# Patient Record
Sex: Female | Born: 1985 | Race: Black or African American | Hispanic: No | Marital: Single | Smoking: Former smoker
Health system: Southern US, Community
[De-identification: ages and names within clinical notes are randomized; demographics above are authoritative.]

## PROBLEM LIST (undated history)

## (undated) ENCOUNTER — Inpatient Hospital Stay (HOSPITAL_COMMUNITY): Payer: Self-pay

## (undated) ENCOUNTER — Inpatient Hospital Stay (HOSPITAL_COMMUNITY): Payer: No Typology Code available for payment source

## (undated) DIAGNOSIS — Z87898 Personal history of other specified conditions: Secondary | ICD-10-CM

## (undated) DIAGNOSIS — D649 Anemia, unspecified: Secondary | ICD-10-CM

## (undated) DIAGNOSIS — Z6791 Unspecified blood type, Rh negative: Secondary | ICD-10-CM

## (undated) DIAGNOSIS — R7989 Other specified abnormal findings of blood chemistry: Secondary | ICD-10-CM

## (undated) DIAGNOSIS — G8929 Other chronic pain: Secondary | ICD-10-CM

## (undated) DIAGNOSIS — F32A Depression, unspecified: Secondary | ICD-10-CM

## (undated) DIAGNOSIS — O139 Gestational [pregnancy-induced] hypertension without significant proteinuria, unspecified trimester: Secondary | ICD-10-CM

## (undated) DIAGNOSIS — E669 Obesity, unspecified: Secondary | ICD-10-CM

## (undated) DIAGNOSIS — Z8719 Personal history of other diseases of the digestive system: Secondary | ICD-10-CM

## (undated) DIAGNOSIS — F1491 Cocaine use, unspecified, in remission: Secondary | ICD-10-CM

## (undated) DIAGNOSIS — F319 Bipolar disorder, unspecified: Secondary | ICD-10-CM

## (undated) DIAGNOSIS — N12 Tubulo-interstitial nephritis, not specified as acute or chronic: Secondary | ICD-10-CM

## (undated) DIAGNOSIS — F329 Major depressive disorder, single episode, unspecified: Secondary | ICD-10-CM

## (undated) DIAGNOSIS — O210 Mild hyperemesis gravidarum: Secondary | ICD-10-CM

## (undated) DIAGNOSIS — B999 Unspecified infectious disease: Secondary | ICD-10-CM

## (undated) DIAGNOSIS — O0990 Supervision of high risk pregnancy, unspecified, unspecified trimester: Secondary | ICD-10-CM

## (undated) DIAGNOSIS — M549 Dorsalgia, unspecified: Secondary | ICD-10-CM

## (undated) DIAGNOSIS — R51 Headache: Secondary | ICD-10-CM

## (undated) DIAGNOSIS — F419 Anxiety disorder, unspecified: Secondary | ICD-10-CM

## (undated) DIAGNOSIS — M419 Scoliosis, unspecified: Secondary | ICD-10-CM

## (undated) DIAGNOSIS — K8020 Calculus of gallbladder without cholecystitis without obstruction: Secondary | ICD-10-CM

## (undated) HISTORY — PX: TONSILLECTOMY AND ADENOIDECTOMY: SHX28

## (undated) HISTORY — DX: Calculus of gallbladder without cholecystitis without obstruction: K80.20

## (undated) HISTORY — DX: Gestational (pregnancy-induced) hypertension without significant proteinuria, unspecified trimester: O13.9

## (undated) HISTORY — DX: Supervision of high risk pregnancy, unspecified, unspecified trimester: O09.90

## (undated) HISTORY — PX: MR WRIST RIGHT: HXRAD1819

## (undated) HISTORY — PX: CHOLECYSTECTOMY: SHX55

## (undated) HISTORY — PX: OTHER SURGICAL HISTORY: SHX169

---
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
## 2006-01-20 HISTORY — PX: DILATION AND CURETTAGE OF UTERUS: SHX78

## 2006-04-04 ENCOUNTER — Inpatient Hospital Stay (HOSPITAL_COMMUNITY): Admission: AD | Admit: 2006-04-04 | Discharge: 2006-04-04 | Payer: Self-pay | Admitting: Obstetrics & Gynecology

## 2006-05-05 ENCOUNTER — Inpatient Hospital Stay (HOSPITAL_COMMUNITY): Admission: AD | Admit: 2006-05-05 | Discharge: 2006-05-06 | Payer: Self-pay | Admitting: Obstetrics & Gynecology

## 2007-03-07 ENCOUNTER — Inpatient Hospital Stay (HOSPITAL_COMMUNITY): Admission: AD | Admit: 2007-03-07 | Discharge: 2007-03-07 | Payer: Self-pay | Admitting: Family Medicine

## 2007-03-09 ENCOUNTER — Inpatient Hospital Stay (HOSPITAL_COMMUNITY): Admission: AD | Admit: 2007-03-09 | Discharge: 2007-03-09 | Payer: Self-pay | Admitting: Obstetrics & Gynecology

## 2007-03-16 ENCOUNTER — Inpatient Hospital Stay (HOSPITAL_COMMUNITY): Admission: AD | Admit: 2007-03-16 | Discharge: 2007-03-16 | Payer: Self-pay | Admitting: Family Medicine

## 2008-04-09 ENCOUNTER — Emergency Department (HOSPITAL_COMMUNITY): Admission: EM | Admit: 2008-04-09 | Discharge: 2008-04-09 | Payer: Self-pay | Admitting: Emergency Medicine

## 2008-04-11 ENCOUNTER — Inpatient Hospital Stay (HOSPITAL_COMMUNITY): Admission: AD | Admit: 2008-04-11 | Discharge: 2008-04-11 | Payer: Self-pay | Admitting: Obstetrics & Gynecology

## 2008-04-11 ENCOUNTER — Ambulatory Visit: Payer: Self-pay | Admitting: Family Medicine

## 2008-04-14 ENCOUNTER — Emergency Department (HOSPITAL_COMMUNITY): Admission: EM | Admit: 2008-04-14 | Discharge: 2008-04-14 | Payer: Self-pay | Admitting: Emergency Medicine

## 2008-05-11 ENCOUNTER — Inpatient Hospital Stay (HOSPITAL_COMMUNITY): Admission: AD | Admit: 2008-05-11 | Discharge: 2008-05-11 | Payer: Self-pay | Admitting: Obstetrics

## 2008-05-23 ENCOUNTER — Inpatient Hospital Stay (HOSPITAL_COMMUNITY): Admission: AD | Admit: 2008-05-23 | Discharge: 2008-05-23 | Payer: Self-pay | Admitting: Obstetrics & Gynecology

## 2008-05-23 ENCOUNTER — Ambulatory Visit: Payer: Self-pay | Admitting: Physician Assistant

## 2008-06-18 ENCOUNTER — Emergency Department (HOSPITAL_COMMUNITY): Admission: EM | Admit: 2008-06-18 | Discharge: 2008-06-18 | Payer: Self-pay | Admitting: Emergency Medicine

## 2008-07-11 ENCOUNTER — Ambulatory Visit (HOSPITAL_COMMUNITY): Admission: RE | Admit: 2008-07-11 | Discharge: 2008-07-11 | Payer: Self-pay | Admitting: Obstetrics

## 2008-08-19 ENCOUNTER — Inpatient Hospital Stay (HOSPITAL_COMMUNITY): Admission: AD | Admit: 2008-08-19 | Discharge: 2008-08-19 | Payer: Self-pay | Admitting: Internal Medicine

## 2008-09-21 ENCOUNTER — Ambulatory Visit (HOSPITAL_COMMUNITY): Admission: AD | Admit: 2008-09-21 | Discharge: 2008-09-21 | Payer: Self-pay | Admitting: Obstetrics

## 2008-10-05 ENCOUNTER — Inpatient Hospital Stay (HOSPITAL_COMMUNITY): Admission: AD | Admit: 2008-10-05 | Discharge: 2008-10-05 | Payer: Self-pay | Admitting: Obstetrics

## 2008-11-17 ENCOUNTER — Inpatient Hospital Stay (HOSPITAL_COMMUNITY): Admission: AD | Admit: 2008-11-17 | Discharge: 2008-11-17 | Payer: Self-pay | Admitting: Obstetrics

## 2008-12-10 ENCOUNTER — Inpatient Hospital Stay (HOSPITAL_COMMUNITY): Admission: AD | Admit: 2008-12-10 | Discharge: 2008-12-10 | Payer: Self-pay | Admitting: Obstetrics & Gynecology

## 2008-12-11 ENCOUNTER — Inpatient Hospital Stay (HOSPITAL_COMMUNITY): Admission: AD | Admit: 2008-12-11 | Discharge: 2008-12-13 | Payer: Self-pay | Admitting: Obstetrics

## 2009-02-17 ENCOUNTER — Emergency Department (HOSPITAL_COMMUNITY): Admission: EM | Admit: 2009-02-17 | Discharge: 2009-02-18 | Payer: Self-pay | Admitting: Emergency Medicine

## 2009-02-17 ENCOUNTER — Emergency Department (HOSPITAL_COMMUNITY): Admission: EM | Admit: 2009-02-17 | Discharge: 2009-02-17 | Payer: Self-pay | Admitting: Emergency Medicine

## 2009-03-03 ENCOUNTER — Emergency Department (HOSPITAL_COMMUNITY): Admission: EM | Admit: 2009-03-03 | Discharge: 2009-03-03 | Payer: Self-pay | Admitting: Emergency Medicine

## 2009-03-29 ENCOUNTER — Emergency Department (HOSPITAL_COMMUNITY): Admission: EM | Admit: 2009-03-29 | Discharge: 2009-03-29 | Payer: Self-pay | Admitting: Emergency Medicine

## 2009-03-30 ENCOUNTER — Emergency Department (HOSPITAL_COMMUNITY): Admission: EM | Admit: 2009-03-30 | Discharge: 2009-03-30 | Payer: Self-pay | Admitting: Emergency Medicine

## 2009-05-06 ENCOUNTER — Inpatient Hospital Stay (HOSPITAL_COMMUNITY): Admission: AD | Admit: 2009-05-06 | Discharge: 2009-05-06 | Payer: Self-pay | Admitting: Family Medicine

## 2009-05-06 ENCOUNTER — Ambulatory Visit: Payer: Self-pay | Admitting: Obstetrics and Gynecology

## 2009-06-13 ENCOUNTER — Inpatient Hospital Stay (HOSPITAL_COMMUNITY): Admission: AD | Admit: 2009-06-13 | Discharge: 2009-06-13 | Payer: Self-pay | Admitting: Obstetrics

## 2009-07-23 ENCOUNTER — Ambulatory Visit: Payer: Self-pay | Admitting: Nurse Practitioner

## 2009-07-23 ENCOUNTER — Inpatient Hospital Stay (HOSPITAL_COMMUNITY): Admission: AD | Admit: 2009-07-23 | Discharge: 2009-07-23 | Payer: Self-pay | Admitting: Obstetrics

## 2009-08-03 ENCOUNTER — Ambulatory Visit (HOSPITAL_COMMUNITY): Admission: RE | Admit: 2009-08-03 | Discharge: 2009-08-03 | Payer: Self-pay | Admitting: Obstetrics

## 2009-12-17 ENCOUNTER — Inpatient Hospital Stay (HOSPITAL_COMMUNITY)
Admission: AD | Admit: 2009-12-17 | Discharge: 2009-12-17 | Payer: Self-pay | Source: Home / Self Care | Admitting: Obstetrics

## 2009-12-21 ENCOUNTER — Inpatient Hospital Stay (HOSPITAL_COMMUNITY)
Admission: AD | Admit: 2009-12-21 | Discharge: 2009-12-21 | Payer: Self-pay | Source: Home / Self Care | Admitting: Obstetrics

## 2009-12-30 ENCOUNTER — Inpatient Hospital Stay (HOSPITAL_COMMUNITY)
Admission: AD | Admit: 2009-12-30 | Discharge: 2010-01-02 | Payer: Self-pay | Source: Home / Self Care | Attending: Obstetrics & Gynecology | Admitting: Obstetrics & Gynecology

## 2009-12-31 ENCOUNTER — Encounter: Payer: Self-pay | Admitting: Obstetrics & Gynecology

## 2010-01-19 ENCOUNTER — Emergency Department (HOSPITAL_COMMUNITY)
Admission: EM | Admit: 2010-01-19 | Discharge: 2010-01-20 | Payer: Self-pay | Source: Home / Self Care | Admitting: Emergency Medicine

## 2010-02-09 ENCOUNTER — Emergency Department (HOSPITAL_COMMUNITY)
Admission: EM | Admit: 2010-02-09 | Discharge: 2010-02-09 | Payer: Medicaid Other | Source: Home / Self Care | Admitting: Emergency Medicine

## 2010-02-12 LAB — URINALYSIS, ROUTINE W REFLEX MICROSCOPIC
Bilirubin Urine: NEGATIVE
Hgb urine dipstick: NEGATIVE
Ketones, ur: NEGATIVE mg/dL
Protein, ur: NEGATIVE mg/dL
Urine Glucose, Fasting: NEGATIVE mg/dL

## 2010-02-12 LAB — DIFFERENTIAL
Basophils Absolute: 0 10*3/uL (ref 0.0–0.1)
Eosinophils Relative: 3 % (ref 0–5)
Lymphocytes Relative: 27 % (ref 12–46)
Neutro Abs: 5.4 10*3/uL (ref 1.7–7.7)

## 2010-02-12 LAB — URINE MICROSCOPIC-ADD ON

## 2010-02-12 LAB — URINE CULTURE: Culture  Setup Time: 201201211732

## 2010-02-12 LAB — COMPREHENSIVE METABOLIC PANEL
ALT: 37 U/L — ABNORMAL HIGH (ref 0–35)
AST: 59 U/L — ABNORMAL HIGH (ref 0–37)
CO2: 24 mEq/L (ref 19–32)
Chloride: 105 mEq/L (ref 96–112)
GFR calc Af Amer: >60 mL/min (ref >60)
GFR calc non Af Amer: >60 mL/min (ref >60)
Sodium: 140 mEq/L (ref 135–145)
Total Bilirubin: 0.3 mg/dL (ref 0.3–1.2)

## 2010-02-12 LAB — CBC
HCT: 36.4 % (ref 36.0–46.0)
RDW: 14.1 % (ref 11.5–15.5)
WBC: 8.6 10*3/uL (ref 4.0–10.5)

## 2010-02-18 ENCOUNTER — Ambulatory Visit (HOSPITAL_COMMUNITY)
Admission: RE | Admit: 2010-02-18 | Discharge: 2010-02-23 | Disposition: A | Discharge status: Home / Self Care | Payer: Medicaid Other | Attending: General Surgery | Admitting: General Surgery

## 2010-02-18 DIAGNOSIS — F17200 Nicotine dependence, unspecified, uncomplicated: Secondary | ICD-10-CM | POA: Insufficient documentation

## 2010-02-18 DIAGNOSIS — K8060 Calculus of gallbladder and bile duct with cholecystitis, unspecified, without obstruction: Secondary | ICD-10-CM | POA: Insufficient documentation

## 2010-02-18 DIAGNOSIS — E669 Obesity, unspecified: Secondary | ICD-10-CM | POA: Insufficient documentation

## 2010-02-18 DIAGNOSIS — K8064 Calculus of gallbladder and bile duct with chronic cholecystitis without obstruction: Secondary | ICD-10-CM | POA: Insufficient documentation

## 2010-02-19 ENCOUNTER — Encounter: Payer: Medicaid Other | Admitting: Gastroenterology

## 2010-02-19 LAB — COMPREHENSIVE METABOLIC PANEL
Albumin: 2.9 g/dL — ABNORMAL LOW (ref 3.5–5.2)
BUN: 8 mg/dL (ref 6–23)
Calcium: 8.8 mg/dL (ref 8.4–10.5)
Creatinine, Ser: 0.74 mg/dL (ref 0.4–1.2)
Potassium: 3.7 mEq/L (ref 3.5–5.1)
Total Protein: 6.3 g/dL (ref 6.0–8.3)

## 2010-02-19 LAB — AMYLASE: Amylase: 32 U/L (ref 0–105)

## 2010-02-20 DIAGNOSIS — K8050 Calculus of bile duct without cholangitis or cholecystitis without obstruction: Secondary | ICD-10-CM

## 2010-02-20 LAB — HEPATIC FUNCTION PANEL
ALT: 160 U/L — ABNORMAL HIGH (ref 0–35)
AST: 149 U/L — ABNORMAL HIGH (ref 0–37)
Albumin: 2.6 g/dL — ABNORMAL LOW (ref 3.5–5.2)
Alkaline Phosphatase: 237 U/L — ABNORMAL HIGH (ref 39–117)
Total Bilirubin: 0.6 mg/dL (ref 0.3–1.2)

## 2010-02-20 NOTE — Op Note (Signed)
NAMESALINA, Jacqueline Clarke        ACCOUNT NO.:  [id number]  MEDICAL RECORD NO.:  [id number]          PATIENT TYPE:  OIB  LOCATION:  1530                         FACILITY:  [facility]  PHYSICIAN:  Anselm Pancoast. Adalynn Corne, M.D.DATE OF BIRTH:  05-Jan-1986  DATE OF PROCEDURE:  02/18/2010 DATE OF DISCHARGE:                              OPERATIVE REPORT   PREOPERATIVE DIAGNOSIS:  Chronic cholecystitis with stones and possible common duct stone.  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  OPERATION:  Laparoscopic cholecystectomy, cholangiogram, and dilatation with the ampulla Fogarty catheter.  ANESTHESIA:  General anesthesia.  HISTORY:  Jacqueline Clarke is a 25 year old female, mother of 2. She is about 6 weeks' postpartum who was seen in the office this past week with the following history.  She said following her first pregnancy which was about 2 years ago, she has had episodes of epigastric pain. Then, she has recently had a second pregnancy, delivering about 8 weeks ago.  She is not nursing. She is still out on maternity leave and had an episode of pain last weekend and not this past 2 days ago, but went to the emergency room where she was evaluated.  They did liver function studies and obtained an ultrasound of the gallbladder.  There was no pericystic fluid but there were numerous stones and sludge within the gallbladder.  She was advised to see me in the office and given appointment, I think I saw her on Wednesday.  On examination in the office, she was not acutely tender but she says she is still having episodes of pain and she will get pain in the epigastric area but she also gets it in the chest and small in the back.  She was not jaundiced and her liver functions test in the emergency room showed a mild bump in the SGOT, SGPT, and I recommended to proceed on with a laparoscopic cholecystectomy and cholangiogram.  Her white count was  not elevated and she first says she was not allergic to any medications but then she is kind of, maybe she had a reaction as a child.  She is 19 months following the first pregnancy and approximately 6 to 7 weeks following the second.  The baby was with her mother in the office and is doing fine and the patient is not nursing.  She is here today.  I did not repeat the liver function studies but she says she has had this pain again over the weekend, not as intense when she went to the emergency room but a similar kind of pain in the back and discomfort after eating. Preoperatively, she was given 400 mg of Cipro as she is possibly allergic to KEFZOL as what she says she had some type of her surgery and had a "reaction and reaction years ago.  She on her cultures of her nose is positive for both MRSA and also Staph aureus, even though she is clinically not having any symptoms regarding her nose.  The patient has PAS stockings.  She weighs about 275 pounds.  She is a large individual, but lot of this is kind of adipose tissue from her recent  pregnancy also.  DESCRIPTION OF PROCEDURE:  The patient was positioned to OR table. Induction of general anesthesia, endotracheal tube, oral tube into the stomach and then the abdomen was prepped with Betadine solution and draped in sterile manner.  I elected to make a little incision above her umbilicus, dissected down in the adipose tissue, identifying the fascia, and then made a little small opening to the fascia which picked up to the left and right sides with Kochers and then carefully entered into the peritoneal cavity.  Pursestring suture of 0 Vicryl placed.  Dr. Ezzard Standing had scrubbed in at this point and then we introduced the camera. The gallbladder itself was collapsed.  You can see the numerous stones within it, but it was not acutely inflamed or distended.  The anatomy is such that you could see the branch of the cystic artery just really  on top of the cystic duct and I elevated it from the cystic duct, doubly clipped it, proximally singly, distally, and then after we encompassed the cystic duct went ahead and divided the artery.  I did put clip across the junction of the cystic duct and gallbladder, made a little opening proximally.  A Cook catheter was introduced into the cystic duct and flows induced after being clipped and then an x-ray was obtained. The bile duct is dilated and I could never get diagonal across the ampulla and we gave [place] some glucagon and still you could not see any diagonal across the ampulla.  Since it looks like it would be fairly easy to get a Fogarty catheter in the distal common bile duct, we used a #4 Fogarty and slipped it in and went past the duodenum easily and then pulled it back, deflated the bleeding and then it slipped across the ampulla.  There were no stones.  It actually came out when we removed the cholangiocatheter. There was little air bubbles and the last injection when we used the magnified views, there was little air bubble in the syringe that I pushed into the duodenum.  We then repeated the cholangiogram.  At this time, you could see the diagonal across the ampulla easily.  It looks like there is 1 stone probably about 2 mm in size that is kind of up in the common bile duct or really hepatic duct and then it actually goes down into the distal common bile duct and then we could never actually see that passing across the ampulla.  The catheter was removed.  In its course, we put the [facility] catheter back in and repeated the cholangiogram after the Fogarty had been removed and we put 3 clips across the cystic duct and divided it.  There was a little posterior branch of the cystic artery was visualized and was doubly clipped proximally and then we used the cautery to free the gallbladder from its bed.  There was good hemostasis.  There was no real spillage of bile except for the  little bit through the cystic duct area and I think I need to repeat her liver test in the morning and then make a decision tomorrow whether she is going to the need an ERCP or not.  Hopefully, she will pass this little stone and liver tests will be normal.  I asked him to culture the bile on the back table after the gallbladder had been removed.  We had placed it into an EndoCatch bag and brought it out without spillage of bile and I am not going to  give her any additional antibiotics unless she is symptomatic.  We put an additional figure-of- eight in the fascia just above the umbilicus, removed the 5-mm ports Dr. Ezzard Standing placed in right upper quadrant under direct vision and then closed the subcutaneous wounds with 4-0 Vicryl, Benzoin and Steri-Strips on the skin.     Anselm Pancoast. Zachery Dakins, M.D.     WJW/MEDQ  D:  02/18/2010  T:  02/18/2010  Job:  [id number]  Electronically Signed by Consuello Bossier M.D. on 02/20/2010 09:34:12 AM

## 2010-02-21 DIAGNOSIS — K8050 Calculus of bile duct without cholangitis or cholecystitis without obstruction: Secondary | ICD-10-CM

## 2010-02-21 LAB — WOUND CULTURE: Culture: NO GROWTH

## 2010-02-21 LAB — CBC
MCHC: 30.3 g/dL (ref 30.0–36.0)
MCV: 88.3 fL (ref 78.0–100.0)
Platelets: 269 10*3/uL (ref 150–400)
RDW: 14.4 % (ref 11.5–15.5)
WBC: 5.4 10*3/uL (ref 4.0–10.5)

## 2010-02-21 LAB — BASIC METABOLIC PANEL
BUN: 4 mg/dL — ABNORMAL LOW (ref 6–23)
CO2: 27 mEq/L (ref 19–32)
Chloride: 107 mEq/L (ref 96–112)
Creatinine, Ser: 0.65 mg/dL (ref 0.4–1.2)

## 2010-02-21 LAB — HEPATIC FUNCTION PANEL
ALT: 120 U/L — ABNORMAL HIGH (ref 0–35)
Alkaline Phosphatase: 210 U/L — ABNORMAL HIGH (ref 39–117)
Indirect Bilirubin: 0.1 mg/dL — ABNORMAL LOW (ref 0.3–0.9)
Total Bilirubin: 0.2 mg/dL — ABNORMAL LOW (ref 0.3–1.2)

## 2010-02-27 NOTE — Procedures (Signed)
Summary: ERCP  Patient: Jacqueline Clarke Note: All result statuses are Final unless otherwise noted.  Tests: (1) ERCP (ERC)   ERC ERCP                  DONE     [facility]     [address] [place], [place]  [address]           ERCP PROCEDURE REPORT           PATIENT:  Jacqueline, Clarke  MR#:  [id number]     BIRTHDATE:  Oct 12, 1985  GENDER:           ENDOSCOPIST:  Barbette Hair. Arlyce Dice, MD     ASSISTANT:           PROCEDURE DATE:  02/19/2010     PROCEDURE:  ERCP with removal of stones, ERCP with sphincterotomy           INDICATIONS:  suspected stone           MEDICATIONS:   MAC sedation, administered by CRNA     TOPICAL ANESTHETIC:           DESCRIPTION OF PROCEDURE:   After the risks benefits and     alternatives of the procedure were thoroughly explained, informed     consent was obtained.  The  endoscope was introduced through the     mouth and advanced to the .           The pancreatic duct was successfully cannulated and filled with     contrast. Care was taken not to overfill the duct. pancreatic duct     was cannulated with a 0.37mm wire. No contrast was injected into     the duct. A single stone was founCBD was selectively cannulated     with a 0.45mm wire. No pbvious filling defects were seen. A 12mm     sphincterotomy was done. The duct was swept with a 12mm balloon     stone extractor and a 5mm stone was delivered into the duodenum.     Futhernsweeping of the duct did not yield any more stones.    The     scope was then completely withdrawn from the patient and the     procedure terminated.           COMPLICATIONS:  None           ENDOSCOPIC IMPRESSION:     1) CBD stone, extracted following sphincterotomy           RECOMMENDATIONS:No further Rx           ______________________________     Barbette Hair. Arlyce Dice, MD           CC:  Consuello Bossier, MD           n.     Rosalie DoctorBarbette Hair. Kaplan at 02/19/2010 09:02 PM        Jacqueline Clarke, [id number]  Note: An exclamation mark (!) indicates a result that was not dispersed into the flowsheet. Document Creation Date: 02/19/2010 9:02 PM _______________________________________________________________________  (1) Order result status: Final Collection or observation date-time: 02/19/2010 14:47 Requested date-time:  Receipt date-time:  Reported date-time:  Referring Physician:   Ordering Physician: Melvia Heaps [id number]) Specimen Source:  Source: Launa Grill Order Number: [id number] Lab site:

## 2010-03-14 NOTE — Discharge Summary (Signed)
Jacqueline Clarke, Jacqueline Clarke        ACCOUNT NO.:  [id number]  MEDICAL RECORD NO.:  [id number]           PATIENT TYPE:  O  LOCATION:  1530                         FACILITY:  [facility]  PHYSICIAN:  Anselm Pancoast. Lorilee Cafarella, M.D.DATE OF BIRTH:  04-13-1985  DATE OF ADMISSION:  02/18/2010 DATE OF DISCHARGE:  02/22/2010                              DISCHARGE SUMMARY   DISCHARGING DIAGNOSES: 1. Chronic cholecystitis with stones and common duct stone. 2. Recent vaginal delivery.  OPERATIONS:  Laparoscopic cholecystectomy with cholangiogram.  On the first postop day, ERCP and sphincterotomy and removal of the common duct stone.  HISTORY:  Jacqueline Clarke is a 25 year old female mother of two who is approximately 6-7 weeks postpartum who was seen in the emergency room of the previous weekend that I saw her with severe epigastric pain.  She said she had had previous little episodes of epigastric pain following her first pregnancy (her first child is about, I think, 61 years old) and she went to the emergency room.  She was evaluated.  They did liver function studies which showed sludge and numerous stones within her gallbladder.  Her SGOT and SGPT were mildly elevated, and I saw her in the office (I think it was on either the Thursday or Friday following her visit to the ER) and she had been on tramadol and Percocet for pain. I arranged to get her on the OR schedule the next available day, which was the following Monday approximately 3 days later.  She had had episodes of mild pain over the weekend, but did not repeat the liver function studies, but she was taken to surgery on Monday and Dr. Ezzard Standing assisted.  She is possibly allergic to KEFZOL, so she was given Cipro. On her preoperative screening, her nose culture was positive for Staph aureus and MRSA.  She was taken to surgery and her laparoscopic cholecystectomy.  The gallbladder had numerous small stones within it. On the original x-rays,  we could not get flowing into the duodenum and I used a #4 biliary Fogarty and slipped it through the ampulla,  kind of stretched it, and the x-ray then you could see dye going into the duodenum; but it looked like there was a single stone up in the hepatic portion of the common bile duct.  We watched this and it went on down, but it did not look like it passed; and whether it would actually pass or not, I was not sure.  She had probably a little more pain postoperatively than usual even though her vital signs were normal, and then the following morning, repeated the liver function studies and they were still mildly abnormal and I called Dr. Arlyce Dice at [facility] and arranged for an ERCP to be performed that day.  The ERCP was performed, with a single 5-mm stone ( I expect it was probably more a 4-mm stone from the size of the bile duct and what we saw at the time of surgery). Postoperatively, she complained of more pain than usual, but her amylase on the day after surgery and also the days following the ERCP havealways been normal.  We have allowed her to request the  pain; she was started off with some morphine and, because of discomfort, she had some Dilaudid and then over the last 24 hours we resumed the Percocet, which she has tolerated.  She had an episode of nausea last night.  I did not check any laboratory studies today, and she had a slow pulse of 60-70 range.  Her blood pressure has been normal, she has been afebrile.  On examination of her abdomen now, the incisions from the surgery were healing nicely.  She is not tender.  She has got good bowel sounds and her lungs were clear.  She thinks she is ready for home and I agree, but I would recommend that we only use one Percocet for pain at a time and not to take the tramadol and the Percocet, which she was taking prior to her coming to the hospital.  She will be seen in the office for a follow-up exam and liver function studies in  approximately 1 week.  She was not seen by the GI people this morning and they thought she was ready for discharge yesterday, and she says she is in agreement that she is ready for discharge today.  She did have a Foley catheter placed about 2 days ago when she was not making a whole lot of urine.  With the present 12 hours, she is making large amounts of urine.  Her electrolytes, BUN are normal, as was her glucose.  The cultures:  When she was seen in the ER they did a urine culture that showed multiple organisms of small numbers.  I cultured the bile at the time of her surgery and it was no growth.  We will plan on seeing her in the office in approximately 1 week and she should be able to care for her baby as far as weight restrictions, lifting, and all without issues.  Her baby and husband have been staying with her the last 2-3 nights and she has been room isolation because of the nose cultures.  She had Cipro at the time of surgery.  I think Dr. Arlyce Dice gave a second dose of antibiotics, but I think she actually had amoxicillin, I think, at that time.     Anselm Pancoast. Zachery Dakins, M.D.     WJW/MEDQ  D:  02/22/2010  T:  02/22/2010  Job:  [id number]  cc:   Barbette Hair. Arlyce Dice, MD,FACG [address] [place] [address]  Electronically Signed by Consuello Bossier M.D. on 03/14/2010 09:41:33 AM

## 2010-04-01 LAB — COMPREHENSIVE METABOLIC PANEL
AST: 173 U/L — ABNORMAL HIGH (ref 0–37)
Albumin: 3.1 g/dL — ABNORMAL LOW (ref 3.5–5.2)
Alkaline Phosphatase: 111 U/L (ref 39–117)
CO2: 27 mEq/L (ref 19–32)
Chloride: 106 mEq/L (ref 96–112)
GFR calc Af Amer: >60 mL/min (ref >60)
GFR calc non Af Amer: >60 mL/min (ref >60)
Potassium: 4 mEq/L (ref 3.5–5.1)
Total Bilirubin: 0.7 mg/dL (ref 0.3–1.2)

## 2010-04-01 LAB — URINALYSIS, ROUTINE W REFLEX MICROSCOPIC
Bilirubin Urine: NEGATIVE
Nitrite: NEGATIVE
Protein, ur: 30 mg/dL — AB
Specific Gravity, Urine: 1.014 (ref 1.005–1.030)
Urobilinogen, UA: 0.2 mg/dL (ref 0.0–1.0)

## 2010-04-01 LAB — RH IMMUNE GLOB WKUP(>/=20WKS)(NOT WOMEN'S HOSP)

## 2010-04-01 LAB — CBC
Hemoglobin: 10.8 g/dL — ABNORMAL LOW (ref 12.0–15.0)
MCHC: 33.1 g/dL (ref 30.0–36.0)
MCV: 86.4 fL (ref 78.0–100.0)
Platelets: 312 10*3/uL (ref 150–400)
Platelets: 171 10*3/uL (ref 150–400)
Platelets: 207 10*3/uL (ref 150–400)
RBC: 3.98 MIL/uL (ref 3.87–5.11)
RBC: 3.25 MIL/uL — ABNORMAL LOW (ref 3.87–5.11)
RDW: 14.9 % (ref 11.5–15.5)
RDW: 15.1 % (ref 11.5–15.5)
WBC: 8.2 10*3/uL (ref 4.0–10.5)
WBC: 14.3 10*3/uL — ABNORMAL HIGH (ref 4.0–10.5)
WBC: 9.1 10*3/uL (ref 4.0–10.5)

## 2010-04-01 LAB — WET PREP, GENITAL
Trich, Wet Prep: NONE SEEN
Yeast Wet Prep HPF POC: NONE SEEN

## 2010-04-01 LAB — URINE MICROSCOPIC-ADD ON

## 2010-04-01 LAB — DIFFERENTIAL
Basophils Absolute: 0 10*3/uL (ref 0.0–0.1)
Basophils Relative: 0 % (ref 0–1)
Eosinophils Absolute: 0.1 10*3/uL (ref 0.0–0.7)
Eosinophils Relative: 2 % (ref 0–5)
Monocytes Absolute: 0.7 10*3/uL (ref 0.1–1.0)

## 2010-04-01 LAB — POCT PREGNANCY, URINE: Preg Test, Ur: NEGATIVE

## 2010-04-01 LAB — LIPASE, BLOOD: Lipase: 24 U/L (ref 11–59)

## 2010-04-02 LAB — RH IMMUNE GLOBULIN WORKUP (NOT WOMEN'S HOSP)
ABO/RH(D): O NEG
Unit division: 0

## 2010-04-07 LAB — URINE MICROSCOPIC-ADD ON

## 2010-04-07 LAB — URINALYSIS, ROUTINE W REFLEX MICROSCOPIC
Bilirubin Urine: NEGATIVE
Glucose, UA: NEGATIVE mg/dL
Ketones, ur: NEGATIVE mg/dL
Leukocytes, UA: NEGATIVE
Nitrite: NEGATIVE
Protein, ur: NEGATIVE mg/dL
Specific Gravity, Urine: 1.023 (ref 1.005–1.030)
pH: 5.5 (ref 5.0–8.0)
pH: 6 (ref 5.0–8.0)

## 2010-04-07 LAB — CBC
HCT: 32.7 % — ABNORMAL LOW (ref 36.0–46.0)
Hemoglobin: 10.9 g/dL — ABNORMAL LOW (ref 12.0–15.0)
MCHC: 33.5 g/dL (ref 30.0–36.0)
MCHC: 32.4 g/dL (ref 30.0–36.0)
MCV: 84.3 fL (ref 78.0–100.0)
RBC: 3.9 MIL/uL (ref 3.87–5.11)
RBC: 4.05 MIL/uL (ref 3.87–5.11)
RDW: 14.9 % (ref 11.5–15.5)
WBC: 6.7 10*3/uL (ref 4.0–10.5)

## 2010-04-07 LAB — COMPREHENSIVE METABOLIC PANEL
ALT: 22 U/L (ref 0–35)
AST: 52 U/L — ABNORMAL HIGH (ref 0–37)
AST: 67 U/L — ABNORMAL HIGH (ref 0–37)
Alkaline Phosphatase: 52 U/L (ref 39–117)
CO2: 23 mEq/L (ref 19–32)
CO2: 27 mEq/L (ref 19–32)
Calcium: 8.8 mg/dL (ref 8.4–10.5)
Calcium: 9.3 mg/dL (ref 8.4–10.5)
Chloride: 103 mEq/L (ref 96–112)
Creatinine, Ser: 0.84 mg/dL (ref 0.4–1.2)
GFR calc Af Amer: >60 mL/min (ref >60)
GFR calc Af Amer: >60 mL/min (ref >60)
GFR calc non Af Amer: >60 mL/min (ref >60)
GFR calc non Af Amer: >60 mL/min (ref >60)
Glucose, Bld: 84 mg/dL (ref 70–99)
Glucose, Bld: 92 mg/dL (ref 70–99)
Potassium: 3.7 mEq/L (ref 3.5–5.1)
Sodium: 134 mEq/L — ABNORMAL LOW (ref 135–145)
Total Protein: 6.7 g/dL (ref 6.0–8.3)

## 2010-04-07 LAB — URINE CULTURE

## 2010-04-07 LAB — WET PREP, GENITAL
Trich, Wet Prep: NONE SEEN
Yeast Wet Prep HPF POC: NONE SEEN

## 2010-04-07 LAB — DIFFERENTIAL
Lymphocytes Relative: 33 % (ref 12–46)
Lymphs Abs: 2.2 10*3/uL (ref 0.7–4.0)
Monocytes Relative: 7 % (ref 3–12)
Neutrophils Relative %: 56 % (ref 43–77)

## 2010-04-07 LAB — LIPASE, BLOOD: Lipase: 30 U/L (ref 11–59)

## 2010-04-07 LAB — AMYLASE: Amylase: 66 U/L (ref 0–105)

## 2010-04-07 LAB — GC/CHLAMYDIA PROBE AMP, GENITAL: GC Probe Amp, Genital: NEGATIVE

## 2010-04-08 LAB — URINALYSIS, ROUTINE W REFLEX MICROSCOPIC
Hgb urine dipstick: NEGATIVE
Nitrite: NEGATIVE
Specific Gravity, Urine: >1.03 — ABNORMAL HIGH (ref 1.005–1.030)
Urobilinogen, UA: 0.2 mg/dL (ref 0.0–1.0)

## 2010-04-08 LAB — URINE MICROSCOPIC-ADD ON

## 2010-04-09 LAB — CBC
MCV: 82.8 fL (ref 78.0–100.0)
Platelets: 235 10*3/uL (ref 150–400)
RDW: 14 % (ref 11.5–15.5)
WBC: 4.6 10*3/uL (ref 4.0–10.5)

## 2010-04-09 LAB — HCG, QUANTITATIVE, PREGNANCY: hCG, Beta Chain, Quant, S: 24036 m[IU]/mL — ABNORMAL HIGH (ref <5)

## 2010-04-09 LAB — URINALYSIS, ROUTINE W REFLEX MICROSCOPIC
Glucose, UA: NEGATIVE mg/dL
Hgb urine dipstick: NEGATIVE
Ketones, ur: 40 mg/dL — AB
Protein, ur: NEGATIVE mg/dL

## 2010-04-09 LAB — URINE MICROSCOPIC-ADD ON

## 2010-04-09 LAB — ABO/RH: ABO/RH(D): O NEG

## 2010-04-09 LAB — WET PREP, GENITAL

## 2010-04-09 LAB — GC/CHLAMYDIA PROBE AMP, GENITAL: Chlamydia, DNA Probe: NEGATIVE

## 2010-04-10 LAB — URINALYSIS, ROUTINE W REFLEX MICROSCOPIC
Glucose, UA: NEGATIVE mg/dL
Ketones, ur: NEGATIVE mg/dL
Specific Gravity, Urine: 1.028 (ref 1.005–1.030)
pH: 6 (ref 5.0–8.0)

## 2010-04-10 LAB — WET PREP, GENITAL
WBC, Wet Prep HPF POC: NONE SEEN
Yeast Wet Prep HPF POC: NONE SEEN

## 2010-04-10 LAB — GC/CHLAMYDIA PROBE AMP, GENITAL
Chlamydia, DNA Probe: NEGATIVE
GC Probe Amp, Genital: NEGATIVE

## 2010-04-14 LAB — URINALYSIS, ROUTINE W REFLEX MICROSCOPIC
Bilirubin Urine: NEGATIVE
Glucose, UA: NEGATIVE mg/dL
Hgb urine dipstick: NEGATIVE
Ketones, ur: 40 mg/dL — AB
Nitrite: NEGATIVE
Specific Gravity, Urine: 1.024 (ref 1.005–1.030)
pH: 6 (ref 5.0–8.0)

## 2010-04-24 LAB — COMPREHENSIVE METABOLIC PANEL
ALT: 10 U/L (ref 0–35)
Alkaline Phosphatase: 192 U/L — ABNORMAL HIGH (ref 39–117)
BUN: 7 mg/dL (ref 6–23)
Chloride: 106 mEq/L (ref 96–112)
Glucose, Bld: 84 mg/dL (ref 70–99)
Potassium: 4.1 mEq/L (ref 3.5–5.1)
Sodium: 134 mEq/L — ABNORMAL LOW (ref 135–145)
Total Bilirubin: 0.3 mg/dL (ref 0.3–1.2)
Total Protein: 6.3 g/dL (ref 6.0–8.3)

## 2010-04-24 LAB — LACTATE DEHYDROGENASE: LDH: 148 U/L (ref 94–250)

## 2010-04-24 LAB — CBC
HCT: 32.3 % — ABNORMAL LOW (ref 36.0–46.0)
Hemoglobin: 10.5 g/dL — ABNORMAL LOW (ref 12.0–15.0)
Hemoglobin: 9.6 g/dL — ABNORMAL LOW (ref 12.0–15.0)
MCHC: 32.3 g/dL (ref 30.0–36.0)
MCV: 86.2 fL (ref 78.0–100.0)
RBC: 3.46 MIL/uL — ABNORMAL LOW (ref 3.87–5.11)
RBC: 3.78 MIL/uL — ABNORMAL LOW (ref 3.87–5.11)
RDW: 14.3 % (ref 11.5–15.5)
RDW: 14.5 % (ref 11.5–15.5)
WBC: 7.3 10*3/uL (ref 4.0–10.5)

## 2010-04-24 LAB — URINALYSIS, ROUTINE W REFLEX MICROSCOPIC
Bilirubin Urine: NEGATIVE
Glucose, UA: NEGATIVE mg/dL
Specific Gravity, Urine: 1.02 (ref 1.005–1.030)
pH: 7 (ref 5.0–8.0)

## 2010-04-24 LAB — URIC ACID: Uric Acid, Serum: 5.3 mg/dL (ref 2.4–7.0)

## 2010-04-24 LAB — URINE MICROSCOPIC-ADD ON

## 2010-04-28 LAB — WET PREP, GENITAL
Clue Cells Wet Prep HPF POC: NONE SEEN
Trich, Wet Prep: NONE SEEN

## 2010-04-28 LAB — URINALYSIS, ROUTINE W REFLEX MICROSCOPIC
Glucose, UA: NEGATIVE mg/dL
Ketones, ur: 15 mg/dL — AB
Protein, ur: NEGATIVE mg/dL
Urobilinogen, UA: 0.2 mg/dL (ref 0.0–1.0)

## 2010-04-28 LAB — GC/CHLAMYDIA PROBE AMP, GENITAL: Chlamydia, DNA Probe: NEGATIVE

## 2010-04-28 LAB — URINE MICROSCOPIC-ADD ON

## 2010-04-30 LAB — URINALYSIS, ROUTINE W REFLEX MICROSCOPIC
Glucose, UA: NEGATIVE mg/dL
Leukocytes, UA: NEGATIVE
Specific Gravity, Urine: >1.03 — ABNORMAL HIGH (ref 1.005–1.030)

## 2010-04-30 LAB — URINE MICROSCOPIC-ADD ON

## 2010-04-30 LAB — CBC
HCT: 35.3 % — ABNORMAL LOW (ref 36.0–46.0)
MCHC: 34 g/dL (ref 30.0–36.0)
Platelets: 200 10*3/uL (ref 150–400)
RDW: 13.5 % (ref 11.5–15.5)

## 2010-04-30 LAB — GC/CHLAMYDIA PROBE AMP, GENITAL
Chlamydia, DNA Probe: NEGATIVE
GC Probe Amp, Genital: NEGATIVE

## 2010-04-30 LAB — TROPONIN I: Troponin I: 0.02 ng/mL (ref 0.00–0.06)

## 2010-04-30 LAB — CK TOTAL AND CKMB (NOT AT ARMC)
CK, MB: 0.6 ng/mL (ref 0.3–4.0)
Relative Index: INVALID (ref 0.0–2.5)

## 2010-04-30 LAB — WET PREP, GENITAL

## 2010-05-01 LAB — WET PREP, GENITAL
Trich, Wet Prep: NONE SEEN
Yeast Wet Prep HPF POC: NONE SEEN

## 2010-05-01 LAB — URINE MICROSCOPIC-ADD ON

## 2010-05-01 LAB — URINALYSIS, ROUTINE W REFLEX MICROSCOPIC
Bilirubin Urine: NEGATIVE
Nitrite: NEGATIVE
Protein, ur: NEGATIVE mg/dL
Specific Gravity, Urine: >1.03 — ABNORMAL HIGH (ref 1.005–1.030)
Urobilinogen, UA: 0.2 mg/dL (ref 0.0–1.0)

## 2010-05-01 LAB — CBC
HCT: 33.8 % — ABNORMAL LOW (ref 36.0–46.0)
Hemoglobin: 11.4 g/dL — ABNORMAL LOW (ref 12.0–15.0)
MCV: 87.6 fL (ref 78.0–100.0)
Platelets: 190 10*3/uL (ref 150–400)
RBC: 3.86 MIL/uL — ABNORMAL LOW (ref 3.87–5.11)
WBC: 6.6 10*3/uL (ref 4.0–10.5)

## 2010-05-01 LAB — GC/CHLAMYDIA PROBE AMP, GENITAL: Chlamydia, DNA Probe: NEGATIVE

## 2010-05-02 LAB — RHOGAM INJECTION

## 2010-05-02 LAB — URINALYSIS, ROUTINE W REFLEX MICROSCOPIC
Bilirubin Urine: NEGATIVE
Bilirubin Urine: NEGATIVE
Glucose, UA: NEGATIVE mg/dL
Hgb urine dipstick: NEGATIVE
Ketones, ur: NEGATIVE mg/dL
Nitrite: NEGATIVE
Protein, ur: NEGATIVE mg/dL
Specific Gravity, Urine: 1.018 (ref 1.005–1.030)
Urobilinogen, UA: 0.2 mg/dL (ref 0.0–1.0)
pH: 6 (ref 5.0–8.0)

## 2010-05-02 LAB — WET PREP, GENITAL: Yeast Wet Prep HPF POC: NONE SEEN

## 2010-05-02 LAB — HCG, QUANTITATIVE, PREGNANCY
hCG, Beta Chain, Quant, S: 10507 m[IU]/mL — ABNORMAL HIGH (ref <5)
hCG, Beta Chain, Quant, S: 32728 m[IU]/mL — ABNORMAL HIGH (ref <5)
hCG, Beta Chain, Quant, S: 17567 m[IU]/mL — ABNORMAL HIGH (ref <5)

## 2010-05-02 LAB — URINE CULTURE

## 2010-05-02 LAB — URINE MICROSCOPIC-ADD ON

## 2010-05-02 LAB — GC/CHLAMYDIA PROBE AMP, GENITAL: Chlamydia, DNA Probe: POSITIVE — AB

## 2010-05-02 LAB — POCT PREGNANCY, URINE: Preg Test, Ur: POSITIVE

## 2010-05-02 LAB — ABO/RH: ABO/RH(D): O NEG

## 2010-07-29 ENCOUNTER — Inpatient Hospital Stay (INDEPENDENT_AMBULATORY_CARE_PROVIDER_SITE_OTHER)
Admission: RE | Admit: 2010-07-29 | Discharge: 2010-07-29 | Disposition: A | Discharge status: Home / Self Care | Payer: Self-pay | Source: Ambulatory Visit | Attending: Family Medicine | Admitting: Family Medicine

## 2010-07-29 DIAGNOSIS — S61209A Unspecified open wound of unspecified finger without damage to nail, initial encounter: Secondary | ICD-10-CM

## 2010-08-19 ENCOUNTER — Emergency Department (HOSPITAL_COMMUNITY): Payer: Medicaid Other

## 2010-08-19 ENCOUNTER — Emergency Department (HOSPITAL_COMMUNITY)
Admission: EM | Admit: 2010-08-19 | Discharge: 2010-08-19 | Disposition: A | Discharge status: Home / Self Care | Payer: Medicaid Other | Attending: Emergency Medicine | Admitting: Emergency Medicine

## 2010-08-19 DIAGNOSIS — R070 Pain in throat: Secondary | ICD-10-CM | POA: Insufficient documentation

## 2010-08-19 DIAGNOSIS — R0602 Shortness of breath: Secondary | ICD-10-CM | POA: Insufficient documentation

## 2010-08-19 DIAGNOSIS — N390 Urinary tract infection, site not specified: Secondary | ICD-10-CM | POA: Insufficient documentation

## 2010-08-19 DIAGNOSIS — J45909 Unspecified asthma, uncomplicated: Secondary | ICD-10-CM | POA: Insufficient documentation

## 2010-08-19 DIAGNOSIS — R079 Chest pain, unspecified: Secondary | ICD-10-CM | POA: Insufficient documentation

## 2010-08-19 DIAGNOSIS — R0989 Other specified symptoms and signs involving the circulatory and respiratory systems: Secondary | ICD-10-CM | POA: Insufficient documentation

## 2010-08-19 DIAGNOSIS — R05 Cough: Secondary | ICD-10-CM | POA: Insufficient documentation

## 2010-08-19 DIAGNOSIS — J3489 Other specified disorders of nose and nasal sinuses: Secondary | ICD-10-CM | POA: Insufficient documentation

## 2010-08-19 DIAGNOSIS — R059 Cough, unspecified: Secondary | ICD-10-CM | POA: Insufficient documentation

## 2010-08-19 DIAGNOSIS — R350 Frequency of micturition: Secondary | ICD-10-CM | POA: Insufficient documentation

## 2010-08-19 DIAGNOSIS — R0609 Other forms of dyspnea: Secondary | ICD-10-CM | POA: Insufficient documentation

## 2010-08-19 DIAGNOSIS — R300 Dysuria: Secondary | ICD-10-CM | POA: Insufficient documentation

## 2010-08-19 LAB — URINALYSIS, ROUTINE W REFLEX MICROSCOPIC
Hgb urine dipstick: NEGATIVE
Nitrite: NEGATIVE
Protein, ur: NEGATIVE mg/dL
Specific Gravity, Urine: 1.027 (ref 1.005–1.030)
Urobilinogen, UA: 0.2 mg/dL (ref 0.0–1.0)

## 2010-08-19 LAB — URINE MICROSCOPIC-ADD ON

## 2010-08-19 LAB — POCT PREGNANCY, URINE: Preg Test, Ur: NEGATIVE

## 2010-10-11 LAB — URINALYSIS, ROUTINE W REFLEX MICROSCOPIC
Ketones, ur: NEGATIVE
Leukocytes, UA: NEGATIVE
Nitrite: NEGATIVE
Protein, ur: NEGATIVE
Urobilinogen, UA: 0.2

## 2010-10-11 LAB — URINE MICROSCOPIC-ADD ON

## 2010-10-11 LAB — RH IMMUNE GLOBULIN WORKUP (NOT WOMEN'S HOSP)
ABO/RH(D): O NEG
Antibody Screen: NEGATIVE

## 2010-10-11 LAB — WET PREP, GENITAL
Trich, Wet Prep: NONE SEEN
Yeast Wet Prep HPF POC: NONE SEEN

## 2010-10-11 LAB — CBC
Hemoglobin: 11.8 — ABNORMAL LOW
MCV: 84.1
RBC: 4.1
WBC: 6.1

## 2010-10-11 LAB — RPR: RPR Ser Ql: NONREACTIVE

## 2010-10-11 LAB — HCG, QUANTITATIVE, PREGNANCY: hCG, Beta Chain, Quant, S: 122 — ABNORMAL HIGH

## 2010-10-11 LAB — POCT PREGNANCY, URINE: Preg Test, Ur: POSITIVE

## 2010-10-25 ENCOUNTER — Emergency Department (HOSPITAL_COMMUNITY)
Admission: EM | Admit: 2010-10-25 | Discharge: 2010-10-26 | Disposition: A | Discharge status: Home / Self Care | Payer: Medicaid Other | Attending: Emergency Medicine | Admitting: Emergency Medicine

## 2010-10-25 DIAGNOSIS — J029 Acute pharyngitis, unspecified: Secondary | ICD-10-CM | POA: Insufficient documentation

## 2010-10-25 DIAGNOSIS — R059 Cough, unspecified: Secondary | ICD-10-CM | POA: Insufficient documentation

## 2010-10-25 DIAGNOSIS — R05 Cough: Secondary | ICD-10-CM | POA: Insufficient documentation

## 2010-10-25 DIAGNOSIS — B9789 Other viral agents as the cause of diseases classified elsewhere: Secondary | ICD-10-CM | POA: Insufficient documentation

## 2010-10-26 ENCOUNTER — Emergency Department (HOSPITAL_COMMUNITY): Payer: Medicaid Other

## 2010-10-26 LAB — BASIC METABOLIC PANEL
CO2: 27 mEq/L (ref 19–32)
Calcium: 9.8 mg/dL (ref 8.4–10.5)
Creatinine, Ser: 0.73 mg/dL (ref 0.50–1.10)
GFR calc non Af Amer: >90 mL/min (ref >90)

## 2010-10-26 LAB — URINE MICROSCOPIC-ADD ON

## 2010-10-26 LAB — RAPID STREP SCREEN (MED CTR MEBANE ONLY): Streptococcus, Group A Screen (Direct): NEGATIVE

## 2010-10-26 LAB — CBC
MCH: 25.4 pg — ABNORMAL LOW (ref 26.0–34.0)
MCHC: 32.4 g/dL (ref 30.0–36.0)
MCV: 78.4 fL (ref 78.0–100.0)
Platelets: 294 10*3/uL (ref 150–400)
RBC: 4.53 MIL/uL (ref 3.87–5.11)

## 2010-10-26 LAB — DIFFERENTIAL
Eosinophils Absolute: 0.1 10*3/uL (ref 0.0–0.7)
Eosinophils Relative: 0 % (ref 0–5)
Lymphs Abs: 1.6 10*3/uL (ref 0.7–4.0)
Monocytes Absolute: 1.1 10*3/uL — ABNORMAL HIGH (ref 0.1–1.0)
Monocytes Relative: 8 % (ref 3–12)
Neutrophils Relative %: 80 % — ABNORMAL HIGH (ref 43–77)

## 2010-10-26 LAB — URINALYSIS, ROUTINE W REFLEX MICROSCOPIC
Glucose, UA: NEGATIVE mg/dL
Hgb urine dipstick: NEGATIVE
Ketones, ur: NEGATIVE mg/dL
Protein, ur: NEGATIVE mg/dL

## 2010-11-18 ENCOUNTER — Emergency Department (HOSPITAL_COMMUNITY)
Admission: EM | Admit: 2010-11-18 | Discharge: 2010-11-19 | Disposition: A | Discharge status: Home / Self Care | Payer: Medicaid Other | Attending: Emergency Medicine | Admitting: Emergency Medicine

## 2010-11-18 DIAGNOSIS — R112 Nausea with vomiting, unspecified: Secondary | ICD-10-CM | POA: Insufficient documentation

## 2010-11-18 DIAGNOSIS — R21 Rash and other nonspecific skin eruption: Secondary | ICD-10-CM | POA: Insufficient documentation

## 2010-11-18 DIAGNOSIS — L298 Other pruritus: Secondary | ICD-10-CM | POA: Insufficient documentation

## 2010-11-18 DIAGNOSIS — R630 Anorexia: Secondary | ICD-10-CM | POA: Insufficient documentation

## 2010-11-18 DIAGNOSIS — L259 Unspecified contact dermatitis, unspecified cause: Secondary | ICD-10-CM | POA: Insufficient documentation

## 2010-11-18 DIAGNOSIS — L2989 Other pruritus: Secondary | ICD-10-CM | POA: Insufficient documentation

## 2010-11-18 DIAGNOSIS — O9989 Other specified diseases and conditions complicating pregnancy, childbirth and the puerperium: Secondary | ICD-10-CM | POA: Insufficient documentation

## 2010-11-19 ENCOUNTER — Ambulatory Visit: Payer: Medicaid Other

## 2010-11-19 LAB — URINALYSIS, ROUTINE W REFLEX MICROSCOPIC
Bilirubin Urine: NEGATIVE
Hgb urine dipstick: NEGATIVE
Ketones, ur: NEGATIVE mg/dL
Protein, ur: NEGATIVE mg/dL
Urobilinogen, UA: 1 mg/dL (ref 0.0–1.0)

## 2010-11-19 LAB — CBC
HCT: 33.5 % — ABNORMAL LOW (ref 36.0–46.0)
Hemoglobin: 10.5 g/dL — ABNORMAL LOW (ref 12.0–15.0)
RDW: 15.9 % — ABNORMAL HIGH (ref 11.5–15.5)
WBC: 8.7 10*3/uL (ref 4.0–10.5)

## 2010-11-19 LAB — DIFFERENTIAL
Eosinophils Relative: 3 % (ref 0–5)
Monocytes Relative: 6 % (ref 3–12)
Neutrophils Relative %: 54 % (ref 43–77)

## 2010-11-19 LAB — URINE MICROSCOPIC-ADD ON

## 2010-11-21 ENCOUNTER — Encounter (HOSPITAL_COMMUNITY): Payer: Self-pay | Admitting: *Deleted

## 2010-11-21 ENCOUNTER — Ambulatory Visit: Payer: Medicaid Other | Attending: Family Medicine | Admitting: Physical Therapy

## 2010-11-21 ENCOUNTER — Inpatient Hospital Stay (HOSPITAL_COMMUNITY)
Admission: AD | Admit: 2010-11-21 | Discharge: 2010-11-21 | Disposition: A | Discharge status: Home / Self Care | Payer: Medicaid Other | Source: Ambulatory Visit | Attending: Obstetrics and Gynecology | Admitting: Obstetrics and Gynecology

## 2010-11-21 ENCOUNTER — Inpatient Hospital Stay (HOSPITAL_COMMUNITY): Payer: Medicaid Other

## 2010-11-21 DIAGNOSIS — R102 Pelvic and perineal pain unspecified side: Secondary | ICD-10-CM

## 2010-11-21 DIAGNOSIS — Z1389 Encounter for screening for other disorder: Secondary | ICD-10-CM

## 2010-11-21 DIAGNOSIS — IMO0001 Reserved for inherently not codable concepts without codable children: Secondary | ICD-10-CM | POA: Insufficient documentation

## 2010-11-21 DIAGNOSIS — N949 Unspecified condition associated with female genital organs and menstrual cycle: Secondary | ICD-10-CM

## 2010-11-21 DIAGNOSIS — O210 Mild hyperemesis gravidarum: Secondary | ICD-10-CM | POA: Insufficient documentation

## 2010-11-21 DIAGNOSIS — O26899 Other specified pregnancy related conditions, unspecified trimester: Secondary | ICD-10-CM

## 2010-11-21 DIAGNOSIS — Z3490 Encounter for supervision of normal pregnancy, unspecified, unspecified trimester: Secondary | ICD-10-CM

## 2010-11-21 DIAGNOSIS — O99891 Other specified diseases and conditions complicating pregnancy: Secondary | ICD-10-CM | POA: Insufficient documentation

## 2010-11-21 HISTORY — DX: Scoliosis, unspecified: M41.9

## 2010-11-21 LAB — GC/CHLAMYDIA PROBE AMP, GENITAL
Chlamydia, DNA Probe: NEGATIVE
GC Probe Amp, Genital: NEGATIVE

## 2010-11-21 LAB — DIFFERENTIAL
Basophils Absolute: 0 10*3/uL (ref 0.0–0.1)
Basophils Relative: 0 % (ref 0–1)
Eosinophils Absolute: 0.5 10*3/uL (ref 0.0–0.7)
Monocytes Absolute: 1.1 10*3/uL — ABNORMAL HIGH (ref 0.1–1.0)
Neutro Abs: 4.7 10*3/uL (ref 1.7–7.7)

## 2010-11-21 LAB — CBC
HCT: 34.4 % — ABNORMAL LOW (ref 36.0–46.0)
MCH: 26 pg (ref 26.0–34.0)
MCHC: 32 g/dL (ref 30.0–36.0)
RDW: 16.3 % — ABNORMAL HIGH (ref 11.5–15.5)

## 2010-11-21 LAB — WET PREP, GENITAL: Yeast Wet Prep HPF POC: NONE SEEN

## 2010-11-21 MED ORDER — PROMETHAZINE HCL 25 MG PO TABS: 25.0000 mg | ORAL_TABLET | Freq: Four times a day (QID) | ORAL | Status: DC | PRN

## 2010-11-21 NOTE — Progress Notes (Signed)
CHART HAS MRSA - FLAGGED AND  FLAGGED ON COMPUTER- ALTHOUGH PT DENIES

## 2010-11-21 NOTE — Progress Notes (Signed)
SSE done per NP. Wet prep and cultures collected. VE done.   

## 2010-11-21 NOTE — Progress Notes (Signed)
Mayer Camel, NP at bedside.  [place] and lab results discussed with pt.

## 2010-11-21 NOTE — Progress Notes (Signed)
PT SAYS -  DID PREG TEST IN SEPT- NEG.  DID NOT DO 1 IN OCT.  .  NO BIRTH CONTROL.       SAYS VOMITING - SINCE X2 WEEKS- NOT EVERYDAY.  HAS NOT CALLED [facility]- PLANS TO GET [facility] THERE IF PREG. .   CRAMPS STARTED  4 WEEKS AGO-   HER BACK DR - DR VOTEX-  GAVE HER PERCOCET  FOR BACK PAIN- FOR SCOLOSIS AND CRACKED DISC.     TOOK PERCOCET- 1 WEEK AGO- HAS NO MORE.Marland Kitchen   TOOK [facility] LAST ON  Sunday.

## 2010-11-21 NOTE — Progress Notes (Signed)
WAS AT [facility] LAST NIGHT -  HAD POST PREG TEST.

## 2010-11-21 NOTE — ED Provider Notes (Signed)
History     CSN: [id number] Arrival date & time: 11/21/2010  1:31 AM   None     No chief complaint on file.   HPI Jacqueline Clarke is a 25 y.o. female who presents to [facility]U for lower abdominal cramping, nausea and pregnancy symptoms. She was evaluated at [facility] last night for nausea and a rash. She had a positive pregnancy test. Given Rx for hydrocortisone cream for the rash. Normal CBC and urine. The history was provided by the patient and the chart from the ED visit.  No past medical history on file.  No past surgical history on file.  No family history on file.  History  Substance Use Topics  . Smoking status: Not on file  . Smokeless tobacco: Not on file  . Alcohol Use: Not on file    OB History    Grav Para Term Preterm Abortions TAB SAB Ect Mult Living   4    2  2   2       Review of Systems  Constitutional: Positive for fatigue. Negative for fever, chills and diaphoresis.  HENT: Negative for ear pain, congestion, sore throat, facial swelling, neck pain, neck stiffness, dental problem and sinus pressure.   Eyes: Negative for photophobia, pain and discharge.  Respiratory: Negative for cough, chest tightness and wheezing.   Cardiovascular: Negative.   Gastrointestinal: Positive for nausea, vomiting and abdominal pain. Negative for diarrhea, constipation and abdominal distention.  Genitourinary: Positive for frequency, vaginal discharge and pelvic pain. Negative for dysuria, flank pain, vaginal bleeding and difficulty urinating.  Musculoskeletal: Negative for myalgias, back pain and gait problem.  Skin: Negative for color change and rash.  Neurological: Negative for dizziness, speech difficulty, weakness, light-headedness, numbness and headaches.  Psychiatric/Behavioral: Negative for confusion and agitation. The patient is not nervous/anxious.     Allergies  Review of patient's allergies indicates not on file.  Home Medications  No current outpatient prescriptions  on file.  BP 128/80  Temp(Src) 99.5 F (37.5 C) (Oral)  Resp 20  Ht 5\' 8"  (1.727 m)  Wt 260 lb (117.935 kg)  BMI 39.53 kg/m2  LMP 10/10/2010  Physical Exam  Nursing note and vitals reviewed. Constitutional: She is oriented to person, place, and time.       Obese A/A female  HENT:  Head: Normocephalic.  Eyes: EOM are normal.  Neck: Neck supple.  Pulmonary/Chest: Effort normal.  Abdominal: Soft. There is no tenderness.       obese  Genitourinary:       External genitalia without lesions. White vaginal discharge. Cervix inflamed, closed, non tender. Uterus without palpable enlargement. Exam limited due to patient habitus.   Musculoskeletal: Normal range of motion.  Neurological: She is alert and oriented to person, place, and time. No cranial nerve deficit.  Skin:       Left forearm with appearance of insect bite with allergic reaction. similar area to upper arm.   Results for orders placed during the hospital encounter of 11/21/10 (from the past 24 hour(s))  CBC     Status: Abnormal   Collection Time   11/21/10  1:45 AM      Component Value Range   WBC 9.9  4.0 - 10.5 (K/uL)   RBC 4.23  3.87 - 5.11 (MIL/uL)   Hemoglobin 11.0 (*) 12.0 - 15.0 (g/dL)   HCT 40.9 (*) 81.1 - 46.0 (%)   MCV 81.3  78.0 - 100.0 (fL)   MCH 26.0  26.0 - 34.0 (pg)  MCHC 32.0  30.0 - 36.0 (g/dL)   RDW 11.9 (*) 14.7 - 15.5 (%)   Platelets 306  150 - 400 (K/uL)  DIFFERENTIAL     Status: Abnormal   Collection Time   11/21/10  1:45 AM      Component Value Range   Neutrophils Relative 48  43 - 77 (%)   Neutro Abs 4.7  1.7 - 7.7 (K/uL)   Lymphocytes Relative 36  12 - 46 (%)   Lymphs Abs 3.5  0.7 - 4.0 (K/uL)   Monocytes Relative 11  3 - 12 (%)   Monocytes Absolute 1.1 (*) 0.1 - 1.0 (K/uL)   Eosinophils Relative 6 (*) 0 - 5 (%)   Eosinophils Absolute 0.5  0.0 - 0.7 (K/uL)   Basophils Relative 0  0 - 1 (%)   Basophils Absolute 0.0  0.0 - 0.1 (K/uL)  ABO/RH     Status: Normal   Collection Time    11/21/10  1:45 AM      Component Value Range   ABO/RH(D) O NEG     US Ob Comp Less 14 Wks  11/21/2010  *RADIOLOGY REPORT*  Clinical Data: Early pregnancy with pain and cramping.  Estimated gestational age by LMP is 6 weeks 0 days.  OBSTETRIC <14 WK [place] AND TRANSVAGINAL OB US  Technique:  Both transabdominal and transvaginal ultrasound examinations were performed for complete evaluation of the gestation as well as the maternal uterus, adnexal regions, and pelvic cul-de-sac.  Transvaginal technique was performed to assess early pregnancy.  Comparison:  None.  Intrauterine gestational sac:  A single ovoid gestational sac is demonstrated within the uterine fundus. Yolk sac: Present Embryo: Not visualized Cardiac Activity: Not visualized Heart Rate: N/A bpm  MSD: 9.5  mm  5    w 4    d         [place] EDC: 07/20/2011  Maternal uterus/adnexae: No myometrial masses.  The right ovary measures 2.9 x 2 x 1.9 cm and is normal follicular changes.  Probable small hemorrhagic cyst. The left ovary measures 2.5 x 0.9 x 1.5 cm and is normal follicular changes.  No abnormal adnexal mass lesions.  Minimal free fluid in the pelvis.  IMPRESSION: Single intrauterine gestational sac.  Yolk sac is visualized. Estimated gestational age by mean sac diameter is 5 weeks 4 days. The fetal pole is not visualized, likely due to the early gestational age.  Recommend follow-up with serial quantitative beta HCG levels and / or short-term ultrasound followup in 2-3 weeks as clinically indicated.  Original Report Authenticated By: Marlon Pel, M.D.   US Ob Transvaginal  11/21/2010  *RADIOLOGY REPORT*  Clinical Data: Early pregnancy with pain and cramping.  Estimated gestational age by LMP is 6 weeks 0 days.  OBSTETRIC <14 WK [place] AND TRANSVAGINAL OB US  Technique:  Both transabdominal and transvaginal ultrasound examinations were performed for complete evaluation of the gestation as well as the maternal uterus, adnexal regions, and pelvic  cul-de-sac.  Transvaginal technique was performed to assess early pregnancy.  Comparison:  None.  Intrauterine gestational sac:  A single ovoid gestational sac is demonstrated within the uterine fundus. Yolk sac: Present Embryo: Not visualized Cardiac Activity: Not visualized Heart Rate: N/A bpm  MSD: 9.5  mm  5    w 4    d         [place] EDC: 07/20/2011  Maternal uterus/adnexae: No myometrial masses.  The right ovary measures 2.9 x 2 x 1.9  cm and is normal follicular changes.  Probable small hemorrhagic cyst. The left ovary measures 2.5 x 0.9 x 1.5 cm and is normal follicular changes.  No abnormal adnexal mass lesions.  Minimal free fluid in the pelvis.  IMPRESSION: Single intrauterine gestational sac.  Yolk sac is visualized. Estimated gestational age by mean sac diameter is 5 weeks 4 days. The fetal pole is not visualized, likely due to the early gestational age.  Recommend follow-up with serial quantitative beta HCG levels and / or short-term ultrasound followup in 2-3 weeks as clinically indicated.  Original Report Authenticated By: Marlon Pel, M.D.   Assessment: First trimester nausea    Probable insect bites to right arm with allergic reaction  Plan:  Phenergan for nausea   Continue the cortisone cream for the allergic reaction   Benadryl for itching.   Start prenatal care  ED Course  Procedures   MDM   [facility], NP 11/21/10 0259  [place], NP 11/21/10 [phone]

## 2010-11-22 ENCOUNTER — Ambulatory Visit (HOSPITAL_COMMUNITY): Payer: Medicaid Other | Admitting: Psychology

## 2010-11-25 ENCOUNTER — Encounter (HOSPITAL_COMMUNITY): Payer: Self-pay

## 2010-11-25 ENCOUNTER — Inpatient Hospital Stay (HOSPITAL_COMMUNITY)
Admission: AD | Admit: 2010-11-25 | Discharge: 2010-11-25 | Disposition: A | Discharge status: Home / Self Care | Payer: Medicaid Other | Source: Ambulatory Visit | Attending: Obstetrics & Gynecology | Admitting: Obstetrics & Gynecology

## 2010-11-25 DIAGNOSIS — O210 Mild hyperemesis gravidarum: Secondary | ICD-10-CM | POA: Insufficient documentation

## 2010-11-25 DIAGNOSIS — R1110 Vomiting, unspecified: Secondary | ICD-10-CM

## 2010-11-25 DIAGNOSIS — R1115 Cyclical vomiting syndrome unrelated to migraine: Secondary | ICD-10-CM

## 2010-11-25 LAB — URINE MICROSCOPIC-ADD ON

## 2010-11-25 LAB — URINALYSIS, ROUTINE W REFLEX MICROSCOPIC
Bilirubin Urine: NEGATIVE
Glucose, UA: NEGATIVE mg/dL
Hgb urine dipstick: NEGATIVE
Ketones, ur: 15 mg/dL — AB
Protein, ur: NEGATIVE mg/dL
pH: 6 (ref 5.0–8.0)

## 2010-11-25 LAB — CBC
HCT: 35.6 % — ABNORMAL LOW (ref 36.0–46.0)
Hemoglobin: 11.3 g/dL — ABNORMAL LOW (ref 12.0–15.0)
MCV: 81.5 fL (ref 78.0–100.0)
RBC: 4.37 MIL/uL (ref 3.87–5.11)
WBC: 6.6 10*3/uL (ref 4.0–10.5)

## 2010-11-25 LAB — COMPREHENSIVE METABOLIC PANEL
Albumin: 3.7 g/dL (ref 3.5–5.2)
Alkaline Phosphatase: 70 U/L (ref 39–117)
BUN: 12 mg/dL (ref 6–23)
CO2: 25 mEq/L (ref 19–32)
Chloride: 103 mEq/L (ref 96–112)
Creatinine, Ser: 0.64 mg/dL (ref 0.50–1.10)
GFR calc non Af Amer: >90 mL/min (ref >90)
Glucose, Bld: 83 mg/dL (ref 70–99)
Potassium: 3.8 mEq/L (ref 3.5–5.1)
Total Bilirubin: 0.6 mg/dL (ref 0.3–1.2)

## 2010-11-25 MED ORDER — PROMETHAZINE HCL 25 MG/ML IJ SOLN
25.0000 mg | Freq: Once | INTRAMUSCULAR | Status: AC
  Administered 2010-11-25: 25 mg via INTRAMUSCULAR
  Filled 2010-11-25: qty 1

## 2010-11-25 NOTE — ED Provider Notes (Signed)
History   Pt presents today c/o worsening N&V and some diarrhea. She states she has had N&V for the past several weeks and has worsened the past several days. She states she can no longer keep anything on her stomach. She also reports "loose stools" but not watery diarrhea. She denies fever, vag dc, bleeding, or any other sx at this time. She has a documented IUP via prior US.  Chief Complaint  Patient presents with  . Morning Sickness   HPI  OB History    Grav Para Term Preterm Abortions TAB SAB Ect Mult Living   5 2 2  0 2 0 2 0 0 2      Past Medical History  Diagnosis Date  . Asthma   . Scoliosis     Past Surgical History  Procedure Date  . Cholecystectomy   . Tonsillectomy and adenoidectomy     No family history on file.  History  Substance Use Topics  . Smoking status: Former Smoker -- 0.2 packs/day  . Smokeless tobacco: Not on file  . Alcohol Use: No    Allergies: No Known Allergies  Prescriptions prior to admission  Medication Sig Dispense Refill  . ibuprofen (ADVIL,MOTRIN) 200 MG tablet Take 200 mg by mouth every 6 (six) hours as needed. For pain       . oxyCODONE-acetaminophen (PERCOCET) 10-325 MG per tablet Take 1 tablet by mouth every 4 (four) hours as needed. For pain       . promethazine (PHENERGAN) 25 MG tablet Take 1 tablet (25 mg total) by mouth every 6 (six) hours as needed for nausea.  20 tablet  0    Review of Systems  Constitutional: Positive for malaise/fatigue. Negative for fever.  Cardiovascular: Negative for chest pain.  Gastrointestinal: Positive for nausea, vomiting, abdominal pain and diarrhea. Negative for constipation and blood in stool.  Genitourinary: Negative for dysuria, urgency, frequency and hematuria.  Neurological: Negative for dizziness and headaches.  Psychiatric/Behavioral: Negative for depression and suicidal ideas.   Physical Exam   Blood pressure 109/78, pulse 71, temperature 98.7 F (37.1 C), temperature source Oral,  resp. rate 20, height 5\' 9"  (1.753 m), weight 249 lb (112.946 kg), last menstrual period 10/10/2010.  Physical Exam  Nursing note and vitals reviewed. Constitutional: She is oriented to person, place, and time. She appears well-developed and well-nourished. No distress.  HENT:  Head: Normocephalic and atraumatic.  Eyes: EOM are normal. Pupils are equal, round, and reactive to light.  Cardiovascular: Normal rate, regular rhythm and normal heart sounds.  Exam reveals no gallop and no friction rub.   No murmur heard. Respiratory: Effort normal and breath sounds normal. No respiratory distress. She has no wheezes. She has no rales. She exhibits no tenderness.  GI: Soft. She exhibits no distension. There is no tenderness. There is no rebound and no guarding.  Neurological: She is alert and oriented to person, place, and time.  Skin: Skin is warm and dry. She is not diaphoretic.  Psychiatric: She has a normal mood and affect. Her behavior is normal. Judgment and thought content normal.    MAU Course  Procedures  Results for orders placed during the hospital encounter of 11/25/10 (from the past 24 hour(s))  URINALYSIS, ROUTINE W REFLEX MICROSCOPIC     Status: Abnormal   Collection Time   11/25/10  5:20 PM      Component Value Range   Color, Urine YELLOW  YELLOW    Appearance CLEAR  CLEAR  Specific Gravity, Urine 1.025  1.005 - 1.030    pH 6.0  5.0 - 8.0    Glucose, UA NEGATIVE  NEGATIVE (mg/dL)   Hgb urine dipstick NEGATIVE  NEGATIVE    Bilirubin Urine NEGATIVE  NEGATIVE    Ketones, ur 15 (*) NEGATIVE (mg/dL)   Protein, ur NEGATIVE  NEGATIVE (mg/dL)   Urobilinogen, UA 0.2  0.0 - 1.0 (mg/dL)   Nitrite NEGATIVE  NEGATIVE    Leukocytes, UA TRACE (*) NEGATIVE   URINE MICROSCOPIC-ADD ON     Status: Abnormal   Collection Time   11/25/10  5:20 PM      Component Value Range   Squamous Epithelial / LPF FEW (*) RARE    WBC, UA 3-6  <3 (WBC/hpf)   Urine-Other MUCOUS PRESENT    CBC      Status: Abnormal   Collection Time   11/25/10  6:37 PM      Component Value Range   WBC 6.6  4.0 - 10.5 (K/uL)   RBC 4.37  3.87 - 5.11 (MIL/uL)   Hemoglobin 11.3 (*) 12.0 - 15.0 (g/dL)   HCT 69.6 (*) 29.5 - 46.0 (%)   MCV 81.5  78.0 - 100.0 (fL)   MCH 25.9 (*) 26.0 - 34.0 (pg)   MCHC 31.7  30.0 - 36.0 (g/dL)   RDW 28.4 (*) 13.2 - 15.5 (%)   Platelets 291  150 - 400 (K/uL)  COMPREHENSIVE METABOLIC PANEL     Status: Normal (Preliminary result)   Collection Time   11/25/10  6:37 PM      Component Value Range   Sodium PENDING  135 - 145 (mEq/L)   Potassium PENDING  3.5 - 5.1 (mEq/L)   Chloride PENDING  96 - 112 (mEq/L)   CO2 25  19 - 32 (mEq/L)   Glucose, Bld 83  70 - 99 (mg/dL)   BUN 12  6 - 23 (mg/dL)   Creatinine, Ser 4.40  0.50 - 1.10 (mg/dL)   Calcium 9.7  8.4 - 10.2 (mg/dL)   Total Protein 7.6  6.0 - 8.3 (g/dL)   Albumin 3.7  3.5 - 5.2 (g/dL)   AST 15  0 - 37 (U/L)   ALT 6  0 - 35 (U/L)   Alkaline Phosphatase 70  39 - 117 (U/L)   Total Bilirubin 0.6  0.3 - 1.2 (mg/dL)   GFR calc non Af Amer >90  >90 (mL/min)   GFR calc Af Amer >90  >90 (mL/min)   Pt sx improved following IM phenergan.  Assessment and Plan  N&V in preg: discussed with pt at length. She has an Rx for phenergan and will try to use these tabs vaginally. Discussed diet, activity, risks, and precautions.  Clinton Gallant. Rice III, DrHSc, MPAS, PA-C  11/25/2010, 6:25 PM   Henrietta Hoover, PA 11/25/10 1928

## 2010-11-25 NOTE — Progress Notes (Signed)
Since last seen has continued to throw up and having diarrhea ( twice a day)

## 2010-11-26 NOTE — ED Provider Notes (Signed)
Agree with above note.  Jacqueline Clarke 11/26/2010 12:46 AM

## 2010-11-29 ENCOUNTER — Encounter (HOSPITAL_COMMUNITY): Payer: Self-pay | Admitting: *Deleted

## 2010-11-29 ENCOUNTER — Inpatient Hospital Stay (HOSPITAL_COMMUNITY)
Admission: AD | Admit: 2010-11-29 | Discharge: 2010-11-29 | Disposition: A | Discharge status: Home / Self Care | Payer: Medicaid Other | Source: Ambulatory Visit | Attending: Obstetrics & Gynecology | Admitting: Obstetrics & Gynecology

## 2010-11-29 ENCOUNTER — Inpatient Hospital Stay (HOSPITAL_COMMUNITY): Payer: Medicaid Other

## 2010-11-29 DIAGNOSIS — O209 Hemorrhage in early pregnancy, unspecified: Secondary | ICD-10-CM | POA: Insufficient documentation

## 2010-11-29 DIAGNOSIS — R1110 Vomiting, unspecified: Secondary | ICD-10-CM

## 2010-11-29 DIAGNOSIS — R1115 Cyclical vomiting syndrome unrelated to migraine: Secondary | ICD-10-CM

## 2010-11-29 DIAGNOSIS — O210 Mild hyperemesis gravidarum: Secondary | ICD-10-CM | POA: Insufficient documentation

## 2010-11-29 LAB — URINE MICROSCOPIC-ADD ON

## 2010-11-29 LAB — URINALYSIS, ROUTINE W REFLEX MICROSCOPIC
Glucose, UA: NEGATIVE mg/dL
Specific Gravity, Urine: >1.03 — ABNORMAL HIGH (ref 1.005–1.030)
Urobilinogen, UA: 1 mg/dL (ref 0.0–1.0)

## 2010-11-29 MED ORDER — LACTATED RINGERS IV SOLN
INTRAVENOUS | Status: DC
  Administered 2010-11-29: 21:00:00 via INTRAVENOUS

## 2010-11-29 MED ORDER — ONDANSETRON HCL 4 MG/2ML IJ SOLN
4.0000 mg | Freq: Once | INTRAMUSCULAR | Status: AC
  Administered 2010-11-29: 4 mg via INTRAVENOUS
  Filled 2010-11-29: qty 2

## 2010-11-29 MED ORDER — CEPHALEXIN 500 MG PO CAPS: 500.0000 mg | ORAL_CAPSULE | Freq: Four times a day (QID) | ORAL | Status: AC

## 2010-11-29 MED ORDER — ONDANSETRON 4 MG PO TBDP: 4.0000 mg | ORAL_TABLET | Freq: Three times a day (TID) | ORAL | Status: AC | PRN

## 2010-11-29 MED ORDER — SODIUM CHLORIDE 0.9 % IV SOLN
25.0000 mg | Freq: Once | INTRAVENOUS | Status: AC
  Administered 2010-11-29: 25 mg via INTRAVENOUS
  Filled 2010-11-29: qty 1

## 2010-11-29 NOTE — Progress Notes (Signed)
Pt states, " I've had vomiting for 2 wks weeks and can't keep anything down. I put a phenergan pill in my vagina at 1 am. I work up at 2 pm today with vagina bleeding. It was on my panties and a lot when I wiped. I'm having cramping since yesterday morning, and it is getting worse,"

## 2010-11-29 NOTE — ED Provider Notes (Signed)
History   I accepted care of this pt from Wynelle Bourgeois, CNM. Please see her H&P.  Chief Complaint  Patient presents with  . Emesis During Pregnancy  . Vaginal Bleeding   HPI  OB History    Grav Para Term Preterm Abortions TAB SAB Ect Mult Living   5 2 2  0 2 0 2 0 0 2      Past Medical History  Diagnosis Date  . Asthma   . Scoliosis     Past Surgical History  Procedure Date  . Cholecystectomy   . Tonsillectomy and adenoidectomy     No family history on file.  History  Substance Use Topics  . Smoking status: Former Smoker -- 0.2 packs/day  . Smokeless tobacco: Never Used  . Alcohol Use: No    Allergies: No Known Allergies  Prescriptions prior to admission  Medication Sig Dispense Refill  . ibuprofen (ADVIL,MOTRIN) 200 MG tablet Take 200 mg by mouth every 6 (six) hours as needed. For pain       . oxyCODONE-acetaminophen (PERCOCET) 10-325 MG per tablet Take 1 tablet by mouth every 4 (four) hours as needed. For pain       . promethazine (PHENERGAN) 25 MG tablet Take 25 mg by mouth every 6 (six) hours as needed. Takes for nausea       . promethazine (PHENERGAN) 25 MG tablet Take 1 tablet (25 mg total) by mouth every 6 (six) hours as needed for nausea.  20 tablet  0    ROS Physical Exam   Blood pressure 121/82, pulse 77, temperature 99 F (37.2 C), temperature source Oral, resp. rate 20, height 5\' 8"  (1.727 m), weight 242 lb (109.77 kg), last menstrual period 10/10/2010.  Physical Exam  MAU Course  Procedures  US Ob Transvaginal  11/29/2010  *RADIOLOGY REPORT*  Clinical Data: *RADIOLOGY REPORT*  Clinical Data: Pregnant, intrauterine gestational sac with yolk sac last week, confirm viability  TRANSVAGINAL OBSTETRIC US  Technique:  Transvaginal ultrasound was performed for complete evaluation of the gestation as well as the maternal uterus, adnexal regions, and pelvic cul-de-sac.  Comparison:  11/21/2007  Intrauterine gestational sac: Present Yolk sac: Present  Embryo: Present Cardiac Activity: Present Heart Rate: 128 beats per minute  CRL: 6 mm, corresponding to an estimated gestational age of [redacted] weeks 3 days  [place] EDC: 07/22/2011 by the current crown rump length measurement  Subchorionic hemorrhage: Absent  Maternal uterus/adnexae: Left ovary measures 3.2 x 1.5 x 1.8 cm.  Right ovary measures 4.2 x 2.5 x 1.8 cm and is notable for a corpus luteal cyst.  No free fluid.  IMPRESSION: Single live intrauterine gestation.  Estimated gestational age of [redacted] weeks 5 days by prior first trimester ultrasound.  Original Report Authenticated By: Charline Bills, M.D.   Results for orders placed during the hospital encounter of 11/29/10 (from the past 24 hour(s))  URINALYSIS, ROUTINE W REFLEX MICROSCOPIC     Status: Abnormal   Collection Time   11/29/10  5:24 PM      Component Value Range   Color, Urine YELLOW  YELLOW    Appearance CLOUDY (*) CLEAR    Specific Gravity, Urine >1.030 (*) 1.005 - 1.030    pH 6.0  5.0 - 8.0    Glucose, UA NEGATIVE  NEGATIVE (mg/dL)   Hgb urine dipstick LARGE (*) NEGATIVE    Bilirubin Urine SMALL (*) NEGATIVE    Ketones, ur 15 (*) NEGATIVE (mg/dL)   Protein, ur 30 (*) NEGATIVE (mg/dL)  Urobilinogen, UA 1.0  0.0 - 1.0 (mg/dL)   Nitrite NEGATIVE  NEGATIVE    Leukocytes, UA MODERATE (*) NEGATIVE   URINE MICROSCOPIC-ADD ON     Status: Abnormal   Collection Time   11/29/10  5:24 PM      Component Value Range   Squamous Epithelial / LPF MANY (*) RARE    WBC, UA 21-50  <3 (WBC/hpf)   RBC / HPF 7-10  <3 (RBC/hpf)   Bacteria, UA FEW (*) RARE     Pt feels much improved following IV hydration and antiemetics. Assessment and Plan  Hyperemesis: discussed with pt at length. Advised pt to f/u with her OB provider ASAP. Will dc with Rx for zofran. Discussed diet, activity, risks, and precautions. Will also give Rx for keflex and await urine culture.  Clinton Gallant. Rice III, DrHSc, MPAS, PA-C  11/29/2010, 9:34 PM   Henrietta Hoover, PA 11/29/10  2136  Henrietta Hoover, PA 11/29/10 2142

## 2010-11-29 NOTE — ED Provider Notes (Signed)
History     Chief Complaint  Patient presents with  . Emesis During Pregnancy  . Vaginal Bleeding   HPI 25 y.o. at [redacted]w[redacted]d presents with c/o persistent vomiting and states had one episode of bright red bleeding today. No cramping. [place] last week showed IUGS with yolk sac.    OB History    Grav Para Term Preterm Abortions TAB SAB Ect Mult Living   5 2 2  0 2 0 2 0 0 2      Past Medical History  Diagnosis Date  . Asthma   . Scoliosis     Past Surgical History  Procedure Date  . Cholecystectomy   . Tonsillectomy and adenoidectomy     No family history on file.  History  Substance Use Topics  . Smoking status: Former Smoker -- 0.2 packs/day  . Smokeless tobacco: Never Used  . Alcohol Use: No    Allergies: No Known Allergies  Prescriptions prior to admission  Medication Sig Dispense Refill  . ibuprofen (ADVIL,MOTRIN) 200 MG tablet Take 200 mg by mouth every 6 (six) hours as needed. For pain       . oxyCODONE-acetaminophen (PERCOCET) 10-325 MG per tablet Take 1 tablet by mouth every 4 (four) hours as needed. For pain       . promethazine (PHENERGAN) 25 MG tablet Take 25 mg by mouth every 6 (six) hours as needed. Takes for nausea       . promethazine (PHENERGAN) 25 MG tablet Take 1 tablet (25 mg total) by mouth every 6 (six) hours as needed for nausea.  20 tablet  0    ROS As above  Physical Exam   Blood pressure 100/59, pulse 74, temperature 99 F (37.2 C), temperature source Oral, resp. rate 20, height 5\' 8"  (1.727 m), weight 242 lb (109.77 kg), last menstrual period 10/10/2010.  Physical Exam  Constitutional: She is oriented to person, place, and time. She appears well-developed and well-nourished. No distress.  HENT:  Head: Normocephalic.  Cardiovascular: Normal rate.   Respiratory: Effort normal.  GI: Soft.  Genitourinary: Vagina normal and uterus normal. No vaginal discharge found.       No blood in vault, cervix closed  Neurological: She is alert and  oriented to person, place, and time.  Skin: Skin is warm and dry.  Psychiatric: She has a normal mood and affect.    MAU Course  Procedures   Assessment and Plan  A:  IUP at 7.1 weeks     Vomiting of pregnancy      Reported vaginal bleedng  P:  IV hydration with Phenergan      Will recheck [place] for viability.      [facility] 11/29/2010, 7:32 PM

## 2010-12-01 LAB — URINE CULTURE: Colony Count: 85000

## 2010-12-03 ENCOUNTER — Inpatient Hospital Stay (HOSPITAL_COMMUNITY)
Admission: AD | Admit: 2010-12-03 | Discharge: 2010-12-03 | Disposition: A | Discharge status: Home / Self Care | Payer: Medicaid Other | Source: Ambulatory Visit | Attending: Obstetrics | Admitting: Obstetrics

## 2010-12-03 ENCOUNTER — Encounter (HOSPITAL_COMMUNITY): Payer: Self-pay

## 2010-12-03 DIAGNOSIS — O210 Mild hyperemesis gravidarum: Secondary | ICD-10-CM | POA: Insufficient documentation

## 2010-12-03 DIAGNOSIS — O211 Hyperemesis gravidarum with metabolic disturbance: Secondary | ICD-10-CM

## 2010-12-03 HISTORY — DX: Anemia, unspecified: D64.9

## 2010-12-03 MED ORDER — ONDANSETRON 4 MG PO TBDP: 4.0000 mg | ORAL_TABLET | Freq: Three times a day (TID) | ORAL | Status: AC | PRN

## 2010-12-03 MED ORDER — ONDANSETRON HCL 40 MG/20ML IJ SOLN
Freq: Once | INTRAVENOUS | Status: AC
  Administered 2010-12-03: 15:00:00 via INTRAVENOUS
  Filled 2010-12-03: qty 4

## 2010-12-03 MED ORDER — LACTATED RINGERS IV SOLN
10.0000 mL | Freq: Once | INTRAVENOUS | Status: AC
  Administered 2010-12-03: 10 mL via INTRAVENOUS
  Filled 2010-12-03: qty 10

## 2010-12-03 NOTE — Progress Notes (Signed)
Pt states was seen at [facility] for routine MD appt. Sent to MAU for iv fluids. Last emesis 30 minutes ago, has had hyperemesis this pregnancy.

## 2010-12-03 NOTE — ED Provider Notes (Signed)
Jacqueline L [place]25 [id number] @[redacted]w[redacted]d  Chief Complaint  Patient presents with  . Hyperemesis Gravidarum    SUBJECTIVE  HPI: This is her second visit here for hyperemesis which has been unresponsive to Zofran and Phenergan. She's vomited multiple times since yesterday and retained no solids or fluids for approximately 24 hours. She went to the coming office today for a new OB appointment however they found her to be dehydrated on urinalysis and send her here with orders for IV rehydration. On 11 9 she was seen here for vaginal bleeding and had an ultrasound showing a viable IUP 6 weeks 6 days and a right corpus luteal cyst 4.2 cm. She is reporting some right suprapubic pain that's been present for about one day and is moderate and constant.  Past Medical History  Diagnosis Date  . Asthma   . Scoliosis   . Anemia    Past Surgical History  Procedure Date  . Cholecystectomy   . Adenoidectomy    History   Social History  . Marital Status: Single    Spouse Name: N/A    Number of Children: N/A  . Years of Education: N/A   Occupational History  . Not on file.   Social History Main Topics  . Smoking status: Former Smoker -- 0.2 packs/day  . Smokeless tobacco: Never Used  . Alcohol Use: No  . Drug Use: No  . Sexually Active: Yes    Birth Control/ Protection: None   Other Topics Concern  . Not on file   Social History Narrative  . No narrative on file   No current facility-administered medications on file prior to encounter.   Current Outpatient Prescriptions on File Prior to Encounter  Medication Sig Dispense Refill  . cephALEXin (KEFLEX) 500 MG capsule Take 1 capsule (500 mg total) by mouth 4 (four) times daily.  28 capsule  0  . ondansetron (ZOFRAN ODT) 4 MG disintegrating tablet Take 1 tablet (4 mg total) by mouth every 8 (eight) hours as needed for nausea.  20 tablet  0  . promethazine (PHENERGAN) 25 MG tablet Take 25 mg by mouth every 6 (six) hours as needed. Takes  for nausea        No Known Allergies  ROS: Pertinent items in HPI  OBJECTIVE  BP 115/49  Pulse 63  Temp(Src) 98.4 F (36.9 C) (Oral)  Resp 16  Ht 5\' 8"  (1.727 m)  Wt 109.77 kg (242 lb)  BMI 36.80 kg/m2  LMP 10/10/2010  Physical Exam  Constitutional: She is oriented to person, place, and time. She appears distressed.  HENT:  Head: Normocephalic.  Neck: Neck supple.  Cardiovascular: Normal rate.   Pulmonary/Chest: Effort normal.  Abdominal: Soft. She exhibits no distension and no mass. There is tenderness. There is no rebound and no guarding.       Tenderness is mild  Musculoskeletal: Normal range of motion.  Neurological: She is alert and oriented to person, place, and time.  Skin: Skin is warm and dry.  Psychiatric: Affect normal.    MAU Course:  After rehydration with IVF with MVI and antiemetics, she feels much better and is retaining fluids and crackers.  ASSESSMENT   Hyperemesis greavidarum  PLAN  Discharge home with Rx Zofran

## 2010-12-03 NOTE — ED Notes (Signed)
No adverse effect from zofran, states feels better, nausea relieved.

## 2010-12-19 ENCOUNTER — Encounter (HOSPITAL_COMMUNITY): Payer: Self-pay | Admitting: *Deleted

## 2010-12-19 ENCOUNTER — Inpatient Hospital Stay (HOSPITAL_COMMUNITY)
Admission: AD | Admit: 2010-12-19 | Discharge: 2010-12-21 | DRG: 781 | Disposition: A | Discharge status: Home / Self Care | Payer: Medicaid Other | Source: Ambulatory Visit | Attending: Obstetrics | Admitting: Obstetrics

## 2010-12-19 DIAGNOSIS — O210 Mild hyperemesis gravidarum: Secondary | ICD-10-CM | POA: Diagnosis present

## 2010-12-19 DIAGNOSIS — E860 Dehydration: Secondary | ICD-10-CM | POA: Diagnosis present

## 2010-12-19 DIAGNOSIS — O211 Hyperemesis gravidarum with metabolic disturbance: Principal | ICD-10-CM | POA: Diagnosis present

## 2010-12-19 HISTORY — DX: Unspecified blood type, rh negative: Z67.91

## 2010-12-19 LAB — COMPREHENSIVE METABOLIC PANEL
ALT: 56 U/L — ABNORMAL HIGH (ref 0–35)
AST: 36 U/L (ref 0–37)
Albumin: 3.4 g/dL — ABNORMAL LOW (ref 3.5–5.2)
Alkaline Phosphatase: 91 U/L (ref 39–117)
CO2: 20 mEq/L (ref 19–32)
Chloride: 103 mEq/L (ref 96–112)
Creatinine, Ser: 0.67 mg/dL (ref 0.50–1.10)
GFR calc non Af Amer: >90 mL/min (ref >90)
Potassium: 4 mEq/L (ref 3.5–5.1)
Sodium: 137 mEq/L (ref 135–145)
Total Bilirubin: 0.4 mg/dL (ref 0.3–1.2)

## 2010-12-19 LAB — URINALYSIS, ROUTINE W REFLEX MICROSCOPIC
Hgb urine dipstick: NEGATIVE
Protein, ur: 30 mg/dL — AB
Specific Gravity, Urine: >1.03 — ABNORMAL HIGH (ref 1.005–1.030)
Urobilinogen, UA: 1 mg/dL (ref 0.0–1.0)

## 2010-12-19 LAB — CBC
MCV: 80.9 fL (ref 78.0–100.0)
Platelets: 268 10*3/uL (ref 150–400)
RBC: 4.67 MIL/uL (ref 3.87–5.11)
RDW: 14.9 % (ref 11.5–15.5)
WBC: 7.8 10*3/uL (ref 4.0–10.5)

## 2010-12-19 LAB — URINE MICROSCOPIC-ADD ON

## 2010-12-19 MED ORDER — METHYLPREDNISOLONE 4 MG PO TABS: 4.0000 mg | ORAL_TABLET | Freq: Every day | ORAL | Status: DC

## 2010-12-19 MED ORDER — METHYLPREDNISOLONE 16 MG PO TABS
48.0000 mg | ORAL_TABLET | Freq: Once | ORAL | Status: AC
  Administered 2010-12-19: 48 mg via ORAL
  Filled 2010-12-19: qty 3

## 2010-12-19 MED ORDER — METHYLPREDNISOLONE 16 MG PO TABS
16.0000 mg | ORAL_TABLET | Freq: Every day | ORAL | Status: DC
  Administered 2010-12-20 – 2010-12-21 (×2): 16 mg via ORAL
  Filled 2010-12-19 (×3): qty 1

## 2010-12-19 MED ORDER — METHYLPREDNISOLONE 16 MG PO TABS
16.0000 mg | ORAL_TABLET | Freq: Every day | ORAL | Status: AC
  Administered 2010-12-20 – 2010-12-21 (×2): 16 mg via ORAL
  Filled 2010-12-19 (×2): qty 1

## 2010-12-19 MED ORDER — DEXTROSE 5 % IN LACTATED RINGERS IV BOLUS
1000.0000 mL | Freq: Once | INTRAVENOUS | Status: AC
  Administered 2010-12-19: 1000 mL via INTRAVENOUS

## 2010-12-19 MED ORDER — METHYLPREDNISOLONE 4 MG PO TABS: 8.0000 mg | ORAL_TABLET | Freq: Every day | ORAL | Status: DC

## 2010-12-19 MED ORDER — METHYLPREDNISOLONE 16 MG PO TABS
16.0000 mg | ORAL_TABLET | Freq: Every day | ORAL | Status: DC
  Administered 2010-12-20: 16 mg via ORAL
  Filled 2010-12-19 (×2): qty 1

## 2010-12-19 MED ORDER — ONDANSETRON HCL 4 MG/2ML IJ SOLN
4.0000 mg | Freq: Once | INTRAMUSCULAR | Status: AC
  Administered 2010-12-19: 4 mg via INTRAVENOUS
  Filled 2010-12-19: qty 2

## 2010-12-19 MED ORDER — FAMOTIDINE IN NACL 20-0.9 MG/50ML-% IV SOLN
20.0000 mg | Freq: Once | INTRAVENOUS | Status: AC
  Administered 2010-12-19: 20 mg via INTRAVENOUS
  Filled 2010-12-19: qty 50

## 2010-12-19 MED ORDER — LACTATED RINGERS IV SOLN
INTRAVENOUS | Status: DC
  Administered 2010-12-19 – 2010-12-21 (×4): via INTRAVENOUS
  Filled 2010-12-19 (×8): qty 1000

## 2010-12-19 MED ORDER — M.V.I. ADULT IV INJ
INJECTION | Freq: Once | INTRAVENOUS | Status: AC
  Administered 2010-12-19: 18:00:00 via INTRAVENOUS
  Filled 2010-12-19: qty 1000

## 2010-12-19 MED ORDER — METHYLPREDNISOLONE 4 MG PO TABS
8.0000 mg | ORAL_TABLET | Freq: Every day | ORAL | Status: DC
  Filled 2010-12-19: qty 2

## 2010-12-19 MED ORDER — ZOLPIDEM TARTRATE 10 MG PO TABS
10.0000 mg | ORAL_TABLET | Freq: Every day | ORAL | Status: DC
  Administered 2010-12-19 – 2010-12-20 (×2): 10 mg via ORAL
  Filled 2010-12-19 (×2): qty 1

## 2010-12-19 MED ORDER — PROMETHAZINE HCL 25 MG/ML IJ SOLN
25.0000 mg | Freq: Once | INTRAMUSCULAR | Status: AC
  Administered 2010-12-19: 25 mg via INTRAVENOUS
  Filled 2010-12-19: qty 1

## 2010-12-19 NOTE — ED Notes (Signed)
2 unsuccessful IV attempts in L antecubital.  Another RN to try.

## 2010-12-19 NOTE — Progress Notes (Signed)
Pharmacy Consult:   MEDROL (METHYLPREDNISOLONE) TAPER  FOR HYPEREMESIS GRAVIDARUM PATIENTS  The following is a 14 day taper of methylprednisolone for hyperemesis.All doses entered as PO. RN states patient is tolerating po's at this time--she will alert pharmacy if IV doses are needed.. (If patient cannot tolerate oral medications, contact the pharmacy to change route to IV.)   Date Day Morning Midday Bedtime  12-19-10 1   48mg   12-20-10 2 16  mg 16 mg 16 mg  12-21-10 3 16  mg 16 mg 16 mg  12-22-10 4 16  mg 8 mg 16 mg  12-23-10 5 16  mg 8 mg 8 mg  12-24-10 6 8  mg 8 mg 8 mg  12-25-10 7 8  mg 4 mg 8 mg  12-26-10 8 8  mg 4 mg 4 mg  12-27-10 9 8  mg 4 mg   12-28-10 10 8  mg 4 mg   12-29-10 11 8  mg    12-30-10 12 8  mg    12-31-10 13 4  mg    01-01-11 14 4  mg     Check fasting blood sugars daily while on the taper. Notify MD if fasting blood sugar>95.  Claybon Jabs 12/19/2010

## 2010-12-19 NOTE — Progress Notes (Signed)
Patient states she has had nausea and vomiting for the entire pregnancy. Became worse on 11-20 and has not been able to keep anything down. Is now having abdominal pain. Sent from the office for hydration.

## 2010-12-19 NOTE — ED Provider Notes (Signed)
History     Chief Complaint  Patient presents with  . Emesis During Pregnancy   HPI Ongoing nausea and vomiting throughout pregnancy, worse since 11/20, not able to keep anything down for the last 24 hours. Last attempt at eating/drinking was yesterday morning at 9 AM - had yogurt and water. Taking Zofran and Phenergan irregularly at home, took Phenergan PO this morning, vomited immediately afterwards, took Zofran ODT at 4PM with no relief.   OB History    Grav Para Term Preterm Abortions TAB SAB Ect Mult Living   5 2 2  0 2 0 2 0 0 2      Past Medical History  Diagnosis Date  . Asthma   . Scoliosis   . Anemia   . Blood type, Rh negative     Past Surgical History  Procedure Date  . Cholecystectomy   . Adenoidectomy     No family history on file.  History  Substance Use Topics  . Smoking status: Former Smoker -- 0.2 packs/day  . Smokeless tobacco: Never Used  . Alcohol Use: No    Allergies: No Known Allergies  Prescriptions prior to admission  Medication Sig Dispense Refill  . ondansetron (ZOFRAN-ODT) 8 MG disintegrating tablet Take 8 mg by mouth every 8 (eight) hours as needed. nausea       . promethazine (PHENERGAN) 25 MG tablet Take 25 mg by mouth every 6 (six) hours as needed. Takes for nausea         Review of Systems  Constitutional: Negative.   Respiratory: Negative.   Cardiovascular: Negative.   Gastrointestinal: Positive for nausea, vomiting and abdominal pain. Negative for diarrhea and constipation.  Genitourinary: Negative for dysuria, urgency, frequency, hematuria and flank pain.       Negative for vaginal bleeding, vaginal discharge, dyspareunia  Musculoskeletal: Negative.   Neurological: Negative.   Psychiatric/Behavioral: Negative.    Physical Exam   Blood pressure 108/71, pulse 100, temperature 99.2 F (37.3 C), temperature source Oral, height 5\' 8"  (1.727 m), weight 107.684 kg (237 lb 6.4 oz), last menstrual period 10/10/2010, SpO2  99.00%.  Physical Exam  Nursing note and vitals reviewed. Constitutional: She is oriented to person, place, and time. She appears well-developed and well-nourished. No distress.  Cardiovascular: Normal rate.   Respiratory: Effort normal. No respiratory distress.  GI: Soft. She exhibits no distension. There is no tenderness.  Musculoskeletal: Normal range of motion.  Neurological: She is alert and oriented to person, place, and time.  Skin: Skin is warm and dry.  Psychiatric: She has a normal mood and affect.    MAU Course  Procedures Consulted with Dr. Tamela Oddi - recommends IV hydration and antiemetics, will call back if no improvement.    No improvement after 2 L D5LR, one liter with multivitamin, 8 mg Zofran, 25 mg Phenergan and 20 mg Pepcid IV.   Assessment and Plan  25 y.o. [id number] at [redacted]w[redacted]d Hyperemesis Per Dr. Gaynell Face, pt to be admitted for inpatient management, orders received by RN  [facility] 12/19/2010, 3:44 PM

## 2010-12-20 DIAGNOSIS — O210 Mild hyperemesis gravidarum: Secondary | ICD-10-CM | POA: Diagnosis present

## 2010-12-20 LAB — GLUCOSE, CAPILLARY: Glucose-Capillary: 157 mg/dL — ABNORMAL HIGH (ref 70–99)

## 2010-12-20 LAB — URINE CULTURE
Colony Count: 60000
Culture  Setup Time: 201211300047
Special Requests: NORMAL

## 2010-12-20 NOTE — Progress Notes (Signed)
Pt instructed to eat light food and nothing greasy today. Pt stated that she ate a cheeseburger and is now vomiting.

## 2010-12-20 NOTE — H&P (Signed)
This is Dr. Francoise Ceo dictating the history and physical on  Jacqueline Clarke she's a 25 year old little gravida 5 para tool told to 10 weeks and one day her [facility] 07/17/2011 this is her fifth hospital visit for hyperemesis and she is now on IV Ringer's lactate and a him to Zofran and has been started on the IVC dry taper Past medical history negative Past surgical history negative Social history negative System review noncontributory Physical exam Obese female in no distress at this time Lungs clear Heart regular rhythm no murmurs no gallops Breasts negative Abdomen negative Pelvic deferred Extremities negative

## 2010-12-20 NOTE — Progress Notes (Signed)
UR Chart review completed.  

## 2010-12-20 NOTE — Progress Notes (Signed)
Patient ID: Jacqueline Clarke, female   DOB: 01/19/1986, 25 y.o.   MRN: [id number] S: Admitted with hyperemesis gravidarum at 10w 3d feeling better this morning, no nausea or vomiting. Pt would like to try solid food.   O: Blood pressure 123/83, pulse 92, temperature 99.5 F (37.5 C), temperature source Oral, resp. rate 18, height 5\' 9"  (1.753 m), weight 109.861 kg (242 lb 3.2 oz), last menstrual period 10/10/2010, SpO2 99.00%. General: Alert and oriented, in no acute distress.    [id number] SVE:  deferred, no vaginal bleeding.   A/P- 25 y.o. admitted with hyperemesis gravidarum with dehydration.  Continue current IV fluids with Zofran in bag, PO steroid taper. Trial of regular diet.  Consult with Dr. Tamela Oddi for plan of care.  Anice Paganini CNM 12/20/10 10:12 AM

## 2010-12-21 LAB — GLUCOSE, CAPILLARY

## 2010-12-21 MED ORDER — CLOTRIMAZOLE 1 % VA CREA: 1.0000 | TOPICAL_CREAM | Freq: Every day | VAGINAL | Status: DC

## 2010-12-21 MED ORDER — POLYETHYLENE GLYCOL 3350 17 G PO PACK: 17.0000 g | PACK | Freq: Once | ORAL | Status: AC

## 2010-12-21 MED ORDER — METHYLPREDNISOLONE 16 MG PO TABS: 16.0000 mg | ORAL_TABLET | Freq: Every day | ORAL | Status: DC

## 2010-12-21 MED ORDER — FAMOTIDINE 20 MG PO TABS
20.0000 mg | ORAL_TABLET | Freq: Every day | ORAL | Status: DC
  Administered 2010-12-21: 20 mg via ORAL
  Filled 2010-12-21: qty 1

## 2010-12-21 MED ORDER — ONDANSETRON 8 MG PO TBDP: 8.0000 mg | ORAL_TABLET | Freq: Three times a day (TID) | ORAL | Status: AC | PRN

## 2010-12-21 MED ORDER — METHYLPREDNISOLONE 4 MG PO TABS: 4.0000 mg | ORAL_TABLET | Freq: Every day | ORAL | Status: AC

## 2010-12-21 MED ORDER — POLYETHYLENE GLYCOL 3350 17 G PO PACK
17.0000 g | PACK | Freq: Once | ORAL | Status: AC
  Administered 2010-12-21: 17 g via ORAL
  Filled 2010-12-21: qty 1

## 2010-12-21 MED ORDER — METHYLPREDNISOLONE 8 MG PO TABS: 8.0000 mg | ORAL_TABLET | Freq: Every day | ORAL | Status: AC

## 2010-12-21 MED ORDER — CLOTRIMAZOLE 1 % VA CREA
1.0000 | TOPICAL_CREAM | Freq: Every day | VAGINAL | Status: DC
  Administered 2010-12-21: 1 via VAGINAL
  Filled 2010-12-21: qty 45

## 2010-12-21 NOTE — Progress Notes (Signed)
S: Less nausea  O: Blood pressure 102/69, pulse 91, temperature 98.7 F (37.1 C), temperature source Oral, resp. rate 18, height 5\' 9"  (1.753 m), weight 112.175 kg (247 lb 4.8 oz), last menstrual period 10/10/2010, SpO2 98.00%.    A/P- 25 y.o. admitted with hyperemesis gravidarum Preterm labor management: modified bedrest advised and OK for d/c home with steroid taper/antiemetics Dating:  [redacted]w[redacted]d

## 2010-12-21 NOTE — Discharge Summary (Signed)
  Physician Discharge Summary  Patient ID: Jacqueline Clarke MRN: [id number] DOB/AGE: Dec 06, 1985 25 y.o.  Admit date: 12/19/2010 Discharge date: 12/21/2010  Admission Diagnoses:  Hyperemesis gravidarum  Discharge Diagnoses:  Active Problems:  Hyperemesis complicating pregnancy, antepartum   Discharged Condition: stable  Hospital Course: The patient was given IV hydration.  A steroid taper was started.  Her nausea improved and she was tolerating a regular diet at discharge.  Consults: none  Significant Diagnostic Studies: labs: CMET/CBC  Treatments: steroids: methylprednisolone  Discharge Exam: Blood pressure 102/69, pulse 91, temperature 98.7 F (37.1 C), temperature source Oral, resp. rate 18, height 5\' 9"  (1.753 m), weight 112.175 kg (247 lb 4.8 oz), last menstrual period 10/10/2010, SpO2 98.00%. General appearance: alert GI: soft, non-tender; bowel sounds normal; no masses,  no organomegaly Extremities: extremities normal, atraumatic, no cyanosis or edema  Disposition: Home or Self Care  Discharge Orders    Future Orders Please Complete By Expires   Discharge diet:      Scheduling Instructions:   Parke Simmers     Current Discharge Medication List    START taking these medications   Details  clotrimazole (GYNE-LOTRIMIN) 1 % vaginal cream Place 1 Applicatorful vaginally at bedtime. Qty: 45 g, Refills: 0    !! methylPREDNISolone (MEDROL) 16 MG tablet Take 1 tablet (16 mg total) by mouth daily with breakfast. Qty: 7 tablet, Refills: 0    !! methylPREDNISolone (MEDROL) 16 MG tablet Take 1 tablet (16 mg total) by mouth daily at 2 PM daily at 2 PM. Qty: 7 tablet, Refills: 0    !! methylPREDNISolone (MEDROL) 16 MG tablet Take 1 tablet (16 mg total) by mouth at bedtime. Qty: 7 tablet, Refills: 0    !! methylPREDNISolone (MEDROL) 4 MG tablet Take 1 tablet (4 mg total) by mouth daily with breakfast. Qty: 7 tablet, Refills: 0    !! methylPREDNISolone (MEDROL) 4 MG  tablet Take 1 tablet (4 mg total) by mouth daily at 2 PM daily at 2 PM. Qty: 7 tablet, Refills: 0    !! methylPREDNISolone (MEDROL) 4 MG tablet Take 1 tablet (4 mg total) by mouth at bedtime. Qty: 7 tablet, Refills: 0    !! methylPREDNISolone (MEDROL) 8 MG tablet Take 1 tablet (8 mg total) by mouth daily with breakfast. Qty: 7 tablet, Refills: 0    !! methylPREDNISolone (MEDROL) 8 MG tablet Take 1 tablet (8 mg total) by mouth daily at 2 PM daily at 2 PM. Qty: 1 tablet, Refills: 0    !! methylPREDNISolone (MEDROL) 8 MG tablet Take 1 tablet (8 mg total) by mouth at bedtime. Qty: 7 tablet, Refills: 0    polyethylene glycol (MIRALAX / GLYCOLAX) packet Take 17 g by mouth once. Qty: 14 each, Refills: 1     !! - Potential duplicate medications found. Please discuss with provider.    CONTINUE these medications which have CHANGED   Details  ondansetron (ZOFRAN ODT) 8 MG disintegrating tablet Take 1 tablet (8 mg total) by mouth every 8 (eight) hours as needed for nausea. Qty: 20 tablet, Refills: 0      STOP taking these medications     promethazine (PHENERGAN) 25 MG tablet        Follow-up Information    Please follow up. (keep previous appointment)          Signed: Antionette Char A 12/21/2010, 11:20 AM

## 2010-12-21 NOTE — Progress Notes (Signed)
Pt was placed on contact precautions due to Hx of MRSA; MRSA screen swab this admission was negative. Per report from previous shift nurse, contact precautions had been D/C'd by Infection Control; order not D/C'd in EPIC.

## 2010-12-21 NOTE — Progress Notes (Signed)
12/21/10 1300 d/c instructions & prescriptions given - awaiting ride home

## 2010-12-21 NOTE — Progress Notes (Signed)
Pt awakened by RN for vital signs, etc. Pt states she is still feeling heartburn despite the Pepcid given. Also states that she vomited x 1 about 30 minutes ago. No present c/o of nausea.

## 2010-12-26 ENCOUNTER — Encounter (HOSPITAL_COMMUNITY): Payer: Self-pay | Admitting: *Deleted

## 2010-12-26 ENCOUNTER — Inpatient Hospital Stay (HOSPITAL_COMMUNITY)
Admission: AD | Admit: 2010-12-26 | Discharge: 2010-12-28 | DRG: 781 | Disposition: A | Discharge status: Home / Self Care | Payer: Medicaid Other | Source: Ambulatory Visit | Attending: Obstetrics & Gynecology | Admitting: Obstetrics & Gynecology

## 2010-12-26 DIAGNOSIS — E860 Dehydration: Secondary | ICD-10-CM | POA: Diagnosis present

## 2010-12-26 DIAGNOSIS — O210 Mild hyperemesis gravidarum: Secondary | ICD-10-CM

## 2010-12-26 DIAGNOSIS — O211 Hyperemesis gravidarum with metabolic disturbance: Principal | ICD-10-CM | POA: Diagnosis present

## 2010-12-26 LAB — CBC
Hemoglobin: 11.3 g/dL — ABNORMAL LOW (ref 12.0–15.0)
MCH: 26.2 pg (ref 26.0–34.0)
MCV: 80.3 fL (ref 78.0–100.0)
Platelets: 262 10*3/uL (ref 150–400)
RBC: 4.32 MIL/uL (ref 3.87–5.11)
WBC: 7.7 10*3/uL (ref 4.0–10.5)

## 2010-12-26 LAB — COMPREHENSIVE METABOLIC PANEL
ALT: 77 U/L — ABNORMAL HIGH (ref 0–35)
AST: 79 U/L — ABNORMAL HIGH (ref 0–37)
Alkaline Phosphatase: 106 U/L (ref 39–117)
CO2: 24 mEq/L (ref 19–32)
Chloride: 102 mEq/L (ref 96–112)
GFR calc Af Amer: >90 mL/min (ref >90)
GFR calc non Af Amer: >90 mL/min (ref >90)
Glucose, Bld: 79 mg/dL (ref 70–99)
Potassium: 3.6 mEq/L (ref 3.5–5.1)
Sodium: 136 mEq/L (ref 135–145)

## 2010-12-26 LAB — DIFFERENTIAL
Eosinophils Absolute: 0.4 10*3/uL (ref 0.0–0.7)
Lymphocytes Relative: 23 % (ref 12–46)
Lymphs Abs: 1.8 10*3/uL (ref 0.7–4.0)
Monocytes Relative: 8 % (ref 3–12)
Neutrophils Relative %: 64 % (ref 43–77)

## 2010-12-26 LAB — RAPID HIV SCREEN (WH-MAU): Rapid HIV Screen: NONREACTIVE

## 2010-12-26 LAB — HEPATITIS B SURFACE ANTIGEN: Hepatitis B Surface Ag: NEGATIVE

## 2010-12-26 LAB — TYPE AND SCREEN
ABO/RH(D): O NEG
Antibody Screen: NEGATIVE

## 2010-12-26 MED ORDER — DEXTROSE 5 % IV SOLN
1.0000 g | Freq: Two times a day (BID) | INTRAVENOUS | Status: DC
  Administered 2010-12-26 – 2010-12-28 (×4): 1 g via INTRAVENOUS
  Filled 2010-12-26 (×5): qty 10

## 2010-12-26 MED ORDER — DEXTROSE IN LACTATED RINGERS 5 % IV SOLN: 10.0000 mL | Freq: Once | INTRAVENOUS | Status: DC

## 2010-12-26 MED ORDER — DIPHENHYDRAMINE-ZINC ACETATE 2-0.1 % EX CREA
TOPICAL_CREAM | Freq: Three times a day (TID) | CUTANEOUS | Status: DC | PRN
  Administered 2010-12-28: 12:00:00 via TOPICAL
  Filled 2010-12-26: qty 28.4

## 2010-12-26 MED ORDER — PYRIDOXINE HCL 100 MG/ML IJ SOLN
INTRAVENOUS | Status: DC
  Administered 2010-12-27 (×2): via INTRAVENOUS
  Filled 2010-12-26 (×2): qty 1000

## 2010-12-26 MED ORDER — ACETAMINOPHEN 325 MG PO TABS: 650.0000 mg | ORAL_TABLET | Freq: Four times a day (QID) | ORAL | Status: DC | PRN

## 2010-12-26 MED ORDER — DEXTROSE IN LACTATED RINGERS 5 % IV SOLN: INTRAVENOUS | Status: DC

## 2010-12-26 MED ORDER — SODIUM CHLORIDE 0.9 % IV SOLN
25.0000 mg | Freq: Four times a day (QID) | INTRAVENOUS | Status: DC
  Administered 2010-12-26 – 2010-12-27 (×3): 25 mg via INTRAVENOUS
  Filled 2010-12-26 (×5): qty 1

## 2010-12-26 MED ORDER — SODIUM CHLORIDE 0.9 % IV SOLN
INTRAVENOUS | Status: DC
  Administered 2010-12-26 – 2010-12-28 (×2): via INTRAVENOUS

## 2010-12-26 MED ORDER — ONDANSETRON HCL 4 MG/2ML IJ SOLN: 8.0000 mg | Freq: Three times a day (TID) | INTRAMUSCULAR | Status: DC | PRN

## 2010-12-26 NOTE — H&P (Signed)
Jacqueline Clarke is a 25 y.o. female G5 P2022 presenting for hyperemesis at [redacted] weeks gestation and for UTI. Maternal Medical History:  Reason for admission: Reason for admission: nausea.  Contractions: none  Fetal activity: None, [redacted] weeks gestation.  Prenatal complications: Hyperemesis gravidarum  Prenatal Complications - Diabetes: none.    OB History    Grav Para Term Preterm Abortions TAB SAB Ect Mult Living   5 2 2  0 2 0 2 0 0 2     Past Medical History  Diagnosis Date  . Asthma   . Scoliosis   . Anemia   . Blood type, Rh negative    Past Surgical History  Procedure Date  . Cholecystectomy   . Adenoidectomy    Family History: family history is not on file. Social History:  reports that she has quit smoking. She has never used smokeless tobacco. She reports that she does not drink alcohol or use illicit drugs.  Review of Systems  Constitutional: Positive for weight loss and malaise/fatigue. Negative for fever.  HENT: Negative for neck pain.   Eyes: Negative for blurred vision.  Respiratory: Negative for cough and shortness of breath.   Cardiovascular: Negative for chest pain and palpitations.  Gastrointestinal: Positive for nausea and vomiting (vomiting 10 or more times daily. ).  Genitourinary: Negative for dysuria, urgency, frequency, hematuria and flank pain.  Musculoskeletal: Negative for back pain.  Skin: Negative for itching and rash.  Neurological: Negative for dizziness and headaches.  Psychiatric/Behavioral: Negative for depression.      Blood pressure 109/73, pulse 102, temperature 99.1 F (37.3 C), temperature source Oral, resp. rate 18, height 5\' 10"  (1.778 m), weight 109.861 kg (242 lb 3.2 oz), last menstrual period 10/10/2010, SpO2 95.00%. Maternal Exam:  Introitus: not evaluated.   Cervix: not evaluated.   Fetal Exam Fetal Monitor Review: Mode: hand-held doppler probe.   Baseline rate: 160, fetal heart tones dopplered in office today.Marland Kitchen        Physical Exam  Constitutional: She is oriented to person, place, and time. She appears well-developed and well-nourished. No distress.  HENT:  Head: Normocephalic and atraumatic.  Eyes: Pupils are equal, round, and reactive to light.  Neck: Normal range of motion.  Cardiovascular: Normal rate and regular rhythm.   Respiratory: Effort normal and breath sounds normal.  GI: Soft. Bowel sounds are normal. There is no tenderness.  Musculoskeletal: Normal range of motion.  Neurological: She is alert and oriented to person, place, and time.  Skin: Skin is warm and dry.  Psychiatric: She has a normal mood and affect. Her behavior is normal. Judgment and thought content normal.    Prenatal labs: ABO, Rh: --/--/O NEG (12/06 1700) Antibody: NEG (12/06 1700) Rubella:   RPR: NON REACTIVE (12/11 2125)  HBsAg:    HIV:    GBS:     Assessment/Plan: Persistent dehydration and vomiting, and +nitrites on urine dip in office today. Admit to [facility] Unit for rehydration, MVI with fluids, IV rocephin, Chlorpromazine IV for nausea. Diet advanced as tolerated.     Anice Paganini CNM 12/26/2010, 8:38 PM

## 2010-12-27 LAB — VITAMIN D 25 HYDROXY (VIT D DEFICIENCY, FRACTURES): Vit D, 25-Hydroxy: 20 ng/mL — ABNORMAL LOW (ref 30–89)

## 2010-12-27 MED ORDER — SODIUM CHLORIDE 0.9 % IV SOLN
25.0000 mg | Freq: Four times a day (QID) | INTRAVENOUS | Status: DC | PRN
  Filled 2010-12-27: qty 1

## 2010-12-27 NOTE — Progress Notes (Signed)
Patient ID: Jacqueline Clarke, female   DOB: September 15, 1985, 25 y.o.   MRN: [id number] S: Less nauseated.  O: Blood pressure 102/69, pulse 87, temperature 98.7 F (37.1 C), temperature source Oral, resp. rate 18, height 5\' 10"  (1.778 m), weight 111.925 kg (246 lb 12 oz), last menstrual period 10/10/2010, SpO2 99.00%.   A/P- 25 y.o. admitted with hyperemesis gravidarum Nausea improving Dating:  [redacted]w[redacted]d  Advance diet

## 2010-12-27 NOTE — Progress Notes (Signed)
UR Chart review completed.  

## 2010-12-28 NOTE — Progress Notes (Signed)
D/c instructions reviewed with pt., pt. States understanding of same.  No home equipment needed.  Ambulated to car with staff without incident.  D/c'd home with s.o.

## 2010-12-28 NOTE — Discharge Summary (Signed)
  Multiple hospitalizations [redacted] weeks pregnant for hyperemesis pt put on 4 lbs and controlled on thorazine 25 mg q 6 hr
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
## 2011-01-21 DIAGNOSIS — O210 Mild hyperemesis gravidarum: Secondary | ICD-10-CM

## 2011-01-21 HISTORY — DX: Mild hyperemesis gravidarum: O21.0

## 2011-01-21 NOTE — L&D Delivery Note (Signed)
Delivery Note At 6:17 PM a viable female was delivered via Vaginal, Spontaneous Delivery (Presentation: Left Occiput Anterior).  APGAR: 9, 9; weight 8 lb 15.4 oz (4065 g).   Placenta status: Intact, Spontaneous.  Cord: 3 vessels with the following complications: None.  Cord pH: none  Anesthesia: Epidural  Episiotomy: None Lacerations: None Suture Repair: none Est. Blood Loss (mL): 200  Mom to postpartum.  Baby to nursery-stable.  Zarek Relph A 07/22/2011, 9:11 PM

## 2011-01-27 ENCOUNTER — Inpatient Hospital Stay (HOSPITAL_COMMUNITY)
Admission: AD | Admit: 2011-01-27 | Discharge: 2011-01-27 | Disposition: A | Discharge status: Home / Self Care | Payer: Medicaid Other | Source: Ambulatory Visit | Attending: Obstetrics | Admitting: Obstetrics

## 2011-01-27 ENCOUNTER — Encounter (HOSPITAL_COMMUNITY): Payer: Self-pay | Admitting: *Deleted

## 2011-01-27 DIAGNOSIS — O2340 Unspecified infection of urinary tract in pregnancy, unspecified trimester: Secondary | ICD-10-CM

## 2011-01-27 DIAGNOSIS — O2390 Unspecified genitourinary tract infection in pregnancy, unspecified trimester: Secondary | ICD-10-CM | POA: Insufficient documentation

## 2011-01-27 DIAGNOSIS — N390 Urinary tract infection, site not specified: Secondary | ICD-10-CM | POA: Insufficient documentation

## 2011-01-27 DIAGNOSIS — R109 Unspecified abdominal pain: Secondary | ICD-10-CM | POA: Insufficient documentation

## 2011-01-27 DIAGNOSIS — N949 Unspecified condition associated with female genital organs and menstrual cycle: Secondary | ICD-10-CM

## 2011-01-27 LAB — URINALYSIS, ROUTINE W REFLEX MICROSCOPIC
Bilirubin Urine: NEGATIVE
Ketones, ur: NEGATIVE mg/dL
Nitrite: NEGATIVE
Urobilinogen, UA: 0.2 mg/dL (ref 0.0–1.0)
pH: 5.5 (ref 5.0–8.0)

## 2011-01-27 LAB — DIFFERENTIAL
Basophils Absolute: 0 10*3/uL (ref 0.0–0.1)
Basophils Relative: 0 % (ref 0–1)
Lymphocytes Relative: 23 % (ref 12–46)
Monocytes Absolute: 0.7 10*3/uL (ref 0.1–1.0)
Neutro Abs: 6.8 10*3/uL (ref 1.7–7.7)
Neutrophils Relative %: 69 % (ref 43–77)

## 2011-01-27 LAB — WET PREP, GENITAL
Clue Cells Wet Prep HPF POC: NONE SEEN
Trich, Wet Prep: NONE SEEN
Yeast Wet Prep HPF POC: NONE SEEN

## 2011-01-27 LAB — CBC
HCT: 32.4 % — ABNORMAL LOW (ref 36.0–46.0)
MCHC: 32.4 g/dL (ref 30.0–36.0)
Platelets: 256 10*3/uL (ref 150–400)
RDW: 15.2 % (ref 11.5–15.5)
WBC: 9.9 10*3/uL (ref 4.0–10.5)

## 2011-01-27 LAB — GC/CHLAMYDIA PROBE AMP, GENITAL: Chlamydia, DNA Probe: NEGATIVE

## 2011-01-27 LAB — URINE MICROSCOPIC-ADD ON

## 2011-01-27 MED ORDER — CEPHALEXIN 500 MG PO CAPS: 500.0000 mg | ORAL_CAPSULE | Freq: Four times a day (QID) | ORAL | Status: AC

## 2011-01-27 MED ORDER — PHENAZOPYRIDINE HCL 200 MG PO TABS: 200.0000 mg | ORAL_TABLET | Freq: Three times a day (TID) | ORAL | Status: AC

## 2011-01-27 MED ORDER — PHENAZOPYRIDINE HCL 100 MG PO TABS
100.0000 mg | ORAL_TABLET | Freq: Once | ORAL | Status: AC
  Administered 2011-01-27: 100 mg via ORAL
  Filled 2011-01-27: qty 1

## 2011-01-27 MED ORDER — CEPHALEXIN 500 MG PO CAPS
500.0000 mg | ORAL_CAPSULE | Freq: Once | ORAL | Status: AC
  Administered 2011-01-27: 500 mg via ORAL
  Filled 2011-01-27: qty 1

## 2011-01-27 MED ORDER — ONDANSETRON 8 MG PO TBDP
8.0000 mg | ORAL_TABLET | Freq: Once | ORAL | Status: AC
  Administered 2011-01-27: 8 mg via ORAL
  Filled 2011-01-27: qty 1

## 2011-01-27 MED ORDER — OXYCODONE-ACETAMINOPHEN 5-325 MG PO TABS
1.0000 | ORAL_TABLET | Freq: Once | ORAL | Status: AC
  Administered 2011-01-27: 1 via ORAL
  Filled 2011-01-27: qty 1

## 2011-01-27 NOTE — Progress Notes (Signed)
Pt states she has been in the hospital 3 times for hyperemesis-she states her vomiting is getting bad again-but what has brought her in tonight is the headache and abdominal cramping

## 2011-01-27 NOTE — ED Provider Notes (Signed)
History     CSN: [id number]  Arrival date & time 01/27/11  0011   None     Chief Complaint  Patient presents with  . Abdominal Cramping    HPI Jacqueline Clarke is a 26 y.o. female @ [redacted]w[redacted]d gestation who presents to [facility] for abdominal cramping that started about a week ago. Tonight the pain is worse. Tried warm bath and ibuprofen without relief. Complain of nausea and vomiting and headache. No diarrhea. Last sexual intercourse 4 days ago. The history was provided by the patient.  Past Medical History  Diagnosis Date  . Asthma   . Scoliosis   . Anemia   . Blood type, Rh negative     Past Surgical History  Procedure Date  . Cholecystectomy   . Adenoidectomy     History reviewed. No pertinent family history.  History  Substance Use Topics  . Smoking status: Former Smoker -- 0.2 packs/day  . Smokeless tobacco: Never Used  . Alcohol Use: No    OB History    Grav Para Term Preterm Abortions TAB SAB Ect Mult Living   5 2 2  0 2 0 2 0 0 2      Review of Systems  Constitutional: Positive for appetite change. Negative for fever, chills, diaphoresis and fatigue.  HENT: Positive for ear pain. Negative for congestion, sore throat, facial swelling, neck pain, neck stiffness, dental problem and sinus pressure.   Eyes: Negative for photophobia, pain and discharge.  Respiratory: Negative for cough, chest tightness and wheezing.   Cardiovascular: Negative.   Gastrointestinal: Positive for nausea, vomiting and abdominal pain. Negative for diarrhea, constipation and abdominal distention.  Genitourinary: Negative for dysuria, frequency, flank pain, vaginal bleeding, vaginal discharge and difficulty urinating.  Musculoskeletal: Positive for back pain. Negative for myalgias and gait problem.  Skin: Negative for color change and rash.  Neurological: Positive for light-headedness and headaches. Negative for dizziness, speech difficulty, weakness and numbness.  Psychiatric/Behavioral:  Negative for confusion and agitation. The patient is not nervous/anxious.     Allergies  Review of patient's allergies indicates no known allergies.  Home Medications  No current outpatient prescriptions on file.  BP 112/84  Pulse 84  Temp(Src) 99.4 F (37.4 C) (Oral)  Resp 20  Ht 5\' 9"  (1.753 m)  Wt 258 lb (117.028 kg)  BMI 38.10 kg/m2  SpO2 98%  LMP 10/10/2010  Physical Exam  Nursing note and vitals reviewed. Constitutional: She is oriented to person, place, and time. She appears well-developed and well-nourished.  HENT:  Head: Normocephalic.  Eyes: EOM are normal.  Neck: Neck supple.  Cardiovascular: Normal rate.   Pulmonary/Chest: Effort normal.  Abdominal: Soft. There is tenderness in the suprapubic area. There is no rigidity, no rebound and no guarding.       + FHT. Pain bilateral abdomen with movement. Consistent with round ligament pain.  Genitourinary:       External genitalia without lesions. Mucous discharge with odor. Cervix inflamed, closed, uterus consistent with dates.   Musculoskeletal: Normal range of motion.  Neurological: She is alert and oriented to person, place, and time. No cranial nerve deficit.  Skin: Skin is warm and dry.  Psychiatric: She has a normal mood and affect. Her behavior is normal. Judgment and thought content normal.   Results for orders placed during the hospital encounter of 01/27/11 (from the past 24 hour(s))  URINALYSIS, ROUTINE W REFLEX MICROSCOPIC     Status: Abnormal   Collection Time   01/27/11 12:27  AM      Component Value Range   Color, Urine YELLOW  YELLOW    APPearance CLEAR  CLEAR    Specific Gravity, Urine >1.030 (*) 1.005 - 1.030    pH 5.5  5.0 - 8.0    Glucose, UA NEGATIVE  NEGATIVE (mg/dL)   Hgb urine dipstick NEGATIVE  NEGATIVE    Bilirubin Urine NEGATIVE  NEGATIVE    Ketones, ur NEGATIVE  NEGATIVE (mg/dL)   Protein, ur NEGATIVE  NEGATIVE (mg/dL)   Urobilinogen, UA 0.2  0.0 - 1.0 (mg/dL)   Nitrite NEGATIVE   NEGATIVE    Leukocytes, UA TRACE (*) NEGATIVE   URINE MICROSCOPIC-ADD ON     Status: Abnormal   Collection Time   01/27/11 12:27 AM      Component Value Range   Squamous Epithelial / LPF MANY (*) RARE    WBC, UA 21-50  <3 (WBC/hpf)   Bacteria, UA RARE  RARE    Urine-Other MUCOUS PRESENT    WET PREP, GENITAL     Status: Abnormal   Collection Time   01/27/11  1:10 AM      Component Value Range   Yeast, Wet Prep NONE SEEN  NONE SEEN    Trich, Wet Prep NONE SEEN  NONE SEEN    Clue Cells, Wet Prep NONE SEEN  NONE SEEN    WBC, Wet Prep HPF POC MODERATE (*) NONE SEEN   CBC     Status: Abnormal   Collection Time   01/27/11  1:34 AM      Component Value Range   WBC 9.9  4.0 - 10.5 (K/uL)   RBC 3.95  3.87 - 5.11 (MIL/uL)   Hemoglobin 10.5 (*) 12.0 - 15.0 (g/dL)   HCT 30.8 (*) 65.7 - 46.0 (%)   MCV 82.0  78.0 - 100.0 (fL)   MCH 26.6  26.0 - 34.0 (pg)   MCHC 32.4  30.0 - 36.0 (g/dL)   RDW 84.6  96.2 - 95.2 (%)   Platelets 256  150 - 400 (K/uL)  DIFFERENTIAL     Status: Normal   Collection Time   01/27/11  1:34 AM      Component Value Range   Neutrophils Relative 69  43 - 77 (%)   Neutro Abs 6.8  1.7 - 7.7 (K/uL)   Lymphocytes Relative 23  12 - 46 (%)   Lymphs Abs 2.3  0.7 - 4.0 (K/uL)   Monocytes Relative 7  3 - 12 (%)   Monocytes Absolute 0.7  0.1 - 1.0 (K/uL)   Eosinophils Relative 1  0 - 5 (%)   Eosinophils Absolute 0.1  0.0 - 0.7 (K/uL)   Basophils Relative 0  0 - 1 (%)   Basophils Absolute 0.0  0.0 - 0.1 (K/uL)    Assessment: UTI in pregnancy   Round ligament pain  Plan:  Keflex Rx   Pyridium Rx   Follow up in the office in the am ED Course  Procedures   MDM          Kerrie Buffalo, NP 01/27/11 8413

## 2011-01-28 LAB — URINE CULTURE: Culture  Setup Time: 201301070852

## 2011-02-03 ENCOUNTER — Ambulatory Visit: Payer: Medicaid Other

## 2011-02-12 ENCOUNTER — Ambulatory Visit: Payer: Medicaid Other | Attending: Family Medicine | Admitting: Physical Therapy

## 2011-02-12 DIAGNOSIS — M25569 Pain in unspecified knee: Secondary | ICD-10-CM | POA: Insufficient documentation

## 2011-02-12 DIAGNOSIS — M549 Dorsalgia, unspecified: Secondary | ICD-10-CM | POA: Insufficient documentation

## 2011-02-12 DIAGNOSIS — IMO0001 Reserved for inherently not codable concepts without codable children: Secondary | ICD-10-CM | POA: Insufficient documentation

## 2011-02-19 ENCOUNTER — Ambulatory Visit: Payer: Medicaid Other | Admitting: Physical Therapy

## 2011-02-24 ENCOUNTER — Encounter (HOSPITAL_COMMUNITY): Payer: Self-pay | Admitting: Emergency Medicine

## 2011-02-24 ENCOUNTER — Observation Stay (HOSPITAL_COMMUNITY)
Admission: AD | Admit: 2011-02-24 | Discharge: 2011-02-25 | Disposition: A | Discharge status: Home / Self Care | Payer: Medicaid Other | Source: Ambulatory Visit | Attending: Obstetrics | Admitting: Obstetrics

## 2011-02-24 ENCOUNTER — Inpatient Hospital Stay (HOSPITAL_COMMUNITY): Payer: Medicaid Other

## 2011-02-24 ENCOUNTER — Encounter (HOSPITAL_COMMUNITY): Payer: Self-pay | Admitting: *Deleted

## 2011-02-24 ENCOUNTER — Emergency Department (HOSPITAL_COMMUNITY)
Admission: EM | Admit: 2011-02-24 | Discharge: 2011-02-24 | Disposition: A | Discharge status: Other Acute Inpatient Hospital | Payer: Medicaid Other | Source: Home / Self Care

## 2011-02-24 ENCOUNTER — Emergency Department (HOSPITAL_COMMUNITY): Payer: Medicaid Other

## 2011-02-24 DIAGNOSIS — O99891 Other specified diseases and conditions complicating pregnancy: Principal | ICD-10-CM | POA: Insufficient documentation

## 2011-02-24 DIAGNOSIS — O210 Mild hyperemesis gravidarum: Secondary | ICD-10-CM

## 2011-02-24 DIAGNOSIS — Y92410 Unspecified street and highway as the place of occurrence of the external cause: Secondary | ICD-10-CM | POA: Insufficient documentation

## 2011-02-24 DIAGNOSIS — M4120 Other idiopathic scoliosis, site unspecified: Secondary | ICD-10-CM | POA: Insufficient documentation

## 2011-02-24 DIAGNOSIS — R072 Precordial pain: Secondary | ICD-10-CM | POA: Insufficient documentation

## 2011-02-24 DIAGNOSIS — M542 Cervicalgia: Secondary | ICD-10-CM | POA: Insufficient documentation

## 2011-02-24 DIAGNOSIS — R109 Unspecified abdominal pain: Secondary | ICD-10-CM | POA: Insufficient documentation

## 2011-02-24 DIAGNOSIS — J45909 Unspecified asthma, uncomplicated: Secondary | ICD-10-CM | POA: Insufficient documentation

## 2011-02-24 HISTORY — DX: Dorsalgia, unspecified: M54.9

## 2011-02-24 HISTORY — DX: Other chronic pain: G89.29

## 2011-02-24 HISTORY — DX: Mild hyperemesis gravidarum: O21.0

## 2011-02-24 LAB — WET PREP, GENITAL
Clue Cells Wet Prep HPF POC: NONE SEEN
Yeast Wet Prep HPF POC: NONE SEEN

## 2011-02-24 LAB — CBC
Hemoglobin: 10 g/dL — ABNORMAL LOW (ref 12.0–15.0)
MCH: 26.9 pg (ref 26.0–34.0)
RBC: 3.72 MIL/uL — ABNORMAL LOW (ref 3.87–5.11)

## 2011-02-24 LAB — MRSA PCR SCREENING: MRSA by PCR: NEGATIVE

## 2011-02-24 LAB — KLEIHAUER-BETKE STAIN: Quantitation Fetal Hemoglobin: 0 mL

## 2011-02-24 MED ORDER — PROMETHAZINE HCL 25 MG/ML IJ SOLN
25.0000 mg | Freq: Once | INTRAMUSCULAR | Status: AC
  Administered 2011-02-24: 25 mg via INTRAMUSCULAR
  Filled 2011-02-24: qty 1

## 2011-02-24 MED ORDER — PRENATAL MULTIVITAMIN CH: 1.0000 | ORAL_TABLET | Freq: Every day | ORAL | Status: DC

## 2011-02-24 MED ORDER — ACETAMINOPHEN 500 MG PO TABS: 1000.0000 mg | ORAL_TABLET | Freq: Once | ORAL | Status: DC

## 2011-02-24 MED ORDER — ACETAMINOPHEN 325 MG PO TABS: 650.0000 mg | ORAL_TABLET | ORAL | Status: DC | PRN

## 2011-02-24 MED ORDER — MORPHINE SULFATE 10 MG/ML IJ SOLN
10.0000 mg | Freq: Four times a day (QID) | INTRAMUSCULAR | Status: DC | PRN
  Administered 2011-02-25 (×2): 10 mg via INTRAMUSCULAR
  Filled 2011-02-24 (×2): qty 1

## 2011-02-24 MED ORDER — ACETAMINOPHEN 325 MG PO TABS
975.0000 mg | ORAL_TABLET | Freq: Once | ORAL | Status: AC
  Administered 2011-02-24: 975 mg via ORAL
  Filled 2011-02-24: qty 3

## 2011-02-24 MED ORDER — DOCUSATE SODIUM 100 MG PO CAPS: 100.0000 mg | ORAL_CAPSULE | Freq: Every day | ORAL | Status: DC

## 2011-02-24 MED ORDER — MORPHINE SULFATE 10 MG/ML IJ SOLN
10.0000 mg | Freq: Once | INTRAMUSCULAR | Status: AC
  Administered 2011-02-24: 10 mg via INTRAMUSCULAR
  Filled 2011-02-24: qty 1

## 2011-02-24 MED ORDER — ZOLPIDEM TARTRATE 10 MG PO TABS: 10.0000 mg | ORAL_TABLET | Freq: Every evening | ORAL | Status: DC | PRN

## 2011-02-24 MED ORDER — PROMETHAZINE HCL 25 MG/ML IJ SOLN: 25.0000 mg | Freq: Four times a day (QID) | INTRAMUSCULAR | Status: DC | PRN

## 2011-02-24 MED ORDER — CALCIUM CARBONATE ANTACID 500 MG PO CHEW: 2.0000 | CHEWABLE_TABLET | ORAL | Status: DC | PRN

## 2011-02-24 MED ORDER — SODIUM CHLORIDE 0.9 % IV BOLUS (SEPSIS): 1000.0000 mL | Freq: Once | INTRAVENOUS | Status: DC
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
## 2011-02-24 NOTE — ED Notes (Signed)
Pt transfer delayed . [facility] has been here for 20 minutes .  She is still in xray

## 2011-02-24 NOTE — ED Notes (Signed)
The rapid response ob rn in with the pt.  She is alert
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
## 2011-02-24 NOTE — ED Notes (Signed)
The ob rn has called report to [facility]au and has called for [facility] to transport the pt.  The pt remains alert and in no acute distress.  Dr Clearance Coots will be the admitting doctor at [facility]

## 2011-02-24 NOTE — Progress Notes (Signed)
[facility] arrived to transport pt to MAU.  Report given.
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
## 2011-02-25 DIAGNOSIS — K5289 Other specified noninfective gastroenteritis and colitis: Secondary | ICD-10-CM

## 2011-02-25 MED ORDER — ZOLPIDEM TARTRATE 10 MG PO TABS: 10.0000 mg | ORAL_TABLET | Freq: Every evening | ORAL | Status: DC | PRN

## 2011-02-25 MED ORDER — OXYCODONE-ACETAMINOPHEN 10-325 MG PO TABS: 1.0000 | ORAL_TABLET | Freq: Four times a day (QID) | ORAL | Status: DC | PRN

## 2011-02-25 MED ORDER — DOCUSATE SODIUM 100 MG PO CAPS: 100.0000 mg | ORAL_CAPSULE | Freq: Every day | ORAL | Status: DC

## 2011-02-25 MED ORDER — CALCIUM CARBONATE ANTACID 500 MG PO CHEW: 2.0000 | CHEWABLE_TABLET | ORAL | Status: DC | PRN

## 2011-02-25 MED ORDER — ACETAMINOPHEN 325 MG PO TABS: 650.0000 mg | ORAL_TABLET | ORAL | Status: DC | PRN

## 2011-02-25 MED ORDER — PRENATAL MULTIVITAMIN CH: 1.0000 | ORAL_TABLET | Freq: Every day | ORAL | Status: DC

## 2011-02-25 NOTE — Progress Notes (Signed)
Pt  D/c ambulated pot plan to go home in taxi
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
## 2011-02-25 NOTE — Progress Notes (Signed)
UR Chart review completed.  
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
## 2011-02-26 ENCOUNTER — Ambulatory Visit: Payer: Medicaid Other | Attending: Physical Therapy | Admitting: Physical Therapy

## 2011-02-26 ENCOUNTER — Encounter (HOSPITAL_COMMUNITY): Payer: Self-pay | Admitting: *Deleted

## 2011-02-26 ENCOUNTER — Inpatient Hospital Stay (HOSPITAL_COMMUNITY)
Admission: AD | Admit: 2011-02-26 | Discharge: 2011-02-27 | Disposition: A | Discharge status: Home / Self Care | Payer: Medicaid Other | Source: Ambulatory Visit | Attending: Obstetrics | Admitting: Obstetrics

## 2011-02-26 DIAGNOSIS — O2650 Maternal hypotension syndrome, unspecified trimester: Secondary | ICD-10-CM | POA: Insufficient documentation

## 2011-02-26 DIAGNOSIS — O210 Mild hyperemesis gravidarum: Secondary | ICD-10-CM | POA: Insufficient documentation

## 2011-02-26 DIAGNOSIS — K529 Noninfective gastroenteritis and colitis, unspecified: Secondary | ICD-10-CM

## 2011-02-26 DIAGNOSIS — R55 Syncope and collapse: Secondary | ICD-10-CM

## 2011-02-26 DIAGNOSIS — O99891 Other specified diseases and conditions complicating pregnancy: Secondary | ICD-10-CM | POA: Insufficient documentation

## 2011-02-26 DIAGNOSIS — K5289 Other specified noninfective gastroenteritis and colitis: Secondary | ICD-10-CM | POA: Insufficient documentation

## 2011-02-26 LAB — COMPREHENSIVE METABOLIC PANEL
AST: 28 U/L (ref 0–37)
Albumin: 2.7 g/dL — ABNORMAL LOW (ref 3.5–5.2)
Chloride: 104 mEq/L (ref 96–112)
Creatinine, Ser: 0.58 mg/dL (ref 0.50–1.10)
Total Bilirubin: 0.1 mg/dL — ABNORMAL LOW (ref 0.3–1.2)
Total Protein: 7 g/dL (ref 6.0–8.3)

## 2011-02-26 LAB — URINALYSIS, ROUTINE W REFLEX MICROSCOPIC
Glucose, UA: NEGATIVE mg/dL
Protein, ur: NEGATIVE mg/dL
pH: 6 (ref 5.0–8.0)

## 2011-02-26 LAB — CBC
MCHC: 31.8 g/dL (ref 30.0–36.0)
MCV: 83.7 fL (ref 78.0–100.0)
Platelets: 244 10*3/uL (ref 150–400)
RDW: 15.6 % — ABNORMAL HIGH (ref 11.5–15.5)
WBC: 9.1 10*3/uL (ref 4.0–10.5)

## 2011-02-26 LAB — URINE MICROSCOPIC-ADD ON

## 2011-02-26 MED ORDER — LACTATED RINGERS IV SOLN: Freq: Once | INTRAVENOUS | Status: DC

## 2011-02-26 MED ORDER — LOPERAMIDE HCL 2 MG PO CAPS: 4.0000 mg | ORAL_CAPSULE | Freq: Once | ORAL | Status: DC

## 2011-02-26 MED ORDER — ACETAMINOPHEN 325 MG PO TABS
650.0000 mg | ORAL_TABLET | Freq: Once | ORAL | Status: AC
  Administered 2011-02-26: 650 mg via ORAL
  Filled 2011-02-26: qty 2
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
## 2011-02-26 NOTE — Progress Notes (Signed)
Provider notified of pt complaints at this time

## 2011-02-28 LAB — URINE CULTURE
Colony Count: >=100000
Culture  Setup Time: 201302070126

## 2011-03-05 ENCOUNTER — Ambulatory Visit: Payer: Medicaid Other | Admitting: Physical Therapy

## 2011-03-28 ENCOUNTER — Inpatient Hospital Stay (HOSPITAL_COMMUNITY)
Admission: AD | Admit: 2011-03-28 | Discharge: 2011-03-28 | Disposition: A | Discharge status: Home Health | Payer: Medicaid Other | Source: Ambulatory Visit | Attending: Obstetrics | Admitting: Obstetrics

## 2011-03-28 ENCOUNTER — Encounter (HOSPITAL_COMMUNITY): Payer: Self-pay | Admitting: *Deleted

## 2011-03-28 DIAGNOSIS — O99891 Other specified diseases and conditions complicating pregnancy: Secondary | ICD-10-CM | POA: Insufficient documentation

## 2011-03-28 DIAGNOSIS — O212 Late vomiting of pregnancy: Secondary | ICD-10-CM | POA: Insufficient documentation

## 2011-03-28 DIAGNOSIS — A088 Other specified intestinal infections: Secondary | ICD-10-CM | POA: Insufficient documentation

## 2011-03-28 DIAGNOSIS — K5289 Other specified noninfective gastroenteritis and colitis: Secondary | ICD-10-CM

## 2011-03-28 DIAGNOSIS — K529 Noninfective gastroenteritis and colitis, unspecified: Secondary | ICD-10-CM

## 2011-03-28 LAB — COMPREHENSIVE METABOLIC PANEL
Albumin: 2.5 g/dL — ABNORMAL LOW (ref 3.5–5.2)
Alkaline Phosphatase: 56 U/L (ref 39–117)
BUN: 7 mg/dL (ref 6–23)
Creatinine, Ser: 0.57 mg/dL (ref 0.50–1.10)
Potassium: 3.2 mEq/L — ABNORMAL LOW (ref 3.5–5.1)
Total Protein: 6.2 g/dL (ref 6.0–8.3)

## 2011-03-28 LAB — CBC
HCT: 32.2 % — ABNORMAL LOW (ref 36.0–46.0)
MCHC: 32 g/dL (ref 30.0–36.0)
RDW: 15.4 % (ref 11.5–15.5)

## 2011-03-28 LAB — URINALYSIS, ROUTINE W REFLEX MICROSCOPIC
Bilirubin Urine: NEGATIVE
Hgb urine dipstick: NEGATIVE
Ketones, ur: >80 mg/dL — AB
Nitrite: NEGATIVE
Protein, ur: NEGATIVE mg/dL
Urobilinogen, UA: 2 mg/dL — ABNORMAL HIGH (ref 0.0–1.0)

## 2011-03-28 MED ORDER — PROMETHAZINE HCL 25 MG PO TABS: 25.0000 mg | ORAL_TABLET | Freq: Four times a day (QID) | ORAL | Status: DC | PRN

## 2011-03-28 MED ORDER — PROMETHAZINE HCL 25 MG/ML IJ SOLN
25.0000 mg | Freq: Once | INTRAVENOUS | Status: DC
  Filled 2011-03-28: qty 1

## 2011-03-28 MED ORDER — PROMETHAZINE HCL 25 MG/ML IJ SOLN
25.0000 mg | Freq: Once | INTRAMUSCULAR | Status: AC
  Administered 2011-03-28: 25 mg via INTRAVENOUS
  Filled 2011-03-28: qty 1

## 2011-03-28 MED ORDER — LACTATED RINGERS IV SOLN
INTRAVENOUS | Status: DC
  Administered 2011-03-28: 22:00:00 via INTRAVENOUS

## 2011-03-28 MED ORDER — DEXTROSE 5 % IN LACTATED RINGERS IV BOLUS
1000.0000 mL | Freq: Once | INTRAVENOUS | Status: AC
  Administered 2011-03-28: 1000 mL via INTRAVENOUS

## 2011-03-28 NOTE — ED Provider Notes (Signed)
History   Pt presents today c/o severe N&V, diarrhea, and body aches for the past 3 days. She states she can no longer keep anything on her stomach and she is experiencing a lot of epigastric pain. She denies vag dc, bleeding, SOB, CP, or any other sx at this time.  Chief Complaint  Patient presents with  . Emesis   HPI  OB History    Grav Para Term Preterm Abortions TAB SAB Ect Mult Living   5 2 2  0 2 0 2 0 0 2      Past Medical History  Diagnosis Date  . Asthma   . Scoliosis   . Blood type, Rh negative   . Pregnant state, incidental   . Anemia   . Chronic back pain     scolosis- on percocet  . Hyperemesis complicating pregnancy, antepartum     Zofran PRN    Past Surgical History  Procedure Date  . Cholecystectomy   . Adenoidectomy   . Dilation and curettage of uterus 2008    History reviewed. No pertinent family history.  History  Substance Use Topics  . Smoking status: Former Smoker -- 0.2 packs/day  . Smokeless tobacco: Never Used  . Alcohol Use: No    Allergies: No Known Allergies  Prescriptions prior to admission  Medication Sig Dispense Refill  . acetaminophen (TYLENOL) 325 MG tablet Take 325 mg by mouth every 6 (six) hours as needed. pain      . acetaminophen (TYLENOL) 500 MG tablet Take 500 mg by mouth every 6 (six) hours as needed. pain      . ondansetron (ZOFRAN-ODT) 8 MG disintegrating tablet Take 8 mg by mouth every 8 (eight) hours as needed. For nausea      . oxyCODONE-acetaminophen (PERCOCET) 10-325 MG per tablet Take 1 tablet by mouth every 6 (six) hours as needed. For pain        Review of Systems  Constitutional: Positive for fever, chills and malaise/fatigue. Negative for diaphoresis.  Eyes: Negative for blurred vision and double vision.  Respiratory: Negative for cough, hemoptysis, sputum production, shortness of breath and wheezing.   Cardiovascular: Negative for chest pain and palpitations.  Gastrointestinal: Positive for nausea,  vomiting, abdominal pain and diarrhea. Negative for constipation.  Genitourinary: Negative for dysuria, urgency and frequency.  Neurological: Negative for dizziness and headaches.  Psychiatric/Behavioral: Negative for depression and suicidal ideas.   Physical Exam   Blood pressure 93/60, pulse 110, temperature 100.3 F (37.9 C), temperature source Oral, resp. rate 22, height 5\' 9"  (1.753 m), weight 253 lb (114.76 kg), last menstrual period 10/10/2010, SpO2 98.00%.  Physical Exam  Nursing note and vitals reviewed. Constitutional: She appears well-developed and well-nourished. No distress.  HENT:  Head: Normocephalic and atraumatic.  Eyes: EOM are normal. Pupils are equal, round, and reactive to light.  Skin: She is not diaphoretic.    MAU Course  Procedures  Results for orders placed during the hospital encounter of 03/28/11 (from the past 24 hour(s))  URINALYSIS, ROUTINE W REFLEX MICROSCOPIC     Status: Abnormal   Collection Time   03/28/11  7:25 PM      Component Value Range   Color, Urine YELLOW  YELLOW    APPearance CLEAR  CLEAR    Specific Gravity, Urine 1.025  1.005 - 1.030    pH 6.5  5.0 - 8.0    Glucose, UA NEGATIVE  NEGATIVE (mg/dL)   Hgb urine dipstick NEGATIVE  NEGATIVE    Bilirubin  Urine NEGATIVE  NEGATIVE    Ketones, ur >80 (*) NEGATIVE (mg/dL)   Protein, ur NEGATIVE  NEGATIVE (mg/dL)   Urobilinogen, UA 2.0 (*) 0.0 - 1.0 (mg/dL)   Nitrite NEGATIVE  NEGATIVE    Leukocytes, UA TRACE (*) NEGATIVE   URINE MICROSCOPIC-ADD ON     Status: Abnormal   Collection Time   03/28/11  7:25 PM      Component Value Range   Squamous Epithelial / LPF MANY (*) RARE    WBC, UA 3-6  <3 (WBC/hpf)   RBC / HPF 0-2  <3 (RBC/hpf)   Urine-Other MUCOUS PRESENT    CBC     Status: Abnormal   Collection Time   03/28/11  8:25 PM      Component Value Range   WBC 5.8  4.0 - 10.5 (K/uL)   RBC 3.82 (*) 3.87 - 5.11 (MIL/uL)   Hemoglobin 10.3 (*) 12.0 - 15.0 (g/dL)   HCT 16.1 (*) 09.6 - 46.0 (%)    MCV 84.3  78.0 - 100.0 (fL)   MCH 27.0  26.0 - 34.0 (pg)   MCHC 32.0  30.0 - 36.0 (g/dL)   RDW 04.5  40.9 - 81.1 (%)   Platelets 213  150 - 400 (K/uL)  COMPREHENSIVE METABOLIC PANEL     Status: Abnormal   Collection Time   03/28/11  8:25 PM      Component Value Range   Sodium 134 (*) 135 - 145 (mEq/L)   Potassium 3.2 (*) 3.5 - 5.1 (mEq/L)   Chloride 103  96 - 112 (mEq/L)   CO2 21  19 - 32 (mEq/L)   Glucose, Bld 133 (*) 70 - 99 (mg/dL)   BUN 7  6 - 23 (mg/dL)   Creatinine, Ser 9.14  0.50 - 1.10 (mg/dL)   Calcium 7.7 (*) 8.4 - 10.5 (mg/dL)   Total Protein 6.2  6.0 - 8.3 (g/dL)   Albumin 2.5 (*) 3.5 - 5.2 (g/dL)   AST 14  0 - 37 (U/L)   ALT 6  0 - 35 (U/L)   Alkaline Phosphatase 56  39 - 117 (U/L)   Total Bilirubin 0.4  0.3 - 1.2 (mg/dL)   GFR calc non Af Amer >90  >90 (mL/min)   GFR calc Af Amer >90  >90 (mL/min)   Pt sx have improved following IV hydration and antiemetics.  Assessment and Plan  Viral gastroenteritis: discussed with pt at length. She may use Imodium for diarrhea prn. Will also give Rx for phenergan. She will f/u with her OB provider. Discussed diet, activity, risks, and precautions.  Clinton Gallant. Tykeria Wawrzyniak III, DrHSc, MPAS, PA-C  03/28/2011, 8:24 PM   Harmon Pier, PA 03/28/11 2246

## 2011-03-28 NOTE — Discharge Instructions (Signed)
Viral Gastroenteritis Viral gastroenteritis is also known as stomach flu. This condition affects the stomach and intestinal tract. It can cause sudden diarrhea and vomiting. The illness typically lasts 3 to 8 days. Most people develop an immune response that eventually gets rid of the virus. While this natural response develops, the virus can make you quite ill. CAUSES  Many different viruses can cause gastroenteritis, such as rotavirus or noroviruses. You can catch one of these viruses by consuming contaminated food or water. You may also catch a virus by sharing utensils or other personal items with an infected person or by touching a contaminated surface. SYMPTOMS  The most common symptoms are diarrhea and vomiting. These problems can cause a severe loss of body fluids (dehydration) and a body salt (electrolyte) imbalance. Other symptoms may include:  Fever.   Headache.   Fatigue.   Abdominal pain.  DIAGNOSIS  Your caregiver can usually diagnose viral gastroenteritis based on your symptoms and a physical exam. A stool sample may also be taken to test for the presence of viruses or other infections. TREATMENT  This illness typically goes away on its own. Treatments are aimed at rehydration. The most serious cases of viral gastroenteritis involve vomiting so severely that you are not able to keep fluids down. In these cases, fluids must be given through an intravenous line (IV). HOME CARE INSTRUCTIONS   Drink enough fluids to keep your urine clear or pale yellow. Drink small amounts of fluids frequently and increase the amounts as tolerated.   Ask your caregiver for specific rehydration instructions.   Avoid:   Foods high in sugar.   Alcohol.   Carbonated drinks.   Tobacco.   Juice.   Caffeine drinks.   Extremely hot or cold fluids.   Fatty, greasy foods.   Too much intake of anything at one time.   Dairy products until 24 to 48 hours after diarrhea stops.   You may  consume probiotics. Probiotics are active cultures of beneficial bacteria. They may lessen the amount and number of diarrheal stools in adults. Probiotics can be found in yogurt with active cultures and in supplements.   Wash your hands well to avoid spreading the virus.   Only take over-the-counter or prescription medicines for pain, discomfort, or fever as directed by your caregiver. Do not give aspirin to children. Antidiarrheal medicines are not recommended.   Ask your caregiver if you should continue to take your regular prescribed and over-the-counter medicines.   Keep all follow-up appointments as directed by your caregiver.  SEEK IMMEDIATE MEDICAL CARE IF:   You are unable to keep fluids down.   You do not urinate at least once every 6 to 8 hours.   You develop shortness of breath.   You notice blood in your stool or vomit. This may look like coffee grounds.   You have abdominal pain that increases or is concentrated in one small area (localized).   You have persistent vomiting or diarrhea.   You have a fever.   The patient is a child younger than 3 months, and he or she has a fever.   The patient is a child older than 3 months, and he or she has a fever and persistent symptoms.   The patient is a child older than 3 months, and he or she has a fever and symptoms suddenly get worse.   The patient is a baby, and he or she has no tears when crying.  MAKE SURE YOU:     Understand these instructions.   Will watch your condition.   Will get help right away if you are not doing well or get worse.  Document Released: 01/06/2005 Document Revised: 12/26/2010 Document Reviewed: 10/23/2010 [employer] Patient Information 2012 [employer]. 

## 2011-03-28 NOTE — Progress Notes (Signed)
Pt states she has been vomiting x 3 days-since this morning has had general body aches that have gotten worse and worse

## 2011-04-24 ENCOUNTER — Inpatient Hospital Stay (HOSPITAL_COMMUNITY)
Admission: AD | Admit: 2011-04-24 | Discharge: 2011-04-24 | Disposition: A | Discharge status: Home / Self Care | Payer: Medicaid Other | Source: Ambulatory Visit | Attending: Obstetrics | Admitting: Obstetrics

## 2011-04-24 ENCOUNTER — Other Ambulatory Visit: Payer: Self-pay | Admitting: Obstetrics

## 2011-04-24 DIAGNOSIS — Z2989 Encounter for other specified prophylactic measures: Secondary | ICD-10-CM | POA: Insufficient documentation

## 2011-04-24 DIAGNOSIS — Z298 Encounter for other specified prophylactic measures: Secondary | ICD-10-CM | POA: Insufficient documentation

## 2011-04-24 MED ORDER — RHO D IMMUNE GLOBULIN 1500 UNIT/2ML IJ SOLN
300.0000 ug | Freq: Once | INTRAMUSCULAR | Status: AC
  Administered 2011-04-24: 300 ug via INTRAMUSCULAR
  Filled 2011-04-24: qty 2

## 2011-04-24 NOTE — Plan of Care (Signed)
Information sheet on Rhophylac given to the patient waiting in the lobby. Explained the 1 1/2 hours required to process this order. Patient verbalizes understanding.

## 2011-04-25 LAB — RH IG WORKUP (INCLUDES ABO/RH)
ABO/RH(D): O NEG
Antibody Screen: NEGATIVE
Gestational Age(Wks): 28

## 2011-05-25 ENCOUNTER — Encounter (HOSPITAL_COMMUNITY): Payer: Self-pay

## 2011-05-25 ENCOUNTER — Inpatient Hospital Stay (HOSPITAL_COMMUNITY)
Admission: AD | Admit: 2011-05-25 | Discharge: 2011-05-25 | Disposition: A | Discharge status: Home / Self Care | Payer: Medicaid Other | Source: Ambulatory Visit | Attending: Obstetrics | Admitting: Obstetrics

## 2011-05-25 DIAGNOSIS — O99891 Other specified diseases and conditions complicating pregnancy: Secondary | ICD-10-CM | POA: Insufficient documentation

## 2011-05-25 DIAGNOSIS — R109 Unspecified abdominal pain: Secondary | ICD-10-CM | POA: Insufficient documentation

## 2011-05-25 DIAGNOSIS — M549 Dorsalgia, unspecified: Secondary | ICD-10-CM

## 2011-05-25 DIAGNOSIS — O210 Mild hyperemesis gravidarum: Secondary | ICD-10-CM

## 2011-05-25 LAB — DIFFERENTIAL
Basophils Relative: 0 % (ref 0–1)
Lymphs Abs: 2.2 10*3/uL (ref 0.7–4.0)
Monocytes Relative: 9 % (ref 3–12)
Neutro Abs: 6.4 10*3/uL (ref 1.7–7.7)
Neutrophils Relative %: 66 % (ref 43–77)

## 2011-05-25 LAB — COMPREHENSIVE METABOLIC PANEL
ALT: 6 U/L (ref 0–35)
Albumin: 2.3 g/dL — ABNORMAL LOW (ref 3.5–5.2)
Alkaline Phosphatase: 91 U/L (ref 39–117)
BUN: 9 mg/dL (ref 6–23)
Chloride: 104 mEq/L (ref 96–112)
Glucose, Bld: 96 mg/dL (ref 70–99)
Potassium: 3.7 mEq/L (ref 3.5–5.1)
Sodium: 135 mEq/L (ref 135–145)
Total Bilirubin: 0.2 mg/dL — ABNORMAL LOW (ref 0.3–1.2)

## 2011-05-25 LAB — URINALYSIS, ROUTINE W REFLEX MICROSCOPIC
Bilirubin Urine: NEGATIVE
Glucose, UA: NEGATIVE mg/dL
Hgb urine dipstick: NEGATIVE
Ketones, ur: NEGATIVE mg/dL
pH: 6 (ref 5.0–8.0)

## 2011-05-25 LAB — CBC
Hemoglobin: 9.6 g/dL — ABNORMAL LOW (ref 12.0–15.0)
RBC: 3.63 MIL/uL — ABNORMAL LOW (ref 3.87–5.11)

## 2011-05-25 LAB — URINE MICROSCOPIC-ADD ON

## 2011-05-25 MED ORDER — PROMETHAZINE HCL 25 MG RE SUPP: 25.0000 mg | Freq: Four times a day (QID) | RECTAL | Status: DC | PRN

## 2011-05-25 MED ORDER — CYCLOBENZAPRINE HCL 10 MG PO TABS
10.0000 mg | ORAL_TABLET | Freq: Once | ORAL | Status: AC
  Administered 2011-05-25: 10 mg via ORAL
  Filled 2011-05-25: qty 1

## 2011-05-25 MED ORDER — GI COCKTAIL ~~LOC~~
30.0000 mL | Freq: Once | ORAL | Status: AC
  Administered 2011-05-25: 30 mL via ORAL
  Filled 2011-05-25: qty 30

## 2011-05-25 MED ORDER — CYCLOBENZAPRINE HCL 10 MG PO TABS: 10.0000 mg | ORAL_TABLET | Freq: Three times a day (TID) | ORAL | Status: AC | PRN

## 2011-05-25 NOTE — MAU Note (Signed)
Patient is here with c/o upper abdominal pain that has progressively gotten worse over 3 days. She denies any vaginal bleeding, lof. Reports decreased fetal movement

## 2011-05-25 NOTE — MAU Provider Note (Signed)
History     CSN: [id number]  Arrival date and time: 05/25/11 0230   First Provider Initiated Contact with Patient 05/25/11 [phone]      Chief Complaint  Patient presents with  . Abdominal Pain   HPI This is a 26 y.o. female at [redacted]w[redacted]d who presents with c/o upper abdominal pain for 2 weeks, worse tonight. Reports decrease in appetite. Last BM yesterday, denies diarrhea or constipation. Also has some pelvic bone pain and lower abdominal pressure.   Has had a long history of hyperemesis with this pregnancy. Is doing better now.   RN Note:  Patient is here with c/o upper abdominal pain that has progressively gotten worse over 3 days. She denies any vaginal bleeding, lof. Reports decreased fetal movement  OB History    Grav Para Term Preterm Abortions TAB SAB Ect Mult Living   5 2 2  0 2 0 2 0 0 2      Past Medical History  Diagnosis Date  . Asthma   . Scoliosis   . Blood type, Rh negative   . Pregnant state, incidental   . Anemia   . Chronic back pain     scolosis- on percocet  . Hyperemesis complicating pregnancy, antepartum     Zofran PRN    Past Surgical History  Procedure Date  . Cholecystectomy   . Adenoidectomy   . Dilation and curettage of uterus 2008    History reviewed. No pertinent family history.  History  Substance Use Topics  . Smoking status: Former Smoker -- 0.2 packs/day  . Smokeless tobacco: Never Used  . Alcohol Use: No    Allergies: No Known Allergies  Prescriptions prior to admission  Medication Sig Dispense Refill  . acetaminophen (TYLENOL) 500 MG tablet Take 500 mg by mouth every 6 (six) hours as needed. pain      . ondansetron (ZOFRAN-ODT) 8 MG disintegrating tablet Take 8 mg by mouth every 8 (eight) hours as needed. For nausea      . oxyCODONE-acetaminophen (PERCOCET) 10-325 MG per tablet Take 1 tablet by mouth every 6 (six) hours as needed. For pain        Review of Systems  Constitutional: Negative for fever and chills.    Gastrointestinal: Positive for abdominal pain. Negative for heartburn, nausea, vomiting, diarrhea and constipation.  Genitourinary: Negative for dysuria.    Physical Exam   Blood pressure 127/76, pulse 98, temperature 98.7 F (37.1 C), temperature source Oral, resp. rate 20, height 5\' 10"  (1.778 m), weight 270 lb 8 oz (122.698 kg), last menstrual period 10/10/2010.  Physical Exam  Constitutional: She is oriented to person, place, and time. She appears well-developed and well-nourished. No distress.  HENT:  Head: Normocephalic.  Cardiovascular: Normal rate.   Respiratory: Effort normal.  GI: Soft. She exhibits no distension. There is tenderness. There is no rebound and no guarding.       Tender over epigastrum and bilateral round ligaments Jumps when baby kicks. States always hurts when baby moves. Cervix long/closed. Tender over SP joint  Genitourinary: Vagina normal and uterus normal. No vaginal discharge found.       FHR reactive No contractions  Musculoskeletal: Normal range of motion.  Neurological: She is alert and oriented to person, place, and time.  Skin: Skin is warm and dry.  Psychiatric: She has a normal mood and affect.    Results for orders placed during the hospital encounter of 05/25/11 (from the past 24 hour(s))  URINALYSIS, ROUTINE W REFLEX MICROSCOPIC  Status: Abnormal   Collection Time   05/25/11  2:36 AM      Component Value Range   Color, Urine YELLOW  YELLOW    APPearance CLEAR  CLEAR    Specific Gravity, Urine >1.030 (*) 1.005 - 1.030    pH 6.0  5.0 - 8.0    Glucose, UA NEGATIVE  NEGATIVE (mg/dL)   Hgb urine dipstick NEGATIVE  NEGATIVE    Bilirubin Urine NEGATIVE  NEGATIVE    Ketones, ur NEGATIVE  NEGATIVE (mg/dL)   Protein, ur NEGATIVE  NEGATIVE (mg/dL)   Urobilinogen, UA 0.2  0.0 - 1.0 (mg/dL)   Nitrite NEGATIVE  NEGATIVE    Leukocytes, UA TRACE (*) NEGATIVE   URINE MICROSCOPIC-ADD ON     Status: Normal   Collection Time   05/25/11  2:36 AM       Component Value Range   Squamous Epithelial / LPF RARE  RARE    WBC, UA 3-6  <3 (WBC/hpf)   Bacteria, UA RARE  RARE    Urine-Other MUCOUS PRESENT    CBC     Status: Abnormal   Collection Time   05/25/11  3:52 AM      Component Value Range   WBC 9.6  4.0 - 10.5 (K/uL)   RBC 3.63 (*) 3.87 - 5.11 (MIL/uL)   Hemoglobin 9.6 (*) 12.0 - 15.0 (g/dL)   HCT 08.6 (*) 57.8 - 46.0 (%)   MCV 83.7  78.0 - 100.0 (fL)   MCH 26.4  26.0 - 34.0 (pg)   MCHC 31.6  30.0 - 36.0 (g/dL)   RDW 46.9  62.9 - 52.8 (%)   Platelets 216  150 - 400 (K/uL)  DIFFERENTIAL     Status: Normal   Collection Time   05/25/11  3:52 AM      Component Value Range   Neutrophils Relative 66  43 - 77 (%)   Neutro Abs 6.4  1.7 - 7.7 (K/uL)   Lymphocytes Relative 23  12 - 46 (%)   Lymphs Abs 2.2  0.7 - 4.0 (K/uL)   Monocytes Relative 9  3 - 12 (%)   Monocytes Absolute 0.8  0.1 - 1.0 (K/uL)   Eosinophils Relative 2  0 - 5 (%)   Eosinophils Absolute 0.2  0.0 - 0.7 (K/uL)   Basophils Relative 0  0 - 1 (%)   Basophils Absolute 0.0  0.0 - 0.1 (K/uL)  COMPREHENSIVE METABOLIC PANEL     Status: Abnormal   Collection Time   05/25/11  3:52 AM      Component Value Range   Sodium 135  135 - 145 (mEq/L)   Potassium 3.7  3.5 - 5.1 (mEq/L)   Chloride 104  96 - 112 (mEq/L)   CO2 23  19 - 32 (mEq/L)   Glucose, Bld 96  70 - 99 (mg/dL)   BUN 9  6 - 23 (mg/dL)   Creatinine, Ser 4.13  0.50 - 1.10 (mg/dL)   Calcium 9.0  8.4 - 24.4 (mg/dL)   Total Protein 6.7  6.0 - 8.3 (g/dL)   Albumin 2.3 (*) 3.5 - 5.2 (g/dL)   AST 14  0 - 37 (U/L)   ALT 6  0 - 35 (U/L)   Alkaline Phosphatase 91  39 - 117 (U/L)   Total Bilirubin 0.2 (*) 0.3 - 1.2 (mg/dL)   GFR calc non Af Amer >90  >90 (mL/min)   GFR calc Af Amer >90  >90 (mL/min)  MAU Course  Procedures  MDM Will check CBC and CMET to make sure she doesn't have appendicitis.  >> :abs normal Was given GI cocktail with improvement in upper abdominal pain. Lower "pressure" remains. She thinks it  may be wrapping around from her back pain. Flexeril given (has never tried it).   Assessment and Plan  A:  [facility] at [redacted]w[redacted]d      Upper abdominal pain probably GERD or intestinal gas      No evidence of appy or liver disturbance.      Long term back pain, possibly referring to abdomen P:  Discharge home       Rx Flexeril given.      Back exercises      Followup in office  [facility] 05/25/2011, 3:27 AM

## 2011-05-25 NOTE — Discharge Instructions (Signed)
Back Exercises Back exercises help treat and prevent back injuries. The goal of back exercises is to increase the strength of your abdominal and back muscles and the flexibility of your back. These exercises should be started when you no longer have back pain. Back exercises include:  Pelvic Tilt. Lie on your back with your knees bent. Tilt your pelvis until the lower part of your back is against the floor. Hold this position 5 to 10 sec and repeat 5 to 10 times.   Knee to Chest. Pull first 1 knee up against your chest and hold for 20 to 30 seconds, repeat this with the other knee, and then both knees. This may be done with the other leg straight or bent, whichever feels better.   Sit-Ups or Curl-Ups. Bend your knees 90 degrees. Start with tilting your pelvis, and do a partial, slow sit-up, lifting your trunk only 30 to 45 degrees off the floor. Take at least 2 to 3 seconds for each sit-up. Do not do sit-ups with your knees out straight. If partial sit-ups are difficult, simply do the above but with only tightening your abdominal muscles and holding it as directed.   Hip-Lift. Lie on your back with your knees flexed 90 degrees. Push down with your feet and shoulders as you raise your hips a couple inches off the floor; hold for 10 seconds, repeat 5 to 10 times.   Back arches. Lie on your stomach, propping yourself up on bent elbows. Slowly press on your hands, causing an arch in your low back. Repeat 3 to 5 times. Any initial stiffness and discomfort should lessen with repetition over time.   Shoulder-Lifts. Lie face down with arms beside your body. Keep hips and torso pressed to floor as you slowly lift your head and shoulders off the floor.  Do not overdo your exercises, especially in the beginning. Exercises may cause you some mild back discomfort which lasts for a few minutes; however, if the pain is more severe, or lasts for more than 15 minutes, do not continue exercises until you see your  caregiver. Improvement with exercise therapy for back problems is slow.  See your caregivers for assistance with developing a proper back exercise program. Document Released: 02/14/2004 Document Revised: 12/26/2010 Document Reviewed: 01/06/2005 [employer] Patient Information 2012 [employer]. 

## 2011-06-05 ENCOUNTER — Inpatient Hospital Stay (HOSPITAL_COMMUNITY)
Admission: AD | Admit: 2011-06-05 | Discharge: 2011-06-06 | DRG: 781 | Disposition: A | Discharge status: Home / Self Care | Payer: Medicaid Other | Source: Ambulatory Visit | Attending: Obstetrics | Admitting: Obstetrics

## 2011-06-05 ENCOUNTER — Encounter (HOSPITAL_COMMUNITY): Payer: Self-pay | Admitting: *Deleted

## 2011-06-05 DIAGNOSIS — O212 Late vomiting of pregnancy: Principal | ICD-10-CM | POA: Diagnosis present

## 2011-06-05 DIAGNOSIS — O210 Mild hyperemesis gravidarum: Secondary | ICD-10-CM

## 2011-06-05 MED ORDER — DEXTROSE 5 % IN LACTATED RINGERS IV BOLUS
1000.0000 mL | Freq: Once | INTRAVENOUS | Status: AC
  Administered 2011-06-05: 1000 mL via INTRAVENOUS

## 2011-06-05 MED ORDER — PROMETHAZINE HCL 25 MG/ML IJ SOLN
25.0000 mg | Freq: Once | INTRAMUSCULAR | Status: AC
  Administered 2011-06-05: 25 mg via INTRAVENOUS
  Filled 2011-06-05: qty 1

## 2011-06-05 MED ORDER — ZOLPIDEM TARTRATE 10 MG PO TABS
10.0000 mg | ORAL_TABLET | Freq: Every evening | ORAL | Status: DC | PRN
  Administered 2011-06-06: 10 mg via ORAL
  Filled 2011-06-05: qty 1

## 2011-06-05 MED ORDER — ONDANSETRON HCL 4 MG/2ML IJ SOLN: 4.0000 mg | INTRAMUSCULAR | Status: DC | PRN

## 2011-06-05 MED ORDER — DIPHENHYDRAMINE HCL 50 MG/ML IJ SOLN
25.0000 mg | Freq: Once | INTRAMUSCULAR | Status: AC
  Administered 2011-06-05: 25 mg via INTRAVENOUS
  Filled 2011-06-05: qty 1

## 2011-06-05 MED ORDER — ONDANSETRON HCL 4 MG/2ML IJ SOLN
4.0000 mg | Freq: Once | INTRAMUSCULAR | Status: AC
  Administered 2011-06-05: 4 mg via INTRAVENOUS
  Filled 2011-06-05: qty 2

## 2011-06-05 MED ORDER — LACTATED RINGERS IV SOLN
INTRAVENOUS | Status: DC
  Administered 2011-06-05 – 2011-06-06 (×3): via INTRAVENOUS

## 2011-06-05 MED ORDER — METOCLOPRAMIDE HCL 5 MG/ML IJ SOLN
10.0000 mg | Freq: Once | INTRAMUSCULAR | Status: AC
  Administered 2011-06-05: 10 mg via INTRAVENOUS
  Filled 2011-06-05: qty 2

## 2011-06-05 MED ORDER — DEXAMETHASONE SODIUM PHOSPHATE 10 MG/ML IJ SOLN
10.0000 mg | Freq: Once | INTRAMUSCULAR | Status: AC
  Administered 2011-06-05: 10 mg via INTRAVENOUS
  Filled 2011-06-05: qty 1

## 2011-06-05 MED ORDER — THIAMINE HCL 100 MG/ML IJ SOLN
Freq: Once | INTRAVENOUS | Status: AC
  Administered 2011-06-05: 18:00:00 via INTRAVENOUS
  Filled 2011-06-05: qty 1000

## 2011-06-05 NOTE — MAU Provider Note (Signed)
  History     CSN: [id number]  Arrival date and time: 06/05/11 1710   None     Chief Complaint  Patient presents with  . Hyperemesis Gravidarum   HPI Pt is [id number] [redacted] weeks pregnant and is sent from Dr. Verdell Carmine office for IVF w/mvt and Zofran for hyperemesis and dehydration.  She has been taking Zofran ODT.  She has not been able to keep anything down for 3 to 4 days.     Past Medical History  Diagnosis Date  . Asthma   . Scoliosis   . Blood type, Rh negative   . Pregnant state, incidental   . Anemia   . Chronic back pain     scolosis- on percocet  . Hyperemesis complicating pregnancy, antepartum     Zofran PRN    Past Surgical History  Procedure Date  . Cholecystectomy   . Adenoidectomy   . Dilation and curettage of uterus 2008    No family history on file.  History  Substance Use Topics  . Smoking status: Former Smoker -- 0.2 packs/day  . Smokeless tobacco: Never Used  . Alcohol Use: No    Allergies: No Known Allergies  Prescriptions prior to admission  Medication Sig Dispense Refill  . acetaminophen (TYLENOL) 500 MG tablet Take 500 mg by mouth every 6 (six) hours as needed. pain      . cyclobenzaprine (FLEXERIL) 10 MG tablet Take 1 tablet (10 mg total) by mouth 3 (three) times daily as needed for muscle spasms.  30 tablet  0  . ondansetron (ZOFRAN-ODT) 8 MG disintegrating tablet Take 8 mg by mouth every 8 (eight) hours as needed. For nausea      . oxyCODONE-acetaminophen (PERCOCET) 10-325 MG per tablet Take 1 tablet by mouth every 6 (six) hours as needed. For pain        ROS Physical Exam   Blood pressure 102/54, pulse 90, temperature 99 F (37.2 C), resp. rate 16, height 5\' 7"  (1.702 m), weight 268 lb (121.564 kg), last menstrual period 10/10/2010.  Physical Exam  Vitals reviewed. Constitutional: She is oriented to person, place, and time. She appears well-developed and well-nourished.  HENT:  Head: Normocephalic.  Eyes: Pupils are equal, round,  and reactive to light.  Neck: Normal range of motion.  Cardiovascular: Normal rate.   Respiratory: Effort normal.  GI: Soft.       Reactive NST, no ctx noted  Musculoskeletal: Normal range of motion.  Neurological: She is alert and oriented to person, place, and time.  Skin: Skin is warm and dry. There is pallor.  Psychiatric: She has a normal mood and affect.    MAU Course  Procedures Pt still feeling nauseated after 500cc of banana bag and Zofran 4mg  IV.  Phenergan 25mg  IV ordered. Pt still feeling bad having emesis of fluid and complains of a headache she has had for a week Will give Reglan, Benadryl, and Decadron with IV D5LR 1000cc in hopes that pt will feel better and be able to go home  Care turned over to Georges Mouse, CNM Assessment and Plan    LINEBERRY,SUSAN 06/05/2011, 5:29 PM   Pt states headache improved after Reglan, Benadryl and Decadron. Tried PO juice and crackers, vomited again. Dr. Gaynell Face consulted, will admit for observation overnight.

## 2011-06-05 NOTE — MAU Note (Signed)
Patient sent over from MD office for IVF for hyperemesis has not been able to keep anything down for 3 to 4 days.

## 2011-06-06 ENCOUNTER — Encounter (HOSPITAL_COMMUNITY): Payer: Self-pay | Admitting: *Deleted

## 2011-06-06 LAB — MRSA PCR SCREENING: MRSA by PCR: NEGATIVE

## 2011-06-06 LAB — COMPREHENSIVE METABOLIC PANEL
ALT: 6 U/L (ref 0–35)
Albumin: 2.2 g/dL — ABNORMAL LOW (ref 3.5–5.2)
BUN: 7 mg/dL (ref 6–23)
Calcium: 8.6 mg/dL (ref 8.4–10.5)
GFR calc Af Amer: >90 mL/min (ref >90)
Glucose, Bld: 97 mg/dL (ref 70–99)
Sodium: 133 mEq/L — ABNORMAL LOW (ref 135–145)
Total Protein: 5.8 g/dL — ABNORMAL LOW (ref 6.0–8.3)

## 2011-06-06 MED ORDER — OXYCODONE-ACETAMINOPHEN 5-325 MG PO TABS
2.0000 | ORAL_TABLET | ORAL | Status: DC | PRN
  Administered 2011-06-06: 2 via ORAL
  Filled 2011-06-06: qty 2

## 2011-06-06 MED ORDER — SODIUM CHLORIDE 0.9 % IV SOLN
25.0000 mg | Freq: Four times a day (QID) | INTRAVENOUS | Status: DC | PRN
  Filled 2011-06-06 (×2): qty 1

## 2011-06-06 MED ORDER — CHLORPROMAZINE HCL 10 MG PO TABS
10.0000 mg | ORAL_TABLET | Freq: Four times a day (QID) | ORAL | Status: DC | PRN
  Filled 2011-06-06: qty 1

## 2011-06-06 MED ORDER — SODIUM CHLORIDE 0.9 % IV SOLN
25.0000 mg | Freq: Four times a day (QID) | INTRAVENOUS | Status: DC | PRN
  Administered 2011-06-06: 25 mg via INTRAVENOUS
  Filled 2011-06-06: qty 1

## 2011-06-06 NOTE — Progress Notes (Signed)
Admitted at  34 1/[redacted] weeks gestation, with nausea.  Height  70" Weight 267 Lbs pre-pregnancy weight 243 Lbs .Pre-pregnancy  BMI 34.9  IBW 150 Lbs  Total weight gain 24 Lbs. Weight gain goals 11-20.   Estimated needs: 21-2300 kcal/day, 75-85 grams protein/day, 2.3 liters fluid/day Nausea resolving. Has tolerated clears well today Antenatal Regular diet prescription will provide for increased needs. No abnormal nutrition related labs  Nutrition Dx: Increased nutrient needs r/t pregnancy and fetal growth requirements aeb [redacted] weeks gestation.  No educational needs assessed at this time.

## 2011-06-06 NOTE — Progress Notes (Signed)
Patient ID: Jacqueline Clarke, female   DOB: [birth year], 26 y.o.   MRN: [id number] Hospital Day: 2  S: Preterm labor symptoms: none  O: Blood pressure 104/50, pulse 99, temperature 98.4 F (36.9 C), temperature source Oral, resp. rate 18, height 5\' 10"  (1.778 m), weight 119.886 kg (264 lb 4.8 oz), last menstrual period 10/10/2010.   NWG:NFAOZHYQ: 150 bpm Toco: None SVE:   A/P- 26 y.o. admitted with:  Nausea, vomiting.  Inability to keep anything down.  S/P IV fluid hydration and antiemetic Rx overnight.  Now able to tolerate diet and has had no vomiting today.  Will therefore discharge home and follow up in office.    Pregnancy Complications: None  Preterm labor management: no treatment necessary Dating:  [redacted]w[redacted]d PNL Needed:  none FWB:  good PTL:  stable ROD: n/a

## 2011-06-06 NOTE — Progress Notes (Signed)
Patient ID: Jacqueline Clarke, female   DOB: 09/17/1985, 26 y.o.   MRN: [id number] Hospital Day: 2  S: Preterm labor symptoms: n/a; c/o nausea  O: Blood pressure 107/63, pulse 91, temperature 98.4 F (36.9 C), temperature source Oral, resp. rate 20, height 5\' 10"  (1.778 m), weight 121.11 kg (267 lb), last menstrual period 10/10/2010.   BJY:NWGNFAOZ: 140 bpm and Variability: Good {> 6 bpm) Toco: None SVE: deferred  A/P- 26 y.o. admitted with:  Patient Active Hospital Problem List: Hyperemesis complicating pregnancy, antepartum (12/20/2010)   Assessment: Tolerating some po; persistent nausea   Plan: D/C Zofran-->Chlorpromazine;  Nutritionist consult; diet as tolerated   Pregnancy Complications: hyperemesis  Preterm labor management: no treatment necessary Dating:  [redacted]w[redacted]d  FWB:  NST reassuring

## 2011-06-06 NOTE — Discharge Summary (Signed)
Physician Discharge Summary  Patient ID: Jacqueline Clarke MRN: [id number] DOB/AGE: 08/14/1985 26 y.o.  Admit date: 06/05/2011 Discharge date: 06/06/2011  Admission Diagnoses:  [redacted] weeks gestation.  Hyperemesis Gravidarum  Discharge Diagnoses:  Same Principal Problem:  *Hyperemesis complicating pregnancy, antepartum   Discharged Condition: good  Hospital Course: Admitted with N/V for 2 days and could not keep anything down.  C/O feeling weak.  Consults: None  Significant Diagnostic Studies: labs: CBC and CMET.  Treatments: IV hydration and antiemetics.  Discharge Exam: Blood pressure 104/50, pulse 99, temperature 98.4 F (36.9 C), temperature source Oral, resp. rate 18, height 5\' 10"  (1.778 m), weight 119.886 kg (264 lb 4.8 oz), last menstrual period 10/10/2010. General appearance: alert and no distress GI: soft, non-tender; bowel sounds normal; no masses,  no organomegaly Extremities: extremities normal, atraumatic, no cyanosis or edema Skin: Skin color, texture, turgor normal. No rashes or lesions  Disposition: 01-Home or Self Care  Discharge Orders    Future Orders Please Complete By Expires   Discharge instructions      Comments:   Routine for hyperemesis gravidarum.   PRETERM LABOR:  Includes any of the follwing symptoms that occur between 20 - [redacted] weeks gestation.  If these symptoms are not stopped, preterm labor can result in preterm delivery, placing your baby at risk      Notify physician for menstrual like cramps      Notify physician for uterine contractions.  These may be painless and feel like the uterus is tightening or the baby is  "balling up"      Notify physician for low, dull backache, unrelieved by heat or Tylenol      Notify physician for intestinal cramps, with or without diarrhea, sometimes described as "gas pain"      Notify physician for pelvic pressure      Notify physician for increase or change in vaginal discharge      Notify physician for  vaginal bleeding      Notify physician for a general feeling that "something is not right"      Notify physician for leaking of fluid      Fetal Kick Count:  Lie on our left side for one hour after a meal, and count the number of times your baby kicks.  If it is less than 5 times, get up, move around and drink some juice.  Repeat the test 30 minutes later.  If it is still less than 5 kicks in an hour, notify your doctor.      Discharge activity:      Discharge diet:      No sexual activity restrictions      Discharge patient        Medication List  As of 06/06/2011  3:37 PM   STOP taking these medications         acetaminophen 500 MG tablet      cyclobenzaprine 10 MG tablet         TAKE these medications         ondansetron 8 MG disintegrating tablet   Commonly known as: ZOFRAN-ODT   Take 8 mg by mouth every 8 (eight) hours as needed. For nausea      oxyCODONE-acetaminophen 10-325 MG per tablet   Commonly known as: PERCOCET   Take 1 tablet by mouth every 6 (six) hours as needed. For pain           Follow-up Information    Follow up with Drewey Begue A,  MD. Schedule an appointment as soon as possible for a visit in 1 week.   Contact information:   [address] [place] [address] [phone]          Signed: Brock Bad 06/06/2011, 3:37 PM

## 2011-06-06 NOTE — Progress Notes (Signed)
UR Chart review completed.  

## 2011-06-06 NOTE — Discharge Instructions (Signed)
Hyperemesis Gravidarum Diet  Hyperemesis gravidarum is a severe form of morning sickness. It is characterized by frequent and severe vomiting. It happens during the first trimester of pregnancy. It may be caused by the rapid hormone changes that happen during pregnancy. It is associated with a 5% weight loss of pre-pregnancy weight. The hyperemesis diet may be used to lessen symptoms of nausea and vomiting.  EATING GUIDELINES  · Eat 5 to 6 small meals daily instead of 3 large meals.  · Avoid foods with strong smells.  · Avoid drinking 30 minutes before and after meals.  · Avoid fried or high-fat foods, such as butter and cream sauces.  · Starchy foods are usually well-tolerated, such as cereal, toast, bread, potatoes, pasta, rice, and pretzels.  · Eat crackers before you get out of bed in the morning.  · Avoid spicy foods.  · Ginger may help with nausea. Add ¼ tsp ginger to hot tea or choose ginger tea.  · Continue to take your prenatal vitamins as directed by your caregiver.  SAMPLE MEAL PLAN  Breakfast   · ½ cup oatmeal  · 1 slice toast  · 1 tsp heart-healthy margarine  · 1 tsp jelly  · 1 scrambled egg  Midmorning Snack   · 1 cup low-fat yogurt  Lunch   · Plain ham sandwich  · Carrot or celery sticks  · 1 small apple  · 3 graham crackers  Midafternoon Snack   · Cheese and crackers  Dinner  · 4 oz pork tenderloin  · 1 small baked potato  · 1 tsp margarine  · ½ cup broccoli  · ½ cup grapes  Evening Snack  · 1 cup pudding  Document Released: 11/03/2006 Document Revised: 12/26/2010 Document Reviewed: 05/03/2010  ExitCare® Patient Information ©2012 [employer].  Hyperemesis Gravidarum  Hyperemesis gravidarum is a severe form of nausea and vomiting that happens during pregnancy. Hyperemesis is worse than morning sickness. It may cause a woman to have nausea or vomiting all day for many days. It may keep a woman from eating and drinking enough food and liquids. Hyperemesis usually occurs during the first half (the  first 20 weeks) of pregnancy. It often goes away once a woman is in her second half of pregnancy. However, sometimes hyperemesis continues through an entire pregnancy.   CAUSES   The cause of this condition is not completely known but is thought to be due to changes in the body's hormones when pregnant. It could be the high level of the pregnancy hormone or an increase in estrogen in the body.   SYMPTOMS   · Severe nausea and vomiting.  · Nausea that does not go away.  · Vomiting that does not allow you to keep any food down.  · Weight loss and body fluid loss (dehydration).  · Having no desire to eat or not liking food you have previously enjoyed.  DIAGNOSIS   Your caregiver may ask you about your symptoms. Your caregiver may also order blood tests and urine tests to make sure something else is not causing the problem.   TREATMENT   You may only need medicine to control the problem. If medicines do not control the nausea and vomiting, you will be treated in the hospital to prevent dehydration, acidosis, weight loss, and changes in the electrolytes in your body that may harm the unborn baby (fetus). You may need intravenous (IV) fluids.   HOME CARE INSTRUCTIONS   · Take all medicine as directed   by your caregiver.  · Try eating a couple of dry crackers or toast in the morning before getting out of bed.  · Avoid foods and smells that upset your stomach.  · Avoid fatty and spicy foods. Eat 5 to 6 small meals a day.  · Do not drink when eating meals. Drink between meals.  · For snacks, eat high protein foods, such as cheese. Eat or suck on things that have ginger in them. Ginger helps nausea.  · Avoid food preparation. The smell of food can spoil your appetite.  · Avoid iron pills and iron in your multivitamins until after 3 to 4 months of being pregnant.  SEEK MEDICAL CARE IF:   · Your abdominal pain increases since the last time you saw your caregiver.  · You have a severe headache.  · You develop vision  problems.  · You feel you are losing weight.  SEEK IMMEDIATE MEDICAL CARE IF:   · You are unable to keep fluids down.  · You vomit blood.  · You have constant nausea and vomiting.  · You have a fever.  · You have excessive weakness, dizziness, fainting, or extreme thirst.  MAKE SURE YOU:   · Understand these instructions.  · Will watch your condition.  · Will get help right away if you are not doing well or get worse.  Document Released: 01/06/2005 Document Revised: 12/26/2010 Document Reviewed: 04/08/2010  ExitCare® Patient Information ©2012 [employer].

## 2011-06-12 ENCOUNTER — Encounter (HOSPITAL_COMMUNITY): Payer: Self-pay | Admitting: *Deleted

## 2011-06-12 ENCOUNTER — Inpatient Hospital Stay (HOSPITAL_COMMUNITY)
Admission: AD | Admit: 2011-06-12 | Discharge: 2011-06-12 | Disposition: A | Discharge status: Home / Self Care | Payer: Medicaid Other | Source: Ambulatory Visit | Attending: Obstetrics | Admitting: Obstetrics

## 2011-06-12 DIAGNOSIS — O99891 Other specified diseases and conditions complicating pregnancy: Secondary | ICD-10-CM | POA: Insufficient documentation

## 2011-06-12 DIAGNOSIS — G43109 Migraine with aura, not intractable, without status migrainosus: Secondary | ICD-10-CM

## 2011-06-12 HISTORY — DX: Headache: R51

## 2011-06-12 MED ORDER — PROCHLORPERAZINE EDISYLATE 5 MG/ML IJ SOLN
10.0000 mg | Freq: Once | INTRAMUSCULAR | Status: AC
  Administered 2011-06-12: 10 mg via INTRAVENOUS
  Filled 2011-06-12: qty 2

## 2011-06-12 MED ORDER — DIPHENHYDRAMINE HCL 50 MG/ML IJ SOLN
25.0000 mg | Freq: Once | INTRAMUSCULAR | Status: AC
  Administered 2011-06-12: 25 mg via INTRAVENOUS
  Filled 2011-06-12: qty 1

## 2011-06-12 MED ORDER — LACTATED RINGERS IV BOLUS (SEPSIS)
1000.0000 mL | Freq: Once | INTRAVENOUS | Status: AC
  Administered 2011-06-12: 1000 mL via INTRAVENOUS

## 2011-06-12 MED ORDER — DEXAMETHASONE SODIUM PHOSPHATE 10 MG/ML IJ SOLN
10.0000 mg | Freq: Once | INTRAMUSCULAR | Status: AC
  Administered 2011-06-12: 10 mg via INTRAVENOUS
  Filled 2011-06-12: qty 1

## 2011-06-12 NOTE — Discharge Instructions (Signed)
Recurrent Migraine Headache You have a recurrent migraine headache. The caregiver can usually provide good relief for this headache. If this headache is the same as your previous migraine headaches, it is safe to treat you without repeating a complete evaluation.   These headaches usually have at least two of the following problems:   They occur on one side of the head, pulsate, and are severe enough to prevent daily activities.   They are aggravated by daily physical activities.  You may have one or more of the following symptoms:   Nausea (feeling sick to your stomach).   Vomiting.   Pain with exposure to bright lights or loud noises.  Most headache sufferers have a family history of migraines. Your headaches may also be related to alcohol and smoking habits. Too much sleep, too little sleep, mood, and anxiety may also play a part. Changing some of these triggers may help you lower the number and level of pain of the headaches. Headaches may be related to menses (female menstruation). There are numerous medications that can prevent these headaches. Your caregiver can help you with a medication or regimen (procedure to follow). If this has been a chronic (long-term) condition, the use of long-term narcotics is not recommended. Using long-term narcotics can cause recurrent migraines. Narcotics are only a temporary measure only. They are used for the infrequent migraine that fails to respond to all other measures. SEEK MEDICAL CARE IF:    You do not get relief from the medications given to you.   You have a recurrence of pain.   This headache begins to differ from past migraine (for example if it is more severe).  SEEK IMMEDIATE MEDICAL CARE IF:  You have a fever.   You have a stiff neck.   You have vision loss or have changes in vision.   You have problems with feeling lightheaded, become faint, or lose your balance.   You have muscular weakness.   You have loss of muscular  control.   You develop severe symptoms different from your first symptoms.   You start losing your balance or have trouble walking.   You feel faint or pass out.  MAKE SURE YOU:    Understand these instructions.   Will watch your condition.   Will get help right away if you are not doing well or get worse.  Document Released: 10/01/2000 Document Revised: 12/26/2010 Document Reviewed: 08/26/2007 ExitCare Patient Information 2012 [employer]. 

## 2011-06-12 NOTE — MAU Provider Note (Signed)
Chief Complaint:  Headache   First Provider Initiated Contact with Patient 06/12/11 1548      HPI  Jacqueline Clarke is a 26 y.o. [id number] at [redacted]w[redacted]d presenting with migraine, scotoma and nausea C/W prior migraines. Has not resolved w/ multiple doses of Tylenol. Denies weakness, neck pain, injury, difficulty w/ speech or gait. Rates pain 10/10. Generalized, throbbing HA. Denies contractions, leakage of fluid or vaginal bleeding. Good fetal movement.   Patient Active Problem List  Diagnoses  . Hyperemesis complicating pregnancy, antepartum   Past Medical History: Past Medical History  Diagnosis Date  . Asthma   . Scoliosis   . Blood type, Rh negative   . Pregnant state, incidental   . Anemia   . Chronic back pain     scolosis- on percocet  . Hyperemesis complicating pregnancy, antepartum     Zofran PRN  . Headache     Past Surgical History: Past Surgical History  Procedure Date  . Cholecystectomy   . Adenoidectomy   . Dilation and curettage of uterus 2008    Family History: Family History  Problem Relation Age of Onset  . Heart disease Maternal Grandfather   . Diabetes Maternal Grandfather     Social History: History  Substance Use Topics  . Smoking status: Former Smoker -- 0.2 packs/day  . Smokeless tobacco: Never Used  . Alcohol Use: No    Allergies: No Known Allergies  Meds:  Prescriptions prior to admission  Medication Sig Dispense Refill  . acetaminophen (TYLENOL) 500 MG tablet Take 500 mg by mouth every 6 (six) hours as needed. migrains      . ondansetron (ZOFRAN-ODT) 8 MG disintegrating tablet Take 8 mg by mouth every 8 (eight) hours as needed. For nausea      . Prenatal Vit-Fe Fumarate-FA (PRENATAL MULTIVITAMIN) TABS Take 1 tablet by mouth daily.      Marland Kitchen oxyCODONE-acetaminophen (PERCOCET) 10-325 MG per tablet Take 1 tablet by mouth every 6 (six) hours as needed. For pain, chronic migrains          Physical Exam  Blood pressure 118/75, pulse 97,  temperature 98.2 F (36.8 C), temperature source Oral, resp. rate 18, last menstrual period 10/10/2010. GENERAL: Well-developed, well-nourished female in no acute distress.  HEENT: normocephalic, good dentition HEART: normal rate RESP: normal effort ABDOMEN: Soft, nontender, nondistended, gravid.  EXTREMITIES: Nontender, no edema NEURO: alert and oriented  SPECULUM EXAM: Deferred    FHT:  Baseline 140 , moderate variability, accelerations present, no decelerations Contractions: none   Migraine resolved w/ IV fluids, Benadryl, Compazine  Labs: NA  Imaging:  NA  Assessment: 1. Migraine with aura and without status migrainosus, not intractable    Plan: D/C home Follow up w/ Dr. Clearance Coots as scheduled  [place], [place] 5/23/20135:14 PM

## 2011-06-12 NOTE — MAU Note (Signed)
Pt C/O HA x 1 month, has become severe for last 3 days, saw MD today, sent to MAU for HA.  Pt has hx of migraines

## 2011-06-16 LAB — OB RESULTS CONSOLE GBS: GBS: POSITIVE

## 2011-07-02 ENCOUNTER — Inpatient Hospital Stay (HOSPITAL_COMMUNITY)
Admission: AD | Admit: 2011-07-02 | Discharge: 2011-07-02 | Disposition: A | Discharge status: Home / Self Care | Payer: Medicaid Other | Source: Ambulatory Visit | Attending: Obstetrics | Admitting: Obstetrics

## 2011-07-02 ENCOUNTER — Encounter (HOSPITAL_COMMUNITY): Payer: Self-pay

## 2011-07-02 DIAGNOSIS — O99891 Other specified diseases and conditions complicating pregnancy: Secondary | ICD-10-CM | POA: Insufficient documentation

## 2011-07-02 DIAGNOSIS — R51 Headache: Secondary | ICD-10-CM | POA: Insufficient documentation

## 2011-07-02 DIAGNOSIS — O210 Mild hyperemesis gravidarum: Secondary | ICD-10-CM

## 2011-07-02 DIAGNOSIS — N949 Unspecified condition associated with female genital organs and menstrual cycle: Secondary | ICD-10-CM | POA: Insufficient documentation

## 2011-07-02 LAB — URINALYSIS, ROUTINE W REFLEX MICROSCOPIC
Bilirubin Urine: NEGATIVE
Glucose, UA: NEGATIVE mg/dL
Hgb urine dipstick: NEGATIVE
Ketones, ur: NEGATIVE mg/dL
Protein, ur: NEGATIVE mg/dL
pH: 6.5 (ref 5.0–8.0)

## 2011-07-02 LAB — URINE MICROSCOPIC-ADD ON

## 2011-07-02 MED ORDER — CYCLOBENZAPRINE HCL 10 MG PO TABS: 10.0000 mg | ORAL_TABLET | Freq: Three times a day (TID) | ORAL | Status: AC | PRN

## 2011-07-02 MED ORDER — LACTATED RINGERS IV SOLN
Freq: Once | INTRAVENOUS | Status: AC
  Administered 2011-07-02: 04:00:00 via INTRAVENOUS

## 2011-07-02 MED ORDER — BUTALBITAL-APAP-CAFFEINE 50-325-40 MG PO TABS
2.0000 | ORAL_TABLET | Freq: Once | ORAL | Status: AC
  Administered 2011-07-02: 2 via ORAL
  Filled 2011-07-02: qty 2

## 2011-07-02 MED ORDER — PROCHLORPERAZINE EDISYLATE 5 MG/ML IJ SOLN
10.0000 mg | Freq: Once | INTRAMUSCULAR | Status: AC
  Administered 2011-07-02: 10 mg via INTRAVENOUS
  Filled 2011-07-02: qty 2

## 2011-07-02 MED ORDER — DIPHENHYDRAMINE HCL 50 MG/ML IJ SOLN
12.5000 mg | Freq: Once | INTRAMUSCULAR | Status: AC
  Administered 2011-07-02: 12.5 mg via INTRAVENOUS
  Filled 2011-07-02: qty 1

## 2011-07-02 NOTE — MAU Note (Signed)
Pt reports ongoing pelvic pain but worsening today, headache, "just like the migraine i was treated for last month".

## 2011-07-02 NOTE — MAU Provider Note (Signed)
History     CSN: [id number]  Arrival date and time: 07/02/11 0141   First Provider Initiated Contact with Patient 07/02/11 0214      Chief Complaint  Patient presents with  . Headache  . Pelvic Pain   HPI This is a 26 y.o. female at [redacted]w[redacted]d who presents with c/o headache unrelieved by Tylenol. Was treated for a similar headache a few weeks ago. No visual changes or sudden swelling. No history of hypertension. Also C/O pelvic discomfort related to low position of baby. Denies leaking or bleeding.   OB History    Grav Para Term Preterm Abortions TAB SAB Ect Mult Living   5 2 2  0 2 0 2 0 0 2      Past Medical History  Diagnosis Date  . Asthma   . Scoliosis   . Blood type, Rh negative   . Pregnant state, incidental   . Anemia   . Chronic back pain     scolosis- on percocet  . Hyperemesis complicating pregnancy, antepartum     Zofran PRN  . Headache     Past Surgical History  Procedure Date  . Cholecystectomy   . Adenoidectomy   . Dilation and curettage of uterus 2008    Family History  Problem Relation Age of Onset  . Heart disease Maternal Grandfather   . Diabetes Maternal Grandfather   . Anesthesia problems Neg Hx     History  Substance Use Topics  . Smoking status: Former Smoker -- 0.2 packs/day  . Smokeless tobacco: Never Used  . Alcohol Use: No    Allergies: No Known Allergies  Prescriptions prior to admission  Medication Sig Dispense Refill  . acetaminophen (TYLENOL) 500 MG tablet Take 500 mg by mouth every 6 (six) hours as needed. migrains      . ondansetron (ZOFRAN-ODT) 8 MG disintegrating tablet Take 8 mg by mouth every 8 (eight) hours as needed. For nausea      . oxyCODONE-acetaminophen (PERCOCET) 10-325 MG per tablet Take 1 tablet by mouth every 6 (six) hours as needed. For pain, chronic migrains      . Prenatal Vit-Fe Fumarate-FA (PRENATAL MULTIVITAMIN) TABS Take 1 tablet by mouth daily.        ROS As listed in HPI  Physical Exam   Blood  pressure 117/66, pulse 65, temperature 98.8 F (37.1 C), temperature source Oral, resp. rate 18, height 5\' 10"  (1.778 m), weight 277 lb (125.646 kg), last menstrual period 10/10/2010.  Physical Exam  Constitutional: She appears well-developed and well-nourished. No distress.  HENT:  Head: Normocephalic.  Neck: Normal range of motion. Neck supple.  Cardiovascular: Normal rate.   Respiratory: Effort normal.  GI: Soft. There is no tenderness.  Genitourinary: Vagina normal. No vaginal discharge found.       Dilation: 1 Effacement (%): 50 Cervical Position: Posterior Station: -3 Exam by:: Artelia Laroche CNM  FHR reactive with rare contractions  Musculoskeletal: Normal range of motion.  Neurological: She is alert.  Skin: Skin is warm and dry.  Psychiatric: She has a normal mood and affect.   Trace to 1+pedal edema  Results for orders placed during the hospital encounter of 07/02/11 (from the past 24 hour(s))  URINALYSIS, ROUTINE W REFLEX MICROSCOPIC     Status: Abnormal   Collection Time   07/02/11  1:30 AM      Component Value Range   Color, Urine YELLOW  YELLOW   APPearance CLEAR  CLEAR   Specific Gravity, Urine 1.015  1.005 - 1.030   pH 6.5  5.0 - 8.0   Glucose, UA NEGATIVE  NEGATIVE mg/dL   Hgb urine dipstick NEGATIVE  NEGATIVE   Bilirubin Urine NEGATIVE  NEGATIVE   Ketones, ur NEGATIVE  NEGATIVE mg/dL   Protein, ur NEGATIVE  NEGATIVE mg/dL   Urobilinogen, UA 0.2  0.0 - 1.0 mg/dL   Nitrite NEGATIVE  NEGATIVE   Leukocytes, UA MODERATE (*) NEGATIVE  URINE MICROSCOPIC-ADD ON     Status: Abnormal   Collection Time   07/02/11  1:30 AM      Component Value Range   Squamous Epithelial / LPF MANY (*) RARE   WBC, UA 7-10  <3 WBC/hpf   RBC / HPF 0-2  <3 RBC/hpf   Bacteria, UA RARE  RARE   Urine-Other MUCOUS PRESENT      Dilation: 1 Effacement (%): 50 Cervical Position: Posterior Station: -3 Exam by:: Artelia Laroche CNM  MAU Course  Procedures  MDM Tried FIoricet two tabs  without relief. WIll try the IVF/Benadry/Compazine protocol. >> feels better afterward, but still has some tension in neck.    Assessment and Plan  A:  SIUP at [redacted]w[redacted]d       Headache, probably migraine with possible tension component      No evidence of preeclampsia P:  Discharge home      Rx Flexeril for use at home      Has appt in office Thursday  [facility] 07/02/2011, 3:14 AM

## 2011-07-02 NOTE — Discharge Instructions (Signed)

## 2011-07-04 ENCOUNTER — Inpatient Hospital Stay (HOSPITAL_COMMUNITY)
Admission: AD | Admit: 2011-07-04 | Discharge: 2011-07-05 | Disposition: A | Discharge status: Home / Self Care | Payer: Medicaid Other | Source: Ambulatory Visit | Attending: Obstetrics & Gynecology | Admitting: Obstetrics & Gynecology

## 2011-07-04 ENCOUNTER — Encounter (HOSPITAL_COMMUNITY): Payer: Self-pay

## 2011-07-04 DIAGNOSIS — W010XXA Fall on same level from slipping, tripping and stumbling without subsequent striking against object, initial encounter: Secondary | ICD-10-CM | POA: Insufficient documentation

## 2011-07-04 DIAGNOSIS — R109 Unspecified abdominal pain: Secondary | ICD-10-CM

## 2011-07-04 DIAGNOSIS — R51 Headache: Secondary | ICD-10-CM | POA: Insufficient documentation

## 2011-07-04 DIAGNOSIS — R1032 Left lower quadrant pain: Secondary | ICD-10-CM | POA: Insufficient documentation

## 2011-07-04 DIAGNOSIS — O210 Mild hyperemesis gravidarum: Secondary | ICD-10-CM

## 2011-07-04 DIAGNOSIS — O99891 Other specified diseases and conditions complicating pregnancy: Secondary | ICD-10-CM | POA: Insufficient documentation

## 2011-07-04 MED ORDER — CYCLOBENZAPRINE HCL 10 MG PO TABS
10.0000 mg | ORAL_TABLET | Freq: Once | ORAL | Status: AC
  Administered 2011-07-05: 10 mg via ORAL
  Filled 2011-07-04: qty 1

## 2011-07-04 MED ORDER — OXYCODONE-ACETAMINOPHEN 5-325 MG PO TABS
2.0000 | ORAL_TABLET | Freq: Once | ORAL | Status: AC
  Administered 2011-07-05: 2 via ORAL
  Filled 2011-07-04: qty 2

## 2011-07-04 NOTE — MAU Note (Signed)
Patient is brought in by ems with c/o pelvic sharp pain that started after falling face forward into mud about an hour ago. She states that she hit her head and was told by family that she passed out. She reports feeling fetal movement. No vaginal bleeding observed. She does have a slighlty bloody lip.

## 2011-07-05 ENCOUNTER — Inpatient Hospital Stay (HOSPITAL_COMMUNITY): Payer: Medicaid Other

## 2011-07-05 LAB — COMPREHENSIVE METABOLIC PANEL
ALT: 6 U/L (ref 0–35)
AST: 14 U/L (ref 0–37)
Alkaline Phosphatase: 116 U/L (ref 39–117)
CO2: 23 mEq/L (ref 19–32)
Calcium: 8.9 mg/dL (ref 8.4–10.5)
GFR calc non Af Amer: >90 mL/min (ref >90)
Potassium: 3.7 mEq/L (ref 3.5–5.1)
Sodium: 135 mEq/L (ref 135–145)
Total Protein: 6.2 g/dL (ref 6.0–8.3)

## 2011-07-05 LAB — CBC
MCH: 25.6 pg — ABNORMAL LOW (ref 26.0–34.0)
Platelets: 212 10*3/uL (ref 150–400)
RBC: 3.67 MIL/uL — ABNORMAL LOW (ref 3.87–5.11)

## 2011-07-05 MED ORDER — HYDROMORPHONE HCL PF 1 MG/ML IJ SOLN
2.0000 mg | Freq: Once | INTRAMUSCULAR | Status: AC
  Administered 2011-07-05: 2 mg via INTRAMUSCULAR
  Filled 2011-07-05: qty 2

## 2011-07-05 NOTE — MAU Provider Note (Signed)
History     CSN: [id number]  Arrival date and time: 07/04/11 2253   First Provider Initiated Contact with Patient 07/04/11 2319      Chief Complaint  Patient presents with  . Fall  . Pelvic Pain   HPI  Pt is [redacted]w[redacted]d pregnant and presents via EMS with complaint of tripping and falling while she was walking.  She is not sure if she tripped over a branch, but pt landed in the mud hitting her abdomen and her face-her brother told her she had blacked out.  Pt has left lower abdominal pain and headache.  She denies spotting or bleeding or contractions or UTI symptoms. Pt takes Percocet for headaches at home and flexeril for back problems.  Past Medical History  Diagnosis Date  . Asthma   . Scoliosis   . Blood type, Rh negative   . Pregnant state, incidental   . Anemia   . Chronic back pain     scolosis- on percocet  . Hyperemesis complicating pregnancy, antepartum     Zofran PRN  . Headache     Past Surgical History  Procedure Date  . Cholecystectomy   . Adenoidectomy   . Dilation and curettage of uterus 2008    Family History  Problem Relation Age of Onset  . Heart disease Maternal Grandfather   . Diabetes Maternal Grandfather   . Anesthesia problems Neg Hx     History  Substance Use Topics  . Smoking status: Former Smoker -- 0.2 packs/day  . Smokeless tobacco: Never Used  . Alcohol Use: No    Allergies: No Known Allergies  Prescriptions prior to admission  Medication Sig Dispense Refill  . acetaminophen (TYLENOL) 500 MG tablet Take 500 mg by mouth every 6 (six) hours as needed. migrains      . cyclobenzaprine (FLEXERIL) 10 MG tablet Take 1 tablet (10 mg total) by mouth 3 (three) times daily as needed for muscle spasms.  30 tablet  0  . ondansetron (ZOFRAN-ODT) 8 MG disintegrating tablet Take 8 mg by mouth every 8 (eight) hours as needed. For nausea      . oxyCODONE-acetaminophen (PERCOCET) 10-325 MG per tablet Take 1 tablet by mouth every 6 (six) hours as  needed. For pain, chronic migrains      . Prenatal Vit-Fe Fumarate-FA (PRENATAL MULTIVITAMIN) TABS Take 1 tablet by mouth daily.        ROS Physical Exam   Blood pressure 108/64, pulse 106, temperature 99.1 F (37.3 C), temperature source Oral, resp. rate 20, last menstrual period 10/10/2010, SpO2 99.00%.  Physical Exam  Vitals reviewed. Constitutional: She is oriented to person, place, and time. She appears well-developed and well-nourished.  HENT:  Head: Normocephalic.       Bottom lip slightly edematous and small laceration with minimal bleeding- cold wash cloth applied- no other contusions or lacerations  Eyes: Pupils are equal, round, and reactive to light.  Cardiovascular: Normal rate.   Respiratory: Effort normal.  GI: Soft. She exhibits no distension. There is tenderness. There is no rebound and no guarding.  Musculoskeletal:       Difficulty getting in and out of bed with left hip discomfort- no bruising or contusions or lacerations noted  Neurological: She is alert and oriented to person, place, and time.  Skin: Skin is warm and dry.  Psychiatric: She has a normal mood and affect.    MAU Course  Procedures Results for orders placed during the hospital encounter of 07/04/11 (from the past  24 hour(s))  CBC     Status: Abnormal   Collection Time   07/04/11 11:53 PM      Component Value Range   WBC 9.2  4.0 - 10.5 K/uL   RBC 3.67 (*) 3.87 - 5.11 MIL/uL   Hemoglobin 9.4 (*) 12.0 - 15.0 g/dL   HCT 40.9 (*) 81.1 - 91.4 %   MCV 82.8  78.0 - 100.0 fL   MCH 25.6 (*) 26.0 - 34.0 pg   MCHC 30.9  30.0 - 36.0 g/dL   RDW 78.2  95.6 - 21.3 %   Platelets 212  150 - 400 K/uL  COMPREHENSIVE METABOLIC PANEL     Status: Abnormal   Collection Time   07/04/11 11:53 PM      Component Value Range   Sodium 135  135 - 145 mEq/L   Potassium 3.7  3.5 - 5.1 mEq/L   Chloride 103  96 - 112 mEq/L   CO2 23  19 - 32 mEq/L   Glucose, Bld 94  70 - 99 mg/dL   BUN 10  6 - 23 mg/dL   Creatinine,  Ser 0.86  0.50 - 1.10 mg/dL   Calcium 8.9  8.4 - 57.8 mg/dL   Total Protein 6.2  6.0 - 8.3 g/dL   Albumin 2.4 (*) 3.5 - 5.2 g/dL   AST 14  0 - 37 U/L   ALT 6  0 - 35 U/L   Alkaline Phosphatase 116  39 - 117 U/L   Total Bilirubin 0.2 (*) 0.3 - 1.2 mg/dL   GFR calc non Af Amer >90  >90 mL/min   GFR calc Af Amer >90  >90 mL/min  discussed with Dr. Tamela Oddi- will monitor patient and give pain management Gave pt Flexeril 10mg  and percocet 2 tablets- pt states pain is still there- she had difficulty getting out of bed to go to the bathroom due to pain- will try Dilaudid 2mg  IM since pt has high tolerance of pain and takes Percocet and flexeril at home for back issues Will do ultrasound NST reactive and no ctx noted Clinical Data: Pelvic pain, status post fall. Assess placenta.  LIMITED OBSTETRIC ULTRASOUND  Comparison: Ultrasound of pregnancy performed 02/24/2011  Number of Fetuses: 1  Heart Rate: 136 bpm  Movement: Yes  Presentation: Cephalic  Placental Location: Anterior  Previa: No  Amniotic Fluid (Subjective): Normal  AFI: 16.7 cm (5%ile 7.3 cm, 95%ile 23.9 cm)  BPD: Not measured; pregnancy dates from prior ultrasound EDC:  07/17/2011  MATERNAL FINDINGS:  Cervix: Not assessed  Uterus/Adnexae: Otherwise within normal limits.  IMPRESSION:  Single live intrauterine pregnancy noted, in cephalic presentation.  Anterior placenta appears intact; no evidence of placenta previa.  Amniotic fluid within normal limits. Per prior ultrasound, the  gestational age is currently 38 weeks 2 days, with an estimated  date of delivery of July 17, 2011.  Original Report Authenticated By: Tonia Ghent, M.D. Pt moving without difficulty after Dilaudid Ultrasound report normal  Assessment and Plan  Fall in pregnancy Recommend continue Flexeril and Percocet prn Can take warm baths  Johnice Riebe 07/05/2011, 1:49 AM

## 2011-07-17 ENCOUNTER — Other Ambulatory Visit: Payer: Self-pay | Admitting: Obstetrics

## 2011-07-18 ENCOUNTER — Encounter (HOSPITAL_COMMUNITY): Payer: Self-pay | Admitting: *Deleted

## 2011-07-18 ENCOUNTER — Telehealth (HOSPITAL_COMMUNITY): Payer: Self-pay | Admitting: *Deleted

## 2011-07-18 NOTE — Telephone Encounter (Signed)
Preadmission screen  

## 2011-07-22 ENCOUNTER — Inpatient Hospital Stay (HOSPITAL_COMMUNITY)
Admission: RE | Admit: 2011-07-22 | Discharge: 2011-07-24 | DRG: 775 | Disposition: A | Discharge status: Home / Self Care | Payer: Medicaid Other | Source: Ambulatory Visit | Attending: Obstetrics | Admitting: Obstetrics

## 2011-07-22 ENCOUNTER — Inpatient Hospital Stay (HOSPITAL_COMMUNITY): Payer: Medicaid Other | Admitting: Anesthesiology

## 2011-07-22 ENCOUNTER — Encounter (HOSPITAL_COMMUNITY): Payer: Self-pay

## 2011-07-22 ENCOUNTER — Encounter (HOSPITAL_COMMUNITY): Payer: Self-pay | Admitting: Anesthesiology

## 2011-07-22 VITALS — BP 120/82 | HR 94 | Temp 98.3°F | Resp 20 | Ht 70.0 in | Wt 275.0 lb

## 2011-07-22 DIAGNOSIS — O480 Post-term pregnancy: Principal | ICD-10-CM | POA: Diagnosis present

## 2011-07-22 DIAGNOSIS — O210 Mild hyperemesis gravidarum: Secondary | ICD-10-CM

## 2011-07-22 DIAGNOSIS — E039 Hypothyroidism, unspecified: Secondary | ICD-10-CM | POA: Diagnosis present

## 2011-07-22 DIAGNOSIS — Z22330 Carrier of Group B streptococcus: Secondary | ICD-10-CM

## 2011-07-22 DIAGNOSIS — E079 Disorder of thyroid, unspecified: Secondary | ICD-10-CM | POA: Diagnosis present

## 2011-07-22 DIAGNOSIS — O99892 Other specified diseases and conditions complicating childbirth: Secondary | ICD-10-CM | POA: Diagnosis present

## 2011-07-22 HISTORY — DX: Unspecified infectious disease: B99.9

## 2011-07-22 LAB — CBC
HCT: 28.8 % — ABNORMAL LOW (ref 36.0–46.0)
Hemoglobin: 8.9 g/dL — ABNORMAL LOW (ref 12.0–15.0)
MCV: 82.1 fL (ref 78.0–100.0)
RBC: 3.51 MIL/uL — ABNORMAL LOW (ref 3.87–5.11)
RDW: 15.8 % — ABNORMAL HIGH (ref 11.5–15.5)
WBC: 8.4 10*3/uL (ref 4.0–10.5)

## 2011-07-22 MED ORDER — LACTATED RINGERS IV SOLN
500.0000 mL | INTRAVENOUS | Status: DC | PRN
Start: 2011-07-22 — End: 2011-07-22

## 2011-07-22 MED ORDER — PENICILLIN G POTASSIUM 5000000 UNITS IJ SOLR
5.0000 10*6.[IU] | Freq: Once | INTRAVENOUS | Status: AC
  Administered 2011-07-22: 5 10*6.[IU] via INTRAVENOUS
  Filled 2011-07-22: qty 5

## 2011-07-22 MED ORDER — ONDANSETRON HCL 4 MG PO TABS: 4.0000 mg | ORAL_TABLET | ORAL | Status: DC | PRN

## 2011-07-22 MED ORDER — LACTATED RINGERS IV SOLN: 500.0000 mL | INTRAVENOUS | Status: DC | PRN

## 2011-07-22 MED ORDER — OXYCODONE-ACETAMINOPHEN 5-325 MG PO TABS
1.0000 | ORAL_TABLET | ORAL | Status: DC | PRN
  Administered 2011-07-22: 2 via ORAL
  Administered 2011-07-22 (×2): 1 via ORAL
  Filled 2011-07-22: qty 1
  Filled 2011-07-22: qty 2
  Filled 2011-07-22: qty 1

## 2011-07-22 MED ORDER — SENNOSIDES-DOCUSATE SODIUM 8.6-50 MG PO TABS
2.0000 | ORAL_TABLET | Freq: Every day | ORAL | Status: DC
  Administered 2011-07-23: 2 via ORAL

## 2011-07-22 MED ORDER — OXYTOCIN 40 UNITS IN LACTATED RINGERS INFUSION - SIMPLE MED: 1.0000 m[IU]/min | INTRAVENOUS | Status: DC

## 2011-07-22 MED ORDER — DIBUCAINE 1 % RE OINT: 1.0000 "application " | TOPICAL_OINTMENT | RECTAL | Status: DC | PRN

## 2011-07-22 MED ORDER — LANOLIN HYDROUS EX OINT: TOPICAL_OINTMENT | CUTANEOUS | Status: DC | PRN

## 2011-07-22 MED ORDER — LIDOCAINE HCL (PF) 1 % IJ SOLN
30.0000 mL | INTRAMUSCULAR | Status: DC | PRN
  Filled 2011-07-22 (×2): qty 30

## 2011-07-22 MED ORDER — LACTATED RINGERS IV SOLN
500.0000 mL | Freq: Once | INTRAVENOUS | Status: AC
  Administered 2011-07-22: 1000 mL via INTRAVENOUS

## 2011-07-22 MED ORDER — ZOLPIDEM TARTRATE 5 MG PO TABS: 5.0000 mg | ORAL_TABLET | Freq: Every evening | ORAL | Status: DC | PRN

## 2011-07-22 MED ORDER — TERBUTALINE SULFATE 1 MG/ML IJ SOLN: 0.2500 mg | Freq: Once | INTRAMUSCULAR | Status: DC | PRN

## 2011-07-22 MED ORDER — OXYTOCIN BOLUS FROM INFUSION
250.0000 mL | Freq: Once | INTRAVENOUS | Status: DC
  Filled 2011-07-22: qty 500

## 2011-07-22 MED ORDER — PHENYLEPHRINE 40 MCG/ML (10ML) SYRINGE FOR IV PUSH (FOR BLOOD PRESSURE SUPPORT)
80.0000 ug | PREFILLED_SYRINGE | INTRAVENOUS | Status: DC | PRN
  Filled 2011-07-22: qty 5

## 2011-07-22 MED ORDER — FLEET ENEMA 7-19 GM/118ML RE ENEM: 1.0000 | ENEMA | RECTAL | Status: DC | PRN

## 2011-07-22 MED ORDER — PENICILLIN G POTASSIUM 5000000 UNITS IJ SOLR
2.5000 10*6.[IU] | INTRAVENOUS | Status: DC
  Administered 2011-07-22 (×2): 2.5 10*6.[IU] via INTRAVENOUS
  Filled 2011-07-22 (×4): qty 2.5

## 2011-07-22 MED ORDER — LACTATED RINGERS IV SOLN: INTRAVENOUS | Status: DC

## 2011-07-22 MED ORDER — ACETAMINOPHEN 325 MG PO TABS: 650.0000 mg | ORAL_TABLET | ORAL | Status: DC | PRN

## 2011-07-22 MED ORDER — OXYTOCIN BOLUS FROM INFUSION: 250.0000 mL | Freq: Once | INTRAVENOUS | Status: DC

## 2011-07-22 MED ORDER — FENTANYL 2.5 MCG/ML BUPIVACAINE 1/10 % EPIDURAL INFUSION (WH - ANES)
INTRAMUSCULAR | Status: DC | PRN
  Administered 2011-07-22: 14 mL/h via EPIDURAL

## 2011-07-22 MED ORDER — TETANUS-DIPHTH-ACELL PERTUSSIS 5-2.5-18.5 LF-MCG/0.5 IM SUSP
0.5000 mL | Freq: Once | INTRAMUSCULAR | Status: AC
  Administered 2011-07-23: 0.5 mL via INTRAMUSCULAR
  Filled 2011-07-22: qty 0.5

## 2011-07-22 MED ORDER — MEDROXYPROGESTERONE ACETATE 150 MG/ML IM SUSP
150.0000 mg | INTRAMUSCULAR | Status: AC | PRN
  Administered 2011-07-24: 150 mg via INTRAMUSCULAR
  Filled 2011-07-22: qty 1

## 2011-07-22 MED ORDER — CITRIC ACID-SODIUM CITRATE 334-500 MG/5ML PO SOLN: 30.0000 mL | ORAL | Status: DC | PRN

## 2011-07-22 MED ORDER — ONDANSETRON HCL 4 MG/2ML IJ SOLN: 4.0000 mg | Freq: Four times a day (QID) | INTRAMUSCULAR | Status: DC | PRN

## 2011-07-22 MED ORDER — DIPHENHYDRAMINE HCL 50 MG/ML IJ SOLN: 12.5000 mg | INTRAMUSCULAR | Status: DC | PRN

## 2011-07-22 MED ORDER — LACTATED RINGERS IV SOLN
INTRAVENOUS | Status: DC
  Administered 2011-07-22 (×2): via INTRAVENOUS

## 2011-07-22 MED ORDER — LIDOCAINE HCL (PF) 1 % IJ SOLN: 30.0000 mL | INTRAMUSCULAR | Status: DC | PRN

## 2011-07-22 MED ORDER — OXYCODONE-ACETAMINOPHEN 5-325 MG PO TABS
1.0000 | ORAL_TABLET | ORAL | Status: DC | PRN
  Administered 2011-07-23 – 2011-07-24 (×4): 2 via ORAL
  Filled 2011-07-22 (×4): qty 2

## 2011-07-22 MED ORDER — IBUPROFEN 600 MG PO TABS: 600.0000 mg | ORAL_TABLET | Freq: Four times a day (QID) | ORAL | Status: DC | PRN

## 2011-07-22 MED ORDER — EPHEDRINE 5 MG/ML INJ
10.0000 mg | INTRAVENOUS | Status: DC | PRN
  Filled 2011-07-22: qty 4

## 2011-07-22 MED ORDER — OXYCODONE-ACETAMINOPHEN 5-325 MG PO TABS: 1.0000 | ORAL_TABLET | ORAL | Status: DC | PRN

## 2011-07-22 MED ORDER — FENTANYL 2.5 MCG/ML BUPIVACAINE 1/10 % EPIDURAL INFUSION (WH - ANES)
14.0000 mL/h | INTRAMUSCULAR | Status: DC
  Filled 2011-07-22: qty 60

## 2011-07-22 MED ORDER — OXYTOCIN 40 UNITS IN LACTATED RINGERS INFUSION - SIMPLE MED
62.5000 mL/h | Freq: Once | INTRAVENOUS | Status: AC
  Administered 2011-07-22: 62.5 mL/h via INTRAVENOUS
  Filled 2011-07-22: qty 1000

## 2011-07-22 MED ORDER — OXYTOCIN 40 UNITS IN LACTATED RINGERS INFUSION - SIMPLE MED
1.0000 m[IU]/min | INTRAVENOUS | Status: DC
  Administered 2011-07-22: 1 m[IU]/min via INTRAVENOUS
  Administered 2011-07-22: 2 m[IU]/min via INTRAVENOUS

## 2011-07-22 MED ORDER — BENZOCAINE-MENTHOL 20-0.5 % EX AERO
1.0000 "application " | INHALATION_SPRAY | CUTANEOUS | Status: DC | PRN
  Filled 2011-07-22: qty 56

## 2011-07-22 MED ORDER — LIDOCAINE HCL (PF) 1 % IJ SOLN
INTRAMUSCULAR | Status: DC | PRN
  Administered 2011-07-22 (×2): 4 mL

## 2011-07-22 MED ORDER — PRENATAL MULTIVITAMIN CH
1.0000 | ORAL_TABLET | Freq: Every day | ORAL | Status: DC
  Administered 2011-07-23 – 2011-07-24 (×2): 1 via ORAL
  Filled 2011-07-22 (×2): qty 1

## 2011-07-22 MED ORDER — OXYTOCIN 40 UNITS IN LACTATED RINGERS INFUSION - SIMPLE MED: 62.5000 mL/h | Freq: Once | INTRAVENOUS | Status: DC

## 2011-07-22 MED ORDER — DIPHENHYDRAMINE HCL 25 MG PO CAPS: 25.0000 mg | ORAL_CAPSULE | Freq: Four times a day (QID) | ORAL | Status: DC | PRN

## 2011-07-22 MED ORDER — FLEET ENEMA 7-19 GM/118ML RE ENEM
1.0000 | ENEMA | RECTAL | Status: DC | PRN
Start: 2011-07-22 — End: 2011-07-22

## 2011-07-22 MED ORDER — IBUPROFEN 600 MG PO TABS
600.0000 mg | ORAL_TABLET | Freq: Four times a day (QID) | ORAL | Status: DC
  Administered 2011-07-22 – 2011-07-24 (×7): 600 mg via ORAL
  Filled 2011-07-22 (×7): qty 1

## 2011-07-22 MED ORDER — PHENYLEPHRINE 40 MCG/ML (10ML) SYRINGE FOR IV PUSH (FOR BLOOD PRESSURE SUPPORT): 80.0000 ug | PREFILLED_SYRINGE | INTRAVENOUS | Status: DC | PRN

## 2011-07-22 MED ORDER — ONDANSETRON HCL 4 MG/2ML IJ SOLN: 4.0000 mg | INTRAMUSCULAR | Status: DC | PRN

## 2011-07-22 MED ORDER — WITCH HAZEL-GLYCERIN EX PADS: 1.0000 "application " | MEDICATED_PAD | CUTANEOUS | Status: DC | PRN

## 2011-07-22 MED ORDER — OXYTOCIN 40 UNITS IN LACTATED RINGERS INFUSION - SIMPLE MED: 62.5000 mL/h | INTRAVENOUS | Status: DC | PRN

## 2011-07-22 MED ORDER — SIMETHICONE 80 MG PO CHEW: 80.0000 mg | CHEWABLE_TABLET | ORAL | Status: DC | PRN

## 2011-07-22 NOTE — H&P (Signed)
Jacqueline Clarke is a 26 y.o. female presenting for IOL for postdates. Maternal Medical History:  Reason for admission: 26 yo G5 P2.  EDC 07-17-11.  Presents for IOL for postdates.  Fetal activity: Perceived fetal activity is normal.   Last perceived fetal movement was within the past hour.    Prenatal complications: Hyperemesis.  Prenatal Complications - Diabetes: none.    OB History    Grav Para Term Preterm Abortions TAB SAB Ect Mult Living   5 2 2  0 2 0 2 0 0 2     Past Medical History  Diagnosis Date  . Asthma   . Scoliosis   . Blood type, Rh negative   . Pregnant state, incidental   . Anemia   . Chronic back pain     scolosis- on percocet  . Hyperemesis complicating pregnancy, antepartum     Zofran PRN  . Headache   . Cholelithiasis   . Pregnancy induced hypertension   . Hypothyroidism   . Infection     hx MRSA; 3 negative tests since   Past Surgical History  Procedure Date  . Cholecystectomy   . Adenoidectomy   . Dilation and curettage of uterus 2008   Family History: family history includes Diabetes in her maternal grandfather and Heart disease in her maternal grandfather.  There is no history of Anesthesia problems. Social History:  reports that she quit smoking about 8 months ago. She has never used smokeless tobacco. She reports that she does not drink alcohol or use illicit drugs.   Prenatal Transfer Tool  Maternal Diabetes: No Genetic Screening: Normal Maternal Ultrasounds/Referrals: Normal Fetal Ultrasounds or other Referrals:  None Maternal Substance Abuse:  No Significant Maternal Medications:  Meds include: Other: see prenatal record Significant Maternal Lab Results:  Lab values include: Group B Strep positive Other Comments:  Positive H/O MRSA  Review of Systems  Neurological: Positive for headaches.  All other systems reviewed and are negative.      Blood pressure 129/89, pulse 94, temperature 98.5 F (36.9 C), temperature source  Oral, resp. rate 20, height 5\' 10"  (1.778 m), weight 124.739 kg (275 lb), last menstrual period 10/10/2010. Maternal Exam:  Uterine Assessment: Contraction strength is mild.  Contraction frequency is regular.   Abdomen: Patient reports no abdominal tenderness. Fetal presentation: vertex  Introitus: Normal vulva. Normal vagina.    Physical Exam  Nursing note and vitals reviewed. Constitutional: She is oriented to person, place, and time. She appears well-developed and well-nourished.  HENT:  Head: Normocephalic and atraumatic.  Eyes: Conjunctivae are normal. Pupils are equal, round, and reactive to light.  Neck: Normal range of motion. Neck supple.  Cardiovascular: Normal rate and regular rhythm.   Respiratory: Effort normal.  GI: Soft.  Genitourinary: Vagina normal and uterus normal.  Musculoskeletal: Normal range of motion.  Neurological: She is alert and oriented to person, place, and time.  Skin: Skin is warm and dry.  Psychiatric: She has a normal mood and affect. Her behavior is normal. Judgment and thought content normal.    Prenatal labs: ABO, Rh: --/--/O NEG (04/04 1210) Antibody: NEG (04/04 1210) Rubella: 17.8 (12/06 1700) RPR: NON REACTIVE (12/06 1700)  HBsAg: NEGATIVE (12/06 1700)  HIV:    GBS: Positive (05/27 0000)   Assessment/Plan: Postdates.  IOL.  Favorable cervix.  Will augment with pitocin, low dose protocol.   Laurie Penado A 07/22/2011, 8:38 AM

## 2011-07-22 NOTE — Anesthesia Preprocedure Evaluation (Signed)
Anesthesia Evaluation  Patient identified by MRN, date of birth, ID band Patient awake    Reviewed: Allergy & Precautions, H&P , Patient's Chart, lab work & pertinent test results  Airway Mallampati: III TM Distance: >3 FB Neck ROM: full    Dental No notable dental hx. (+) Teeth Intact   Pulmonary asthma ,  breath sounds clear to auscultation  Pulmonary exam normal       Cardiovascular hypertension, Rhythm:regular Rate:Normal  PIH   Neuro/Psych  Headaches, negative psych ROS   GI/Hepatic negative GI ROS, Neg liver ROS,   Endo/Other  Hypothyroidism Morbid obesity  Renal/GU negative Renal ROS  negative genitourinary   Musculoskeletal   Abdominal Normal abdominal exam  (+)   Peds  Hematology  (+) Blood dyscrasia, anemia ,   Anesthesia Other Findings   Reproductive/Obstetrics (+) Pregnancy                           Anesthesia Physical Anesthesia Plan  ASA: III  Anesthesia Plan: Epidural   Post-op Pain Management:    Induction:   Airway Management Planned:   Additional Equipment:   Intra-op Plan:   Post-operative Plan:   Informed Consent: I have reviewed the patients History and Physical, chart, labs and discussed the procedure including the risks, benefits and alternatives for the proposed anesthesia with the patient or authorized representative who has indicated his/her understanding and acceptance.     Plan Discussed with: Anesthesiologist and Surgeon  Anesthesia Plan Comments:         Anesthesia Quick Evaluation

## 2011-07-22 NOTE — Progress Notes (Signed)
Jacqueline Clarke is a 26 y.o. [id number] at [redacted]w[redacted]d by LMP admitted for induction of labor due to Post dates. Due date 07-17-11.  Subjective:  C/O Headache.   Objective: BP 129/89  Pulse 94  Temp 98.5 F (36.9 C) (Oral)  Resp 20  Ht 5\' 10"  (1.778 m)  Wt 124.739 kg (275 lb)  BMI 39.46 kg/m2  LMP 10/10/2010      FHT:  FHR: 150 bpm, variability: moderate,  accelerations:  Present,  decelerations:  Absent UC:   regular, every 8-10 minutes SVE:   Dilation: 2.5 Effacement (%): 80 Station: -2 Exam by:: Jacqueline Slipper, RN  Labs: Lab Results  Component Value Date   WBC 9.2 07/04/2011   HGB 9.4* 07/04/2011   HCT 30.4* 07/04/2011   MCV 82.8 07/04/2011   PLT 212 07/04/2011    Assessment / Plan: Postdates.  Favorable cervix.  Low dose pitocin started.  Labor: Progressing normally Preeclampsia:  n/a Fetal Wellbeing:  Category I Pain Control:  Labor support without medications I/D:  n/a Anticipated MOD:  NSVD  Jacqueline Clarke A 07/22/2011, 8:49 AM

## 2011-07-22 NOTE — Progress Notes (Signed)
RENEZMAE CANLAS is a 26 y.o. [id number] at [redacted]w[redacted]d by LMP admitted for induction of labor due to Post dates. Due date 07-17-11 .  Subjective:   Objective: BP 125/73  Pulse 84  Temp 98.1 F (36.7 C) (Oral)  Resp 20  Ht 5\' 10"  (1.778 m)  Wt 124.739 kg (275 lb)  BMI 39.46 kg/m2  SpO2 100%  LMP 10/10/2010      FHT:  FHR: 150 bpm, variability: moderate,  accelerations:  Present,  decelerations:  Absent UC:   regular, every 3-4 minutes SVE:   Dilation: 7.5 Effacement (%): 100 Station: -1 Exam by:: Enis Slipper, RN  Labs: Lab Results  Component Value Date   WBC 8.4 07/22/2011   HGB 8.9* 07/22/2011   HCT 28.8* 07/22/2011   MCV 82.1 07/22/2011   PLT 209 07/22/2011    Assessment / Plan: Induction of labor due to postterm,  progressing well on pitocin  Labor: Progressing normally Preeclampsia:  n/a Fetal Wellbeing:  Category I Pain Control:  Epidural I/D:  n/a Anticipated MOD:  NSVD  HARPER,CHARLES A 07/22/2011, 5:15 PM

## 2011-07-22 NOTE — Anesthesia Procedure Notes (Signed)
Epidural Patient location during procedure: OB Start time: 07/22/2011 3:10 PM  Staffing Anesthesiologist: Rett Stehlik A. Performed by: anesthesiologist   Preanesthetic Checklist Completed: patient identified, site marked, surgical consent, pre-op evaluation, timeout performed, IV checked, risks and benefits discussed and monitors and equipment checked  Epidural Patient position: sitting Prep: site prepped and draped and DuraPrep Patient monitoring: continuous pulse ox and blood pressure Approach: midline Injection technique: LOR air  Needle:  Needle type: Tuohy  Needle gauge: 17 G Needle length: 9 cm Needle insertion depth: 7 cm Catheter type: closed end flexible Catheter size: 19 Gauge Catheter at skin depth: 12 cm Test dose: negative and Other  Assessment Events: blood not aspirated, injection not painful, no injection resistance, negative IV test and no paresthesia  Additional Notes Patient identified. Risks and benefits discussed including failed block, incomplete  Pain control, post dural puncture headache, nerve damage, paralysis, blood pressure Changes, nausea, vomiting, reactions to medications-both toxic and allergic and post Partum back pain. All questions were answered. Patient expressed understanding and wished to proceed. Sterile technique was used throughout procedure. Epidural site was Dressed with sterile barrier dressing. No paresthesias, signs of intravascular injection Or signs of intrathecal spread were encountered.  Patient was more comfortable after the epidural was dosed. Please see RN's note for documentation of vital signs and FHR which are stable.

## 2011-07-22 NOTE — Progress Notes (Signed)
Jacqueline Clarke is a 26 y.o. [id number] at [redacted]w[redacted]d by LMP admitted for induction of labor due to Post dates. Due date 07-17-11.  Subjective:   Objective: BP 123/74  Pulse 94  Temp 97.9 F (36.6 C) (Oral)  Resp 20  Ht 5\' 10"  (1.778 m)  Wt 124.739 kg (275 lb)  BMI 39.46 kg/m2  LMP 10/10/2010      FHT:  FHR: 150 bpm, variability: moderate,  accelerations:  Present,  decelerations:  Absent UC:   regular SVE:   Dilation: 5 Effacement (%): 80 Station: -2 Exam by:: Enis Slipper, RN  Labs: Lab Results  Component Value Date   WBC 8.4 07/22/2011   HGB 8.9* 07/22/2011   HCT 28.8* 07/22/2011   MCV 82.1 07/22/2011   PLT 209 07/22/2011    Assessment / Plan: Induction of labor due to postterm,  progressing well on pitocin  Labor: Progressing on Pitocin, will continue to increase then AROM Preeclampsia:  n/a Fetal Wellbeing:  Category I Pain Control:  Nubain I/D:  n/a Anticipated MOD:  NSVD  HARPER,CHARLES A 07/22/2011, 2:53 PM

## 2011-07-23 LAB — CBC
MCH: 25 pg — ABNORMAL LOW (ref 26.0–34.0)
MCHC: 30.5 g/dL (ref 30.0–36.0)
MCV: 81.9 fL (ref 78.0–100.0)
Platelets: 172 10*3/uL (ref 150–400)
RDW: 15.6 % — ABNORMAL HIGH (ref 11.5–15.5)
WBC: 7.7 10*3/uL (ref 4.0–10.5)

## 2011-07-23 MED ORDER — PNEUMOCOCCAL VAC POLYVALENT 25 MCG/0.5ML IJ INJ
0.5000 mL | INJECTION | INTRAMUSCULAR | Status: DC
  Filled 2011-07-23: qty 0.5

## 2011-07-23 MED ORDER — RHO D IMMUNE GLOBULIN 1500 UNIT/2ML IJ SOLN
300.0000 ug | Freq: Once | INTRAMUSCULAR | Status: AC
  Administered 2011-07-23: 300 ug via INTRAMUSCULAR
  Filled 2011-07-23: qty 2

## 2011-07-23 NOTE — Anesthesia Postprocedure Evaluation (Signed)
  Anesthesia Post-op Note  Patient: Jacqueline Clarke  Procedure(s) Performed: * No procedures listed *  Patient Location: PACU and Mother/Baby  Anesthesia Type: Epidural  Level of Consciousness: awake  Airway and Oxygen Therapy: Patient Spontanous Breathing  Post-op Pain: none  Post-op Assessment: Patient's Cardiovascular Status Stable, Respiratory Function Stable, No signs of Nausea or vomiting, Adequate PO intake, Pain level controlled, No headache, No backache, No residual numbness and No residual motor weakness  Post-op Vital Signs: Reviewed and stable  Complications: No apparent anesthesia complications

## 2011-07-23 NOTE — Progress Notes (Signed)
Post Partum Day 1 Subjective: no complaints  Objective: Blood pressure 117/84, pulse 80, temperature 98.3 F (36.8 C), temperature source Oral, resp. rate 20, height 5\' 10"  (1.778 m), weight 124.739 kg (275 lb), last menstrual period 10/10/2010, SpO2 100.00%, unknown if currently breastfeeding.  Physical Exam:  General: alert and no distress Lochia: appropriate Uterine Fundus: firm Incision: none DVT Evaluation: No evidence of DVT seen on physical exam.   Basename 07/23/11 0520 07/22/11 0905  HGB 8.3* 8.9*  HCT 27.2* 28.8*    Assessment/Plan: Plan for discharge tomorrow   LOS: 1 day   HARPER,CHARLES A 07/23/2011, 8:38 AM

## 2011-07-23 NOTE — Progress Notes (Signed)
UR chart review completed.  

## 2011-07-24 LAB — RH IG WORKUP (INCLUDES ABO/RH): Unit division: 0

## 2011-07-24 MED ORDER — OXYCODONE-ACETAMINOPHEN 5-325 MG PO TABS: 1.0000 | ORAL_TABLET | Freq: Four times a day (QID) | ORAL | Status: AC | PRN

## 2011-07-24 MED ORDER — PRENATAL MULTIVITAMIN CH: 1.0000 | ORAL_TABLET | Freq: Every day | ORAL | Status: DC

## 2011-07-24 NOTE — Discharge Summary (Signed)
  Obstetric Discharge Summary Reason for Admission: onset of labor Prenatal Procedures: none Intrapartum Procedures: spontaneous vaginal delivery Postpartum Procedures: none Complications-Operative and Postpartum: none  Hemoglobin  Date Value Range Status  07/23/2011 8.3* 12.0 - 15.0 g/dL Final     HCT  Date Value Range Status  07/23/2011 27.2* 36.0 - 46.0 % Final    Physical Exam:  General: alert Lochia: appropriate Uterine: firm Incision: n/a DVT Evaluation: No evidence of DVT seen on physical exam.  Discharge Diagnoses: Term Pregnancy-delivered  Discharge Information: Date: 07/24/2011 Activity: pelvic rest Diet: routine Medications:  Prior to Admission medications   Medication Sig Start Date End Date Taking? Authorizing Provider  oxyCODONE-acetaminophen (PERCOCET) 5-325 MG per tablet Take 1-2 tablets by mouth every 6 (six) hours as needed (moderate - severe pain). 07/24/11 08/03/11  Antionette Char, MD  Prenatal Vit-Fe Fumarate-FA (PRENATAL MULTIVITAMIN) TABS Take 1 tablet by mouth daily. 07/24/11   Antionette Char, MD    Condition: stable Instructions: refer to routine discharge instructions Discharge to: home Follow-up Information    Follow up with Antionette Char A, MD. Schedule an appointment as soon as possible for a visit in 6 weeks.   Contact information:   [address] [place] [address] [phone]          Newborn Data: Live born  Information for the patient's newborn:  Delcia, Spitzley [[id number]]  female  Antionette Char A 07/24/2011, 8:23 AM

## 2011-07-30 ENCOUNTER — Ambulatory Visit (HOSPITAL_COMMUNITY): Admission: RE | Admit: 2011-07-30 | Payer: Medicaid Other | Source: Ambulatory Visit | Admitting: Obstetrics

## 2012-02-16 ENCOUNTER — Emergency Department (HOSPITAL_COMMUNITY): Payer: Self-pay

## 2012-02-16 ENCOUNTER — Emergency Department (HOSPITAL_COMMUNITY)
Admission: EM | Admit: 2012-02-16 | Discharge: 2012-02-16 | Disposition: A | Discharge status: Home / Self Care | Payer: Self-pay | Attending: Emergency Medicine | Admitting: Emergency Medicine

## 2012-02-16 ENCOUNTER — Encounter (HOSPITAL_COMMUNITY): Payer: Self-pay

## 2012-02-16 DIAGNOSIS — IMO0002 Reserved for concepts with insufficient information to code with codable children: Secondary | ICD-10-CM | POA: Insufficient documentation

## 2012-02-16 DIAGNOSIS — S32009A Unspecified fracture of unspecified lumbar vertebra, initial encounter for closed fracture: Secondary | ICD-10-CM | POA: Insufficient documentation

## 2012-02-16 DIAGNOSIS — Z8614 Personal history of Methicillin resistant Staphylococcus aureus infection: Secondary | ICD-10-CM | POA: Insufficient documentation

## 2012-02-16 DIAGNOSIS — Y929 Unspecified place or not applicable: Secondary | ICD-10-CM | POA: Insufficient documentation

## 2012-02-16 DIAGNOSIS — Y9389 Activity, other specified: Secondary | ICD-10-CM | POA: Insufficient documentation

## 2012-02-16 DIAGNOSIS — Z3202 Encounter for pregnancy test, result negative: Secondary | ICD-10-CM | POA: Insufficient documentation

## 2012-02-16 DIAGNOSIS — Z8719 Personal history of other diseases of the digestive system: Secondary | ICD-10-CM | POA: Insufficient documentation

## 2012-02-16 DIAGNOSIS — J45909 Unspecified asthma, uncomplicated: Secondary | ICD-10-CM | POA: Insufficient documentation

## 2012-02-16 DIAGNOSIS — Z87891 Personal history of nicotine dependence: Secondary | ICD-10-CM | POA: Insufficient documentation

## 2012-02-16 DIAGNOSIS — Z8739 Personal history of other diseases of the musculoskeletal system and connective tissue: Secondary | ICD-10-CM | POA: Insufficient documentation

## 2012-02-16 DIAGNOSIS — Z8639 Personal history of other endocrine, nutritional and metabolic disease: Secondary | ICD-10-CM | POA: Insufficient documentation

## 2012-02-16 DIAGNOSIS — G8929 Other chronic pain: Secondary | ICD-10-CM | POA: Insufficient documentation

## 2012-02-16 DIAGNOSIS — M549 Dorsalgia, unspecified: Secondary | ICD-10-CM

## 2012-02-16 DIAGNOSIS — R209 Unspecified disturbances of skin sensation: Secondary | ICD-10-CM | POA: Insufficient documentation

## 2012-02-16 DIAGNOSIS — Z862 Personal history of diseases of the blood and blood-forming organs and certain disorders involving the immune mechanism: Secondary | ICD-10-CM | POA: Insufficient documentation

## 2012-02-16 LAB — CBC WITH DIFFERENTIAL/PLATELET
Basophils Relative: 0 % (ref 0–1)
Hemoglobin: 12.5 g/dL (ref 12.0–15.0)
Lymphs Abs: 2.6 10*3/uL (ref 0.7–4.0)
Monocytes Relative: 12 % (ref 3–12)
Neutro Abs: 3.7 10*3/uL (ref 1.7–7.7)
Neutrophils Relative %: 51 % (ref 43–77)
Platelets: 312 10*3/uL (ref 150–400)
RBC: 4.86 MIL/uL (ref 3.87–5.11)
WBC: 7.2 10*3/uL (ref 4.0–10.5)

## 2012-02-16 LAB — URINALYSIS, ROUTINE W REFLEX MICROSCOPIC
Bilirubin Urine: NEGATIVE
Bilirubin Urine: NEGATIVE
Glucose, UA: NEGATIVE mg/dL
Glucose, UA: NEGATIVE mg/dL
Ketones, ur: NEGATIVE mg/dL
Protein, ur: NEGATIVE mg/dL
Specific Gravity, Urine: 1.027 (ref 1.005–1.030)
Urobilinogen, UA: 1 mg/dL (ref 0.0–1.0)
pH: 7.5 (ref 5.0–8.0)

## 2012-02-16 LAB — COMPREHENSIVE METABOLIC PANEL
ALT: 13 U/L (ref 0–35)
Albumin: 3.5 g/dL (ref 3.5–5.2)
Alkaline Phosphatase: 84 U/L (ref 39–117)
BUN: 10 mg/dL (ref 6–23)
Chloride: 102 mEq/L (ref 96–112)
Glucose, Bld: 103 mg/dL — ABNORMAL HIGH (ref 70–99)
Potassium: 4.3 mEq/L (ref 3.5–5.1)
Sodium: 137 mEq/L (ref 135–145)
Total Bilirubin: 0.2 mg/dL — ABNORMAL LOW (ref 0.3–1.2)
Total Protein: 7.8 g/dL (ref 6.0–8.3)

## 2012-02-16 LAB — URINE MICROSCOPIC-ADD ON

## 2012-02-16 MED ORDER — OXYCODONE-ACETAMINOPHEN 5-325 MG PO TABS: 1.0000 | ORAL_TABLET | Freq: Four times a day (QID) | ORAL | Status: DC | PRN

## 2012-02-16 MED ORDER — KETOROLAC TROMETHAMINE 30 MG/ML IJ SOLN
30.0000 mg | Freq: Once | INTRAMUSCULAR | Status: AC
  Administered 2012-02-16: 30 mg via INTRAVENOUS
  Filled 2012-02-16: qty 1

## 2012-02-16 MED ORDER — HYDROMORPHONE HCL PF 1 MG/ML IJ SOLN
1.0000 mg | Freq: Once | INTRAMUSCULAR | Status: AC
  Administered 2012-02-16: 1 mg via INTRAVENOUS
  Filled 2012-02-16: qty 1

## 2012-02-16 MED ORDER — LORAZEPAM 2 MG/ML IJ SOLN
0.5000 mg | Freq: Once | INTRAMUSCULAR | Status: AC
  Administered 2012-02-16: 0.5 mg via INTRAVENOUS
  Filled 2012-02-16: qty 1

## 2012-02-16 MED ORDER — IOHEXOL 300 MG/ML  SOLN
100.0000 mL | Freq: Once | INTRAMUSCULAR | Status: AC | PRN
  Administered 2012-02-16: 100 mL via INTRAVENOUS

## 2012-02-16 MED ORDER — MORPHINE SULFATE 4 MG/ML IJ SOLN
4.0000 mg | Freq: Once | INTRAMUSCULAR | Status: AC
  Administered 2012-02-16: 4 mg via INTRAVENOUS
  Filled 2012-02-16: qty 1

## 2012-02-16 MED ORDER — ONDANSETRON 4 MG PO TBDP: 4.0000 mg | ORAL_TABLET | Freq: Four times a day (QID) | ORAL | Status: DC | PRN

## 2012-02-16 MED ORDER — ONDANSETRON HCL 4 MG/2ML IJ SOLN
4.0000 mg | INTRAMUSCULAR | Status: AC
  Administered 2012-02-16: 4 mg via INTRAVENOUS
  Filled 2012-02-16: qty 2

## 2012-02-16 NOTE — ED Notes (Signed)
Patient transported to X-ray 

## 2012-02-16 NOTE — ED Provider Notes (Signed)
9:49 PM  Patient care resumed from [facility], PA-C patient presented to emergency department status post mechanical fall with left flank pain on imaging patient was seen to have left transverse process fractures of L1, L2 and L3.  Hemoglobin seen in urine.  Renal injury could not be ruled out so CT with contrast was ordered.  Plan per previous provider is disposition pending results of CT.  Imaging has been reviewed with no acute renal injury.patient is to be discharged with pain medications, PCP or orthopedic followup, and incentive spirometer. Case was discussed with attending Dr. Blinda Leatherwood who agrees w disposition.   CT ABDOMEN AND PELVIS WITH CONTRAST  Technique: Multidetector CT imaging of the abdomen and pelvis was performed following the standard protocol during bolus administration of intravenous contrast.  Contrast: OMNIPAQUE IOHEXOL 300 MG/ML SOLN  Comparison: 02/16/2012 lumbar radiographs  Findings: The liver, spleen, adrenal glands, pancreas, and kidneys are unremarkable except for a nonobstructing 3 mm right lower pole renal calculus.  No free fluid, enlarged lymph nodes, biliary dilation or abdominal aortic aneurysm identified.  The bowel, appendix and bladder are unremarkable. The uterus and adnexal regions are within normal limits. The patient is status post cholecystectomy.  There is no evidence of pneumoperitoneum or bowel wall thickening.  Minimally displaced fractures of the left transverse processes of L1, L2 and L3 are noted.  IMPRESSION: No evidence of acute intra-abdominal or intrapelvic injury/abnormality.  Minimally displaced left transverse process fractures of L1, L2 and L3.  At this time there does not appear to be any evidence of an acute emergency medical condition and the patient appears stable for discharge with appropriate outpatient follow up.Diagnosis was discussed with patient who verbalizes understanding and is agreeable to  discharge.     Jaci Carrel, [place] 02/16/12 2249

## 2012-02-16 NOTE — ED Provider Notes (Signed)
History     CSN: [id number]  Arrival date & time 02/16/12  1559   First MD Initiated Contact with Patient 02/16/12 1608      Chief Complaint  Patient presents with  . Flank Pain    (Consider location/radiation/quality/duration/timing/severity/associated sxs/prior treatment) HPI Comments: Patient is 27 y/o with extensive PMH including scoliosis and chronic back pain presents c/o constant, sharp pain in her L flank radiating to her LLQ/groin area after falling backwards into the corner of a dresser while playing with her son. Patient states she fell backwards into dresser, and then on her bottom, 1 hr before being brought to the ED by EMS. Pain is made worse with movement and deep inspiration and made better when sitting still. Patient denies LOC at time of fall or injuring any other part of her body. Patient admits to an associated tingling sensation from her L groin region down the medial part of her thigh to her knee.   Patient is a 27 y.o. female presenting with back pain. The history is provided by the patient. No language interpreter was used.  Back Pain  This is a new problem. The current episode started 1 to 2 hours ago. The problem occurs constantly. The problem has not changed since onset.The pain is associated with falling. The pain is present in the lumbar spine (L flank and lumbar region). The quality of the pain is described as burning (sharp). The pain radiates to the left thigh. The pain is at a severity of 10/10. The pain is moderate. The symptoms are aggravated by twisting and bending. The pain is the same all the time. Pertinent negatives include no chest pain, no fever and no dysuria. Risk factors include obesity (hx of chronic back pain and scoliosis).    Past Medical History  Diagnosis Date  . Asthma   . Scoliosis   . Blood type, Rh negative   . Pregnant state, incidental   . Anemia   . Chronic back pain     scolosis- on percocet  . Hyperemesis complicating  pregnancy, antepartum     Zofran PRN  . Headache   . Cholelithiasis   . Pregnancy induced hypertension   . Hypothyroidism   . Infection     hx MRSA; 3 negative tests since    Past Surgical History  Procedure Date  . Cholecystectomy   . Adenoidectomy   . Dilation and curettage of uterus 2008    Family History  Problem Relation Age of Onset  . Heart disease Maternal Grandfather   . Diabetes Maternal Grandfather   . Anesthesia problems Neg Hx     History  Substance Use Topics  . Smoking status: Former Smoker -- 0.2 packs/day    Quit date: 11/17/2010  . Smokeless tobacco: Never Used  . Alcohol Use: No    OB History    Grav Para Term Preterm Abortions TAB SAB Ect Mult Living   5 3 3  0 2 0 2 0 0 3      Review of Systems  Constitutional: Negative for fever.  Eyes: Negative for visual disturbance.  Respiratory: Negative for shortness of breath.   Cardiovascular: Negative for chest pain.  Gastrointestinal: Negative for nausea, vomiting and diarrhea.  Genitourinary: Positive for flank pain. Negative for dysuria and hematuria.  Musculoskeletal: Positive for myalgias and back pain.  Skin: Negative for color change and wound.  Neurological: Negative for dizziness, syncope and light-headedness.  Hematological: Does not bruise/bleed easily.    Allergies  Review  of patient's allergies indicates no known allergies.  Home Medications  No current outpatient prescriptions on file.  BP 96/69  Pulse 88  Temp 99 F (37.2 C) (Oral)  Resp 16  SpO2 100%  LMP 01/14/2012  Breastfeeding? No  Physical Exam  Constitutional: She appears well-developed. She is uncooperative.       Obese female who is very difficult to examine 2/2 an unwillingness to comply with basic commands; states it is "too painful" to move.  HENT:  Head: Normocephalic and atraumatic.  Eyes: Pupils are equal, round, and reactive to light.  Cardiovascular: Normal rate and regular rhythm.   Pulmonary/Chest:  Effort normal and breath sounds normal. No respiratory distress. She has no wheezes.  Abdominal: Soft. There is no guarding.  Musculoskeletal: She exhibits tenderness.       Lumbar back: She exhibits tenderness and pain. She exhibits no swelling, no deformity and no laceration.       Very difficult to examine 2/2 an unwillingness to comply with basic commands; states it is "too painful" to move. Pain is mostly confined to lumbar and L flank  Neurological: She is alert. She has normal strength. She is not disoriented. No cranial nerve deficit or sensory deficit.  Skin:       No ecchymosis, erythma, or abrasion on any part of upper and lower back  Psychiatric: She has a normal mood and affect.       Patient is anxious    ED Course  Procedures (including critical care time)  Labs Reviewed  CBC WITH DIFFERENTIAL - Abnormal; Notable for the following:    MCH 25.7 (*)     All other components within normal limits  COMPREHENSIVE METABOLIC PANEL - Abnormal; Notable for the following:    Glucose, Bld 103 (*)     Total Bilirubin 0.2 (*)     All other components within normal limits  URINALYSIS, ROUTINE W REFLEX MICROSCOPIC - Abnormal; Notable for the following:    APPearance CLOUDY (*)     Hgb urine dipstick MODERATE (*)     Leukocytes, UA MODERATE (*)     All other components within normal limits  URINE MICROSCOPIC-ADD ON - Abnormal; Notable for the following:    Squamous Epithelial / LPF FEW (*)     Bacteria, UA FEW (*)     All other components within normal limits  URINALYSIS, ROUTINE W REFLEX MICROSCOPIC - Abnormal; Notable for the following:    Hgb urine dipstick SMALL (*)     Leukocytes, UA SMALL (*)     All other components within normal limits  URINE MICROSCOPIC-ADD ON - Abnormal; Notable for the following:    Squamous Epithelial / LPF MANY (*)     All other components within normal limits  POCT PREGNANCY, URINE  URINE CULTURE   Dg Chest 2 View  02/16/2012  *RADIOLOGY REPORT*   Clinical Data: 27 year old female with left chest pain following fall.  CHEST - 2 VIEW  Comparison: None  Findings: The upper limits normal heart size noted. The lungs are clear. There is no evidence of focal airspace disease, pulmonary edema, suspicious pulmonary nodule/mass, pleural effusion, or pneumothorax. No acute bony abnormalities are identified.  IMPRESSION: No evidence of acute cardiopulmonary disease.  Upper limits normal heart size which may be technical.   Original Report Authenticated By: Harmon Pier, M.D.    Dg Lumbar Spine Complete  02/16/2012  *RADIOLOGY REPORT*  Clinical Data: Fall with left lower back pain.  LUMBAR SPINE -  COMPLETE 4+ VIEW  Comparison: 09/17/2011 lumbar spine MRI  Findings: Five non-rib bearing lumbar type vertebra are identified. A possible nondisplaced fracture of the left L1 transverse process is noted. No other fracture or subluxation identified. The disc spaces are maintained. No focal bony lesions or spondylolysis noted. A 3 mm right renal calculus and cholecystectomy clips are present.  IMPRESSION: Question nondisplaced left L1 transverse process fracture.  No other acute abnormalities identified.  Right renal calculus.   Original Report Authenticated By: Harmon Pier, M.D.    Ct Lumbar Spine Wo Contrast  02/16/2012  *RADIOLOGY REPORT*  Clinical Data: Injury, left flank/back pain  CT LUMBAR SPINE WITHOUT CONTRAST  Technique:  Multidetector CT imaging of the lumbar spine was performed without intravenous contrast administration. Multiplanar CT image reconstructions were also generated.  Comparison: Lumbar spine radiographs dated 02/16/2012  Findings: Nondisplaced left L1-L3 transverse process fractures.  Normal lumbar lordosis.  Vertebral body heights and intervertebral disc spaces are maintained.  Cholecystectomy clips.  3 mm nonobstructing right lower pole renal calculus (series 2/image 35).  No hydronephrosis.  IMPRESSION: Nondisplaced left L1-L3 transverse process  fractures.   Original Report Authenticated By: Charline Bills, M.D.      1. Back pain       MDM  Patient is 27 y/o female with PMH of scoliosis and chronic back pain who presents with L flank and lumbar pain/tenderness after falling backwards into the corner of a dresser while playing with her son. There is no ecchymosis, erythema, or abrasion to the area on PE. Patient is very emotional and difficult to examine as she claims it is too painful to move. Patient first given 4mg  morphine IV which she states did not help the pain significantly. Patient then given 1mg  IV dilaudid. CMP and CBC ordered for flank pain prior to history being taken. Patient has no symptoms of kidney stones or UTI as she denies hematuria, dysuria, frequency, and urgency; given her recent trauma to the area which led to her symptoms these diagnoses are very unlikely. Have ordered UA, however, to see if any microscopic blood in urine as trauma to the area could have caused minor injury to her kidneys. CXR and X-ray of lumbar spine also ordered to assess for fracture.  Patient's nurse states patient is having no relief with 4mg  IV morphine and 1mg  IV dilaudid. Patient states to nurse that pain level remains 10/10. Have ordered for 30mg  IV toradol and 0.5mg  IV ativan. Will reassess pain scale in an hour or so. Still awaiting imaging studies. To be noted: when listening to lung fields patient whimpers with inspiration "because of pain", but does not complain of any or increased pain when pressure is applied with stethoscope when listening to upper and lower lung fields.  CXR unremarkable for acute bony abnormalities. X-ray of lumbar spine shows evidence of questionable nondisplaced left L1 transverse process fracture. After consulting with Dr. Blinda Leatherwood, will obtain CT of lumbar spine s contrast for further evaluation.  Patient's urine positive for moderate Hgb and moderate leukocytes. Patient states LMP was 01/16/12 and that she is  due for her period. Will get in and out cath to try and determine if blood in urine is 2/2 menses or kidney trauma.   Patient states pain is an 8/10 when she is not moving in her bed, but otherwise remains at a 9/10. Have ordered more pain medication IV to try and achieve better pain control. With respect to work up, CT lumbar spine s contrast shows  nondisplaced left L1-L3 transvere process fractures. Regarding potential kidney injury, in and out cath + for many epithelial cells as well as small number of Hgb and small number of leukocytes. This is trending down from original urinalysis. Jaynie Crumble, PA-C consulted with radiologist, Charline Bills, MD, who read CT of lumbar spine; he stated that the patient's pain is consistent with the fractures he described. He does not see any evidence of kidney injury, but states that he does not have a full view of the L kidney and CT was s contrast. Have consulted with Dr. Blinda Leatherwood who thinks patient should be scanned to definitively rule out injury to the L kidney.  Have signed out patient to [facility], PA-C at shift change.  Filed Vitals:   02/16/12 1614  BP: 96/69  Pulse: 88  Temp: 99 F (37.2 C)  TempSrc: Oral  Resp: 16  SpO2: 100%       Antony Madura, PA-C 02/16/12 2109

## 2012-02-16 NOTE — ED Notes (Signed)
Patient c/o unusually severe pain after she bumped into the edge of a dresser while playing around with her kids.  No visible bruising or redness to the area.  Pt describes the pain as burning in the left flank area radiating to left groin.

## 2012-02-16 NOTE — ED Notes (Signed)
Per EMS- Patient c/o left flank pain x 30 minutes. No injury or c/o dysuria.

## 2012-02-17 NOTE — ED Provider Notes (Signed)
Medical screening examination/treatment/procedure(s) were performed by non-physician practitioner and as supervising physician I was immediately available for consultation/collaboration.  Desmon Hitchner J. Leevon Upperman, MD 02/17/12 0908 

## 2012-02-18 LAB — URINE CULTURE

## 2012-02-21 NOTE — ED Provider Notes (Signed)
Medical screening examination/treatment/procedure(s) were performed by non-physician practitioner and as supervising physician I was immediately available for consultation/collaboration.   Christopher J. Pollina, MD 02/21/12 1523 

## 2012-03-16 ENCOUNTER — Emergency Department (HOSPITAL_COMMUNITY)
Admission: EM | Admit: 2012-03-16 | Discharge: 2012-03-17 | Disposition: A | Discharge status: Home / Self Care | Payer: Medicaid Other | Attending: Emergency Medicine | Admitting: Emergency Medicine

## 2012-03-16 ENCOUNTER — Encounter (HOSPITAL_COMMUNITY): Payer: Self-pay | Admitting: *Deleted

## 2012-03-16 DIAGNOSIS — Z87891 Personal history of nicotine dependence: Secondary | ICD-10-CM | POA: Insufficient documentation

## 2012-03-16 DIAGNOSIS — Z8614 Personal history of Methicillin resistant Staphylococcus aureus infection: Secondary | ICD-10-CM | POA: Insufficient documentation

## 2012-03-16 DIAGNOSIS — J45909 Unspecified asthma, uncomplicated: Secondary | ICD-10-CM | POA: Insufficient documentation

## 2012-03-16 DIAGNOSIS — J069 Acute upper respiratory infection, unspecified: Secondary | ICD-10-CM | POA: Insufficient documentation

## 2012-03-16 DIAGNOSIS — G8929 Other chronic pain: Secondary | ICD-10-CM | POA: Insufficient documentation

## 2012-03-16 DIAGNOSIS — M549 Dorsalgia, unspecified: Secondary | ICD-10-CM | POA: Insufficient documentation

## 2012-03-16 DIAGNOSIS — Z8739 Personal history of other diseases of the musculoskeletal system and connective tissue: Secondary | ICD-10-CM | POA: Insufficient documentation

## 2012-03-16 DIAGNOSIS — Z8639 Personal history of other endocrine, nutritional and metabolic disease: Secondary | ICD-10-CM | POA: Insufficient documentation

## 2012-03-16 DIAGNOSIS — Z862 Personal history of diseases of the blood and blood-forming organs and certain disorders involving the immune mechanism: Secondary | ICD-10-CM | POA: Insufficient documentation

## 2012-03-16 DIAGNOSIS — M79609 Pain in unspecified limb: Secondary | ICD-10-CM | POA: Insufficient documentation

## 2012-03-16 DIAGNOSIS — Z8742 Personal history of other diseases of the female genital tract: Secondary | ICD-10-CM | POA: Insufficient documentation

## 2012-03-16 DIAGNOSIS — Z8719 Personal history of other diseases of the digestive system: Secondary | ICD-10-CM | POA: Insufficient documentation

## 2012-03-16 NOTE — ED Notes (Signed)
[id number]<AV> Expected date:<BR> Expected time:<BR> Means of arrival:<BR> Comments:<BR> Waiting room

## 2012-03-16 NOTE — ED Notes (Signed)
Pt states she was seen here last week when she fell and was diagnosed with '3 broke Ligaments in her back"  And she took her last percocet this morning and now taking motrin and tylenol for pain,  Also sinus congestion and cold like symptoms,

## 2012-03-17 ENCOUNTER — Emergency Department (HOSPITAL_COMMUNITY): Payer: Medicaid Other

## 2012-03-17 MED ORDER — NAPROXEN 500 MG PO TABS: 500.0000 mg | ORAL_TABLET | Freq: Two times a day (BID) | ORAL | Status: DC

## 2012-03-17 MED ORDER — OXYCODONE-ACETAMINOPHEN 5-325 MG PO TABS
2.0000 | ORAL_TABLET | Freq: Once | ORAL | Status: AC
  Administered 2012-03-17: 2 via ORAL
  Filled 2012-03-17: qty 2

## 2012-03-17 NOTE — ED Provider Notes (Signed)
History     CSN: [id number]  Arrival date & time 03/16/12  2324   None     Chief Complaint  Patient presents with  . Arm Pain  . Back Pain  . URI    (Consider location/radiation/quality/duration/timing/severity/associated sxs/prior treatment) HPI Comments: Jacqueline Clarke is a 27 y.o. female with a history of chronic back pain presents to the emergency department requesting medication refill on narcotic prescription.  Patient was evaluated one month ago and diagnosed with L1-L3 transverse fractures.  Patient at that time was given a spirometer and a prescription for 30 Percocet as well as recommendation to followup with [facility] Dr. Clelia Croft.  Patient states that she has not followed up with orthopedics and she finished her last Percocet today, however she is still experiencing pain.  She also states that she's had more frequent cough and upper respiratory type symptoms.  She denies any fevers, night sweats, chills, chest pain, leg swelling, hemoptysis, palpitations or shortness of breath.  Patient denies any new trauma, injury, numbness or weakness. No other complaints at this time.  The history is provided by the patient.    Past Medical History  Diagnosis Date  . Asthma   . Scoliosis   . Blood type, Rh negative   . Pregnant state, incidental   . Anemia   . Chronic back pain     scolosis- on percocet  . Hyperemesis complicating pregnancy, antepartum     Zofran PRN  . Headache   . Cholelithiasis   . Pregnancy induced hypertension   . Hypothyroidism   . 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 Cholecystectomy    . Adenoidectomy    . Dilation and curettage of uterus  2008    Family History  Problem Relation Age of Onset  . Heart disease Maternal Grandfather   . Diabetes Maternal Grandfather   . Anesthesia problems Neg Hx     History  Substance Use Topics  . Smoking status: Former Smoker  -- 0.25 packs/day    Quit date: 11/17/2010  . Smokeless tobacco: Never Used  . Alcohol Use: No    OB History   Grav Para Term Preterm Abortions TAB SAB Ect Mult Living   5 3 3  0 2 0 2 0 0 3      Review of Systems  All other systems reviewed and are negative.    Allergies  Review of patient's allergies indicates no known allergies.  Home Medications   Current Outpatient Rx  Name  Route  Sig  Dispense  Refill  . ibuprofen (ADVIL,MOTRIN) 600 MG tablet   Oral   Take 1,200 mg by mouth every 6 (six) hours as needed for pain.         Marland Kitchen oxyCODONE-acetaminophen (PERCOCET/ROXICET) 5-325 MG per tablet   Oral   Take 1 tablet by mouth every 6 (six) hours as needed for pain.   30 tablet   0     BP 116/62  Pulse 97  Temp(Src) 98.9 F (37.2 C) (Oral)  Resp 22  SpO2 100%  LMP 03/09/2012  Physical Exam  Nursing note and vitals reviewed. Constitutional: She is oriented to person, place, and time. She appears well-developed and well-nourished. No distress.  HENT:  Head: Normocephalic and atraumatic.  Eyes: Conjunctivae and EOM are normal.  Neck: Normal range of motion.  Cardiovascular:  Regular rate and rhythm . No swelling or  edema  Pulmonary/Chest: Effort normal.  Musculoskeletal: Normal range of motion.  Full normal range of motion of all extremities  Neurological: She is alert and oriented to person, place, and time.  Strength 5/5 bilaterally.  Intact distal sensation  Skin: Skin is warm and dry. No rash noted. She is not diaphoretic.  Psychiatric: She has a normal mood and affect. Her behavior is normal.    ED Course  Procedures (including critical care time)  Labs Reviewed - No data to display Dg Chest 2 View  03/17/2012  *RADIOLOGY REPORT*  Clinical Data: Upper chest pain radiating to the back for 2 weeks. Upper respiratory tract infection.  CHEST - 2 VIEW  Comparison: 02/16/2012  Findings: The heart size and pulmonary vascularity are normal. The lungs appear  clear and expanded without focal air space disease or consolidation. No blunting of the costophrenic angles.  No pneumothorax.  Mediastinal contours appear intact.  No significant change since previous study.  Surgical clips in the right upper quadrant.  IMPRESSION: No evidence of active pulmonary disease.   Original Report Authenticated By: Burman Nieves, M.D.      No diagnosis found.    MDM  Narcotic refill requests  Patient is a 27 year old with a history of chronic back pain who presents for medication refill of her Percocet.  Patient was diagnosed one month ago with transverse fractures at which time she was treated with 30 Percocet, incentive spirometer, and followup with [facility].  Chest x-ray was ordered to rule out pneumonia and in reviewed were no acute abnormalities found.  Vital signs are normal and no focal neuro deficits on exam. Patient states that she did not followup with orthopedics and has not had other prescriptions, however when looked in drug database she was seen by Dr. Clelia Croft and prescribed hydrocodone 10s- 30 pills on February 4 and an additional 30 pills on February 14.  It is felt at this time the patient should be weaned off narcotics and control pain with over-the-counter medications.  We'll give prescription for naproxen but further narcotics for this injury are not recommended or indicated.        Jacqueline Clarke, [place] 03/17/12 [phone]

## 2012-03-17 NOTE — ED Provider Notes (Signed)
Medical screening examination/treatment/procedure(s) were performed by non-physician practitioner and as supervising physician I was immediately available for consultation/collaboration.  Sunnie Nielsen, MD 03/17/12 [phone]

## 2012-05-17 ENCOUNTER — Encounter (HOSPITAL_COMMUNITY): Payer: Self-pay | Admitting: *Deleted

## 2012-05-17 ENCOUNTER — Emergency Department (HOSPITAL_COMMUNITY): Payer: Self-pay

## 2012-05-17 DIAGNOSIS — R079 Chest pain, unspecified: Secondary | ICD-10-CM | POA: Insufficient documentation

## 2012-05-17 DIAGNOSIS — Z862 Personal history of diseases of the blood and blood-forming organs and certain disorders involving the immune mechanism: Secondary | ICD-10-CM | POA: Insufficient documentation

## 2012-05-17 DIAGNOSIS — Z8739 Personal history of other diseases of the musculoskeletal system and connective tissue: Secondary | ICD-10-CM | POA: Insufficient documentation

## 2012-05-17 DIAGNOSIS — G8929 Other chronic pain: Secondary | ICD-10-CM | POA: Insufficient documentation

## 2012-05-17 DIAGNOSIS — F17200 Nicotine dependence, unspecified, uncomplicated: Secondary | ICD-10-CM | POA: Insufficient documentation

## 2012-05-17 DIAGNOSIS — M549 Dorsalgia, unspecified: Secondary | ICD-10-CM | POA: Insufficient documentation

## 2012-05-17 DIAGNOSIS — Z8719 Personal history of other diseases of the digestive system: Secondary | ICD-10-CM | POA: Insufficient documentation

## 2012-05-17 DIAGNOSIS — N390 Urinary tract infection, site not specified: Secondary | ICD-10-CM | POA: Insufficient documentation

## 2012-05-17 DIAGNOSIS — J45909 Unspecified asthma, uncomplicated: Secondary | ICD-10-CM | POA: Insufficient documentation

## 2012-05-17 LAB — CBC
MCV: 77.8 fL — ABNORMAL LOW (ref 78.0–100.0)
Platelets: 277 10*3/uL (ref 150–400)
RBC: 4.41 MIL/uL (ref 3.87–5.11)
RDW: 15.2 % (ref 11.5–15.5)
WBC: 9.3 10*3/uL (ref 4.0–10.5)

## 2012-05-17 LAB — BASIC METABOLIC PANEL
CO2: 27 mEq/L (ref 19–32)
Chloride: 102 mEq/L (ref 96–112)
GFR calc Af Amer: >90 mL/min (ref >90)
Potassium: 3.2 mEq/L — ABNORMAL LOW (ref 3.5–5.1)
Sodium: 139 mEq/L (ref 135–145)

## 2012-05-17 NOTE — ED Notes (Signed)
Pt c/o bilateral flank pain x 2 weeks.  Describes the pain as burning.  Also c/o chest tightness and SOB x 5 days.  No n/v/d, fevers/chills

## 2012-05-18 ENCOUNTER — Emergency Department (HOSPITAL_COMMUNITY)
Admission: EM | Admit: 2012-05-18 | Discharge: 2012-05-18 | Disposition: A | Discharge status: Home / Self Care | Payer: Self-pay | Attending: Emergency Medicine | Admitting: Emergency Medicine

## 2012-05-18 DIAGNOSIS — N390 Urinary tract infection, site not specified: Secondary | ICD-10-CM

## 2012-05-18 DIAGNOSIS — M549 Dorsalgia, unspecified: Secondary | ICD-10-CM

## 2012-05-18 LAB — URINE MICROSCOPIC-ADD ON

## 2012-05-18 LAB — URINALYSIS, ROUTINE W REFLEX MICROSCOPIC
Glucose, UA: NEGATIVE mg/dL
Ketones, ur: NEGATIVE mg/dL
Nitrite: NEGATIVE
Specific Gravity, Urine: 1.033 — ABNORMAL HIGH (ref 1.005–1.030)
pH: 5.5 (ref 5.0–8.0)

## 2012-05-18 LAB — POCT PREGNANCY, URINE: Preg Test, Ur: NEGATIVE

## 2012-05-18 LAB — TROPONIN I: Troponin I: <0.3 ng/mL (ref <0.30)

## 2012-05-18 MED ORDER — POTASSIUM CHLORIDE CRYS ER 20 MEQ PO TBCR
40.0000 meq | EXTENDED_RELEASE_TABLET | Freq: Once | ORAL | Status: AC
  Administered 2012-05-18: 40 meq via ORAL
  Filled 2012-05-18: qty 2

## 2012-05-18 MED ORDER — OXYCODONE-ACETAMINOPHEN 5-325 MG PO TABS: 1.0000 | ORAL_TABLET | ORAL | Status: DC | PRN

## 2012-05-18 MED ORDER — CEPHALEXIN 500 MG PO CAPS: 500.0000 mg | ORAL_CAPSULE | Freq: Four times a day (QID) | ORAL | Status: DC

## 2012-05-18 MED ORDER — OXYCODONE-ACETAMINOPHEN 5-325 MG PO TABS: 2.0000 | ORAL_TABLET | ORAL | Status: DC | PRN

## 2012-05-18 MED ORDER — CEPHALEXIN 250 MG PO CAPS
500.0000 mg | ORAL_CAPSULE | Freq: Once | ORAL | Status: AC
  Administered 2012-05-18: 500 mg via ORAL
  Filled 2012-05-18: qty 2

## 2012-05-18 NOTE — ED Provider Notes (Signed)
History     CSN: [id number]  Arrival date & time 05/17/12  2125   First MD Initiated Contact with Patient 05/18/12 0201      Chief Complaint  Patient presents with  . Chest Pain  . Flank Pain    (Consider location/radiation/quality/duration/timing/severity/associated sxs/prior treatment) HPI  Patient is a 27 yo woman who presents with complaints of burning dysuria and suprapubic pain x 2 days. No fever. Pain mild, nonradiating. No unusual vaginal discharge. No N/V/D. Patient notes that she has bilateral low back pain but, this has been a problem since she "fractured four ligaments" in an accident which occurred about 4 months ago.   Patient told triage nurse that she was having CP. She denies CP at this time. Says she felt like maybe her asthma was coming back. But, she has not experienced any wheezing. No cough or SOB.   Past Medical History  Diagnosis Date  . Asthma   . Scoliosis   . Blood type, Rh negative   . Pregnant state, incidental   . Anemia   . Chronic back pain     scolosis- on percocet  . Hyperemesis complicating pregnancy, antepartum     Zofran PRN  . Headache   . Cholelithiasis   . Pregnancy induced hypertension   . Hypothyroidism   . 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 Cholecystectomy    . Adenoidectomy    . Dilation and curettage of uterus  2008    Family History  Problem Relation Age of Onset  . Heart disease Maternal Grandfather   . Diabetes Maternal Grandfather   . Anesthesia problems Neg Hx     History  Substance Use Topics  . Smoking status: Current Every Day Smoker -- 0.50 packs/day    Types: Cigarettes    Last Attempt to Quit: 11/17/2010  . Smokeless tobacco: Never Used  . Alcohol Use: No    OB History   Grav Para Term Preterm Abortions TAB SAB Ect Mult Living   5 3 3  0 2 0 2 0 0 3      Review of Systems Gen: As per history of present illness, otherwise  negative Eyes: no discharge or drainage, no occular pain or visual changes Nose: no epistaxis or rhinorrhea Mouth: no dental pain, no sore throat Neck: no neck pain Lungs: no SOB, cough, wheezing CV: no chest pain, palpitations, dependent edema or orthopnea Abd: no abdominal pain, nausea, vomiting GU: no dysuria or gross hematuria MSK: no myalgias or arthralgias Neuro: no headache, no focal neurologic deficits Skin: no rash Psyche: negative.  Allergies  Review of patient's allergies indicates no known allergies.  Home Medications   Current Outpatient Rx  Name  Route  Sig  Dispense  Refill  . Multiple Vitamin (MULTIVITAMIN WITH MINERALS) TABS   Oral   Take 1 tablet by mouth daily.           BP 112/63  Pulse 82  Temp(Src) 98.6 F (37 C) (Oral)  Resp 18  SpO2 100%  LMP 04/26/2012  Physical Exam Gen: well developed and well nourished appearing Head: NCAT Eyes: PERL, EOMI Nose: no epistaixis or rhinorrhea Mouth/throat: mucosa is moist and pink Neck: supple, no stridor Lungs: CTA B, no wheezing, rhonchi or rales CV: RRR, no murmur, extremities appear well perfused Abd: soft, notender, nondistended Back: no ttp, no cva ttp Skin: no rashese, wnl Neuro: CN ii-xii  grossly intact, no focal deficits Psyche; normal affect,  calm and cooperative.   ED Course  Procedures (including critical care time)  Labs Reviewed  CBC - Abnormal; Notable for the following:    Hemoglobin 11.5 (*)    HCT 34.3 (*)    MCV 77.8 (*)    All other components within normal limits  BASIC METABOLIC PANEL - Abnormal; Notable for the following:    Potassium 3.2 (*)    All other components within normal limits  URINALYSIS, ROUTINE W REFLEX MICROSCOPIC - Abnormal; Notable for the following:    Color, Urine AMBER (*)    APPearance CLOUDY (*)    Specific Gravity, Urine 1.033 (*)    Bilirubin Urine SMALL (*)    Leukocytes, UA MODERATE (*)    All other components within normal limits  URINE  MICROSCOPIC-ADD ON - Abnormal; Notable for the following:    Squamous Epithelial / LPF MANY (*)    Bacteria, UA MANY (*)    All other components within normal limits  URINE CULTURE  TROPONIN I  POCT PREGNANCY, URINE   Dg Chest 2 View  05/17/2012  *RADIOLOGY REPORT*  Clinical Data: As of breath and cough for 5 days.  History of asthma.  Chest pain and flank pain.  CHEST - 2 VIEW  Comparison: 03/17/2012  Findings: The heart size and pulmonary vascularity are normal. The lungs appear clear and expanded without focal air space disease or consolidation. No blunting of the costophrenic angles.  No pneumothorax.  Mediastinal contours appear intact.  Surgical clips in the right upper quadrant.  No significant change since previous study.  IMPRESSION: No evidence of active pulmonary disease.   Original Report Authenticated By: Burman Nieves, M.D.       MDM  Patient with UTI and excacerbation of back pain which has been chronic since trauma 4 months ago. We are treating empirically with keflex. Urine for culture. The patient is well hydrated and nontoxic appearing. She is stable for discharge with plan for outpatient f/u and counsel to return to the ED for red flag sx. Results of ED studies, diagnosis and plan reviewed with the patient.         Brandt Loosen, MD 05/19/12 [phone]

## 2012-05-18 NOTE — ED Notes (Signed)
NURSE FIRST ROUNDS : PT. SITTING AT WAITING AREA WATCHING TV WITH NO DISTRESS, RESPIRATIONS UNLABORED , DENIES PAIN, NURSE EXPLAINED DELAY AND PROCESS.

## 2012-05-19 LAB — URINE CULTURE: Colony Count: 40000

## 2012-07-10 ENCOUNTER — Encounter (HOSPITAL_COMMUNITY): Payer: Self-pay | Admitting: *Deleted

## 2012-07-10 ENCOUNTER — Emergency Department (HOSPITAL_COMMUNITY)
Admission: EM | Admit: 2012-07-10 | Discharge: 2012-07-10 | Disposition: A | Discharge status: Home / Self Care | Payer: Medicaid Other | Attending: Emergency Medicine | Admitting: Emergency Medicine

## 2012-07-10 DIAGNOSIS — Z862 Personal history of diseases of the blood and blood-forming organs and certain disorders involving the immune mechanism: Secondary | ICD-10-CM | POA: Insufficient documentation

## 2012-07-10 DIAGNOSIS — Z8614 Personal history of Methicillin resistant Staphylococcus aureus infection: Secondary | ICD-10-CM | POA: Insufficient documentation

## 2012-07-10 DIAGNOSIS — F17200 Nicotine dependence, unspecified, uncomplicated: Secondary | ICD-10-CM | POA: Insufficient documentation

## 2012-07-10 DIAGNOSIS — R079 Chest pain, unspecified: Secondary | ICD-10-CM | POA: Insufficient documentation

## 2012-07-10 DIAGNOSIS — Z8679 Personal history of other diseases of the circulatory system: Secondary | ICD-10-CM | POA: Insufficient documentation

## 2012-07-10 DIAGNOSIS — E039 Hypothyroidism, unspecified: Secondary | ICD-10-CM | POA: Insufficient documentation

## 2012-07-10 DIAGNOSIS — Z3202 Encounter for pregnancy test, result negative: Secondary | ICD-10-CM | POA: Insufficient documentation

## 2012-07-10 DIAGNOSIS — M545 Low back pain, unspecified: Secondary | ICD-10-CM | POA: Insufficient documentation

## 2012-07-10 DIAGNOSIS — R109 Unspecified abdominal pain: Secondary | ICD-10-CM | POA: Insufficient documentation

## 2012-07-10 DIAGNOSIS — Z8739 Personal history of other diseases of the musculoskeletal system and connective tissue: Secondary | ICD-10-CM | POA: Insufficient documentation

## 2012-07-10 DIAGNOSIS — R112 Nausea with vomiting, unspecified: Secondary | ICD-10-CM

## 2012-07-10 DIAGNOSIS — E669 Obesity, unspecified: Secondary | ICD-10-CM | POA: Insufficient documentation

## 2012-07-10 DIAGNOSIS — J45909 Unspecified asthma, uncomplicated: Secondary | ICD-10-CM | POA: Insufficient documentation

## 2012-07-10 DIAGNOSIS — N390 Urinary tract infection, site not specified: Secondary | ICD-10-CM

## 2012-07-10 DIAGNOSIS — R51 Headache: Secondary | ICD-10-CM | POA: Insufficient documentation

## 2012-07-10 DIAGNOSIS — Z8719 Personal history of other diseases of the digestive system: Secondary | ICD-10-CM | POA: Insufficient documentation

## 2012-07-10 DIAGNOSIS — Z8742 Personal history of other diseases of the female genital tract: Secondary | ICD-10-CM | POA: Insufficient documentation

## 2012-07-10 LAB — URINALYSIS, ROUTINE W REFLEX MICROSCOPIC
Glucose, UA: NEGATIVE mg/dL
Protein, ur: NEGATIVE mg/dL

## 2012-07-10 LAB — CBC WITH DIFFERENTIAL/PLATELET
Basophils Absolute: 0 10*3/uL (ref 0.0–0.1)
Basophils Relative: 0 % (ref 0–1)
Eosinophils Absolute: 0.3 10*3/uL (ref 0.0–0.7)
Eosinophils Relative: 3 % (ref 0–5)
HCT: 35.5 % — ABNORMAL LOW (ref 36.0–46.0)
MCHC: 32.7 g/dL (ref 30.0–36.0)
MCV: 79.2 fL (ref 78.0–100.0)
Monocytes Absolute: 0.7 10*3/uL (ref 0.1–1.0)
RDW: 14.3 % (ref 11.5–15.5)

## 2012-07-10 LAB — COMPREHENSIVE METABOLIC PANEL
AST: 24 U/L (ref 0–37)
Albumin: 3.6 g/dL (ref 3.5–5.2)
Calcium: 9.6 mg/dL (ref 8.4–10.5)
Creatinine, Ser: 0.83 mg/dL (ref 0.50–1.10)
GFR calc non Af Amer: >90 mL/min (ref >90)
Total Protein: 7.3 g/dL (ref 6.0–8.3)

## 2012-07-10 LAB — POCT PREGNANCY, URINE: Preg Test, Ur: NEGATIVE

## 2012-07-10 LAB — URINE MICROSCOPIC-ADD ON

## 2012-07-10 LAB — WET PREP, GENITAL

## 2012-07-10 MED ORDER — ONDANSETRON 4 MG PO TBDP
8.0000 mg | ORAL_TABLET | Freq: Once | ORAL | Status: AC
  Administered 2012-07-10: 8 mg via ORAL
  Filled 2012-07-10: qty 2

## 2012-07-10 MED ORDER — ONDANSETRON 8 MG PO TBDP: 8.0000 mg | ORAL_TABLET | Freq: Three times a day (TID) | ORAL | Status: DC | PRN

## 2012-07-10 MED ORDER — CIPROFLOXACIN HCL 500 MG PO TABS: 500.0000 mg | ORAL_TABLET | Freq: Two times a day (BID) | ORAL | Status: DC

## 2012-07-10 MED ORDER — CIPROFLOXACIN HCL 500 MG PO TABS
500.0000 mg | ORAL_TABLET | Freq: Once | ORAL | Status: AC
  Administered 2012-07-10: 500 mg via ORAL
  Filled 2012-07-10: qty 1

## 2012-07-10 NOTE — ED Notes (Signed)
Pt resting comfortably

## 2012-07-10 NOTE — ED Provider Notes (Addendum)
History     CSN: [id number]  Arrival date & time 07/10/12  0145   First MD Initiated Contact with Patient 07/10/12 0208      Chief Complaint  Patient presents with  . multiple complaints     (Consider location/radiation/quality/duration/timing/severity/associated sxs/prior treatment) HPI 27 year old female presents to emergency room with multiple complaints.  She is complaining of nausea for 2-3 weeks with vomiting 2-3 times a day, today vomited 5 times.  She complains of lower abdominal and back pain.  Back pain has been ongoing for about 8 months.  She reports 2 days of chest pain, and headache for one day.  No fevers no chills.  No dysuria.  No vaginal discharge.  Last menstrual period was 6 weeks ago.  Patient thinks that she may be pregnant, reports that she's had breast soreness and nausea, mainly in the morning.  She has not taken a home pregnancy test.  She does not have a primary care Dr.  She has taken no medication for her symptoms. Past Medical History  Diagnosis Date  . Asthma   . Scoliosis   . Blood type, Rh negative   . Pregnant state, incidental   . Anemia   . Chronic back pain     scolosis- on percocet  . Hyperemesis complicating pregnancy, antepartum     Zofran PRN  . Headache(784.0)   . Cholelithiasis   . Pregnancy induced hypertension   . Hypothyroidism   . 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 Cholecystectomy    . Adenoidectomy    . Dilation and curettage of uterus  2008    Family History  Problem Relation Age of Onset  . Heart disease Maternal Grandfather   . Diabetes Maternal Grandfather   . Anesthesia problems Neg Hx     History  Substance Use Topics  . Smoking status: Current Every Day Smoker -- 0.50 packs/day    Types: Cigarettes    Last Attempt to Quit: 11/17/2010  . Smokeless tobacco: Never Used  . Alcohol Use: No    OB History   Grav Para Term Preterm Abortions TAB  SAB Ect Mult Living   5 3 3  0 2 0 2 0 0 3      Review of Systems  All other systems reviewed and are negative.    Allergies  Review of patient's allergies indicates no known allergies.  Home Medications   Current Outpatient Rx  Name  Route  Sig  Dispense  Refill  . aspirin-acetaminophen-caffeine (EXCEDRIN MIGRAINE) 250-250-65 MG per tablet   Oral   Take 1 tablet by mouth every 6 (six) hours as needed for pain.         Marland Kitchen ibuprofen (ADVIL,MOTRIN) 200 MG tablet   Oral   Take 200 mg by mouth every 6 (six) hours as needed for pain.         . ciprofloxacin (CIPRO) 500 MG tablet   Oral   Take 1 tablet (500 mg total) by mouth 2 (two) times daily.   10 tablet   0   . ondansetron (ZOFRAN-ODT) 8 MG disintegrating tablet   Oral   Take 1 tablet (8 mg total) by mouth every 8 (eight) hours as needed for nausea.   20 tablet   0     BP 122/89  Pulse 63  Temp(Src) 98.3 F (36.8 C) (Oral)  Resp 18  SpO2 98%  LMP  05/21/2012  Physical Exam  Nursing note and vitals reviewed. Constitutional: She is oriented to person, place, and time. She appears well-developed and well-nourished.  Obese, in no acute distress  HENT:  Head: Normocephalic and atraumatic.  Right Ear: External ear normal.  Left Ear: External ear normal.  Nose: Nose normal.  Mouth/Throat: Oropharynx is clear and moist.  Eyes: Conjunctivae and EOM are normal. Pupils are equal, round, and reactive to light.  Neck: Normal range of motion. Neck supple. No JVD present. No tracheal deviation present. No thyromegaly present.  Cardiovascular: Normal rate, regular rhythm, normal heart sounds and intact distal pulses.  Exam reveals no gallop and no friction rub.   No murmur heard. Pulmonary/Chest: Effort normal and breath sounds normal. No stridor. No respiratory distress. She has no wheezes. She has no rales. She exhibits no tenderness.  Abdominal: Soft. Bowel sounds are normal. She exhibits no distension and no mass.  There is tenderness (mild suprapubic tenderness). There is no rebound and no guarding.  Genitourinary:  External genitalia normal Vagina with bloody, mucousy discharge Cervix closed no lesions No cervical motion tenderness Adnexa palpated, no masses or tenderness noted Bladder palpated no tenderness Uterus palpated no masses or tenderness    Musculoskeletal: Normal range of motion. She exhibits no edema and no tenderness.  Lymphadenopathy:    She has no cervical adenopathy.  Neurological: She is alert and oriented to person, place, and time. She exhibits normal muscle tone. Coordination normal.  Skin: Skin is warm and dry. No rash noted. No erythema. No pallor.  Psychiatric: She has a normal mood and affect. Her behavior is normal. Judgment and thought content normal.    ED Course  Procedures (including critical care time)  Labs Reviewed  WET PREP, GENITAL - Abnormal; Notable for the following:    Clue Cells Wet Prep HPF POC FEW (*)    WBC, Wet Prep HPF POC TOO NUMEROUS TO COUNT (*)    All other components within normal limits  CBC WITH DIFFERENTIAL - Abnormal; Notable for the following:    Hemoglobin 11.6 (*)    HCT 35.5 (*)    MCH 25.9 (*)    All other components within normal limits  COMPREHENSIVE METABOLIC PANEL - Abnormal; Notable for the following:    Total Bilirubin 0.2 (*)    All other components within normal limits  URINALYSIS, ROUTINE W REFLEX MICROSCOPIC - Abnormal; Notable for the following:    Hgb urine dipstick MODERATE (*)    Leukocytes, UA MODERATE (*)    All other components within normal limits  URINE MICROSCOPIC-ADD ON - Abnormal; Notable for the following:    Squamous Epithelial / LPF FEW (*)    All other components within normal limits  GC/CHLAMYDIA PROBE AMP  LIPASE, BLOOD  POCT PREGNANCY, URINE   No results found.   Date: 07/10/2012  Rate: 72  Rhythm: normal sinus rhythm and sinus arrhythmia  QRS Axis: normal  Intervals: normal  ST/T Wave  abnormalities: normal  Conduction Disutrbances:none  Narrative Interpretation:   Old EKG Reviewed: unchanged    1. Urinary tract infection   2. Nausea and vomiting       MDM  A 50-year-old female with multiple complaints.  Patient found to be not pregnant.  She does have a urinary tract infection.  Patient with multiple bolus.  On her pelvic exam, but no overt signs of PID.  Patient appears to be starting her period.  We'll wait for cultures for GC and Chlamydia at  this time.  Will treat for her urinary tract infection.  And give her resources for outpatient followup        Olivia Mackie, MD 07/10/12 1610  Olivia Mackie, MD 07/10/12 [phone]

## 2012-07-10 NOTE — ED Notes (Signed)
Pt c/o chest and abd pain with n v for 3 days.  She also has anxiety and has had bouts of the same.  Heat flashes and sweating.  She has multiple mosquito bites.   lmp may 2

## 2012-07-10 NOTE — ED Notes (Signed)
Pt alert and oriented, with steady gait at time of discharge. Pt given discharge papers and papers explained. All questions answered and pt walked to discharge.  

## 2012-07-13 ENCOUNTER — Emergency Department (HOSPITAL_COMMUNITY)
Admission: EM | Admit: 2012-07-13 | Discharge: 2012-07-13 | Disposition: A | Discharge status: Home / Self Care | Payer: Medicaid Other | Attending: Emergency Medicine | Admitting: Emergency Medicine

## 2012-07-13 ENCOUNTER — Encounter (HOSPITAL_COMMUNITY): Payer: Self-pay | Admitting: Emergency Medicine

## 2012-07-13 DIAGNOSIS — J45909 Unspecified asthma, uncomplicated: Secondary | ICD-10-CM | POA: Insufficient documentation

## 2012-07-13 DIAGNOSIS — Z8639 Personal history of other endocrine, nutritional and metabolic disease: Secondary | ICD-10-CM | POA: Insufficient documentation

## 2012-07-13 DIAGNOSIS — Z8719 Personal history of other diseases of the digestive system: Secondary | ICD-10-CM | POA: Insufficient documentation

## 2012-07-13 DIAGNOSIS — G8929 Other chronic pain: Secondary | ICD-10-CM | POA: Insufficient documentation

## 2012-07-13 DIAGNOSIS — Z8619 Personal history of other infectious and parasitic diseases: Secondary | ICD-10-CM | POA: Insufficient documentation

## 2012-07-13 DIAGNOSIS — M549 Dorsalgia, unspecified: Secondary | ICD-10-CM | POA: Insufficient documentation

## 2012-07-13 DIAGNOSIS — R51 Headache: Secondary | ICD-10-CM | POA: Insufficient documentation

## 2012-07-13 DIAGNOSIS — Z862 Personal history of diseases of the blood and blood-forming organs and certain disorders involving the immune mechanism: Secondary | ICD-10-CM | POA: Insufficient documentation

## 2012-07-13 DIAGNOSIS — Z87440 Personal history of urinary (tract) infections: Secondary | ICD-10-CM | POA: Insufficient documentation

## 2012-07-13 DIAGNOSIS — F17200 Nicotine dependence, unspecified, uncomplicated: Secondary | ICD-10-CM | POA: Insufficient documentation

## 2012-07-13 DIAGNOSIS — H53149 Visual discomfort, unspecified: Secondary | ICD-10-CM | POA: Insufficient documentation

## 2012-07-13 DIAGNOSIS — Z8739 Personal history of other diseases of the musculoskeletal system and connective tissue: Secondary | ICD-10-CM | POA: Insufficient documentation

## 2012-07-13 DIAGNOSIS — R110 Nausea: Secondary | ICD-10-CM | POA: Insufficient documentation

## 2012-07-13 MED ORDER — SODIUM CHLORIDE 0.9 % IV BOLUS (SEPSIS)
500.0000 mL | Freq: Once | INTRAVENOUS | Status: AC
  Administered 2012-07-13: 500 mL via INTRAVENOUS

## 2012-07-13 MED ORDER — AZITHROMYCIN 250 MG PO TABS
1000.0000 mg | ORAL_TABLET | Freq: Once | ORAL | Status: AC
  Administered 2012-07-13: 1000 mg via ORAL
  Filled 2012-07-13: qty 4

## 2012-07-13 MED ORDER — DEXAMETHASONE SODIUM PHOSPHATE 10 MG/ML IJ SOLN
10.0000 mg | Freq: Once | INTRAMUSCULAR | Status: AC
  Administered 2012-07-13: 10 mg via INTRAVENOUS
  Filled 2012-07-13: qty 1

## 2012-07-13 MED ORDER — METOCLOPRAMIDE HCL 5 MG/ML IJ SOLN
10.0000 mg | Freq: Once | INTRAMUSCULAR | Status: AC
  Administered 2012-07-13: 10 mg via INTRAVENOUS
  Filled 2012-07-13: qty 2

## 2012-07-13 MED ORDER — KETOROLAC TROMETHAMINE 30 MG/ML IJ SOLN
30.0000 mg | Freq: Once | INTRAMUSCULAR | Status: AC
  Administered 2012-07-13: 30 mg via INTRAVENOUS
  Filled 2012-07-13: qty 1

## 2012-07-13 MED ORDER — DIPHENHYDRAMINE HCL 50 MG/ML IJ SOLN
25.0000 mg | Freq: Once | INTRAMUSCULAR | Status: AC
  Administered 2012-07-13: 25 mg via INTRAVENOUS
  Filled 2012-07-13: qty 1

## 2012-07-13 NOTE — ED Notes (Signed)
PT. REPORTS MIGRAINE HEADACHE FOR 3 DAYS WITH EMESIS UNRELIEVED BY IBUPROFEN AND PERCOCET .

## 2012-07-13 NOTE — ED Provider Notes (Signed)
History    CSN: [id number] Arrival date & time 07/13/12  0230  First MD Initiated Contact with Patient 07/13/12 [phone]     Chief Complaint  Patient presents with  . Headache   (Consider location/radiation/quality/duration/timing/severity/associated sxs/prior Treatment) HPI Comments: Patient presents with a headache.  Headache gradual in onset and has been constant for the past 3 days.  Headache located in the left temporal region.  Pain does not radiate.  She describes the pain as a throbbing pain.  She reports that her headache is similar to headaches that she has had in the past.  She reports that she has taken Ibuprofen and Percocet for the pain, which she does not feel that it helped.  She reports that the headache is associated with some nausea and photophobia.  She denies vomiting.  She denies neck pain or stiffness.  Denies fever or chills.  Denies vision changes.  She was seen in the ED three days ago for abdominal pain and headache.  At that time she was diagnosed with a UTI.  She denies any abdominal pain or urinary symptoms at this time.  Patient is a 27 y.o. female presenting with headaches. The history is provided by the patient.  Headache Radiates to:  Does not radiate Timing:  Constant Associated symptoms: no fever, no neck pain and no neck stiffness    Past Medical History  Diagnosis Date  . Asthma   . Scoliosis   . Blood type, Rh negative   . Pregnant state, incidental   . Anemia   . Chronic back pain     scolosis- on percocet  . Hyperemesis complicating pregnancy, antepartum     Zofran PRN  . Headache(784.0)   . Cholelithiasis   . Pregnancy induced hypertension   . Hypothyroidism   . Infection     hx MRSA; 3 negative tests since   Past Surgical History  Procedure Laterality Date  . Cholecystectomy    . Adenoidectomy    . Dilation and curettage of uterus  2008   Family History  Problem Relation Age of Onset  . Heart disease Maternal Grandfather   .  Diabetes Maternal Grandfather   . Anesthesia problems Neg Hx    History  Substance Use Topics  . Smoking status: Current Every Day Smoker -- 0.50 packs/day    Types: Cigarettes    Last Attempt to Quit: 11/17/2010  . Smokeless tobacco: Never Used  . Alcohol Use: No   OB History   Grav Para Term Preterm Abortions TAB SAB Ect Mult Living   5 3 3  0 2 0 2 0 0 3     Review of Systems  Constitutional: Negative for fever and chills.  HENT: Negative for neck pain and neck stiffness.   Neurological: Positive for headaches.  All other systems reviewed and are negative.    Allergies  Review of patient's allergies indicates no known allergies.  Home Medications   Current Outpatient Rx  Name  Route  Sig  Dispense  Refill  . aspirin-acetaminophen-caffeine (EXCEDRIN MIGRAINE) 250-250-65 MG per tablet   Oral   Take 1 tablet by mouth every 6 (six) hours as needed for pain.         . ciprofloxacin (CIPRO) 500 MG tablet   Oral   Take 1 tablet (500 mg total) by mouth 2 (two) times daily.   10 tablet   0   . ibuprofen (ADVIL,MOTRIN) 200 MG tablet   Oral   Take 200 mg by  mouth every 6 (six) hours as needed for pain.         Marland Kitchen ondansetron (ZOFRAN-ODT) 8 MG disintegrating tablet   Oral   Take 1 tablet (8 mg total) by mouth every 8 (eight) hours as needed for nausea.   20 tablet   0    BP 105/66  Pulse 71  Temp(Src) 98.2 F (36.8 C) (Oral)  Resp 20  SpO2 95%  LMP 07/11/2012 Physical Exam  Nursing note and vitals reviewed. Constitutional: She appears well-developed and well-nourished.  HENT:  Head: Normocephalic and atraumatic.  Mouth/Throat: Oropharynx is clear and moist.  Eyes: EOM are normal. Pupils are equal, round, and reactive to light.  Neck: Normal range of motion. Neck supple.  Cardiovascular: Normal rate, regular rhythm and normal heart sounds.   Pulmonary/Chest: Effort normal and breath sounds normal.  Neurological: She is alert. She has normal strength. No  cranial nerve deficit or sensory deficit. Coordination and gait normal.  Normal finger to nose testing Normal rapid alternating movements.  Skin: Skin is warm and dry. No rash noted.  Psychiatric: She has a normal mood and affect.    ED Course  Procedures (including critical care time) Labs Reviewed - No data to display No results found. No diagnosis found.  10:00 AM Patient reports that her headache has improved at this time.  MDM  Pt HA treated and improved while in ED.  Presentation is like pts typical HA and non concerning for [facility], ICH, Meningitis, or temporal arteritis. Pt is afebrile with no focal neuro deficits, nuchal rigidity, or change in vision. Pt is to follow up with PCP to discuss prophylactic medication. Pt verbalizes understanding and is agreeable with plan to dc.   Pascal Lux Wheatcroft, PA-C 07/13/12 1610

## 2012-07-13 NOTE — ED Provider Notes (Signed)
Medical screening examination/treatment/procedure(s) were performed by non-physician practitioner and as supervising physician I was immediately available for consultation/collaboration.  Sunnie Nielsen, MD 07/13/12 2107

## 2012-07-13 NOTE — ED Notes (Signed)
Removed pt's iv before pt left for discharge 

## 2012-07-13 NOTE — ED Notes (Signed)
C/o migraine x 1 week, nausea no emesis, +photophobia. States meds taken at home not helping. Has taken ibuprofen, tylenol, aleve, percocet. Reports was seen in ED few days ago for same

## 2012-07-16 NOTE — ED Notes (Signed)
+   Chlamydia Patient treated with  Zithromax-

## 2012-07-16 NOTE — ED Notes (Signed)
Chart returned from EDP office   

## 2012-07-17 ENCOUNTER — Encounter (HOSPITAL_COMMUNITY): Payer: Self-pay | Admitting: Emergency Medicine

## 2012-07-17 ENCOUNTER — Emergency Department (HOSPITAL_COMMUNITY)
Admission: EM | Admit: 2012-07-17 | Discharge: 2012-07-18 | Disposition: A | Discharge status: Home / Self Care | Payer: Medicaid Other | Attending: Emergency Medicine | Admitting: Emergency Medicine

## 2012-07-17 DIAGNOSIS — Z8639 Personal history of other endocrine, nutritional and metabolic disease: Secondary | ICD-10-CM | POA: Insufficient documentation

## 2012-07-17 DIAGNOSIS — J45909 Unspecified asthma, uncomplicated: Secondary | ICD-10-CM | POA: Insufficient documentation

## 2012-07-17 DIAGNOSIS — E669 Obesity, unspecified: Secondary | ICD-10-CM | POA: Insufficient documentation

## 2012-07-17 DIAGNOSIS — G43909 Migraine, unspecified, not intractable, without status migrainosus: Secondary | ICD-10-CM | POA: Insufficient documentation

## 2012-07-17 DIAGNOSIS — Z789 Other specified health status: Secondary | ICD-10-CM | POA: Insufficient documentation

## 2012-07-17 DIAGNOSIS — F17200 Nicotine dependence, unspecified, uncomplicated: Secondary | ICD-10-CM | POA: Insufficient documentation

## 2012-07-17 DIAGNOSIS — Z862 Personal history of diseases of the blood and blood-forming organs and certain disorders involving the immune mechanism: Secondary | ICD-10-CM | POA: Insufficient documentation

## 2012-07-17 DIAGNOSIS — G8929 Other chronic pain: Secondary | ICD-10-CM | POA: Insufficient documentation

## 2012-07-17 DIAGNOSIS — Z7982 Long term (current) use of aspirin: Secondary | ICD-10-CM | POA: Insufficient documentation

## 2012-07-17 DIAGNOSIS — Z8739 Personal history of other diseases of the musculoskeletal system and connective tissue: Secondary | ICD-10-CM | POA: Insufficient documentation

## 2012-07-17 DIAGNOSIS — Z8614 Personal history of Methicillin resistant Staphylococcus aureus infection: Secondary | ICD-10-CM | POA: Insufficient documentation

## 2012-07-17 DIAGNOSIS — Z79899 Other long term (current) drug therapy: Secondary | ICD-10-CM | POA: Insufficient documentation

## 2012-07-17 DIAGNOSIS — Z8719 Personal history of other diseases of the digestive system: Secondary | ICD-10-CM | POA: Insufficient documentation

## 2012-07-17 HISTORY — DX: Obesity, unspecified: E66.9

## 2012-07-17 MED ORDER — CEFTRIAXONE SODIUM 250 MG IJ SOLR
250.0000 mg | Freq: Once | INTRAMUSCULAR | Status: AC
  Administered 2012-07-17: 250 mg via INTRAMUSCULAR
  Filled 2012-07-17: qty 250

## 2012-07-17 MED ORDER — DEXAMETHASONE SODIUM PHOSPHATE 10 MG/ML IJ SOLN
10.0000 mg | Freq: Once | INTRAMUSCULAR | Status: AC
  Administered 2012-07-17: 10 mg via INTRAVENOUS
  Filled 2012-07-17: qty 1

## 2012-07-17 MED ORDER — METOCLOPRAMIDE HCL 5 MG/ML IJ SOLN
10.0000 mg | Freq: Once | INTRAMUSCULAR | Status: AC
  Administered 2012-07-17: 10 mg via INTRAVENOUS
  Filled 2012-07-17: qty 2

## 2012-07-17 MED ORDER — SODIUM CHLORIDE 0.9 % IV BOLUS (SEPSIS)
1000.0000 mL | Freq: Once | INTRAVENOUS | Status: AC
  Administered 2012-07-17: 1000 mL via INTRAVENOUS

## 2012-07-17 MED ORDER — DEXTROSE 5 % IV SOLN: 250.0000 mg | Freq: Once | INTRAVENOUS | Status: DC

## 2012-07-17 MED ORDER — DIPHENHYDRAMINE HCL 50 MG/ML IJ SOLN
25.0000 mg | Freq: Once | INTRAMUSCULAR | Status: AC
  Administered 2012-07-17: 25 mg via INTRAVENOUS
  Filled 2012-07-17: qty 1

## 2012-07-17 MED ORDER — AZITHROMYCIN 250 MG PO TABS
1000.0000 mg | ORAL_TABLET | Freq: Once | ORAL | Status: AC
  Administered 2012-07-17: 1000 mg via ORAL
  Filled 2012-07-17: qty 4

## 2012-07-17 NOTE — ED Provider Notes (Signed)
History    CSN: [id number] Arrival date & time 07/17/12  2016  First MD Initiated Contact with Patient 07/17/12 2133     Chief Complaint  Patient presents with  . Migraine   (Consider location/radiation/quality/duration/timing/severity/associated sxs/prior Treatment) Patient is a 27 y.o. female presenting with migraines.  Migraine This is a chronic problem. Episode onset: 4 weeks ago. The problem occurs intermittently. The problem has been unchanged. Pertinent negatives include no abdominal pain, chest pain, chills, congestion, coughing, fever, nausea, numbness, rash, sore throat, vomiting or weakness. Nothing aggravates the symptoms. She has tried nothing for the symptoms.   Past Medical History  Diagnosis Date  . Asthma   . Scoliosis   . Blood type, Rh negative   . Pregnant state, incidental   . Anemia   . Chronic back pain     scolosis- on percocet  . Hyperemesis complicating pregnancy, antepartum     Zofran PRN  . Headache(784.0)   . Cholelithiasis   . Pregnancy induced hypertension   . Hypothyroidism   . 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 Obesity    Past Surgical History  Procedure Laterality Date  . Cholecystectomy    . Adenoidectomy    . Dilation and curettage of uterus  2008   Family History  Problem Relation Age of Onset  . Heart disease Maternal Grandfather   . Diabetes Maternal Grandfather   . Anesthesia problems Neg Hx    History  Substance Use Topics  . Smoking status: Current Every Day Smoker -- 0.50 packs/day    Types: Cigarettes    Last Attempt to Quit: 11/17/2010  . Smokeless tobacco: Never Used  . Alcohol Use: No   OB History   Grav Para Term Preterm Abortions TAB SAB Ect Mult Living   5 3 3  0 2 0 2 0 0 3     Review of Systems  Constitutional: Negative for fever and chills.  HENT: Negative for congestion, sore throat and rhinorrhea.   Eyes: Negative for photophobia and visual disturbance.  Respiratory: Negative for  cough and shortness of breath.   Cardiovascular: Negative for chest pain and leg swelling.  Gastrointestinal: Negative for nausea, vomiting, abdominal pain, diarrhea and constipation.  Endocrine: Negative for polyphagia and polyuria.  Genitourinary: Negative for dysuria, flank pain, vaginal bleeding, vaginal discharge and enuresis.  Musculoskeletal: Negative for back pain and gait problem.  Skin: Negative for color change and rash.  Neurological: Negative for dizziness, syncope, weakness, light-headedness and numbness.  Hematological: Negative for adenopathy. Does not bruise/bleed easily.  All other systems reviewed and are negative.    Allergies  Review of patient's allergies indicates no known allergies.  Home Medications   Current Outpatient Rx  Name  Route  Sig  Dispense  Refill  . acetaminophen (TYLENOL) 500 MG tablet   Oral   Take 1,000 mg by mouth every 8 (eight) hours as needed for pain.         Marland Kitchen aspirin-acetaminophen-caffeine (EXCEDRIN MIGRAINE) 250-250-65 MG per tablet   Oral   Take 1 tablet by mouth every 6 (six) hours as needed (for headache).          . ciprofloxacin (CIPRO) 500 MG tablet   Oral   Take 500 mg by mouth 2 (two) times daily.         Marland Kitchen ibuprofen (ADVIL,MOTRIN) 200 MG tablet   Oral   Take 800 mg by mouth every 8 (eight) hours as needed for  pain.         . ondansetron (ZOFRAN-ODT) 8 MG disintegrating tablet   Oral   Take 8 mg by mouth every 8 (eight) hours as needed for nausea.          BP 111/77  Pulse 83  Temp(Src) 99.3 F (37.4 C) (Oral)  Resp 14  SpO2 99%  LMP 07/11/2012 Physical Exam  Vitals reviewed. Constitutional: She is oriented to person, place, and time. She appears well-developed and well-nourished.  HENT:  Head: Normocephalic and atraumatic.  Right Ear: External ear normal.  Left Ear: External ear normal.  Eyes: Conjunctivae and EOM are normal. Pupils are equal, round, and reactive to light.  Neck: Normal range of  motion. Neck supple.  Cardiovascular: Normal rate, regular rhythm, normal heart sounds and intact distal pulses.   Pulmonary/Chest: Effort normal and breath sounds normal.  Abdominal: Soft. Bowel sounds are normal. There is no tenderness.  Musculoskeletal: Normal range of motion.  Neurological: She is alert and oriented to person, place, and time. She has normal strength and normal reflexes. No cranial nerve deficit or sensory deficit. Gait normal. GCS eye subscore is 4. GCS verbal subscore is 5. GCS motor subscore is 6.  Skin: Skin is warm and dry.    ED Course  Procedures (including critical care time) Labs Reviewed - No data to display No results found. 1. Migraine     MDM   27 y.o. female  with pertinent PMH of migraine presents with ha x 4 weeks, intermittent.  Pt presents today because of continued, rather than worsening, pain, and denies concerning findings such as neuro deficit, syncope, seizure, abrupt onset, exertional nature, visual complaint, or other symptoms.  Physical exam benign.  Pt has CT + vaginal swab for which she has not been treated.  No abd tenderness or signs of PID.  Given Rocephin/Azithro.  HA improved with migraine cocktail (reglan, benadryl, decadron).  Given strict return precautions for concerning features.  She voiced understanding and agreed to fu.    Labs and imaging as above reviewed by myself and attending,Dr. Bebe Shaggy, with whom case was discussed.   1. Migraine       Noel Gerold, MD 07/17/12 2359

## 2012-07-17 NOTE — ED Notes (Addendum)
2115  Pt arrives to the room ambulatory with migraine like symptoms that have been taking place for over a month and she states OTC med's are not helping.  In the bed waiting for the DR  2215  Pt resting comfortably at this time will continue to monitor   2300  Meds given.  Pt tolerated well  IV NS running will continue to monitor  2311 Headache is easing off

## 2012-07-17 NOTE — ED Notes (Signed)
PT. REPORTS INTERMITTENT MIGRAINE HEADACHE FOR 1 MONTH UNRELIEVED BY OTC TYLENOL AND IBUPROFEN .

## 2012-07-18 ENCOUNTER — Telehealth (HOSPITAL_COMMUNITY): Payer: Self-pay | Admitting: Emergency Medicine

## 2012-07-21 NOTE — ED Provider Notes (Signed)
I have personally seen and examined the patient.  I have discussed the plan of care with the resident.  I have reviewed the documentation on PMH/FH/Soc. History.  I have reviewed the documentation of the resident and agree.  Pt appeared well, conversant, no distress and without neuro deficits Stable for d/c home   Joya Gaskins, MD 07/21/12 [phone]

## 2012-07-26 ENCOUNTER — Encounter (HOSPITAL_COMMUNITY): Payer: Self-pay | Admitting: *Deleted

## 2012-07-26 ENCOUNTER — Emergency Department (HOSPITAL_COMMUNITY)
Admission: EM | Admit: 2012-07-26 | Discharge: 2012-07-27 | Disposition: A | Discharge status: Home / Self Care | Payer: Medicaid Other | Attending: Emergency Medicine | Admitting: Emergency Medicine

## 2012-07-26 DIAGNOSIS — G8929 Other chronic pain: Secondary | ICD-10-CM | POA: Insufficient documentation

## 2012-07-26 DIAGNOSIS — J45909 Unspecified asthma, uncomplicated: Secondary | ICD-10-CM | POA: Insufficient documentation

## 2012-07-26 DIAGNOSIS — G43909 Migraine, unspecified, not intractable, without status migrainosus: Secondary | ICD-10-CM | POA: Insufficient documentation

## 2012-07-26 DIAGNOSIS — Z8639 Personal history of other endocrine, nutritional and metabolic disease: Secondary | ICD-10-CM | POA: Insufficient documentation

## 2012-07-26 DIAGNOSIS — R609 Edema, unspecified: Secondary | ICD-10-CM | POA: Insufficient documentation

## 2012-07-26 DIAGNOSIS — Z8719 Personal history of other diseases of the digestive system: Secondary | ICD-10-CM | POA: Insufficient documentation

## 2012-07-26 DIAGNOSIS — R112 Nausea with vomiting, unspecified: Secondary | ICD-10-CM | POA: Insufficient documentation

## 2012-07-26 DIAGNOSIS — R600 Localized edema: Secondary | ICD-10-CM

## 2012-07-26 DIAGNOSIS — H53149 Visual discomfort, unspecified: Secondary | ICD-10-CM | POA: Insufficient documentation

## 2012-07-26 DIAGNOSIS — Z8739 Personal history of other diseases of the musculoskeletal system and connective tissue: Secondary | ICD-10-CM | POA: Insufficient documentation

## 2012-07-26 DIAGNOSIS — Z8614 Personal history of Methicillin resistant Staphylococcus aureus infection: Secondary | ICD-10-CM | POA: Insufficient documentation

## 2012-07-26 DIAGNOSIS — E669 Obesity, unspecified: Secondary | ICD-10-CM | POA: Insufficient documentation

## 2012-07-26 DIAGNOSIS — Z862 Personal history of diseases of the blood and blood-forming organs and certain disorders involving the immune mechanism: Secondary | ICD-10-CM | POA: Insufficient documentation

## 2012-07-26 DIAGNOSIS — Z79899 Other long term (current) drug therapy: Secondary | ICD-10-CM | POA: Insufficient documentation

## 2012-07-26 DIAGNOSIS — Z3202 Encounter for pregnancy test, result negative: Secondary | ICD-10-CM | POA: Insufficient documentation

## 2012-07-26 DIAGNOSIS — F17200 Nicotine dependence, unspecified, uncomplicated: Secondary | ICD-10-CM | POA: Insufficient documentation

## 2012-07-26 DIAGNOSIS — Z792 Long term (current) use of antibiotics: Secondary | ICD-10-CM | POA: Insufficient documentation

## 2012-07-26 LAB — BASIC METABOLIC PANEL
BUN: 13 mg/dL (ref 6–23)
Calcium: 9.2 mg/dL (ref 8.4–10.5)
Chloride: 104 mEq/L (ref 96–112)
Creatinine, Ser: 0.74 mg/dL (ref 0.50–1.10)
GFR calc Af Amer: >90 mL/min (ref >90)
GFR calc non Af Amer: >90 mL/min (ref >90)

## 2012-07-26 LAB — CBC WITH DIFFERENTIAL/PLATELET
Basophils Absolute: 0 10*3/uL (ref 0.0–0.1)
Basophils Relative: 0 % (ref 0–1)
Eosinophils Absolute: 0.3 10*3/uL (ref 0.0–0.7)
HCT: 30.8 % — ABNORMAL LOW (ref 36.0–46.0)
MCH: 26.2 pg (ref 26.0–34.0)
MCHC: 32.8 g/dL (ref 30.0–36.0)
Monocytes Absolute: 0.8 10*3/uL (ref 0.1–1.0)
Neutro Abs: 4.7 10*3/uL (ref 1.7–7.7)
RDW: 15 % (ref 11.5–15.5)

## 2012-07-26 LAB — PRO B NATRIURETIC PEPTIDE: Pro B Natriuretic peptide (BNP): 107 pg/mL (ref 0–125)

## 2012-07-26 MED ORDER — DIPHENHYDRAMINE HCL 50 MG/ML IJ SOLN
25.0000 mg | Freq: Once | INTRAMUSCULAR | Status: AC
  Administered 2012-07-27: 25 mg via INTRAVENOUS
  Filled 2012-07-26: qty 1

## 2012-07-26 MED ORDER — PROCHLORPERAZINE EDISYLATE 5 MG/ML IJ SOLN
10.0000 mg | Freq: Four times a day (QID) | INTRAMUSCULAR | Status: DC | PRN
Start: 2012-07-26 — End: 2012-07-27
  Administered 2012-07-27: 10 mg via INTRAVENOUS
  Filled 2012-07-26: qty 2

## 2012-07-26 MED ORDER — KETOROLAC TROMETHAMINE 30 MG/ML IJ SOLN
30.0000 mg | Freq: Once | INTRAMUSCULAR | Status: AC
  Administered 2012-07-27: 30 mg via INTRAVENOUS
  Filled 2012-07-26: qty 1

## 2012-07-26 NOTE — ED Provider Notes (Signed)
History    CSN: [id number] Arrival date & time 07/26/12  2214  First MD Initiated Contact with Patient 07/26/12 2300     Chief Complaint  Patient presents with  . Leg Swelling  . Migraine    HPI Jacqueline Clarke is a 27 y.o. female with multiple complaints. Patient has been in emergency department complaining about migraine headache multiple times of the last few weeks, she has not been taking any medicine for her migraines emphasize ibuprofen and Excedrin, she says she takes these medicines daily. She says this particular migraine has been intermittent, and going over the last 3 weeks, it worsened tonight, associated with photophobia, it is a throbbing sensation in the left temple, starts in the left trapezius muscle radiates up the side of her head, typically relieved with ibuprofen and Excedrin however not this evening. Sometimes she says these headaches are worse in the morning. She says this is typical of her migraine headache, it began gradually, there is no exertional onset. She is also associated with nausea, vomiting times one yesterday, she's had 3-4 loose stools daily over the last 4 days. She is complaining about lower extremity swelling bilaterally is equal, this is been going on for one week.  This is associated with a stretching sensation which is moderate in intensity. Last menstrual period was 3 days ago. No blood in the stools.  No fevers, no chills. Patient has had some intermittent chest pains and some intermittent shortness of breath.  Patient has no history of DVT, cancer treatment, has not been bedridden or had any recent long periods of travel, she does not have a unilateral leg swelling, she denies any varicose veins.   Past Medical History  Diagnosis Date  . Asthma   . Scoliosis   . Blood type, Rh negative   . Pregnant state, incidental   . Anemia   . Chronic back pain     scolosis- on percocet  . Hyperemesis complicating pregnancy, antepartum     Zofran  PRN  . Headache(784.0)   . Cholelithiasis   . Pregnancy induced hypertension   . Hypothyroidism   . 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 Obesity    Past Surgical History  Procedure Laterality Date  . Cholecystectomy    . Adenoidectomy    . Dilation and curettage of uterus  2008   Family History  Problem Relation Age of Onset  . Heart disease Maternal Grandfather   . Diabetes Maternal Grandfather   . Anesthesia problems Neg Hx    History  Substance Use Topics  . Smoking status: Current Every Day Smoker -- 0.50 packs/day    Types: Cigarettes    Last Attempt to Quit: 11/17/2010  . Smokeless tobacco: Never Used  . Alcohol Use: No   OB History   Grav Para Term Preterm Abortions TAB SAB Ect Mult Living   5 3 3  0 2 0 2 0 0 3     Review of Systems At least 10pt or greater review of systems completed and are negative except where specified in the HPI.  Allergies  Review of patient's allergies indicates no known allergies.  Home Medications   Current Outpatient Rx  Name  Route  Sig  Dispense  Refill  . acetaminophen (TYLENOL) 500 MG tablet   Oral   Take 2,000 mg by mouth every 8 (eight) hours as needed for pain.          Marland Kitchen aspirin-acetaminophen-caffeine (  EXCEDRIN MIGRAINE) 250-250-65 MG per tablet   Oral   Take 1 tablet by mouth every 6 (six) hours as needed (for headache).          . ciprofloxacin (CIPRO) 500 MG tablet   Oral   Take 500 mg by mouth 2 (two) times daily.         Marland Kitchen ibuprofen (ADVIL,MOTRIN) 200 MG tablet   Oral   Take 800 mg by mouth every 8 (eight) hours as needed for pain.         . Multiple Vitamin (MULTIVITAMIN WITH MINERALS) TABS   Oral   Take 1 tablet by mouth every morning.         . ondansetron (ZOFRAN-ODT) 8 MG disintegrating tablet   Oral   Take 8 mg by mouth every 8 (eight) hours as needed for nausea.         Marland Kitchen oxyCODONE-acetaminophen (PERCOCET) 10-325 MG per tablet   Oral   Take 1 tablet by mouth every 4  (four) hours as needed for pain.          BP 116/72  Pulse 68  Temp(Src) 99.1 F (37.3 C)  Resp 20  Wt 261 lb (118.389 kg)  BMI 37.45 kg/m2  SpO2 99%  LMP 07/11/2012 Physical Exam  Nursing notes reviewed.  Electronic medical record reviewed. VITAL SIGNS:   Filed Vitals:   07/26/12 2234  BP: 116/72  Pulse: 68  Temp: 99.1 F (37.3 C)  Resp: 20  Weight: 261 lb (118.389 kg)  SpO2: 99%   CONSTITUTIONAL: Awake, oriented, appears non-toxic HENT: Atraumatic, normocephalic, oral mucosa pink and moist, airway patent. Nares patent without drainage. External ears normal. EYES: Conjunctiva clear, EOMI, PERRLA NECK: Trachea midline, non-tender, supple CARDIOVASCULAR: Normal heart rate, Normal rhythm, No murmurs, rubs, gallops PULMONARY/CHEST: Clear to auscultation, no rhonchi, wheezes, or rales. Symmetrical breath sounds. Non-tender. ABDOMINAL: Non-distended, obese, soft, non-tender - no rebound or guarding.  BS normal. NEUROLOGIC: Non-focal, moving all four extremities, no gross sensory or motor deficits. EXTREMITIES: No clubbing, cyanosis.  1+ lower extremity pitting edema bilaterally. No varicosities. Lower extremities are mildly tender to palpation globally. SKIN: Warm, Dry, No erythema, No rash  ED Course  Procedures (including critical care time) Labs Reviewed  CBC WITH DIFFERENTIAL - Abnormal; Notable for the following:    RBC 3.86 (*)    Hemoglobin 10.1 (*)    HCT 30.8 (*)    All other components within normal limits  BASIC METABOLIC PANEL - Abnormal; Notable for the following:    Potassium 3.4 (*)    All other components within normal limits  URINALYSIS, ROUTINE W REFLEX MICROSCOPIC - Abnormal; Notable for the following:    APPearance CLOUDY (*)    All other components within normal limits  HEPATIC FUNCTION PANEL - Abnormal; Notable for the following:    Albumin 3.2 (*)    Total Bilirubin 0.2 (*)    All other components within normal limits  PRO B NATRIURETIC  PEPTIDE  PREGNANCY, URINE   No results found. 1. Migraine   2. Bilateral lower extremity edema     MDM  Patient is a with a host of complaints, primarily she is concerned about her migraine headache as well as some bilateral lower extremity edema. Edema appears to be bilateral, she has no localized pain swelling or redness along the vein system, Wells score for DVT is 0.  Do not think this patient warrants any further investigation for venous thromboembolic disease. Likewise patient is not tachypneic,  she's not tachycardic, she relates some intermittent chest pains, some intermittent shortness of breath, however she's currently not short of breath and having no chest pain, she is PERC negative, do not suspect venous thromboembolic disease in this patient. Patient has had a history of low albumin in the past today is slightly low as well, in addition the patient has been using NSAIDs regularly-I suspect comminution of lower albumin and chronic NSAID use could be continued into some lower extremity edema - this could also be incompetent venous return. Patient's headache is significantly improved, we'll discharge the patient with some Compazine and Benadryl for headache management at home. She says she does have followup in [place] with her primary care provider. I told her to stop taking ibuprofen as it may be exacerbating her lower extremity edema. Do not suspect subarachnoid hemorrhage, meningitis, encephalitis or any other central nervous system infection.   Patient to followup as an outpatient for her chronic headaches as well as edema. Patient understands return precautions and agrees to medical plan as dictated.     Jones Skene, MD 07/27/12 [phone]

## 2012-07-26 NOTE — ED Notes (Signed)
Pt c/o swelling ankles/feet; increased x 1 wk; pain; migraine headache

## 2012-07-27 LAB — URINALYSIS, ROUTINE W REFLEX MICROSCOPIC
Bilirubin Urine: NEGATIVE
Hgb urine dipstick: NEGATIVE
Ketones, ur: NEGATIVE mg/dL
Nitrite: NEGATIVE
Protein, ur: NEGATIVE mg/dL
Urobilinogen, UA: 0.2 mg/dL (ref 0.0–1.0)

## 2012-07-27 LAB — HEPATIC FUNCTION PANEL
ALT: 9 U/L (ref 0–35)
Alkaline Phosphatase: 68 U/L (ref 39–117)
Total Bilirubin: 0.2 mg/dL — ABNORMAL LOW (ref 0.3–1.2)

## 2012-07-27 MED ORDER — PROCHLORPERAZINE MALEATE 10 MG PO TABS: 10.0000 mg | ORAL_TABLET | Freq: Two times a day (BID) | ORAL | Status: DC | PRN

## 2012-07-27 MED ORDER — DIPHENHYDRAMINE HCL 25 MG PO CAPS: 25.0000 mg | ORAL_CAPSULE | Freq: Four times a day (QID) | ORAL | Status: DC | PRN

## 2012-11-09 ENCOUNTER — Emergency Department (HOSPITAL_COMMUNITY)
Admission: EM | Admit: 2012-11-09 | Discharge: 2012-11-10 | Disposition: A | Discharge status: Home / Self Care | Payer: Medicaid Other | Attending: Emergency Medicine | Admitting: Emergency Medicine

## 2012-11-09 ENCOUNTER — Encounter (HOSPITAL_COMMUNITY): Payer: Self-pay | Admitting: Emergency Medicine

## 2012-11-09 ENCOUNTER — Emergency Department (HOSPITAL_COMMUNITY): Payer: Medicaid Other

## 2012-11-09 DIAGNOSIS — E669 Obesity, unspecified: Secondary | ICD-10-CM | POA: Insufficient documentation

## 2012-11-09 DIAGNOSIS — Z8614 Personal history of Methicillin resistant Staphylococcus aureus infection: Secondary | ICD-10-CM | POA: Insufficient documentation

## 2012-11-09 DIAGNOSIS — N730 Acute parametritis and pelvic cellulitis: Secondary | ICD-10-CM

## 2012-11-09 DIAGNOSIS — Z8719 Personal history of other diseases of the digestive system: Secondary | ICD-10-CM | POA: Insufficient documentation

## 2012-11-09 DIAGNOSIS — Z862 Personal history of diseases of the blood and blood-forming organs and certain disorders involving the immune mechanism: Secondary | ICD-10-CM | POA: Insufficient documentation

## 2012-11-09 DIAGNOSIS — Z8739 Personal history of other diseases of the musculoskeletal system and connective tissue: Secondary | ICD-10-CM | POA: Insufficient documentation

## 2012-11-09 DIAGNOSIS — R0789 Other chest pain: Secondary | ICD-10-CM | POA: Insufficient documentation

## 2012-11-09 DIAGNOSIS — Z3202 Encounter for pregnancy test, result negative: Secondary | ICD-10-CM | POA: Insufficient documentation

## 2012-11-09 DIAGNOSIS — N739 Female pelvic inflammatory disease, unspecified: Secondary | ICD-10-CM | POA: Insufficient documentation

## 2012-11-09 DIAGNOSIS — R112 Nausea with vomiting, unspecified: Secondary | ICD-10-CM | POA: Insufficient documentation

## 2012-11-09 DIAGNOSIS — J45909 Unspecified asthma, uncomplicated: Secondary | ICD-10-CM | POA: Insufficient documentation

## 2012-11-09 DIAGNOSIS — Z79899 Other long term (current) drug therapy: Secondary | ICD-10-CM | POA: Insufficient documentation

## 2012-11-09 DIAGNOSIS — G8929 Other chronic pain: Secondary | ICD-10-CM | POA: Insufficient documentation

## 2012-11-09 DIAGNOSIS — F17200 Nicotine dependence, unspecified, uncomplicated: Secondary | ICD-10-CM | POA: Insufficient documentation

## 2012-11-09 LAB — CBC WITH DIFFERENTIAL/PLATELET
Basophils Absolute: 0 10*3/uL (ref 0.0–0.1)
Basophils Relative: 0 % (ref 0–1)
Eosinophils Relative: 1 % (ref 0–5)
HCT: 36.1 % (ref 36.0–46.0)
Lymphocytes Relative: 24 % (ref 12–46)
MCHC: 32.7 g/dL (ref 30.0–36.0)
Monocytes Absolute: 0.8 10*3/uL (ref 0.1–1.0)
Neutro Abs: 6 10*3/uL (ref 1.7–7.7)
Platelets: 270 10*3/uL (ref 150–400)
RDW: 14.2 % (ref 11.5–15.5)
WBC: 9.1 10*3/uL (ref 4.0–10.5)

## 2012-11-09 LAB — URINE MICROSCOPIC-ADD ON

## 2012-11-09 LAB — WET PREP, GENITAL: Trich, Wet Prep: NONE SEEN

## 2012-11-09 LAB — COMPREHENSIVE METABOLIC PANEL
ALT: 6 U/L (ref 0–35)
AST: 12 U/L (ref 0–37)
Albumin: 3.4 g/dL — ABNORMAL LOW (ref 3.5–5.2)
CO2: 27 mEq/L (ref 19–32)
Calcium: 9.3 mg/dL (ref 8.4–10.5)
Chloride: 103 mEq/L (ref 96–112)
Creatinine, Ser: 0.7 mg/dL (ref 0.50–1.10)
Sodium: 139 mEq/L (ref 135–145)

## 2012-11-09 LAB — URINALYSIS, ROUTINE W REFLEX MICROSCOPIC
Bilirubin Urine: NEGATIVE
Glucose, UA: NEGATIVE mg/dL
Specific Gravity, Urine: 1.014 (ref 1.005–1.030)

## 2012-11-09 LAB — POCT PREGNANCY, URINE: Preg Test, Ur: NEGATIVE

## 2012-11-09 MED ORDER — TRAMADOL HCL 50 MG PO TABS: 50.0000 mg | ORAL_TABLET | Freq: Four times a day (QID) | ORAL | Status: DC | PRN

## 2012-11-09 MED ORDER — CEFTRIAXONE SODIUM 250 MG IJ SOLR
250.0000 mg | Freq: Once | INTRAMUSCULAR | Status: AC
  Administered 2012-11-09: 250 mg via INTRAMUSCULAR
  Filled 2012-11-09: qty 250

## 2012-11-09 MED ORDER — CEFTRIAXONE SODIUM 250 MG IJ SOLR: 250.0000 mg | Freq: Once | INTRAMUSCULAR | Status: DC

## 2012-11-09 MED ORDER — IOHEXOL 300 MG/ML  SOLN
20.0000 mL | INTRAMUSCULAR | Status: AC
  Administered 2012-11-09: 25 mL via ORAL

## 2012-11-09 MED ORDER — MORPHINE SULFATE 4 MG/ML IJ SOLN
4.0000 mg | Freq: Once | INTRAMUSCULAR | Status: AC
  Administered 2012-11-09: 4 mg via INTRAVENOUS
  Filled 2012-11-09: qty 1

## 2012-11-09 MED ORDER — ONDANSETRON HCL 4 MG PO TABS: 4.0000 mg | ORAL_TABLET | Freq: Three times a day (TID) | ORAL | Status: DC | PRN

## 2012-11-09 MED ORDER — IOHEXOL 300 MG/ML  SOLN
100.0000 mL | Freq: Once | INTRAMUSCULAR | Status: AC | PRN
  Administered 2012-11-09: 100 mL via INTRAVENOUS

## 2012-11-09 MED ORDER — DEXTROSE 5 % IV SOLN: 250.0000 mg | Freq: Once | INTRAVENOUS | Status: DC

## 2012-11-09 MED ORDER — SODIUM CHLORIDE 0.9 % IV SOLN
Freq: Once | INTRAVENOUS | Status: AC
  Administered 2012-11-09: 22:00:00 via INTRAVENOUS

## 2012-11-09 MED ORDER — LIDOCAINE HCL (PF) 1 % IJ SOLN
INTRAMUSCULAR | Status: AC
  Administered 2012-11-09: 2 mL
  Filled 2012-11-09: qty 5

## 2012-11-09 MED ORDER — DOXYCYCLINE HYCLATE 50 MG PO CAPS: 100.0000 mg | ORAL_CAPSULE | Freq: Two times a day (BID) | ORAL | Status: DC

## 2012-11-09 MED ORDER — SODIUM CHLORIDE 0.9 % IV BOLUS (SEPSIS)
1000.0000 mL | Freq: Once | INTRAVENOUS | Status: AC
  Administered 2012-11-09: 1000 mL via INTRAVENOUS

## 2012-11-09 MED ORDER — LIDOCAINE HCL (PF) 1 % IJ SOLN
2.0000 mL | Freq: Once | INTRAMUSCULAR | Status: AC
  Administered 2012-11-09: 2 mL

## 2012-11-09 MED ORDER — ONDANSETRON HCL 4 MG/2ML IJ SOLN
4.0000 mg | Freq: Once | INTRAMUSCULAR | Status: AC
  Administered 2012-11-09: 4 mg via INTRAVENOUS
  Filled 2012-11-09: qty 2

## 2012-11-09 MED ORDER — PROMETHAZINE HCL 12.5 MG RE SUPP: 12.5000 mg | Freq: Four times a day (QID) | RECTAL | Status: DC | PRN

## 2012-11-09 NOTE — ED Notes (Signed)
Pt presents with right lower quadrant pain that radiates to her back onset yesterday- pt also reports increase in urination.  Denies any vaginal bleeding or discharge.

## 2012-11-09 NOTE — ED Notes (Signed)
CT paged. 

## 2012-11-09 NOTE — ED Notes (Signed)
Unhooked pt from monitor to go to the restroom pt was able to ambulate without any assistance.

## 2012-11-09 NOTE — ED Notes (Signed)
Pt uncle - [phone]

## 2012-11-09 NOTE — ED Notes (Signed)
Patient transported to CT 

## 2012-11-09 NOTE — ED Provider Notes (Signed)
CSN: [id number]     Arrival date & time 11/09/12  1732 History   First MD Initiated Contact with Patient 11/09/12 1916     Chief Complaint  Patient presents with  . Abdominal Pain   (Consider location/radiation/quality/duration/timing/severity/associated sxs/prior Treatment) Patient is a 27 y.o. female presenting with abdominal pain.  Abdominal Pain Associated symptoms: chest pain (sharp central), nausea and vomiting   Associated symptoms: no constipation, no diarrhea, no dysuria, no fever, no shortness of breath and no vaginal discharge    Patient is a 27 yo female with history of asthma, cholelithiasis s/p cholecystectomy who presents with one day history of RLQ abdominal pain. States starting yesterday around noon she noted abdominal pain in her mid abdomen. As the day progressed it moved to her RLQ and right flank. She endorses nausea and vomiting x1. She denies dysuria, fevers, hematuria, vaginal discharge, changes in bowel movements.  She additionally notes central chest pain starting 2 days ago. Notes this is sharp. Came out of nowhere initially. Does not radiate. Is not exertional. Worse with cough and deep breath. Denies recent long trips, hemoptysis, swelling and pain in her calves, and history of cancer.  Past Medical History  Diagnosis Date  . Asthma   . Scoliosis   . Blood type, Rh negative   . Pregnant state, incidental   . Anemia   . Chronic back pain     scolosis- on percocet  . Hyperemesis complicating pregnancy, antepartum     Zofran PRN  . Headache(784.0)   . Cholelithiasis   . Pregnancy induced hypertension   . Hypothyroidism   . 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 Obesity    Past Surgical History  Procedure Laterality Date  . Cholecystectomy    . Adenoidectomy    . Dilation and curettage of uterus  2008   Family History  Problem Relation Age of Onset  . Heart disease Maternal Grandfather   . Diabetes Maternal Grandfather   . Anesthesia  problems Neg Hx    History  Substance Use Topics  . Smoking status: Current Every Day Smoker -- 0.50 packs/day    Types: Cigarettes    Last Attempt to Quit: 11/17/2010  . Smokeless tobacco: Never Used  . Alcohol Use: Yes   OB History   Grav Para Term Preterm Abortions TAB SAB Ect Mult Living   5 3 3  0 2 0 2 0 0 3     Review of Systems  Constitutional: Negative for fever.  Respiratory: Negative for chest tightness and shortness of breath.   Cardiovascular: Positive for chest pain (sharp central).  Gastrointestinal: Positive for nausea, vomiting and abdominal pain (right sided). Negative for diarrhea, constipation and blood in stool.  Genitourinary: Negative for dysuria and vaginal discharge.    Allergies  Review of patient's allergies indicates no known allergies.  Home Medications   Current Outpatient Rx  Name  Route  Sig  Dispense  Refill  . ALPRAZolam (XANAX) 0.5 MG tablet   Oral   Take 0.5 mg by mouth 3 (three) times daily as needed for anxiety.         . cyclobenzaprine (FLEXERIL) 10 MG tablet   Oral   Take 10 mg by mouth 2 (two) times daily as needed for muscle spasms.          BP 116/72  Pulse 96  Temp(Src) 98.7 F (37.1 C) (Oral)  Resp 18  SpO2 99%  LMP 10/03/2012 Physical Exam  Constitutional: She appears well-developed and well-nourished.  HENT:  Head: Normocephalic and atraumatic.  Mouth/Throat: Oropharynx is clear and moist.  Eyes: Pupils are equal, round, and reactive to light.  Neck: Neck supple.  Cardiovascular: Normal rate, regular rhythm and normal heart sounds.   Pulmonary/Chest: Effort normal and breath sounds normal. No respiratory distress. She has no wheezes. She has no rales. She exhibits tenderness (reproducible central costochondral joint tenderness).  Abdominal: Soft. She exhibits no distension. There is tenderness (RLQ and suprapubic and epigastric ). There is no rebound and no guarding.  Musculoskeletal: She exhibits no edema and  no tenderness.  No calf tenderness of cords  Lymphadenopathy:    She has no cervical adenopathy.  Neurological: She is alert.    ED Course  Procedures (including critical care time) Labs Review Labs Reviewed  CBC WITH DIFFERENTIAL - Abnormal; Notable for the following:    Hemoglobin 11.8 (*)    All other components within normal limits  COMPREHENSIVE METABOLIC PANEL - Abnormal; Notable for the following:    Albumin 3.4 (*)    All other components within normal limits  URINALYSIS, ROUTINE W REFLEX MICROSCOPIC - Abnormal; Notable for the following:    APPearance CLOUDY (*)    Hgb urine dipstick LARGE (*)    Protein, ur 30 (*)    Leukocytes, UA LARGE (*)    All other components within normal limits  URINE MICROSCOPIC-ADD ON - Abnormal; Notable for the following:    Squamous Epithelial / LPF FEW (*)    Bacteria, UA MANY (*)    All other components within normal limits  URINE CULTURE  GC/CHLAMYDIA PROBE AMP  WET PREP, GENITAL  POCT PREGNANCY, URINE   Imaging Review Ct Abdomen Pelvis W Contrast  11/09/2012   CLINICAL DATA:  Right-sided abdominal pain and back pain  EXAM: CT ABDOMEN AND PELVIS WITH CONTRAST  TECHNIQUE: Multidetector CT imaging of the abdomen and pelvis was performed using the standard protocol following bolus administration of intravenous contrast.  CONTRAST:  OMNIPAQUE IOHEXOL 300 MG/ML  SOLN  COMPARISON:  None.  FINDINGS: Lung bases are clear. No pericardial fluid.  There is a early enhancing lesion in the right hepatic lobe measuring 19 x 19 mm (image 18, series 2) may have a central scar. Similar 12 mm lesion left hepatic lobe (image 15). Patient status post cholecystectomy. There is mild intrahepatic and extrahepatic biliary duct dilatation. The common bile duct measures 9 mm (image 27). This is similar to comparison CT. The pancreas is normal without ductal dilatation. The spleen, and adrenal glands normal. There is a nonobstructing 3 mm calculus in the mid  right kidney. No ureterolithiasis.  Stomach, small bowel, appendix, cecum are normal. The colon and rectosigmoid colon are normal.  Abdominal aorta is normal caliber. No retroperitoneal periportal lymphadenopathy.  No free fluid the pelvis bladder normal. No pelvic lymphadenopathy.  Insert negative then.  IMPRESSION: 1. Nonobstructing and right renal calculus.  2. Two indeterminate enhancing lesions within the liver. These may represent a combination of focal nodular hyperplasia and a hemangioma but cannot be fully characterized. Consider MRI abdomen without and with contrast for further evaluation.  3. Mild biliary dilatation likely related to prior cholecystectomy.   Electronically Signed   By: Genevive Bi M.D.   On: 11/09/2012 21:05    EKG Interpretation   None       MDM   1. PID (acute pelvic inflammatory disease)   2. Chest pain, atypical    7:30 pm:  patient seen and examined. Patient with onset of RLQ and right mid abdominal pain with radiation to her right flank yesterday. U preg negative. LFTs negative. UA with large Hgb, negative nitrites, and large leukocytes. Concern given location of pain and lab findings is for appendicitis. Must also consider ovarian torsion, PID, UTI, Pyelo. Will do a pelvic exam on her and obtain GC/chlmaydia. Morphine given for pain. Zofran for nausea. Will give IVF with 1 L bolus followed by 150 mL/hr. Patients chest pain is reproducible with palpation of right costochondral joints. Given her age, quality of pain as atypical, reproducibility, and lack of cardiac risk factors makes cardiac cause unlikely at this time. PE unlikely given wells score of 0. Most likely represents a costochondritis given tender to palpation and age.  9:45 pm: CT scan of abdomen revealed no cause for current abdominal pain. Pelvic exam done that revealed mild cervical motion tenderness and right adnexal tenderness to palpation. No masses felt. Given uterine motion tenderness and  tenderness to palpation in right adnexa will treat as PID with ceftriaxone 250 mg now and send home with doxycycline 100 mg BID for 14 days. Patient sent home with zofran and phenergan for nausea, tramadol for pain. Discussed return precautions with the patient and she voiced understanding.  This patient was discussed and seen with my attending Dr Rhunette Croft.  Marikay Alar, MD [facility] PGY-2 11/09/12 12:00 am   Glori Luis, MD 11/10/12 [phone]

## 2012-11-09 NOTE — ED Notes (Signed)
Pelvic cart set set up and at bedside.

## 2012-11-09 NOTE — ED Notes (Addendum)
Pt reports that yesterday she started having right sided abd pain and flank pain. Reports a history of kidney stones. Reports increased urine. Also having some nausea but no vomiting. Reports LMP was in sept

## 2012-11-10 LAB — GC/CHLAMYDIA PROBE AMP
CT Probe RNA: NEGATIVE
GC Probe RNA: NEGATIVE

## 2012-11-11 LAB — URINE CULTURE

## 2012-11-12 NOTE — ED Provider Notes (Signed)
I performed a history and physical examination of  Jacqueline Clarke and discussed her management with Dr. Ladona Horns. I agree with the history, physical, assessment, and plan of care, with the following exceptions: None I was present for the following procedures: None  Time Spent in Critical Care of the patient: None  Time spent in discussions with the patient and family: 10 minutes  Pt with abd pain, CT is neg, Exam showed CMT, will treat as PID  Hulan Amato, MD 11/12/12 [phone]

## 2012-11-12 NOTE — Progress Notes (Signed)
ED Antimicrobial Stewardship Positive Culture Follow Up   Jacqueline Clarke is an 27 y.o. female who presented to [facility] on 11/09/2012 with a chief complaint of  Chief Complaint  Patient presents with  . Abdominal Pain    Recent Results (from the past 720 hour(s))  URINE CULTURE     Status: None   Collection Time    11/09/12  5:56 PM      Result Value Range Status   Specimen Description URINE, CLEAN CATCH   Final   Special Requests NONE   Final   Culture  Setup Time     Final   Value: 11/09/2012 20:50     Performed at [facility] Count     Final   Value: >=100,000 COLONIES/ML     Performed at [facility]   Culture     Final   Value: ESCHERICHIA COLI     Performed at [facility]   Report Status 11/11/2012 FINAL   Final   Organism ID, Bacteria ESCHERICHIA COLI   Final  GC/CHLAMYDIA PROBE AMP     Status: None   Collection Time    11/09/12  9:57 PM      Result Value Range Status   CT Probe RNA NEGATIVE  NEGATIVE Final   GC Probe RNA NEGATIVE  NEGATIVE Final   Comment: (NOTE)                                                                                              **Normal Reference Range: Negative**          Assay performed using the Gen-Probe APTIMA COMBO2 (R) Assay.     Acceptable specimen types for this assay include APTIMA Swabs (Unisex,     endocervical, urethral, or vaginal), first void urine, and ThinPrep     liquid based cytology samples.     Performed at [facility]  WET PREP, GENITAL     Status: Abnormal   Collection Time    11/09/12  9:57 PM      Result Value Range Status   Yeast Wet Prep HPF POC NONE SEEN  NONE SEEN Final   Trich, Wet Prep NONE SEEN  NONE SEEN Final   Clue Cells Wet Prep HPF POC FEW (*) NONE SEEN Final   WBC, Wet Prep HPF POC MODERATE (*) NONE SEEN Final      []  Patient discharged on doxycline, which it doesn't cover  New antibiotic prescription: Septra DS 1 tablet BID x 3  days  ED Provider: Raymon Mutton, PA   [name] 11/12/2012, 9:22 AM Infectious Diseases Pharmacist Phone# [phone]

## 2012-11-13 ENCOUNTER — Telehealth (HOSPITAL_COMMUNITY): Payer: Self-pay

## 2012-11-13 NOTE — ED Notes (Signed)
Post ED Visit - Positive Culture Follow-up: Successful Patient Follow-Up  Culture assessed and recommendations reviewed by: []  Wes Dulaney, Pharm.D., BCPS []  Celedonio Miyamoto, Pharm.D., BCPS []  Georgina Pillion, Pharm.D., BCPS [x]  Merrill, [address].D., BCPS, AAHIVP []  Estella Husk, Pharm.D., BCPS, AAHIVP  Positive Urine culture  [x]  Patient discharged without antimicrobial prescription and treatment is now indicated.  Discharged w/Rx for Doxycycline for PID.  Needs tx for (+) Urine Cx []  Organism is resistant to prescribed ED discharge antimicrobial []  Patient with positive blood cultures  Changes discussed with ED provider: Raymon Mutton PA  New antibiotic prescription "Septra DS 1 po BID x 3 days" Called to   Contacted patient, date 11/13/12, time 11:30 [employer] customer unavailable.   Arvid Right 11/13/2012, 11:32 AM

## 2012-11-20 ENCOUNTER — Emergency Department (HOSPITAL_COMMUNITY)
Admission: EM | Admit: 2012-11-20 | Discharge: 2012-11-20 | Disposition: A | Discharge status: Home / Self Care | Payer: Medicaid Other | Attending: Emergency Medicine | Admitting: Emergency Medicine

## 2012-11-20 ENCOUNTER — Encounter (HOSPITAL_COMMUNITY): Payer: Self-pay | Admitting: Emergency Medicine

## 2012-11-20 ENCOUNTER — Emergency Department (HOSPITAL_COMMUNITY): Payer: Medicaid Other

## 2012-11-20 DIAGNOSIS — E669 Obesity, unspecified: Secondary | ICD-10-CM | POA: Insufficient documentation

## 2012-11-20 DIAGNOSIS — B9789 Other viral agents as the cause of diseases classified elsewhere: Secondary | ICD-10-CM | POA: Insufficient documentation

## 2012-11-20 DIAGNOSIS — M4120 Other idiopathic scoliosis, site unspecified: Secondary | ICD-10-CM | POA: Insufficient documentation

## 2012-11-20 DIAGNOSIS — K029 Dental caries, unspecified: Secondary | ICD-10-CM | POA: Insufficient documentation

## 2012-11-20 DIAGNOSIS — R599 Enlarged lymph nodes, unspecified: Secondary | ICD-10-CM | POA: Insufficient documentation

## 2012-11-20 DIAGNOSIS — B349 Viral infection, unspecified: Secondary | ICD-10-CM

## 2012-11-20 DIAGNOSIS — J45909 Unspecified asthma, uncomplicated: Secondary | ICD-10-CM | POA: Insufficient documentation

## 2012-11-20 DIAGNOSIS — Z79899 Other long term (current) drug therapy: Secondary | ICD-10-CM | POA: Insufficient documentation

## 2012-11-20 DIAGNOSIS — Z8639 Personal history of other endocrine, nutritional and metabolic disease: Secondary | ICD-10-CM | POA: Insufficient documentation

## 2012-11-20 DIAGNOSIS — F17200 Nicotine dependence, unspecified, uncomplicated: Secondary | ICD-10-CM | POA: Insufficient documentation

## 2012-11-20 DIAGNOSIS — Z862 Personal history of diseases of the blood and blood-forming organs and certain disorders involving the immune mechanism: Secondary | ICD-10-CM | POA: Insufficient documentation

## 2012-11-20 DIAGNOSIS — M549 Dorsalgia, unspecified: Secondary | ICD-10-CM | POA: Insufficient documentation

## 2012-11-20 DIAGNOSIS — Z8719 Personal history of other diseases of the digestive system: Secondary | ICD-10-CM | POA: Insufficient documentation

## 2012-11-20 DIAGNOSIS — R110 Nausea: Secondary | ICD-10-CM | POA: Insufficient documentation

## 2012-11-20 DIAGNOSIS — R0602 Shortness of breath: Secondary | ICD-10-CM | POA: Insufficient documentation

## 2012-11-20 DIAGNOSIS — G8929 Other chronic pain: Secondary | ICD-10-CM | POA: Insufficient documentation

## 2012-11-20 DIAGNOSIS — Z8614 Personal history of Methicillin resistant Staphylococcus aureus infection: Secondary | ICD-10-CM | POA: Insufficient documentation

## 2012-11-20 DIAGNOSIS — R000 Tachycardia, unspecified: Secondary | ICD-10-CM | POA: Insufficient documentation

## 2012-11-20 MED ORDER — ONDANSETRON 4 MG PO TBDP
4.0000 mg | ORAL_TABLET | Freq: Once | ORAL | Status: AC
Start: 2012-11-20 — End: 2012-11-20
  Administered 2012-11-20: 4 mg via ORAL
  Filled 2012-11-20: qty 1

## 2012-11-20 MED ORDER — ONDANSETRON HCL 4 MG PO TABS: 4.0000 mg | ORAL_TABLET | Freq: Four times a day (QID) | ORAL | Status: DC

## 2012-11-20 NOTE — ED Provider Notes (Signed)
CSN: [id number]     Arrival date & time 11/20/12  1834 History   First MD Initiated Contact with Patient 11/20/12 1910     Chief Complaint  Patient presents with  . Shortness of Breath  . Nausea   (Consider location/radiation/quality/duration/timing/severity/associated sxs/prior Treatment) HPI Comments: The patient is a 27 year old female with a past medical history of obesity presenting to the ED with sore throat, fever, and chest wall tightness.  She reports throat pain increases with swallowing. She reports a fever of 104 yesterday, last tylenol 1800 today.  She reports EMS stated she had wheezing and gave her two nebs on the way over. Reports a remote history of childhood asthma and has not used a home inhaler for greater than 4 years. Reports multiple Children in the house hold are "sick" with and unknown illness of coughing and sore throat. She reports nausea and lack of appetite for three days.  Denies vomiting, ear pain, diarrhea, abdominal pain.  Patient is a 27 y.o. female presenting with shortness of breath. The history is provided by the patient.  Shortness of Breath   Past Medical History  Diagnosis Date  . Asthma   . Scoliosis   . Blood type, Rh negative   . Pregnant state, incidental   . Anemia   . Chronic back pain     scolosis- on percocet  . Hyperemesis complicating pregnancy, antepartum     Zofran PRN  . Headache(784.0)   . Cholelithiasis   . Pregnancy induced hypertension   . Hypothyroidism   . 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 Obesity    Past Surgical History  Procedure Laterality Date  . Cholecystectomy    . Adenoidectomy    . Dilation and curettage of uterus  2008   Family History  Problem Relation Age of Onset  . Heart disease Maternal Grandfather   . Diabetes Maternal Grandfather   . Anesthesia problems Neg Hx    History  Substance Use Topics  . Smoking status: Current Every Day Smoker -- 0.50 packs/day    Types: Cigarettes   Last Attempt to Quit: 11/17/2010  . Smokeless tobacco: Never Used  . Alcohol Use: Yes   OB History   Grav Para Term Preterm Abortions TAB SAB Ect Mult Living   5 3 3  0 2 0 2 0 0 3     Review of Systems  All other systems reviewed and are negative.    Allergies  Review of patient's allergies indicates no known allergies.  Home Medications   Current Outpatient Rx  Name  Route  Sig  Dispense  Refill  . ALPRAZolam (XANAX) 0.5 MG tablet   Oral   Take 0.5 mg by mouth 3 (three) times daily as needed for anxiety.         . cyclobenzaprine (FLEXERIL) 10 MG tablet   Oral   Take 10 mg by mouth 2 (two) times daily as needed for muscle spasms.         Marland Kitchen etonogestrel (IMPLANON) 68 MG IMPL implant   Subcutaneous   Inject 1 each into the skin once.         . ondansetron (ZOFRAN) 4 MG tablet   Oral   Take 1 tablet (4 mg total) by mouth every 8 (eight) hours as needed for nausea.   20 tablet   0   . ondansetron (ZOFRAN) 4 MG tablet   Oral   Take 1 tablet (4  mg total) by mouth every 6 (six) hours.   12 tablet   0   . EXPIRED: promethazine (PHENERGAN) 25 MG tablet   Oral   Take 1 tablet (25 mg total) by mouth every 6 (six) hours as needed for nausea.   20 tablet   0   . EXPIRED: promethazine (PHENERGAN) 25 MG tablet   Oral   Take 1 tablet (25 mg total) by mouth every 6 (six) hours as needed for nausea.   30 tablet   0    BP 107/68  Pulse 124  Temp(Src) 99.9 F (37.7 C) (Oral)  Resp 20  SpO2 99%  LMP 08/25/2012  Breastfeeding? No Physical Exam  Nursing note and vitals reviewed. Constitutional: She is oriented to person, place, and time. She appears well-developed and well-nourished. No distress.  HENT:  Head: Normocephalic and atraumatic.  Nose: Rhinorrhea present.  Mouth/Throat: Uvula is midline. No trismus in the jaw. Dental caries present. Posterior oropharyngeal edema and posterior oropharyngeal erythema present.  Eyes: EOM are normal. Right eye exhibits  no discharge.  Neck: Neck supple.  Cardiovascular: Regular rhythm.  Tachycardia present.   Pulmonary/Chest: Effort normal. No respiratory distress. She has no wheezes. She has no rales.  Abdominal: There is no tenderness. There is no rebound.  Lymphadenopathy:       Head (right side): Tonsillar adenopathy present.       Head (left side): Tonsillar adenopathy present.    She has cervical adenopathy.       Right cervical: Superficial cervical adenopathy present. No deep cervical and no posterior cervical adenopathy present.      Left cervical: Superficial cervical adenopathy present. No deep cervical and no posterior cervical adenopathy present.  Neurological: She is alert and oriented to person, place, and time.  Skin: Skin is warm and dry. No rash noted.    ED Course  Procedures (including critical care time) Labs Review Labs Reviewed  RAPID STREP SCREEN  CULTURE, GROUP A STREP   Imaging Review     DG Chest 2 View (Final result)  Result time: 11/20/12 19:50:27    Final result by Rad Results In Interface (11/20/12 19:50:27)    Narrative:   CLINICAL DATA: Shortness of breath. Fever. Nausea.  EXAM: CHEST 2 VIEW  COMPARISON: 05/17/2012  FINDINGS: The heart size and mediastinal contours are within normal limits. Both lungs are clear. The visualized skeletal structures are unremarkable.  IMPRESSION: No active cardiopulmonary disease.   Electronically Signed By: Myles Rosenthal M.D. On: 11/20/2012 19:50     EKG Interpretation     Ventricular Rate:  113 PR Interval:  119 QRS Duration: 95 QT Interval:  312 QTC Calculation: 428 R Axis:   70 Text Interpretation:  Sinus tachycardia Borderline repolarization abnormality ED PHYSICIAN INTERPRETATION AVAILABLE IN [facility]            MDM   1. Viral illness   2. Nausea     Patient with viral like illness.  No wheezing on exam but with chest tightness will Chest XR to evaluate fever source.  Rapid strep  performed due to fever and sore throat history. The patient had some posttussive emesis in the room after the Rapid strep was preformed.   XR negative, Rapid strep negative.  Will discharge patient with antinausea medication.  Patient with a viral illness. Will advise patient to drink plenty of fluids.    Meds given in ED:  Medications  ondansetron (ZOFRAN-ODT) disintegrating tablet 4 mg (4 mg Oral Given  11/20/12 2039)    Discharge Medication List as of 11/20/2012  8:32 PM    START taking these medications   Details  !! ondansetron (ZOFRAN) 4 MG tablet Take 1 tablet (4 mg total) by mouth every 6 (six) hours., Starting 11/20/2012, Until Discontinued, Print     !! - Potential duplicate medications found. Please discuss with provider.        Clabe Seal, PA-C 11/22/12 2006

## 2012-11-20 NOTE — ED Notes (Signed)
Pt from home reports that she has SOB, nausea, sore throat, abd pain, lower back pain x3 days. Pt received 1st albuterol tx, 2nd tx was 5/5 duo neb. Pt A&O and in NAD

## 2012-11-20 NOTE — ED Notes (Signed)
Pt from home via EMS reports that she has had generalized body aches, sore throat, SOB, fever, nausea x3 days. Pt has received 2 nebs en route. Pt took Tylenol approx 6:00pm today.  Pt is A&O and in NAD

## 2012-11-23 ENCOUNTER — Encounter (HOSPITAL_COMMUNITY): Payer: Self-pay | Admitting: Emergency Medicine

## 2012-11-23 ENCOUNTER — Emergency Department (HOSPITAL_COMMUNITY)
Admission: EM | Admit: 2012-11-23 | Discharge: 2012-11-23 | Disposition: A | Discharge status: Home / Self Care | Payer: Medicaid Other | Attending: Emergency Medicine | Admitting: Emergency Medicine

## 2012-11-23 DIAGNOSIS — Z8739 Personal history of other diseases of the musculoskeletal system and connective tissue: Secondary | ICD-10-CM | POA: Insufficient documentation

## 2012-11-23 DIAGNOSIS — Z862 Personal history of diseases of the blood and blood-forming organs and certain disorders involving the immune mechanism: Secondary | ICD-10-CM | POA: Insufficient documentation

## 2012-11-23 DIAGNOSIS — Z8614 Personal history of Methicillin resistant Staphylococcus aureus infection: Secondary | ICD-10-CM | POA: Insufficient documentation

## 2012-11-23 DIAGNOSIS — Z8719 Personal history of other diseases of the digestive system: Secondary | ICD-10-CM | POA: Insufficient documentation

## 2012-11-23 DIAGNOSIS — J029 Acute pharyngitis, unspecified: Secondary | ICD-10-CM | POA: Insufficient documentation

## 2012-11-23 DIAGNOSIS — J069 Acute upper respiratory infection, unspecified: Secondary | ICD-10-CM | POA: Insufficient documentation

## 2012-11-23 DIAGNOSIS — N898 Other specified noninflammatory disorders of vagina: Secondary | ICD-10-CM | POA: Insufficient documentation

## 2012-11-23 DIAGNOSIS — Z3202 Encounter for pregnancy test, result negative: Secondary | ICD-10-CM | POA: Insufficient documentation

## 2012-11-23 DIAGNOSIS — J45909 Unspecified asthma, uncomplicated: Secondary | ICD-10-CM | POA: Insufficient documentation

## 2012-11-23 DIAGNOSIS — Z79899 Other long term (current) drug therapy: Secondary | ICD-10-CM | POA: Insufficient documentation

## 2012-11-23 DIAGNOSIS — F17200 Nicotine dependence, unspecified, uncomplicated: Secondary | ICD-10-CM | POA: Insufficient documentation

## 2012-11-23 DIAGNOSIS — R000 Tachycardia, unspecified: Secondary | ICD-10-CM | POA: Insufficient documentation

## 2012-11-23 DIAGNOSIS — E669 Obesity, unspecified: Secondary | ICD-10-CM | POA: Insufficient documentation

## 2012-11-23 LAB — CULTURE, GROUP A STREP

## 2012-11-23 MED ORDER — PSEUDOEPHEDRINE HCL ER 120 MG PO TB12
120.0000 mg | ORAL_TABLET | ORAL | Status: AC
  Administered 2012-11-23: 120 mg via ORAL
  Filled 2012-11-23: qty 1

## 2012-11-23 MED ORDER — PSEUDOEPHEDRINE HCL ER 120 MG PO TB12: 120.0000 mg | ORAL_TABLET | Freq: Two times a day (BID) | ORAL | Status: DC

## 2012-11-23 NOTE — ED Notes (Signed)
Pt upset at discharge.  Refused to allow this RN to finish discharge vital signs. Only vital signs obtained was temp 99.1.  "I am in extreme pain and all y'all are giving me is medicine for my cough.  Y'all didn't even do any tests on me.  I am just gonna go to [facility]."  This RN able to obtain e-signature at discharge.

## 2012-11-23 NOTE — ED Notes (Addendum)
Pt. reports headache and sore throat for several days , seen at [facility] Sunday strep screen is negative . Respirations unlabored / airway intact. Diagnosed with viral illness .

## 2012-11-23 NOTE — ED Provider Notes (Signed)
CSN: [id number]     Arrival date & time 11/23/12  2045 History      Chief Complaint  Patient presents with  . Headache  . Sore Throat   HPI Comments: 4 dasy ago seen and Dx with viral pharyngitis since has developed nasal congestion  Also reports that she has not had a menstrual cycle since August but started bleeding 2 days ago and it is heavier than normal she is concerned she may be having a miscarriage   Patient is a 27 y.o. female presenting with headaches and pharyngitis. The history is provided by the patient. No language interpreter was used.  Headache Pain location:  Generalized Quality:  Dull Radiates to:  Does not radiate Severity currently:  5/10 Severity at highest:  9/10 Onset quality:  Gradual Duration:  4 days Timing:  Constant Progression:  Unchanged Chronicity:  New Similar to prior headaches: yes   Context comment:  Nasal congestion Relieved by:  Nothing Worsened by:  Nothing tried Associated symptoms: congestion, cough and sore throat   Associated symptoms: no dizziness, no drainage, no facial pain, no fever, no nausea and no photophobia   Congestion:    Location:  Nasal   Interferes with sleep: yes     Interferes with eating/drinking: yes   Sore throat:    Severity:  Mild   Onset quality:  Gradual   Duration:  4 days   Timing:  Constant   Progression:  Unchanged Sore Throat Associated symptoms include headaches.  Sore Throat Associated symptoms include congestion, coughing, headaches and a sore throat. Pertinent negatives include no fever or nausea.    Past Medical History  Diagnosis Date  . Asthma   . Scoliosis   . Blood type, Rh negative   . Pregnant state, incidental   . Anemia   . Chronic back pain     scolosis- on percocet  . Hyperemesis complicating pregnancy, antepartum     Zofran PRN  . Headache(784.0)   . Cholelithiasis   . Pregnancy induced hypertension   . Hypothyroidism   . 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  Obesity    Past Surgical History  Procedure Laterality Date  . Cholecystectomy    . Adenoidectomy    . Dilation and curettage of uterus  2008   Family History  Problem Relation Age of Onset  . Heart disease Maternal Grandfather   . Diabetes Maternal Grandfather   . Anesthesia problems Neg Hx    History  Substance Use Topics  . Smoking status: Current Every Day Smoker -- 0.50 packs/day    Types: Cigarettes    Last Attempt to Quit: 11/17/2010  . Smokeless tobacco: Never Used  . Alcohol Use: Yes   OB History   Grav Para Term Preterm Abortions TAB SAB Ect Mult Living   5 3 3  0 2 0 2 0 0 3     Review of Systems  Constitutional: Positive for appetite change. Negative for fever and activity change.  HENT: Positive for congestion, rhinorrhea and sore throat. Negative for facial swelling and postnasal drip.   Eyes: Negative for photophobia.  Respiratory: Positive for cough.   Gastrointestinal: Negative for nausea and anal bleeding.  Genitourinary: Positive for vaginal bleeding.  Neurological: Positive for headaches. Negative for dizziness.  All other systems reviewed and are negative.    Allergies  Review of patient's allergies indicates no known allergies.  Home Medications   Current Outpatient Rx  Name  Route  Sig  Dispense  Refill  . ALPRAZolam (XANAX) 0.5 MG tablet   Oral   Take 0.5 mg by mouth 3 (three) times daily as needed for anxiety.         . cyclobenzaprine (FLEXERIL) 10 MG tablet   Oral   Take 10 mg by mouth 2 (two) times daily as needed for muscle spasms.         Marland Kitchen etonogestrel (IMPLANON) 68 MG IMPL implant   Subcutaneous   Inject 1 each into the skin once.         Marland Kitchen EXPIRED: promethazine (PHENERGAN) 25 MG tablet   Oral   Take 1 tablet (25 mg total) by mouth every 6 (six) hours as needed for nausea.   20 tablet   0   . EXPIRED: promethazine (PHENERGAN) 25 MG tablet   Oral   Take 1 tablet (25 mg total) by mouth every 6 (six) hours as needed  for nausea.   30 tablet   0   . pseudoephedrine (SUDAFED) 120 MG 12 hr tablet   Oral   Take 1 tablet (120 mg total) by mouth 2 (two) times daily.   30 tablet   0    BP 108/94  Pulse 108  Temp(Src) 97.9 F (36.6 C) (Oral)  Resp 18  SpO2 99%  LMP 08/25/2012 Physical Exam  Nursing note and vitals reviewed. Constitutional: She appears well-nourished.  HENT:  Head: Normocephalic.  Mouth/Throat: Oropharynx is clear and moist.  Eyes: Pupils are equal, round, and reactive to light.  Bilateral injection   Cardiovascular: Regular rhythm.  Tachycardia present.   Pulmonary/Chest: Effort normal.  Musculoskeletal: Normal range of motion.  Lymphadenopathy:    She has no cervical adenopathy.  Neurological: She is alert.  Skin: Skin is warm and dry.    ED Course  Procedures (including critical care time) DIAGNOSTIC STUDIES:   COORDINATION OF CARE:    11:29 PM- Pt advised of plan for treatment and pt agrees.   Labs Review Labs Reviewed  POCT PREGNANCY, URINE   Imaging Review No results found.  EKG Interpretation   None       MDM   1. URI (upper respiratory infection)    Will Rx Sudafed for congestion check U Preg      Arman Filter, NP 11/23/12 2329   Medical screening examination/treatment/procedure(s) were performed by non-physician practitioner and as supervising physician I was immediately available for consultation/collaboration.  EKG Interpretation   None        Derwood Kaplan, MD 11/30/12 [phone]

## 2012-11-25 ENCOUNTER — Emergency Department (HOSPITAL_COMMUNITY)
Admission: EM | Admit: 2012-11-25 | Discharge: 2012-11-26 | Disposition: A | Discharge status: Home / Self Care | Payer: Medicaid Other | Attending: Emergency Medicine | Admitting: Emergency Medicine

## 2012-11-25 ENCOUNTER — Encounter (HOSPITAL_COMMUNITY): Payer: Self-pay | Admitting: Emergency Medicine

## 2012-11-25 DIAGNOSIS — H1030 Unspecified acute conjunctivitis, unspecified eye: Secondary | ICD-10-CM | POA: Insufficient documentation

## 2012-11-25 DIAGNOSIS — H1033 Unspecified acute conjunctivitis, bilateral: Secondary | ICD-10-CM

## 2012-11-25 DIAGNOSIS — F17200 Nicotine dependence, unspecified, uncomplicated: Secondary | ICD-10-CM | POA: Insufficient documentation

## 2012-11-25 DIAGNOSIS — M549 Dorsalgia, unspecified: Secondary | ICD-10-CM | POA: Insufficient documentation

## 2012-11-25 DIAGNOSIS — R509 Fever, unspecified: Secondary | ICD-10-CM | POA: Insufficient documentation

## 2012-11-25 DIAGNOSIS — Z862 Personal history of diseases of the blood and blood-forming organs and certain disorders involving the immune mechanism: Secondary | ICD-10-CM | POA: Insufficient documentation

## 2012-11-25 DIAGNOSIS — R0989 Other specified symptoms and signs involving the circulatory and respiratory systems: Secondary | ICD-10-CM | POA: Insufficient documentation

## 2012-11-25 DIAGNOSIS — J45909 Unspecified asthma, uncomplicated: Secondary | ICD-10-CM | POA: Insufficient documentation

## 2012-11-25 DIAGNOSIS — Z8739 Personal history of other diseases of the musculoskeletal system and connective tissue: Secondary | ICD-10-CM | POA: Insufficient documentation

## 2012-11-25 DIAGNOSIS — G8929 Other chronic pain: Secondary | ICD-10-CM | POA: Insufficient documentation

## 2012-11-25 DIAGNOSIS — Z8639 Personal history of other endocrine, nutritional and metabolic disease: Secondary | ICD-10-CM | POA: Insufficient documentation

## 2012-11-25 DIAGNOSIS — J3489 Other specified disorders of nose and nasal sinuses: Secondary | ICD-10-CM | POA: Insufficient documentation

## 2012-11-25 MED ORDER — FLUORESCEIN SODIUM 1 MG OP STRP
1.0000 | ORAL_STRIP | Freq: Once | OPHTHALMIC | Status: AC
  Administered 2012-11-26: 1 via OPHTHALMIC
  Filled 2012-11-25: qty 1

## 2012-11-25 MED ORDER — TETRACAINE HCL 0.5 % OP SOLN
2.0000 [drp] | Freq: Once | OPHTHALMIC | Status: AC
  Administered 2012-11-26: 2 [drp] via OPHTHALMIC
  Filled 2012-11-25: qty 2

## 2012-11-25 NOTE — ED Provider Notes (Signed)
Medical screening examination/treatment/procedure(s) were performed by non-physician practitioner and as supervising physician I was immediately available for consultation/collaboration.  EKG Interpretation     Ventricular Rate:  113 PR Interval:  119 QRS Duration: 95 QT Interval:  312 QTC Calculation: 428 R Axis:   70 Text Interpretation:  Sinus tachycardia Borderline repolarization abnormality ED PHYSICIAN INTERPRETATION AVAILABLE IN [facility] Tacey Ruiz, MD 11/25/12 [phone]

## 2012-11-25 NOTE — ED Notes (Signed)
Patient here 4 days ago for URI, now with right eye pain, light makes her eye hurt.

## 2012-11-25 NOTE — ED Provider Notes (Signed)
CSN: [id number]     Arrival date & time 11/25/12  2327 History   First MD Initiated Contact with Patient 11/25/12 2350     Chief Complaint  Patient presents with  . Eye Pain   (Consider location/radiation/quality/duration/timing/severity/associated sxs/prior Treatment) The history is provided by the patient and medical records. No language interpreter was used.    Jacqueline Clarke is a 27 y.o. female  with a hx of asthma presents to the Emergency Department complaining of gradual, persistent, progressively worsening right eye irritation onset 2 days ago, beginning with itching and progressing to pain.  Pt reports pain is on the exterior of the eye and denies deep globe pain.  Pt reports she was seen here for URI several days ago and attempted to discuss this with the provider, but the provider refused to assess her eye.  She reports she wears disposable contacts, but has been wearing the same pair for 2-3 months because she is out of the others.  She reports associated burning in her eye and the pain in her eye is causing her head to hurt. She reports a small amount of purulent drainage for about 1 week, worse in the mornings, but significantly increased in the last 2 days with matting of the eye.  Pt reports low grade fever at 99-100 at home without increasing, but also reports that she has a cold right now. She denies neck pain, chest pain, nausea, vomiting.  She endorses difficulty seeing because of eye discharge.  She reports her URI symptoms are slowly improving.  Past Medical History  Diagnosis Date  . Asthma   . Scoliosis   . Blood type, Rh negative   . Pregnant state, incidental   . Anemia   . Chronic back pain     scolosis- on percocet  . Hyperemesis complicating pregnancy, antepartum     Zofran PRN  . Headache(784.0)   . Cholelithiasis   . Pregnancy induced hypertension   . Hypothyroidism   . 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 Obesity    Past Surgical  History  Procedure Laterality Date  . Cholecystectomy    . Adenoidectomy    . Dilation and curettage of uterus  2008   Family History  Problem Relation Age of Onset  . Heart disease Maternal Grandfather   . Diabetes Maternal Grandfather   . Anesthesia problems Neg Hx    History  Substance Use Topics  . Smoking status: Current Every Day Smoker -- 0.50 packs/day    Types: Cigarettes    Last Attempt to Quit: 11/17/2010  . Smokeless tobacco: Never Used  . Alcohol Use: Yes   OB History   Grav Para Term Preterm Abortions TAB SAB Ect Mult Living   5 3 3  0 2 0 2 0 0 3     Review of Systems  Constitutional: Positive for fever and diaphoresis. Negative for chills, appetite change and fatigue.  HENT: Positive for congestion, postnasal drip, rhinorrhea, sinus pressure and sore throat. Negative for ear discharge, ear pain and mouth sores.   Eyes: Positive for photophobia, pain, discharge, redness and itching. Negative for visual disturbance.  Respiratory: Positive for cough. Negative for chest tightness, shortness of breath, wheezing and stridor.   Cardiovascular: Negative for chest pain, palpitations and leg swelling.  Gastrointestinal: Negative for nausea, vomiting, abdominal pain and diarrhea.  Genitourinary: Negative for dysuria, urgency, frequency and hematuria.  Musculoskeletal: Negative for arthralgias, back pain, myalgias and neck stiffness.  Skin: Negative for rash.  Neurological: Positive for headaches. Negative for syncope, light-headedness and numbness.  Hematological: Negative for adenopathy.  Psychiatric/Behavioral: The patient is not nervous/anxious.   All other systems reviewed and are negative.    Allergies  Review of patient's allergies indicates no known allergies.  Home Medications   Current Outpatient Rx  Name  Route  Sig  Dispense  Refill  . ALPRAZolam (XANAX) 0.5 MG tablet   Oral   Take 0.5 mg by mouth 3 (three) times daily as needed for anxiety.          . cyclobenzaprine (FLEXERIL) 10 MG tablet   Oral   Take 10 mg by mouth 2 (two) times daily as needed for muscle spasms.         Marland Kitchen etonogestrel (IMPLANON) 68 MG IMPL implant   Subcutaneous   Inject 1 each into the skin once.         . pseudoephedrine (SUDAFED) 120 MG 12 hr tablet   Oral   Take 1 tablet (120 mg total) by mouth 2 (two) times daily.   30 tablet   0   . ciprofloxacin (CILOXAN) 0.3 % ophthalmic solution   Both Eyes   Place 1 drop into both eyes every 2 (two) hours. Administer 1 drop, every 2 hours, while awake, for 2 days. Then 1 drop, every 4 hours, while awake, for the next 5 days.   5 mL   0   . HYDROcodone-acetaminophen (NORCO/VICODIN) 5-325 MG per tablet   Oral   Take 1 tablet by mouth every 4 (four) hours as needed.   6 tablet   0    BP 101/73  Pulse 97  Temp(Src) 99.1 F (37.3 C) (Oral)  SpO2 100%  LMP 08/25/2012 Physical Exam  Nursing note and vitals reviewed. Constitutional: She is oriented to person, place, and time. She appears well-developed and well-nourished. No distress.  Awake, alert, nontoxic appearance  HENT:  Head: Normocephalic and atraumatic.  Right Ear: Tympanic membrane, external ear and ear canal normal.  Left Ear: Tympanic membrane, external ear and ear canal normal.  Nose: Mucosal edema and rhinorrhea present. No epistaxis. Right sinus exhibits no maxillary sinus tenderness and no frontal sinus tenderness. Left sinus exhibits no maxillary sinus tenderness and no frontal sinus tenderness.  Mouth/Throat: Uvula is midline, oropharynx is clear and moist and mucous membranes are normal. Mucous membranes are not pale and not cyanotic. No oropharyngeal exudate, posterior oropharyngeal edema, posterior oropharyngeal erythema or tonsillar abscesses.  Eyes: EOM are normal. Pupils are equal, round, and reactive to light. Lids are everted and swept, no foreign bodies found. Right eye exhibits chemosis and discharge. Right eye exhibits no  exudate and no hordeolum. No foreign body present in the right eye. Left eye exhibits discharge. Left eye exhibits no chemosis, no exudate and no hordeolum. No foreign body present in the left eye. Right conjunctiva is injected. Right conjunctiva has no hemorrhage. Left conjunctiva is injected. Left conjunctiva has no hemorrhage. No scleral icterus.  Slit lamp exam:      The right eye shows no corneal abrasion, no corneal flare, no corneal ulcer, no foreign body, no fluorescein uptake and no anterior chamber bulge.       The left eye shows no corneal abrasion, no corneal flare, no corneal ulcer, no foreign body, no fluorescein uptake and no anterior chamber bulge.  Neck: Normal range of motion and full passive range of motion without pain. Neck supple.  Cardiovascular: Normal rate, regular  rhythm and intact distal pulses.   Pulmonary/Chest: Effort normal. No stridor. No respiratory distress. She has no decreased breath sounds. She has no wheezes. She has rhonchi. She has no rales.  Scattered rhonchi throughout  Abdominal: Soft. Bowel sounds are normal. She exhibits no mass. There is no tenderness. There is no rebound and no guarding.  Musculoskeletal: Normal range of motion. She exhibits no edema.  Lymphadenopathy:    She has no cervical adenopathy.  Neurological: She is alert and oriented to person, place, and time.  Speech is clear and goal oriented Moves extremities without ataxia  Skin: Skin is warm and dry. No rash noted. She is not diaphoretic.  Psychiatric: She has a normal mood and affect.    ED Course  Procedures (including critical care time) Labs Review Labs Reviewed - No data to display Imaging Review No results found.  EKG Interpretation   None       MDM   1. Acute bacterial conjunctivitis of both eyes     Jacqueline Clarke presents with symptoms consistent with bacterial conjunctivitis.  Purulent discharge exam.  No corneal abrasions, entrapment, consensual  photophobia, or dendritic staining with fluorescein study.  Presentation non-concerning for iritis, keratitis, acute angle glaucoma, corneal abrasions, gonorrhea or HSV.  Patient will be given cipro ophthalmic as she is a contact lens wearer.  Contacts removed and discarded.  Personal hygiene and frequent handwashing discussed.  Patient advised to followup with ophthalmologist TOMORROW for reevaluation in several days.  Visual acuity not performed because patient does not have glasses with her and has poor uncorrected eyesight.  Patient verbalizes understanding and is agreeable with discharge.  It has been determined that no acute conditions requiring further emergency intervention are present at this time. The patient/guardian have been advised of the diagnosis and plan. We have discussed signs and symptoms that warrant return to the ED, such as changes or worsening in symptoms.   Vital signs are stable at discharge.   BP 101/73  Pulse 97  Temp(Src) 99.1 F (37.3 C) (Oral)  SpO2 100%  LMP 08/25/2012  Patient/guardian has voiced understanding and agreed to follow-up with the PCP or specialist.            Dierdre Forth, PA-C 11/26/12 0031

## 2012-11-26 MED ORDER — CIPROFLOXACIN HCL 0.3 % OP SOLN: 1.0000 [drp] | OPHTHALMIC | Status: DC

## 2012-11-26 MED ORDER — HYDROCODONE-ACETAMINOPHEN 5-325 MG PO TABS
2.0000 | ORAL_TABLET | Freq: Once | ORAL | Status: AC
  Administered 2012-11-26: 2 via ORAL
  Filled 2012-11-26: qty 2

## 2012-11-26 MED ORDER — HYDROCODONE-ACETAMINOPHEN 5-325 MG PO TABS: 1.0000 | ORAL_TABLET | ORAL | Status: DC | PRN

## 2012-11-26 NOTE — ED Notes (Signed)
Pt st's she has been having slight pain in right eye for 2-3 days.  St's today pain became a lot worse.  No known injury to eye

## 2012-11-26 NOTE — ED Provider Notes (Signed)
Medical screening examination/treatment/procedure(s) were performed by non-physician practitioner and as supervising physician I was immediately available for consultation/collaboration.    Vida Roller, MD 11/26/12 [phone]

## 2012-12-13 ENCOUNTER — Encounter (HOSPITAL_COMMUNITY): Payer: Self-pay | Admitting: Emergency Medicine

## 2012-12-13 ENCOUNTER — Emergency Department (HOSPITAL_COMMUNITY)
Admission: EM | Admit: 2012-12-13 | Discharge: 2012-12-14 | Disposition: A | Discharge status: Home / Self Care | Payer: Medicaid Other | Attending: Emergency Medicine | Admitting: Emergency Medicine

## 2012-12-13 DIAGNOSIS — T43502A Poisoning by unspecified antipsychotics and neuroleptics, intentional self-harm, initial encounter: Secondary | ICD-10-CM | POA: Insufficient documentation

## 2012-12-13 DIAGNOSIS — T1491XA Suicide attempt, initial encounter: Secondary | ICD-10-CM

## 2012-12-13 DIAGNOSIS — F329 Major depressive disorder, single episode, unspecified: Secondary | ICD-10-CM | POA: Diagnosis present

## 2012-12-13 DIAGNOSIS — Z862 Personal history of diseases of the blood and blood-forming organs and certain disorders involving the immune mechanism: Secondary | ICD-10-CM | POA: Insufficient documentation

## 2012-12-13 DIAGNOSIS — Z8739 Personal history of other diseases of the musculoskeletal system and connective tissue: Secondary | ICD-10-CM | POA: Insufficient documentation

## 2012-12-13 DIAGNOSIS — Z8614 Personal history of Methicillin resistant Staphylococcus aureus infection: Secondary | ICD-10-CM | POA: Insufficient documentation

## 2012-12-13 DIAGNOSIS — F17200 Nicotine dependence, unspecified, uncomplicated: Secondary | ICD-10-CM | POA: Insufficient documentation

## 2012-12-13 DIAGNOSIS — E039 Hypothyroidism, unspecified: Secondary | ICD-10-CM | POA: Insufficient documentation

## 2012-12-13 DIAGNOSIS — G8929 Other chronic pain: Secondary | ICD-10-CM | POA: Insufficient documentation

## 2012-12-13 DIAGNOSIS — Z8639 Personal history of other endocrine, nutritional and metabolic disease: Secondary | ICD-10-CM | POA: Insufficient documentation

## 2012-12-13 DIAGNOSIS — E669 Obesity, unspecified: Secondary | ICD-10-CM | POA: Insufficient documentation

## 2012-12-13 DIAGNOSIS — N390 Urinary tract infection, site not specified: Secondary | ICD-10-CM

## 2012-12-13 DIAGNOSIS — J45909 Unspecified asthma, uncomplicated: Secondary | ICD-10-CM | POA: Insufficient documentation

## 2012-12-13 DIAGNOSIS — T424X4A Poisoning by benzodiazepines, undetermined, initial encounter: Secondary | ICD-10-CM | POA: Insufficient documentation

## 2012-12-13 DIAGNOSIS — F3289 Other specified depressive episodes: Secondary | ICD-10-CM | POA: Insufficient documentation

## 2012-12-13 DIAGNOSIS — Z3202 Encounter for pregnancy test, result negative: Secondary | ICD-10-CM | POA: Insufficient documentation

## 2012-12-13 DIAGNOSIS — Z79899 Other long term (current) drug therapy: Secondary | ICD-10-CM | POA: Insufficient documentation

## 2012-12-13 DIAGNOSIS — Z8719 Personal history of other diseases of the digestive system: Secondary | ICD-10-CM | POA: Insufficient documentation

## 2012-12-13 LAB — CBC WITH DIFFERENTIAL/PLATELET
Basophils Absolute: 0 10*3/uL (ref 0.0–0.1)
Basophils Relative: 0 % (ref 0–1)
Eosinophils Absolute: 0.1 10*3/uL (ref 0.0–0.7)
HCT: 35.4 % — ABNORMAL LOW (ref 36.0–46.0)
Lymphocytes Relative: 34 % (ref 12–46)
MCHC: 32.2 g/dL (ref 30.0–36.0)
Monocytes Absolute: 0.6 10*3/uL (ref 0.1–1.0)
Neutro Abs: 3.8 10*3/uL (ref 1.7–7.7)
Platelets: 280 10*3/uL (ref 150–400)
RDW: 14.7 % (ref 11.5–15.5)

## 2012-12-13 LAB — URINALYSIS, ROUTINE W REFLEX MICROSCOPIC
Ketones, ur: NEGATIVE mg/dL
Nitrite: NEGATIVE
Protein, ur: NEGATIVE mg/dL
Specific Gravity, Urine: 1.021 (ref 1.005–1.030)
Urobilinogen, UA: 1 mg/dL (ref 0.0–1.0)

## 2012-12-13 LAB — COMPREHENSIVE METABOLIC PANEL
ALT: 8 U/L (ref 0–35)
Alkaline Phosphatase: 59 U/L (ref 39–117)
CO2: 24 mEq/L (ref 19–32)
Chloride: 103 mEq/L (ref 96–112)
Creatinine, Ser: 0.72 mg/dL (ref 0.50–1.10)
GFR calc Af Amer: >90 mL/min (ref >90)
GFR calc non Af Amer: >90 mL/min (ref >90)
Glucose, Bld: 83 mg/dL (ref 70–99)
Potassium: 3.9 mEq/L (ref 3.5–5.1)
Sodium: 137 mEq/L (ref 135–145)
Total Bilirubin: 0.9 mg/dL (ref 0.3–1.2)
Total Protein: 7.6 g/dL (ref 6.0–8.3)

## 2012-12-13 LAB — URINE MICROSCOPIC-ADD ON

## 2012-12-13 LAB — RAPID URINE DRUG SCREEN, HOSP PERFORMED
Barbiturates: NOT DETECTED
Benzodiazepines: POSITIVE — AB

## 2012-12-13 LAB — PREGNANCY, URINE: Preg Test, Ur: NEGATIVE

## 2012-12-13 LAB — SALICYLATE LEVEL: Salicylate Lvl: <2 mg/dL — ABNORMAL LOW (ref 2.8–20.0)

## 2012-12-13 LAB — ETHANOL: Alcohol, Ethyl (B): <11 mg/dL (ref 0–11)

## 2012-12-13 MED ORDER — ONDANSETRON HCL 4 MG PO TABS: 4.0000 mg | ORAL_TABLET | Freq: Three times a day (TID) | ORAL | Status: DC | PRN

## 2012-12-13 MED ORDER — NICOTINE 21 MG/24HR TD PT24: 21.0000 mg | MEDICATED_PATCH | Freq: Every day | TRANSDERMAL | Status: DC

## 2012-12-13 MED ORDER — HALOPERIDOL LACTATE 5 MG/ML IJ SOLN
5.0000 mg | Freq: Once | INTRAMUSCULAR | Status: AC
  Administered 2012-12-13: 5 mg via INTRAMUSCULAR
  Filled 2012-12-13: qty 1

## 2012-12-13 MED ORDER — LORAZEPAM 1 MG PO TABS: 1.0000 mg | ORAL_TABLET | Freq: Three times a day (TID) | ORAL | Status: DC | PRN

## 2012-12-13 MED ORDER — ACETAMINOPHEN 325 MG PO TABS: 650.0000 mg | ORAL_TABLET | ORAL | Status: DC | PRN

## 2012-12-13 MED ORDER — ALUM & MAG HYDROXIDE-SIMETH 200-200-20 MG/5ML PO SUSP: 30.0000 mL | ORAL | Status: DC | PRN

## 2012-12-13 MED ORDER — SODIUM CHLORIDE 0.9 % IV SOLN: 1000.0000 mL | Freq: Once | INTRAVENOUS | Status: DC

## 2012-12-13 MED ORDER — IBUPROFEN 200 MG PO TABS: 600.0000 mg | ORAL_TABLET | Freq: Three times a day (TID) | ORAL | Status: DC | PRN

## 2012-12-13 MED ORDER — NITROFURANTOIN MONOHYD MACRO 100 MG PO CAPS
100.0000 mg | ORAL_CAPSULE | Freq: Two times a day (BID) | ORAL | Status: DC
  Administered 2012-12-14: 100 mg via ORAL
  Filled 2012-12-13 (×3): qty 1

## 2012-12-13 NOTE — ED Notes (Signed)
Tele-psych consult unable to be completed at this time due to patient being sedation s/p admin of Haldol, see MAR Patient easily awakens to verbal stimuli Sitter remains at bedside  VS updated and stable

## 2012-12-13 NOTE — ED Notes (Signed)
Patient's belongings: One white shirt One pair of stretch pants Drivers license Bank cards x 2 One store receipt One pair of black socks  Bag with belongings at nurse's station for rooms 1-4

## 2012-12-13 NOTE — ED Notes (Signed)
Bed: WA04 Expected date:  Expected time:  Means of arrival:  Comments: NEEDS CLOROX

## 2012-12-13 NOTE — BH Assessment (Signed)
TTS attempted to assess patient. Patient was unable to cooperate for assessment due to recent Haldol injection that was  administered about 45 minutes ago as reported by patient's nurse Roswell Miners. Informed EDP Dr. Elesa Massed that patient would need to be assessed in the morning due to patient not being able to cooperate or answer questions as she is sedated from medication.   Glorious Peach, MS, LCASA Assessment Counselor

## 2012-12-13 NOTE — ED Notes (Signed)
Patient attempting to pull PIV out Patient medicated with Haldol, see MAR PIV site removed Patient escorted to bathroom by security staff

## 2012-12-13 NOTE — ED Notes (Signed)
Patient arrived s/p overdose of Ativan and Xanax

## 2012-12-13 NOTE — ED Notes (Signed)
Patient back in room from bathroom Urine sent Patient given ginger ale

## 2012-12-13 NOTE — ED Notes (Signed)
Sitter now at bedside.

## 2012-12-13 NOTE — ED Notes (Signed)
Bed: WA01 Expected date:  Expected time:  Means of arrival:  Comments: EMS/OD 

## 2012-12-13 NOTE — ED Notes (Signed)
Per Dr. Elesa Massed, patient is to be IVC Patient does not want to stay, trying to leave ED Security called and is currently at bedside

## 2012-12-13 NOTE — ED Notes (Signed)
Patient alert and oriented x 4, but sleepy/lethargic Patient states that she has tried to commit suicide before by taking pills--will not say why she took pills tonight

## 2012-12-13 NOTE — BH Assessment (Addendum)
[facility] Assessment Progress Note      Spoke with Dr Elesa Massed regarding pt presentation for TTS consult.  27 yof w history of anxiety and prior suicie attempt when she attempted to cut her wrist.  Overdose earlier today on ativan and xanax and has had SI for years.  Recently IVCd because she is wanting to leave.  Will follow up with TTS staff at [facility] and RN to schedule consult.

## 2012-12-13 NOTE — ED Provider Notes (Signed)
TIME SEEN: 8:33 PM  CHIEF COMPLAINT: Overdose, suicide attempt  HPI: Patient is a 27 year-old female with a history of asthma, hypothyroidism, anxiety who presents the emergency department after an overdose on Ativan and Xanax. She reports that she took approximately 10 pills total of these medications in attempt to hurt herself. She has had years worth of suicidal ideation. She states she has had prior suicide attempts by cutting her wrist. She denies any other coingestants. No drugs or alcohol. No homicidal ideation, hallucinations or delusions. Denies any recent fever, chest pain or abnormal pain, shortness of breath, numbness, tingling or focal weakness. She has had 2 episodes of nonbloody, nonbilious vomiting for the past 2 days. No diarrhea.  ROS: See HPI Constitutional: no fever  Eyes: no drainage  ENT: no runny nose   Cardiovascular:  no chest pain  Resp: no SOB  GI: no vomiting GU: no dysuria Integumentary: no rash  Allergy: no hives  Musculoskeletal: no leg swelling  Neurological: no slurred speech ROS otherwise negative  PAST MEDICAL HISTORY/PAST SURGICAL HISTORY:  Past Medical History  Diagnosis Date  . Asthma   . Scoliosis   . Blood type, Rh negative   . Pregnant state, incidental   . Anemia   . Chronic back pain     scolosis- on percocet  . Hyperemesis complicating pregnancy, antepartum     Zofran PRN  . Headache(784.0)   . Cholelithiasis   . Pregnancy induced hypertension   . Hypothyroidism   . 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 Obesity     MEDICATIONS:  Prior to Admission medications   Medication Sig Start Date End Date Taking? Authorizing Provider  ALPRAZolam Prudy Feeler) 0.5 MG tablet Take 0.5 mg by mouth 3 (three) times daily as needed for anxiety.    Historical Provider, MD  ciprofloxacin (CILOXAN) 0.3 % ophthalmic solution Place 1 drop into both eyes every 2 (two) hours. Administer 1 drop, every 2 hours, while awake, for 2 days. Then 1 drop,  every 4 hours, while awake, for the next 5 days. 11/26/12   Hannah Muthersbaugh, PA-C  cyclobenzaprine (FLEXERIL) 10 MG tablet Take 10 mg by mouth 2 (two) times daily as needed for muscle spasms.    Historical Provider, MD  etonogestrel (IMPLANON) 68 MG IMPL implant Inject 1 each into the skin once.    Historical Provider, MD  HYDROcodone-acetaminophen (NORCO/VICODIN) 5-325 MG per tablet Take 1 tablet by mouth every 4 (four) hours as needed. 11/26/12   Hannah Muthersbaugh, PA-C  pseudoephedrine (SUDAFED) 120 MG 12 hr tablet Take 1 tablet (120 mg total) by mouth 2 (two) times daily. 11/23/12   Arman Filter, NP    ALLERGIES:  No Known Allergies  SOCIAL HISTORY:  History  Substance Use Topics  . Smoking status: Current Every Day Smoker -- 0.50 packs/day    Types: Cigarettes    Last Attempt to Quit: 11/17/2010  . Smokeless tobacco: Never Used  . Alcohol Use: Yes    FAMILY HISTORY: Family History  Problem Relation Age of Onset  . Heart disease Maternal Grandfather   . Diabetes Maternal Grandfather   . Anesthesia problems Neg Hx     EXAM: BP 106/76  Pulse 72  Temp(Src) 98.5 F (36.9 C) (Oral)  Resp 18  Ht 5\' 11"  (1.803 m)  Wt 232 lb (105.235 kg)  BMI 32.37 kg/m2  SpO2 100% CONSTITUTIONAL: Alert and oriented and responds appropriately to questions. Well-appearing; well-nourished; drowsy but arousable  HEAD: Normocephalic EYES: Conjunctivae clear, PERRL ENT: normal nose; no rhinorrhea; moist mucous membranes; pharynx without lesions noted NECK: Supple, no meningismus, no LAD  CARD: RRR; S1 and S2 appreciated; no murmurs, no clicks, no rubs, no gallops RESP: Normal chest excursion without splinting or tachypnea; breath sounds clear and equal bilaterally; no wheezes, no rhonchi, no rales,  ABD/GI: Normal bowel sounds; non-distended; soft, non-tender, no rebound, no guarding BACK:  The back appears normal and is non-tender to palpation, there is no CVA tenderness EXT: Normal ROM in  all joints; non-tender to palpation; no edema; normal capillary refill; no cyanosis    SKIN: Normal color for age and race; warm NEURO: Moves all extremities equally; mild slurred speech, no other cranial nerve deficit, no drift, sensation to light touch intact diffusely PSYCH: Patient has flat affect, is tearful on exam, endorses suicidal ideation. No homicidal ideation, hallucinations or delusions. Grooming and personal hygiene are appropriate.  MEDICAL DECISION MAKING: Patient here with intentional overdose. She is hemodynamically stable. Will continue to monitor closely. We'll obtain labs, urine. Will discuss with psychiatry for evaluation once medically cleared.  ED PROGRESS: Pt now wanted to leave the emergency department. Given that she endorses suicidal ideation and had an intentional overdose tonight, I do not feel she is safe to leave. Have discussed with behavioral health. Will complete IVC.   Patient's labs are unremarkable. Her urine drug screen is positive for benzodiazepines. Urine does show signs of infection. She has large hemoglobin, leukocytes and many bacteria. Will treat with antibiotics. Urine culture pending. Patient is now medically cleared. Given she is very drowsy after receiving Haldol, behavioral health was unable to evaluate patient and will reevaluate in the morning for suicidal ideation, suicide attempt.  Layla Maw Fanchon P[place]ia, DO 12/13/12 2300

## 2012-12-13 NOTE — ED Notes (Signed)
Medication bottles given to this nurse as follows:  Xanax 0.5 mg (filled on 10/15/2012)--Empty Ativan 1 mg (filled on 12/08/2012) Original quantity 60: On arrival bottle has only 37 pills

## 2012-12-13 NOTE — ED Notes (Signed)
Dr. Ward at bedside.

## 2012-12-14 ENCOUNTER — Encounter (HOSPITAL_COMMUNITY): Payer: Self-pay | Admitting: Registered Nurse

## 2012-12-14 DIAGNOSIS — F329 Major depressive disorder, single episode, unspecified: Secondary | ICD-10-CM

## 2012-12-14 NOTE — ED Notes (Signed)
Patient resting in position of comfort with eyes closed RR WNL--even and unlabored with equal rise and fall of chest Patient in NAD Side rails up, call bell in reach  

## 2012-12-14 NOTE — ED Notes (Signed)
Pt has meds locked up in Pharmacy 

## 2012-12-14 NOTE — Progress Notes (Signed)
Writer consulted with Dr. Ladona Ridgel and the NP, [facility] regarding the patient denying SI, HI and psychosis.  Dr. Ladona Ridgel wants additional collateral information from the patients mother.    Writer spoke to the patients Mother Nahla Lukin [phone]) regarding the patients base line and her level of supports at home.   Her mother reports that she would have to call me back in 30 minutes because she is on the bus and is not able to talk.

## 2012-12-14 NOTE — ED Notes (Signed)
Patient resting in position of comfort with eyes closed RR WNL--even and unlabored with equal rise and fall of chest Patient in NAD Side rails up 

## 2012-12-14 NOTE — Progress Notes (Signed)
Writer spoke to the patients mother Raymond Azure [phone]) and she was able to confirm that the patient has supportive supports with her mother and fiance.  Her mother reports that she has never attempted to harm herself in the past.    Writer spoke to the patient and was able to sign a no harm contract.   Writer consulted with the ER MD regarding the patient not meeting criteria per the Psychiatrist (Dr. Ladona Ridgel) and the NP, [facility]).  The ER MD rescinded the IVC.  Writer faxed the IVC paperwork to the [employer]   Writer provided the patient with outpatient referrals for medication management and mental health therpay.
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
## 2012-12-14 NOTE — ED Provider Notes (Signed)
2:17 PM Pt was seen and evaluated by the psychiatrist to Dr Ladona Ridgel who believes the patient is safe for discharge home.  He is recommended at reversal of involuntary commitment papers be performed.  No harm contract signed.  Discharge home with outpatient resources for substance abuse.  Please see consultation note for complete details  Lyanne Co, MD 12/14/12 1418

## 2012-12-14 NOTE — ED Notes (Signed)
Pt belongings inventoried on belongings sheet in shadow chart. Items: 1 pair jeans, 1 white shirt, 1 pair black socks, 1 [place] [address] Debit [address][id number], 1 EBT #[id number], 1 [place] ID. Pt belongings placed in locker #42

## 2012-12-14 NOTE — ED Notes (Signed)
Patient moved from ED 4 to ED 27

## 2012-12-14 NOTE — ED Notes (Signed)
2 Belonging bags placed in locker 42

## 2012-12-15 LAB — URINE CULTURE: Colony Count: >=100000

## 2012-12-15 NOTE — Consult Note (Signed)
Note reviewed and agreed with  

## 2013-05-12 ENCOUNTER — Emergency Department (HOSPITAL_COMMUNITY)
Admission: EM | Admit: 2013-05-12 | Discharge: 2013-05-12 | Disposition: A | Discharge status: Home / Self Care | Payer: Medicaid Other | Attending: Emergency Medicine | Admitting: Emergency Medicine

## 2013-05-12 ENCOUNTER — Encounter (HOSPITAL_COMMUNITY): Payer: Self-pay | Admitting: Emergency Medicine

## 2013-05-12 ENCOUNTER — Emergency Department (HOSPITAL_COMMUNITY): Payer: Medicaid Other

## 2013-05-12 DIAGNOSIS — E669 Obesity, unspecified: Secondary | ICD-10-CM | POA: Insufficient documentation

## 2013-05-12 DIAGNOSIS — Z8614 Personal history of Methicillin resistant Staphylococcus aureus infection: Secondary | ICD-10-CM | POA: Insufficient documentation

## 2013-05-12 DIAGNOSIS — Z862 Personal history of diseases of the blood and blood-forming organs and certain disorders involving the immune mechanism: Secondary | ICD-10-CM | POA: Insufficient documentation

## 2013-05-12 DIAGNOSIS — Z8739 Personal history of other diseases of the musculoskeletal system and connective tissue: Secondary | ICD-10-CM | POA: Insufficient documentation

## 2013-05-12 DIAGNOSIS — R071 Chest pain on breathing: Secondary | ICD-10-CM | POA: Insufficient documentation

## 2013-05-12 DIAGNOSIS — F17200 Nicotine dependence, unspecified, uncomplicated: Secondary | ICD-10-CM | POA: Insufficient documentation

## 2013-05-12 DIAGNOSIS — J45909 Unspecified asthma, uncomplicated: Secondary | ICD-10-CM | POA: Insufficient documentation

## 2013-05-12 DIAGNOSIS — R0789 Other chest pain: Secondary | ICD-10-CM

## 2013-05-12 DIAGNOSIS — Z8719 Personal history of other diseases of the digestive system: Secondary | ICD-10-CM | POA: Insufficient documentation

## 2013-05-12 DIAGNOSIS — G8929 Other chronic pain: Secondary | ICD-10-CM | POA: Insufficient documentation

## 2013-05-12 LAB — BASIC METABOLIC PANEL
BUN: 13 mg/dL (ref 6–23)
CALCIUM: 9.4 mg/dL (ref 8.4–10.5)
CHLORIDE: 105 meq/L (ref 96–112)
CO2: 26 mEq/L (ref 19–32)
CREATININE: 0.77 mg/dL (ref 0.50–1.10)
GFR calc Af Amer: >90 mL/min (ref >90)
GFR calc non Af Amer: >90 mL/min (ref >90)
Glucose, Bld: 86 mg/dL (ref 70–99)
Potassium: 3.6 mEq/L — ABNORMAL LOW (ref 3.7–5.3)
Sodium: 142 mEq/L (ref 137–147)

## 2013-05-12 LAB — CBC
HCT: 37.2 % (ref 36.0–46.0)
Hemoglobin: 12 g/dL (ref 12.0–15.0)
MCH: 26.7 pg (ref 26.0–34.0)
MCHC: 32.3 g/dL (ref 30.0–36.0)
MCV: 82.9 fL (ref 78.0–100.0)
PLATELETS: 267 10*3/uL (ref 150–400)
RBC: 4.49 MIL/uL (ref 3.87–5.11)
RDW: 14.2 % (ref 11.5–15.5)
WBC: 7.1 10*3/uL (ref 4.0–10.5)

## 2013-05-12 LAB — I-STAT TROPONIN, ED: TROPONIN I, POC: 0.01 ng/mL (ref 0.00–0.08)

## 2013-05-12 MED ORDER — NAPROXEN 500 MG PO TABS: 500.0000 mg | ORAL_TABLET | Freq: Two times a day (BID) | ORAL | Status: DC

## 2013-05-12 MED ORDER — BENZONATATE 100 MG PO CAPS: 200.0000 mg | ORAL_CAPSULE | Freq: Two times a day (BID) | ORAL | Status: DC | PRN

## 2013-05-12 NOTE — ED Provider Notes (Signed)
CSN: [id number]     Arrival date & time 05/12/13  1743 History   First MD Initiated Contact with Patient 05/12/13 2105     Chief Complaint  Patient presents with  . Chest Pain    (Consider location/radiation/quality/duration/timing/severity/associated sxs/prior Treatment) HPI Comments: Patient is a 28 year old female who presents today for chest pain which started this morning. Patient states that pain is sharp and present in her left anterior chest. She states that the pain radiates up towards her left neck. Pain is worse with movement of her left shoulder and deep breathing. She states that symptoms proceeded by a cough x4 days which has been nonproductive without modifying factors. Patient states she took an aspirin for her chest pain without improvement in her symptoms. She denies associated fever, nasal congestion, rhinorrhea, hemoptysis, shortness of breath, syncope, vomiting, abdominal pain, extremity numbness/tingling, and weakness. Patient denies a history of ACS or risk factors for heart disease. She denies a history of DVT/PE.  Patient is a 28 y.o. female presenting with chest pain. The history is provided by the patient. No language interpreter was used.  Chest Pain Associated symptoms: cough     Past Medical History  Diagnosis Date  . Asthma   . Scoliosis   . Blood type, Rh negative   . Pregnant state, incidental   . Anemia   . Chronic back pain     scolosis- on percocet  . Hyperemesis complicating pregnancy, antepartum     Zofran PRN  . Headache(784.0)   . Cholelithiasis   . Pregnancy induced hypertension   . Hypothyroidism   . 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 Obesity    Past Surgical History  Procedure Laterality Date  . Cholecystectomy    . Adenoidectomy    . Dilation and curettage of uterus  2008   Family History  Problem Relation Age of Onset  . Heart disease Maternal Grandfather   . Diabetes Maternal Grandfather   . Anesthesia problems  Neg Hx    History  Substance Use Topics  . Smoking status: Current Every Day Smoker -- 0.50 packs/day    Types: Cigarettes    Last Attempt to Quit: 11/17/2010  . Smokeless tobacco: Never Used  . Alcohol Use: Yes   OB History   Grav Para Term Preterm Abortions TAB SAB Ect Mult Living   5 3 3  0 2 0 2 0 0 3      Review of Systems  Respiratory: Positive for cough.   Cardiovascular: Positive for chest pain.  All other systems reviewed and are negative.    Allergies  Review of patient's allergies indicates no known allergies.  Home Medications   Prior to Admission medications   Medication Sig Start Date End Date Taking? Authorizing Provider  ALPRAZolam Prudy Feeler(XANAX) 0.5 MG tablet Take 0.5 mg by mouth 3 (three) times daily as needed for anxiety.   Yes Historical Provider, MD  etonogestrel (IMPLANON) 68 MG IMPL implant Inject 1 each into the skin once.   Yes Historical Provider, MD  LORazepam (ATIVAN) 1 MG tablet Take 1 mg by mouth every 8 (eight) hours.   Yes Historical Provider, MD   BP 106/73  Pulse 80  Temp(Src) 97.7 F (36.5 C) (Oral)  Resp 16  SpO2 100%  LMP 04/29/2013  Physical Exam  Nursing note and vitals reviewed. Constitutional: She is oriented to person, place, and time. She appears well-developed and well-nourished. No distress.  HENT:  Head: Normocephalic and  atraumatic.  Mouth/Throat: Oropharynx is clear and moist. No oropharyngeal exudate.  Eyes: Conjunctivae and EOM are normal. Pupils are equal, round, and reactive to light. No scleral icterus.  Neck: Normal range of motion. Neck supple.  Cardiovascular: Normal rate, regular rhythm and normal heart sounds.   No JVD. No M/R/G.  Pulmonary/Chest: Effort normal and breath sounds normal. No respiratory distress. She has no wheezes. She has no rales. She exhibits tenderness.  Chest expansion symmetric. TTP of L anterior chest without retraction or crepitus.   Abdominal: Soft. There is no tenderness. There is no  rebound and no guarding.  Musculoskeletal: Normal range of motion.  Neurological: She is alert and oriented to person, place, and time.  GCS 15. Patient moves extremities without ataxia. She ambulates with normal gait.  Skin: Skin is warm and dry. No rash noted. She is not diaphoretic. No erythema. No pallor.  Psychiatric: She has a normal mood and affect. Her behavior is normal.    ED Course  Procedures (including critical care time) Labs Review Labs Reviewed  BASIC METABOLIC PANEL - Abnormal; Notable for the following:    Potassium 3.6 (*)    All other components within normal limits  CBC  I-STAT TROPOININ, ED    Imaging Review Dg Chest 2 View  05/12/2013   CLINICAL DATA:  Chest pain.  EXAM: CHEST  2 VIEW  COMPARISON:  DG CHEST 2 VIEW dated 11/20/2012  FINDINGS: Mediastinum and hilar structures normal. The lungs are clear of acute infiltrates. Heart size normal. No pleural effusion or pneumothorax. Degenerative changes thoracic spine.  IMPRESSION: No active cardiopulmonary disease.   Electronically Signed   By: Maisie Fushomas  Register   On: 05/12/2013 20:33     Date: 05/12/2013  Rate: 81   Rhythm: normal sinus rhythm  QRS Axis: normal  Intervals: normal  ST/T Wave abnormalities: normal  Conduction Disutrbances:none  Narrative Interpretation: NSR. No STEMI or ischemic change  Old EKG Reviewed: none available I have personally reviewed and interpreted this EKG   MDM   Final diagnoses:  Costochondral chest pain    28 year old female presents to the emergency department for left-sided chest pain which is worse with deep breathing and left shoulder movement. Patient states that symptoms preceded by a nonproductive cough x4 days. Patient denies a history of risk factors for ACS. Cardiac work up today is unremarkable. Doubt ACS given atypical symptoms, reassuring work up, and Heart Score of 0. Also doubt PE given lack of tachycardia, tachypnea, dyspnea, or hypoxia. Symptoms most  consistent with costochondritis, likely secondary to persistent cough. Patient to be discharged with prescription for Tessalon and naproxen. Return precautions provided and patient agreeable to plan with no unaddressed concerns.   Filed Vitals:   05/12/13 1746 05/12/13 2027 05/12/13 2154  BP: 103/71 113/70 106/73  Pulse: 83 79 80  Temp: 98.3 F (36.8 C) 98.8 F (37.1 C) 97.7 F (36.5 C)  TempSrc: Oral Oral Oral  Resp: 20 16   SpO2: 100% 100% 100%     Antony MaduraKelly Dartagnan Beavers, PA-C 05/12/13 2237

## 2013-05-12 NOTE — ED Notes (Signed)
Pt reports left side upper chest pains since this am. Radiates into her neck and back. No acute distress noted at triage, ekg done.

## 2013-05-12 NOTE — ED Notes (Signed)
Computer in room froze at time of discharge, unable to obtain signature from pt, pt states she has to leave. Pt states she understands how to take her prescriptions and her discharge instructions including follow up. PA aware of pt's pain, pt written pain medication to be picked up from pharmacy.

## 2013-05-12 NOTE — ED Notes (Signed)
Pt reports she needs to leave, pt informed [facility] would have [facility] willing to stay at this time.

## 2013-05-12 NOTE — Discharge Instructions (Signed)

## 2013-05-13 NOTE — ED Provider Notes (Signed)
Medical screening examination/treatment/procedure(s) were performed by non-physician practitioner and as supervising physician I was immediately available for consultation/collaboration.   EKG Interpretation None        William Ichelle Harral, MD 05/13/13 0029 

## 2013-08-15 ENCOUNTER — Encounter (HOSPITAL_COMMUNITY): Payer: Self-pay | Admitting: Emergency Medicine

## 2013-08-15 ENCOUNTER — Emergency Department (HOSPITAL_COMMUNITY)
Admission: EM | Admit: 2013-08-15 | Discharge: 2013-08-15 | Disposition: A | Discharge status: Home / Self Care | Payer: No Typology Code available for payment source | Attending: Emergency Medicine | Admitting: Emergency Medicine

## 2013-08-15 DIAGNOSIS — Y9389 Activity, other specified: Secondary | ICD-10-CM | POA: Insufficient documentation

## 2013-08-15 DIAGNOSIS — S0993XA Unspecified injury of face, initial encounter: Secondary | ICD-10-CM | POA: Insufficient documentation

## 2013-08-15 DIAGNOSIS — Y92410 Unspecified street and highway as the place of occurrence of the external cause: Secondary | ICD-10-CM | POA: Insufficient documentation

## 2013-08-15 DIAGNOSIS — J45909 Unspecified asthma, uncomplicated: Secondary | ICD-10-CM | POA: Insufficient documentation

## 2013-08-15 DIAGNOSIS — S335XXA Sprain of ligaments of lumbar spine, initial encounter: Secondary | ICD-10-CM | POA: Insufficient documentation

## 2013-08-15 DIAGNOSIS — Z8739 Personal history of other diseases of the musculoskeletal system and connective tissue: Secondary | ICD-10-CM | POA: Insufficient documentation

## 2013-08-15 DIAGNOSIS — S0990XA Unspecified injury of head, initial encounter: Secondary | ICD-10-CM | POA: Diagnosis not present

## 2013-08-15 DIAGNOSIS — Z79899 Other long term (current) drug therapy: Secondary | ICD-10-CM | POA: Insufficient documentation

## 2013-08-15 DIAGNOSIS — Z862 Personal history of diseases of the blood and blood-forming organs and certain disorders involving the immune mechanism: Secondary | ICD-10-CM | POA: Insufficient documentation

## 2013-08-15 DIAGNOSIS — E669 Obesity, unspecified: Secondary | ICD-10-CM | POA: Diagnosis not present

## 2013-08-15 DIAGNOSIS — Z8614 Personal history of Methicillin resistant Staphylococcus aureus infection: Secondary | ICD-10-CM | POA: Diagnosis not present

## 2013-08-15 DIAGNOSIS — S161XXA Strain of muscle, fascia and tendon at neck level, initial encounter: Secondary | ICD-10-CM

## 2013-08-15 DIAGNOSIS — S139XXA Sprain of joints and ligaments of unspecified parts of neck, initial encounter: Secondary | ICD-10-CM | POA: Insufficient documentation

## 2013-08-15 DIAGNOSIS — F17200 Nicotine dependence, unspecified, uncomplicated: Secondary | ICD-10-CM | POA: Insufficient documentation

## 2013-08-15 DIAGNOSIS — Z8639 Personal history of other endocrine, nutritional and metabolic disease: Secondary | ICD-10-CM | POA: Insufficient documentation

## 2013-08-15 DIAGNOSIS — S199XXA Unspecified injury of neck, initial encounter: Secondary | ICD-10-CM

## 2013-08-15 DIAGNOSIS — G8929 Other chronic pain: Secondary | ICD-10-CM | POA: Insufficient documentation

## 2013-08-15 DIAGNOSIS — Z8719 Personal history of other diseases of the digestive system: Secondary | ICD-10-CM | POA: Insufficient documentation

## 2013-08-15 DIAGNOSIS — S39012A Strain of muscle, fascia and tendon of lower back, initial encounter: Secondary | ICD-10-CM

## 2013-08-15 MED ORDER — HYDROCODONE-ACETAMINOPHEN 5-325 MG PO TABS: 1.0000 | ORAL_TABLET | Freq: Four times a day (QID) | ORAL | Status: DC | PRN

## 2013-08-15 MED ORDER — METHOCARBAMOL 500 MG PO TABS
500.0000 mg | ORAL_TABLET | Freq: Once | ORAL | Status: AC
  Administered 2013-08-15: 500 mg via ORAL
  Filled 2013-08-15: qty 1

## 2013-08-15 MED ORDER — IBUPROFEN 400 MG PO TABS
800.0000 mg | ORAL_TABLET | Freq: Once | ORAL | Status: AC
  Administered 2013-08-15: 800 mg via ORAL
  Filled 2013-08-15: qty 2

## 2013-08-15 MED ORDER — METHOCARBAMOL 750 MG PO TABS: 750.0000 mg | ORAL_TABLET | Freq: Four times a day (QID) | ORAL | Status: DC | PRN

## 2013-08-15 MED ORDER — NAPROXEN 500 MG PO TABS: 500.0000 mg | ORAL_TABLET | Freq: Two times a day (BID) | ORAL | Status: DC | PRN
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
## 2013-08-15 NOTE — Discharge Instructions (Signed)
1. Medications: robaxin, naproxyn, vicodin, usual home medications 2. Treatment: rest, drink plenty of fluids, gentle stretching as discussed, alternate ice and heat 3. Follow Up: Please followup with your primary doctor for discussion of your diagnoses and further evaluation after today's visit; if you do not have a primary care doctor use the resource guide provided to find one;    Back Exercises Back exercises help treat and prevent back injuries. The goal of back exercises is to increase the strength of your abdominal and back muscles and the flexibility of your back. These exercises should be started when you no longer have back pain. Back exercises include:  Pelvic Tilt. Lie on your back with your knees bent. Tilt your pelvis until the lower part of your back is against the floor. Hold this position 5 to 10 sec and repeat 5 to 10 times.  Knee to Chest. Pull first 1 knee up against your chest and hold for 20 to 30 seconds, repeat this with the other knee, and then both knees. This may be done with the other leg straight or bent, whichever feels better.  Sit-Ups or Curl-Ups. Bend your knees 90 degrees. Start with tilting your pelvis, and do a partial, slow sit-up, lifting your trunk only 30 to 45 degrees off the floor. Take at least 2 to 3 seconds for each sit-up. Do not do sit-ups with your knees out straight. If partial sit-ups are difficult, simply do the above but with only tightening your abdominal muscles and holding it as directed.  Hip-Lift. Lie on your back with your knees flexed 90 degrees. Push down with your feet and shoulders as you raise your hips a couple inches off the floor; hold for 10 seconds, repeat 5 to 10 times.  Back arches. Lie on your stomach, propping yourself up on bent elbows. Slowly press on your hands, causing an arch in your low back. Repeat 3 to 5 times. Any initial stiffness and discomfort should lessen with repetition over time.  Shoulder-Lifts. Lie face down  with arms beside your body. Keep hips and torso pressed to floor as you slowly lift your head and shoulders off the floor. Do not overdo your exercises, especially in the beginning. Exercises may cause you some mild back discomfort which lasts for a few minutes; however, if the pain is more severe, or lasts for more than 15 minutes, do not continue exercises until you see your caregiver. Improvement with exercise therapy for back problems is slow.  See your caregivers for assistance with developing a proper back exercise program. Document Released: 02/14/2004 Document Revised: 03/31/2011 Document Reviewed: 11/07/2010 [facility] Patient Information 2015 [place], [place]. This information is not intended to replace advice given to you by your health care provider. Make sure you discuss any questions you have with your health care provider.  Lumbosacral Strain Lumbosacral strain is a strain of any of the parts that make up your lumbosacral vertebrae. Your lumbosacral vertebrae are the bones that make up the lower third of your backbone. Your lumbosacral vertebrae are held together by muscles and tough, fibrous tissue (ligaments).  CAUSES  A sudden blow to your back can cause lumbosacral strain. Also, anything that causes an excessive stretch of the muscles in the low back can cause this strain. This is typically seen when people exert themselves strenuously, fall, lift heavy objects, bend, or crouch repeatedly. RISK FACTORS  Physically demanding work.  Participation in pushing or pulling sports or sports that require a sudden twist of the  back (tennis, golf, baseball).  Weight lifting.  Excessive lower back curvature.  Forward-tilted pelvis.  Weak back or abdominal muscles or both.  Tight hamstrings. SIGNS AND SYMPTOMS  Lumbosacral strain may cause pain in the area of your injury or pain that moves (radiates) down your leg.  DIAGNOSIS Your health care provider can often diagnose lumbosacral  strain through a physical exam. In some cases, you may need tests such as X-ray exams.  TREATMENT  Treatment for your lower back injury depends on many factors that your clinician will have to evaluate. However, most treatment will include the use of anti-inflammatory medicines. HOME CARE INSTRUCTIONS   Avoid hard physical activities (tennis, racquetball, waterskiing) if you are not in proper physical condition for it. This may aggravate or create problems.  If you have a back problem, avoid sports requiring sudden body movements. Swimming and walking are generally safer activities.  Maintain good posture.  Maintain a healthy weight.  For acute conditions, you may put ice on the injured area.  Put ice in a plastic bag.  Place a towel between your skin and the bag.  Leave the ice on for 20 minutes, 2-3 times a day.  When the low back starts healing, stretching and strengthening exercises may be recommended. SEEK MEDICAL CARE IF:  Your back pain is getting worse.  You experience severe back pain not relieved with medicines. SEEK IMMEDIATE MEDICAL CARE IF:   You have numbness, tingling, weakness, or problems with the use of your arms or legs.  There is a change in bowel or bladder control.  You have increasing pain in any area of the body, including your belly (abdomen).  You notice shortness of breath, dizziness, or feel faint.  You feel sick to your stomach (nauseous), are throwing up (vomiting), or become sweaty.  You notice discoloration of your toes or legs, or your feet get very cold. MAKE SURE YOU:   Understand these instructions.  Will watch your condition.  Will get help right away if you are not doing well or get worse. Document Released: 10/16/2004 Document Revised: 01/11/2013 Document Reviewed: 08/25/2012 [facility] Patient Information 2015 [place]. This information is not intended to replace advice given to you by your health care provider. Make sure  you discuss any questions you have with your health care provider.    Emergency Department Resource Guide 1) Find a Doctor and Pay Out of Pocket Although you won't have to find out who is covered by your insurance plan, it is a good idea to ask around and get recommendations. You will then need to call the office and see if the doctor you have chosen will accept you as a new patient and what types of options they offer for patients who are self-pay. Some doctors offer discounts or will set up payment plans for their patients who do not have insurance, but you will need to ask so you aren't surprised when you get to your appointment.  2) Contact Your Local Health Department Not all health departments have doctors that can see patients for sick visits, but many do, so it is worth a call to see if yours does. If you don't know where your local health department is, you can check in your phone book. The CDC also has a tool to help you locate your state's health department, and many state websites also have listings of all of their local health departments.  3) Find a Walk-in Clinic If your illness is not likely  to be very severe or complicated, you may want to try a walk in clinic. These are popping up all over the country in pharmacies, drugstores, and shopping centers. They're usually staffed by nurse practitioners or physician assistants that have been trained to treat common illnesses and complaints. They're usually fairly quick and inexpensive. However, if you have serious medical issues or chronic medical problems, these are probably not your best option.  No Primary Care Doctor: - Call Health Connect at  [phone] - they can help you locate a primary care doctor that  accepts your insurance, provides certain services, etc. - Physician Referral Service- [phone]  Chronic Pain Problems: Organization         Address  Phone   Notes  [facility]  [phone] Patients need  to be referred by their primary care doctor.   Medication Assistance: Organization         Address  Phone   Notes  [facility] Medication [facility] [address] [place], [place] [address] [phone] --Must be a resident of [facility] -- Must have NO insurance coverage whatsoever (no Medicaid/ Medicare, etc.) -- The pt. MUST have a primary care doctor that directs their care regularly and follows them in the community   [facility]ist  [phone]   [employer]  [phone]    Agencies that provide inexpensive medical care: Organization         Address  Phone   Notes  [facility]  [phone]   [facility]    [phone]   [facility] [address] [place], [place] [address] [phone]   [facility] [address] [place], [place] [phone]   Planned Parenthood    [phone]   [facility]    [phone]   [facility]  [address], [place] Phone:  [phone], Fax:  [phone] Hours of Operation:  9 am - 6 pm, M-F.  Also accepts Medicaid/Medicare and self-pay.  [facility]  [address], [place] Phone: [phone], Fax: [phone]. Hours of Operation:  8:30 am - 5:30 pm, M-F.  Also accepts Medicaid and self-pay.  [facility] [place] [address], [place] Phone: [phone]   [facility] [address] Natasha Bence [place], [place] [phone], Ext. [phone] Mondays & Thursdays: 7-9 AM.  First 15 patients are seen on a first come, first serve basis.    Medicaid-accepting [facility] Providers:  Organization         Address  Phone   Notes  [facility] [address], [place] [phone] Also accepts self-pay patients.  [facility] [address] Laurell Josephs [place], [place]  [phone]   [facility] [address], [place] [phone]   [facility] [address] [place] [phone]   Renaye Rakers [address], [place]   [phone] Only accepts [place] patients after they have their name applied to their card.   Self-Pay (no insurance) in [facility]:  Organization         Address  Phone   Notes  Sickle Cell Patients, [facility] Internal Medicine [address] [place], [place] [phone]   [facility]  Urgent Care [address] [place], [place] [phone]   [facility]  [address], [place] [phone]   [facility]/Dr. Osei-Bonsu  [address] [place] or [address], [place] [phone] Phone number for both [place] and [place] locations is the same.  [facility] [address], [place] [phone]   [facility] [address] [place] or [address] [phone]   [facility] [address] [place] [phone], phone; [phone], fax Sees patients 1st and 3rd Saturday of every month.  Must not qualify for public or private insurance (i.e. Medicaid, Medicare, [place] Health Choice, Veterans' Benefits)  Household income should be no more than 200% of the poverty level The clinic cannot treat you if you are pregnant or think you are pregnant  Sexually transmitted diseases are not treated at the clinic.   Dental Care: Organization         Address  Phone  Notes  [facility] Department of [facility] [address] [place], [place] [phone] Accepts children up to age 35 who are enrolled in [place] or [place] Health Choice; pregnant women with a Medicaid card; and children who have applied for Medicaid or [place] Health Choice, but were declined, whose  parents can pay a reduced fee at time of service.  [facility] Department of [facility]  [address], [place] [phone] Accepts children up to age 76 who are enrolled in [place] or [place] Health Choice; pregnant women with a Medicaid card; and children who have applied for Medicaid or [place] Health Choice, but were declined, whose parents can pay a reduced fee at time of service.  [facility]  [address] [place], [place] [phone] Patients are seen by appointment only. Walk-ins are not accepted. [facility] will see patients 22 years of age and older. Monday - Tuesday (8am-5pm) Most Wednesdays (8:30-5pm) $30 per visit, cash only  [facility]  [address], [facility] [phone] Patients are seen by appointment only. Walk-ins are not accepted. [facility] will see patients 21 years of age and older. One Wednesday Evening (Monthly: Volunteer Based).  $30 per visit, cash only  [employer]  [phone] for adults; Children under age 72, call Graduate Pediatric Dentistry at [phone]. Children aged 41-14, please call [phone] to request a pediatric application.  Dental services are provided in all areas of dental care including fillings, crowns and bridges, complete and partial dentures, implants, gum treatment, root canals, and extractions. Preventive care is also provided. Treatment is provided to both adults and children. Patients are selected via a lottery and there is often a waiting list.   [facility] [address] [place]  [phone] [email or web address]   [facility] [address] [place], [place] [phone], Ext. [phone] Second and Fourth Thursday of each month, opens at 6:30 AM; Clinic ends at 9 AM.  Patients are seen on a first-come first-served basis, and a limited number are seen during each clinic.   [facility]   [address] Ether Griffins [place], [place] [phone]   Eligibility Requirements You must have lived in [place], [place], or [place] counties for at least the last three months.  You cannot be eligible for state or federal sponsored [place]healthcare insurance, including CIGNAVeterans Administration, [place]icaid, or [employer]Medicare.   You generally cannot be eligible for healthcare insurance through your employer.    How to apply: Eligibility screenings are held every Tuesday and Wednesday afternoon from 1:00 pm until 4:00 pm. You do not need an appointment for the interview!  [facility] [address], [place], [place] [phone]   [facility]  [phone]   [facility]  [phone]   [facility]  [phone]    Behavioral Health Resources in the Community: Intensive Outpatient Programs Organization         Address  Phone  Notes  [facility] [address], [place], [place] [phone]   [facility] [address], [place], [place] [phone]   [facility] [address], [place], [place]  [phone]   [facility] [address],  [place], [place] [phone] or [phone]   Substance Abuse Resources Organization         Address  Phone  Notes  [facility]  [phone]   [facility]  [phone]   [facility]  [phone]   [facility]  [phone]   Residential & Outpatient Substance Abuse Program  [phone]   Psychological Services Organization         Address  Phone  Notes  [facility]  [phone]- [phone]   [facility]  [phone]- [phone]   [facility] [address], [place] [phone] or [phone]    Mobile Crisis Teams Organization         Address  Phone  Notes  [facility], Mobile Crisis Care Unit  [phone]     [facility]  [address] [place], [place] [phone]   Doristine LocksSharon DeEsch [address] [place] [phone]    Self-Help/Support Groups Organization         Address  Phone             Notes  Mental Health Assoc. of  - variety of support groups  336- [phone] Call for more information  Narcotics Anonymous (NA), [facility] [address], [place]  2 meetings at this location   Statisticianesidential Treatment Programs Organization         Address  Phone  Notes  [facility] [address],    [place]  [phone]   [facility]  [address], [place] [phone], [place], [place] [phone]   [facility] [address], [place] [phone] Admissions: 8am-3pm M-F  [facility] [address]    [place], [place] [phone]   [facility] [address], [place], [place] [phone]   [facility] [address]  [place], [place] [phone]   [facility] - Intensive Outpatient [address], [place], [place] [phone]   [facility] ([facility] [address]  [place], [place] [phone] or [phone]   [facility] ([facility]) [address] [place], [place] [phone] Accepts Medicaid  [facility] [address]  [place] [phone] Substance Abuse/Addiction Treatment   [facility] Resources Organization         Address  Phone  Notes  [facility]  [phone]   Angie FavaJulie Brannon, PhD [phone], [address] [place], [place]   [phone]) [phone] or [phone]   [facility]   [address] [place], [place] [phone]) [phone]   [facility] [address], [place], [place] [phone] Insurance/Medicaid/sponsorship through [facility]  [facility] [address]                                     [place], [place] [phone] Therapy/tele-psych/case  [facility] [address]   [place], [place] [phone]    Dr. Lolly MustacheArfeen  [phone]   [facility]  [employer] [facility] 1) [address],  2) [address], [place] 3)  [address], [place] [phone]) [phone]  [phone]   [facility] Child Abuse Hotline [phone](336) [phone] or [phone] (After Hours)
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
## 2013-08-15 NOTE — ED Notes (Signed)
Declined W/C at D/C and was escorted to lobby by RN. 

## 2013-08-16 NOTE — ED Provider Notes (Signed)
  Medical screening examination/treatment/procedure(s) were performed by non-physician practitioner and as supervising physician I was immediately available for consultation/collaboration.   EKG Interpretation None         Brinlynn Gorton, MD 08/16/13 0015 

## 2013-11-21 ENCOUNTER — Emergency Department (HOSPITAL_COMMUNITY)
Admission: EM | Admit: 2013-11-21 | Discharge: 2013-11-21 | Disposition: A | Discharge status: Home / Self Care | Payer: Medicaid Other | Attending: Emergency Medicine | Admitting: Emergency Medicine

## 2013-11-21 ENCOUNTER — Encounter (HOSPITAL_COMMUNITY): Payer: Self-pay | Admitting: *Deleted

## 2013-11-21 ENCOUNTER — Emergency Department (HOSPITAL_COMMUNITY): Payer: Medicaid Other

## 2013-11-21 DIAGNOSIS — E669 Obesity, unspecified: Secondary | ICD-10-CM | POA: Diagnosis not present

## 2013-11-21 DIAGNOSIS — Z8614 Personal history of Methicillin resistant Staphylococcus aureus infection: Secondary | ICD-10-CM | POA: Diagnosis not present

## 2013-11-21 DIAGNOSIS — Z862 Personal history of diseases of the blood and blood-forming organs and certain disorders involving the immune mechanism: Secondary | ICD-10-CM | POA: Insufficient documentation

## 2013-11-21 DIAGNOSIS — L03032 Cellulitis of left toe: Secondary | ICD-10-CM | POA: Diagnosis not present

## 2013-11-21 DIAGNOSIS — Y9389 Activity, other specified: Secondary | ICD-10-CM | POA: Insufficient documentation

## 2013-11-21 DIAGNOSIS — J45909 Unspecified asthma, uncomplicated: Secondary | ICD-10-CM | POA: Insufficient documentation

## 2013-11-21 DIAGNOSIS — Z720 Tobacco use: Secondary | ICD-10-CM | POA: Insufficient documentation

## 2013-11-21 DIAGNOSIS — Z8719 Personal history of other diseases of the digestive system: Secondary | ICD-10-CM | POA: Insufficient documentation

## 2013-11-21 DIAGNOSIS — Z6791 Unspecified blood type, Rh negative: Secondary | ICD-10-CM | POA: Diagnosis not present

## 2013-11-21 DIAGNOSIS — Y9289 Other specified places as the place of occurrence of the external cause: Secondary | ICD-10-CM | POA: Insufficient documentation

## 2013-11-21 DIAGNOSIS — Z79899 Other long term (current) drug therapy: Secondary | ICD-10-CM | POA: Insufficient documentation

## 2013-11-21 DIAGNOSIS — B351 Tinea unguium: Secondary | ICD-10-CM | POA: Diagnosis not present

## 2013-11-21 DIAGNOSIS — M79675 Pain in left toe(s): Secondary | ICD-10-CM

## 2013-11-21 DIAGNOSIS — R52 Pain, unspecified: Secondary | ICD-10-CM

## 2013-11-21 DIAGNOSIS — S99922A Unspecified injury of left foot, initial encounter: Secondary | ICD-10-CM | POA: Diagnosis present

## 2013-11-21 DIAGNOSIS — G8929 Other chronic pain: Secondary | ICD-10-CM | POA: Insufficient documentation

## 2013-11-21 DIAGNOSIS — W228XXA Striking against or struck by other objects, initial encounter: Secondary | ICD-10-CM | POA: Diagnosis not present

## 2013-11-21 MED ORDER — CEPHALEXIN 500 MG PO CAPS: 500.0000 mg | ORAL_CAPSULE | Freq: Four times a day (QID) | ORAL | Status: DC

## 2013-11-21 MED ORDER — HYDROCODONE-ACETAMINOPHEN 5-325 MG PO TABS: 1.0000 | ORAL_TABLET | Freq: Four times a day (QID) | ORAL | Status: DC | PRN

## 2013-11-21 MED ORDER — HYDROCODONE-ACETAMINOPHEN 5-325 MG PO TABS
1.0000 | ORAL_TABLET | Freq: Once | ORAL | Status: AC
  Administered 2013-11-21: 1 via ORAL
  Filled 2013-11-21: qty 1

## 2013-11-21 NOTE — Discharge Instructions (Signed)
1. Medications: Keflex, Vicodin, usual home medications 2. Treatment: rest, drink plenty of fluids, use postop shoe 3. Follow Up: Please followup with your primary doctor in 3 days for discussion of your diagnoses and further evaluation after today's visit; if you do not have a primary care doctor use the resource guide provided to find one; Please return to the ER for worsening evidence of infection including increased swelling, increased redness or streaking, fevers chills nausea or vomiting.   Cellulitis Cellulitis is an infection of the skin and the tissue under the skin. The infected area is usually red and tender. This happens most often in the arms and lower legs. HOME CARE   Take your antibiotic medicine as told. Finish the medicine even if you start to feel better.  Keep the infected arm or leg raised (elevated).  Put a warm cloth on the area up to 4 times per day.  Only take medicines as told by your doctor.  Keep all doctor visits as told. GET HELP IF:  You see red streaks on the skin coming from the infected area.  Your red area gets bigger or turns a dark color.  Your bone or joint under the infected area is painful after the skin heals.  Your infection comes back in the same area or different area.  You have a puffy (swollen) bump in the infected area.  You have new symptoms.  You have a fever. GET HELP RIGHT AWAY IF:   You feel very sleepy.  You throw up (vomit) or have watery poop (diarrhea).  You feel sick and have muscle aches and pains. MAKE SURE YOU:   Understand these instructions.  Will watch your condition.  Will get help right away if you are not doing well or get worse. Document Released: 06/25/2007 Document Revised: 05/23/2013 Document Reviewed: 03/24/2011 [facility] Patient Information 2015 [employer], [place]. This information is not intended to replace advice given to you by your health care provider. Make sure you discuss any questions you have  with your health care provider.
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
## 2013-11-30 ENCOUNTER — Emergency Department (HOSPITAL_COMMUNITY)
Admission: EM | Admit: 2013-11-30 | Discharge: 2013-11-30 | Disposition: A | Discharge status: Home / Self Care | Payer: Medicaid Other | Attending: Emergency Medicine | Admitting: Emergency Medicine

## 2013-11-30 ENCOUNTER — Encounter (HOSPITAL_COMMUNITY): Payer: Self-pay | Admitting: Emergency Medicine

## 2013-11-30 DIAGNOSIS — Z862 Personal history of diseases of the blood and blood-forming organs and certain disorders involving the immune mechanism: Secondary | ICD-10-CM | POA: Insufficient documentation

## 2013-11-30 DIAGNOSIS — K029 Dental caries, unspecified: Secondary | ICD-10-CM | POA: Diagnosis not present

## 2013-11-30 DIAGNOSIS — J45909 Unspecified asthma, uncomplicated: Secondary | ICD-10-CM | POA: Diagnosis not present

## 2013-11-30 DIAGNOSIS — Z8679 Personal history of other diseases of the circulatory system: Secondary | ICD-10-CM | POA: Insufficient documentation

## 2013-11-30 DIAGNOSIS — Z8719 Personal history of other diseases of the digestive system: Secondary | ICD-10-CM | POA: Diagnosis not present

## 2013-11-30 DIAGNOSIS — Z792 Long term (current) use of antibiotics: Secondary | ICD-10-CM | POA: Insufficient documentation

## 2013-11-30 DIAGNOSIS — M79675 Pain in left toe(s): Secondary | ICD-10-CM

## 2013-11-30 DIAGNOSIS — Z8739 Personal history of other diseases of the musculoskeletal system and connective tissue: Secondary | ICD-10-CM | POA: Diagnosis not present

## 2013-11-30 DIAGNOSIS — Z720 Tobacco use: Secondary | ICD-10-CM | POA: Insufficient documentation

## 2013-11-30 DIAGNOSIS — L03032 Cellulitis of left toe: Secondary | ICD-10-CM | POA: Insufficient documentation

## 2013-11-30 DIAGNOSIS — G8929 Other chronic pain: Secondary | ICD-10-CM | POA: Insufficient documentation

## 2013-11-30 DIAGNOSIS — E669 Obesity, unspecified: Secondary | ICD-10-CM | POA: Diagnosis not present

## 2013-11-30 DIAGNOSIS — K088 Other specified disorders of teeth and supporting structures: Secondary | ICD-10-CM | POA: Diagnosis present

## 2013-11-30 DIAGNOSIS — Z8614 Personal history of Methicillin resistant Staphylococcus aureus infection: Secondary | ICD-10-CM | POA: Diagnosis not present

## 2013-11-30 DIAGNOSIS — K047 Periapical abscess without sinus: Secondary | ICD-10-CM

## 2013-11-30 DIAGNOSIS — Z79899 Other long term (current) drug therapy: Secondary | ICD-10-CM | POA: Diagnosis not present

## 2013-11-30 DIAGNOSIS — Z791 Long term (current) use of non-steroidal anti-inflammatories (NSAID): Secondary | ICD-10-CM | POA: Diagnosis not present

## 2013-11-30 MED ORDER — KETOROLAC TROMETHAMINE 60 MG/2ML IM SOLN
60.0000 mg | Freq: Once | INTRAMUSCULAR | Status: AC
  Administered 2013-11-30: 60 mg via INTRAMUSCULAR
  Filled 2013-11-30: qty 2

## 2013-11-30 MED ORDER — CLINDAMYCIN HCL 150 MG PO CAPS
300.0000 mg | ORAL_CAPSULE | Freq: Once | ORAL | Status: AC
  Administered 2013-11-30: 300 mg via ORAL
  Filled 2013-11-30: qty 2

## 2013-11-30 MED ORDER — CLINDAMYCIN HCL 150 MG PO CAPS: 150.0000 mg | ORAL_CAPSULE | Freq: Four times a day (QID) | ORAL | Status: DC

## 2013-11-30 MED ORDER — ASPIRIN-ACETAMINOPHEN-CAFFEINE 250-250-65 MG PO TABS
2.0000 | ORAL_TABLET | Freq: Once | ORAL | Status: DC
  Filled 2013-11-30: qty 2

## 2013-11-30 MED ORDER — ASPIRIN-ACETAMINOPHEN-CAFFEINE 250-250-65 MG PO TABS: 2.0000 | ORAL_TABLET | Freq: Four times a day (QID) | ORAL | Status: DC | PRN

## 2013-11-30 NOTE — ED Notes (Signed)
Pt requesting to speak with PA

## 2013-11-30 NOTE — ED Notes (Signed)
Left jaw pain/ swelling x 3 days makes her head hurt and left big toe pain x 2 weeks
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
## 2013-11-30 NOTE — ED Notes (Signed)
Pt refused Excedrin.

## 2013-11-30 NOTE — ED Notes (Signed)
PA spoke with pt

## 2013-11-30 NOTE — Discharge Instructions (Signed)
Abscess °An abscess (boil or furuncle) is an infected area on or under the skin. This area is filled with yellowish-white fluid (pus) and other material (debris). °HOME CARE  °· Only take medicines as told by your doctor. °· If you were given antibiotic medicine, take it as directed. Finish the medicine even if you start to feel better. °· If gauze is used, follow your doctor's directions for changing the gauze. °· To avoid spreading the infection: °¨ Keep your abscess covered with a bandage. °¨ Wash your hands well. °¨ Do not share personal care items, towels, or whirlpools with others. °¨ Avoid skin contact with others. °· Keep your skin and clothes clean around the abscess. °· Keep all doctor visits as told. °GET HELP RIGHT AWAY IF:  °· You have more pain, puffiness (swelling), or redness in the wound site. °· You have more fluid or blood coming from the wound site. °· You have muscle aches, chills, or you feel sick. °· You have a fever. °MAKE SURE YOU:  °· Understand these instructions. °· Will watch your condition. °· Will get help right away if you are not doing well or get worse. °Document Released: 06/25/2007 Document Revised: 07/08/2011 Document Reviewed: 03/21/2011 °ExitCare® Patient Information ©2015 [employer]. This information is not intended to replace advice given to you by your health care provider. Make sure you discuss any questions you have with your health care provider. ° °

## 2014-01-02 ENCOUNTER — Ambulatory Visit: Payer: Self-pay

## 2014-01-02 ENCOUNTER — Encounter: Payer: Self-pay | Admitting: Podiatry

## 2014-01-02 DIAGNOSIS — M79672 Pain in left foot: Secondary | ICD-10-CM

## 2014-01-06 ENCOUNTER — Encounter: Payer: Self-pay | Admitting: Podiatry

## 2014-01-06 ENCOUNTER — Ambulatory Visit (INDEPENDENT_AMBULATORY_CARE_PROVIDER_SITE_OTHER): Payer: Medicaid Other | Admitting: Podiatry

## 2014-01-06 ENCOUNTER — Ambulatory Visit (INDEPENDENT_AMBULATORY_CARE_PROVIDER_SITE_OTHER): Payer: Medicaid Other

## 2014-01-06 VITALS — BP 108/65 | HR 75 | Resp 16 | Ht 70.0 in | Wt 242.0 lb

## 2014-01-06 DIAGNOSIS — S92912A Unspecified fracture of left toe(s), initial encounter for closed fracture: Secondary | ICD-10-CM

## 2014-01-06 DIAGNOSIS — M25572 Pain in left ankle and joints of left foot: Secondary | ICD-10-CM

## 2014-01-06 DIAGNOSIS — M84378P Stress fracture, left toe(s), subsequent encounter for fracture with malunion: Secondary | ICD-10-CM | POA: Diagnosis not present

## 2014-01-06 DIAGNOSIS — S93402A Sprain of unspecified ligament of left ankle, initial encounter: Secondary | ICD-10-CM
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
## 2014-01-19 NOTE — Progress Notes (Signed)
This encounter was created in error - please disregard.

## 2014-01-27 ENCOUNTER — Ambulatory Visit (INDEPENDENT_AMBULATORY_CARE_PROVIDER_SITE_OTHER): Payer: Medicaid Other | Admitting: Podiatry

## 2014-01-27 ENCOUNTER — Encounter: Payer: Self-pay | Admitting: Podiatry

## 2014-01-27 ENCOUNTER — Ambulatory Visit (INDEPENDENT_AMBULATORY_CARE_PROVIDER_SITE_OTHER): Payer: Medicaid Other

## 2014-01-27 VITALS — BP 102/67 | HR 89 | Resp 11

## 2014-01-27 DIAGNOSIS — M79675 Pain in left toe(s): Secondary | ICD-10-CM

## 2014-01-27 DIAGNOSIS — B351 Tinea unguium: Secondary | ICD-10-CM

## 2014-01-27 DIAGNOSIS — M79605 Pain in left leg: Secondary | ICD-10-CM | POA: Diagnosis not present

## 2014-01-30 NOTE — Progress Notes (Signed)
Patient ID: Jacqueline Clarke, female   DOB: September 20, 1985, 29 y.o.   MRN: [id number]  Subjective: 29 year old female returns the office they for follow-up evaluation of left hallux fracture and left ankle pain. She states that since last appointment she no longer has any pain to her ankle and the pain to her big toe has decreased. She has been continuing with a cam walker. She does state that the her leg is bruised. Denies any recent injury or trauma. No other complaints at this time in no acute changes since last appointment. Denies any systemic complaints such as fevers, chills, nausea, vomiting. Denies any calf pain, chest pain, shortness of breath.  Objective: AAO x3, NAD DP/PT pulses palpable bilaterally, CRT less than 3 seconds Protective sensation intact with Simms Weinstein monofilament, vibratory sensation intact, Achilles tendon reflex intact Mild tenderness to palpation over the distal aspect of the left hallux. There is no pain with range of motion of the MTPJ.  There is no tenderness to palpation overlying the medial malleolus, lateral malleolus proximal fibula/tibia. There is no pain along the lateral ankle ligaments, medial ankle ligaments or syndesmosis. Ankle joint range of motion is within normal limits. Is no other areas of pinpoint bony tenderness or pain with vibratory sensation bilaterally. There is ecchymosis along the proximal leg distal to the knee. There is mild pain along this area however is no specific pinpoint bony tenderness or pain with vibratory sensation. No open lesions or pre-ulcerative lesions identified. No pain with calf compression, swelling, warmth, erythema.  Assessment: 29 year old female follow-up evaluation left hallux fracture; ankle pain resolved  Plan: -X-rays were obtained and reviewed with the patient. -Due to the proximal leg ecchymosis we'll obtain tibia/fibula x-rays at the hospital. An order was placed for this. -Until the x-ray results are  obtained continue with the Cam Walker. -Continue ice and elevation. -Follow-up in 4 weeks or sooner should any problems arise. In the meantime, encouraged to call the office with any questions, concerns, change in symptoms.

## 2014-02-01 ENCOUNTER — Telehealth: Payer: Self-pay | Admitting: *Deleted

## 2014-02-01 NOTE — Telephone Encounter (Signed)
"  He said he was going to schedule an appointment for me to have a x-ray of my leg.  I saw him on Friday.  He said if I haven't heard anything from anyone to give you a call.  I haven't heard anything."  I see the order, I will send it over to them.  Someone should give you a call.  "Okay, thank you."  I faxed the order to [facility].

## 2014-02-03 ENCOUNTER — Emergency Department (HOSPITAL_COMMUNITY): Payer: Medicaid Other

## 2014-02-03 ENCOUNTER — Encounter (HOSPITAL_COMMUNITY): Payer: Self-pay | Admitting: *Deleted

## 2014-02-03 ENCOUNTER — Telehealth: Payer: Self-pay | Admitting: *Deleted

## 2014-02-03 ENCOUNTER — Emergency Department (HOSPITAL_COMMUNITY)
Admission: EM | Admit: 2014-02-03 | Discharge: 2014-02-03 | Disposition: A | Discharge status: Home / Self Care | Payer: Medicaid Other | Attending: Emergency Medicine | Admitting: Emergency Medicine

## 2014-02-03 DIAGNOSIS — Z8614 Personal history of Methicillin resistant Staphylococcus aureus infection: Secondary | ICD-10-CM | POA: Diagnosis not present

## 2014-02-03 DIAGNOSIS — Z79899 Other long term (current) drug therapy: Secondary | ICD-10-CM | POA: Insufficient documentation

## 2014-02-03 DIAGNOSIS — J45901 Unspecified asthma with (acute) exacerbation: Secondary | ICD-10-CM | POA: Diagnosis not present

## 2014-02-03 DIAGNOSIS — S99921A Unspecified injury of right foot, initial encounter: Secondary | ICD-10-CM | POA: Diagnosis present

## 2014-02-03 DIAGNOSIS — Y9289 Other specified places as the place of occurrence of the external cause: Secondary | ICD-10-CM | POA: Diagnosis not present

## 2014-02-03 DIAGNOSIS — Z3202 Encounter for pregnancy test, result negative: Secondary | ICD-10-CM | POA: Insufficient documentation

## 2014-02-03 DIAGNOSIS — W208XXA Other cause of strike by thrown, projected or falling object, initial encounter: Secondary | ICD-10-CM | POA: Insufficient documentation

## 2014-02-03 DIAGNOSIS — S90211A Contusion of right great toe with damage to nail, initial encounter: Secondary | ICD-10-CM | POA: Insufficient documentation

## 2014-02-03 DIAGNOSIS — G8929 Other chronic pain: Secondary | ICD-10-CM | POA: Diagnosis not present

## 2014-02-03 DIAGNOSIS — E669 Obesity, unspecified: Secondary | ICD-10-CM | POA: Diagnosis not present

## 2014-02-03 DIAGNOSIS — Y9389 Activity, other specified: Secondary | ICD-10-CM | POA: Insufficient documentation

## 2014-02-03 DIAGNOSIS — Z8719 Personal history of other diseases of the digestive system: Secondary | ICD-10-CM | POA: Diagnosis not present

## 2014-02-03 DIAGNOSIS — R0602 Shortness of breath: Secondary | ICD-10-CM

## 2014-02-03 DIAGNOSIS — Z720 Tobacco use: Secondary | ICD-10-CM | POA: Insufficient documentation

## 2014-02-03 DIAGNOSIS — Y998 Other external cause status: Secondary | ICD-10-CM | POA: Insufficient documentation

## 2014-02-03 DIAGNOSIS — S90121A Contusion of right lesser toe(s) without damage to nail, initial encounter: Secondary | ICD-10-CM

## 2014-02-03 DIAGNOSIS — Z862 Personal history of diseases of the blood and blood-forming organs and certain disorders involving the immune mechanism: Secondary | ICD-10-CM | POA: Diagnosis not present

## 2014-02-03 DIAGNOSIS — M419 Scoliosis, unspecified: Secondary | ICD-10-CM | POA: Diagnosis not present

## 2014-02-03 DIAGNOSIS — J40 Bronchitis, not specified as acute or chronic: Secondary | ICD-10-CM

## 2014-02-03 LAB — URINE MICROSCOPIC-ADD ON

## 2014-02-03 LAB — URINALYSIS, ROUTINE W REFLEX MICROSCOPIC
Bilirubin Urine: NEGATIVE
Glucose, UA: NEGATIVE mg/dL
Hgb urine dipstick: NEGATIVE
KETONES UR: NEGATIVE mg/dL
NITRITE: NEGATIVE
Protein, ur: NEGATIVE mg/dL
SPECIFIC GRAVITY, URINE: 1.03 (ref 1.005–1.030)
UROBILINOGEN UA: 1 mg/dL (ref 0.0–1.0)
pH: 5.5 (ref 5.0–8.0)

## 2014-02-03 LAB — PREGNANCY, URINE: Preg Test, Ur: NEGATIVE

## 2014-02-03 MED ORDER — HYDROCODONE-HOMATROPINE 5-1.5 MG PO TABS: 1.0000 | ORAL_TABLET | Freq: Four times a day (QID) | ORAL | Status: DC | PRN

## 2014-02-03 MED ORDER — IPRATROPIUM-ALBUTEROL 0.5-2.5 (3) MG/3ML IN SOLN
3.0000 mL | Freq: Once | RESPIRATORY_TRACT | Status: AC
  Administered 2014-02-03: 3 mL via RESPIRATORY_TRACT
  Filled 2014-02-03: qty 3

## 2014-02-03 MED ORDER — PREDNISONE 20 MG PO TABS: 40.0000 mg | ORAL_TABLET | Freq: Every day | ORAL | Status: DC

## 2014-02-03 MED ORDER — ALBUTEROL SULFATE HFA 108 (90 BASE) MCG/ACT IN AERS
1.0000 | INHALATION_SPRAY | RESPIRATORY_TRACT | Status: DC | PRN
  Administered 2014-02-03: 1 via RESPIRATORY_TRACT
  Filled 2014-02-03: qty 6.7

## 2014-02-03 NOTE — ED Provider Notes (Signed)
CSN: [id number]     Arrival date & time 02/03/14  1854 History   First MD Initiated Contact with Patient 02/03/14 2151     Chief Complaint  Patient presents with  . 2 complaints sob and tor injury      (Consider location/radiation/quality/duration/timing/severity/associated sxs/prior Treatment) Patient is a 29 y.o. female presenting with cough and toe pain. The history is provided by the patient.  Cough Cough characteristics:  Productive and hacking Sputum characteristics:  Green Severity:  Moderate Onset quality:  Gradual Duration:  1 week Timing:  Constant Progression:  Worsening Chronicity:  Recurrent Smoker: yes   Context: upper respiratory infection   Relieved by:  Nothing Worsened by:  Lying down (night) Ineffective treatments:  None tried Associated symptoms: headaches, shortness of breath and wheezing   Associated symptoms: no fever, no sinus congestion and no sore throat   Associated symptoms comment:  Chest pain from all the coughing Risk factors: no recent infection and no recent travel   Toe Pain This is a new (she was moving a drawer and dropped it on her toe tonight with severe pain in the right great toe) problem. The current episode started 12 to 24 hours ago. The problem occurs constantly. The problem has not changed since onset.Associated symptoms include headaches and shortness of breath. The symptoms are aggravated by walking. The symptoms are relieved by rest. She has tried nothing for the symptoms.    Past Medical History  Diagnosis Date  . Asthma   . Scoliosis   . Blood type, Rh negative   . Pregnant state, incidental   . Anemia   . Chronic back pain     scolosis- on percocet  . Hyperemesis complicating pregnancy, antepartum     Zofran PRN  . Headache(784.0)   . Cholelithiasis   . Pregnancy induced hypertension   . Hypothyroidism   . 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 Obesity    Past Surgical History  Procedure Laterality Date    . Cholecystectomy    . Adenoidectomy    . Dilation and curettage of uterus  2008   Family History  Problem Relation Age of Onset  . Heart disease Maternal Grandfather   . Diabetes Maternal Grandfather   . Anesthesia problems Neg Hx    History  Substance Use Topics  . Smoking status: Current Every Day Smoker -- 0.50 packs/day    Types: Cigarettes    Last Attempt to Quit: 11/17/2010  . Smokeless tobacco: Never Used  . Alcohol Use: 0.0 oz/week    0 Not specified per week   OB History    Gravida Para Term Preterm AB TAB SAB Ectopic Multiple Living   5 3 3  0 2 0 2 0 0 3     Review of Systems  Constitutional: Negative for fever.  HENT: Negative for sore throat.   Respiratory: Positive for cough, shortness of breath and wheezing.   Neurological: Positive for headaches.  All other systems reviewed and are negative.     Allergies  Review of patient's allergies indicates no known allergies.  Home Medications   Prior to Admission medications   Medication Sig Start Date End Date Taking? Authorizing Provider  QUEtiapine (SEROQUEL) 100 MG tablet Take 100 mg by mouth at bedtime.    Historical Provider, MD  venlafaxine (EFFEXOR) 75 MG tablet Take 75 mg by mouth 2 (two) times daily.    Historical Provider, MD   BP 104/53 mmHg  Pulse 89  Temp(Src) 98.8 F (37.1 C) (Oral)  Resp 23  Ht  (1.778 m)  Wt 244 lb (110.678 kg)  BMI 35.01 kg/m2  SpO2 100%  LMP 12/27/2013 Physical Exam  Constitutional: She is oriented to person, place, and time. She appears well-developed and well-nourished. No distress.  HENT:  Head: Normocephalic and atraumatic.  Right Ear: Tympanic membrane and ear canal normal.  Left Ear: Tympanic membrane and ear canal normal.  Eyes: EOM are normal. Pupils are equal, round, and reactive to light.  Cardiovascular: Normal rate, regular rhythm, normal heart sounds and intact distal pulses.  Exam reveals no friction rub.   No murmur heard. Pulmonary/Chest:  Effort normal. She has wheezes. She has no rales. She exhibits tenderness.  Persistent cough  Abdominal: Soft. Bowel sounds are normal. She exhibits no distension. There is no tenderness. There is no rebound and no guarding.  Musculoskeletal: Normal range of motion. She exhibits tenderness.       Feet:  No edema  Neurological: She is alert and oriented to person, place, and time. No cranial nerve deficit.  Skin: Skin is warm and dry. No rash noted.  Psychiatric: She has a normal mood and affect. Her behavior is normal.  Nursing note and vitals reviewed.   ED Course  Procedures (including critical care time) Labs Review Labs Reviewed  URINALYSIS, ROUTINE W REFLEX MICROSCOPIC - Abnormal; Notable for the following:    Leukocytes, UA SMALL (*)    All other components within normal limits  URINE MICROSCOPIC-ADD ON - Abnormal; Notable for the following:    Squamous Epithelial / LPF MANY (*)    All other components within normal limits  PREGNANCY, URINE    Imaging Review Dg Chest 2 View  02/03/2014   CLINICAL DATA:  Chest pain and shortness of breath for the past 3 days. Cough for the past week. History of asthma.  EXAM: CHEST  2 VIEW  COMPARISON:  05/12/2013.  FINDINGS: Normal sized heart. Clear lungs. Minimal scoliosis. Cholecystectomy clips.  IMPRESSION: No acute abnormality.   Electronically Signed   By: Gordan Payment M.D.   On: 02/03/2014 20:03   Dg Toe Great Right  02/03/2014   CLINICAL DATA:  Anterior right great toe swelling and bleeding after a crush injury today. A dresser drawer fell on the toe today.  EXAM: RIGHT GREAT TOE  COMPARISON:  None.  FINDINGS: Irregularity and fragmentation of the great toenail. No bone fracture or dislocation. No radiopaque foreign body.  IMPRESSION: No fracture.   Electronically Signed   By: Gordan Payment M.D.   On: 02/03/2014 20:04     EKG Interpretation None      MDM   Final diagnoses:  Bronchitis  Toe contusion, right, initial encounter     Pt with symptoms consistent with viral bronchitis.  Well appearing here but persistent coughing on exam.  No signs of breathing difficulty  No signs of pharyngitis, otitis or abnormal abdominal findings.   CXR wnl and pt to return with any further problems.  With hx of asthma will treat with prednisone and albuterol.  Secondly pt dropped a drawer on her toe with normal x-ray but partial nail avulsion.  No need for nail removal at this time.  Pt to use supportive care.     Gwyneth Sprout, MD 02/03/14 2241

## 2014-02-03 NOTE — ED Notes (Signed)
The pt is c/o sob hx of asthma and for 3 days her cough has increased and she has been coughing up green sputum.she us currently a smoker.  She also wants to be seen for her rt great toe injury today.  She slipped  lmp dec 5th

## 2014-02-03 NOTE — Discharge Instructions (Signed)
Contusion °A contusion is a deep bruise. Contusions happen when an injury causes bleeding under the skin. Signs of bruising include pain, puffiness (swelling), and discolored skin. The contusion may turn blue, purple, or yellow. °HOME CARE  °· Put ice on the injured area. °¨ Put ice in a plastic bag. °¨ Place a towel between your skin and the bag. °¨ Leave the ice on for 15-20 minutes, 03-04 times a day. °· Only take medicine as told by your doctor. °· Rest the injured area. °· If possible, raise (elevate) the injured area to lessen puffiness. °GET HELP RIGHT AWAY IF:  °· You have more bruising or puffiness. °· You have pain that is getting worse. °· Your puffiness or pain is not helped by medicine. °MAKE SURE YOU:  °· Understand these instructions. °· Will watch your condition. °· Will get help right away if you are not doing well or get worse. °Document Released: 06/25/2007 Document Revised: 03/31/2011 Document Reviewed: 11/11/2010 °ExitCare® Patient Information ©2015 [employer]. This information is not intended to replace advice given to you by your health care provider. Make sure you discuss any questions you have with your health care provider. ° °

## 2014-02-03 NOTE — Telephone Encounter (Signed)
"  I'm supposed to be having [facility] and I still haven't heard anything.  It's been over 2 weeks now."  Let me give you the direct number to call them because I sent them the orders the other day.  Their number is [phone].  "Okay, thank you."

## 2014-02-07 ENCOUNTER — Ambulatory Visit (HOSPITAL_COMMUNITY)
Admission: RE | Admit: 2014-02-07 | Discharge: 2014-02-07 | Disposition: A | Discharge status: Home / Self Care | Payer: Medicaid Other | Source: Ambulatory Visit | Attending: Podiatry | Admitting: Podiatry

## 2014-02-07 DIAGNOSIS — M7989 Other specified soft tissue disorders: Secondary | ICD-10-CM | POA: Diagnosis present

## 2014-02-07 DIAGNOSIS — M79605 Pain in left leg: Secondary | ICD-10-CM

## 2014-02-10 ENCOUNTER — Ambulatory Visit: Payer: No Typology Code available for payment source | Admitting: Podiatry

## 2014-02-15 ENCOUNTER — Encounter: Payer: Self-pay | Admitting: Podiatry

## 2014-02-15 ENCOUNTER — Ambulatory Visit (INDEPENDENT_AMBULATORY_CARE_PROVIDER_SITE_OTHER): Payer: Medicaid Other | Admitting: Podiatry

## 2014-02-15 VITALS — BP 102/65 | HR 91 | Resp 18

## 2014-02-15 DIAGNOSIS — B351 Tinea unguium: Secondary | ICD-10-CM

## 2014-02-15 DIAGNOSIS — M79675 Pain in left toe(s): Secondary | ICD-10-CM

## 2014-02-18 NOTE — Progress Notes (Signed)
Patient ID: Jacqueline Clarke, female   DOB: 23-Sep-1985, 29 y.o.   MRN: [id number]  Subjective: 29 year old female returns the office they for follow-up evaluation of left hallux fracture and left ankle pain. She states that since last appointment she no longer has any discomfort to the foot or ankle and she has transitioned herself out of the [employer] Walker and she presents today wearing a sandal. She states that she has no discomfort or swelling to the area. No other complaints at this time.   Objective: AAO x3, NAD DP/PT pulses palpable bilaterally, CRT less than 3 seconds Protective sensation intact with Simms Weinstein monofilament, vibratory sensation intact, Achilles tendon reflex intact There is no tenderness palpation overlying the hallux or other areas of the foot. There is no discomfort overlying the ankle. There is no overlying edema, erythema, increase in warmth. MMT 5/5, ROM WNL Nails are hypertrophic, dystrophic, elongated, brittle, discolored. No surrounding erythema or drainage. Pain with calf compression, swelling, warmth, erythema. No open lesions or pre-ulcerative lesions.  Assessment: 29 year old female follow-up evaluation left hallux fracture and ankle pain; onychomycosis  Plan: -X-rays that were obtained of the hospital were reviewed with the patient which were negative for fracture. -At this time as she is already transitioned into a regular shoe without difficulty and x-rays are negative for the ankle she can continue wearing her regular shoe as tolerated. Discussed the foot x-rays with her. -If she has any reoccurrence of discomfort to remain in the cam walker and call the office. -Discussed the treatment options for onychomycosis with the patient. This time she has elected to proceed with over-the-counter fungi nail. As her nails are very thick she most likely benefit more from an oral medication however she wishes to hold off at this time. -Follow-up as needed. In  the meantime, encouraged to call the office with any questions, concerns, change in symptoms.

## 2014-04-19 DIAGNOSIS — R52 Pain, unspecified: Secondary | ICD-10-CM

## 2014-06-18 ENCOUNTER — Emergency Department (HOSPITAL_COMMUNITY): Payer: Medicaid Other | Admitting: Anesthesiology

## 2014-06-18 ENCOUNTER — Encounter (HOSPITAL_COMMUNITY)
Admission: EM | Disposition: A | Discharge status: Home / Self Care | Payer: Self-pay | Source: Home / Self Care | Attending: Emergency Medicine

## 2014-06-18 ENCOUNTER — Ambulatory Visit (HOSPITAL_COMMUNITY)
Admission: EM | Admit: 2014-06-18 | Discharge: 2014-06-18 | Disposition: A | Discharge status: Home / Self Care | Payer: Medicaid Other | Attending: Emergency Medicine | Admitting: Emergency Medicine

## 2014-06-18 ENCOUNTER — Emergency Department (HOSPITAL_COMMUNITY): Payer: Medicaid Other

## 2014-06-18 ENCOUNTER — Encounter (HOSPITAL_COMMUNITY): Payer: Self-pay

## 2014-06-18 DIAGNOSIS — X58XXXA Exposure to other specified factors, initial encounter: Secondary | ICD-10-CM | POA: Insufficient documentation

## 2014-06-18 DIAGNOSIS — S61511A Laceration without foreign body of right wrist, initial encounter: Secondary | ICD-10-CM | POA: Diagnosis not present

## 2014-06-18 DIAGNOSIS — S45909A Unspecified injury of unspecified blood vessel at shoulder and upper arm level, unspecified arm, initial encounter: Secondary | ICD-10-CM

## 2014-06-18 DIAGNOSIS — S51811A Laceration without foreign body of right forearm, initial encounter: Secondary | ICD-10-CM | POA: Insufficient documentation

## 2014-06-18 HISTORY — PX: NERVE, TENDON AND ARTERY REPAIR: SHX5695

## 2014-06-18 LAB — I-STAT CHEM 8, ED
BUN: 19 mg/dL (ref 6–20)
BUN: 19 mg/dL (ref 6–20)
CALCIUM ION: 1.1 mmol/L — AB (ref 1.12–1.23)
CALCIUM ION: 1.01 mmol/L — AB (ref 1.12–1.23)
CREATININE: 0.9 mg/dL (ref 0.44–1.00)
Chloride: 109 mmol/L (ref 101–111)
Chloride: 112 mmol/L — ABNORMAL HIGH (ref 101–111)
Creatinine, Ser: 1 mg/dL (ref 0.44–1.00)
Glucose, Bld: 126 mg/dL — ABNORMAL HIGH (ref 65–99)
Glucose, Bld: 116 mg/dL — ABNORMAL HIGH (ref 65–99)
HCT: 34 % — ABNORMAL LOW (ref 36.0–46.0)
HEMATOCRIT: 32 % — AB (ref 36.0–46.0)
HEMOGLOBIN: 11.6 g/dL — AB (ref 12.0–15.0)
HEMOGLOBIN: 10.9 g/dL — AB (ref 12.0–15.0)
Potassium: 3.3 mmol/L — ABNORMAL LOW (ref 3.5–5.1)
Potassium: 3.8 mmol/L (ref 3.5–5.1)
Sodium: 142 mmol/L (ref 135–145)
Sodium: 139 mmol/L (ref 135–145)
TCO2: 16 mmol/L (ref 0–100)
TCO2: 11 mmol/L (ref 0–100)

## 2014-06-18 LAB — CBC WITH DIFFERENTIAL/PLATELET
BASOS ABS: 0 10*3/uL (ref 0.0–0.1)
Basophils Relative: 0 % (ref 0–1)
Eosinophils Absolute: 0.2 10*3/uL (ref 0.0–0.7)
Eosinophils Relative: 2 % (ref 0–5)
HCT: 31.8 % — ABNORMAL LOW (ref 36.0–46.0)
Hemoglobin: 10.1 g/dL — ABNORMAL LOW (ref 12.0–15.0)
LYMPHS ABS: 2.9 10*3/uL (ref 0.7–4.0)
Lymphocytes Relative: 40 % (ref 12–46)
MCH: 26.5 pg (ref 26.0–34.0)
MCHC: 31.8 g/dL (ref 30.0–36.0)
MCV: 83.5 fL (ref 78.0–100.0)
MONO ABS: 0.5 10*3/uL (ref 0.1–1.0)
Monocytes Relative: 7 % (ref 3–12)
NEUTROS ABS: 3.7 10*3/uL (ref 1.7–7.7)
NEUTROS PCT: 51 % (ref 43–77)
Platelets: 199 10*3/uL (ref 150–400)
RBC: 3.81 MIL/uL — ABNORMAL LOW (ref 3.87–5.11)
RDW: 14.8 % (ref 11.5–15.5)
WBC: 7.2 10*3/uL (ref 4.0–10.5)

## 2014-06-18 LAB — COMPREHENSIVE METABOLIC PANEL
ALK PHOS: 56 U/L (ref 38–126)
ALT: 13 U/L — ABNORMAL LOW (ref 14–54)
AST: 18 U/L (ref 15–41)
Albumin: 2.9 g/dL — ABNORMAL LOW (ref 3.5–5.0)
Anion gap: 10 (ref 5–15)
BUN: 18 mg/dL (ref 6–20)
CALCIUM: 8.1 mg/dL — AB (ref 8.9–10.3)
CO2: 19 mmol/L — ABNORMAL LOW (ref 22–32)
Chloride: 111 mmol/L (ref 101–111)
Creatinine, Ser: 0.89 mg/dL (ref 0.44–1.00)
GFR calc Af Amer: >60 mL/min (ref >60)
GFR calc non Af Amer: >60 mL/min (ref >60)
GLUCOSE: 126 mg/dL — AB (ref 65–99)
POTASSIUM: 3.3 mmol/L — AB (ref 3.5–5.1)
Sodium: 140 mmol/L (ref 135–145)
Total Bilirubin: 0.3 mg/dL (ref 0.3–1.2)
Total Protein: 5.8 g/dL — ABNORMAL LOW (ref 6.5–8.1)

## 2014-06-18 LAB — I-STAT BETA HCG BLOOD, ED (MC, WL, AP ONLY): I-stat hCG, quantitative: <5 m[IU]/mL (ref <5)

## 2014-06-18 LAB — URINALYSIS, ROUTINE W REFLEX MICROSCOPIC
Bilirubin Urine: NEGATIVE
Glucose, UA: NEGATIVE mg/dL
Ketones, ur: NEGATIVE mg/dL
Nitrite: NEGATIVE
PROTEIN: NEGATIVE mg/dL
SPECIFIC GRAVITY, URINE: 1.024 (ref 1.005–1.030)
Urobilinogen, UA: 0.2 mg/dL (ref 0.0–1.0)
pH: 5.5 (ref 5.0–8.0)

## 2014-06-18 LAB — ACETAMINOPHEN LEVEL: Acetaminophen (Tylenol), Serum: <10 ug/mL — ABNORMAL LOW (ref 10–30)

## 2014-06-18 LAB — URINE MICROSCOPIC-ADD ON

## 2014-06-18 LAB — RAPID URINE DRUG SCREEN, HOSP PERFORMED
Amphetamines: NOT DETECTED
BARBITURATES: NOT DETECTED
Benzodiazepines: NOT DETECTED
COCAINE: NOT DETECTED
Opiates: NOT DETECTED
Tetrahydrocannabinol: NOT DETECTED

## 2014-06-18 LAB — SALICYLATE LEVEL: Salicylate Lvl: <4 mg/dL (ref 2.8–30.0)

## 2014-06-18 LAB — ETHANOL: ALCOHOL ETHYL (B): 68 mg/dL — AB (ref <5)

## 2014-06-18 SURGERY — NERVE, TENDON AND ARTERY REPAIR
Anesthesia: General | Site: Wrist | Laterality: Right

## 2014-06-18 SURGERY — Surgical Case
Anesthesia: *Unknown

## 2014-06-18 MED ORDER — LORAZEPAM 2 MG/ML IJ SOLN
INTRAMUSCULAR | Status: AC
Start: 2014-06-18 — End: 2014-06-18
  Administered 2014-06-18: 1 mg
  Filled 2014-06-18: qty 1

## 2014-06-18 MED ORDER — SUCCINYLCHOLINE CHLORIDE 20 MG/ML IJ SOLN
INTRAMUSCULAR | Status: DC | PRN
  Administered 2014-06-18: 100 mg via INTRAVENOUS

## 2014-06-18 MED ORDER — 0.9 % SODIUM CHLORIDE (POUR BTL) OPTIME
TOPICAL | Status: DC | PRN
  Administered 2014-06-18: 1000 mL

## 2014-06-18 MED ORDER — DEXAMETHASONE SODIUM PHOSPHATE 4 MG/ML IJ SOLN
INTRAMUSCULAR | Status: DC | PRN
  Administered 2014-06-18: 4 mg via INTRAVENOUS

## 2014-06-18 MED ORDER — SODIUM CHLORIDE 0.9 % IV BOLUS (SEPSIS)
1000.0000 mL | Freq: Once | INTRAVENOUS | Status: AC
  Administered 2014-06-18: 1000 mL via INTRAVENOUS

## 2014-06-18 MED ORDER — FENTANYL CITRATE (PF) 250 MCG/5ML IJ SOLN
INTRAMUSCULAR | Status: AC
  Filled 2014-06-18: qty 5

## 2014-06-18 MED ORDER — EPHEDRINE SULFATE 50 MG/ML IJ SOLN
INTRAMUSCULAR | Status: AC
  Filled 2014-06-18: qty 1

## 2014-06-18 MED ORDER — CEFAZOLIN SODIUM 1-5 GM-% IV SOLN
1.0000 g | Freq: Once | INTRAVENOUS | Status: AC
  Administered 2014-06-18: 1 g via INTRAVENOUS
  Filled 2014-06-18: qty 50

## 2014-06-18 MED ORDER — IOHEXOL 350 MG/ML SOLN: 100.0000 mL | Freq: Once | INTRAVENOUS | Status: DC | PRN

## 2014-06-18 MED ORDER — CEFAZOLIN SODIUM-DEXTROSE 2-3 GM-% IV SOLR
INTRAVENOUS | Status: AC
  Administered 2014-06-18: 2 g via INTRAVENOUS
  Filled 2014-06-18: qty 50

## 2014-06-18 MED ORDER — ASPIRIN EC 325 MG PO TBEC: 325.0000 mg | DELAYED_RELEASE_TABLET | Freq: Every day | ORAL | Status: DC

## 2014-06-18 MED ORDER — DEXAMETHASONE SODIUM PHOSPHATE 4 MG/ML IJ SOLN
INTRAMUSCULAR | Status: AC
  Filled 2014-06-18: qty 2

## 2014-06-18 MED ORDER — DOCUSATE SODIUM 100 MG PO CAPS: 100.0000 mg | ORAL_CAPSULE | Freq: Two times a day (BID) | ORAL | Status: DC

## 2014-06-18 MED ORDER — OXYCODONE-ACETAMINOPHEN 5-325 MG PO TABS: 1.0000 | ORAL_TABLET | ORAL | Status: DC | PRN

## 2014-06-18 MED ORDER — LIDOCAINE HCL (CARDIAC) 20 MG/ML IV SOLN
INTRAVENOUS | Status: DC | PRN
  Administered 2014-06-18: 60 mg via INTRAVENOUS

## 2014-06-18 MED ORDER — TETANUS-DIPHTH-ACELL PERTUSSIS 5-2.5-18.5 LF-MCG/0.5 IM SUSP
0.5000 mL | Freq: Once | INTRAMUSCULAR | Status: AC
  Administered 2014-06-18: 0.5 mL via INTRAMUSCULAR
  Filled 2014-06-18: qty 0.5

## 2014-06-18 MED ORDER — LORAZEPAM 2 MG/ML IJ SOLN
1.0000 mg | Freq: Once | INTRAMUSCULAR | Status: AC
  Administered 2014-06-18: 1 mg via INTRAVENOUS

## 2014-06-18 MED ORDER — SUCCINYLCHOLINE CHLORIDE 20 MG/ML IJ SOLN
INTRAMUSCULAR | Status: AC
  Filled 2014-06-18: qty 2

## 2014-06-18 MED ORDER — HYDROMORPHONE HCL 1 MG/ML IJ SOLN: 0.2500 mg | INTRAMUSCULAR | Status: DC | PRN

## 2014-06-18 MED ORDER — LACTATED RINGERS IV SOLN
INTRAVENOUS | Status: DC | PRN
  Administered 2014-06-18 (×2): via INTRAVENOUS

## 2014-06-18 MED ORDER — ONDANSETRON HCL 4 MG/2ML IJ SOLN
INTRAMUSCULAR | Status: DC | PRN
  Administered 2014-06-18: 4 mg via INTRAVENOUS

## 2014-06-18 MED ORDER — ATROPINE SULFATE 0.1 MG/ML IJ SOLN
INTRAMUSCULAR | Status: AC
  Filled 2014-06-18: qty 10

## 2014-06-18 MED ORDER — LIDOCAINE HCL (CARDIAC) 20 MG/ML IV SOLN
INTRAVENOUS | Status: AC
  Filled 2014-06-18: qty 20

## 2014-06-18 MED ORDER — SODIUM CHLORIDE 0.9 % IR SOLN
Status: DC | PRN
  Administered 2014-06-18: 12:00:00

## 2014-06-18 MED ORDER — PROPOFOL 10 MG/ML IV BOLUS
INTRAVENOUS | Status: DC | PRN
  Administered 2014-06-18: 130 mg via INTRAVENOUS

## 2014-06-18 MED ORDER — ZIPRASIDONE MESYLATE 20 MG IM SOLR
20.0000 mg | Freq: Once | INTRAMUSCULAR | Status: AC
  Administered 2014-06-18: 20 mg via INTRAMUSCULAR

## 2014-06-18 MED ORDER — ONDANSETRON HCL 4 MG/2ML IJ SOLN
INTRAMUSCULAR | Status: AC
  Filled 2014-06-18: qty 6

## 2014-06-18 MED ORDER — PROTAMINE SULFATE 10 MG/ML IV SOLN
INTRAVENOUS | Status: AC
  Filled 2014-06-18: qty 10

## 2014-06-18 MED ORDER — FENTANYL CITRATE (PF) 100 MCG/2ML IJ SOLN
INTRAMUSCULAR | Status: DC | PRN
  Administered 2014-06-18 (×3): 25 ug via INTRAVENOUS
  Administered 2014-06-18 (×2): 50 ug via INTRAVENOUS
  Administered 2014-06-18: 25 ug via INTRAVENOUS

## 2014-06-18 MED ORDER — PHENYLEPHRINE 40 MCG/ML (10ML) SYRINGE FOR IV PUSH (FOR BLOOD PRESSURE SUPPORT)
PREFILLED_SYRINGE | INTRAVENOUS | Status: AC
  Filled 2014-06-18: qty 10

## 2014-06-18 SURGICAL SUPPLY — 57 items
APPLIER CLIP 9.375 SM OPEN (CLIP)
APR CLP SM 9.3 20 MLT OPN (CLIP)
BAG DECANTER FOR FLEXI CONT (MISCELLANEOUS) ×3 IMPLANT
BANDAGE ELASTIC 3 VELCRO ST LF (GAUZE/BANDAGES/DRESSINGS) ×2 IMPLANT
BANDAGE ELASTIC 4 VELCRO ST LF (GAUZE/BANDAGES/DRESSINGS) ×2 IMPLANT
BNDG CMPR 9X4 STRL LF SNTH (GAUZE/BANDAGES/DRESSINGS) ×1
BNDG ESMARK 4X9 LF (GAUZE/BANDAGES/DRESSINGS) ×3 IMPLANT
BNDG GAUZE ELAST 4 BULKY (GAUZE/BANDAGES/DRESSINGS) ×2 IMPLANT
CLIP APPLIE 9.375 SM OPEN (CLIP) IMPLANT
CORDS BIPOLAR (ELECTRODE) ×3 IMPLANT
COVER SURGICAL LIGHT HANDLE (MISCELLANEOUS) ×3 IMPLANT
CUFF TOURNIQUET SINGLE 18IN (TOURNIQUET CUFF) ×3 IMPLANT
CUFF TOURNIQUET SINGLE 24IN (TOURNIQUET CUFF) IMPLANT
DRAPE OEC MINIVIEW 54X84 (DRAPES) IMPLANT
DRAPE SURG 17X23 STRL (DRAPES) ×3 IMPLANT
DRSG ADAPTIC 3X8 NADH LF (GAUZE/BANDAGES/DRESSINGS) IMPLANT
GAUZE SPONGE 4X4 12PLY STRL (GAUZE/BANDAGES/DRESSINGS) ×2 IMPLANT
GAUZE XEROFORM 5X9 LF (GAUZE/BANDAGES/DRESSINGS) ×3 IMPLANT
GEL ULTRASOUND 20GR AQUASONIC (MISCELLANEOUS) IMPLANT
GLOVE BIOGEL PI IND STRL 8.5 (GLOVE) ×1 IMPLANT
GLOVE BIOGEL PI INDICATOR 8.5 (GLOVE) ×2
GLOVE SURG ORTHO 8.0 STRL STRW (GLOVE) ×3 IMPLANT
GOWN STRL REUS W/ TWL LRG LVL3 (GOWN DISPOSABLE) ×2 IMPLANT
GOWN STRL REUS W/ TWL XL LVL3 (GOWN DISPOSABLE) ×1 IMPLANT
GOWN STRL REUS W/TWL LRG LVL3 (GOWN DISPOSABLE) ×6
GOWN STRL REUS W/TWL XL LVL3 (GOWN DISPOSABLE) ×3
KIT BASIN OR (CUSTOM PROCEDURE TRAY) ×3 IMPLANT
KIT ROOM TURNOVER OR (KITS) ×3 IMPLANT
LOOP VESSEL MAXI BLUE (MISCELLANEOUS) IMPLANT
MANIFOLD NEPTUNE II (INSTRUMENTS) ×1 IMPLANT
NDL HYPO 25GX1X1/2 BEV (NEEDLE) IMPLANT
NEEDLE HYPO 25GX1X1/2 BEV (NEEDLE) IMPLANT
NS IRRIG 1000ML POUR BTL (IV SOLUTION) ×3 IMPLANT
PACK ORTHO EXTREMITY (CUSTOM PROCEDURE TRAY) ×3 IMPLANT
PAD ARMBOARD 7.5X6 YLW CONV (MISCELLANEOUS) ×6 IMPLANT
PAD CAST 4YDX4 CTTN HI CHSV (CAST SUPPLIES) IMPLANT
PADDING CAST COTTON 4X4 STRL (CAST SUPPLIES)
SOAP 2 % CHG 4 OZ (WOUND CARE) ×3 IMPLANT
SPEAR EYE SURG WECK-CEL (MISCELLANEOUS) ×4 IMPLANT
SPLINT FIBERGLASS 3X35 (CAST SUPPLIES) ×2 IMPLANT
SUT FIBERWIRE 3-0 18 TAPR NDL (SUTURE) ×6
SUT FIBERWIRE 4-0 18 DIAM BLUE (SUTURE)
SUT MERSILENE 4 0 P 3 (SUTURE) IMPLANT
SUT PROLENE 4 0 PS 2 18 (SUTURE) IMPLANT
SUT PROLENE 7 0 DA (SUTURE) ×2 IMPLANT
SUT VIC AB 2-0 FS1 27 (SUTURE) ×2 IMPLANT
SUT VICRYL RAPIDE 4/0 PS 2 (SUTURE) ×6 IMPLANT
SUTURE FIBERWR 3-0 18 TAPR NDL (SUTURE) IMPLANT
SUTURE FIBERWR 4-0 18 DIA BLUE (SUTURE) IMPLANT
SYR CONTROL 10ML LL (SYRINGE) IMPLANT
TOWEL OR 17X24 6PK STRL BLUE (TOWEL DISPOSABLE) ×3 IMPLANT
TOWEL OR 17X26 10 PK STRL BLUE (TOWEL DISPOSABLE) ×3 IMPLANT
TUBE CONNECTING 12'X1/4 (SUCTIONS) ×1
TUBE CONNECTING 12X1/4 (SUCTIONS) ×1 IMPLANT
UNDERPAD 30X30 INCONTINENT (UNDERPADS AND DIAPERS) ×3 IMPLANT
WATER STERILE IRR 1000ML POUR (IV SOLUTION) ×3 IMPLANT
YANKAUER SUCT BULB TIP NO VENT (SUCTIONS) ×2 IMPLANT
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
## 2014-06-18 NOTE — Anesthesia Postprocedure Evaluation (Signed)
  Anesthesia Post-op Note  Patient: Jacqueline Clarke  Procedure(s) Performed: Procedure(s): NERVE, TENDON AND ARTERY REPAIR (Right)  Patient Location: PACU  Anesthesia Type:General  Level of Consciousness: awake and alert   Airway and Oxygen Therapy: Patient Spontanous Breathing  Post-op Pain: mild  Post-op Assessment: Post-op Vital signs reviewed, Patient's Cardiovascular Status Stable and Respiratory Function Stable  Post-op Vital Signs: Reviewed  Filed Vitals:   06/18/14 1400  BP: 112/72  Pulse: 73  Temp: 36.7 C  Resp: 18    Complications: No apparent anesthesia complications

## 2014-06-18 NOTE — Brief Op Note (Signed)
06/18/2014  10:58 AM  PATIENT:  Jacqueline Clarke  29 y.o. female  PRE-OPERATIVE DIAGNOSIS:  /  POST-OPERATIVE DIAGNOSIS:  * No post-op diagnosis entered *  PROCEDURE:  Procedure(s): NERVE, TENDON AND ARTERY REPAIR (Right)  SURGEON:  Surgeon(s) and Role:    * Bradly BienenstockFred Izack Hoogland, MD - Primary  PHYSICIAN ASSISTANT:   ASSISTANTS: none   ANESTHESIA:   general  EBL:  Total I/O In: 50 [I.V.:50] Out: -   BLOOD ADMINISTERED:none  DRAINS: none   LOCAL MEDICATIONS USED:  MARCAINE     SPECIMEN:  No Specimen  DISPOSITION OF SPECIMEN:  N/A  COUNTS:  YES  TOURNIQUET:    DICTATION: .Other Dictation: Dictation Number [id number]  PLAN OF CARE: Discharge to home after PACU  PATIENT DISPOSITION:  PACU - hemodynamically stable.   Delay start of Pharmacological VTE agent (>24hrs) due to surgical blood loss or risk of bleeding: not applicable
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
## 2014-06-18 NOTE — Transfer of Care (Signed)
Immediate Anesthesia Transfer of Care Note  Patient: Jacqueline Clarke  Procedure(s) Performed: Procedure(s): NERVE, TENDON AND ARTERY REPAIR (Right)  Patient Location: PACU  Anesthesia Type:General  Level of Consciousness: sedated  Airway & Oxygen Therapy: Patient Spontanous Breathing and Patient connected to nasal cannula oxygen  Post-op Assessment: Report given to RN and Post -op Vital signs reviewed and stable  Post vital signs: Reviewed and stable  Last Vitals:  Filed Vitals:   06/18/14 1015  BP: 98/66  Pulse: 84  Temp:   Resp: 20    Complications: No apparent anesthesia complications
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
## 2014-06-18 NOTE — ED Notes (Addendum)
Ativan 1 mg ativan

## 2014-06-18 NOTE — ED Notes (Signed)
Pt amb to the bathroom. Dr. Fayrene FearingJames speaking with hand surgeon.

## 2014-06-18 NOTE — ED Notes (Signed)
Dr. James at the bedside.  

## 2014-06-18 NOTE — Anesthesia Procedure Notes (Signed)
Procedure Name: Intubation Date/Time: 06/18/2014 11:25 AM Performed by: Edmonia CaprioAUSTON, Keitra Carusone M Pre-anesthesia Checklist: Patient identified, Emergency Drugs available, Suction available, Patient being monitored and Timeout performed Patient Re-evaluated:Patient Re-evaluated prior to inductionOxygen Delivery Method: Circle system utilized Preoxygenation: Pre-oxygenation with 100% oxygen Intubation Type: IV induction, Rapid sequence and Cricoid Pressure applied Laryngoscope Size: Miller and 2 Grade View: Grade I Tube type: Oral Number of attempts: 1 Airway Equipment and Method: Stylet Placement Confirmation: ETT inserted through vocal cords under direct vision,  positive ETCO2 and breath sounds checked- equal and bilateral Secured at: 22 cm Tube secured with: Tape Dental Injury: Teeth and Oropharynx as per pre-operative assessment

## 2014-06-18 NOTE — Anesthesia Preprocedure Evaluation (Addendum)
Anesthesia Evaluation  Patient identified by MRN, date of birth, ID band Patient awake    Reviewed: Allergy & Precautions, H&P , NPO status , Patient's Chart, lab work & pertinent test results  Airway Mallampati: III  TM Distance: >3 FB Neck ROM: Full    Dental no notable dental hx. (+) Teeth Intact, Dental Advisory Given   Pulmonary Current Smoker,  breath sounds clear to auscultation  Pulmonary exam normal       Cardiovascular negative cardio ROS  Rhythm:Regular Rate:Normal     Neuro/Psych Anxiety Depression negative neurological ROS     GI/Hepatic negative GI ROS, Neg liver ROS,   Endo/Other  negative endocrine ROS  Renal/GU negative Renal ROS  negative genitourinary   Musculoskeletal   Abdominal   Peds  Hematology negative hematology ROS (+)   Anesthesia Other Findings   Reproductive/Obstetrics negative OB ROS                            Anesthesia Physical Anesthesia Plan  ASA: II and emergent  Anesthesia Plan: General   Post-op Pain Management:    Induction: Intravenous, Rapid sequence and Cricoid pressure planned  Airway Management Planned: Oral ETT  Additional Equipment:   Intra-op Plan:   Post-operative Plan: Extubation in OR  Informed Consent: I have reviewed the patients History and Physical, chart, labs and discussed the procedure including the risks, benefits and alternatives for the proposed anesthesia with the patient or authorized representative who has indicated his/her understanding and acceptance.   Dental advisory given  Plan Discussed with: CRNA  Anesthesia Plan Comments:        Anesthesia Quick Evaluation

## 2014-06-18 NOTE — ED Notes (Signed)
Ct called, ready for pt.  Told to wait until BP is at reasonable level.

## 2014-06-18 NOTE — Discharge Instructions (Signed)
KEEP BANDAGE CLEAN AND DRY °CALL OFFICE FOR F/U APPT [phone] IN 12 DAYS °KEEP HAND ELEVATED ABOVE HEART °OK TO APPLY ICE TO OPERATIVE AREA °CONTACT OFFICE IF ANY WORSENING PAIN OR CONCERNS. °

## 2014-06-18 NOTE — ED Notes (Signed)
Vital signs stable. 

## 2014-06-18 NOTE — ED Notes (Addendum)
Pt may go to OR holding room 37 at this time. Pt has not signed consent as MD has not talked with pt. Will sign consent on arrival to OR holding.

## 2014-06-18 NOTE — ED Notes (Signed)
Family at bedside. 

## 2014-06-20 ENCOUNTER — Encounter (HOSPITAL_COMMUNITY): Payer: Self-pay | Admitting: Orthopedic Surgery

## 2014-06-20 ENCOUNTER — Encounter (HOSPITAL_COMMUNITY): Payer: Self-pay | Admitting: Emergency Medicine

## 2014-06-20 DIAGNOSIS — Z79899 Other long term (current) drug therapy: Secondary | ICD-10-CM | POA: Diagnosis not present

## 2014-06-20 DIAGNOSIS — J45901 Unspecified asthma with (acute) exacerbation: Secondary | ICD-10-CM | POA: Diagnosis not present

## 2014-06-20 DIAGNOSIS — G8929 Other chronic pain: Secondary | ICD-10-CM | POA: Diagnosis not present

## 2014-06-20 DIAGNOSIS — R112 Nausea with vomiting, unspecified: Secondary | ICD-10-CM | POA: Diagnosis not present

## 2014-06-20 DIAGNOSIS — Z862 Personal history of diseases of the blood and blood-forming organs and certain disorders involving the immune mechanism: Secondary | ICD-10-CM | POA: Insufficient documentation

## 2014-06-20 DIAGNOSIS — R0789 Other chest pain: Secondary | ICD-10-CM | POA: Insufficient documentation

## 2014-06-20 DIAGNOSIS — M419 Scoliosis, unspecified: Secondary | ICD-10-CM | POA: Insufficient documentation

## 2014-06-20 DIAGNOSIS — G8918 Other acute postprocedural pain: Secondary | ICD-10-CM | POA: Insufficient documentation

## 2014-06-20 DIAGNOSIS — Z720 Tobacco use: Secondary | ICD-10-CM | POA: Diagnosis not present

## 2014-06-20 DIAGNOSIS — Z8614 Personal history of Methicillin resistant Staphylococcus aureus infection: Secondary | ICD-10-CM | POA: Diagnosis not present

## 2014-06-20 DIAGNOSIS — E669 Obesity, unspecified: Secondary | ICD-10-CM | POA: Insufficient documentation

## 2014-06-20 DIAGNOSIS — Z8719 Personal history of other diseases of the digestive system: Secondary | ICD-10-CM | POA: Diagnosis not present
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
## 2014-06-21 ENCOUNTER — Emergency Department (HOSPITAL_COMMUNITY): Payer: Medicaid Other

## 2014-06-21 ENCOUNTER — Emergency Department (HOSPITAL_COMMUNITY)
Admission: EM | Admit: 2014-06-21 | Discharge: 2014-06-21 | Disposition: A | Discharge status: Home / Self Care | Payer: Medicaid Other | Attending: Emergency Medicine | Admitting: Emergency Medicine

## 2014-06-21 DIAGNOSIS — G8918 Other acute postprocedural pain: Secondary | ICD-10-CM

## 2014-06-21 DIAGNOSIS — R0789 Other chest pain: Secondary | ICD-10-CM

## 2014-06-21 LAB — CBC WITH DIFFERENTIAL/PLATELET
BASOS ABS: 0 10*3/uL (ref 0.0–0.1)
BASOS PCT: 0 % (ref 0–1)
EOS PCT: 2 % (ref 0–5)
Eosinophils Absolute: 0.2 10*3/uL (ref 0.0–0.7)
HCT: 26.5 % — ABNORMAL LOW (ref 36.0–46.0)
Hemoglobin: 8.5 g/dL — ABNORMAL LOW (ref 12.0–15.0)
Lymphocytes Relative: 34 % (ref 12–46)
Lymphs Abs: 3.3 10*3/uL (ref 0.7–4.0)
MCH: 27 pg (ref 26.0–34.0)
MCHC: 32.1 g/dL (ref 30.0–36.0)
MCV: 84.1 fL (ref 78.0–100.0)
Monocytes Absolute: 0.7 10*3/uL (ref 0.1–1.0)
Monocytes Relative: 7 % (ref 3–12)
NEUTROS ABS: 5.3 10*3/uL (ref 1.7–7.7)
NEUTROS PCT: 57 % (ref 43–77)
PLATELETS: 221 10*3/uL (ref 150–400)
RBC: 3.15 MIL/uL — ABNORMAL LOW (ref 3.87–5.11)
RDW: 15.1 % (ref 11.5–15.5)
WBC: 9.5 10*3/uL (ref 4.0–10.5)

## 2014-06-21 LAB — BASIC METABOLIC PANEL
Anion gap: 8 (ref 5–15)
BUN: 9 mg/dL (ref 6–20)
CO2: 23 mmol/L (ref 22–32)
Calcium: 8.3 mg/dL — ABNORMAL LOW (ref 8.9–10.3)
Chloride: 107 mmol/L (ref 101–111)
Creatinine, Ser: 0.71 mg/dL (ref 0.44–1.00)
GFR calc Af Amer: >60 mL/min (ref >60)
GLUCOSE: 87 mg/dL (ref 65–99)
Potassium: 3.7 mmol/L (ref 3.5–5.1)
SODIUM: 138 mmol/L (ref 135–145)

## 2014-06-21 LAB — I-STAT TROPONIN, ED: Troponin i, poc: 0 ng/mL (ref 0.00–0.08)

## 2014-06-21 MED ORDER — HYDROMORPHONE HCL 4 MG PO TABS
4.0000 mg | ORAL_TABLET | ORAL | Status: DC | PRN
Start: 2014-06-21 — End: 2014-10-18

## 2014-06-21 MED ORDER — HYDROMORPHONE HCL 1 MG/ML IJ SOLN
1.0000 mg | Freq: Once | INTRAMUSCULAR | Status: AC
  Administered 2014-06-21: 1 mg via INTRAVENOUS
  Filled 2014-06-21: qty 1

## 2014-06-21 MED ORDER — ONDANSETRON HCL 4 MG PO TABS: 4.0000 mg | ORAL_TABLET | Freq: Four times a day (QID) | ORAL | Status: DC

## 2014-06-21 MED ORDER — ONDANSETRON HCL 4 MG/2ML IJ SOLN
4.0000 mg | Freq: Once | INTRAMUSCULAR | Status: AC
  Administered 2014-06-21: 4 mg via INTRAVENOUS
  Filled 2014-06-21: qty 2

## 2014-06-21 NOTE — ED Notes (Signed)
Ortho called and is aware of thumb spica

## 2014-06-21 NOTE — Discharge Instructions (Signed)
Pain Relief Preoperatively and Postoperatively °Being a good patient does not mean being a silent one. If you have questions, problems, or concerns about the pain you may feel after surgery, let your caregiver know. Patients have the right to assessment and management of pain. The treatment of pain after surgery is important to speed up recovery and return to normal activities. Severe pain after surgery, and the fear or anxiety associated with that pain, may cause extreme discomfort that: °· Prevents sleep. °· Decreases the ability to breathe deeply and cough. This can cause pneumonia or other upper airway infections. °· Causes your heart to beat faster and your blood pressure to be higher. °· Increases the risk for constipation and bloating. °· Decreases the ability of wounds to heal. °· May result in depression, increased anxiety, and feelings of helplessness. °Relief of pain before surgery is also important because it will lessen the pain after surgery. Patients who receive both pain relief before and after surgery experience greater pain relief than those who only receive pain relief after surgery. Let your caregiver know if you are having uncontrolled pain. This is very important. Pain after surgery is more difficult to manage if it is permitted to become severe, so prompt and adequate treatment of acute pain is necessary. °PAIN CONTROL METHODS °Your caregivers follow policies and procedures about the management of patient pain. These guidelines should be explained to you before surgery. Plans for pain control after surgery must be mutually decided upon and instituted with your full understanding and agreement. Do not be afraid to ask questions regarding the care you are receiving. There are many different ways your caregivers will attempt to control your pain, including the following methods. °As needed pain control °· You may be given pain medicine either through your intravenous (IV) tube, or as a pill or  liquid you can swallow. You will need to let your caregiver know when you are having pain. Then, your caregiver will give you the pain medicine ordered for you. °· Your pain medicine may make you constipated. If constipation occurs, drink more liquids if you can. Your caregiver may have you take a mild laxative. °IV patient-controlled analgesia pump (PCA pump) °· You can get your pain medicine through the IV tube which goes into your vein. You are able to control the amount of pain medicine that you get. The pain medicine flows in through an IV tube and is controlled by a pump. This pump gives you a set amount of pain medicine when you push the button hooked up to it. Nobody should push this button but you or someone specifically assigned by you to do so. It is set up to keep you from accidentally giving yourself too much pain medicine. You will be able to start using your pain pump in the recovery room after your surgery. This method can be helpful for most types of surgery. °· If you are still having too much pain, tell your caregiver. Also, tell your caregiver if you are feeling too sleepy or nauseous. °Continuous epidural pain control °· A thin, soft tube (catheter) is put into your back. Pain medicine flows through the catheter to lessen pain in the part of your body where the surgery is done. Continuous epidural pain control may work best for you if you are having surgery on your chest, abdomen, hip area, or legs. The epidural catheter is usually put into your back just before surgery. The catheter is left in until you can eat and take medicine by mouth. In most cases,   this may take 2 to 3 days.  Giving pain medicine through the epidural catheter may help you heal faster because:  Your bowel gets back to normal faster.  You can get back to eating sooner.  You can be up and walking sooner. Medicine that numbs the area (local anesthetic)  You may receive an injection of pain medicine near where the  pain is (local infiltration).  You may receive an injection of pain medicine near the nerve that controls the sensation to a specific part of the body (peripheral nerve block).  Medicine may be put in the spine to block pain (spinal block). Opioids  Moderate to moderately severe acute pain after surgery may respond to opioids.Opioids are narcotic pain medicine. Opioids are often combined with non-narcotic medicines to improve pain relief, diminish the risk of side effects, and reduce the chance of addiction.  If you follow your caregiver's directions about taking opioids and you do not have a history of substance abuse, your risk of becoming addicted is exceptionally small.Opioids are given for short periods of time in careful doses to prevent addiction. Other methods of pain control include:  Steroids.  Physical therapy.  Heat and cold therapy.  Compression, such as wrapping an elastic bandage around the area of pain.  Massage. These various ways of controlling pain may be used together. Combining different methods of pain control is called multimodal analgesia. Using this approach has many benefits, including being able to eat, move around, and leave the hospital sooner. Document Released: 03/29/2002 Document Revised: 03/31/2011 Document Reviewed: 04/02/2010 [facility] Patient Information 2015 [place], [place]. This information is not intended to replace advice given to you by your health care provider. Make sure you discuss any questions you have with your health care provider.   Chest Pain (Nonspecific) It is often hard to give a diagnosis for the cause of chest pain. There is always a chance that your pain could be related to something serious, such as a heart attack or a blood clot in the lungs. You need to follow up with your doctor. HOME CARE  If antibiotic medicine was given, take it as directed by your doctor. Finish the medicine even if you start to feel better.  For the  next few days, avoid activities that bring on chest pain. Continue physical activities as told by your doctor.  Do not use any tobacco products. This includes cigarettes, chewing tobacco, and e-cigarettes.  Avoid drinking alcohol.  Only take medicine as told by your doctor.  Follow your doctor's suggestions for more testing if your chest pain does not go away.  Keep all doctor visits you made. GET HELP IF:  Your chest pain does not go away, even after treatment.  You have a rash with blisters on your chest.  You have a fever. GET HELP RIGHT AWAY IF:   You have more pain or pain that spreads to your arm, neck, jaw, back, or belly (abdomen).  You have shortness of breath.  You cough more than usual or cough up blood.  You have very bad back or belly pain.  You feel sick to your stomach (nauseous) or throw up (vomit).  You have very bad weakness.  You pass out (faint).  You have chills. This is an emergency. Do not wait to see if the problems will go away. Call your local emergency services (911 in [place] Do not drive yourself to the hospital. MAKE SURE YOU:   Understand these instructions.  Will watch your  condition. °· Will get help right away if you are not doing well or get worse. °Document Released: 06/25/2007 Document Revised: 01/11/2013 Document Reviewed: 06/25/2007 °ExitCare® Patient Information ©2015 [employer]. This information is not intended to replace advice given to you by your health care provider. Make sure you discuss any questions you have with your health care provider. ° °

## 2014-06-21 NOTE — ED Notes (Signed)
Pt. Left with all belongings 
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
## 2014-06-26 DIAGNOSIS — E669 Obesity, unspecified: Secondary | ICD-10-CM | POA: Diagnosis not present

## 2014-06-26 DIAGNOSIS — R1110 Vomiting, unspecified: Secondary | ICD-10-CM | POA: Diagnosis not present

## 2014-06-26 DIAGNOSIS — J45909 Unspecified asthma, uncomplicated: Secondary | ICD-10-CM | POA: Diagnosis not present

## 2014-06-26 DIAGNOSIS — G8929 Other chronic pain: Secondary | ICD-10-CM | POA: Diagnosis not present

## 2014-06-26 DIAGNOSIS — Z720 Tobacco use: Secondary | ICD-10-CM | POA: Diagnosis not present

## 2014-06-26 DIAGNOSIS — Z8719 Personal history of other diseases of the digestive system: Secondary | ICD-10-CM | POA: Diagnosis not present

## 2014-06-26 DIAGNOSIS — Z79899 Other long term (current) drug therapy: Secondary | ICD-10-CM | POA: Insufficient documentation

## 2014-06-26 DIAGNOSIS — R202 Paresthesia of skin: Secondary | ICD-10-CM | POA: Diagnosis not present

## 2014-06-26 DIAGNOSIS — Z862 Personal history of diseases of the blood and blood-forming organs and certain disorders involving the immune mechanism: Secondary | ICD-10-CM | POA: Insufficient documentation

## 2014-06-26 DIAGNOSIS — R42 Dizziness and giddiness: Secondary | ICD-10-CM | POA: Insufficient documentation

## 2014-06-26 DIAGNOSIS — Z4801 Encounter for change or removal of surgical wound dressing: Secondary | ICD-10-CM | POA: Insufficient documentation

## 2014-06-26 DIAGNOSIS — Z8619 Personal history of other infectious and parasitic diseases: Secondary | ICD-10-CM | POA: Diagnosis not present

## 2014-06-26 DIAGNOSIS — M79641 Pain in right hand: Secondary | ICD-10-CM | POA: Insufficient documentation

## 2014-06-27 ENCOUNTER — Encounter (HOSPITAL_COMMUNITY): Payer: Self-pay | Admitting: Emergency Medicine

## 2014-06-27 ENCOUNTER — Emergency Department (HOSPITAL_COMMUNITY)
Admission: EM | Admit: 2014-06-27 | Discharge: 2014-06-27 | Disposition: A | Discharge status: Home / Self Care | Payer: Medicaid Other | Attending: Emergency Medicine | Admitting: Emergency Medicine

## 2014-06-27 DIAGNOSIS — Z4801 Encounter for change or removal of surgical wound dressing: Secondary | ICD-10-CM

## 2014-06-27 DIAGNOSIS — M79601 Pain in right arm: Secondary | ICD-10-CM

## 2014-06-27 LAB — POC OCCULT BLOOD, ED: FECAL OCCULT BLD: NEGATIVE

## 2014-06-27 LAB — BASIC METABOLIC PANEL
Anion gap: 10 (ref 5–15)
BUN: 12 mg/dL (ref 6–20)
CALCIUM: 9 mg/dL (ref 8.9–10.3)
CHLORIDE: 107 mmol/L (ref 101–111)
CO2: 23 mmol/L (ref 22–32)
Creatinine, Ser: 0.83 mg/dL (ref 0.44–1.00)
GFR calc Af Amer: >60 mL/min (ref >60)
GFR calc non Af Amer: >60 mL/min (ref >60)
GLUCOSE: 98 mg/dL (ref 65–99)
Potassium: 3.6 mmol/L (ref 3.5–5.1)
SODIUM: 140 mmol/L (ref 135–145)

## 2014-06-27 LAB — CBC
HEMATOCRIT: 29.4 % — AB (ref 36.0–46.0)
Hemoglobin: 9.4 g/dL — ABNORMAL LOW (ref 12.0–15.0)
MCH: 26.6 pg (ref 26.0–34.0)
MCHC: 32 g/dL (ref 30.0–36.0)
MCV: 83.3 fL (ref 78.0–100.0)
PLATELETS: 323 10*3/uL (ref 150–400)
RBC: 3.53 MIL/uL — ABNORMAL LOW (ref 3.87–5.11)
RDW: 14.9 % (ref 11.5–15.5)
WBC: 10.5 10*3/uL (ref 4.0–10.5)

## 2014-06-27 MED ORDER — SUCRALFATE 1 GM/10ML PO SUSP: 1.0000 g | Freq: Three times a day (TID) | ORAL | Status: DC

## 2014-06-27 MED ORDER — PANTOPRAZOLE SODIUM 20 MG PO TBEC: 20.0000 mg | DELAYED_RELEASE_TABLET | Freq: Every day | ORAL | Status: DC

## 2014-06-27 MED ORDER — HYDROMORPHONE HCL 1 MG/ML IJ SOLN
0.5000 mg | Freq: Once | INTRAMUSCULAR | Status: AC
  Administered 2014-06-27: 0.5 mg via INTRAVENOUS
  Filled 2014-06-27: qty 1

## 2014-06-27 MED ORDER — NAPROXEN 500 MG PO TABS: 500.0000 mg | ORAL_TABLET | Freq: Two times a day (BID) | ORAL | Status: DC

## 2014-06-27 MED ORDER — OXYCODONE-ACETAMINOPHEN 5-325 MG PO TABS
2.0000 | ORAL_TABLET | Freq: Once | ORAL | Status: AC
  Administered 2014-06-27: 2 via ORAL
  Filled 2014-06-27: qty 2

## 2014-06-27 NOTE — ED Notes (Addendum)
Pt. reports pain at right hand unrelieved by prescription pain medication prescribed here 5 days ago , denies recent fall or injury. Dressing intact at right forearm s/p arm surgery last month.

## 2014-06-27 NOTE — Discharge Instructions (Signed)
Follow up with Dr. Melvyn Novasrtmann as soon as you are able. You are almost due to have your sutures removed. Take Naproxen with Protonix and Carafate. Take 2 tabs of percocet every 6 hours for severe pain. Continue with icing and elevation as much as you are able.  SURGICAL PROCEDURE COMPLETED: 1. Right wrist pollicis longus tendon repair. 2. Right wrist extensor pollicis brevis musculotendinous junction  repair. 3. Right wrist superficial radial nerve neurolysis and decompression,  exploration. 4. Right wrist flexor carpi radialis tendon repair. 5. Right wrist brachioradialis tendon repair. 6. Right wrist right radial artery repair. 7. Right wrist median nerve exploration and neuroplasty of the  forearm. 8. Right wrist traumatic laceration repair, greater than 7.5 cm.  Staple Care and Removal Your caregiver has used staples today to repair your wound. Staples are used to help a wound heal faster by holding the edges of the wound together. The staples can be removed when the wound has healed well enough to stay together after the staples are removed. A dressing (wound covering), depending on the location of the wound, may have been applied. This may be changed once per day or as instructed. If the dressing sticks, it may be soaked off with soapy water or hydrogen peroxide. Only take over-the-counter or prescription medicines for pain, discomfort, or fever as directed by your caregiver.  If you did not receive a tetanus shot today because you did not recall when your last one was given, check with your caregiver when you have your staples removed to determine if one is needed. Return to your caregiver's office in 1 week or as suggested to have your staples removed. SEEK IMMEDIATE MEDICAL CARE IF:   You have redness, swelling, or increasing pain in the wound.  You have pus coming from the wound.  You have a fever.  You notice a bad smell coming from the wound or dressing.  Your wound  edges break open after staples have been removed. Document Released: 10/01/2000 Document Revised: 03/31/2011 Document Reviewed: 10/16/2004 [facility] Patient Information 2015 [place]ExitCare, [place]. This information is not intended to replace advice given to you by your health care provider. Make sure you discuss any questions you have with your health care provider.
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
## 2014-07-20 ENCOUNTER — Ambulatory Visit: Payer: Medicaid Other | Attending: Orthopedic Surgery | Admitting: Occupational Therapy

## 2014-07-20 ENCOUNTER — Encounter: Payer: Self-pay | Admitting: Occupational Therapy

## 2014-07-20 DIAGNOSIS — M25641 Stiffness of right hand, not elsewhere classified: Secondary | ICD-10-CM | POA: Insufficient documentation

## 2014-07-20 DIAGNOSIS — Z9889 Other specified postprocedural states: Secondary | ICD-10-CM

## 2014-07-20 DIAGNOSIS — M25531 Pain in right wrist: Secondary | ICD-10-CM

## 2014-07-20 DIAGNOSIS — R609 Edema, unspecified: Secondary | ICD-10-CM | POA: Insufficient documentation
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
## 2014-07-26 ENCOUNTER — Ambulatory Visit: Payer: Medicaid Other | Attending: Orthopedic Surgery | Admitting: Occupational Therapy

## 2014-07-26 ENCOUNTER — Telehealth: Payer: Self-pay | Admitting: Occupational Therapy

## 2014-07-26 DIAGNOSIS — Z9889 Other specified postprocedural states: Secondary | ICD-10-CM | POA: Insufficient documentation

## 2014-07-26 DIAGNOSIS — R609 Edema, unspecified: Secondary | ICD-10-CM | POA: Insufficient documentation

## 2014-07-26 DIAGNOSIS — M25641 Stiffness of right hand, not elsewhere classified: Secondary | ICD-10-CM | POA: Insufficient documentation

## 2014-07-26 DIAGNOSIS — M25531 Pain in right wrist: Secondary | ICD-10-CM | POA: Insufficient documentation

## 2014-07-26 NOTE — Telephone Encounter (Signed)
Therapist attempted to contact patient regarding missed appointment. Therapist was unable to leave a message pt's phone number had been disconnected or changed.

## 2014-08-01 ENCOUNTER — Encounter: Payer: Self-pay | Admitting: Occupational Therapy

## 2014-08-08 ENCOUNTER — Ambulatory Visit: Payer: Medicaid Other | Admitting: Occupational Therapy

## 2014-08-08 DIAGNOSIS — Z9889 Other specified postprocedural states: Secondary | ICD-10-CM

## 2014-08-08 DIAGNOSIS — M25641 Stiffness of right hand, not elsewhere classified: Secondary | ICD-10-CM

## 2014-08-08 DIAGNOSIS — R609 Edema, unspecified: Secondary | ICD-10-CM

## 2014-08-08 DIAGNOSIS — M25531 Pain in right wrist: Secondary | ICD-10-CM

## 2014-08-08 NOTE — Therapy (Signed)
[facility] [address] [place], [place], [address] Phone: [phone]   Fax:  [phone]  Occupational Therapy Treatment  Patient Details  Name: Jacqueline Clarke MRN: [id number] Date of Birth: 22-Jun-1985 Referring Provider:  Bradly Bienenstock, MD  Encounter Date: 08/08/2014      OT End of Session - 08/08/14 2001    Visit Number 2   Number of Visits 4   Authorization Type Medicaid visists approved from 08/01/14-08/21/14   OT Start Time 1317   OT Stop Time 1400   OT Time Calculation (min) 43 min   Activity Tolerance Patient limited by pain   Behavior During Therapy [facility] for tasks assessed/performed      Past Medical History  Diagnosis Date  . Asthma   . Scoliosis   . Blood type, Rh negative   . Pregnant state, incidental   . Anemia   . Chronic back pain     scolosis- on percocet  . Hyperemesis complicating pregnancy, antepartum     Zofran PRN  . Headache(784.0)   . Cholelithiasis   . Pregnancy induced hypertension   . Hypothyroidism   . 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 Obesity     Past Surgical History  Procedure Laterality Date  . Cholecystectomy    . Adenoidectomy    . Dilation and curettage of uterus  2008  . Arm surgery      There were no vitals filed for this visit.  Visit Diagnosis:  Pain in joint, forearm, right  S/P tendon repair  Edema  Joint stiffness of hand, right      Subjective Assessment - 08/08/14 1336    Subjective  Pain limits response,   Patient Stated Goals reduce pain in right arm   Currently in Pain? Yes   Pain Score 8    Pain Orientation Right   Pain Descriptors / Indicators Stabbing;Throbbing   Pain Type Acute pain   Pain Onset 1 to 4 weeks ago   Pain Frequency Constant   Aggravating Factors  movement   Pain Relieving Factors medication   Effect of Pain on Daily Activities unable to use hand for ADLS/ IADLS   Multiple Pain Sites No                       OT Treatments/Exercises (OP) - 08/08/14 0001    ADLs   Overall ADLs Therapist washed pt's RUE with soap/ water and dried thoroughly. Pt with stitches still in place over incision. Pt's arm was dressed with tensogrip.   ADL Comments Pt was instructed to wear brace when not exercising, or performing hand hygeine   Exercises   Exercises Wrist;Hand   Wrist Exercises   Forearm Supination AROM;Right;10 reps   Forearm Pronation AROM;10 reps;Right   Wrist Flexion AROM;10 reps;Right   Wrist Extension AROM;10 reps;Right   Hand Exercises   MCPJ Flexion AROM;Right;10 reps  composite finger   MCPJ Extension AROM;Right;10 reps   PIPJ Flexion AROM;10 reps;Right   DIPJ Flexion AROM;10 reps;Right  index finger   Joint Blocking Exercises IP joint for thumb as well as thumb flexion extension   Thumb Opposition to index finger as able   Desensitization encouraged pt to gently was RUE with a washcloth avoiding incision to desensitize   Modalities   Modalities Moist Heat   Moist Heat Therapy   Number Minutes Moist Heat 10 Minutes   Moist Heat Location Hand;Wrist  no adverse reactions    /            OT Education - 08/08/14 2000    Education provided Yes   Education Details initial HEP   Person(s) Educated Patient   Methods Explanation;Demonstration;Verbal cues;Handout   Comprehension Verbalized understanding;Returned demonstration             OT Long Term Goals - 07/21/14 0910    OT LONG TERM GOAL #1   Title Patient will be independent in donning / doffing splint, and understand wearing schedule   Baseline dependent   Time 4   Period Weeks   Status New   OT LONG TERM GOAL #2   Title Patient wil lbe able to complete home exercise program independently with pain no greater than 4/10 following activity   Baseline dependent   Time 4   Period Weeks   Status New   OT LONG TERM GOAL #3   Title Patient will demonstrate adequate AROM in right  wrist and hand to use as assist for ADL/IADL without prompting   Baseline dependent   Time 4   Period Weeks   Status New               Plan - 08/08/14 1341    Clinical Impression Statement Pt arrives today wearing pre-fab thumb spica splint. She demonstrates significant pain and stiffness as she no-howed for previously scheduled appointment.   Pt will benefit from skilled therapeutic intervention in order to improve on the following deficits (Retired) Decreased coordination;Decreased scar mobility;Decreased range of motion;Increased edema;Decreased knowledge of precautions;Decreased strength;Impaired sensation;Pain;Impaired UE functional use   OT Frequency 1x / week   OT Duration 4 weeks   OT Treatment/Interventions Self-care/ADL training;Splinting;Therapeutic exercises;Therapeutic activities;Patient/family education;Passive range of motion;DME and/or AE instruction;Scar mobilization;Compression bandaging;Moist Heat;Electrical Stimulation;Therapeutic exercise;Manual Therapy;Contrast Bath;Cryotherapy;Fluidtherapy   Plan progress per protocols as tolerated   OT Home Exercise Plan issued 08/08/14   Consulted and Agree with Plan of Care Patient        Problem List Patient Active Problem List   Diagnosis Date Noted  . Depressive disorder 12/14/2012  . Normal delivery 07/24/2011  . Hyperemesis complicating pregnancy, antepartum 12/20/2010    Quetzali Heinle 08/08/2014, 8:10 PM Keene BreathKathryn Young Brim, OTR/L [phone] Phone: [phone] 8:10 PM 08/08/2014 [facility] [address] [place], [place], [address] Phone: [phone]   Fax:  [phone]

## 2014-08-08 NOTE — Patient Instructions (Signed)
Remove splint and perform the following 4-6x day each, 10-20 reps each     AROM: Wrist Extension   .  With __right__ palm down, bend wrist up. Repeat __15__ times per set.  Do __4-6__ sessions per day.       AROM: Forearm Pronation / Supination   With _right___ arm in handshake position, slowly rotate palm down until stretch is felt. Relax. Then rotate palm up until stretch is felt. Repeat _15___ times per set. Do _4-6___ sessions per day.  Copyright  VHI. All rights reserved.     MP Flexion (Active Isolated)   Bend ___each___ finger at large knuckle, keeping other fingers straight. Do not bend tips. Repeat _10-15___ times. Do __4-6__ sessions per day.  AROM: PIP Flexion / Extension   Pinch bottom knuckle of ___index_____ finger of hand to prevent bending. Actively bend middle knuckle until stretch is felt. Hold __5__ seconds. Relax. Straighten finger as far as possible. Repeat __10-15__ times per set. Do _4-6___ sessions per day.   AROM: DIP Flexion / Extension   Pinch middle knuckle of _indexOpposition (Active)   Touch tip of thumb to nail tip of each finger in turn, making an "O" shape. Repeat __10__ times. Do _4-6___ sessions per day.   MP Flexion (Active)   Bend thumb to touch base of little finger, keeping tip joint straight. Repeat __10-15__ times. Do _4-6___ sessions per day.       IP Flexion (Active Blocked)   Brace thumb below tip joint. Bend joint as far as possible. Repeat __10__ times. Do _4-6___ sessions per day.   Composite Extension (Active)   Bring thumb up and out in hitchhiker position.  Repeat __10-15__ times. Do _4-6___ sessions per day.  Opposition (Active)   Touch tip of thumb to nail tip of each finger in turn, making an "O" shape. Repeat __10__ times. Do _4-6___ sessions per day.   MP Flexion (Active)   Bend thumb to touch base of little finger, keeping tip joint straight. Repeat __10-15__ times. Do _4-6___  sessions per day.       IP Flexion (Active Blocked)   Brace thumb below tip joint. Bend joint as far as possible. Repeat __10__ times. Do _4-6___ sessions per day.   Composite Extension (Active)   Bring thumb up and out in hitchhiker position.  Repeat __10-15__ times. Do _4-6___ sessions per day.  Opposition (Active)   Touch tip of thumb to nail tip of each finger in turn, making an "O" shape. Repeat __10__ times. Do _4-6___ sessions per day.   MP Flexion (Active)   Bend thumb to touch base of little finger, keeping tip joint straight. Repeat __10-15__ times. Do _4-6___ sessions per day.       IP Flexion (Active Blocked)   Brace thumb below tip joint. Bend joint as far as possible. Repeat __10__ times. Do _4-6___ sessions per day.   Composite Extension (Active)   Bring thumb up and out in hitchhiker position.  Repeat __10-15__ times. Do _4-6___ sessions per day.  Opposition (Active)   Touch tip of thumb to nail tip of each finger in turn, making an "O" shape. Repeat __10__ times. Do _4-6___ sessions per day.   MP Flexion (Active)   Bend thumb to touch base of little finger, keeping tip joint straight. Repeat __10-15__ times. Do _4-6___ sessions per day.       IP Flexion (Active Blocked)   Brace thumb below tip joint. Bend joint as far as possible. Repeat __10__  times. Do _4-6___ sessions per day.   Composite Extension (Active)   Bring thumb up and out in hitchhiker position.  Repeat __10-15__ times. Do _4-6___ sessions per day.  _______ finger of  hand to prevent bending. Bend end knuckle until stretch is felt. Hold _5___ seconds. Relax. Straighten finger as far as possible. Repeat _10-15___ times per set.  Do _4-6___ sessions per day.  AROM: Finger Flexion / Extension   Actively bend fingers of  hand. Start with knuckles furthest from palm, and slowly make a fist. Hold __5__ seconds. Relax. Then straighten fingers as far as  possible. Repeat _10-15___ times per set.  Do _4-6___ sessions per day.  Copyright  VHI. All rights reserved.

## 2014-08-11 ENCOUNTER — Ambulatory Visit: Payer: Medicaid Other | Admitting: Occupational Therapy

## 2014-08-14 ENCOUNTER — Ambulatory Visit: Payer: Medicaid Other | Admitting: Occupational Therapy

## 2014-08-18 ENCOUNTER — Other Ambulatory Visit (INDEPENDENT_AMBULATORY_CARE_PROVIDER_SITE_OTHER): Payer: Medicaid Other

## 2014-08-18 VITALS — BP 114/73 | HR 74 | Temp 98.5°F | Wt 254.0 lb

## 2014-08-18 DIAGNOSIS — N926 Irregular menstruation, unspecified: Secondary | ICD-10-CM

## 2014-08-18 LAB — POCT URINE PREGNANCY: Preg Test, Ur: NEGATIVE

## 2014-08-18 NOTE — Progress Notes (Signed)
Patient in office today for a pregnancy test. Patient states she has had regular periods on her Nexplanon. Patient states she has not had a period this month. Pregnancy Test in office is negative. Patient would like to schedule to have her Nexplanon removed.   BP 114/73 mmHg  Pulse 74  Temp(Src) 98.5 F (36.9 C)  Wt 254 lb (115.214 kg)  LMP 07/13/2014

## 2014-08-21 ENCOUNTER — Encounter: Payer: Self-pay | Admitting: Obstetrics

## 2014-08-21 ENCOUNTER — Ambulatory Visit (INDEPENDENT_AMBULATORY_CARE_PROVIDER_SITE_OTHER): Payer: Medicaid Other | Admitting: Obstetrics

## 2014-08-21 VITALS — BP 99/70 | HR 73 | Temp 99.8°F | Ht 70.0 in | Wt 250.2 lb

## 2014-08-21 DIAGNOSIS — Z3169 Encounter for other general counseling and advice on procreation: Secondary | ICD-10-CM

## 2014-08-21 DIAGNOSIS — Z308 Encounter for other contraceptive management: Secondary | ICD-10-CM

## 2014-08-21 DIAGNOSIS — Z3046 Encounter for surveillance of implantable subdermal contraceptive: Secondary | ICD-10-CM

## 2014-08-21 MED ORDER — OB COMPLETE PETITE 35-5-1-200 MG PO CAPS: 1.0000 | ORAL_CAPSULE | Freq: Every day | ORAL | Status: DC

## 2014-08-21 NOTE — Progress Notes (Signed)
NEXPLANON REMOVAL NOTE  Date of LMP:   unknown  Contraception used: *Nexplanon   Indications:  The patient desires removal of Nexplanon.  She understands risks, benefits, and alternatives to Nexplanon and would like to proceed.  Anesthesia:   Lidocaine 1% plain.  Procedure:  A time-out was performed confirming the procedure and the patient's allergy status.  Complications: None                      The rod was palpated and the area was sterilely prepped.  The area beneath the distal tip was anesthetized with 1% xylocaine and the skin incised                       Over the tip and the tip was exposed, grasped with forcep and removed intact.  A single suture of 4-0 Vicryl was used to close incision.  Steri strip and a bandage applied and the arm was wrapped with gauze bandage.  The patient tolerated well.   Instructions:  The patient was instructed to remove the dressing in 24 hours and that some bruising is to be expected.  She was advised to use over the counter analgesics as needed for any pain at the site.  She is to keep the area dry for 24 hours and to call if her hand or arm becomes cold, numb, or blue.  Return visit:  Return in 2 weeks

## 2014-09-04 ENCOUNTER — Ambulatory Visit (INDEPENDENT_AMBULATORY_CARE_PROVIDER_SITE_OTHER): Payer: Medicaid Other | Admitting: Obstetrics

## 2014-09-04 ENCOUNTER — Encounter: Payer: Self-pay | Admitting: Obstetrics

## 2014-09-04 VITALS — BP 115/79 | HR 106 | Temp 99.9°F | Ht 70.0 in | Wt 252.0 lb

## 2014-09-04 DIAGNOSIS — Z975 Presence of (intrauterine) contraceptive device: Secondary | ICD-10-CM

## 2014-09-04 NOTE — Progress Notes (Signed)
Subjective:    Jacqueline Clarke is a 29 y.o. female who presents for 2 week f/u after Nexplanon removal. The patient has no complaints today. The patient is sexually active. Significant past medical history: none.  The information documented in the HPI was reviewed and verified.    Current outpatient prescriptions:  .  pantoprazole (PROTONIX) 20 MG tablet, Take 1 tablet (20 mg total) by mouth daily., Disp: 30 tablet, Rfl: 0 .  prazosin (MINIPRESS) 2 MG capsule, Take 2 mg by mouth at bedtime., Disp: , Rfl: 1 .  Prenat-FeCbn-FeAspGl-FA-Omega (OB COMPLETE PETITE) 35-5-1-200 MG CAPS, Take 1 capsule by mouth daily before breakfast., Disp: 90 capsule, Rfl: 3 .  QUEtiapine (SEROQUEL) 100 MG tablet, Take 100 mg by mouth at bedtime., Disp: , Rfl:  .  venlafaxine (EFFEXOR) 75 MG tablet, Take 75 mg by mouth 2 (two) times daily., Disp: , Rfl:  .  etonogestrel (NEXPLANON) 68 MG IMPL implant, 1 each by Subdermal route once., Disp: , Rfl:  .  HYDROcodone-acetaminophen (NORCO/VICODIN) 5-325 MG per tablet, Take 2 tablets by mouth every 6 (six) hours as needed for moderate pain. , Disp: , Rfl: 0 .  HYDROmorphone (DILAUDID) 4 MG tablet, Take 1 tablet (4 mg total) by mouth every 4 (four) hours as needed for severe pain. (Patient not taking: Reported on 09/04/2014), Disp: 10 tablet, Rfl: 0 .  naproxen (NAPROSYN) 500 MG tablet, Take 1 tablet (500 mg total) by mouth 2 (two) times daily. (Patient not taking: Reported on 08/21/2014), Disp: 30 tablet, Rfl: 0 .  ondansetron (ZOFRAN) 4 MG tablet, Take 1 tablet (4 mg total) by mouth every 6 (six) hours. (Patient not taking: Reported on 08/21/2014), Disp: 12 tablet, Rfl: 0 .  oxyCODONE-acetaminophen (PERCOCET) 10-325 MG per tablet, Take 1 tablet by mouth every 4 (four) hours as needed for pain., Disp: , Rfl:  .  sucralfate (CARAFATE) 1 GM/10ML suspension, Take 10 mLs (1 g total) by mouth 4 (four) times daily -  with meals and at bedtime. (Patient not taking: Reported on  09/04/2014), Disp: 420 mL, Rfl: 0 No Known Allergies  Social History  Substance Use Topics  . Smoking status: Current Every Day Smoker -- 0.50 packs/day    Types: Cigarettes    Last Attempt to Quit: 11/17/2010  . Smokeless tobacco: Never Used  . Alcohol Use: 0.0 oz/week    0 Standard drinks or equivalent per week     Comment: Occassionally    Family History  Problem Relation Age of Onset  . Heart disease Maternal Grandfather   . Diabetes Maternal Grandfather   . Anesthesia problems Neg Hx        Review of Systems Constitutional: negative for weight loss Genitourinary:negative for abnormal menstrual periods and vaginal discharge   Objective:   BP 115/79 mmHg  Pulse 106  Temp(Src) 99.9 F (37.7 C)  Ht  (1.778 m)  Wt 252 lb (114.306 kg)  BMI 36.16 kg/m2  LMP 08/12/2014   PE:      Left upper arm:  Nexplanon removal site C, D, I and non tender.  Suture removed.   Lab Review Urine pregnancy test Labs reviewed no Radiologic studies reviewed no    Assessment:    29 y.o., discontinuing Nexplanon, no contraindications.   Preconception counseling.  Plan:   Continue PNV's  All questions answered. Contraception: none.  Plans to have another child.. Discussed healthy lifestyle modifications.  No orders of the defined types were placed in this encounter.   No  orders of the defined types were placed in this encounter.

## 2014-10-04 ENCOUNTER — Encounter: Payer: Self-pay | Admitting: Obstetrics

## 2014-10-18 ENCOUNTER — Inpatient Hospital Stay (HOSPITAL_COMMUNITY): Payer: Medicaid Other

## 2014-10-18 ENCOUNTER — Encounter (HOSPITAL_COMMUNITY): Payer: Self-pay | Admitting: Student

## 2014-10-18 ENCOUNTER — Inpatient Hospital Stay (HOSPITAL_COMMUNITY)
Admission: AD | Admit: 2014-10-18 | Discharge: 2014-10-18 | Disposition: A | Discharge status: Home / Self Care | Payer: Medicaid Other | Source: Ambulatory Visit | Attending: Obstetrics | Admitting: Obstetrics

## 2014-10-18 DIAGNOSIS — O99331 Smoking (tobacco) complicating pregnancy, first trimester: Secondary | ICD-10-CM | POA: Insufficient documentation

## 2014-10-18 DIAGNOSIS — Z3A01 Less than 8 weeks gestation of pregnancy: Secondary | ICD-10-CM | POA: Diagnosis not present

## 2014-10-18 DIAGNOSIS — O26899 Other specified pregnancy related conditions, unspecified trimester: Secondary | ICD-10-CM

## 2014-10-18 DIAGNOSIS — R109 Unspecified abdominal pain: Secondary | ICD-10-CM | POA: Diagnosis not present

## 2014-10-18 DIAGNOSIS — F17210 Nicotine dependence, cigarettes, uncomplicated: Secondary | ICD-10-CM | POA: Insufficient documentation

## 2014-10-18 DIAGNOSIS — O9989 Other specified diseases and conditions complicating pregnancy, childbirth and the puerperium: Secondary | ICD-10-CM

## 2014-10-18 DIAGNOSIS — O26891 Other specified pregnancy related conditions, first trimester: Secondary | ICD-10-CM | POA: Diagnosis not present

## 2014-10-18 DIAGNOSIS — J45909 Unspecified asthma, uncomplicated: Secondary | ICD-10-CM | POA: Diagnosis not present

## 2014-10-18 DIAGNOSIS — O3680X0 Pregnancy with inconclusive fetal viability, not applicable or unspecified: Secondary | ICD-10-CM

## 2014-10-18 DIAGNOSIS — M549 Dorsalgia, unspecified: Secondary | ICD-10-CM | POA: Insufficient documentation

## 2014-10-18 DIAGNOSIS — E039 Hypothyroidism, unspecified: Secondary | ICD-10-CM | POA: Insufficient documentation

## 2014-10-18 LAB — ABO/RH: ABO/RH(D): O NEG

## 2014-10-18 LAB — URINALYSIS, ROUTINE W REFLEX MICROSCOPIC
Bilirubin Urine: NEGATIVE
GLUCOSE, UA: NEGATIVE mg/dL
Hgb urine dipstick: NEGATIVE
KETONES UR: 15 mg/dL — AB
NITRITE: NEGATIVE
PH: 6 (ref 5.0–8.0)
Protein, ur: NEGATIVE mg/dL
Specific Gravity, Urine: 1.025 (ref 1.005–1.030)
Urobilinogen, UA: 1 mg/dL (ref 0.0–1.0)

## 2014-10-18 LAB — CBC
HEMATOCRIT: 34.1 % — AB (ref 36.0–46.0)
Hemoglobin: 10.9 g/dL — ABNORMAL LOW (ref 12.0–15.0)
MCH: 25.3 pg — ABNORMAL LOW (ref 26.0–34.0)
MCHC: 32 g/dL (ref 30.0–36.0)
MCV: 79.3 fL (ref 78.0–100.0)
Platelets: 240 10*3/uL (ref 150–400)
RBC: 4.3 MIL/uL (ref 3.87–5.11)
RDW: 17.8 % — ABNORMAL HIGH (ref 11.5–15.5)
WBC: 6.1 10*3/uL (ref 4.0–10.5)

## 2014-10-18 LAB — WET PREP, GENITAL
Trich, Wet Prep: NONE SEEN
Yeast Wet Prep HPF POC: NONE SEEN

## 2014-10-18 LAB — URINE MICROSCOPIC-ADD ON

## 2014-10-18 LAB — HCG, QUANTITATIVE, PREGNANCY: hCG, Beta Chain, Quant, S: 2642 m[IU]/mL — ABNORMAL HIGH (ref <5)

## 2014-10-18 NOTE — Discharge Instructions (Signed)

## 2014-10-18 NOTE — MAU Provider Note (Signed)
History     CSN: [id number]  Arrival date and time: 10/18/14 4098   First Provider Initiated Contact with Patient 10/18/14 1046         Chief Complaint  Patient presents with  . Possible Pregnancy  . Abdominal Cramping  . Nausea   HPI  Jacqueline ROWEN is a 29 y.o. female who presents for possible pregnancy & abdominal pain.  Nexplanon removed beginning of August; currently no birth control.  LMP 09/09/14.  Abdominal cramping x 1 week. Rates 9/10. Has not treated.  Denies vaginal discharge or vaginal bleeding.  Nausea but no vomiting.    OB History    Gravida Para Term Preterm AB TAB SAB Ectopic Multiple Living   0 2 0 2 0  3      Past Medical History  Diagnosis Date  . Asthma   . Scoliosis   . Blood type, Rh negative   . Anemia   . Chronic back pain     scolosis- on percocet  . Hyperemesis complicating pregnancy, antepartum 2013  . Headache(784.0)   . Cholelithiasis   . Pregnancy induced hypertension   . Hypothyroidism   . 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 Obesity     Past Surgical History  Procedure Laterality Date  . Adenoidectomy    . Nerve, tendon and artery repair Right 06/18/2014    Procedure: NERVE, TENDON AND ARTERY REPAIR;  Surgeon: Bradly Bienenstock, MD;  Location: [facility] OR;  Service: Orthopedics;  Laterality: Right;  . Cholecystectomy    . Adenoidectomy    . Dilation and curettage of uterus  2008  . Arm surgery      Family History  Problem Relation Age of Onset  . Heart disease Maternal Grandfather   . Diabetes Maternal Grandfather   . Anesthesia problems Neg Hx     Social History  Substance Use Topics  . Smoking status: Current Every Day Smoker -- 0.50 packs/day    Types: Cigarettes    Last Attempt to Quit: 11/17/2010  . Smokeless tobacco: Never Used  . Alcohol Use: 0.0 oz/week    0 Standard drinks or equivalent per week     Comment: Occassionally    Allergies: No Known Allergies  Prescriptions prior to  admission  Medication Sig Dispense Refill Last Dose  . ibuprofen (ADVIL,MOTRIN) 200 MG tablet Take 1,000 mg by mouth every 6 (six) hours as needed for moderate pain.   10/17/2014 at Unknown time  . oxyCODONE-acetaminophen (PERCOCET) 10-325 MG per tablet Take 1 tablet by mouth every 4 (four) hours as needed for pain.   Past Week at Unknown time  . prazosin (MINIPRESS) 2 MG capsule Take 2 mg by mouth at bedtime.  1 Past Month at Unknown time  . Prenat-FeCbn-FeAspGl-FA-Omega (OB COMPLETE PETITE) 35-5-1-200 MG CAPS Take 1 capsule by mouth daily before breakfast. 90 capsule 3 Past Week at Unknown time  . QUEtiapine (SEROQUEL) 100 MG tablet Take 100 mg by mouth at bedtime.   10/16/2014  . venlafaxine (EFFEXOR) 75 MG tablet Take 75 mg by mouth 2 (two) times daily.   Past Week at Unknown time    Review of Systems  Constitutional: Negative.   Gastrointestinal: Positive for nausea and abdominal pain. Negative for vomiting, diarrhea and constipation.  Genitourinary: Negative.        No vaginal bleeding   Physical Exam   Blood pressure 105/73, pulse 76, temperature 98.7 F (37.1 C), temperature source  Oral, resp. rate 18, height  (1.727 m), weight 260 lb (117.935 kg), last menstrual period 09/09/2014.  Physical Exam  Nursing note and vitals reviewed. Constitutional: She is oriented to person, place, and time. She appears well-developed and well-nourished. No distress.  HENT:  Head: Normocephalic and atraumatic.  Eyes: Conjunctivae are normal. Right eye exhibits no discharge. Left eye exhibits no discharge. No scleral icterus.  Neck: Normal range of motion.  Cardiovascular: Normal rate, regular rhythm and normal heart sounds.   No murmur heard. Respiratory: Effort normal and breath sounds normal. No respiratory distress. She has no wheezes.  GI: Soft.  Genitourinary: Vagina normal. Cervix exhibits no motion tenderness, no discharge and no friability.  Cervix closed  Neurological: She is alert  and oriented to person, place, and time.  Skin: Skin is warm and dry. She is not diaphoretic.  Psychiatric: She has a normal mood and affect. Her behavior is normal. Judgment and thought content normal.    MAU Course  Procedures Results for orders placed or performed during the hospital encounter of 10/18/14 (from the past 24 hour(s))  Urinalysis, Routine w reflex microscopic (not at [facility])     Status: Abnormal   Collection Time: 10/18/14  9:57 AM  Result Value Ref Range   Color, Urine YELLOW YELLOW   APPearance CLEAR CLEAR   Specific Gravity, Urine 1.025 1.005 - 1.030   pH 6.0 5.0 - 8.0   Glucose, UA NEGATIVE NEGATIVE mg/dL   Hgb urine dipstick NEGATIVE NEGATIVE   Bilirubin Urine NEGATIVE NEGATIVE   Ketones, ur 15 (A) NEGATIVE mg/dL   Protein, ur NEGATIVE NEGATIVE mg/dL   Urobilinogen, UA 1.0 0.0 - 1.0 mg/dL   Nitrite NEGATIVE NEGATIVE   Leukocytes, UA SMALL (A) NEGATIVE  Urine microscopic-add on     Status: Abnormal   Collection Time: 10/18/14  9:57 AM  Result Value Ref Range   Squamous Epithelial / LPF FEW (A) RARE   WBC, UA 3-6 <3 WBC/hpf   RBC / HPF 0-2 <3 RBC/hpf   Bacteria, UA FEW (A) RARE   Urine-Other MUCOUS PRESENT   CBC     Status: Abnormal   Collection Time: 10/18/14 10:55 AM  Result Value Ref Range   WBC 6.1 4.0 - 10.5 K/uL   RBC 4.30 3.87 - 5.11 MIL/uL   Hemoglobin 10.9 (L) 12.0 - 15.0 g/dL   HCT 16.1 (L) 09.6 - 04.5 %   MCV 79.3 78.0 - 100.0 fL   MCH 25.3 (L) 26.0 - 34.0 pg   MCHC 32.0 30.0 - 36.0 g/dL   RDW 40.9 (H) 81.1 - 91.4 %   Platelets 240 150 - 400 K/uL  ABO/Rh     Status: None   Collection Time: 10/18/14 10:55 AM  Result Value Ref Range   ABO/RH(D) O NEG   hCG, quantitative, pregnancy     Status: Abnormal   Collection Time: 10/18/14 10:55 AM  Result Value Ref Range   hCG, Beta Chain, Quant, S 2642 (H) <5 mIU/mL  Wet prep, genital     Status: Abnormal   Collection Time: 10/18/14 11:00 AM  Result Value Ref Range   Yeast Wet Prep HPF POC NONE  SEEN NONE SEEN   Trich, Wet Prep NONE SEEN NONE SEEN   Clue Cells Wet Prep HPF POC FEW (A) NONE SEEN   WBC, Wet Prep HPF POC MANY (A) NONE SEEN   US Ob Comp Less 14 Wks  10/18/2014   CLINICAL DATA:  Pelvic pain for 2  days  EXAM: OBSTETRIC <14 WK [place] AND TRANSVAGINAL OB US  TECHNIQUE: Both transabdominal and transvaginal ultrasound examinations were performed for complete evaluation of the gestation as well as the maternal uterus, adnexal regions, and pelvic cul-de-sac. Transvaginal technique was performed to assess early pregnancy.  COMPARISON:  None.  FINDINGS: Intrauterine gestational sac: Single  Yolk sac:  No  Embryo:  No  Cardiac Activity: No  Heart Rate: Not applicable  bpm  MSD: 5.0  mm   5 w   2  d         [place] EDC: 06/18/2015.  Maternal uterus/adnexae:  Subchorionic hemorrhage: None  Right ovary: Normal  Left ovary: Normal  Other :None  Free fluid:  Trace  IMPRESSION: 1. Probable early intrauterine gestational sac, but no yolk sac, fetal pole, or cardiac activity yet visualized. Recommend follow-up quantitative B-HCG levels and follow-up US in 14 days to confirm and assess viability. This recommendation follows SRU consensus guidelines: Diagnostic Criteria for Nonviable Pregnancy Early in the First Trimester. Malva Limes Med 2013; 161:0960-45.   Electronically Signed   By: Signa Kell M.D.   On: 10/18/2014 11:56   US Ob Transvaginal  10/18/2014   CLINICAL DATA:  Pelvic pain for 2 days  EXAM: OBSTETRIC <14 WK [place] AND TRANSVAGINAL OB US  TECHNIQUE: Both transabdominal and transvaginal ultrasound examinations were performed for complete evaluation of the gestation as well as the maternal uterus, adnexal regions, and pelvic cul-de-sac. Transvaginal technique was performed to assess early pregnancy.  COMPARISON:  None.  FINDINGS: Intrauterine gestational sac: Single  Yolk sac:  No  Embryo:  No  Cardiac Activity: No  Heart Rate: Not applicable  bpm  MSD: 5.0  mm   5 w   2  d         [place] EDC: 06/18/2015.   Maternal uterus/adnexae:  Subchorionic hemorrhage: None  Right ovary: Normal  Left ovary: Normal  Other :None  Free fluid:  Trace  IMPRESSION: 1. Probable early intrauterine gestational sac, but no yolk sac, fetal pole, or cardiac activity yet visualized. Recommend follow-up quantitative B-HCG levels and follow-up US in 14 days to confirm and assess viability. This recommendation follows SRU consensus guidelines: Diagnostic Criteria for Nonviable Pregnancy Early in the First Trimester. Malva Limes Med 2013; 409:8119-14.   Electronically Signed   By: Signa Kell M.D.   On: 10/18/2014 11:56    MDM O negative - pt not bleeding. If failed pregnancy or bleeding, will need RHIG work up & rhogam Ultrasound - probable IUGS, no yolk sac Assessment and Plan  A: 1. Pregnancy of unknown anatomic location   2. Abdominal pain in pregnancy    P: Discharge home F/u in 48 hours for repeat BHCG Return sooner for worsening abdominal pain or vaginal bleeding  [name], NP  10/18/2014, 10:46 AM

## 2014-10-18 NOTE — MAU Note (Signed)
Missed her period, Aug period was short,  Has been real nauseated and having cramping.  Did not do home test.

## 2014-10-19 LAB — CULTURE, OB URINE

## 2014-10-19 LAB — GC/CHLAMYDIA PROBE AMP (~~LOC~~) NOT AT ARMC
Chlamydia: NEGATIVE
Neisseria Gonorrhea: NEGATIVE

## 2014-10-19 LAB — HIV ANTIBODY (ROUTINE TESTING W REFLEX): HIV Screen 4th Generation wRfx: NONREACTIVE

## 2014-10-20 ENCOUNTER — Other Ambulatory Visit: Payer: Self-pay | Admitting: Student

## 2014-10-20 ENCOUNTER — Inpatient Hospital Stay (HOSPITAL_COMMUNITY)
Admission: AD | Admit: 2014-10-20 | Discharge: 2014-10-20 | Disposition: A | Discharge status: Home / Self Care | Payer: Medicaid Other | Source: Ambulatory Visit | Attending: Obstetrics | Admitting: Obstetrics

## 2014-10-20 ENCOUNTER — Telehealth: Payer: Self-pay | Admitting: Student

## 2014-10-20 DIAGNOSIS — O26891 Other specified pregnancy related conditions, first trimester: Secondary | ICD-10-CM | POA: Insufficient documentation

## 2014-10-20 DIAGNOSIS — J029 Acute pharyngitis, unspecified: Secondary | ICD-10-CM

## 2014-10-20 DIAGNOSIS — Z3A01 Less than 8 weeks gestation of pregnancy: Secondary | ICD-10-CM | POA: Insufficient documentation

## 2014-10-20 DIAGNOSIS — F17210 Nicotine dependence, cigarettes, uncomplicated: Secondary | ICD-10-CM | POA: Insufficient documentation

## 2014-10-20 DIAGNOSIS — O99331 Smoking (tobacco) complicating pregnancy, first trimester: Secondary | ICD-10-CM | POA: Insufficient documentation

## 2014-10-20 LAB — INFLUENZA PANEL BY PCR (TYPE A & B)
H1N1FLUPCR: NOT DETECTED
INFLAPCR: NEGATIVE
INFLBPCR: NEGATIVE

## 2014-10-20 LAB — RAPID STREP SCREEN (MED CTR MEBANE ONLY): STREPTOCOCCUS, GROUP A SCREEN (DIRECT): POSITIVE — AB

## 2014-10-20 LAB — HCG, QUANTITATIVE, PREGNANCY: hCG, Beta Chain, Quant, S: 6372 m[IU]/mL — ABNORMAL HIGH (ref <5)

## 2014-10-20 MED ORDER — PENICILLIN V POTASSIUM 250 MG PO TABS
250.0000 mg | ORAL_TABLET | Freq: Three times a day (TID) | ORAL | Status: DC
Start: 2014-10-20 — End: 2014-10-28

## 2014-10-20 MED ORDER — ACETAMINOPHEN 325 MG PO TABS
650.0000 mg | ORAL_TABLET | Freq: Once | ORAL | Status: AC
  Administered 2014-10-20: 650 mg via ORAL
  Filled 2014-10-20: qty 2

## 2014-10-20 NOTE — Discharge Instructions (Signed)
Pharyngitis Pharyngitis is redness, pain, and swelling (inflammation) of your pharynx.  CAUSES  Pharyngitis is usually caused by infection. Most of the time, these infections are from viruses (viral) and are part of a cold. However, sometimes pharyngitis is caused by bacteria (bacterial). Pharyngitis can also be caused by allergies. Viral pharyngitis may be spread from person to person by coughing, sneezing, and personal items or utensils (cups, forks, spoons, toothbrushes). Bacterial pharyngitis may be spread from person to person by more intimate contact, such as kissing.  SIGNS AND SYMPTOMS  Symptoms of pharyngitis include:   Sore throat.   Tiredness (fatigue).   Low-grade fever.   Headache.  Joint pain and muscle aches.  Skin rashes.  Swollen lymph nodes.  Plaque-like film on throat or tonsils (often seen with bacterial pharyngitis). DIAGNOSIS  Your health care provider will ask you questions about your illness and your symptoms. Your medical history, along with a physical exam, is often all that is needed to diagnose pharyngitis. Sometimes, a rapid strep test is done. Other lab tests may also be done, depending on the suspected cause.  TREATMENT  Viral pharyngitis will usually get better in 3-4 days without the use of medicine. Bacterial pharyngitis is treated with medicines that kill germs (antibiotics).  HOME CARE INSTRUCTIONS   Drink enough water and fluids to keep your urine clear or pale yellow.   Only take over-the-counter or prescription medicines as directed by your health care provider:   If you are prescribed antibiotics, make sure you finish them even if you start to feel better.   Do not take aspirin.   Get lots of rest.   Gargle with 8 oz of salt water ( tsp of salt per 1 qt of water) as often as every 1-2 hours to soothe your throat.   Throat lozenges (if you are not at risk for choking) or sprays may be used to soothe your throat. SEEK MEDICAL  CARE IF:   You have large, tender lumps in your neck.  You have a rash.  You cough up green, yellow-brown, or bloody spit. SEEK IMMEDIATE MEDICAL CARE IF:   Your neck becomes stiff.  You drool or are unable to swallow liquids.  You vomit or are unable to keep medicines or liquids down.  You have severe pain that does not go away with the use of recommended medicines.  You have trouble breathing (not caused by a stuffy nose). MAKE SURE YOU:   Understand these instructions.  Will watch your condition.  Will get help right away if you are not doing well or get worse. Document Released: 01/06/2005 Document Revised: 10/27/2012 Document Reviewed: 09/13/2012 [facility] Patient Information 2015 [place], [place]. This information is not intended to replace advice given to you by your health care provider. Make sure you discuss any questions you have with your health care provider.    Safe Medications in Pregnancy   Acne: Benzoyl Peroxide Salicylic Acid  Backache/Headache: Tylenol: 2 regular strength every 4 hours OR              2 Extra strength every 6 hours  Colds/Coughs/Allergies: Benadryl (alcohol free) 25 mg every 6 hours as needed Breath right strips Claritin Cepacol throat lozenges Chloraseptic throat spray Cold-Eeze- up to three times per day Cough drops, alcohol free Flonase (by prescription only) Guaifenesin Mucinex Robitussin DM (plain only, alcohol free) Saline nasal spray/drops Sudafed (pseudoephedrine) & Actifed ** use only after [redacted] weeks gestation and if you do not have high blood  pressure Tylenol Vicks Vaporub Zinc lozenges Zyrtec   Constipation: Colace Ducolax suppositories Fleet enema Glycerin suppositories Metamucil Milk of magnesia Miralax Senokot Smooth move tea  Diarrhea: Kaopectate Imodium A-D  *NO pepto Bismol  Hemorrhoids: Anusol Anusol HC Preparation H Tucks  Indigestion: Tums Maalox Mylanta Zantac   Pepcid  Insomnia: Benadryl (alcohol free)  every 6 hours as needed Tylenol PM Unisom, no Gelcaps  Leg Cramps: Tums MagGel  Nausea/Vomiting:  Bonine Dramamine Emetrol Ginger extract Sea bands Meclizine  Nausea medication to take during pregnancy:  Unisom (doxylamine succinate 25 mg tablets) Take one tablet daily at bedtime. If symptoms are not adequately controlled, the dose can be increased to a maximum recommended dose of two tablets daily (1/2 tablet in the morning, 1/2 tablet mid-afternoon and one at bedtime). Vitamin B6  tablets. Take one tablet twice a day (up to 200 mg per day).  Skin Rashes: Aveeno products Benadryl cream or  every 6 hours as needed Calamine Lotion 1% cortisone cream  Yeast infection: Gyne-lotrimin 7 Monistat 7   **If taking multiple medications, please check labels to avoid duplicating the same active ingredients **take medication as directed on the label ** Do not exceed 4000 mg of tylenol in 24 hours **Do not take medications that contain aspirin or ibuprofen

## 2014-10-20 NOTE — MAU Provider Note (Signed)
History   [id number]   Chief Complaint  Patient presents with  . Follow-up    HPI Jacqueline Clarke is a 29 y.o. female [phone] here for follow-up BHCG.  Upon review of the records patient was first seen on 9/28 for abdominal pain.   BHCG on that day was 2642.  Ultrasound showed probable IUGS, no yolk sac.  GC/CT and wet prep were collected.  Results were negative.   Pt discharged home to return in 48hrs for BHCG. Pt here today with mild cramping, pain has improved. No vaginal bleeding.  Incidentally, started feeling sick this morning with fever, chills, body aches, & sore throat.  Denies any sick contacts. Denies cough, ear pain, or SOB. Took tylenol this morning.   Patient's last menstrual period was 09/09/2014.  OB History  Gravida Para Term Preterm AB SAB TAB Ectopic Multiple Living  0 2 2 0 0  3    # Outcome Date GA Lbr Len/2nd Weight Sex Delivery Anes PTL Lv  6 Current           5 Term 07/22/11 [redacted]w[redacted]d 03:24 / 00:15 8 lb 15.4 oz (4.065 kg) F Vag-Spont EPI  Y     Comments: WNL  4 Term 2011 [redacted]w[redacted]d 04:00 6 lb 14 oz (3.118 kg) M Vag-Spont EPI  Y  3 Term 2010 [redacted]w[redacted]d  7 lb 6 oz (3.345 kg) Jacqueline Clarke EPI  Y  2 SAB 2009          1 SAB 2006              Past Medical History  Diagnosis Date  . Asthma   . Scoliosis   . Blood type, Rh negative   . Anemia   . Chronic back pain     scolosis- on percocet  . Hyperemesis complicating pregnancy, antepartum 2013  . Headache(784.0)   . Cholelithiasis   . Pregnancy induced hypertension   . Hypothyroidism   . 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 Obesity     Family History  Problem Relation Age of Onset  . Heart disease Maternal Grandfather   . Diabetes Maternal Grandfather   . Anesthesia problems Neg Hx     Social History   Social History  . Marital Status: Single    Spouse Name: N/A  . Number of Children: N/A  . Years of Education: N/A   Social History Main Topics  . Smoking status: Current Every  Day Smoker -- 0.50 packs/day    Types: Cigarettes    Last Attempt to Quit: 11/17/2010  . Smokeless tobacco: Never Used  . Alcohol Use: 0.0 oz/week    0 Standard drinks or equivalent per week     Comment: Occassionally  . Drug Use: No  . Sexual Activity: Yes     Comment: nexplanon being removed 08-21-14   Other Topics Concern  . Not on file   Social History Narrative   ** Merged History Encounter **        No Known Allergies  No current facility-administered medications on file prior to encounter.   Current Outpatient Prescriptions on File Prior to Encounter  Medication Sig Dispense Refill  . oxyCODONE-acetaminophen (PERCOCET) 10-325 MG per tablet Take 1 tablet by mouth every 4 (four) hours as needed for pain.    . prazosin (MINIPRESS) 2 MG capsule Take 2 mg by mouth at bedtime.  1  . Prenat-FeCbn-FeAspGl-FA-Omega (OB COMPLETE PETITE) 35-5-1-200 MG CAPS  Take 1 capsule by mouth daily before breakfast. 90 capsule 3  . QUEtiapine (SEROQUEL) 100 MG tablet Take 100 mg by mouth at bedtime.    Marland Kitchen venlafaxine (EFFEXOR) 75 MG tablet Take 75 mg by mouth 2 (two) times daily.       Physical Exam    Patient Vitals for the past 24 hrs:  BP Temp Temp src Pulse Resp  10/20/14 1333 - 101.3 F (38.5 C) Oral - -  10/20/14 1227 - 101.8 F (38.8 C) Oral - -  10/20/14 1100 120/75 mmHg 100.8 F (38.2 C) Oral 104 18     Physical Exam  Nursing note and vitals reviewed. Constitutional: She is oriented to person, place, and time. She appears well-developed and well-nourished. No distress.  HENT:  Head: Normocephalic and atraumatic.  Mouth/Throat: Uvula is midline. Posterior oropharyngeal erythema present. No oropharyngeal exudate, posterior oropharyngeal edema or tonsillar abscesses.  Eyes: Conjunctivae are normal. Right eye exhibits no discharge. Left eye exhibits no discharge. No scleral icterus.  Neck: Normal range of motion. Neck supple.  Cardiovascular: Normal rate, regular rhythm and  normal heart sounds.   No murmur heard. Respiratory: Effort normal and breath sounds normal. No respiratory distress. She has no wheezes.  Lymphadenopathy:       Head (right side): Submandibular adenopathy present.  Neurological: She is alert and oriented to person, place, and time.  Skin: Skin is warm and dry. She is not diaphoretic.  Psychiatric: She has a normal mood and affect. Her behavior is normal. Judgment and thought content normal.    MAU Course  Procedures Component     Latest Ref Rng 10/18/2014 10/20/2014  HCG, Beta Chain, Quant, S     <5 mIU/mL 2642 (H) 6372 (H)    MDM Appropriate rise in BHCG; pt to schedule prenatal care with Dr. Clearance Coots.  Fever improved with tylenol given in MAU Strep & flu swabs collected  Assessment and Plan  29 y.o. [id number] at [redacted]w[redacted]d wks Pregnancy Follow-up BHCG Pregnancy of Unknown Location 1. Acute pharyngitis, unspecified pharyngitis type    P: Discharge home Go to urgent care or ED if symptoms worsen Discussed reasons to return to MAU Take tylenol PRN fever/pain per package directions Schedule prenatal care with Dr. Clearance Coots.  Flu & strep swabs pending  [name], NP 10/20/2014 12:19 PM

## 2014-10-20 NOTE — MAU Note (Addendum)
C/O pelvic aching/stretching. No bleeding. Feels achy and chilled like she has a fever, started last night.

## 2014-10-20 NOTE — Telephone Encounter (Signed)
Strep swab positive Left voicemail to call back  [name], NP

## 2014-10-21 ENCOUNTER — Other Ambulatory Visit: Payer: Self-pay | Admitting: Nurse Practitioner

## 2014-10-21 MED ORDER — AMOXICILLIN 500 MG PO CAPS: 500.0000 mg | ORAL_CAPSULE | Freq: Two times a day (BID) | ORAL | Status: DC

## 2014-10-21 NOTE — Progress Notes (Signed)
Client called back and talked with nurse.   Ordered medication to her pharmacy for positive strep throat.

## 2014-10-23 ENCOUNTER — Other Ambulatory Visit: Payer: Self-pay | Admitting: Obstetrics

## 2014-10-23 DIAGNOSIS — O3680X1 Pregnancy with inconclusive fetal viability, fetus 1: Secondary | ICD-10-CM

## 2014-10-24 ENCOUNTER — Other Ambulatory Visit: Payer: No Typology Code available for payment source

## 2014-10-26 ENCOUNTER — Telehealth: Payer: Self-pay

## 2014-10-26 ENCOUNTER — Ambulatory Visit (INDEPENDENT_AMBULATORY_CARE_PROVIDER_SITE_OTHER): Payer: Medicaid Other

## 2014-10-26 ENCOUNTER — Other Ambulatory Visit: Payer: Self-pay | Admitting: Certified Nurse Midwife

## 2014-10-26 DIAGNOSIS — Z3480 Encounter for supervision of other normal pregnancy, unspecified trimester: Secondary | ICD-10-CM

## 2014-10-26 DIAGNOSIS — O3680X1 Pregnancy with inconclusive fetal viability, fetus 1: Secondary | ICD-10-CM

## 2014-10-26 DIAGNOSIS — Z3490 Encounter for supervision of normal pregnancy, unspecified, unspecified trimester: Principal | ICD-10-CM

## 2014-10-26 NOTE — Telephone Encounter (Signed)
Need order put in for patient's viability u/s on 11/09/14 - thanks!

## 2014-10-28 ENCOUNTER — Encounter (HOSPITAL_COMMUNITY): Payer: Self-pay | Admitting: *Deleted

## 2014-10-28 ENCOUNTER — Inpatient Hospital Stay (HOSPITAL_COMMUNITY)
Admission: AD | Admit: 2014-10-28 | Discharge: 2014-10-28 | Disposition: A | Discharge status: Home / Self Care | Payer: Medicaid Other | Source: Ambulatory Visit | Attending: Obstetrics | Admitting: Obstetrics

## 2014-10-28 DIAGNOSIS — Z87891 Personal history of nicotine dependence: Secondary | ICD-10-CM | POA: Insufficient documentation

## 2014-10-28 DIAGNOSIS — J020 Streptococcal pharyngitis: Secondary | ICD-10-CM

## 2014-10-28 DIAGNOSIS — O210 Mild hyperemesis gravidarum: Secondary | ICD-10-CM | POA: Insufficient documentation

## 2014-10-28 DIAGNOSIS — Z3A01 Less than 8 weeks gestation of pregnancy: Secondary | ICD-10-CM | POA: Insufficient documentation

## 2014-10-28 DIAGNOSIS — O219 Vomiting of pregnancy, unspecified: Secondary | ICD-10-CM | POA: Diagnosis not present

## 2014-10-28 LAB — URINALYSIS, ROUTINE W REFLEX MICROSCOPIC
Bilirubin Urine: NEGATIVE
Glucose, UA: NEGATIVE mg/dL
Hgb urine dipstick: NEGATIVE
KETONES UR: NEGATIVE mg/dL
Leukocytes, UA: NEGATIVE
Nitrite: NEGATIVE
PH: 6 (ref 5.0–8.0)
Protein, ur: NEGATIVE mg/dL
SPECIFIC GRAVITY, URINE: 1.025 (ref 1.005–1.030)
UROBILINOGEN UA: 0.2 mg/dL (ref 0.0–1.0)

## 2014-10-28 MED ORDER — METOCLOPRAMIDE HCL 10 MG PO TABS
10.0000 mg | ORAL_TABLET | Freq: Once | ORAL | Status: AC
  Administered 2014-10-28: 10 mg via ORAL
  Filled 2014-10-28: qty 1

## 2014-10-28 MED ORDER — METOCLOPRAMIDE HCL 10 MG PO TABS: 10.0000 mg | ORAL_TABLET | Freq: Three times a day (TID) | ORAL | Status: DC

## 2014-10-28 MED ORDER — PENICILLIN G BENZATHINE 1200000 UNIT/2ML IM SUSP
1.2000 10*6.[IU] | Freq: Once | INTRAMUSCULAR | Status: AC
  Administered 2014-10-28: 1.2 10*6.[IU] via INTRAMUSCULAR
  Filled 2014-10-28: qty 2

## 2014-10-28 NOTE — Discharge Instructions (Signed)

## 2014-10-28 NOTE — MAU Note (Signed)
Pt. States she has had increased vomiting for the past 3 days. Is currently being treated with antibiotics for strep throat and is unable to keep medication down. Unable to keep any fluids or food down. Denies fever. Chills occasionally. Denies bleeding or discharge. Pt. States she tried to eat/drink around 0700. Pt. States she vomited 15 times small-moderate amounts of vomit in the past day. Denies diarrhea.

## 2014-10-28 NOTE — MAU Provider Note (Signed)
History     CSN: [id number]  Arrival date and time: 10/28/14 1327   First Provider Initiated Contact with Patient 10/28/14 1413      Chief Complaint  Patient presents with  . Emesis During Pregnancy  . Abdominal Pain   HPI  Jacqueline Clarke is a 29 y.o. [id number] at [redacted]w[redacted]d. She has had N&V x3-4d, vomiting ~ 10x/d. Today is vomiting every 20-30 min, can't seem to stop. She was dx with strept throat 9/30, hasn't been able to keep med down since vomiting started. She was followed for rising BHCG's, for spotting and cramping that has resolved, U/S scheduled next week. No vomiting or retching while I was in the room.  OB History    Gravida Para Term Preterm AB TAB SAB Ectopic Multiple Living   0 2 0 2 0  3      Past Medical History  Diagnosis Date  . Asthma   . Scoliosis   . Blood type, Rh negative   . Anemia   . Chronic back pain     scolosis- on percocet  . Hyperemesis complicating pregnancy, antepartum 2013  . Headache(784.0)   . Cholelithiasis   . Pregnancy induced hypertension   . Hypothyroidism   . 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 Obesity     Past Surgical History  Procedure Laterality Date  . Adenoidectomy    . Nerve, tendon and artery repair Right 06/18/2014    Procedure: NERVE, TENDON AND ARTERY REPAIR;  Surgeon: Bradly Bienenstock, MD;  Location: [facility] OR;  Service: Orthopedics;  Laterality: Right;  . Cholecystectomy    . Adenoidectomy    . Dilation and curettage of uterus  2008  . Arm surgery      Family History  Problem Relation Age of Onset  . Heart disease Maternal Grandfather   . Diabetes Maternal Grandfather   . Anesthesia problems Neg Hx     Social History  Substance Use Topics  . Smoking status: Former Smoker -- 0.50 packs/day    Types: Cigarettes    Quit date: 11/17/2010  . Smokeless tobacco: Never Used  . Alcohol Use: No     Comment: Occassionally    Allergies: No Known Allergies  Prescriptions prior to admission   Medication Sig Dispense Refill Last Dose  . amoxicillin (AMOXIL) 500 MG capsule Take 1 capsule (500 mg total) by mouth 2 (two) times daily. 20 capsule 0 10/23/2014 at Unknown time  . oxyCODONE-acetaminophen (PERCOCET) 10-325 MG per tablet Take 1 tablet by mouth every 4 (four) hours as needed for pain.   Past Week at Unknown time  . penicillin v potassium (VEETID) 250 MG tablet Take 1 tablet (250 mg total) by mouth 3 (three) times daily. 30 tablet 0 10/23/2014 at Unknown time  . prazosin (MINIPRESS) 2 MG capsule Take 2 mg by mouth at bedtime.  1 Past Month at Unknown time  . QUEtiapine (SEROQUEL) 100 MG tablet Take 100 mg by mouth at bedtime.   Past Month at Unknown time  . venlafaxine (EFFEXOR) 75 MG tablet Take 75 mg by mouth 2 (two) times daily.   Past Month at Unknown time  . Prenat-FeCbn-FeAspGl-FA-Omega (OB COMPLETE PETITE) 35-5-1-200 MG CAPS Take 1 capsule by mouth daily before breakfast. 90 capsule 3 Unknown at Unknown time    Review of Systems  Gastrointestinal: Positive for nausea and vomiting.  Neurological: Positive for weakness.   Physical Exam   Blood pressure 121/68,  pulse 64, temperature 98.6 F (37 C), temperature source Oral, resp. rate 18, last menstrual period 09/09/2014.  Physical Exam  Nursing note and vitals reviewed. Constitutional: She is oriented to person, place, and time. She appears well-developed and well-nourished.  Musculoskeletal: Normal range of motion.  Neurological: She is alert and oriented to person, place, and time.  Skin: Skin is warm and dry.  Psychiatric: She has a normal mood and affect. Her behavior is normal.    MAU Course  Procedures  MDM Results for orders placed or performed during the hospital encounter of 10/28/14 (from the past 24 hour(s))  Urinalysis, Routine w reflex microscopic (not at [facility])     Status: None   Collection Time: 10/28/14  1:52 PM  Result Value Ref Range   Color, Urine YELLOW YELLOW   APPearance CLEAR CLEAR    Specific Gravity, Urine 1.025 1.005 - 1.030   pH 6.0 5.0 - 8.0   Glucose, UA NEGATIVE NEGATIVE mg/dL   Hgb urine dipstick NEGATIVE NEGATIVE   Bilirubin Urine NEGATIVE NEGATIVE   Ketones, ur NEGATIVE NEGATIVE mg/dL   Protein, ur NEGATIVE NEGATIVE mg/dL   Urobilinogen, UA 0.2 0.0 - 1.0 mg/dL   Nitrite NEGATIVE NEGATIVE   Leukocytes, UA NEGATIVE NEGATIVE     Assessment and Plan  SG is 1.025, no ketones in urine. Behavioral changes for management of N&V reviewed. Will start Reglan 10 mg TID. If not effective and needs additional tx to call the office for Diclegis. PCN IM for strept throat since pt has not taken meds for past 3 d Call [facility] Monday for f/u  Rodell Perna. 10/28/2014, 2:36 PM

## 2014-10-28 NOTE — MAU Note (Addendum)
Pt states her vomiting is worse for the last 3 days, is unable to keep liquids down.  Lower abd pain started 2 hours ago, denies bleeding or discharge.  Pt on antibiotics for strep throat, unable to take her meds for the last 3 days.

## 2014-10-31 ENCOUNTER — Other Ambulatory Visit: Payer: Self-pay | Admitting: Obstetrics

## 2014-10-31 ENCOUNTER — Telehealth: Payer: Self-pay | Admitting: *Deleted

## 2014-10-31 DIAGNOSIS — O219 Vomiting of pregnancy, unspecified: Secondary | ICD-10-CM

## 2014-10-31 MED ORDER — PROMETHAZINE HCL 25 MG PO TABS: 25.0000 mg | ORAL_TABLET | Freq: Four times a day (QID) | ORAL | Status: DC | PRN

## 2014-10-31 NOTE — Telephone Encounter (Signed)
Patient states she was seen at the hospital for nausea and vomiting and given Reglan. It is not working. Told patient I would let Dr Clearance Coots know and see what we need to do for her.

## 2014-10-31 NOTE — Telephone Encounter (Signed)
Rx sent by Dr Clearance Coots- patient notified

## 2014-10-31 NOTE — Telephone Encounter (Signed)
We can try Phenergan

## 2014-11-01 ENCOUNTER — Inpatient Hospital Stay (HOSPITAL_COMMUNITY)
Admission: AD | Admit: 2014-11-01 | Discharge: 2014-11-07 | DRG: 781 | Disposition: A | Discharge status: Home / Self Care | Payer: Medicaid Other | Source: Ambulatory Visit | Attending: Obstetrics | Admitting: Obstetrics

## 2014-11-01 ENCOUNTER — Encounter (HOSPITAL_COMMUNITY): Payer: Self-pay

## 2014-11-01 ENCOUNTER — Telehealth: Payer: Self-pay | Admitting: *Deleted

## 2014-11-01 DIAGNOSIS — R634 Abnormal weight loss: Secondary | ICD-10-CM | POA: Diagnosis not present

## 2014-11-01 DIAGNOSIS — Z833 Family history of diabetes mellitus: Secondary | ICD-10-CM

## 2014-11-01 DIAGNOSIS — E440 Moderate protein-calorie malnutrition: Secondary | ICD-10-CM | POA: Insufficient documentation

## 2014-11-01 DIAGNOSIS — Z87891 Personal history of nicotine dependence: Secondary | ICD-10-CM | POA: Diagnosis not present

## 2014-11-01 DIAGNOSIS — Z8249 Family history of ischemic heart disease and other diseases of the circulatory system: Secondary | ICD-10-CM

## 2014-11-01 DIAGNOSIS — O210 Mild hyperemesis gravidarum: Secondary | ICD-10-CM | POA: Diagnosis present

## 2014-11-01 DIAGNOSIS — Z3A08 8 weeks gestation of pregnancy: Secondary | ICD-10-CM | POA: Diagnosis not present

## 2014-11-01 DIAGNOSIS — O131 Gestational [pregnancy-induced] hypertension without significant proteinuria, first trimester: Secondary | ICD-10-CM | POA: Diagnosis present

## 2014-11-01 DIAGNOSIS — O219 Vomiting of pregnancy, unspecified: Secondary | ICD-10-CM

## 2014-11-01 DIAGNOSIS — O211 Hyperemesis gravidarum with metabolic disturbance: Secondary | ICD-10-CM | POA: Diagnosis present

## 2014-11-01 LAB — COMPREHENSIVE METABOLIC PANEL
ALBUMIN: 4.1 g/dL (ref 3.5–5.0)
ALK PHOS: 76 U/L (ref 38–126)
ALT: 31 U/L (ref 14–54)
AST: 34 U/L (ref 15–41)
Anion gap: 9 (ref 5–15)
BILIRUBIN TOTAL: 1.2 mg/dL (ref 0.3–1.2)
BUN: 15 mg/dL (ref 6–20)
CALCIUM: 9.4 mg/dL (ref 8.9–10.3)
CO2: 22 mmol/L (ref 22–32)
CREATININE: 0.76 mg/dL (ref 0.44–1.00)
Chloride: 105 mmol/L (ref 101–111)
GFR calc Af Amer: >60 mL/min (ref >60)
GFR calc non Af Amer: >60 mL/min (ref >60)
GLUCOSE: 81 mg/dL (ref 65–99)
Potassium: 3.6 mmol/L (ref 3.5–5.1)
SODIUM: 136 mmol/L (ref 135–145)
TOTAL PROTEIN: 8.9 g/dL — AB (ref 6.5–8.1)

## 2014-11-01 LAB — URINE MICROSCOPIC-ADD ON

## 2014-11-01 LAB — TYPE AND SCREEN
ABO/RH(D): O NEG
Antibody Screen: NEGATIVE

## 2014-11-01 LAB — CBC
HEMATOCRIT: 38.6 % (ref 36.0–46.0)
HEMOGLOBIN: 12.6 g/dL (ref 12.0–15.0)
MCH: 25.8 pg — AB (ref 26.0–34.0)
MCHC: 32.6 g/dL (ref 30.0–36.0)
MCV: 78.9 fL (ref 78.0–100.0)
Platelets: 275 10*3/uL (ref 150–400)
RBC: 4.89 MIL/uL (ref 3.87–5.11)
RDW: 16.5 % — ABNORMAL HIGH (ref 11.5–15.5)
WBC: 9.8 10*3/uL (ref 4.0–10.5)

## 2014-11-01 LAB — URINALYSIS, ROUTINE W REFLEX MICROSCOPIC
GLUCOSE, UA: NEGATIVE mg/dL
Hgb urine dipstick: NEGATIVE
NITRITE: NEGATIVE
PH: 6 (ref 5.0–8.0)
Protein, ur: NEGATIVE mg/dL
Urobilinogen, UA: 4 mg/dL — ABNORMAL HIGH (ref 0.0–1.0)

## 2014-11-01 MED ORDER — LACTATED RINGERS IV BOLUS (SEPSIS): 1000.0000 mL | Freq: Once | INTRAVENOUS | Status: DC

## 2014-11-01 MED ORDER — ACETAMINOPHEN 325 MG PO TABS
650.0000 mg | ORAL_TABLET | ORAL | Status: DC | PRN
  Administered 2014-11-02 – 2014-11-04 (×3): 650 mg via ORAL
  Filled 2014-11-01 (×3): qty 2

## 2014-11-01 MED ORDER — ZOLPIDEM TARTRATE 5 MG PO TABS
5.0000 mg | ORAL_TABLET | Freq: Every evening | ORAL | Status: DC | PRN
  Administered 2014-11-03 – 2014-11-06 (×3): 5 mg via ORAL
  Filled 2014-11-01 (×3): qty 1

## 2014-11-01 MED ORDER — PROMETHAZINE HCL 25 MG/ML IJ SOLN
25.0000 mg | Freq: Once | INTRAMUSCULAR | Status: AC
  Administered 2014-11-01: 25 mg via INTRAVENOUS
  Filled 2014-11-01: qty 1

## 2014-11-01 MED ORDER — LACTATED RINGERS IV SOLN
INTRAVENOUS | Status: DC
Start: 2014-11-01 — End: 2014-11-04
  Administered 2014-11-01 – 2014-11-02 (×2): via INTRAVENOUS
  Administered 2014-11-02: 125 mL/h via INTRAVENOUS
  Administered 2014-11-03 (×2): via INTRAVENOUS
  Administered 2014-11-03: 125 mL/h via INTRAVENOUS
  Administered 2014-11-04 (×2): via INTRAVENOUS

## 2014-11-01 MED ORDER — M.V.I. ADULT IV INJ
INTRAVENOUS | Status: DC
  Filled 2014-11-01: qty 10

## 2014-11-01 MED ORDER — DEXTROSE 5 % IN LACTATED RINGERS IV BOLUS
1000.0000 mL | Freq: Once | INTRAVENOUS | Status: AC
  Administered 2014-11-01: 1000 mL via INTRAVENOUS

## 2014-11-01 MED ORDER — FAMOTIDINE IN NACL 20-0.9 MG/50ML-% IV SOLN
20.0000 mg | Freq: Two times a day (BID) | INTRAVENOUS | Status: DC
  Administered 2014-11-01 – 2014-11-03 (×4): 20 mg via INTRAVENOUS
  Filled 2014-11-01 (×4): qty 50

## 2014-11-01 MED ORDER — QUETIAPINE FUMARATE 100 MG PO TABS
100.0000 mg | ORAL_TABLET | Freq: Every day | ORAL | Status: DC
  Administered 2014-11-01 – 2014-11-06 (×6): 100 mg via ORAL
  Filled 2014-11-01 (×6): qty 1

## 2014-11-01 MED ORDER — PROMETHAZINE HCL 25 MG/ML IJ SOLN: 12.5000 mg | Freq: Four times a day (QID) | INTRAMUSCULAR | Status: DC | PRN

## 2014-11-01 MED ORDER — DOCUSATE SODIUM 100 MG PO CAPS
100.0000 mg | ORAL_CAPSULE | Freq: Every day | ORAL | Status: DC
  Administered 2014-11-03 – 2014-11-07 (×5): 100 mg via ORAL
  Filled 2014-11-01 (×6): qty 1

## 2014-11-01 MED ORDER — CALCIUM CARBONATE ANTACID 500 MG PO CHEW
2.0000 | CHEWABLE_TABLET | ORAL | Status: DC | PRN
Start: 2014-11-01 — End: 2014-11-07
  Administered 2014-11-02 – 2014-11-04 (×3): 400 mg via ORAL
  Filled 2014-11-01 (×4): qty 2

## 2014-11-01 MED ORDER — PROMETHAZINE HCL 25 MG/ML IJ SOLN
12.5000 mg | Freq: Four times a day (QID) | INTRAMUSCULAR | Status: DC | PRN
  Administered 2014-11-02 – 2014-11-04 (×3): 12.5 mg via INTRAVENOUS
  Filled 2014-11-01 (×3): qty 1

## 2014-11-01 MED ORDER — M.V.I. ADULT IV INJ
INTRAVENOUS | Status: DC
  Administered 2014-11-02 – 2014-11-03 (×3): via INTRAVENOUS
  Filled 2014-11-01 (×4): qty 10

## 2014-11-01 MED ORDER — PRAZOSIN HCL 1 MG PO CAPS
2.0000 mg | ORAL_CAPSULE | Freq: Every day | ORAL | Status: DC
  Administered 2014-11-01 – 2014-11-06 (×6): 2 mg via ORAL
  Filled 2014-11-01 (×7): qty 2

## 2014-11-01 NOTE — MAU Note (Signed)
Patient states still nausea no active vomiting. IV on pump infusing

## 2014-11-01 NOTE — H&P (Signed)
Jacqueline Clarke is a 29 y.o. female presenting for Nausea ad vomiting, can't keep anything down.  Getting weak.  Seventeen pound weight loss.. Maternal Medical History:  Reason for admission: Nausea.   Prenatal complications: Nausea and vomiting  Prenatal Complications - Diabetes: none.    OB History    Gravida Para Term Preterm AB TAB SAB Ectopic Multiple Living   6 3 3  0 2 0 2 0  3     Past Medical History  Diagnosis Date  . Asthma   . Scoliosis   . Blood type, Rh negative   . Anemia   . Chronic back pain     scolosis- on percocet  . Hyperemesis complicating pregnancy, antepartum 2013  . Headache(784.0)   . Cholelithiasis   . Pregnancy induced hypertension   . Hypothyroidism   . 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 Obesity    Past Surgical History  Procedure Laterality Date  . Adenoidectomy    . Nerve, tendon and artery repair Right 06/18/2014    Procedure: NERVE, TENDON AND ARTERY REPAIR;  Surgeon: Bradly BienenstockFred Ortmann, MD;  Location: [facility] OR;  Service: Orthopedics;  Laterality: Right;  . Cholecystectomy    . Adenoidectomy    . Dilation and curettage of uterus  2008  . Arm surgery     Family History: family history includes Diabetes in her maternal grandfather; Heart disease in her maternal grandfather. There is no history of Anesthesia problems. Social History:  reports that she quit smoking about 3 years ago. Her smoking use included Cigarettes. She smoked 0.50 packs per day. She has never used smokeless tobacco. She reports that she does not drink alcohol or use illicit drugs.   Prenatal Transfer Tool  Maternal Diabetes: No Genetic Screening: n/a Maternal Ultrasounds/Referrals: Normal Fetal Ultrasounds or other Referrals:  Referred to Materal Fetal Medicine  Maternal Substance Abuse:  No Significant Maternal Medications:  Meds include: Other: Phenergan Significant Maternal Lab Results:  None Other Comments:  None  Review of Systems   Constitutional: Positive for weight loss and malaise/fatigue.  Gastrointestinal: Positive for nausea and vomiting.  Neurological: Positive for weakness.      Blood pressure 111/77, pulse 77, temperature 98.7 F (37.1 C), temperature source Oral, resp. rate 16, height 5\' 8"  (1.727 m), weight 243 lb (110.224 kg), last menstrual period 09/09/2014. Maternal Exam:  Abdomen: Patient reports no abdominal tenderness.   Physical Exam  Nursing note and vitals reviewed. Constitutional: She is oriented to person, place, and time. She appears well-developed and well-nourished.  HENT:  Head: Normocephalic and atraumatic.  Eyes: Conjunctivae are normal. Pupils are equal, round, and reactive to light.  Neck: Normal range of motion. Neck supple.  Cardiovascular: Normal rate and regular rhythm.   Respiratory: Effort normal and breath sounds normal.  GI: Soft.  Musculoskeletal: Normal range of motion.  Neurological: She is alert and oriented to person, place, and time.  Skin: Skin is warm and dry.  Psychiatric: She has a normal mood and affect. Her behavior is normal.    Prenatal labs: ABO, Rh: --/--/O NEG (10/12 1900) Antibody: NEG (10/12 1900) Rubella:   RPR:    HBsAg:    HIV:    GBS:     Assessment/Plan: 7 weeks.  Hyperemesis Gravidarum.  17 lb. weight loss.  Admit for supportive management.   Jatavious Peppard A 11/01/2014, 8:48 PM

## 2014-11-01 NOTE — Progress Notes (Signed)
Called women's unit and gave report to charge nurse on patient. Patient is being moved to room 306 but women's unit will need some time.

## 2014-11-01 NOTE — Telephone Encounter (Signed)
Patient is still throwing up- can't keep anything down. 2:00 Call to patient- she is unable to keep medication down long enough for it to work. She feel weak and is falling when she trys to walk. She is going to MAU for evaluation. Dr Clearance CootsHarper notified.

## 2014-11-01 NOTE — MAU Provider Note (Signed)
Chief Complaint: Emesis During Pregnancy   First Provider Initiated Contact with Patient 11/01/14 1530      SUBJECTIVE HPI: Jacqueline Clarke is a 29 y.o. [id number] at 2176w4d by LMP who presents to maternity admissions reporting nausea with vomiting >10 x daily, worsening in last couple of days.  She was prescribed Reglan in MAU which is not working and started Phenergan yesterday from Dr Clearance CootsHarper which is also not helping.  She took a PO dose of Phenergan at 9 am this morning.  She reports 20 lb weight loss in last 2 weeks. She denies abdominal pain, vaginal bleeding, vaginal itching/burning, urinary symptoms, h/a, dizziness, n/v, or fever/chills.     Emesis  This is a recurrent problem. The current episode started 1 to 4 weeks ago. The problem occurs more than 10 times per day. The problem has been gradually worsening. The emesis has an appearance of stomach contents and bile. There has been no fever. Associated symptoms include weight loss. Pertinent negatives include no abdominal pain, chest pain, chills, diarrhea, dizziness, fever or headaches. She has tried diet change (Reglan, Phenergan) for the symptoms. The treatment provided no relief.    Past Medical History  Diagnosis Date  . Asthma   . Scoliosis   . Blood type, Rh negative   . Anemia   . Chronic back pain     scolosis- on percocet  . Hyperemesis complicating pregnancy, antepartum 2013  . Headache(784.0)   . Cholelithiasis   . Pregnancy induced hypertension   . Hypothyroidism   . 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 Obesity    Past Surgical History  Procedure Laterality Date  . Adenoidectomy    . Nerve, tendon and artery repair Right 06/18/2014    Procedure: NERVE, TENDON AND ARTERY REPAIR;  Surgeon: Bradly BienenstockFred Ortmann, MD;  Location: [facility] OR;  Service: Orthopedics;  Laterality: Right;  . Cholecystectomy    . Adenoidectomy    . Dilation and curettage of uterus  2008  . Arm surgery     Social History   Social  History  . Marital Status: Single    Spouse Name: N/A  . Number of Children: N/A  . Years of Education: N/A   Occupational History  . Not on file.   Social History Main Topics  . Smoking status: Former Smoker -- 0.50 packs/day    Types: Cigarettes    Quit date: 11/17/2010  . Smokeless tobacco: Never Used  . Alcohol Use: No     Comment: Occassionally  . Drug Use: No  . Sexual Activity: Yes     Comment: nexplanon being removed 08-21-14   Other Topics Concern  . Not on file   Social History Narrative   ** Merged History Encounter **       No current facility-administered medications on file prior to encounter.   Current Outpatient Prescriptions on File Prior to Encounter  Medication Sig Dispense Refill  . metoCLOPramide (REGLAN) 10 MG tablet Take 1 tablet (10 mg total) by mouth 3 (three) times daily before meals. 90 tablet 0  . prazosin (MINIPRESS) 2 MG capsule Take 2 mg by mouth at bedtime.  1  . Prenat-FeCbn-FeAspGl-FA-Omega (OB COMPLETE PETITE) 35-5-1-200 MG CAPS Take 1 capsule by mouth daily before breakfast. 90 capsule 3  . QUEtiapine (SEROQUEL) 100 MG tablet Take 100 mg by mouth at bedtime.     No Known Allergies  ROS:  Review of Systems  Constitutional: Positive for weight loss.  Negative for fever, chills and fatigue.  HENT: Negative for sinus pressure.   Eyes: Negative for photophobia.  Respiratory: Negative for shortness of breath.   Cardiovascular: Negative for chest pain.  Gastrointestinal: Positive for vomiting. Negative for nausea, abdominal pain, diarrhea and constipation.  Genitourinary: Negative for dysuria, frequency, flank pain, vaginal bleeding, vaginal discharge, difficulty urinating, vaginal pain and pelvic pain.  Musculoskeletal: Negative for neck pain.  Neurological: Negative for dizziness, weakness and headaches.  Psychiatric/Behavioral: Negative.      I have reviewed patient's Past Medical Hx, Surgical Hx, Family Hx, Social Hx, medications and  allergies.   Physical Exam   Patient Vitals for the past 24 hrs:  BP Temp Temp src Pulse Resp Height Weight  11/01/14 1508 111/77 mmHg 98.7 F (37.1 C) Oral 77 16  (1.727 m) 110.224 kg (243 lb)   Pt weight on 9/28 was 260 lbs, 243 lbs today  Constitutional: Well-developed, well-nourished female in mild distress.  Cardiovascular: normal rate Respiratory: normal effort GI: Abd soft, non-tender. Pos BS x 4 MS: Extremities nontender, no edema, normal ROM Neurologic: Alert and oriented x 4.  GU: Neg CVAT.   LAB RESULTS Results for orders placed or performed during the hospital encounter of 11/01/14 (from the past 24 hour(s))  Urinalysis, Routine w reflex microscopic (not at [facility])     Status: Abnormal   Collection Time: 11/01/14  3:15 PM  Result Value Ref Range   Color, Urine AMBER (A) YELLOW   APPearance CLEAR CLEAR   Specific Gravity, Urine >1.030 (H) 1.005 - 1.030   pH 6.0 5.0 - 8.0   Glucose, UA NEGATIVE NEGATIVE mg/dL   Hgb urine dipstick NEGATIVE NEGATIVE   Bilirubin Urine MODERATE (A) NEGATIVE   Ketones, ur >80 (A) NEGATIVE mg/dL   Protein, ur NEGATIVE NEGATIVE mg/dL   Urobilinogen, UA 4.0 (H) 0.0 - 1.0 mg/dL   Nitrite NEGATIVE NEGATIVE   Leukocytes, UA TRACE (A) NEGATIVE  Urine microscopic-add on     Status: Abnormal   Collection Time: 11/01/14  3:15 PM  Result Value Ref Range   Squamous Epithelial / LPF FEW (A) RARE   WBC, UA 3-6 <3 WBC/hpf   Urine-Other MUCOUS PRESENT   CBC     Status: Abnormal   Collection Time: 11/01/14  4:25 PM  Result Value Ref Range   WBC 9.8 4.0 - 10.5 K/uL   RBC 4.89 3.87 - 5.11 MIL/uL   Hemoglobin 12.6 12.0 - 15.0 g/dL   HCT 16.1 09.6 - 04.5 %   MCV 78.9 78.0 - 100.0 fL   MCH 25.8 (L) 26.0 - 34.0 pg   MCHC 32.6 30.0 - 36.0 g/dL   RDW 40.9 (H) 81.1 - 91.4 %   Platelets 275 150 - 400 K/uL  Comprehensive metabolic panel     Status: Abnormal   Collection Time: 11/01/14  4:25 PM  Result Value Ref Range   Sodium 136 135 - 145  mmol/L   Potassium 3.6 3.5 - 5.1 mmol/L   Chloride 105 101 - 111 mmol/L   CO2 22 22 - 32 mmol/L   Glucose, Bld 81 65 - 99 mg/dL   BUN 15 6 - 20 mg/dL   Creatinine, Ser 7.82 0.44 - 1.00 mg/dL   Calcium 9.4 8.9 - 95.6 mg/dL   Total Protein 8.9 (H) 6.5 - 8.1 g/dL   Albumin 4.1 3.5 - 5.0 g/dL   AST 34 15 - 41 U/L   ALT 31 14 - 54 U/L   Alkaline  Phosphatase 76 38 - 126 U/L   Total Bilirubin 1.2 0.3 - 1.2 mg/dL   GFR calc non Af Amer >60 >60 mL/min   GFR calc Af Amer >60 >60 mL/min   Anion gap 9 5 - 15    --/--/O NEG (09/28 1055)   MAU Management/MDM: Ordered labs and reviewed results.  Consult Dr Clearance Coots to review assessment, labs and pt hx.  Treatments in MAU included D5LR x 1000 ml and Phenergan 25 mg IV.  Pt reports nausea remains, vomited x 1-2 after medication given.   ASSESSMENT 1. Nausea and vomiting during pregnancy prior to [redacted] weeks gestation   2. Hyperemesis complicating pregnancy, antepartum     PLAN Admit to Women's Unit IV fluids Phenergan 12.5 mg IV Q 6 hours PRN Nutrition consult     Medication List    ASK your doctor about these medications        metoCLOPramide 10 MG tablet  Commonly known as:  REGLAN  Take 1 tablet (10 mg total) by mouth 3 (three) times daily before meals.     OB COMPLETE PETITE 35-5-1-200 MG Caps  Take 1 capsule by mouth daily before breakfast.     prazosin 2 MG capsule  Commonly known as:  MINIPRESS  Take 2 mg by mouth at bedtime.     promethazine 25 MG tablet  Commonly known as:  PHENERGAN  Take 1 tablet (25 mg total) by mouth every 6 (six) hours as needed for nausea.     SEROQUEL 100 MG tablet  Generic drug:  QUEtiapine  Take 100 mg by mouth at bedtime.         Sharen Counter Certified Nurse-Midwife 11/01/2014  6:43 PM

## 2014-11-01 NOTE — MAU Note (Addendum)
Pt seen in MAU 3 days ago for vomiting, was prescribed reglan in MAU - wasn't working so Dr. Clearance CootsHarper prescribed phenergan yesterday.  Pt states it isn't working either, last taken @ 0900 this morning.  Pt vomiting in triage.  Denies pain or bleeding.  Pt states she fell at home because she was dizzy, did not lose consciousness.

## 2014-11-02 MED ORDER — LACTATED RINGERS IV BOLUS (SEPSIS)
1000.0000 mL | Freq: Once | INTRAVENOUS | Status: AC
  Administered 2014-11-02: 1000 mL via INTRAVENOUS

## 2014-11-02 MED ORDER — BOOST / RESOURCE BREEZE PO LIQD
1.0000 | Freq: Three times a day (TID) | ORAL | Status: DC
  Administered 2014-11-02 – 2014-11-05 (×5): 1 via ORAL
  Administered 2014-11-06: 10:00:00 via ORAL
  Filled 2014-11-02 (×15): qty 1

## 2014-11-02 NOTE — Progress Notes (Signed)
Notified Dr. Clearance CootsHarper that pt was c/o dizziness/lightheadedness. Order received for 1L bolus of LR. Also notified him that patient has been tolerating clear liquids well throughout the night and today, order received to advance pt to [employer] diet. Sheryn BisonGordon, Eriyah Fernando Warner

## 2014-11-02 NOTE — Plan of Care (Signed)
Problem: Phase II Progression Outcomes Goal: Tolerating diet Outcome: Completed/Met Date Met:  11/02/14 Tolerating clear liquids

## 2014-11-02 NOTE — Progress Notes (Signed)
Initial Nutrition Assessment  DOCUMENTATION CODES:   Non-severe (moderate) malnutrition in context of acute illness/injury Meets ASPEN criteria for dx of moderate degree of malnutrition due to a 6.3 % loss of usual weight  INTERVENTION:  C/L diet, Boost Breeze trial   NUTRITION DIAGNOSIS:   Inadequate oral intake related to nausea, vomiting as evidenced by percent weight loss.  GOAL:   Weight gain  MONITOR:   Weight trends  REASON FOR ASSESSMENT:    (Hyperemesis)    ASSESSMENT:   7 5/7 weeks IUP, hyperemesis with 17 lb weight loss. Hyperemesis with previous pregnancies, but later in pregnancy. Has tolerated C/L this AM. Consents to trial [employer] Boost Breeze supplement . Reviewed diet for hyperemesis with pt, who was already familar with it from prev preg.   Diet Order:  Diet clear liquid Room service appropriate?: Yes; Fluid consistency:: Thin  Skin:  Reviewed, no issues  Height:   Ht Readings from Last 1 Encounters:  11/01/14 5\' 8"  (1.727 m)    Weight:   Wt Readings from Last 1 Encounters:  11/02/14 247 lb (112.038 kg)   Usual weight 264 lbs Ideal Body Weight:  63.6 kg  BMI:  Body mass index is 37.56 kg/(m^2).  Estimated Nutritional Needs:   Kcal:  2200-2400  Protein:  85-95 g  Fluid:  2.4 L  EDUCATION NEEDS:   No education needs identified at this time  Inez PilgrimKatherine Marven Veley M.Odis LusterEd. R.D. LDN Neonatal Nutrition Support Specialist/RD III Pager [phone]      Phone [phone]

## 2014-11-03 DIAGNOSIS — E440 Moderate protein-calorie malnutrition: Secondary | ICD-10-CM | POA: Insufficient documentation

## 2014-11-03 MED ORDER — GI COCKTAIL ~~LOC~~
30.0000 mL | Freq: Once | ORAL | Status: AC
Start: 2014-11-03 — End: 2014-11-03
  Administered 2014-11-03: 30 mL via ORAL
  Filled 2014-11-03: qty 30

## 2014-11-03 MED ORDER — FAMOTIDINE 20 MG PO TABS
20.0000 mg | ORAL_TABLET | Freq: Two times a day (BID) | ORAL | Status: DC
  Administered 2014-11-03 – 2014-11-07 (×8): 20 mg via ORAL
  Filled 2014-11-03 (×8): qty 1

## 2014-11-03 NOTE — Plan of Care (Signed)
Problem: Phase II Progression Outcomes Goal: Progress activity as tolerated unless otherwise ordered Outcome: Completed/Met Date Met:  11/03/14 Ambulating in room, showered.

## 2014-11-03 NOTE — Progress Notes (Signed)
The patient is receiving pepcid by the intravenous route.  Based on criteria approved by the Pharmacy and Therapeutics Committee and the Medical Executive Committee, the medication is being converted to the equivalent oral dose form.  These criteria include: -No Active GI bleeding -Able to tolerate diet of full liquids (or better) or tube feeding -Able to tolerate other medications by the oral or enteral route  If you have any questions about this conversion, please contact the Pharmacy Department (ext [phone]).  Thank you.  Natasha BenceCline, Zoii Florer 11/03/2014

## 2014-11-04 MED ORDER — PROMETHAZINE HCL 25 MG PO TABS
25.0000 mg | ORAL_TABLET | ORAL | Status: DC | PRN
Start: 2014-11-04 — End: 2014-11-04

## 2014-11-04 MED ORDER — ONDANSETRON HCL 4 MG PO TABS
4.0000 mg | ORAL_TABLET | ORAL | Status: DC | PRN
  Filled 2014-11-04: qty 1

## 2014-11-04 MED ORDER — OXYCODONE-ACETAMINOPHEN 5-325 MG PO TABS
1.0000 | ORAL_TABLET | ORAL | Status: DC | PRN
  Administered 2014-11-04 – 2014-11-06 (×2): 1 via ORAL
  Filled 2014-11-04 (×2): qty 1

## 2014-11-04 MED ORDER — PROMETHAZINE HCL 25 MG PO TABS
25.0000 mg | ORAL_TABLET | Freq: Four times a day (QID) | ORAL | Status: DC | PRN
  Administered 2014-11-04: 25 mg via ORAL
  Filled 2014-11-04: qty 1

## 2014-11-04 MED ORDER — SIMETHICONE 80 MG PO CHEW
80.0000 mg | CHEWABLE_TABLET | Freq: Four times a day (QID) | ORAL | Status: DC | PRN
  Administered 2014-11-04: 80 mg via ORAL
  Filled 2014-11-04: qty 1

## 2014-11-04 MED ORDER — PRENATAL MULTIVITAMIN CH: 1.0000 | ORAL_TABLET | Freq: Every day | ORAL | Status: DC

## 2014-11-04 MED ORDER — PRENATAL MULTIVITAMIN CH
1.0000 | ORAL_TABLET | Freq: Every day | ORAL | Status: DC
  Administered 2014-11-04 – 2014-11-06 (×3): 1 via ORAL
  Filled 2014-11-04 (×3): qty 1

## 2014-11-04 NOTE — Plan of Care (Signed)
Problem: Discharge Progression Outcomes Goal: Tolerating diet Outcome: Progressing Tolerating regular diet with no complaints of nausea/vomiting.

## 2014-11-04 NOTE — Progress Notes (Addendum)
1744 - Patient c/o abdominal cramping on left side.  Patient reports that the cramping started after eating her evening meal (greens, cornbread, macaroni cheese, chocolate cake, juice).  [id number] - Dr. Gaynell FaceMarshall notified and gave order for patient to receive Zofran 4 mg and Mylicon 80 mg (see orders).  11808 - Dr. Gaynell FaceMarshall discontinued Zofran 4 mg and ordered Phenergan 25 mg prn.

## 2014-11-04 NOTE — Progress Notes (Addendum)
[id number] - Patient reports nausea now gone.  Reports pain has improved from 10/10 to 6/10.  Reports pain as cramping/pain/pressure 6/10.  Dr. Gaynell FaceMarshall notified.  Gave order for patient to receive Percocet 5-325 mg po one every four hours as needed for pain.  Patient denies vaginal bleeding.  Urine clear yellow.

## 2014-11-04 NOTE — Progress Notes (Signed)
Patient tolerating regular diet with no nausea/vomiting.  Dr. Gaynell FaceMarshall notified.  Gave order to discontinue LR at 125 ml/hr and saline lock.  Also gave order to discontinue IV MVI and start prenatal vitamin po once a day.

## 2014-11-04 NOTE — Progress Notes (Signed)
Patient ID: Jacqueline Clarke, female   DOB: 12/26/1985, 29 y.o.   MRN: [id number] Blood pressure 131/60 respiration 20 pulse 75 afebrile Patient had regular food last night and tolerated this quite well with continued regular food today

## 2014-11-04 NOTE — Progress Notes (Signed)
Patient tolerating regular diet (cheeseburger with bacon, sandwich-chicken) during night. No complaints on nausea, vomiting or epigastric pain.  Tolerating po Pepcid.

## 2014-11-05 NOTE — Progress Notes (Signed)
Pt tolerating diet, denies N/V, or pain.

## 2014-11-05 NOTE — Progress Notes (Signed)
Patient ID: Jacqueline Clarke, female   DOB: 11-17-1985, 29 y.o.   MRN: [id number] Blood blood pressure 112/68 respiration 18 pulse 80 afebrile Patient has been on a regular diet and has tolerated that with no problem Her abdominal pain that she had yesterday has no disappeared doing well

## 2014-11-06 DIAGNOSIS — O211 Hyperemesis gravidarum with metabolic disturbance: Secondary | ICD-10-CM | POA: Diagnosis present

## 2014-11-06 NOTE — Progress Notes (Signed)
Nutriton Follow-up Improved tolerance of po diet. Weight up 13 lbs from admission weight. Resource [employer] not being consumed, will discontinue  Henry ScheinKatherine Felicite Zeimet M.Odis LusterEd. R.D. LDN Neonatal Nutrition Support Specialist/RD III Pager [phone]      Phone [phone]

## 2014-11-06 NOTE — Progress Notes (Signed)
Patient ID: Jacqueline Clarke, female   DOB: [birth year], 29 y.o.   MRN: [id number] Hospital Day: 6  S: Tolerating diet.  Less nausea.  Vomited x 1 yesterday.  O: Blood pressure 105/71, pulse 76, temperature 98.8 F (37.1 C), temperature source Oral, resp. rate 18, height 5\' 8"  (1.727 m), weight 260 lb 4 oz (118.049 kg), last menstrual period 09/09/2014, SpO2 100 %.   FHT: n/a at 8 weeks Toco: None SVE:   A/P- 29 y.o. admitted with: Nausea, vomiting and dehydration.  17 lb. Weight loss.  Responding well to supportive management.  Plan discharge home tomorrow..  Present on Admission:  **None**  Pregnancy Complications: Hyperemesis  Preterm labor management: no treatment necessary Dating:  7258w2d PNL Needed:  none FWB:  good PTL:  none

## 2014-11-07 MED ORDER — SIMETHICONE 80 MG PO CHEW: 80.0000 mg | CHEWABLE_TABLET | Freq: Four times a day (QID) | ORAL | Status: DC | PRN

## 2014-11-07 MED ORDER — PROMETHAZINE HCL 25 MG PO TABS: 25.0000 mg | ORAL_TABLET | Freq: Four times a day (QID) | ORAL | Status: DC | PRN

## 2014-11-07 MED ORDER — CALCIUM CARBONATE ANTACID 500 MG PO CHEW: 2.0000 | CHEWABLE_TABLET | Freq: Three times a day (TID) | ORAL | Status: DC

## 2014-11-07 MED ORDER — FAMOTIDINE 20 MG PO TABS: 20.0000 mg | ORAL_TABLET | Freq: Two times a day (BID) | ORAL | Status: DC

## 2014-11-07 MED ORDER — ACETAMINOPHEN 325 MG PO TABS: 650.0000 mg | ORAL_TABLET | ORAL | Status: DC | PRN

## 2014-11-07 MED ORDER — GABAPENTIN 600 MG PO TABS: 600.0000 mg | ORAL_TABLET | Freq: Every day | ORAL | Status: DC

## 2014-11-07 MED ORDER — QUETIAPINE FUMARATE 100 MG PO TABS: 100.0000 mg | ORAL_TABLET | Freq: Every day | ORAL | Status: DC

## 2014-11-07 MED ORDER — DOCUSATE SODIUM 100 MG PO CAPS: 100.0000 mg | ORAL_CAPSULE | Freq: Every day | ORAL | Status: DC

## 2014-11-07 NOTE — Discharge Instructions (Signed)
Hyperemesis Gravidarum °Hyperemesis gravidarum is a severe form of nausea and vomiting that happens during pregnancy. Hyperemesis is worse than morning sickness. It may cause you to have nausea or vomiting all day for many days. It may keep you from eating and drinking enough food and liquids. Hyperemesis usually occurs during the first half (the first 20 weeks) of pregnancy. It often goes away once a woman is in her second half of pregnancy. However, sometimes hyperemesis continues through an entire pregnancy.  °CAUSES  °The cause of this condition is not completely known but is thought to be related to changes in the body's hormones when pregnant. It could be from the high level of the pregnancy hormone or an increase in estrogen in the body.  °SIGNS AND SYMPTOMS  °· Severe nausea and vomiting. °· Nausea that does not go away. °· Vomiting that does not allow you to keep any food down. °· Weight loss and body fluid loss (dehydration). °· Having no desire to eat or not liking food you have previously enjoyed. °DIAGNOSIS  °Your health care provider will do a physical exam and ask you about your symptoms. He or she may also order blood tests and urine tests to make sure something else is not causing the problem.  °TREATMENT  °You may only need medicine to control the problem. If medicines do not control the nausea and vomiting, you will be treated in the hospital to prevent dehydration, increased acid in the blood (acidosis), weight loss, and changes in the electrolytes in your body that may harm the unborn baby (fetus). You may need IV fluids.  °HOME CARE INSTRUCTIONS  °· Only take over-the-counter or prescription medicines as directed by your health care provider. °· Try eating a couple of dry crackers or toast in the morning before getting out of bed. °· Avoid foods and smells that upset your stomach. °· Avoid fatty and spicy foods. °· Eat 5-6 small meals a day. °· Do not drink when eating meals. Drink between  meals. °· For snacks, eat high-protein foods, such as cheese. °· Eat or suck on things that have ginger in them. Ginger helps nausea. °· Avoid food preparation. The smell of food can spoil your appetite. °· Avoid iron pills and iron in your multivitamins until after 3-4 months of being pregnant. However, consult with your health care provider before stopping any prescribed iron pills. °SEEK MEDICAL CARE IF:  °· Your abdominal pain increases. °· You have a severe headache. °· You have vision problems. °· You are losing weight. °SEEK IMMEDIATE MEDICAL CARE IF:  °· You are unable to keep fluids down. °· You vomit blood. °· You have constant nausea and vomiting. °· You have excessive weakness. °· You have extreme thirst. °· You have dizziness or fainting. °· You have a fever or persistent symptoms for more than 2-3 days. °· You have a fever and your symptoms suddenly get worse. °MAKE SURE YOU:  °· Understand these instructions. °· Will watch your condition. °· Will get help right away if you are not doing well or get worse. °  °This information is not intended to replace advice given to you by your health care provider. Make sure you discuss any questions you have with your health care provider. °  °Document Released: 01/06/2005 Document Revised: 10/27/2012 Document Reviewed: 08/18/2012 °Elsevier Interactive Patient Education ©2016 [employer] ° °Eating Plan for Hyperemesis Gravidarum °Severe cases of hyperemesis gravidarum can lead to dehydration and malnutrition. The hyperemesis eating plan is   one way to lessen the symptoms of nausea and vomiting. It is often used with prescribed medicines to control your symptoms.  °WHAT CAN I DO TO RELIEVE MY SYMPTOMS? °Listen to your body. Everyone is different and has different preferences. Find what works best for you. Some of the following things may help: °· Eat and drink slowly. °· Eat 5-6 small meals daily instead of 3 large meals.   °· Eat crackers before you get out of  bed in the morning.   °· Starchy foods are usually well tolerated (such as cereal, toast, bread, potatoes, pasta, rice, and pretzels).   °· Ginger may help with nausea. Add ¼ tsp ground ginger to hot tea or choose ginger tea.   °· Try drinking 100% fruit juice or an electrolyte drink. °· Continue to take your prenatal vitamins as directed by your health care provider. If you are having trouble taking your prenatal vitamins, talk with your health care provider about different options. °· Include at least 1 serving of protein with your meals and snacks (such as meats or poultry, beans, nuts, eggs, or yogurt). Try eating a protein-rich snack before bed (such as cheese and crackers or a half turkey or peanut butter sandwich). °WHAT THINGS SHOULD I AVOID TO REDUCE MY SYMPTOMS? °The following things may help reduce your symptoms: °· Avoid foods with strong smells. Try eating meals in well-ventilated areas that are free of odors. °· Avoid drinking water or other beverages with meals. Try not to drink anything less than 30 minutes before and after meals. °· Avoid drinking more than 1 cup of fluid at a time. °· Avoid fried or high-fat foods, such as butter and cream sauces. °· Avoid spicy foods. °· Avoid skipping meals the best you can. Nausea can be more intense on an empty stomach. If you cannot tolerate food at that time, do not force it. Try sucking on ice chips or other frozen items and make up the calories later. °· Avoid lying down within 2 hours after eating. °  °This information is not intended to replace advice given to you by your health care provider. Make sure you discuss any questions you have with your health care provider. °  °Document Released: 11/03/2006 Document Revised: 01/11/2013 Document Reviewed: 11/10/2012 °Elsevier Interactive Patient Education ©2016 [employer] ° ° °

## 2014-11-07 NOTE — Progress Notes (Signed)
Discharge teaching complete. Pt understood all information and did not have any questions. Pt ambulated out of the hospital and discharged home to family.  

## 2014-11-07 NOTE — Discharge Summary (Signed)
OB Discharge Summary     Patient Name: Jacqueline Clarke DOB: [birth year] MRN: [id number]  Date of admission: 11/01/2014  Date of discharge: 11/07/2014  Admitting diagnosis: 7WKS,VOMITING Intrauterine pregnancy: 5373w3d     Secondary diagnosis: Hyperemesis Gravidum     Discharge diagnosis: Hyperemesis Gravidum                                                                                          Complications: 6 lb. Weight loss   Hospital course: Uneventful  Physical exam  Filed Vitals:   11/06/14 1132 11/06/14 1728 11/06/14 2146 11/07/14 0600  BP: 105/71 106/59 115/58 115/71  Pulse: 76 80 86 93  Temp: 98.8 F (37.1 C) 98.7 F (37.1 C) 99.6 F (37.6 C) 98.6 F (37 C)  TempSrc: Oral Oral    Resp: 18 18 18 20   Height:      Weight:    260 lb (117.935 kg)  SpO2: 100% 100%     General: alert, cooperative and no distress Lochia: none Uterine Fundus: below symphysis pubis Incision: N/A DVT Evaluation: No evidence of DVT seen on physical exam. No cords or calf tenderness. No significant calf/ankle edema. Labs: Lab Results  Component Value Date   WBC 9.8 11/01/2014   HGB 12.6 11/01/2014   HCT 38.6 11/01/2014   MCV 78.9 11/01/2014   PLT 275 11/01/2014   CMP Latest Ref Rng 11/01/2014  Glucose 65 - 99 mg/dL 81  BUN 6 - 20 mg/dL 15  Creatinine 4.400.44 - 1.021.00 mg/dL 7.250.76  Sodium 366135 - 440145 mmol/L 136  Potassium 3.5 - 5.1 mmol/L 3.6  Chloride 101 - 111 mmol/L 105  CO2 22 - 32 mmol/L 22  Calcium 8.9 - 10.3 mg/dL 9.4  Total Protein 6.5 - 8.1 g/dL 8.9(H)  Total Bilirubin 0.3 - 1.2 mg/dL 1.2  Alkaline Phos 38 - 126 U/L 76  AST 15 - 41 U/L 34  ALT 14 - 54 U/L 31    Discharge instruction: [facility] for uncontrolled vomiting for 24 hours, vaginal bleeding, severe abdominal cramping, fever.   Medications:  Current facility-administered medications:  .  acetaminophen (TYLENOL) tablet 650 mg, 650 mg, Oral, Q4H PRN, Wilmer FloorLisa A Leftwich-Kirby, CNM, 650 mg at  11/04/14 0137 .  calcium carbonate (TUMS - dosed in mg elemental calcium) chewable tablet 400 mg of elemental calcium, 2 tablet, Oral, Q4H PRN, Wilmer FloorLisa A Leftwich-Kirby, CNM, 400 mg of elemental calcium at 11/04/14 0137 .  docusate sodium (COLACE) capsule 100 mg, 100 mg, Oral, Daily, Lisa A Leftwich-Kirby, CNM, 100 mg at 11/06/14 1009 .  famotidine (PEPCID) tablet 20 mg, 20 mg, Oral, BID, Brock Badharles A Harper, MD, 20 mg at 11/06/14 2146 .  oxyCODONE-acetaminophen (PERCOCET/ROXICET) 5-325 MG per tablet 1 tablet, 1 tablet, Oral, Q4H PRN, Kathreen CosierBernard A Marshall, MD, 1 tablet at 11/06/14 2249 .  prazosin (MINIPRESS) capsule 2 mg, 2 mg, Oral, QHS, Lisa A Leftwich-Kirby, CNM, 2 mg at 11/06/14 2146 .  prenatal multivitamin tablet 1 tablet, 1 tablet, Oral, Q1200, Kathreen CosierBernard A Marshall, MD, 1 tablet at 11/06/14 1212 .  promethazine (PHENERGAN) injection 12.5 mg, 12.5 mg, Intravenous, Q6H  PRN, Hurshel Party, CNM, 12.5 mg at 11/04/14 0202 .  promethazine (PHENERGAN) tablet 25 mg, 25 mg, Oral, Q6H PRN, Kathreen Cosier, MD, 25 mg at 11/04/14 1821 .  QUEtiapine (SEROQUEL) tablet 100 mg, 100 mg, Oral, QHS, Lisa A Leftwich-Kirby, CNM, 100 mg at 11/06/14 2146 .  simethicone (MYLICON) chewable tablet 80 mg, 80 mg, Oral, Q6H PRN, Kathreen Cosier, MD, 80 mg at 11/04/14 1804 .  zolpidem (AMBIEN) tablet 5 mg, 5 mg, Oral, QHS PRN, Wilmer Floor Leftwich-Kirby, CNM, 5 mg at 11/06/14 2249  Diet: routine diet  Activity: Advance as tolerated. Pelvic rest for 6 weeks.   Outpatient follow up:1 week at [facility]   11/07/2014 Roe Coombs, CNM

## 2014-11-09 ENCOUNTER — Ambulatory Visit (INDEPENDENT_AMBULATORY_CARE_PROVIDER_SITE_OTHER): Payer: Medicaid Other

## 2014-11-09 ENCOUNTER — Encounter: Payer: Self-pay | Admitting: Certified Nurse Midwife

## 2014-11-09 ENCOUNTER — Other Ambulatory Visit: Payer: Self-pay | Admitting: Certified Nurse Midwife

## 2014-11-09 ENCOUNTER — Ambulatory Visit (INDEPENDENT_AMBULATORY_CARE_PROVIDER_SITE_OTHER): Payer: Medicaid Other | Admitting: Certified Nurse Midwife

## 2014-11-09 VITALS — BP 126/85 | HR 67 | Temp 98.0°F | Wt 256.0 lb

## 2014-11-09 DIAGNOSIS — Z3490 Encounter for supervision of normal pregnancy, unspecified, unspecified trimester: Secondary | ICD-10-CM | POA: Diagnosis not present

## 2014-11-09 DIAGNOSIS — Z3480 Encounter for supervision of other normal pregnancy, unspecified trimester: Secondary | ICD-10-CM

## 2014-11-09 DIAGNOSIS — O211 Hyperemesis gravidarum with metabolic disturbance: Secondary | ICD-10-CM

## 2014-11-09 DIAGNOSIS — O3680X1 Pregnancy with inconclusive fetal viability, fetus 1: Secondary | ICD-10-CM

## 2014-11-09 MED ORDER — METRONIDAZOLE 0.75 % VA GEL: 1.0000 | Freq: Two times a day (BID) | VAGINAL | Status: DC

## 2014-11-09 MED ORDER — DOXYLAMINE-PYRIDOXINE 10-10 MG PO TBEC: DELAYED_RELEASE_TABLET | ORAL | Status: DC

## 2014-11-09 NOTE — Progress Notes (Signed)
Patient is still having hyperemesis- but today she is doing OK on her liquid diet

## 2014-11-09 NOTE — Progress Notes (Signed)
Subjective:    Jacqueline Clarke is being seen today for her first obstetrical visit.  This is not a planned pregnancy. She is at 6878w5d gestation. Her obstetrical history is significant for pregnancy induced hypertension and hyperemesis gravidum. Relationship with FOB: significant other, living together. Patient does intend to breast feed, had difficulty with latch with other pregnacies. Pregnancy history fully reviewed.  Vomited 15 times yesturday.  Today has vomited twice, is able to keep down [place], chicken broth.  Phenergan seems to help.  Reglan did not work. Works full time 3rd shift, been at this job for about 6-7 months.       The information documented in the HPI was reviewed and verified.  Menstrual History: OB History    Gravida Para Term Preterm AB TAB SAB Ectopic Multiple Living   6 3 3  0 2 0 2 0  3      Menarche age: [age]  Patient's last menstrual period was 09/09/2014.    Past Medical History  Diagnosis Date  . Asthma   . Scoliosis   . Blood type, Rh negative   . Anemia   . Chronic back pain     scolosis- on percocet  . Hyperemesis complicating pregnancy, antepartum 2013  . Headache(784.0)   . Cholelithiasis   . Pregnancy induced hypertension   . Hypothyroidism   . 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 Obesity     Past Surgical History  Procedure Laterality Date  . Adenoidectomy    . Nerve, tendon and artery repair Right 06/18/2014    Procedure: NERVE, TENDON AND ARTERY REPAIR;  Surgeon: Bradly BienenstockFred Ortmann, MD;  Location: [facility] OR;  Service: Orthopedics;  Laterality: Right;  . Cholecystectomy    . Adenoidectomy    . Dilation and curettage of uterus  2008  . Arm surgery       (Not in a hospital admission) No Known Allergies  Social History  Substance Use Topics  . Smoking status: Former Smoker -- 0.50 packs/day    Types: Cigarettes    Quit date: 11/17/2010  . Smokeless tobacco: Never Used  . Alcohol Use: No     Comment: Occassionally     Family History  Problem Relation Age of Onset  . Heart disease Maternal Grandfather   . Diabetes Maternal Grandfather   . Anesthesia problems Neg Hx   . Heart disease Father      Review of Systems Constitutional: negative for weight loss Gastrointestinal: + for nausea & vomiting Genitourinary:negative for genital lesions and vaginal discharge and dysuria Musculoskeletal:negative for back pain Behavioral/Psych: negative for abusive relationship, depression, illegal drug usage and tobacco use    Objective:    BP 126/85 mmHg  Pulse 67  Temp(Src) 98 F (36.7 C)  Wt 256 lb (116.121 kg)  LMP 09/09/2014 General Appearance:    Alert, cooperative, no distress, appears stated age  Head:    Normocephalic, without obvious abnormality, atraumatic  Eyes:    PERRL, conjunctiva/corneas clear, EOM's intact, fundi    benign, both eyes  Ears:    Normal TM's and external ear canals, both ears  Nose:   Nares normal, septum midline, mucosa normal, no drainage    or sinus tenderness  Throat:   Lips, mucosa, and tongue normal; teeth and gums normal  Neck:   Supple, symmetrical, trachea midline, no adenopathy;    thyroid:  no enlargement/tenderness/nodules; no carotid   bruit or JVD  Back:  Symmetric, no curvature, ROM normal, no CVA tenderness  Lungs:     Clear to auscultation bilaterally, respirations unlabored  Chest Wall:    No tenderness or deformity   Heart:    Regular rate and rhythm, S1 and S2 normal, no murmur, rub   or gallop  Breast Exam:    No tenderness, masses, or nipple abnormality  Abdomen:     Soft, non-tender, bowel sounds active all four quadrants,    no masses, no organomegaly  Genitalia:    Normal female without lesion, discharge or tenderness  Extremities:   Extremities normal, atraumatic, no cyanosis or edema  Pulses:   2+ and symmetric all extremities  Skin:   Skin color, texture, turgor normal, no rashes or lesions  Lymph nodes:   Cervical, supraclavicular, and  axillary nodes normal  Neurologic:   CNII-XII intact, normal strength, sensation and reflexes    throughout                                  Cervix:      Lab Review Urine pregnancy test Labs reviewed yes Radiologic studies reviewed yes Assessment:    Pregnancy at [redacted]w[redacted]d weeks   Hyperemesis gravidum  Plan:      Prenatal vitamins.  Counseling provided regarding continued use of seat belts, cessation of alcohol consumption, smoking or use of illicit drugs; infection precautions i.e., influenza/TDAP immunizations, toxoplasmosis,CMV, parvovirus, listeria and varicella; workplace safety, exercise during pregnancy; routine dental care, safe medications, sexual activity, hot tubs, saunas, pools, travel, caffeine use, fish and methlymercury, potential toxins, hair treatments, varicose veins Weight gain recommendations per IOM guidelines reviewed: underweight/BMI< 18.5--> gain 28 - 40 lbs; normal weight/BMI 18.5 - 24.9--> gain 25 - 35 lbs; overweight/BMI 25 - 29.9--> gain 15 - 25 lbs; obese/BMI >30->gain  11 - 20 lbs Problem list reviewed and updated. FIRST/CF mutation testing/NIPT/QUAD SCREEN/fragile X/Ashkenazi Jewish population testing/Spinal muscular atrophy discussed: requested. Role of ultrasound in pregnancy discussed; fetal survey: requested. Amniocentesis discussed: not indicated. VBAC calculator score: VBAC consent form provided Meds ordered this encounter  Medications  . Doxylamine-Pyridoxine (DICLEGIS) 10-10 MG TBEC    Sig: Take 1 tablet with breakfast and lunch.  Take 2 tablets at bedtime.    Dispense:  100 tablet    Refill:  4  . metroNIDAZOLE (METROGEL VAGINAL) 0.75 % vaginal gel    Sig: Place 1 Applicatorful vaginally 2 (two) times daily.    Dispense:  70 g    Refill:  0   Orders Placed This Encounter  Procedures  . Hemoglobinopathy evaluation  . Varicella zoster antibody, IgG  . Vit D  25 hydroxy (rtn osteoporosis monitoring)  . TSH  . Obstetric panel    Follow up  in 2 weeks. 50% of 30 min visit spent on counseling and coordination of care.

## 2014-11-10 ENCOUNTER — Other Ambulatory Visit: Payer: Self-pay | Admitting: Certified Nurse Midwife

## 2014-11-10 LAB — OBSTETRIC PANEL
ANTIBODY SCREEN: NEGATIVE
BASOS PCT: 1 % (ref 0–1)
Basophils Absolute: 0.1 10*3/uL (ref 0.0–0.1)
EOS PCT: 1 % (ref 0–5)
Eosinophils Absolute: 0.1 10*3/uL (ref 0.0–0.7)
HCT: 37.4 % (ref 36.0–46.0)
Hemoglobin: 11.7 g/dL — ABNORMAL LOW (ref 12.0–15.0)
Hepatitis B Surface Ag: NEGATIVE
LYMPHS PCT: 28 % (ref 12–46)
Lymphs Abs: 2.2 10*3/uL (ref 0.7–4.0)
MCH: 25.3 pg — ABNORMAL LOW (ref 26.0–34.0)
MCHC: 31.3 g/dL (ref 30.0–36.0)
MCV: 80.8 fL (ref 78.0–100.0)
MONO ABS: 0.9 10*3/uL (ref 0.1–1.0)
MPV: 10.5 fL (ref 8.6–12.4)
Monocytes Relative: 11 % (ref 3–12)
Neutro Abs: 4.7 10*3/uL (ref 1.7–7.7)
Neutrophils Relative %: 59 % (ref 43–77)
PLATELETS: 298 10*3/uL (ref 150–400)
RBC: 4.63 MIL/uL (ref 3.87–5.11)
RDW: 16.4 % — ABNORMAL HIGH (ref 11.5–15.5)
RH TYPE: NEGATIVE
Rubella: 1.03 Index — ABNORMAL HIGH (ref <0.90)
WBC: 7.9 10*3/uL (ref 4.0–10.5)

## 2014-11-10 LAB — TSH: TSH: 0.185 u[IU]/mL — AB (ref 0.350–4.500)

## 2014-11-10 LAB — PAP IG W/ RFLX HPV ASCU

## 2014-11-10 LAB — VARICELLA ZOSTER ANTIBODY, IGG: VARICELLA IGG: 1032 {index} — AB (ref <135.00)

## 2014-11-10 LAB — VITAMIN D 25 HYDROXY (VIT D DEFICIENCY, FRACTURES): Vit D, 25-Hydroxy: 23 ng/mL — ABNORMAL LOW (ref 30–100)

## 2014-11-13 LAB — HEMOGLOBINOPATHY EVALUATION
HEMOGLOBIN OTHER: 0 %
Hgb A2 Quant: 2.8 % (ref 2.2–3.2)
Hgb A: 96.7 % — ABNORMAL LOW (ref 96.8–97.8)
Hgb F Quant: 0.5 % (ref 0.0–2.0)
Hgb S Quant: 0 %

## 2014-11-15 LAB — THYROID PROFILE - CHCC
FREE THYROXINE INDEX: 2.8 (ref 1.4–3.8)
T3 Uptake: 27 % (ref 22–35)
T4 TOTAL: 10.5 ug/dL (ref 4.5–12.0)

## 2014-11-23 ENCOUNTER — Encounter: Payer: No Typology Code available for payment source | Admitting: Certified Nurse Midwife

## 2014-11-29 ENCOUNTER — Ambulatory Visit (INDEPENDENT_AMBULATORY_CARE_PROVIDER_SITE_OTHER): Payer: Medicaid Other | Admitting: Certified Nurse Midwife

## 2014-11-29 ENCOUNTER — Encounter: Payer: Medicaid Other | Admitting: Certified Nurse Midwife

## 2014-11-29 VITALS — BP 106/70 | HR 76 | Temp 98.6°F | Wt 259.0 lb

## 2014-11-29 DIAGNOSIS — Z331 Pregnant state, incidental: Secondary | ICD-10-CM

## 2014-11-29 DIAGNOSIS — Z3481 Encounter for supervision of other normal pregnancy, first trimester: Secondary | ICD-10-CM

## 2014-11-29 DIAGNOSIS — Z1389 Encounter for screening for other disorder: Secondary | ICD-10-CM

## 2014-11-29 NOTE — Progress Notes (Signed)
  Subjective:    Earleen Reaperrachelle L Newport is a 29 y.o. female being seen today for her obstetrical visit. She is at 7660w6d gestation. Patient reports: nausea, no bleeding, no contractions, no cramping, no leaking and vomiting about 4-5 times per day, improved from every 30 minutes.  Reports that diclegis helps a lot, is not using the phenergan or reglan at this point.  Prior auth completed for Diclegis.  Is able to keep down fluids and some soft foods.    Problem List Items Addressed This Visit    None    Visit Diagnoses    Supervision of other normal pregnancy, antepartum, first trimester    -  Primary    Relevant Orders    POCT urinalysis dipstick      Patient Active Problem List   Diagnosis Date Noted  . Hyperemesis gravidarum before end of [redacted] week gestation, dehydration 11/06/2014  . Malnutrition of moderate degree 11/03/2014  . Depressive disorder 12/14/2012  . Normal delivery 07/24/2011    Objective:     BP 106/70 mmHg  Pulse 76  Temp(Src) 98.6 F (37 C)  Wt 259 lb (117.482 kg)  LMP 09/09/2014 Uterine Size: Below symphysis pubis   FHR: 145  Assessment:    Pregnancy @ 4260w6d  weeks N&V in early pregnancy     Plan:    Problem list reviewed and updated. Labs reviewed.  Follow up in 4 weeks. FIRST/CF mutation testing/NIPT/QUAD SCREEN/fragile X/Ashkenazi Jewish population testing/Spinal muscular atrophy discussed: requested. Role of ultrasound in pregnancy discussed; fetal survey: requested. Amniocentesis discussed: not indicated. 50% of 15 minute visit spent on counseling and coordination of care.

## 2014-12-22 ENCOUNTER — Other Ambulatory Visit: Payer: Self-pay | Admitting: Certified Nurse Midwife

## 2014-12-27 ENCOUNTER — Encounter: Payer: Medicaid Other | Admitting: Certified Nurse Midwife

## 2014-12-27 ENCOUNTER — Ambulatory Visit (INDEPENDENT_AMBULATORY_CARE_PROVIDER_SITE_OTHER): Payer: Medicaid Other | Admitting: Certified Nurse Midwife

## 2014-12-27 ENCOUNTER — Telehealth: Payer: Self-pay

## 2014-12-27 VITALS — BP 104/72 | HR 73 | Temp 98.2°F | Wt 259.0 lb

## 2014-12-27 DIAGNOSIS — J069 Acute upper respiratory infection, unspecified: Secondary | ICD-10-CM

## 2014-12-27 DIAGNOSIS — Z3482 Encounter for supervision of other normal pregnancy, second trimester: Secondary | ICD-10-CM

## 2014-12-27 LAB — POCT URINALYSIS DIPSTICK
BILIRUBIN UA: NEGATIVE
Glucose, UA: NEGATIVE
Ketones, UA: NEGATIVE
NITRITE UA: NEGATIVE
PH UA: 6
Protein, UA: NEGATIVE
RBC UA: NEGATIVE
SPEC GRAV UA: 1.015
Urobilinogen, UA: NEGATIVE

## 2014-12-27 MED ORDER — PHENYLEPHRINE-APAP-GUAIFENESIN 5-325-200 MG PO TABS: 2.0000 | ORAL_TABLET | Freq: Four times a day (QID) | ORAL | Status: DC | PRN

## 2014-12-27 MED ORDER — FLUTICASONE PROPIONATE 50 MCG/ACT NA SUSP: 2.0000 | Freq: Every day | NASAL | Status: DC

## 2014-12-27 MED ORDER — DEXTROMETHORPHAN-GUAIFENESIN 10-100 MG/5ML PO SYRP: 5.0000 mL | ORAL_SOLUTION | Freq: Two times a day (BID) | ORAL | Status: DC

## 2014-12-27 NOTE — Telephone Encounter (Signed)
[facility] - they did sch but need it changed

## 2014-12-27 NOTE — Progress Notes (Signed)
  Subjective:    Jacqueline Clarke is a 29 y.o. female being seen today for her obstetrical visit. She is at 5889w6d gestation. Patient reports: no bleeding, no contractions, no cramping, no leaking and cold for 2 weeks, denies any fever or HA.  States N&V much improved with Diclegis.  States feels occasional fetal movement.  States she is working lots of hours.    Problem List Items Addressed This Visit    None    Visit Diagnoses    Supervision of other normal pregnancy, antepartum, second trimester    -  Primary    Relevant Orders    POCT urinalysis dipstick (Completed)    AFP, Quad Screen    US OB Comp + 14 Wk      Patient Active Problem List   Diagnosis Date Noted  . Hyperemesis gravidarum before end of [redacted] week gestation, dehydration 11/06/2014  . Malnutrition of moderate degree 11/03/2014  . Depressive disorder 12/14/2012  . Normal delivery 07/24/2011    Objective:     BP 104/72 mmHg  Pulse 73  Temp(Src) 98.2 F (36.8 C)  Wt 259 lb (117.482 kg)  LMP 09/09/2014 Uterine Size: Below umbilicus   FHR: 155  Assessment:    Pregnancy @ 4389w6d  weeks URI     Plan:    Problem list reviewed and updated. Labs reviewed.  Follow up in 4 weeks. FIRST/CF mutation testing/NIPT/QUAD SCREEN/fragile X/Ashkenazi Jewish population testing/Spinal muscular atrophy discussed: ordered. Role of ultrasound in pregnancy discussed; fetal survey: ordered. Amniocentesis discussed: not indicated. 50% of 15 minute visit spent on counseling and coordination of care.

## 2014-12-28 LAB — AFP, QUAD SCREEN
AFP: 23.9 ng/mL
Curr Gest Age: 15.4 wks.days
HCG, Total: 34.9 IU/mL
INH: 105.6 pg/mL
INTERPRETATION-AFP: NEGATIVE
MOM FOR AFP: 0.99
MOM FOR INH: 0.78
MoM for hCG: 1.05
OPEN SPINA BIFIDA: NEGATIVE
TRI 18 SCR RISK EST: NEGATIVE
Trisomy 18 (Edward) Syndrome Interp.: 1:29300 {titer}
uE3 Mom: 0.75
uE3 Value: 0.48 ng/mL

## 2015-01-02 ENCOUNTER — Other Ambulatory Visit: Payer: Self-pay | Admitting: Certified Nurse Midwife

## 2015-01-05 ENCOUNTER — Inpatient Hospital Stay (HOSPITAL_COMMUNITY)
Admission: AD | Admit: 2015-01-05 | Discharge: 2015-01-06 | Disposition: A | Discharge status: Home / Self Care | Payer: Medicaid Other | Source: Ambulatory Visit | Attending: Obstetrics | Admitting: Obstetrics

## 2015-01-05 ENCOUNTER — Encounter (HOSPITAL_COMMUNITY): Payer: Self-pay

## 2015-01-05 DIAGNOSIS — R109 Unspecified abdominal pain: Secondary | ICD-10-CM

## 2015-01-05 DIAGNOSIS — O23592 Infection of other part of genital tract in pregnancy, second trimester: Secondary | ICD-10-CM | POA: Insufficient documentation

## 2015-01-05 DIAGNOSIS — O219 Vomiting of pregnancy, unspecified: Secondary | ICD-10-CM

## 2015-01-05 DIAGNOSIS — R1032 Left lower quadrant pain: Secondary | ICD-10-CM | POA: Diagnosis present

## 2015-01-05 DIAGNOSIS — O26892 Other specified pregnancy related conditions, second trimester: Secondary | ICD-10-CM

## 2015-01-05 DIAGNOSIS — N760 Acute vaginitis: Secondary | ICD-10-CM | POA: Insufficient documentation

## 2015-01-05 DIAGNOSIS — R112 Nausea with vomiting, unspecified: Secondary | ICD-10-CM | POA: Insufficient documentation

## 2015-01-05 DIAGNOSIS — R51 Headache: Secondary | ICD-10-CM | POA: Insufficient documentation

## 2015-01-05 DIAGNOSIS — O9989 Other specified diseases and conditions complicating pregnancy, childbirth and the puerperium: Secondary | ICD-10-CM | POA: Diagnosis not present

## 2015-01-05 DIAGNOSIS — A499 Bacterial infection, unspecified: Secondary | ICD-10-CM | POA: Diagnosis not present

## 2015-01-05 DIAGNOSIS — Z3A16 16 weeks gestation of pregnancy: Secondary | ICD-10-CM | POA: Diagnosis not present

## 2015-01-05 DIAGNOSIS — O26899 Other specified pregnancy related conditions, unspecified trimester: Secondary | ICD-10-CM

## 2015-01-05 DIAGNOSIS — B9689 Other specified bacterial agents as the cause of diseases classified elsewhere: Secondary | ICD-10-CM

## 2015-01-05 HISTORY — DX: Major depressive disorder, single episode, unspecified: F32.9

## 2015-01-05 HISTORY — DX: Anxiety disorder, unspecified: F41.9

## 2015-01-05 HISTORY — DX: Depression, unspecified: F32.A

## 2015-01-05 NOTE — MAU Note (Signed)
Low abd pain since 2 pm, more on left side.  Vomited 4 times today, saw blood in vomit.  This morning, had little blood when wiped but none rest the day.

## 2015-01-06 ENCOUNTER — Inpatient Hospital Stay (HOSPITAL_COMMUNITY): Payer: Medicaid Other

## 2015-01-06 DIAGNOSIS — A499 Bacterial infection, unspecified: Secondary | ICD-10-CM

## 2015-01-06 DIAGNOSIS — R51 Headache: Secondary | ICD-10-CM

## 2015-01-06 DIAGNOSIS — R109 Unspecified abdominal pain: Secondary | ICD-10-CM

## 2015-01-06 DIAGNOSIS — O23592 Infection of other part of genital tract in pregnancy, second trimester: Secondary | ICD-10-CM | POA: Diagnosis not present

## 2015-01-06 DIAGNOSIS — N760 Acute vaginitis: Secondary | ICD-10-CM | POA: Diagnosis not present

## 2015-01-06 DIAGNOSIS — O9989 Other specified diseases and conditions complicating pregnancy, childbirth and the puerperium: Secondary | ICD-10-CM

## 2015-01-06 DIAGNOSIS — O219 Vomiting of pregnancy, unspecified: Secondary | ICD-10-CM

## 2015-01-06 DIAGNOSIS — O26892 Other specified pregnancy related conditions, second trimester: Secondary | ICD-10-CM

## 2015-01-06 LAB — WET PREP, GENITAL
SPERM: NONE SEEN
TRICH WET PREP: NONE SEEN
YEAST WET PREP: NONE SEEN

## 2015-01-06 LAB — URINALYSIS, ROUTINE W REFLEX MICROSCOPIC
Bilirubin Urine: NEGATIVE
GLUCOSE, UA: NEGATIVE mg/dL
Ketones, ur: 15 mg/dL — AB
Nitrite: NEGATIVE
PH: 6 (ref 5.0–8.0)
Protein, ur: NEGATIVE mg/dL
SPECIFIC GRAVITY, URINE: 1.025 (ref 1.005–1.030)

## 2015-01-06 LAB — COMPREHENSIVE METABOLIC PANEL
ALBUMIN: 3 g/dL — AB (ref 3.5–5.0)
ALK PHOS: 62 U/L (ref 38–126)
ALT: 9 U/L — AB (ref 14–54)
AST: 16 U/L (ref 15–41)
Anion gap: 8 (ref 5–15)
BUN: 12 mg/dL (ref 6–20)
CALCIUM: 9 mg/dL (ref 8.9–10.3)
CO2: 24 mmol/L (ref 22–32)
Chloride: 106 mmol/L (ref 101–111)
Creatinine, Ser: 0.64 mg/dL (ref 0.44–1.00)
GFR calc Af Amer: >60 mL/min (ref >60)
GFR calc non Af Amer: >60 mL/min (ref >60)
Glucose, Bld: 95 mg/dL (ref 65–99)
Potassium: 3.9 mmol/L (ref 3.5–5.1)
SODIUM: 138 mmol/L (ref 135–145)
Total Bilirubin: 0.5 mg/dL (ref 0.3–1.2)
Total Protein: 6.9 g/dL (ref 6.5–8.1)

## 2015-01-06 LAB — URINE MICROSCOPIC-ADD ON

## 2015-01-06 LAB — CBC
HCT: 31.2 % — ABNORMAL LOW (ref 36.0–46.0)
HEMOGLOBIN: 10.1 g/dL — AB (ref 12.0–15.0)
MCH: 26.3 pg (ref 26.0–34.0)
MCHC: 32.4 g/dL (ref 30.0–36.0)
MCV: 81.3 fL (ref 78.0–100.0)
Platelets: 227 10*3/uL (ref 150–400)
RBC: 3.84 MIL/uL — AB (ref 3.87–5.11)
RDW: 15.6 % — ABNORMAL HIGH (ref 11.5–15.5)
WBC: 11.9 10*3/uL — ABNORMAL HIGH (ref 4.0–10.5)

## 2015-01-06 MED ORDER — METOCLOPRAMIDE HCL 5 MG/ML IJ SOLN
10.0000 mg | Freq: Once | INTRAMUSCULAR | Status: AC
  Administered 2015-01-06: 10 mg via INTRAVENOUS
  Filled 2015-01-06: qty 2

## 2015-01-06 MED ORDER — DEXAMETHASONE SODIUM PHOSPHATE 10 MG/ML IJ SOLN
10.0000 mg | Freq: Once | INTRAMUSCULAR | Status: AC
  Administered 2015-01-06: 10 mg via INTRAVENOUS
  Filled 2015-01-06: qty 1

## 2015-01-06 MED ORDER — ACETAMINOPHEN 500 MG PO TABS
1000.0000 mg | ORAL_TABLET | Freq: Once | ORAL | Status: AC
  Administered 2015-01-06: 1000 mg via ORAL
  Filled 2015-01-06: qty 2

## 2015-01-06 MED ORDER — DIPHENHYDRAMINE HCL 50 MG/ML IJ SOLN
25.0000 mg | Freq: Once | INTRAMUSCULAR | Status: AC
  Administered 2015-01-06: 25 mg via INTRAVENOUS
  Filled 2015-01-06: qty 1

## 2015-01-06 MED ORDER — ONDANSETRON 8 MG PO TBDP: 8.0000 mg | ORAL_TABLET | Freq: Three times a day (TID) | ORAL | Status: DC | PRN

## 2015-01-06 MED ORDER — DICYCLOMINE HCL 10 MG PO CAPS
20.0000 mg | ORAL_CAPSULE | Freq: Once | ORAL | Status: AC
  Administered 2015-01-06: 20 mg via ORAL
  Filled 2015-01-06: qty 2

## 2015-01-06 MED ORDER — METRONIDAZOLE 500 MG PO TABS: 500.0000 mg | ORAL_TABLET | Freq: Two times a day (BID) | ORAL | Status: DC

## 2015-01-06 MED ORDER — SODIUM CHLORIDE 0.9 % IV SOLN
INTRAVENOUS | Status: DC
  Administered 2015-01-06: 01:00:00 via INTRAVENOUS

## 2015-01-06 NOTE — MAU Provider Note (Signed)
History     CSN: [id number]  Arrival date and time: 01/05/15 2309   First Provider Initiated Contact with Patient 01/06/15 0000       Chief Complaint  Patient presents with  . Abdominal Pain   HPI  Jacqueline Clarke is a 29 y.o. [id number] at 5481w2d who presents with abdominal pain, n/v,  & headache.  LLQ pain since this afternoon. Constant pain that she describes as cramp like & pressure. Rates as 10/10. Took tylenol with no relief. Denies vaginal bleeding, diarrhea, or constipation.  Nausea/vomiting throughout pregnancy. Was admitted x 1 week last month for hyperemesis with 20# weight loss. Reports improvement in symptoms once discharge but started vomiting again yesterday. Takes diclegis at home. Has vomited 4 times today. Last vomited a 10 pm.  Headache since 1 hour prior to arrival to [facility]U. Reports pounding headache. No treatment. Denies aggravating/alleviating factors. Rates as 10/10 on pain scale. Has had headaches like this before. No associated symptoms.      OB History    Gravida Para Term Preterm AB TAB SAB Ectopic Multiple Living   6 3 3  0 2 0 2 0  3      Past Medical History  Diagnosis Date  . Asthma   . Scoliosis   . Blood type, Rh negative   . Anemia   . Chronic back pain     scolosis- on percocet  . Hyperemesis complicating pregnancy, antepartum 2013  . Headache(784.0)   . Cholelithiasis   . Pregnancy induced hypertension   . 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 Obesity   . Depression   . Anxiety     Past Surgical History  Procedure Laterality Date  . Adenoidectomy    . Nerve, tendon and artery repair Right 06/18/2014    Procedure: NERVE, TENDON AND ARTERY REPAIR;  Surgeon: Bradly BienenstockFred Ortmann, MD;  Location: [facility] OR;  Service: Orthopedics;  Laterality: Right;  . Cholecystectomy    . Adenoidectomy    . Dilation and curettage of uterus  2008  . Arm surgery      Family History  Problem Relation Age of Onset  . Heart disease Maternal  Grandfather   . Diabetes Maternal Grandfather   . Anesthesia problems Neg Hx   . Heart disease Father     Social History  Substance Use Topics  . Smoking status: Former Smoker -- 0.50 packs/day    Types: Cigarettes    Quit date: 11/17/2010  . Smokeless tobacco: Never Used  . Alcohol Use: No     Comment: Occassionally    Allergies: No Known Allergies  Prescriptions prior to admission  Medication Sig Dispense Refill Last Dose  . acetaminophen (TYLENOL) 325 MG tablet Take 2 tablets (650 mg total) by mouth every 4 (four) hours as needed (for pain scale < 4  OR  temperature  >/=  100.5 F). 120 tablet 6 Past Week at Unknown time  . calcium carbonate (TUMS - DOSED IN MG ELEMENTAL CALCIUM) 500 MG chewable tablet Chew 2 tablets (400 mg of elemental calcium total) by mouth 3 (three) times daily with meals. 120 tablet 6 01/04/2015 at Unknown time  . clotrimazole (LOTRIMIN) 1 % cream Apply 1 application topically 2 (two) times daily.     Marland Kitchen. docusate sodium (COLACE) 100 MG capsule Take 1 capsule (100 mg total) by mouth daily. 120 capsule 6 Past Week at Unknown time  . Doxylamine-Pyridoxine (DICLEGIS) 10-10 MG TBEC Take 1 tablet with  breakfast and lunch.  Take 2 tablets at bedtime. 100 tablet 4 01/05/2015 at Unknown time  . Prenat-FeCbn-FeAspGl-FA-Omega (OB COMPLETE PETITE) 35-5-1-200 MG CAPS Take 1 capsule by mouth daily before breakfast. 90 capsule 3 01/05/2015 at Unknown time  . PRESCRIPTION MEDICATION LATUDA TWICE A DAY- DOESN'T KNOW MGs   01/04/2015 at Unknown time  . QUEtiapine (SEROQUEL) 100 MG tablet Take 1 tablet (100 mg total) by mouth at bedtime. 30 tablet 6 01/04/2015 at Unknown time  . Dextromethorphan-Guaifenesin (TUSSIN COUGH DM) 10-100 MG/5ML liquid Take 5 mLs by mouth every 12 (twelve) hours. 236 mL 0   . famotidine (PEPCID) 20 MG tablet Take 1 tablet (20 mg total) by mouth 2 (two) times daily. (Patient not taking: Reported on 12/27/2014) 60 tablet 9 Not Taking  . fluticasone (FLONASE)  50 MCG/ACT nasal spray Place 2 sprays into both nostrils daily. 16 g 2   . gabapentin (NEURONTIN) 600 MG tablet Take 1 tablet (600 mg total) by mouth at bedtime. (Patient not taking: Reported on 11/29/2014) 60 tablet 6 Not Taking  . metoCLOPramide (REGLAN) 10 MG tablet Take 1 tablet (10 mg total) by mouth 3 (three) times daily before meals. (Patient not taking: Reported on 12/27/2014) 90 tablet 0 Not Taking  . Phenylephrine-APAP-Guaifenesin 5-325-200 MG TABS Take 2 tablets by mouth every 6 (six) hours as needed. 30 each 0   . prazosin (MINIPRESS) 2 MG capsule Take 2 mg by mouth at bedtime.  1 Taking    Review of Systems  Constitutional: Negative.   Cardiovascular: Negative.   Gastrointestinal: Positive for nausea, vomiting and abdominal pain. Negative for diarrhea and constipation.  Genitourinary: Negative for dysuria.       + vaginal discharge No vaginal bleeding  Neurological: Positive for headaches. Negative for dizziness and loss of consciousness.   Physical Exam   Blood pressure 113/72, pulse 91, temperature 98.5 F (36.9 C), temperature source Oral, resp. rate 16, last menstrual period 09/09/2014, SpO2 100 %.  Physical Exam  Nursing note and vitals reviewed. Constitutional: She is oriented to person, place, and time. She appears well-developed and well-nourished. No distress.  HENT:  Head: Normocephalic and atraumatic.  Eyes: Conjunctivae are normal. Right eye exhibits no discharge. Left eye exhibits no discharge. No scleral icterus.  Neck: Normal range of motion.  Cardiovascular: Normal rate, regular rhythm and normal heart sounds.   No murmur heard. Respiratory: Effort normal and breath sounds normal. No respiratory distress. She has no wheezes.  GI: Soft. Bowel sounds are normal. She exhibits no distension and no mass. There is tenderness. There is no rebound and no guarding.  Genitourinary: Vagina normal. Cervix exhibits discharge (moderate amount of yellow thin discharge with  odor). Cervix exhibits no motion tenderness and no friability. Right adnexum displays no tenderness. Left adnexum displays tenderness.  Cervix closed No blood on exam Adnexa difficult to assess d/t body habitus  Neurological: She is alert and oriented to person, place, and time.  Skin: Skin is warm and dry. She is not diaphoretic.  Psychiatric: She has a normal mood and affect. Her behavior is normal. Judgment and thought content normal.    MAU Course  Procedures Results for orders placed or performed during the hospital encounter of 01/05/15 (from the past 24 hour(s))  Urinalysis, Routine w reflex microscopic (not at [facility])     Status: Abnormal   Collection Time: 01/05/15 11:40 PM  Result Value Ref Range   Color, Urine YELLOW YELLOW   APPearance CLEAR CLEAR   Specific Gravity,  Urine 1.025 1.005 - 1.030   pH 6.0 5.0 - 8.0   Glucose, UA NEGATIVE NEGATIVE mg/dL   Hgb urine dipstick SMALL (A) NEGATIVE   Bilirubin Urine NEGATIVE NEGATIVE   Ketones, ur 15 (A) NEGATIVE mg/dL   Protein, ur NEGATIVE NEGATIVE mg/dL   Nitrite NEGATIVE NEGATIVE   Leukocytes, UA MODERATE (A) NEGATIVE  Urine microscopic-add on     Status: Abnormal   Collection Time: 01/05/15 11:40 PM  Result Value Ref Range   Squamous Epithelial / LPF 6-30 (A) NONE SEEN   WBC, UA 0-5 0 - 5 WBC/hpf   RBC / HPF 6-30 0 - 5 RBC/hpf   Bacteria, UA RARE (A) NONE SEEN   Urine-Other MUCOUS PRESENT   Wet prep, genital     Status: Abnormal   Collection Time: 01/06/15 12:20 AM  Result Value Ref Range   Yeast Wet Prep HPF POC NONE SEEN NONE SEEN   Trich, Wet Prep NONE SEEN NONE SEEN   Clue Cells Wet Prep HPF POC PRESENT (A) NONE SEEN   WBC, Wet Prep HPF POC MANY (A) NONE SEEN   Sperm NONE SEEN   CBC     Status: Abnormal   Collection Time: 01/06/15 12:45 AM  Result Value Ref Range   WBC 11.9 (H) 4.0 - 10.5 K/uL   RBC 3.84 (L) 3.87 - 5.11 MIL/uL   Hemoglobin 10.1 (L) 12.0 - 15.0 g/dL   HCT 09.8 (L) 11.9 - 14.7 %   MCV 81.3 78.0  - 100.0 fL   MCH 26.3 26.0 - 34.0 pg   MCHC 32.4 30.0 - 36.0 g/dL   RDW 82.9 (H) 56.2 - 13.0 %   Platelets 227 150 - 400 K/uL  Comprehensive metabolic panel     Status: Abnormal   Collection Time: 01/06/15 12:45 AM  Result Value Ref Range   Sodium 138 135 - 145 mmol/L   Potassium 3.9 3.5 - 5.1 mmol/L   Chloride 106 101 - 111 mmol/L   CO2 24 22 - 32 mmol/L   Glucose, Bld 95 65 - 99 mg/dL   BUN 12 6 - 20 mg/dL   Creatinine, Ser 8.65 0.44 - 1.00 mg/dL   Calcium 9.0 8.9 - 78.4 mg/dL   Total Protein 6.9 6.5 - 8.1 g/dL   Albumin 3.0 (L) 3.5 - 5.0 g/dL   AST 16 15 - 41 U/L   ALT 9 (L) 14 - 54 U/L   Alkaline Phosphatase 62 38 - 126 U/L   Total Bilirubin 0.5 0.3 - 1.2 mg/dL   GFR calc non Af Amer >60 >60 mL/min   GFR calc Af Amer >60 >60 mL/min   Anion gap 8 5 - 15    MDM FHT 160 by doppler Ovaries normal per ultrasound IV fluid bolus with normal saline & headache cocktail Pt not observed vomiting while in MAU Reports small improvement in pain Tylenol & bentyl given Patient reports improvement in symptoms  Assessment and Plan  A: 1. BV (bacterial vaginosis)   2. Abdominal pain in pregnancy   3. Headache in pregnancy, antepartum, second trimester   4. Nausea and vomiting during pregnancy prior to [redacted] weeks gestation    P: Discharge home Keep scheduled prenatal appts Discussed reasons to return to [facility]U Take tylenol prn pain Rx flagyl & zofran  [name], NP   01/06/2015, 12:00 AM

## 2015-01-06 NOTE — Discharge Instructions (Signed)
Abdominal Pain During Pregnancy °Abdominal pain is common in pregnancy. Most of the time, it does not cause harm. There are many causes of abdominal pain. Some causes are more serious than others. Some of the causes of abdominal pain in pregnancy are easily diagnosed. Occasionally, the diagnosis takes time to understand. Other times, the cause is not determined. Abdominal pain can be a sign that something is very wrong with the pregnancy, or the pain may have nothing to do with the pregnancy at all. For this reason, always tell your health care provider if you have any abdominal discomfort. °HOME CARE INSTRUCTIONS  °Monitor your abdominal pain for any changes. The following actions may help to alleviate any discomfort you are experiencing: °· Do not have sexual intercourse or put anything in your vagina until your symptoms go away completely. °· Get plenty of rest until your pain improves. °· Drink clear fluids if you feel nauseous. Avoid solid food as long as you are uncomfortable or nauseous. °· Only take over-the-counter or prescription medicine as directed by your health care provider. °· Keep all follow-up appointments with your health care provider. °SEEK IMMEDIATE MEDICAL CARE IF: °· You are bleeding, leaking fluid, or passing tissue from the vagina. °· You have increasing pain or cramping. °· You have persistent vomiting. °· You have painful or bloody urination. °· You have a fever. °· You notice a decrease in your baby's movements. °· You have extreme weakness or feel faint. °· You have shortness of breath, with or without abdominal pain. °· You develop a severe headache with abdominal pain. °· You have abnormal vaginal discharge with abdominal pain. °· You have persistent diarrhea. °· You have abdominal pain that continues even after rest, or gets worse. °MAKE SURE YOU:  °· Understand these instructions. °· Will watch your condition. °· Will get help right away if you are not doing well or get worse. °    °This information is not intended to replace advice given to you by your health care provider. Make sure you discuss any questions you have with your health care provider. °  °Document Released: 01/06/2005 Document Revised: 10/27/2012 Document Reviewed: 08/05/2012 °Elsevier Interactive Patient Education ©2016 Elsevier Inc. °General Headache Without Cause °A headache is pain or discomfort felt around the head or neck area. The specific cause of a headache may not be found. There are many causes and types of headaches. A few common ones are: °· Tension headaches. °· Migraine headaches. °· Cluster headaches. °· Chronic daily headaches. °HOME CARE INSTRUCTIONS  °Watch your condition for any changes. Take these steps to help with your condition: °Managing Pain °· Take over-the-counter and prescription medicines only as told by your health care provider. °· Lie down in a dark, quiet room when you have a headache. °· If directed, apply ice to the head and neck area: °¨ Put ice in a plastic bag. °¨ Place a towel between your skin and the bag. °¨ Leave the ice on for 20 minutes, 2-3 times per day. °· Use a heating pad or hot shower to apply heat to the head and neck area as told by your health care provider. °· Keep lights dim if bright lights bother you or make your headaches worse. °Eating and Drinking °· Eat meals on a regular schedule. °· Limit alcohol use. °· Decrease the amount of caffeine you drink, or stop drinking caffeine. °General Instructions °· Keep all follow-up visits as told by your health care provider. This is important. °·   Keep a headache journal to help find out what may trigger your headaches. For example, write down:  What you eat and drink.  How much sleep you get.  Any change to your diet or medicines.  Try massage or other relaxation techniques.  Limit stress.  Sit up straight, and do not tense your muscles.  Do not use tobacco products, including cigarettes, chewing tobacco, or  e-cigarettes. If you need help quitting, ask your health care provider.  Exercise regularly as told by your health care provider.  Sleep on a regular schedule. Get 7-9 hours of sleep, or the amount recommended by your health care provider. SEEK MEDICAL CARE IF:   Your symptoms are not helped by medicine.  You have a headache that is different from the usual headache.  You have nausea or you vomit.  You have a fever. SEEK IMMEDIATE MEDICAL CARE IF:   Your headache becomes severe.  You have repeated vomiting.  You have a stiff neck.  You have a loss of vision.  You have problems with speech.  You have pain in the eye or ear.  You have muscular weakness or loss of muscle control.  You lose your balance or have trouble walking.  You feel faint or pass out.  You have confusion.   This information is not intended to replace advice given to you by your health care provider. Make sure you discuss any questions you have with your health care provider.   Document Released: 01/06/2005 Document Revised: 09/27/2014 Document Reviewed: 05/01/2014 Elsevier Interactive Patient Education 2016 Elsevier Inc. Bacterial Vaginosis Bacterial vaginosis is a vaginal infection that occurs when the normal balance of bacteria in the vagina is disrupted. It results from an overgrowth of certain bacteria. This is the most common vaginal infection in women of childbearing age. Treatment is important to prevent complications, especially in pregnant women, as it can cause a premature delivery. CAUSES  Bacterial vaginosis is caused by an increase in harmful bacteria that are normally present in smaller amounts in the vagina. Several different kinds of bacteria can cause bacterial vaginosis. However, the reason that the condition develops is not fully understood. RISK FACTORS Certain activities or behaviors can put you at an increased risk of developing bacterial vaginosis, including:  Having a new sex  partner or multiple sex partners.  Douching.  Using an intrauterine device (IUD) for contraception. Women do not get bacterial vaginosis from toilet seats, bedding, swimming pools, or contact with objects around them. SIGNS AND SYMPTOMS  Some women with bacterial vaginosis have no signs or symptoms. Common symptoms include:  Grey vaginal discharge.  A fishlike odor with discharge, especially after sexual intercourse.  Itching or burning of the vagina and vulva.  Burning or pain with urination. DIAGNOSIS  Your health care provider will take a medical history and examine the vagina for signs of bacterial vaginosis. A sample of vaginal fluid may be taken. Your health care provider will look at this sample under a microscope to check for bacteria and abnormal cells. A vaginal pH test may also be done.  TREATMENT  Bacterial vaginosis may be treated with antibiotic medicines. These may be given in the form of a pill or a vaginal cream. A second round of antibiotics may be prescribed if the condition comes back after treatment. Because bacterial vaginosis increases your risk for sexually transmitted diseases, getting treated can help reduce your risk for chlamydia, gonorrhea, HIV, and herpes. HOME CARE INSTRUCTIONS   Only take over-the-counter or  prescription medicines as directed by your health care provider.  If antibiotic medicine was prescribed, take it as directed. Make sure you finish it even if you start to feel better.  Tell all sexual partners that you have a vaginal infection. They should see their health care provider and be treated if they have problems, such as a mild rash or itching.  During treatment, it is important that you follow these instructions:  Avoid sexual activity or use condoms correctly.  Do not douche.  Avoid alcohol as directed by your health care provider.  Avoid breastfeeding as directed by your health care provider. SEEK MEDICAL CARE IF:   Your  symptoms are not improving after 3 days of treatment.  You have increased discharge or pain.  You have a fever. MAKE SURE YOU:   Understand these instructions.  Will watch your condition.  Will get help right away if you are not doing well or get worse. FOR MORE INFORMATION  Centers for Disease Control and Prevention, Division of STD Prevention: SolutionApps.co.zawww.cdc.gov/std American Sexual Health Association (ASHA): www.ashastd.org    This information is not intended to replace advice given to you by your health care provider. Make sure you discuss any questions you have with your health care provider.   Document Released: 01/06/2005 Document Revised: 01/27/2014 Document Reviewed: 08/18/2012 Elsevier Interactive Patient Education [employer]2016 Elsevier Inc.

## 2015-01-08 LAB — GC/CHLAMYDIA PROBE AMP (~~LOC~~) NOT AT ARMC
Chlamydia: NEGATIVE
Neisseria Gonorrhea: NEGATIVE

## 2015-01-17 ENCOUNTER — Telehealth: Payer: Self-pay | Admitting: *Deleted

## 2015-01-17 NOTE — Telephone Encounter (Signed)
Call taken by Steward DroneBrenda- Migraine for 3 days, ankles swelling, can't sleep 2:13 Attempted to call patient back- left message on VM- per Dr Clearance CootsHarper she needs to go to [facility]U for evaluation

## 2015-01-21 NOTE — L&D Delivery Note (Signed)
Delivery Note This is a 30 year old G 6 P3 who was admitted for Early latent labor. and SROM. She progressed normally with pitocin and epidural to the second stage of labor.  She pushed for 5 min.  At 5:08 AM she delivered a viable infant female, cephalic, over an intact perineum.  A nuchal cord   was not identified. Infant placed on maternal abdomen.  Delayed cord clamping was performed for 20-30 minutes.  Placenta delivered.  Cord double clamped and cut.  Apgar scores were 8 and 8. The placenta delivered spontaneously, shultz, with a 3 vessel cord.  Inspection revealed none. The uterus was firm bleeding stable.  EBL was 200.    Placenta and umbilical artery blood gas were not sent.  There were no complications during the procedure.  Mom and baby skin to skin following delivery. Left in stable condition.  a viable female was delivered via Vaginal, Spontaneous Delivery (Presentation: Right Occiput Anterior).  APGAR: 8, 8; weight  .   Placenta status: Intact, Spontaneous.  Cord: 3 vessels with the following complications: .  Cord pH: N/A  Anesthesia: Epidural  Episiotomy: None Lacerations: None Suture Repair: none Est. Blood Loss (mL): 200  Mom to postpartum.  Baby to Couplet care / Skin to Skin.  Roe Coombsachelle A Denney, CNM 06/22/2015, 5:48 AM

## 2015-01-24 ENCOUNTER — Ambulatory Visit (INDEPENDENT_AMBULATORY_CARE_PROVIDER_SITE_OTHER): Payer: Medicaid Other | Admitting: Certified Nurse Midwife

## 2015-01-24 ENCOUNTER — Ambulatory Visit (HOSPITAL_COMMUNITY)
Admission: RE | Admit: 2015-01-24 | Discharge: 2015-01-24 | Disposition: A | Discharge status: Home / Self Care | Payer: Medicaid Other | Source: Ambulatory Visit | Attending: Certified Nurse Midwife | Admitting: Certified Nurse Midwife

## 2015-01-24 VITALS — BP 96/69 | HR 79 | Temp 98.6°F | Wt 267.0 lb

## 2015-01-24 DIAGNOSIS — G4700 Insomnia, unspecified: Secondary | ICD-10-CM

## 2015-01-24 DIAGNOSIS — M545 Low back pain, unspecified: Secondary | ICD-10-CM

## 2015-01-24 DIAGNOSIS — R51 Headache: Secondary | ICD-10-CM

## 2015-01-24 DIAGNOSIS — Z3482 Encounter for supervision of other normal pregnancy, second trimester: Secondary | ICD-10-CM | POA: Insufficient documentation

## 2015-01-24 DIAGNOSIS — O26892 Other specified pregnancy related conditions, second trimester: Secondary | ICD-10-CM

## 2015-01-24 LAB — POCT URINALYSIS DIPSTICK
Bilirubin, UA: NEGATIVE
Blood, UA: NEGATIVE
Glucose, UA: NEGATIVE
KETONES UA: NEGATIVE
NITRITE UA: NEGATIVE
PH UA: 6
PROTEIN UA: NEGATIVE
Spec Grav, UA: 1.02
Urobilinogen, UA: NEGATIVE

## 2015-01-24 MED ORDER — DIPHENHYDRAMINE HCL (SLEEP) 50 MG PO CAPS: 1.0000 | ORAL_CAPSULE | Freq: Every day | ORAL | Status: DC

## 2015-01-24 MED ORDER — BUTALBITAL-APAP-CAFFEINE 50-325-40 MG PO TABS: 1.0000 | ORAL_TABLET | Freq: Four times a day (QID) | ORAL | Status: DC | PRN

## 2015-01-24 MED ORDER — CYCLOBENZAPRINE HCL 10 MG PO TABS: 10.0000 mg | ORAL_TABLET | Freq: Three times a day (TID) | ORAL | Status: DC | PRN

## 2015-01-24 MED ORDER — DIPHENHYDRAMINE HCL 25 MG PO TABS: 50.0000 mg | ORAL_TABLET | Freq: Every day | ORAL | Status: DC

## 2015-01-24 NOTE — Progress Notes (Signed)
  Subjective:    Jacqueline Clarke is a 30 y.o. female being seen today for her obstetrical visit. She is at 433w6d gestation. Patient reports: backache, fatigue, headache, nausea, no bleeding, no contractions, no cramping, no leaking and lumbar back pain, has a hx of scoliosis and back problems, states that sometimes at night she has resless leg syndrome and cannot sleep.  States that she also has insomnia, sleep hygiene discussed.  Is having daily HA that tylenol is not helping, discussed migraine management.  Problem List Items Addressed This Visit    None    Visit Diagnoses    Encounter for supervision of other normal pregnancy in second trimester    -  Primary    Relevant Orders    POCT urinalysis dipstick (Completed)    Headache in pregnancy, antepartum, second trimester        Relevant Medications    cyclobenzaprine (FLEXERIL) 10 MG tablet    butalbital-acetaminophen-caffeine (FIORICET) 50-325-40 MG tablet    Bilateral low back pain without sciatica        Relevant Medications    cyclobenzaprine (FLEXERIL) 10 MG tablet    butalbital-acetaminophen-caffeine (FIORICET) 50-325-40 MG tablet    Insomnia        Relevant Medications    diphenhydrAMINE (BENADRYL) 25 MG tablet    DiphenhydrAMINE HCl, Sleep, 50 MG CAPS      Patient Active Problem List   Diagnosis Date Noted  . Hyperemesis gravidarum before end of [redacted] week gestation, dehydration 11/06/2014  . Malnutrition of moderate degree 11/03/2014  . Depressive disorder 12/14/2012  . Normal delivery 07/24/2011    Objective:     BP 96/69 mmHg  Pulse 79  Temp(Src) 98.6 F (37 C)  Wt 267 lb (121.11 kg)  LMP 09/09/2014 Uterine Size: Below umbilicus   FHR: 155 by doppler  Assessment:    Pregnancy @ 373w6d  weeks Lumbar back pain ?RLS Daily HA in pregnancy, hx of migraines    Insomnia  Plan:    Problem list reviewed and updated. Labs reviewed.  Follow up in 4 weeks. FIRST/CF mutation testing/NIPT/QUAD  SCREEN/fragile X/Ashkenazi Jewish population testing/Spinal muscular atrophy discussed: results reviewed. Role of ultrasound in pregnancy discussed; fetal survey: had today at [facility]. Amniocentesis discussed: not indicated. 50% of 15 minute visit spent on counseling and coordination of care.

## 2015-01-24 NOTE — Progress Notes (Signed)
Pt is having sever back pain making it hard to sleep, states that she has been having Migraines as well.

## 2015-01-25 ENCOUNTER — Other Ambulatory Visit: Payer: Self-pay | Admitting: Certified Nurse Midwife

## 2015-01-30 ENCOUNTER — Other Ambulatory Visit: Payer: Self-pay | Admitting: Certified Nurse Midwife

## 2015-01-31 ENCOUNTER — Encounter (HOSPITAL_COMMUNITY): Payer: Self-pay

## 2015-01-31 ENCOUNTER — Inpatient Hospital Stay (HOSPITAL_COMMUNITY)
Admission: AD | Admit: 2015-01-31 | Discharge: 2015-01-31 | Disposition: A | Discharge status: Home / Self Care | Payer: Medicaid Other | Source: Ambulatory Visit | Attending: Obstetrics | Admitting: Obstetrics

## 2015-01-31 DIAGNOSIS — M545 Low back pain: Secondary | ICD-10-CM | POA: Diagnosis not present

## 2015-01-31 DIAGNOSIS — O26892 Other specified pregnancy related conditions, second trimester: Secondary | ICD-10-CM | POA: Diagnosis not present

## 2015-01-31 DIAGNOSIS — R51 Headache: Secondary | ICD-10-CM | POA: Diagnosis not present

## 2015-01-31 DIAGNOSIS — Z87891 Personal history of nicotine dependence: Secondary | ICD-10-CM | POA: Diagnosis not present

## 2015-01-31 DIAGNOSIS — O9A212 Injury, poisoning and certain other consequences of external causes complicating pregnancy, second trimester: Secondary | ICD-10-CM

## 2015-01-31 DIAGNOSIS — W009XXA Unspecified fall due to ice and snow, initial encounter: Secondary | ICD-10-CM

## 2015-01-31 DIAGNOSIS — Z3A19 19 weeks gestation of pregnancy: Secondary | ICD-10-CM

## 2015-01-31 DIAGNOSIS — W000XXA Fall on same level due to ice and snow, initial encounter: Secondary | ICD-10-CM | POA: Diagnosis not present

## 2015-01-31 DIAGNOSIS — O9989 Other specified diseases and conditions complicating pregnancy, childbirth and the puerperium: Secondary | ICD-10-CM

## 2015-01-31 DIAGNOSIS — R1030 Lower abdominal pain, unspecified: Secondary | ICD-10-CM | POA: Diagnosis present

## 2015-01-31 DIAGNOSIS — R519 Headache, unspecified: Secondary | ICD-10-CM

## 2015-01-31 DIAGNOSIS — G8929 Other chronic pain: Secondary | ICD-10-CM | POA: Diagnosis not present

## 2015-01-31 DIAGNOSIS — S3992XA Unspecified injury of lower back, initial encounter: Secondary | ICD-10-CM | POA: Diagnosis not present

## 2015-01-31 LAB — URINALYSIS, ROUTINE W REFLEX MICROSCOPIC
Bilirubin Urine: NEGATIVE
Glucose, UA: NEGATIVE mg/dL
HGB URINE DIPSTICK: NEGATIVE
Ketones, ur: NEGATIVE mg/dL
Nitrite: NEGATIVE
PROTEIN: NEGATIVE mg/dL
SPECIFIC GRAVITY, URINE: 1.025 (ref 1.005–1.030)
pH: 6 (ref 5.0–8.0)

## 2015-01-31 LAB — URINE MICROSCOPIC-ADD ON: BACTERIA UA: NONE SEEN

## 2015-01-31 MED ORDER — METOCLOPRAMIDE HCL 5 MG/ML IJ SOLN
10.0000 mg | Freq: Once | INTRAMUSCULAR | Status: AC
  Administered 2015-01-31: 10 mg via INTRAVENOUS
  Filled 2015-01-31: qty 2

## 2015-01-31 MED ORDER — SODIUM CHLORIDE 0.9 % IV SOLN
INTRAVENOUS | Status: DC
  Administered 2015-01-31: 999 mL/h via INTRAVENOUS

## 2015-01-31 MED ORDER — DEXAMETHASONE SODIUM PHOSPHATE 10 MG/ML IJ SOLN
10.0000 mg | Freq: Once | INTRAMUSCULAR | Status: AC
  Administered 2015-01-31: 10 mg via INTRAVENOUS
  Filled 2015-01-31: qty 1

## 2015-01-31 NOTE — Discharge Instructions (Signed)
Analgesic Rebound Headaches An analgesic rebound headache is a headache that returns after pain medicine (analgesic) that was taken to treat the initial headache wears off. People who suffer from tension, migraine, or cluster headaches are at risk for developing rebound headaches. Any type of primary headache can return as a rebound headache if you regularly take analgesics more than three times a week. If the cycle of rebound headaches continues, they become chronic daily headaches.  CAUSES Analgesics frequently associated with this problem include common over-the-counter medicines like aspirin, ibuprofen, acetaminophen, sinus relief medicines, and other medicines that contain caffeine. Narcotic pain medicines are also a common cause of rebound headaches.  SIGNS AND SYMPTOMS The symptoms of rebound headaches are the same as the symptoms of your initial headache. Symptoms of specific types of headaches include: Tension headache  Pressure around the head.  Dull, aching head pain.  Pain felt over the front and sides of the head.  Tenderness in the muscles of the head, neck and shoulders. Migraine Headache  Pulsing or throbbing pain on one or both sides of the head.  Severe pain that interferes with daily activities.  Pain that is worsened by physical activity.  Nausea, vomiting, or both.  Pain with exposure to bright light, loud noises, or strong smells.  General sensitivity to bright light, loud noises, or strong smells.  Visual changes.  Numbness of one or both arms. Cluster Headaches  Severe pain that begins in or around one eye or temple.  Redness in the eye on the same side as the pain.  Droopy or swollen eyelid.  One-sided head pain.  Nausea.  Runny nose.  Sweaty, pale facial skin.  Restlessness. DIAGNOSIS  Analgesic rebound headaches are diagnosed by reviewing your medical history. This includes the nature of your initial headaches, as well as the type of pain  medicines you have been using to treat your headaches and how often you take them. TREATMENT Discontinuing frequent use of the analgesic medicine will typically reduce the frequency of the rebound episodes. This may initially worsen your headaches but eventually the pain should become more manageable, less frequent, and less severe.  Seeing a headache specialists may helpful. He or she may be able to help you manage your headaches and to make sure there is not another cause of the headaches. Alternative methods of stress relief such as acupuncture, counseling, biofeedback, and massage may also be helpful. Talk with your health care provider about which alternative treatments might be good for you. HOME CARE INSTRUCTIONS Stopping the regular use of pain medicine can be difficult. Follow your health care provider's instructions carefully. Keep all of your appointments. Avoid triggers that are known to cause your primary headaches. SEEK MEDICAL CARE IF: You continue to experience headaches after following your health care provider's recommended treatments. SEEK IMMEDIATE MEDICAL CARE IF:  You develop new headache pain.  You develop headache pain that is different than what you have experienced in the past.  You develop numbness or tingling in your arms or legs.  You develop changes in your speech or vision. MAKE SURE YOU:  Understand these instructions.  Will watch your child's condition.  Will get help right away if your child is not doing well or gets worse.   This information is not intended to replace advice given to you by your health care provider. Make sure you discuss any questions you have with your health care provider.   Document Released: 03/29/2003 Document Revised: 01/27/2014 Document Reviewed: 07/22/2012 Elsevier  Interactive Patient Education 2016 [place] Injury Prevention Back injuries can be very painful. They can also be difficult to heal. After having one  back injury, you are more likely to injure your back again. It is important to learn how to avoid injuring or re-injuring your back. The following tips can help you to prevent a back injury. WHAT SHOULD I KNOW ABOUT PHYSICAL FITNESS?  Exercise for 30 minutes per day on most days of the week or as directed by your health care provider. Make sure to:  Do aerobic exercises, such as walking, jogging, biking, or swimming.  Do exercises that increase balance and strength, such as tai chi and yoga. These can decrease your risk of falling and injuring your back.  Do stretching exercises to help with flexibility.  Try to develop strong abdominal muscles. Your abdominal muscles provide a lot of the support that is needed by your back.  Maintain a healthy weight. This helps to decrease your risk of a back injury. WHAT SHOULD I KNOW ABOUT MY DIET?  Talk with your health care provider about your overall diet. Take supplements and vitamins only as directed by your health care provider.  Talk with your health care provider about how much calcium and vitamin D you need each day. These nutrients help to prevent weakening of the bones (osteoporosis). Osteoporosis can cause broken (fractured) bones, which lead to back pain.  Include good sources of calcium in your diet, such as dairy products, green leafy vegetables, and products that have had calcium added to them (fortified).  Include good sources of vitamin D in your diet, such as milk and foods that are fortified with vitamin D. WHAT SHOULD I KNOW ABOUT MY POSTURE?  Sit up straight and stand up straight. Avoid leaning forward when you sit or hunching over when you stand.  Choose chairs that have good low-back (lumbar) support.  If you work at a desk, sit close to it so you do not need to lean over. Keep your chin tucked in. Keep your neck drawn back, and keep your elbows bent at a right angle. Your arms should look like the letter "L."  Sit high and  close to the steering wheel when you drive. Add a lumbar support to your car seat, if needed.  Avoid sitting or standing in one position for very long. Take breaks to get up, stretch, and walk around at least one time every hour. Take breaks every hour if you are driving for long periods of time.  Sleep on your side with your knees slightly bent, or sleep on your back with a pillow under your knees. Do not lie on the front of your body to sleep. WHAT SHOULD I KNOW ABOUT LIFTING, TWISTING, AND REACHING? Lifting and Heavy Lifting  Avoid heavy lifting, especially repetitive heavy lifting. If you must do heavy lifting:  Stretch before lifting.  Work slowly.  Rest between lifts.  Use a tool such as a cart or a dolly to move objects if one is available.  Make several small trips instead of carrying one heavy load.  Ask for help when you need it, especially when moving big objects.  Follow these steps when lifting:  Stand with your feet shoulder-width apart.  Get as close to the object as you can. Do not try to pick up a heavy object that is far from your body.  Use handles or lifting straps if they are available.  Bend at your knees. Squat  down, but keep your heels off the floor.  Keep your shoulders pulled back, your chin tucked in, and your back straight.  Lift the object slowly while you tighten the muscles in your legs, abdomen, and buttocks. Keep the object as close to the center of your body as possible.  Follow these steps when putting down a heavy load:  Stand with your feet shoulder-width apart.  Lower the object slowly while you tighten the muscles in your legs, abdomen, and buttocks. Keep the object as close to the center of your body as possible.  Keep your shoulders pulled back, your chin tucked in, and your back straight.  Bend at your knees. Squat down, but keep your heels off the floor.  Use handles or lifting straps if they are available. Twisting and  Reaching  Avoid lifting heavy objects above your waist.  Do not twist at your waist while you are lifting or carrying a load. If you need to turn, move your feet.  Do not bend over without bending at your knees.  Avoid reaching over your head, across a table, or for an object on a high surface. WHAT ARE SOME OTHER TIPS?  Avoid wet floors and icy ground. Keep sidewalks clear of ice to prevent falls.  Do not sleep on a mattress that is too soft or too hard.  Keep items that are used frequently within easy reach.  Put heavier objects on shelves at waist level, and put lighter objects on lower or higher shelves.  Find ways to decrease your stress, such as exercise, massage, or relaxation techniques. Stress can build up in your muscles. Tense muscles are more vulnerable to injury.  Talk with your health care provider if you feel anxious or depressed. These conditions can make back pain worse.  Wear flat heel shoes with cushioned soles.  Avoid sudden movements.  Use both shoulder straps when carrying a backpack.  Do not use any tobacco products, including cigarettes, chewing tobacco, or electronic cigarettes. If you need help quitting, ask your health care provider.   This information is not intended to replace advice given to you by your health care provider. Make sure you discuss any questions you have with your health care provider.   Document Released: 02/14/2004 Document Revised: 05/23/2014 Document Reviewed: 01/10/2014 Elsevier Interactive Patient Education 2016 [place] Headache Without Cause A headache is pain or discomfort felt around the head or neck area. There are many causes and types of headaches. In some cases, the cause may not be found.  HOME CARE  Managing Pain  Take over-the-counter and prescription medicines only as told by your doctor.  Lie down in a dark, quiet room when you have a headache.  If directed, apply ice to the head and neck  area:  Put ice in a plastic bag.  Place a towel between your skin and the bag.  Leave the ice on for 20 minutes, 2-3 times per day.  Use a heating pad or hot shower to apply heat to the head and neck area as told by your doctor.  Keep lights dim if bright lights bother you or make your headaches worse. Eating and Drinking  Eat meals on a regular schedule.  Lessen how much alcohol you drink.  Lessen how much caffeine you drink, or stop drinking caffeine. General Instructions  Keep all follow-up visits as told by your doctor. This is important.  Keep a journal to find out if certain things bring on headaches. For  example, write down:  What you eat and drink.  How much sleep you get.  Any change to your diet or medicines.  Relax by getting a massage or doing other relaxing activities.  Lessen stress.  Sit up straight. Do not tighten (tense) your muscles.  Do not use tobacco products. This includes cigarettes, chewing tobacco, or e-cigarettes. If you need help quitting, ask your doctor.  Exercise regularly as told by your doctor.  Get enough sleep. This often means 7-9 hours of sleep. GET HELP IF:  Your symptoms are not helped by medicine.  You have a headache that feels different than the other headaches.  You feel sick to your stomach (nauseous) or you throw up (vomit).  You have a fever. GET HELP RIGHT AWAY IF:   Your headache becomes really bad.  You keep throwing up.  You have a stiff neck.  You have trouble seeing.  You have trouble speaking.  You have pain in the eye or ear.  Your muscles are weak or you lose muscle control.  You lose your balance or have trouble walking.  You feel like you will pass out (faint) or you pass out.  You have confusion.   This information is not intended to replace advice given to you by your health care provider. Make sure you discuss any questions you have with your health care provider.   Document Released:  10/16/2007 Document Revised: 09/27/2014 Document Reviewed: 05/01/2014 Elsevier Interactive Patient Education [employer].

## 2015-01-31 NOTE — MAU Note (Signed)
Pt reports she slipped on the ice yesterday , states her feet came out from under her and she landed on her back and then today she fell going down her steps and landed face down. Is now having pressure in her lower abd and pain in her lower back. Also reports a headache since last pm. Denies vaginal bleeding.

## 2015-01-31 NOTE — MAU Provider Note (Signed)
Chief Complaint:  No chief complaint on file.   First Provider Initiated Contact with Patient 01/31/15 2022     HPI   Jacqueline Clarke is a 30 y.o. [id number] at 12w6dwho presents to maternity admissions reporting lower abdominal pain and headache. Also has low back pain (chronic) which has worsened since two falls this week.  States leg gave out this morning and she fell out of bed but husband caught her.  Landed on her back and hit head yesterday, did not call EMS or MD.  Landed on stomach and face this AM, did not call EMS or MD.  Has had headache since yesterday, unresponsive to Tylenol, Fioricet and Flexeril. She reports good fetal movement, denies LOF, vaginal bleeding, vaginal itching/burning, urinary symptoms, h/a, dizziness, n/v, diarrhea, constipation or fever/chills.  She denies headache, visual changes.  RN Note: Pt reports she slipped on the ice yesterday , states her feet came out from under her and she landed on her back and then today she fell going down her steps and landed face down. Is now having pressure in her lower abd and pain in her lower back. Also reports a headache since last pm. Denies vaginal bleeding.           Past Medical History: Past Medical History  Diagnosis Date  . Asthma   . Scoliosis   . Blood type, Rh negative   . Anemia   . Chronic back pain     scolosis- on percocet  . Hyperemesis complicating pregnancy, antepartum 2013  . Headache(784.0)   . Cholelithiasis   . Pregnancy induced hypertension   . 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 Obesity   . Depression   . Anxiety     Past obstetric history: OB History  Gravida Para Term Preterm AB SAB TAB Ectopic Multiple Living  6 3 3  0 2 2 0 0  3    # Outcome Date GA Lbr Len/2nd Weight Sex Delivery Anes PTL Lv  6 Current           5 Term 07/22/11 [redacted]w[redacted]d 03:24 / 00:15 4.065 kg (8 lb 15.4 oz) F Vag-Spont EPI  Y     Comments: WNL  4 Term 2011 [redacted]w[redacted]d 04:00 3.118 kg (6 lb 14 oz) M  Vag-Spont EPI  Y  3 Term 2010 [redacted]w[redacted]d  3.345 kg (7 lb 6 oz) M Vag-Spont EPI  Y  2 SAB 2009          1 SAB 2006              Past Surgical History: Past Surgical History  Procedure Laterality Date  . Adenoidectomy    . Nerve, tendon and artery repair Right 06/18/2014    Procedure: NERVE, TENDON AND ARTERY REPAIR;  Surgeon: Bradly Bienenstock, MD;  Location: [facility] OR;  Service: Orthopedics;  Laterality: Right;  . Cholecystectomy    . Adenoidectomy    . Dilation and curettage of uterus  2008  . Arm surgery      Family History: Family History  Problem Relation Age of Onset  . Heart disease Maternal Grandfather   . Diabetes Maternal Grandfather   . Anesthesia problems Neg Hx   . Heart disease Father     Social History: Social History  Substance Use Topics  . Smoking status: Former Smoker -- 0.50 packs/day    Types: Cigarettes    Quit date: 11/17/2010  . Smokeless tobacco: Never Used  . Alcohol  Use: No     Comment: Occassionally    Allergies: No Known Allergies  Meds:  Prescriptions prior to admission  Medication Sig Dispense Refill Last Dose  . acetaminophen (TYLENOL) 325 MG tablet Take 2 tablets (650 mg total) by mouth every 4 (four) hours as needed (for pain scale < 4  OR  temperature  >/=  100.5 F). (Patient taking differently: Take 1,000 mg by mouth every 4 (four) hours as needed for mild pain. ) 120 tablet 6 01/31/2015 at 1700  . butalbital-acetaminophen-caffeine (FIORICET) 50-325-40 MG tablet Take 1-2 tablets by mouth every 6 (six) hours as needed for headache. 45 tablet 4 01/31/2015 at 1600  . calcium carbonate (TUMS - DOSED IN MG ELEMENTAL CALCIUM) 500 MG chewable tablet Chew 2 tablets (400 mg of elemental calcium total) by mouth 3 (three) times daily with meals. 120 tablet 6 01/31/2015 at Unknown time  . clotrimazole (LOTRIMIN) 1 % cream Apply 1 application topically 2 (two) times daily.   Past Month at Unknown time  . cyclobenzaprine (FLEXERIL) 10 MG tablet Take 1 tablet (10 mg  total) by mouth 3 (three) times daily as needed for muscle spasms. 30 tablet 0 01/31/2015 at 1600  . diphenhydrAMINE (BENADRYL) 25 MG tablet Take 2 tablets (50 mg total) by mouth at bedtime. 90 tablet 4 01/30/2015 at Unknown time  . docusate sodium (COLACE) 100 MG capsule Take 1 capsule (100 mg total) by mouth daily. 120 capsule 6 Past Week at Unknown time  . Doxylamine-Pyridoxine (DICLEGIS) 10-10 MG TBEC Take 1 tablet with breakfast and lunch.  Take 2 tablets at bedtime. 100 tablet 4 01/31/2015 at 1300  . famotidine (PEPCID) 20 MG tablet Take 1 tablet (20 mg total) by mouth 2 (two) times daily. 60 tablet 9 Past Week at Unknown time  . fluticasone (FLONASE) 50 MCG/ACT nasal spray Place 2 sprays into both nostrils daily. 16 g 2 Past Week at Unknown time  . gabapentin (NEURONTIN) 600 MG tablet Take 1 tablet (600 mg total) by mouth at bedtime. 60 tablet 6 01/30/2015 at Unknown time  . prazosin (MINIPRESS) 2 MG capsule Take 2 mg by mouth at bedtime. Reported on 01/24/2015  1 Past Week at Unknown time  . Prenat-FeCbn-FeAspGl-FA-Omega (OB COMPLETE PETITE) 35-5-1-200 MG CAPS Take 1 capsule by mouth daily before breakfast. 90 capsule 3 Past Week at Unknown time  . QUEtiapine (SEROQUEL) 100 MG tablet Take 1 tablet (100 mg total) by mouth at bedtime. 30 tablet 6 01/30/2015 at Unknown time  . Dextromethorphan-Guaifenesin (TUSSIN COUGH DM) 10-100 MG/5ML liquid Take 5 mLs by mouth every 12 (twelve) hours. (Patient not taking: Reported on 01/31/2015) 236 mL 0   . metoCLOPramide (REGLAN) 10 MG tablet Take 1 tablet (10 mg total) by mouth 3 (three) times daily before meals. (Patient not taking: Reported on 12/27/2014) 90 tablet 0 Not Taking  . metroNIDAZOLE (FLAGYL) 500 MG tablet Take 1 tablet (500 mg total) by mouth 2 (two) times daily. (Patient not taking: Reported on 01/31/2015) 14 tablet 0   . ondansetron (ZOFRAN ODT) 8 MG disintegrating tablet Take 1 tablet (8 mg total) by mouth every 8 (eight) hours as needed for nausea or  vomiting. (Patient not taking: Reported on 01/31/2015) 20 tablet 0 More than a month at Unknown time  . Phenylephrine-APAP-Guaifenesin 5-325-200 MG TABS Take 2 tablets by mouth every 6 (six) hours as needed. (Patient not taking: Reported on 01/31/2015) 30 each 0     I have reviewed patient's Past Medical Hx, Surgical Hx,  Family Hx, Social Hx, medications and allergies.   ROS:  Review of Systems  Constitutional: Negative for fever, chills and fatigue.  Eyes: Negative for visual disturbance.  Respiratory: Negative for cough, chest tightness and shortness of breath.   Cardiovascular: Negative for chest pain and leg swelling.  Gastrointestinal: Positive for abdominal pain. Negative for nausea, vomiting, diarrhea and constipation.  Genitourinary: Positive for pelvic pain. Negative for dysuria, vaginal bleeding and vaginal discharge.  Musculoskeletal: Positive for back pain. Negative for myalgias, joint swelling and neck pain.  Neurological: Positive for weakness ("all over"), numbness (momentary this am, none now) and headaches. Negative for dizziness, tremors, seizures, syncope, speech difficulty and light-headedness.   Physical Exam  Patient Vitals for the past 24 hrs:  BP Temp Temp src Pulse Resp SpO2 Height Weight  01/31/15 1943 122/73 mmHg 98.2 F (36.8 C) Oral 82 16 100 % 5\' 10"  (1.778 m) 122.018 kg (269 lb)   Constitutional: Well-developed, well-nourished female in no acute distress.  Cardiovascular: normal rate and rhythm Respiratory: normal effort, clear to auscultate bilaterally GI: Abd soft, non-tender, gravid appropriate for gestational age.   No rebound or guarding, but there is tenderness LLQ. MS: Extremities nontender, no edema, normal ROM   Lower back tender around L spine and sciatic joint, worse with movement. No acute neuro deficits. Moves easily.  No sensorimotor defects noted. Neurologic: Alert and oriented x 4. + headache. Oriented x 3.   No asymetry. GU: Neg  CVAT.  PELVIC EXAM: Cervix firm, closed, posterior, neg CMT, uterus nontender, Fundal Height consistent with dates, adnexa without tenderness, enlargement, or mass    FHT:  152 by Doppler  Labs: No results found for this or any previous visit (from the past 24 hour(s)). O/NEG/-- (10/20 1531)  Imaging:  [place] [facility] Ob [facility] + 14 Wk  01/24/2015  OBSTETRICAL ULTRASOUND: This exam was performed within a [facility]. The OB US report was generated in the [facility] system, and faxed to the ordering physician.  This report is available in the [facility] [employer] PACS. See the [facility] Obstetric US report via the Image Link.  [facility] [facility]  01/06/2015  OBSTETRICAL ULTRASOUND: This exam was performed within a [facility]. The OB US report was generated in the [facility] system, and faxed to the ordering physician.  This report is available in the [facility] [employer] PACS. See the [facility] Obstetric US report via the Image Link.   MAU Course/MDM:  Consult Dr Clearance CootsHarper with presentation, exam findings and test results.  He recommends headache cocktail and will refer to Ortho per office tomorrow.  Treatments in MAU included IV hydration and headache cocktail..  >> Headache much improved after treatment Pt stable at time of discharge.   Assessment: SIUP at 1863w6d  Fall x 2 Headache  Plan: Discharge home Preterm Labor precautions and fetal kick counts Follow up in Office for prenatal visits and recheck Fall prevention discussed Pt to have office refer her to Orthopedics for back injury    Medication List    ASK your doctor about these medications        acetaminophen 325 MG tablet  Commonly known as:  TYLENOL  Take 2 tablets (650 mg total) by mouth every 4 (four) hours as needed (for pain scale < 4  OR  temperature  >/=  100.5 F).     butalbital-acetaminophen-caffeine 50-325-40 MG tablet  Commonly known as:  FIORICET  Take 1-2 tablets by mouth every 6 (six) hours as needed for  headache.      calcium carbonate 500 MG chewable tablet  Commonly known as:  TUMS - dosed in mg elemental calcium  Chew 2 tablets (400 mg of elemental calcium total) by mouth 3 (three) times daily with meals.     clotrimazole 1 % cream  Commonly known as:  LOTRIMIN  Apply 1 application topically 2 (two) times daily.     cyclobenzaprine 10 MG tablet  Commonly known as:  FLEXERIL  Take 1 tablet (10 mg total) by mouth 3 (three) times daily as needed for muscle spasms.     Dextromethorphan-Guaifenesin 10-100 MG/5ML liquid  Commonly known as:  TUSSIN COUGH DM  Take 5 mLs by mouth every 12 (twelve) hours.     diphenhydrAMINE 25 MG tablet  Commonly known as:  BENADRYL  Take 2 tablets (50 mg total) by mouth at bedtime.     docusate sodium 100 MG capsule  Commonly known as:  COLACE  Take 1 capsule (100 mg total) by mouth daily.     Doxylamine-Pyridoxine 10-10 MG Tbec  Commonly known as:  DICLEGIS  Take 1 tablet with breakfast and lunch.  Take 2 tablets at bedtime.     famotidine 20 MG tablet  Commonly known as:  PEPCID  Take 1 tablet (20 mg total) by mouth 2 (two) times daily.     fluticasone 50 MCG/ACT nasal spray  Commonly known as:  FLONASE  Place 2 sprays into both nostrils daily.     gabapentin 600 MG tablet  Commonly known as:  NEURONTIN  Take 1 tablet (600 mg total) by mouth at bedtime.     metoCLOPramide 10 MG tablet  Commonly known as:  REGLAN  Take 1 tablet (10 mg total) by mouth 3 (three) times daily before meals.     metroNIDAZOLE 500 MG tablet  Commonly known as:  FLAGYL  Take 1 tablet (500 mg total) by mouth 2 (two) times daily.     OB COMPLETE PETITE 35-5-1-200 MG Caps  Take 1 capsule by mouth daily before breakfast.     ondansetron 8 MG disintegrating tablet  Commonly known as:  ZOFRAN ODT  Take 1 tablet (8 mg total) by mouth every 8 (eight) hours as needed for nausea or vomiting.     Phenylephrine-APAP-Guaifenesin 5-325-200 MG Tabs  Take 2 tablets by mouth every 6  (six) hours as needed.     prazosin 2 MG capsule  Commonly known as:  MINIPRESS  Take 2 mg by mouth at bedtime. Reported on 01/24/2015     QUEtiapine 100 MG tablet  Commonly known as:  SEROQUEL  Take 1 tablet (100 mg total) by mouth at bedtime.        Wynelle Bourgeois CNM, MSN Certified Nurse-Midwife 01/31/2015 8:23 PM

## 2015-02-11 ENCOUNTER — Encounter (HOSPITAL_COMMUNITY): Payer: Self-pay | Admitting: *Deleted

## 2015-02-11 ENCOUNTER — Inpatient Hospital Stay (HOSPITAL_COMMUNITY)
Admission: AD | Admit: 2015-02-11 | Discharge: 2015-02-11 | Disposition: A | Discharge status: Home / Self Care | Payer: Medicaid Other | Source: Ambulatory Visit | Attending: Obstetrics | Admitting: Obstetrics

## 2015-02-11 DIAGNOSIS — R079 Chest pain, unspecified: Secondary | ICD-10-CM | POA: Diagnosis present

## 2015-02-11 DIAGNOSIS — Z3A21 21 weeks gestation of pregnancy: Secondary | ICD-10-CM | POA: Insufficient documentation

## 2015-02-11 DIAGNOSIS — R0789 Other chest pain: Secondary | ICD-10-CM | POA: Insufficient documentation

## 2015-02-11 DIAGNOSIS — R51 Headache: Secondary | ICD-10-CM | POA: Diagnosis not present

## 2015-02-11 DIAGNOSIS — R519 Headache, unspecified: Secondary | ICD-10-CM

## 2015-02-11 DIAGNOSIS — O26892 Other specified pregnancy related conditions, second trimester: Secondary | ICD-10-CM | POA: Insufficient documentation

## 2015-02-11 DIAGNOSIS — Z87891 Personal history of nicotine dependence: Secondary | ICD-10-CM | POA: Diagnosis not present

## 2015-02-11 LAB — URINE MICROSCOPIC-ADD ON: BACTERIA UA: NONE SEEN

## 2015-02-11 LAB — URINALYSIS, ROUTINE W REFLEX MICROSCOPIC
BILIRUBIN URINE: NEGATIVE
Glucose, UA: NEGATIVE mg/dL
Ketones, ur: NEGATIVE mg/dL
Nitrite: NEGATIVE
PH: 6 (ref 5.0–8.0)
Protein, ur: NEGATIVE mg/dL
SPECIFIC GRAVITY, URINE: 1.025 (ref 1.005–1.030)

## 2015-02-11 MED ORDER — DIPHENHYDRAMINE HCL 50 MG/ML IJ SOLN
25.0000 mg | Freq: Once | INTRAMUSCULAR | Status: AC
  Administered 2015-02-11: 25 mg via INTRAVENOUS
  Filled 2015-02-11: qty 1

## 2015-02-11 MED ORDER — OXYCODONE-ACETAMINOPHEN 5-325 MG PO TABS: 1.0000 | ORAL_TABLET | Freq: Once | ORAL | Status: DC

## 2015-02-11 MED ORDER — METOCLOPRAMIDE HCL 5 MG/ML IJ SOLN
10.0000 mg | Freq: Once | INTRAMUSCULAR | Status: AC
  Administered 2015-02-11: 10 mg via INTRAVENOUS
  Filled 2015-02-11: qty 2

## 2015-02-11 MED ORDER — DEXAMETHASONE SODIUM PHOSPHATE 10 MG/ML IJ SOLN
10.0000 mg | Freq: Once | INTRAMUSCULAR | Status: AC
  Administered 2015-02-11: 10 mg via INTRAVENOUS
  Filled 2015-02-11: qty 1

## 2015-02-11 MED ORDER — LACTATED RINGERS IV SOLN
INTRAVENOUS | Status: DC
  Administered 2015-02-11: 17:00:00 via INTRAVENOUS

## 2015-02-11 MED ORDER — PROCHLORPERAZINE EDISYLATE 5 MG/ML IJ SOLN
10.0000 mg | Freq: Once | INTRAMUSCULAR | Status: DC
  Filled 2015-02-11: qty 2

## 2015-02-11 MED ORDER — DEXAMETHASONE SODIUM PHOSPHATE 10 MG/ML IJ SOLN
10.0000 mg | Freq: Once | INTRAMUSCULAR | Status: DC
  Filled 2015-02-11: qty 1

## 2015-02-11 MED ORDER — OXYCODONE-ACETAMINOPHEN 5-325 MG PO TABS
1.0000 | ORAL_TABLET | Freq: Four times a day (QID) | ORAL | Status: DC | PRN
  Administered 2015-02-11: 2 via ORAL
  Filled 2015-02-11: qty 2

## 2015-02-11 MED ORDER — DIPHENHYDRAMINE HCL 50 MG/ML IJ SOLN
25.0000 mg | Freq: Once | INTRAMUSCULAR | Status: DC
  Filled 2015-02-11: qty 1

## 2015-02-11 NOTE — MAU Note (Signed)
2 days having pain under both breasts feels like a lot of crashing pressure.  Migraine has come back took fiorcet and tylenol.  Took the seroquel as prescribed by doctor to help with sleep. Lots of ankle swelling

## 2015-02-11 NOTE — MAU Note (Signed)
2 days of pressure under both breasts took seroquel to help with sleep like doctor told.  Migraines started took fiorcet. vomitting x5 today. Lots more ankle swelling. Shooting pain from back down back of leg

## 2015-02-11 NOTE — MAU Provider Note (Signed)
History     CSN: [id number]  Arrival date and time: 02/11/15 1452   First Provider Initiated Contact with Patient 02/11/15 1515      Chief Complaint  Patient presents with  . Migraine  . Chest Pain   HPI Comments: Jacqueline Clarke is a 30 y.o. [id number] at [redacted]w[redacted]d who reports onset rib pain while at rest 6 PM on 02/10/2015. Indicates sharp pain below both breasts. She could not sleep comfortably and pain continued while at work today. She also has had unrelenting severe headache which she believes is typical of her migraine headaches. The headache was associated with nausea and vomiting today and she tried Fioricet earlier but did not retain the tablet. Also concerned that ankles are swollen.    Migraine  This is a recurrent problem. The current episode started yesterday. The problem occurs constantly. The problem has been waxing and waning. The pain is located in the bilateral, temporal and frontal (Pain below breasts left>right) region. The pain does not radiate. The pain quality is similar to prior headaches. The quality of the pain is described as shooting and stabbing (Feels like broken ribs). The pain is at a severity of 9/10. The pain is severe. Pertinent negatives include no abdominal pain, back pain, hearing loss, neck pain, numbness, phonophobia or weakness. The symptoms are aggravated by activity, bright light, work and noise. She has tried oral narcotics for the symptoms. The treatment provided no relief. Her past medical history is significant for migraine headaches.  Chest Pain  Pertinent negatives include no abdominal pain, back pain, numbness or weakness.    OB History  Gravida Para Term Preterm AB SAB TAB Ectopic Multiple Living  0 2 2 0 0  3    # Outcome Date GA Lbr Len/2nd Weight Sex Delivery Anes PTL Lv  6 Current           5 Term 07/22/11 [redacted]w[redacted]d 03:24 / 00:15 4.065 kg (8 lb 15.4 oz) F Vag-Spont EPI  Y     Comments: WNL  4 Term 2011 [redacted]w[redacted]d 04:00 3.118 kg (6  lb 14 oz) M Vag-Spont EPI  Y  3 Term 2010 [redacted]w[redacted]d  3.345 kg (7 lb 6 oz) M Vag-Spont EPI  Y  2 SAB 2009          1 SAB 2006                Past Medical History  Diagnosis Date  . Asthma   . Scoliosis   . Blood type, Rh negative   . Anemia   . Chronic back pain     scolosis- on percocet  . Hyperemesis complicating pregnancy, antepartum 2013  . Headache(784.0)   . Cholelithiasis   . Pregnancy induced hypertension   . 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 Obesity   . Depression   . Anxiety     Past Surgical History  Procedure Laterality Date  . Adenoidectomy    . Nerve, tendon and artery repair Right 06/18/2014    Procedure: NERVE, TENDON AND ARTERY REPAIR;  Surgeon: Bradly Bienenstock, MD;  Location: [facility] OR;  Service: Orthopedics;  Laterality: Right;  . Cholecystectomy    . Adenoidectomy    . Dilation and curettage of uterus  2008  . Arm surgery      Family History  Problem Relation Age of Onset  . Heart disease Maternal Grandfather   . Diabetes Maternal Grandfather   .  Anesthesia problems Neg Hx   . Heart disease Father     Social History  Substance Use Topics  . Smoking status: Former Smoker -- 0.50 packs/day    Types: Cigarettes    Quit date: 11/17/2010  . Smokeless tobacco: Never Used  . Alcohol Use: No     Comment: Occassionally    Allergies: No Known Allergies  Prescriptions prior to admission  Medication Sig Dispense Refill Last Dose  . acetaminophen (TYLENOL) 325 MG tablet Take 2 tablets (650 mg total) by mouth every 4 (four) hours as needed (for pain scale < 4  OR  temperature  >/=  100.5 F). (Patient taking differently: Take 1,000 mg by mouth every 4 (four) hours as needed for mild pain. ) 120 tablet 6 01/31/2015 at 1700  . butalbital-acetaminophen-caffeine (FIORICET) 50-325-40 MG tablet Take 1-2 tablets by mouth every 6 (six) hours as needed for headache. 45 tablet 4 01/31/2015 at 1600  . calcium carbonate (TUMS - DOSED IN MG ELEMENTAL CALCIUM)  500 MG chewable tablet Chew 2 tablets (400 mg of elemental calcium total) by mouth 3 (three) times daily with meals. 120 tablet 6 01/31/2015 at Unknown time  . clotrimazole (LOTRIMIN) 1 % cream Apply 1 application topically 2 (two) times daily.   Past Month at Unknown time  . cyclobenzaprine (FLEXERIL) 10 MG tablet Take 1 tablet (10 mg total) by mouth 3 (three) times daily as needed for muscle spasms. 30 tablet 0 01/31/2015 at 1600  . diphenhydrAMINE (BENADRYL) 25 MG tablet Take 2 tablets (50 mg total) by mouth at bedtime. 90 tablet 4 01/30/2015 at Unknown time  . docusate sodium (COLACE) 100 MG capsule Take 1 capsule (100 mg total) by mouth daily. 120 capsule 6 Past Week at Unknown time  . Doxylamine-Pyridoxine (DICLEGIS) 10-10 MG TBEC Take 1 tablet with breakfast and lunch.  Take 2 tablets at bedtime. 100 tablet 4 01/31/2015 at 1300  . famotidine (PEPCID) 20 MG tablet Take 1 tablet (20 mg total) by mouth 2 (two) times daily. 60 tablet 9 Past Week at Unknown time  . fluticasone (FLONASE) 50 MCG/ACT nasal spray Place 2 sprays into both nostrils daily. 16 g 2 Past Week at Unknown time  . gabapentin (NEURONTIN) 600 MG tablet Take 1 tablet (600 mg total) by mouth at bedtime. 60 tablet 6 01/30/2015 at Unknown time  . prazosin (MINIPRESS) 2 MG capsule Take 2 mg by mouth at bedtime. Reported on 01/24/2015  1 Past Week at Unknown time  . Prenat-FeCbn-FeAspGl-FA-Omega (OB COMPLETE PETITE) 35-5-1-200 MG CAPS Take 1 capsule by mouth daily before breakfast. 90 capsule 3 Past Week at Unknown time  . QUEtiapine (SEROQUEL) 100 MG tablet Take 1 tablet (100 mg total) by mouth at bedtime. 30 tablet 6 01/30/2015 at Unknown time    Review of Systems  HENT: Negative for hearing loss.   Cardiovascular: Positive for chest pain.  Gastrointestinal: Negative for abdominal pain.  Musculoskeletal: Negative for back pain and neck pain.  Neurological: Negative for weakness and numbness.   Physical Exam   Blood pressure 112/71,  pulse 84, temperature 98.4 F (36.9 C), temperature source Oral, resp. rate 20, height 5\' 10"  (1.778 m), weight 122.018 kg (269 lb), last menstrual period 09/09/2014, SpO2 100 %.  Physical Exam  Nursing note and vitals reviewed. Constitutional: She is oriented to person, place, and time. She appears well-developed and well-nourished. No distress.  HENT:  Head: Normocephalic.  Eyes: Pupils are equal, round, and reactive to light.  Neck: Normal range  of motion.  Respiratory: Effort normal and breath sounds normal. No respiratory distress.  Tender to deep palpation along left and right costochondral junctures and with rib compression. Deep breathing also elicits pain.  GI: There is no tenderness. There is no rebound.  Musculoskeletal: Normal range of motion.  Neurological: She is alert and oriented to person, place, and time.  Skin: Skin is warm and dry.  Psychiatric: She has a normal mood and affect. Her behavior is normal.    MAU Course  Procedures   Pt declined migraine cocktail. Percocet 5/325 2 tabs given> CP and H/A unimproved C/W Dr. Clearance Coots at 1705> recommends migraine cocktail and patient agrees Decadron Ceasar Mons /benadryl  IV given 1845: Dozed and now feels better, eating> will discharge   Assessment and Plan   1. Acute nonintractable headache, unspecified headache type   2. Anterior chest wall pain      Medication List    ASK your doctor about these medications        acetaminophen 325 MG tablet  Commonly known as:  TYLENOL  Take 2 tablets (650 mg total) by mouth every 4 (four) hours as needed (for pain scale < 4  OR  temperature  >/=  100.5 F).     butalbital-acetaminophen-caffeine 50-325-40 MG tablet  Commonly known as:  FIORICET  Take 1-2 tablets by mouth every 6 (six) hours as needed for headache.     calcium carbonate 500 MG chewable tablet  Commonly known as:  TUMS - dosed in mg elemental calcium  Chew 2 tablets (400 mg of elemental calcium  total) by mouth 3 (three) times daily with meals.     cyclobenzaprine 10 MG tablet  Commonly known as:  FLEXERIL  Take 1 tablet (10 mg total) by mouth 3 (three) times daily as needed for muscle spasms.     diphenhydrAMINE 25 MG tablet  Commonly known as:  BENADRYL  Take 2 tablets (50 mg total) by mouth at bedtime.     docusate sodium 100 MG capsule  Commonly known as:  COLACE  Take 1 capsule (100 mg total) by mouth daily.     Doxylamine-Pyridoxine 10-10 MG Tbec  Commonly known as:  DICLEGIS  Take 1 tablet with breakfast and lunch.  Take 2 tablets at bedtime.     famotidine 20 MG tablet  Commonly known as:  PEPCID  Take 1 tablet (20 mg total) by mouth 2 (two) times daily.     fluticasone 50 MCG/ACT nasal spray  Commonly known as:  FLONASE  Place 2 sprays into both nostrils daily.     gabapentin 600 MG tablet  Commonly known as:  NEURONTIN  Take 1 tablet (600 mg total) by mouth at bedtime.     OB COMPLETE PETITE 35-5-1-200 MG Caps  Take 1 capsule by mouth daily before breakfast.     prazosin 2 MG capsule  Commonly known as:  MINIPRESS  Take 2 mg by mouth at bedtime. Reported on 01/24/2015     QUEtiapine 100 MG tablet  Commonly known as:  SEROQUEL  Take 1 tablet (100 mg total) by mouth at bedtime.       Follow-up Information    Follow up with [facility] On 02/14/2015.   Why:  Keep your scheduled prenatal appointment   Contact information:   [address] [place] [address] [phone]      Itzel Lowrimore 02/11/2015, 3:18 PM  1. Acute nonintractable headache, unspecified headache type   2.  Anterior chest wall pain      Medication List    ASK your doctor about these medications        acetaminophen 325 MG tablet  Commonly known as:  TYLENOL  Take 2 tablets (650 mg total) by mouth every 4 (four) hours as needed (for pain scale < 4  OR  temperature  >/=  100.5 F).     butalbital-acetaminophen-caffeine 50-325-40 MG  tablet  Commonly known as:  FIORICET  Take 1-2 tablets by mouth every 6 (six) hours as needed for headache.     calcium carbonate 500 MG chewable tablet  Commonly known as:  TUMS - dosed in mg elemental calcium  Chew 2 tablets (400 mg of elemental calcium total) by mouth 3 (three) times daily with meals.     cyclobenzaprine 10 MG tablet  Commonly known as:  FLEXERIL  Take 1 tablet (10 mg total) by mouth 3 (three) times daily as needed for muscle spasms.     diphenhydrAMINE 25 MG tablet  Commonly known as:  BENADRYL  Take 2 tablets (50 mg total) by mouth at bedtime.     docusate sodium 100 MG capsule  Commonly known as:  COLACE  Take 1 capsule (100 mg total) by mouth daily.     Doxylamine-Pyridoxine 10-10 MG Tbec  Commonly known as:  DICLEGIS  Take 1 tablet with breakfast and lunch.  Take 2 tablets at bedtime.     famotidine 20 MG tablet  Commonly known as:  PEPCID  Take 1 tablet (20 mg total) by mouth 2 (two) times daily.     fluticasone 50 MCG/ACT nasal spray  Commonly known as:  FLONASE  Place 2 sprays into both nostrils daily.     gabapentin 600 MG tablet  Commonly known as:  NEURONTIN  Take 1 tablet (600 mg total) by mouth at bedtime.     OB COMPLETE PETITE 35-5-1-200 MG Caps  Take 1 capsule by mouth daily before breakfast.     prazosin 2 MG capsule  Commonly known as:  MINIPRESS  Take 2 mg by mouth at bedtime. Reported on 01/24/2015     QUEtiapine 100 MG tablet  Commonly known as:  SEROQUEL  Take 1 tablet (100 mg total) by mouth at bedtime.       Follow-up Information    Follow up with [facility] On 02/14/2015.   Why:  Keep your scheduled prenatal appointment   Contact information:   [address] [place] [address] [phone]

## 2015-02-14 ENCOUNTER — Encounter: Payer: Medicaid Other | Admitting: Certified Nurse Midwife

## 2015-02-21 ENCOUNTER — Encounter: Payer: Medicaid Other | Admitting: Certified Nurse Midwife

## 2015-02-27 ENCOUNTER — Ambulatory Visit (INDEPENDENT_AMBULATORY_CARE_PROVIDER_SITE_OTHER): Payer: Medicaid Other | Admitting: Certified Nurse Midwife

## 2015-02-27 VITALS — BP 118/68 | HR 89 | Temp 98.5°F | Wt 283.0 lb

## 2015-02-27 DIAGNOSIS — O26892 Other specified pregnancy related conditions, second trimester: Secondary | ICD-10-CM

## 2015-02-27 DIAGNOSIS — Z3482 Encounter for supervision of other normal pregnancy, second trimester: Secondary | ICD-10-CM

## 2015-02-27 DIAGNOSIS — R51 Headache: Secondary | ICD-10-CM

## 2015-02-27 LAB — POCT URINALYSIS DIPSTICK
BILIRUBIN UA: NEGATIVE
Glucose, UA: NEGATIVE
KETONES UA: NEGATIVE
Nitrite, UA: NEGATIVE
PH UA: 5
Protein, UA: NEGATIVE
Spec Grav, UA: 1.015
Urobilinogen, UA: NEGATIVE

## 2015-02-27 MED ORDER — SUMATRIPTAN SUCCINATE 100 MG PO TABS: 100.0000 mg | ORAL_TABLET | Freq: Once | ORAL | Status: DC | PRN

## 2015-02-27 MED ORDER — BUTALBITAL-APAP-CAFFEINE 50-325-40 MG PO TABS: 1.0000 | ORAL_TABLET | Freq: Four times a day (QID) | ORAL | Status: DC | PRN

## 2015-02-27 NOTE — Progress Notes (Signed)
Subjective:    Jacqueline Clarke is a 30 y.o. female being seen today for her obstetrical visit. She is at [redacted]w[redacted]d gestation. Patient reports: backache, no bleeding, no contractions, no cramping and no leaking . Fetal movement: normal.  Problem List Items Addressed This Visit    None    Visit Diagnoses    Headache in pregnancy, antepartum, second trimester    -  Primary    Relevant Medications    butalbital-acetaminophen-caffeine (FIORICET) 50-325-40 MG tablet    SUMAtriptan (IMITREX) 100 MG tablet    Supervision of other normal pregnancy, antepartum, second trimester        Relevant Orders    [place] [facility] OB FOLLOW UP      Patient Active Problem List   Diagnosis Date Noted  . Hyperemesis gravidarum before end of [redacted] week gestation, dehydration 11/06/2014  . Malnutrition of moderate degree 11/03/2014  . Depressive disorder 12/14/2012  . Normal delivery 07/24/2011   Objective:    BP 118/68 mmHg  Pulse 89  Temp(Src) 98.5 F (36.9 C)  Wt 283 lb (128.368 kg)  LMP 09/09/2014 FHT: 145 BPM  Uterine Size: size equals dates     Assessment:    Pregnancy @ [redacted]w[redacted]d    Lumbar back pain HA in pregnancy  Plan:   Rx: maternity abdominal support belt  OBGCT: discussed. Signs and symptoms of preterm labor: discussed.  Labs, problem list reviewed and updated 2 hr GTT planned Follow up in 4 weeks with OGTT.

## 2015-03-06 ENCOUNTER — Other Ambulatory Visit: Payer: Self-pay | Admitting: Certified Nurse Midwife

## 2015-03-06 ENCOUNTER — Ambulatory Visit (HOSPITAL_COMMUNITY)
Admission: RE | Admit: 2015-03-06 | Discharge: 2015-03-06 | Disposition: A | Discharge status: Home / Self Care | Payer: Medicaid Other | Source: Ambulatory Visit | Attending: Certified Nurse Midwife | Admitting: Certified Nurse Midwife

## 2015-03-06 DIAGNOSIS — O09292 Supervision of pregnancy with other poor reproductive or obstetric history, second trimester: Secondary | ICD-10-CM | POA: Insufficient documentation

## 2015-03-06 DIAGNOSIS — Z6791 Unspecified blood type, Rh negative: Secondary | ICD-10-CM | POA: Diagnosis not present

## 2015-03-06 DIAGNOSIS — Z3A24 24 weeks gestation of pregnancy: Secondary | ICD-10-CM | POA: Diagnosis not present

## 2015-03-06 DIAGNOSIS — O99519 Diseases of the respiratory system complicating pregnancy, unspecified trimester: Secondary | ICD-10-CM

## 2015-03-06 DIAGNOSIS — O26892 Other specified pregnancy related conditions, second trimester: Secondary | ICD-10-CM | POA: Insufficient documentation

## 2015-03-06 DIAGNOSIS — O99212 Obesity complicating pregnancy, second trimester: Secondary | ICD-10-CM | POA: Insufficient documentation

## 2015-03-06 DIAGNOSIS — Z3482 Encounter for supervision of other normal pregnancy, second trimester: Secondary | ICD-10-CM

## 2015-03-06 DIAGNOSIS — J45909 Unspecified asthma, uncomplicated: Secondary | ICD-10-CM

## 2015-03-06 DIAGNOSIS — O360120 Maternal care for anti-D [Rh] antibodies, second trimester, not applicable or unspecified: Secondary | ICD-10-CM

## 2015-03-09 ENCOUNTER — Other Ambulatory Visit: Payer: Self-pay | Admitting: Certified Nurse Midwife

## 2015-03-16 ENCOUNTER — Encounter (HOSPITAL_COMMUNITY): Payer: Self-pay

## 2015-03-16 ENCOUNTER — Emergency Department (HOSPITAL_COMMUNITY)
Admission: EM | Admit: 2015-03-16 | Discharge: 2015-03-16 | Disposition: A | Discharge status: Home / Self Care | Payer: Medicaid Other | Source: Home / Self Care | Attending: Emergency Medicine | Admitting: Emergency Medicine

## 2015-03-16 ENCOUNTER — Inpatient Hospital Stay (HOSPITAL_COMMUNITY): Payer: Medicaid Other

## 2015-03-16 ENCOUNTER — Inpatient Hospital Stay (HOSPITAL_COMMUNITY)
Admission: AD | Admit: 2015-03-16 | Discharge: 2015-03-16 | Disposition: A | Discharge status: Home / Self Care | Payer: Medicaid Other | Source: Ambulatory Visit | Attending: Obstetrics | Admitting: Obstetrics

## 2015-03-16 DIAGNOSIS — M419 Scoliosis, unspecified: Secondary | ICD-10-CM | POA: Insufficient documentation

## 2015-03-16 DIAGNOSIS — O9989 Other specified diseases and conditions complicating pregnancy, childbirth and the puerperium: Secondary | ICD-10-CM | POA: Insufficient documentation

## 2015-03-16 DIAGNOSIS — O212 Late vomiting of pregnancy: Secondary | ICD-10-CM | POA: Insufficient documentation

## 2015-03-16 DIAGNOSIS — R6889 Other general symptoms and signs: Secondary | ICD-10-CM

## 2015-03-16 DIAGNOSIS — O2342 Unspecified infection of urinary tract in pregnancy, second trimester: Secondary | ICD-10-CM | POA: Insufficient documentation

## 2015-03-16 DIAGNOSIS — Z79899 Other long term (current) drug therapy: Secondary | ICD-10-CM

## 2015-03-16 DIAGNOSIS — G8929 Other chronic pain: Secondary | ICD-10-CM

## 2015-03-16 DIAGNOSIS — R05 Cough: Secondary | ICD-10-CM | POA: Insufficient documentation

## 2015-03-16 DIAGNOSIS — Z8719 Personal history of other diseases of the digestive system: Secondary | ICD-10-CM | POA: Insufficient documentation

## 2015-03-16 DIAGNOSIS — B9689 Other specified bacterial agents as the cause of diseases classified elsewhere: Secondary | ICD-10-CM

## 2015-03-16 DIAGNOSIS — Z8614 Personal history of Methicillin resistant Staphylococcus aureus infection: Secondary | ICD-10-CM

## 2015-03-16 DIAGNOSIS — Z87891 Personal history of nicotine dependence: Secondary | ICD-10-CM | POA: Insufficient documentation

## 2015-03-16 DIAGNOSIS — N760 Acute vaginitis: Secondary | ICD-10-CM | POA: Insufficient documentation

## 2015-03-16 DIAGNOSIS — O23592 Infection of other part of genital tract in pregnancy, second trimester: Secondary | ICD-10-CM | POA: Insufficient documentation

## 2015-03-16 DIAGNOSIS — R109 Unspecified abdominal pain: Secondary | ICD-10-CM | POA: Diagnosis present

## 2015-03-16 DIAGNOSIS — J45909 Unspecified asthma, uncomplicated: Secondary | ICD-10-CM | POA: Insufficient documentation

## 2015-03-16 DIAGNOSIS — R509 Fever, unspecified: Secondary | ICD-10-CM | POA: Insufficient documentation

## 2015-03-16 DIAGNOSIS — O4692 Antepartum hemorrhage, unspecified, second trimester: Secondary | ICD-10-CM

## 2015-03-16 DIAGNOSIS — O99512 Diseases of the respiratory system complicating pregnancy, second trimester: Secondary | ICD-10-CM | POA: Insufficient documentation

## 2015-03-16 DIAGNOSIS — F329 Major depressive disorder, single episode, unspecified: Secondary | ICD-10-CM | POA: Diagnosis not present

## 2015-03-16 DIAGNOSIS — R059 Cough, unspecified: Secondary | ICD-10-CM

## 2015-03-16 DIAGNOSIS — E669 Obesity, unspecified: Secondary | ICD-10-CM | POA: Insufficient documentation

## 2015-03-16 DIAGNOSIS — N888 Other specified noninflammatory disorders of cervix uteri: Secondary | ICD-10-CM

## 2015-03-16 DIAGNOSIS — Z3A26 26 weeks gestation of pregnancy: Secondary | ICD-10-CM

## 2015-03-16 DIAGNOSIS — O99342 Other mental disorders complicating pregnancy, second trimester: Secondary | ICD-10-CM | POA: Insufficient documentation

## 2015-03-16 DIAGNOSIS — F419 Anxiety disorder, unspecified: Secondary | ICD-10-CM

## 2015-03-16 DIAGNOSIS — Z862 Personal history of diseases of the blood and blood-forming organs and certain disorders involving the immune mechanism: Secondary | ICD-10-CM | POA: Insufficient documentation

## 2015-03-16 DIAGNOSIS — A499 Bacterial infection, unspecified: Secondary | ICD-10-CM

## 2015-03-16 DIAGNOSIS — O468X2 Other antepartum hemorrhage, second trimester: Secondary | ICD-10-CM

## 2015-03-16 DIAGNOSIS — N939 Abnormal uterine and vaginal bleeding, unspecified: Secondary | ICD-10-CM | POA: Diagnosis present

## 2015-03-16 DIAGNOSIS — O99212 Obesity complicating pregnancy, second trimester: Secondary | ICD-10-CM

## 2015-03-16 LAB — URINE MICROSCOPIC-ADD ON

## 2015-03-16 LAB — WET PREP, GENITAL
Sperm: NONE SEEN
Trich, Wet Prep: NONE SEEN
YEAST WET PREP: NONE SEEN

## 2015-03-16 LAB — URINALYSIS, ROUTINE W REFLEX MICROSCOPIC
GLUCOSE, UA: NEGATIVE mg/dL
KETONES UR: 15 mg/dL — AB
NITRITE: NEGATIVE
PROTEIN: NEGATIVE mg/dL
Specific Gravity, Urine: 1.025 (ref 1.005–1.030)
pH: 6 (ref 5.0–8.0)

## 2015-03-16 MED ORDER — CEPHALEXIN 500 MG PO CAPS: 500.0000 mg | ORAL_CAPSULE | Freq: Four times a day (QID) | ORAL | Status: DC

## 2015-03-16 MED ORDER — BENZONATATE 100 MG PO CAPS: 100.0000 mg | ORAL_CAPSULE | Freq: Three times a day (TID) | ORAL | Status: DC

## 2015-03-16 MED ORDER — RHO D IMMUNE GLOBULIN 1500 UNIT/2ML IJ SOSY
300.0000 ug | PREFILLED_SYRINGE | Freq: Once | INTRAMUSCULAR | Status: AC
  Administered 2015-03-16: 300 ug via INTRAMUSCULAR
  Filled 2015-03-16: qty 2

## 2015-03-16 MED ORDER — METRONIDAZOLE 500 MG PO TABS: 500.0000 mg | ORAL_TABLET | Freq: Two times a day (BID) | ORAL | Status: DC

## 2015-03-16 MED ORDER — OSELTAMIVIR PHOSPHATE 75 MG PO CAPS: 75.0000 mg | ORAL_CAPSULE | Freq: Two times a day (BID) | ORAL | Status: DC

## 2015-03-16 NOTE — ED Notes (Signed)
Patient transported by [facility] from home.  Patient c/o productive cough, N/V, runny nose x 1wk.   Denies fever. Patient 6mos pregnant.

## 2015-03-16 NOTE — MAU Note (Signed)
Pt presents complaining of sudden onset of vaginal bleeding and abdominal pain that started at 0720. Denies leaking of fluid. Denies hx of preterm labor

## 2015-03-16 NOTE — Discharge Instructions (Signed)

## 2015-03-16 NOTE — Progress Notes (Signed)
Notified of pt dr being dr Clearance Coots not faculty. Will see pt

## 2015-03-16 NOTE — ED Provider Notes (Signed)
CSN: [id number]     Arrival date & time 03/16/15  0204 History   First MD Initiated Contact with Patient 03/16/15 0253     Chief Complaint  Patient presents with  . Emesis     (Consider location/radiation/quality/duration/timing/severity/associated sxs/prior Treatment) HPI Comments: Patient who is [redacted] weeks pregnant presents to the emergency department with chief complaint of cough, nausea, vomiting, body aches, subjective fevers, and chills. She states that she has had the symptoms for almost a week. She states that she started having some vomiting today. She denies any abdominal pain. Denies any vaginal discharge or bleeding. She has not tried taking anything for her symptoms. She denies any dysuria.  The history is provided by the patient. No language interpreter was used.    Past Medical History  Diagnosis Date  . Asthma   . Scoliosis   . Blood type, Rh negative   . Anemia   . Chronic back pain     scolosis- on percocet  . Hyperemesis complicating pregnancy, antepartum 2013  . Headache(784.0)   . Cholelithiasis   . Pregnancy induced hypertension   . 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 Obesity   . Depression   . Anxiety    Past Surgical History  Procedure Laterality Date  . Adenoidectomy    . Nerve, tendon and artery repair Right 06/18/2014    Procedure: NERVE, TENDON AND ARTERY REPAIR;  Surgeon: Bradly Bienenstock, MD;  Location: [facility] OR;  Service: Orthopedics;  Laterality: Right;  . Cholecystectomy    . Adenoidectomy    . Dilation and curettage of uterus  2008  . Arm surgery     Family History  Problem Relation Age of Onset  . Heart disease Maternal Grandfather   . Diabetes Maternal Grandfather   . Anesthesia problems Neg Hx   . Heart disease Father    Social History  Substance Use Topics  . Smoking status: Former Smoker -- 0.50 packs/day    Types: Cigarettes    Quit date: 11/17/2010  . Smokeless tobacco: Never Used  . Alcohol Use: No     Comment:  Occassionally   OB History    Gravida Para Term Preterm AB TAB SAB Ectopic Multiple Living   0 2 0 2 0  3     Review of Systems  All other systems reviewed and are negative.     Allergies  Review of patient's allergies indicates no known allergies.  Home Medications   Prior to Admission medications   Medication Sig Start Date End Date Taking? Authorizing Provider  butalbital-acetaminophen-caffeine (FIORICET) 50-325-40 MG tablet Take 1-2 tablets by mouth every 6 (six) hours as needed for headache. 02/27/15  Yes Rachelle A Denney, CNM  calcium carbonate (TUMS - DOSED IN MG ELEMENTAL CALCIUM) 500 MG chewable tablet Chew 2 tablets (400 mg of elemental calcium total) by mouth 3 (three) times daily with meals. 11/07/14  Yes Rachelle A Denney, CNM  Lurasidone HCl (LATUDA PO) Take 1 tablet by mouth daily.   Yes Historical Provider, MD  QUEtiapine (SEROQUEL) 100 MG tablet Take 1 tablet (100 mg total) by mouth at bedtime. 11/07/14  Yes Rachelle A Denney, CNM  SUMAtriptan (IMITREX) 100 MG tablet Take 1 tablet (100 mg total) by mouth once as needed for migraine. May repeat in 2 hours if headache persists or recurs. 02/27/15  Yes Rachelle A Denney, CNM  acetaminophen (TYLENOL) 325 MG tablet Take 2 tablets (650 mg total) by  mouth every 4 (four) hours as needed (for pain scale < 4  OR  temperature  >/=  100.5 F). Patient not taking: Reported on 03/16/2015 11/07/14   Rachelle A Denney, CNM  cyclobenzaprine (FLEXERIL) 10 MG tablet Take 1 tablet (10 mg total) by mouth 3 (three) times daily as needed for muscle spasms. Patient not taking: Reported on 03/16/2015 01/24/15   Roe Coombs, CNM  diphenhydrAMINE (BENADRYL) 25 MG tablet Take 2 tablets (50 mg total) by mouth at bedtime. Patient not taking: Reported on 03/16/2015 01/24/15   Rachelle A Denney, CNM  docusate sodium (COLACE) 100 MG capsule Take 1 capsule (100 mg total) by mouth daily. Patient not taking: Reported on 03/16/2015 11/07/14   Rachelle A  Denney, CNM  Doxylamine-Pyridoxine (DICLEGIS) 10-10 MG TBEC Take 1 tablet with breakfast and lunch.  Take 2 tablets at bedtime. Patient not taking: Reported on 03/16/2015 11/09/14   Roe Coombs, CNM  famotidine (PEPCID) 20 MG tablet Take 1 tablet (20 mg total) by mouth 2 (two) times daily. Patient not taking: Reported on 03/16/2015 11/07/14   Rachelle A Denney, CNM  fluticasone (FLONASE) 50 MCG/ACT nasal spray Place 2 sprays into both nostrils daily. Patient not taking: Reported on 03/16/2015 12/27/14   Rachelle A Denney, CNM  gabapentin (NEURONTIN) 600 MG tablet Take 1 tablet (600 mg total) by mouth at bedtime. Patient not taking: Reported on 03/16/2015 11/07/14   Rachelle A Denney, CNM  Prenat-FeCbn-FeAspGl-FA-Omega (OB COMPLETE PETITE) 35-5-1-200 MG CAPS Take 1 capsule by mouth daily before breakfast. Patient not taking: Reported on 03/16/2015 08/21/14   Brock Bad, MD   BP 111/77 mmHg  Pulse 101  Temp(Src) 98.5 F (36.9 C) (Oral)  Resp 16  SpO2 98%  LMP 09/09/2014 Physical Exam  Constitutional: She is oriented to person, place, and time. She appears well-developed and well-nourished.  HENT:  Head: Normocephalic and atraumatic.  Eyes: Conjunctivae and EOM are normal. Pupils are equal, round, and reactive to light.  Neck: Normal range of motion. Neck supple.  Cardiovascular: Normal rate and regular rhythm.  Exam reveals no gallop and no friction rub.   No murmur heard. Pulmonary/Chest: Effort normal and breath sounds normal. No respiratory distress. She has no wheezes. She has no rales. She exhibits no tenderness.  Abdominal: Soft. Bowel sounds are normal. She exhibits no distension and no mass. There is no tenderness. There is no rebound and no guarding.  No focal abdominal tenderness, no RLQ tenderness or pain at McBurney's point, no RUQ tenderness or Murphy's sign, no left-sided abdominal tenderness, no fluid wave, or signs of peritonitis   Musculoskeletal: Normal range of  motion. She exhibits no edema or tenderness.  Neurological: She is alert and oriented to person, place, and time.  Skin: Skin is warm and dry.  Psychiatric: She has a normal mood and affect. Her behavior is normal. Judgment and thought content normal.  Nursing note and vitals reviewed.   ED Course  Procedures (including critical care time)   MDM   Final diagnoses:  Flu-like symptoms    Patient with symptoms consistent with influenza.  Vitals are stable, low-grade fever.  No signs of dehydration, tolerating PO's.  Lungs are clear. Due to patient's presentation and physical exam a chest x-ray was not ordered bc likely diagnosis of flu.  Patient will be discharged with instructions to orally hydrate, rest, and use Tylenol for fever.  Will give tamiflu because of pregnancy.  Discussed treatment with Dr. Juleen China.  Roxy Horseman, PA-C 03/16/15 0309  Raeford Razor, MD 03/16/15 [phone]

## 2015-03-16 NOTE — Discharge Instructions (Signed)
Bacterial Vaginosis Bacterial vaginosis is a vaginal infection that occurs when the normal balance of bacteria in the vagina is disrupted. It results from an overgrowth of certain bacteria. This is the most common vaginal infection in women of childbearing age. Treatment is important to prevent complications, especially in pregnant women, as it can cause a premature delivery. CAUSES  Bacterial vaginosis is caused by an increase in harmful bacteria that are normally present in smaller amounts in the vagina. Several different kinds of bacteria can cause bacterial vaginosis. However, the reason that the condition develops is not fully understood. RISK FACTORS Certain activities or behaviors can put you at an increased risk of developing bacterial vaginosis, including:  Having a new sex partner or multiple sex partners.  Douching.  Using an intrauterine device (IUD) for contraception. Women do not get bacterial vaginosis from toilet seats, bedding, swimming pools, or contact with objects around them. SIGNS AND SYMPTOMS  Some women with bacterial vaginosis have no signs or symptoms. Common symptoms include:  Grey vaginal discharge.  A fishlike odor with discharge, especially after sexual intercourse.  Itching or burning of the vagina and vulva.  Burning or pain with urination. DIAGNOSIS  Your health care provider will take a medical history and examine the vagina for signs of bacterial vaginosis. A sample of vaginal fluid may be taken. Your health care provider will look at this sample under a microscope to check for bacteria and abnormal cells. A vaginal pH test may also be done.  TREATMENT  Bacterial vaginosis may be treated with antibiotic medicines. These may be given in the form of a pill or a vaginal cream. A second round of antibiotics may be prescribed if the condition comes back after treatment. Because bacterial vaginosis increases your risk for sexually transmitted diseases, getting  treated can help reduce your risk for chlamydia, gonorrhea, HIV, and herpes. HOME CARE INSTRUCTIONS   Only take over-the-counter or prescription medicines as directed by your health care provider.  If antibiotic medicine was prescribed, take it as directed. Make sure you finish it even if you start to feel better.  Tell all sexual partners that you have a vaginal infection. They should see their health care provider and be treated if they have problems, such as a mild rash or itching.  During treatment, it is important that you follow these instructions:  Avoid sexual activity or use condoms correctly.  Do not douche.  Avoid alcohol as directed by your health care provider.  Avoid breastfeeding as directed by your health care provider. SEEK MEDICAL CARE IF:   Your symptoms are not improving after 3 days of treatment.  You have increased discharge or pain.  You have a fever. MAKE SURE YOU:   Understand these instructions.  Will watch your condition.  Will get help right away if you are not doing well or get worse. FOR MORE INFORMATION  Centers for Disease Control and Prevention, Division of STD Prevention: [employer]Apps.[employer]za American Sexual Health Association (ASHA): www.ashastd.org    This information is not intended to replace advice given to you by your health care provider. Make sure you discuss any questions you have with your health care provider.   Document Released: 01/06/2005 Document Revised: 01/27/2014 Document Reviewed: 08/18/2012 Elsevier Interactive Patient Education 2016 Elsevier Inc. Pregnancy and Urinary Tract Infection A urinary tract infection (UTI) is a bacterial infection of the urinary tract. Infection of the urinary tract can include the ureters, kidneys (pyelonephritis), bladder (cystitis), and urethra (urethritis). All pregnant women  should be screened for bacteria in the urinary tract. Identifying and treating a UTI will decrease the risk of preterm  labor and developing more serious infections in both the mother and baby. CAUSES Bacteria germs cause almost all UTIs.  RISK FACTORS Many factors can increase your chances of getting a UTI during pregnancy. These include:  Having a short urethra.  Poor toilet and hygiene habits.  Sexual intercourse.  Blockage of urine along the urinary tract.  Problems with the pelvic muscles or nerves.  Diabetes.  Obesity.  Bladder problems after having several children.  Previous history of UTI. SIGNS AND SYMPTOMS   Pain, burning, or a stinging feeling when urinating.  Suddenly feeling the need to urinate right away (urgency).  Loss of bladder control (urinary incontinence).  Frequent urination, more than is common with pregnancy.  Lower abdominal or back discomfort.  Cloudy urine.  Blood in the urine (hematuria).  Fever. When the kidneys are infected, the symptoms may be:  Back pain.  Flank pain on the right side more so than the left.  Fever.  Chills.  Nausea.  Vomiting. DIAGNOSIS  A urinary tract infection is usually diagnosed through urine tests. Additional tests and procedures are sometimes done. These may include:  Ultrasound exam of the kidneys, ureters, bladder, and urethra.  Looking in the bladder with a lighted tube (cystoscopy). TREATMENT Typically, UTIs can be treated with antibiotic medicines.  HOME CARE INSTRUCTIONS   Only take over-the-counter or prescription medicines as directed by your health care provider. If you were prescribed antibiotics, take them as directed. Finish them even if you start to feel better.  Drink enough fluids to keep your urine clear or pale yellow.  Do not have sexual intercourse until the infection is gone and your health care provider says it is okay.  Make sure you are tested for UTIs throughout your pregnancy. These infections often come back. Preventing a UTI in the Future  Practice good toilet habits. Always  wipe from front to back. Use the tissue only once.  Do not hold your urine. Empty your bladder as soon as possible when the urge comes.  Do not douche or use deodorant sprays.  Wash with soap and warm water around the genital area and the anus.  Empty your bladder before and after sexual intercourse.  Wear underwear with a cotton crotch.  Avoid caffeine and carbonated drinks. They can irritate the bladder.  Drink cranberry juice or take cranberry pills. This may decrease the risk of getting a UTI.  Do not drink alcohol.  Keep all your appointments and tests as scheduled. SEEK MEDICAL CARE IF:   Your symptoms get worse.  You are still having fevers 2 or more days after treatment begins.  You have a rash.  You feel that you are having problems with medicines prescribed.  You have abnormal vaginal discharge. SEEK IMMEDIATE MEDICAL CARE IF:   You have back or flank pain.  You have chills.  You have blood in your urine.  You have nausea and vomiting.  You have contractions of your uterus.  You have a gush of fluid from the vagina. MAKE SURE YOU:  Understand these instructions.   Will watch your condition.   Will get help right away if you are not doing well or get worse.    This information is not intended to replace advice given to you by your health care provider. Make sure you discuss any questions you have with your health care provider.  Document Released: 05/03/2010 Document Revised: 10/27/2012 Document Reviewed: 08/05/2012 Elsevier Interactive Patient Education [employer]2016 Elsevier Inc.

## 2015-03-16 NOTE — Progress Notes (Signed)
Notified of pt arrival in MAU and complaint. Will come see pt 

## 2015-03-16 NOTE — MAU Provider Note (Signed)
History     CSN: [id number]  Arrival date and time: 03/16/15 2013   First Provider Initiated Contact with Patient 03/16/15 2126      Chief Complaint  Patient presents with  . Abdominal Pain  . Vaginal Bleeding   HPI   Jacqueline Clarke is a 30 y.o. female [id number] at [redacted]w[redacted]d presenting to MAU with vaginal bleeding and abdominal pain. She first noticed the vaginal spotting at 7:20 pm after urinating. She initially noticed that her urine was very dark in color, then she noticed bright red blood in the toilet.  She is currently being treated for the flu; was never tested however being treated empirically.   She has been coughing a lot, coughing hard at times. She noticed the bleeding after a coughing episode.   Shortly after the vaginal bleeding she started having lower abdominal pain. The pain radiates to her lower back. The pain worsens when she walks. Her pain is currently 8.5. She does not feel like the pain is contraction related; she feels pressure.   Denies history of preterm labor or preterm delivery.  + fetal movements.   OB History    Gravida Para Term Preterm AB TAB SAB Ectopic Multiple Living   6 3 3  0 2 0 2 0  3      Past Medical History  Diagnosis Date  . Asthma   . Scoliosis   . Blood type, Rh negative   . Anemia   . Chronic back pain     scolosis- on percocet  . Hyperemesis complicating pregnancy, antepartum 2013  . Headache(784.0)   . Cholelithiasis   . Pregnancy induced hypertension   . 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 Obesity   . Depression   . Anxiety     Past Surgical History  Procedure Laterality Date  . Adenoidectomy    . Nerve, tendon and artery repair Right 06/18/2014    Procedure: NERVE, TENDON AND ARTERY REPAIR;  Surgeon: Bradly Bienenstock, MD;  Location: [facility] OR;  Service: Orthopedics;  Laterality: Right;  . Cholecystectomy    . Adenoidectomy    . Dilation and curettage of uterus  2008  . Arm surgery      Family  History  Problem Relation Age of Onset  . Heart disease Maternal Grandfather   . Diabetes Maternal Grandfather   . Anesthesia problems Neg Hx   . Heart disease Father     Social History  Substance Use Topics  . Smoking status: Former Smoker -- 0.50 packs/day    Types: Cigarettes    Quit date: 11/17/2010  . Smokeless tobacco: Never Used  . Alcohol Use: No     Comment: Occassionally    Allergies: No Known Allergies  Prescriptions prior to admission  Medication Sig Dispense Refill Last Dose  . acetaminophen (TYLENOL) 325 MG tablet Take 2 tablets (650 mg total) by mouth every 4 (four) hours as needed (for pain scale < 4  OR  temperature  >/=  100.5 F). (Patient not taking: Reported on 03/16/2015) 120 tablet 6 Taking  . butalbital-acetaminophen-caffeine (FIORICET) 50-325-40 MG tablet Take 1-2 tablets by mouth every 6 (six) hours as needed for headache. 45 tablet 4 03/15/2015 at Unknown time  . calcium carbonate (TUMS - DOSED IN MG ELEMENTAL CALCIUM) 500 MG chewable tablet Chew 2 tablets (400 mg of elemental calcium total) by mouth 3 (three) times daily with meals. 120 tablet 6 Past Month at Unknown time  .  cyclobenzaprine (FLEXERIL) 10 MG tablet Take 1 tablet (10 mg total) by mouth 3 (three) times daily as needed for muscle spasms. (Patient not taking: Reported on 03/16/2015) 30 tablet 0 Taking  . diphenhydrAMINE (BENADRYL) 25 MG tablet Take 2 tablets (50 mg total) by mouth at bedtime. (Patient not taking: Reported on 03/16/2015) 90 tablet 4 Taking  . docusate sodium (COLACE) 100 MG capsule Take 1 capsule (100 mg total) by mouth daily. (Patient not taking: Reported on 03/16/2015) 120 capsule 6 Taking  . Doxylamine-Pyridoxine (DICLEGIS) 10-10 MG TBEC Take 1 tablet with breakfast and lunch.  Take 2 tablets at bedtime. (Patient not taking: Reported on 03/16/2015) 100 tablet 4 Taking  . famotidine (PEPCID) 20 MG tablet Take 1 tablet (20 mg total) by mouth 2 (two) times daily. (Patient not taking:  Reported on 03/16/2015) 60 tablet 9 Taking  . fluticasone (FLONASE) 50 MCG/ACT nasal spray Place 2 sprays into both nostrils daily. (Patient not taking: Reported on 03/16/2015) 16 g 2 Taking  . gabapentin (NEURONTIN) 600 MG tablet Take 1 tablet (600 mg total) by mouth at bedtime. (Patient not taking: Reported on 03/16/2015) 60 tablet 6 Taking  . Lurasidone HCl (LATUDA PO) Take 1 tablet by mouth daily.   03/15/2015 at Unknown time  . oseltamivir (TAMIFLU) 75 MG capsule Take 1 capsule (75 mg total) by mouth every 12 (twelve) hours. 10 capsule 0   . Prenat-FeCbn-FeAspGl-FA-Omega (OB COMPLETE PETITE) 35-5-1-200 MG CAPS Take 1 capsule by mouth daily before breakfast. (Patient not taking: Reported on 03/16/2015) 90 capsule 3 Taking  . QUEtiapine (SEROQUEL) 100 MG tablet Take 1 tablet (100 mg total) by mouth at bedtime. 30 tablet 6 03/15/2015 at Unknown time  . SUMAtriptan (IMITREX) 100 MG tablet Take 1 tablet (100 mg total) by mouth once as needed for migraine. May repeat in 2 hours if headache persists or recurs. 9 tablet 11 Past Month at Unknown time   Results for orders placed or performed during the hospital encounter of 03/16/15 (from the past 48 hour(s))  Urinalysis, Routine w reflex microscopic (not at [facility])     Status: Abnormal   Collection Time: 03/16/15  9:30 PM  Result Value Ref Range   Color, Urine YELLOW YELLOW   APPearance CLEAR CLEAR   Specific Gravity, Urine 1.025 1.005 - 1.030   pH 6.0 5.0 - 8.0   Glucose, UA NEGATIVE NEGATIVE mg/dL   Hgb urine dipstick LARGE (A) NEGATIVE   Bilirubin Urine SMALL (A) NEGATIVE   Ketones, ur 15 (A) NEGATIVE mg/dL   Protein, ur NEGATIVE NEGATIVE mg/dL   Nitrite NEGATIVE NEGATIVE   Leukocytes, UA SMALL (A) NEGATIVE  Urine microscopic-add on     Status: Abnormal   Collection Time: 03/16/15  9:30 PM  Result Value Ref Range   Squamous Epithelial / LPF 6-30 (A) NONE SEEN   WBC, UA 0-5 0 - 5 WBC/hpf   RBC / HPF 0-5 0 - 5 RBC/hpf   Bacteria, UA MANY (A) NONE  SEEN   Urine-Other MUCOUS PRESENT   Wet prep, genital     Status: Abnormal   Collection Time: 03/16/15  9:45 PM  Result Value Ref Range   Yeast Wet Prep HPF POC NONE SEEN NONE SEEN   Trich, Wet Prep NONE SEEN NONE SEEN   Clue Cells Wet Prep HPF POC PRESENT (A) NONE SEEN   WBC, Wet Prep HPF POC MANY (A) NONE SEEN    Comment: MANY BACTERIA SEEN   Sperm NONE SEEN   Rh IG  workup (includes ABO/Rh)     Status: None (Preliminary result)   Collection Time: 03/16/15  9:55 PM  Result Value Ref Range   Gestational Age(Wks) 26    ABO/RH(D) O NEG    Antibody Screen NEG    Fetal Screen NEG    Unit Number [id number]    Blood Component Type RHIG    Unit division 00    Status of Unit ISSUED    Transfusion Status OK TO TRANSFUSE     Review of Systems  Constitutional: Negative for fever and chills.  Gastrointestinal: Positive for abdominal pain. Negative for nausea and vomiting.  Genitourinary: Positive for urgency and frequency. Negative for dysuria and flank pain.  Musculoskeletal: Positive for back pain.   Physical Exam   Blood pressure 122/89, pulse 88, temperature 98.6 F (37 C), resp. rate 18, last menstrual period 09/09/2014.  Physical Exam  Constitutional: She is oriented to person, place, and time. She appears well-developed and well-nourished. No distress.  HENT:  Head: Normocephalic.  Eyes: Pupils are equal, round, and reactive to light.  Neck: Neck supple.  Respiratory: Effort normal. No respiratory distress.  GI: Soft. She exhibits no distension and no mass. There is no tenderness. There is no rebound, no guarding and no CVA tenderness.  Genitourinary:  Speculum exam: Vagina - Small amount of creamy, pink discharge, strong odor  Cervix - pink, friable. Active, scant amount of bright red blood noted when Qtip touched the cervix.  Bimanual exam: Cervix closed, posterior  GC/Chlam, wet prep done Chaperone present for exam.  Musculoskeletal: Normal range of motion.   Neurological: She is alert and oriented to person, place, and time.  Skin: Skin is warm. She is not diaphoretic.  Psychiatric: Her behavior is normal.     Fetal Tracing: Baseline: 145 bpm  Variability: moderate  Accelerations: 15x15 Decelerations: quick variables  Toco: quiet.   MAU Course  Procedures  MDM  PO hydration Urine culture pending  Wet prep GC  [place]  ABO> O negative blood type> rhogam administered.  3.6 cm cervical length on [place], normal fluid levels     Assessment and Plan   A:  1. Friable cervix   2. Vaginal bleeding in pregnancy, second trimester   3. BV (bacterial vaginosis)   4. UTI in pregnancy, antepartum, second trimester   5. Cough     P:  Discharge home in stable condition Pelvic rest Fetal kick counts RX: Flagyl, Keflex, Tessalon pearls  Return to MAU if symptoms worsen Follow up with Dr. Clearance Coots on Monday.   Duane Lope, NP 03/17/2015 11:38 AM

## 2015-03-17 LAB — RH IG WORKUP (INCLUDES ABO/RH)
ABO/RH(D): O NEG
ANTIBODY SCREEN: NEGATIVE
Fetal Screen: NEGATIVE
Gestational Age(Wks): 26
UNIT DIVISION: 0

## 2015-03-17 LAB — OB RESULTS CONSOLE GC/CHLAMYDIA
Chlamydia: NEGATIVE
Gonorrhea: NEGATIVE

## 2015-03-18 LAB — CULTURE, OB URINE: Special Requests: NORMAL

## 2015-03-19 LAB — GC/CHLAMYDIA PROBE AMP (~~LOC~~) NOT AT ARMC
Chlamydia: NEGATIVE
NEISSERIA GONORRHEA: NEGATIVE

## 2015-03-27 ENCOUNTER — Encounter: Payer: Medicaid Other | Admitting: Certified Nurse Midwife

## 2015-03-27 ENCOUNTER — Other Ambulatory Visit: Payer: Medicaid Other

## 2015-04-04 ENCOUNTER — Ambulatory Visit (INDEPENDENT_AMBULATORY_CARE_PROVIDER_SITE_OTHER): Payer: Medicaid Other | Admitting: Certified Nurse Midwife

## 2015-04-04 VITALS — BP 112/79 | HR 96 | Temp 98.6°F | Wt 288.0 lb

## 2015-04-04 DIAGNOSIS — Z3482 Encounter for supervision of other normal pregnancy, second trimester: Secondary | ICD-10-CM

## 2015-04-04 DIAGNOSIS — R042 Hemoptysis: Secondary | ICD-10-CM

## 2015-04-04 MED ORDER — OMEPRAZOLE 20 MG PO CPDR: 20.0000 mg | DELAYED_RELEASE_CAPSULE | Freq: Two times a day (BID) | ORAL | Status: DC

## 2015-04-04 MED ORDER — DOXYLAMINE-PYRIDOXINE 10-10 MG PO TBEC: DELAYED_RELEASE_TABLET | ORAL | Status: DC

## 2015-04-04 NOTE — Progress Notes (Signed)
Subjective:    Jacqueline Clarke is a 30 y.o. female being seen today for her obstetrical visit. She is at 6265w6d gestation. Patient reports no bleeding, no contractions, no cramping, no leaking and states that she has been stressed d/t traveling back and forth to [place] d/t her father being sick. Fetal movement: normal.  States that she has been coughing up blood since last Tuesday.  Denies any fever, recent URI, and no one else is sick around her.  States that it started about once a day.  States that she has burning sensation before she coughs up blood.  States this has never happened to her before.  Does state that she has night sweats.    Problem List Items Addressed This Visit    None    Visit Diagnoses    Supervision of other normal pregnancy, antepartum, second trimester    -  Primary    Relevant Orders    POCT urinalysis dipstick    Hemoptysis        Relevant Orders    Quantiferon tb gold assay (blood)    CBC With Differential    Comprehensive metabolic panel      Patient Active Problem List   Diagnosis Date Noted  . Hyperemesis gravidarum before end of [redacted] week gestation, dehydration 11/06/2014  . Malnutrition of moderate degree 11/03/2014  . Depressive disorder 12/14/2012  . Normal delivery 07/24/2011   Objective:    BP 112/79 mmHg  Pulse 96  Temp(Src) 98.6 F (37 C)  Wt 288 lb (130.636 kg)  LMP 09/09/2014 FHT:  155 BPM  Uterine Size: unable to determine d/t body habitus  Presentation: cephalic     Assessment:    Pregnancy @ 4365w6d weeks   hemoptysis of unknown origin ?GERD  Plan:     labs reviewed, problem list updated Consent signed. GBS planning TDAP offered  Rhogam given for RH negative Pediatrician: discussed. Infant feeding: plans to breastfeed. Maternity leave: discussed. Cigarette smoking: never smoked. Orders Placed This Encounter  Procedures  . Quantiferon tb gold assay (blood)  . CBC With Differential  . Comprehensive metabolic panel  .  POCT urinalysis dipstick   Meds ordered this encounter  Medications  . Doxylamine-Pyridoxine (DICLEGIS) 10-10 MG TBEC    Sig: Take 1 tablet with breakfast and lunch.  Take 2 tablets at bedtime.    Dispense:  100 tablet    Refill:  4  . omeprazole (PRILOSEC) 20 MG capsule    Sig: Take 1 capsule (20 mg total) by mouth 2 (two) times daily before a meal.    Dispense:  60 capsule    Refill:  5   Follow up in 2 Weeks.  If coughing is not improved, then she will need a pulmonology consult.

## 2015-04-05 ENCOUNTER — Other Ambulatory Visit: Payer: Medicaid Other

## 2015-04-05 DIAGNOSIS — Z3483 Encounter for supervision of other normal pregnancy, third trimester: Secondary | ICD-10-CM

## 2015-04-05 LAB — COMPREHENSIVE METABOLIC PANEL
A/G RATIO: 1.1 — AB (ref 1.2–2.2)
ALK PHOS: 100 IU/L (ref 39–117)
ALT: 6 IU/L (ref 0–32)
AST: 11 IU/L (ref 0–40)
Albumin: 3.5 g/dL (ref 3.5–5.5)
BUN/Creatinine Ratio: 14 (ref 8–20)
BUN: 8 mg/dL (ref 6–20)
Bilirubin Total: <0.2 mg/dL (ref 0.0–1.2)
CALCIUM: 8.9 mg/dL (ref 8.7–10.2)
CO2: 22 mmol/L (ref 18–29)
Chloride: 101 mmol/L (ref 96–106)
Creatinine, Ser: 0.56 mg/dL — ABNORMAL LOW (ref 0.57–1.00)
GFR calc Af Amer: 147 mL/min/{1.73_m2} (ref >59)
GFR, EST NON AFRICAN AMERICAN: 127 mL/min/{1.73_m2} (ref >59)
Globulin, Total: 3.2 g/dL (ref 1.5–4.5)
Glucose: 80 mg/dL (ref 65–99)
POTASSIUM: 4.1 mmol/L (ref 3.5–5.2)
Sodium: 138 mmol/L (ref 134–144)
Total Protein: 6.7 g/dL (ref 6.0–8.5)

## 2015-04-05 LAB — CBC WITH DIFFERENTIAL
Basophils Absolute: 0 10*3/uL (ref 0.0–0.2)
Basos: 0 %
EOS (ABSOLUTE): 0.1 10*3/uL (ref 0.0–0.4)
Eos: 1 %
Hematocrit: 31.2 % — ABNORMAL LOW (ref 34.0–46.6)
Hemoglobin: 9.9 g/dL — ABNORMAL LOW (ref 11.1–15.9)
IMMATURE GRANULOCYTES: 0 %
Immature Grans (Abs): 0 10*3/uL (ref 0.0–0.1)
Lymphocytes Absolute: 1.5 10*3/uL (ref 0.7–3.1)
Lymphs: 19 %
MCH: 26.2 pg — ABNORMAL LOW (ref 26.6–33.0)
MCHC: 31.7 g/dL (ref 31.5–35.7)
MCV: 83 fL (ref 79–97)
MONOS ABS: 0.5 10*3/uL (ref 0.1–0.9)
Monocytes: 6 %
NEUTROS PCT: 74 %
Neutrophils Absolute: 5.8 10*3/uL (ref 1.4–7.0)
RBC: 3.78 x10E6/uL (ref 3.77–5.28)
RDW: 15.3 % (ref 12.3–15.4)
WBC: 7.9 10*3/uL (ref 3.4–10.8)

## 2015-04-06 ENCOUNTER — Other Ambulatory Visit: Payer: Self-pay | Admitting: Certified Nurse Midwife

## 2015-04-06 DIAGNOSIS — O99013 Anemia complicating pregnancy, third trimester: Secondary | ICD-10-CM

## 2015-04-06 LAB — HIV ANTIBODY (ROUTINE TESTING W REFLEX): HIV Screen 4th Generation wRfx: NONREACTIVE

## 2015-04-06 LAB — CBC
HEMATOCRIT: 30.3 % — AB (ref 34.0–46.6)
Hemoglobin: 9.3 g/dL — ABNORMAL LOW (ref 11.1–15.9)
MCH: 25.5 pg — ABNORMAL LOW (ref 26.6–33.0)
MCHC: 30.7 g/dL — AB (ref 31.5–35.7)
MCV: 83 fL (ref 79–97)
Platelets: 244 10*3/uL (ref 150–379)
RBC: 3.65 x10E6/uL — ABNORMAL LOW (ref 3.77–5.28)
RDW: 15.1 % (ref 12.3–15.4)
WBC: 8 10*3/uL (ref 3.4–10.8)

## 2015-04-06 LAB — RPR: RPR: NONREACTIVE

## 2015-04-06 LAB — GLUCOSE TOLERANCE, 2 HOURS W/ 1HR
GLUCOSE, 1 HOUR: 120 mg/dL (ref 65–179)
GLUCOSE, 2 HOUR: 98 mg/dL (ref 65–152)
GLUCOSE, FASTING: 89 mg/dL (ref 65–91)

## 2015-04-06 MED ORDER — VITAFOL FE+ 90-1-200 & 50 MG PO CPPK: 2.0000 | ORAL_CAPSULE | Freq: Every day | ORAL | Status: DC

## 2015-04-07 LAB — QUANTIFERON IN TUBE
QFT TB AG MINUS NIL VALUE: 0.01 IU/mL
QUANTIFERON MITOGEN VALUE: >10 IU/mL
QUANTIFERON TB AG VALUE: 0.08 [IU]/mL
QUANTIFERON TB GOLD: NEGATIVE
Quantiferon Nil Value: 0.07 IU/mL

## 2015-04-07 LAB — QUANTIFERON TB GOLD ASSAY (BLOOD)

## 2015-04-16 ENCOUNTER — Other Ambulatory Visit: Payer: Self-pay | Admitting: Certified Nurse Midwife

## 2015-04-18 ENCOUNTER — Ambulatory Visit (INDEPENDENT_AMBULATORY_CARE_PROVIDER_SITE_OTHER): Payer: Medicaid Other | Admitting: Certified Nurse Midwife

## 2015-04-18 VITALS — BP 108/75 | HR 94 | Temp 98.4°F | Wt 289.0 lb

## 2015-04-18 DIAGNOSIS — Z3483 Encounter for supervision of other normal pregnancy, third trimester: Secondary | ICD-10-CM

## 2015-04-18 NOTE — Progress Notes (Signed)
Subjective:    Jacqueline Clarke is a 30 y.o. female being seen today for her obstetrical visit. She is at 6844w6d gestation. Patient reports no complaints. Fetal movement: normal.  Problem List Items Addressed This Visit    None    Visit Diagnoses    Supervision of other normal pregnancy, antepartum, third trimester    -  Primary    Relevant Orders    POCT urinalysis dipstick      Patient Active Problem List   Diagnosis Date Noted  . Hyperemesis gravidarum before end of [redacted] week gestation, dehydration 11/06/2014  . Malnutrition of moderate degree 11/03/2014  . Depressive disorder 12/14/2012  . Normal delivery 07/24/2011   Objective:    BP 108/75 mmHg  Pulse 94  Temp(Src) 98.4 F (36.9 C)  Wt 289 lb (131.09 kg)  LMP 09/09/2014 FHT:  145 BPM  Uterine Size: unable to determine d/t body habitus  Presentation: cephalic     Assessment:    Pregnancy @ 5844w6d weeks   Doing well  Plan:     labs reviewed, problem list updated Consent signed. GBS planning TDAP offered  Rhogam given for RH negative Pediatrician: discussed. Infant feeding: plans to breastfeed. Maternity leave: discussed. Cigarette smoking: never smoked. Orders Placed This Encounter  Procedures  . POCT urinalysis dipstick   Meds ordered this encounter  Medications  . QUEtiapine (SEROQUEL) 100 MG tablet    Sig: Take 100 mg by mouth at bedtime.   Follow up in 2 Weeks.

## 2015-04-26 ENCOUNTER — Encounter: Payer: Medicaid Other | Admitting: Certified Nurse Midwife

## 2015-05-07 ENCOUNTER — Inpatient Hospital Stay (HOSPITAL_COMMUNITY)
Admission: AD | Admit: 2015-05-07 | Discharge: 2015-05-08 | Disposition: A | Discharge status: Home / Self Care | Payer: Medicaid Other | Source: Ambulatory Visit | Attending: Obstetrics | Admitting: Obstetrics

## 2015-05-07 ENCOUNTER — Encounter (HOSPITAL_COMMUNITY): Payer: Self-pay | Admitting: *Deleted

## 2015-05-07 DIAGNOSIS — Z87891 Personal history of nicotine dependence: Secondary | ICD-10-CM | POA: Insufficient documentation

## 2015-05-07 DIAGNOSIS — O368130 Decreased fetal movements, third trimester, not applicable or unspecified: Secondary | ICD-10-CM | POA: Insufficient documentation

## 2015-05-07 DIAGNOSIS — Z3689 Encounter for other specified antenatal screening: Secondary | ICD-10-CM

## 2015-05-07 DIAGNOSIS — F329 Major depressive disorder, single episode, unspecified: Secondary | ICD-10-CM | POA: Diagnosis not present

## 2015-05-07 DIAGNOSIS — F419 Anxiety disorder, unspecified: Secondary | ICD-10-CM | POA: Diagnosis not present

## 2015-05-07 DIAGNOSIS — J45909 Unspecified asthma, uncomplicated: Secondary | ICD-10-CM | POA: Insufficient documentation

## 2015-05-07 DIAGNOSIS — Z79899 Other long term (current) drug therapy: Secondary | ICD-10-CM | POA: Diagnosis not present

## 2015-05-07 DIAGNOSIS — Z3A33 33 weeks gestation of pregnancy: Secondary | ICD-10-CM | POA: Insufficient documentation

## 2015-05-07 DIAGNOSIS — L299 Pruritus, unspecified: Secondary | ICD-10-CM | POA: Diagnosis not present

## 2015-05-07 DIAGNOSIS — M549 Dorsalgia, unspecified: Secondary | ICD-10-CM | POA: Insufficient documentation

## 2015-05-07 DIAGNOSIS — G8929 Other chronic pain: Secondary | ICD-10-CM | POA: Insufficient documentation

## 2015-05-07 DIAGNOSIS — O9989 Other specified diseases and conditions complicating pregnancy, childbirth and the puerperium: Secondary | ICD-10-CM | POA: Diagnosis not present

## 2015-05-07 DIAGNOSIS — M419 Scoliosis, unspecified: Secondary | ICD-10-CM | POA: Diagnosis not present

## 2015-05-07 LAB — CBC
HEMATOCRIT: 27.8 % — AB (ref 36.0–46.0)
Hemoglobin: 8.9 g/dL — ABNORMAL LOW (ref 12.0–15.0)
MCH: 26.2 pg (ref 26.0–34.0)
MCHC: 32 g/dL (ref 30.0–36.0)
MCV: 81.8 fL (ref 78.0–100.0)
Platelets: 227 10*3/uL (ref 150–400)
RBC: 3.4 MIL/uL — ABNORMAL LOW (ref 3.87–5.11)
RDW: 14.9 % (ref 11.5–15.5)
WBC: 9.8 10*3/uL (ref 4.0–10.5)

## 2015-05-07 LAB — URINALYSIS, ROUTINE W REFLEX MICROSCOPIC
Bilirubin Urine: NEGATIVE
Glucose, UA: NEGATIVE mg/dL
Hgb urine dipstick: NEGATIVE
KETONES UR: NEGATIVE mg/dL
LEUKOCYTES UA: NEGATIVE
NITRITE: NEGATIVE
PROTEIN: NEGATIVE mg/dL
Specific Gravity, Urine: >1.03 — ABNORMAL HIGH (ref 1.005–1.030)
pH: 6 (ref 5.0–8.0)

## 2015-05-07 LAB — COMPREHENSIVE METABOLIC PANEL
ALT: 12 U/L — ABNORMAL LOW (ref 14–54)
ANION GAP: 6 (ref 5–15)
AST: 19 U/L (ref 15–41)
Albumin: 2.8 g/dL — ABNORMAL LOW (ref 3.5–5.0)
Alkaline Phosphatase: 94 U/L (ref 38–126)
BILIRUBIN TOTAL: 0.4 mg/dL (ref 0.3–1.2)
BUN: 13 mg/dL (ref 6–20)
CALCIUM: 8.9 mg/dL (ref 8.9–10.3)
CO2: 23 mmol/L (ref 22–32)
Chloride: 108 mmol/L (ref 101–111)
Creatinine, Ser: 0.75 mg/dL (ref 0.44–1.00)
GFR calc Af Amer: >60 mL/min (ref >60)
GFR calc non Af Amer: >60 mL/min (ref >60)
Glucose, Bld: 89 mg/dL (ref 65–99)
POTASSIUM: 3.6 mmol/L (ref 3.5–5.1)
Sodium: 137 mmol/L (ref 135–145)
TOTAL PROTEIN: 7.5 g/dL (ref 6.5–8.1)

## 2015-05-07 MED ORDER — HYDROXYZINE HCL 25 MG PO TABS
25.0000 mg | ORAL_TABLET | Freq: Once | ORAL | Status: AC
  Administered 2015-05-07: 25 mg via ORAL
  Filled 2015-05-07: qty 1

## 2015-05-07 MED ORDER — HYDROXYZINE HCL 50 MG PO TABS: 50.0000 mg | ORAL_TABLET | Freq: Once | ORAL | Status: DC

## 2015-05-07 NOTE — MAU Note (Signed)
PT  SAYS  SHE HAS HAD DECREASE FETAL MOVEMENT    SINCE  3PM YESTERDAY.        HAS ITCHING ON ARMS  AND CHEST, LEGS, FEET, PSALMS,-   STARTED ON Thursday.-    HAS TRIED BENADRYL,   OTC MEDS.      Marland Kitchen.    FEELS  CRAMPS  AND  PRESSURE.      NO VE IN OFFICE      LAST SEX-  WED

## 2015-05-07 NOTE — MAU Note (Signed)
Pt. Here for decreased fetal movement since yesterday. Feels pressure and cramps in her pelvis. Feels that her underwear are wet at times. Denies large gushes of fluid. Denies bleeding. Pt. Notes she started to have itching last Thursday on arms, chest, legs, feet and palms. Has tried OTC meds Benadryl and cortisone creams and these do not help. Here for evaluation. Next appointment is tomorrow.

## 2015-05-07 NOTE — MAU Provider Note (Signed)
History     CSN: [id number]  Arrival date and time: 05/07/15 2203   First Provider Initiated Contact with Patient 05/07/15 2240      Chief Complaint  Patient presents with  . Decreased Fetal Movement   HPI  [name] is a 30 y.o. [id number] at [redacted]w[redacted]d who presents to MAU today with complaint of decreased fetal movement and diffuse itching. The patient states less movement since yesterday afternoon. She is still feeling some movement, but less than normal. She denies abdominal pain, but states significant lower abdominal pressures with occasional cramping. She denies contractions. She rates the pressure at 8/10 now. She has not taken anything for pain. She notes worsening pressure with movement and ambulation. She denies vaginal bleeding or LOF. She also states new onset diffuse itching since Thursday. She denies rash. She has had gallbladder removed. She has tried Benadryl and Hydrocortisone cream without relief. Last dose at 1900 today. She denies complications with this pregnancy except for hyperemesis in the first trimester.    OB History    Gravida Para Term Preterm AB TAB SAB Ectopic Multiple Living   0 2 0 2 0  3      Past Medical History  Diagnosis Date  . Asthma   . Scoliosis   . Blood type, Rh negative   . Anemia   . Chronic back pain     scolosis- on percocet  . Hyperemesis complicating pregnancy, antepartum 2013  . Headache(784.0)   . Cholelithiasis   . Pregnancy induced hypertension   . 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 Obesity   . Depression   . Anxiety     Past Surgical History  Procedure Laterality Date  . Adenoidectomy    . Nerve, tendon and artery repair Right 06/18/2014    Procedure: NERVE, TENDON AND ARTERY REPAIR;  Surgeon: Bradly Bienenstock, MD;  Location: [facility] OR;  Service: Orthopedics;  Laterality: Right;  . Cholecystectomy    . Adenoidectomy    . Dilation and curettage of uterus  2008  . Arm surgery      Family  History  Problem Relation Age of Onset  . Heart disease Maternal Grandfather   . Diabetes Maternal Grandfather   . Anesthesia problems Neg Hx   . Heart disease Father     Social History  Substance Use Topics  . Smoking status: Former Smoker -- 0.50 packs/day    Types: Cigarettes    Quit date: 11/17/2010  . Smokeless tobacco: Never Used  . Alcohol Use: No     Comment: Occassionally    Allergies: No Known Allergies  Prescriptions prior to admission  Medication Sig Dispense Refill Last Dose  . benzonatate (TESSALON) 100 MG capsule Take 1 capsule (100 mg total) by mouth every 8 (eight) hours. 21 capsule 0 Past Month at Unknown time  . butalbital-acetaminophen-caffeine (FIORICET) 50-325-40 MG tablet Take 1-2 tablets by mouth every 6 (six) hours as needed for headache. 45 tablet 4 Past Week at Unknown time  . calcium carbonate (TUMS - DOSED IN MG ELEMENTAL CALCIUM) 500 MG chewable tablet Chew 2 tablets (400 mg of elemental calcium total) by mouth 3 (three) times daily with meals. 120 tablet 6 05/06/2015 at Unknown time  . diphenhydrAMINE (BENADRYL) 25 MG tablet Take 25 mg by mouth every 6 (six) hours as needed (takes 2).   05/07/2015 at 1900  . Doxylamine-Pyridoxine (DICLEGIS) 10-10 MG TBEC Take 1 tablet with breakfast  and lunch.  Take 2 tablets at bedtime. 100 tablet 4 05/07/2015 at Unknown time  . hydrocortisone 2.5 % cream Apply topically 2 (two) times daily.   05/07/2015 at Unknown time  . omeprazole (PRILOSEC) 20 MG capsule Take 1 capsule (20 mg total) by mouth 2 (two) times daily before a meal. 60 capsule 5 Past Week at Unknown time  . QUEtiapine (SEROQUEL) 100 MG tablet Take 100 mg by mouth at bedtime.   Past Week at Unknown time  . Prenat-FeCbn-FeAspGl-FA-Omega (OB COMPLETE PETITE) 35-5-1-200 MG CAPS Take 1 capsule by mouth daily before breakfast. (Patient not taking: Reported on 03/16/2015) 90 capsule 3 Not Taking  . Prenat-FePoly-Metf-FA-DHA-DSS (VITAFOL FE+) 90-1-200 & 50 MG CPPK Take  2 tablets by mouth daily. (Patient not taking: Reported on 04/18/2015) 60 each 12 Not Taking    Review of Systems  Constitutional: Negative for fever and malaise/fatigue.  Gastrointestinal: Positive for abdominal pain.  Genitourinary: Negative for dysuria, urgency and frequency.       Neg - vaginal bleeding, discharge ? LOF  Skin: Positive for itching. Negative for rash.   Physical Exam   Blood pressure 115/65, pulse 110, temperature 98.9 F (37.2 C), temperature source Oral, resp. rate 20, height 5\' 7"  (1.702 m), weight 295 lb (133.811 kg), last menstrual period 09/09/2014.  Physical Exam  Nursing note and vitals reviewed. Constitutional: She is oriented to person, place, and time. She appears well-developed and well-nourished. No distress.  HENT:  Head: Normocephalic and atraumatic.  Cardiovascular: Normal rate.   Respiratory: Effort normal.  GI: Soft. She exhibits no distension and no mass. There is no tenderness. There is no rebound and no guarding.  Neurological: She is alert and oriented to person, place, and time.  Skin: Skin is warm and dry. No rash noted. No erythema.  Psychiatric: She has a normal mood and affect.  Dilation: Closed Exam by:: Vonzella NippleJulie Rubens Cranston, PA   Results for orders placed or performed during the hospital encounter of 05/07/15 (from the past 24 hour(s))  Urinalysis, Routine w reflex microscopic (not [facility])     Status: Abnormal   Collection Time: 05/07/15 10:20 PM  Result Value Ref Range   Color, Urine YELLOW YELLOW   APPearance CLEAR CLEAR   Specific Gravity, Urine >1.030 (H) 1.005 - 1.030   pH 6.0 5.0 - 8.0   Glucose, UA NEGATIVE NEGATIVE mg/dL   Hgb urine dipstick NEGATIVE NEGATIVE   Bilirubin Urine NEGATIVE NEGATIVE   Ketones, ur NEGATIVE NEGATIVE mg/dL   Protein, ur NEGATIVE NEGATIVE mg/dL   Nitrite NEGATIVE NEGATIVE   Leukocytes, UA NEGATIVE NEGATIVE  CBC     Status: Abnormal   Collection Time: 05/07/15 11:00 PM  Result Value Ref Range    WBC 9.8 4.0 - 10.5 K/uL   RBC 3.40 (L) 3.87 - 5.11 MIL/uL   Hemoglobin 8.9 (L) 12.0 - 15.0 g/dL   HCT 78.227.8 (L) 95.636.0 - 21.346.0 %   MCV 81.8 78.0 - 100.0 fL   MCH 26.2 26.0 - 34.0 pg   MCHC 32.0 30.0 - 36.0 g/dL   RDW 08.614.9 57.811.5 - 46.915.5 %   Platelets 227 150 - 400 K/uL  Comprehensive metabolic panel     Status: Abnormal   Collection Time: 05/07/15 11:00 PM  Result Value Ref Range   Sodium 137 135 - 145 mmol/L   Potassium 3.6 3.5 - 5.1 mmol/L   Chloride 108 101 - 111 mmol/L   CO2 23 22 - 32 mmol/L   Glucose, Bld  89 65 - 99 mg/dL   BUN 13 6 - 20 mg/dL   Creatinine, Ser 1.61 0.44 - 1.00 mg/dL   Calcium 8.9 8.9 - 09.6 mg/dL   Total Protein 7.5 6.5 - 8.1 g/dL   Albumin 2.8 (L) 3.5 - 5.0 g/dL   AST 19 15 - 41 U/L   ALT 12 (L) 14 - 54 U/L   Alkaline Phosphatase 94 38 - 126 U/L   Total Bilirubin 0.4 0.3 - 1.2 mg/dL   GFR calc non Af Amer >60 >60 mL/min   GFR calc Af Amer >60 >60 mL/min   Anion gap 6 5 - 15    Fetal Monitoring: Baseline: 135 bpm Variability: moderate Accelerations: 15 x 15 Decelerations: none Contractions: none  MAU Course  Procedures None  MDM UA today FFN collected prior to SVE. Cervix closed, FFN discarded. Patient is not having contractions, just pressure.  CBC, CMP today. Bile acids not obtained since patient has had cholecystectomy.  Fern slide - negative 25 mg Vistaril given  Patient reassured of normal fetal movement prior to discharge. NST reactive.  Patient reports mild improvement in itching only. Will prescribe additional Vistaril for home use tonight. Patient has follow-up with the office in the morning.  Assessment and Plan  A: SIUP at [redacted]w[redacted]d Reactive NST Pruritis  P: Discharge home Rx for Vistaril given to patient  Warning signs for worsening condition discussed Patient advised to follow-up with [facility] as scheduled tomorrow for routine prenatal care and follow-up for itching Patient may return to MAU as needed or if her condition were to  change or worsen  Marny Lowenstein, PA-C  05/08/2015, 12:04 AM

## 2015-05-08 ENCOUNTER — Other Ambulatory Visit: Payer: Self-pay | Admitting: Obstetrics

## 2015-05-08 ENCOUNTER — Encounter: Payer: Self-pay | Admitting: *Deleted

## 2015-05-08 ENCOUNTER — Ambulatory Visit (INDEPENDENT_AMBULATORY_CARE_PROVIDER_SITE_OTHER): Payer: Medicaid Other | Admitting: Certified Nurse Midwife

## 2015-05-08 VITALS — BP 103/70 | HR 85 | Temp 98.8°F | Wt 292.0 lb

## 2015-05-08 DIAGNOSIS — L299 Pruritus, unspecified: Secondary | ICD-10-CM | POA: Diagnosis not present

## 2015-05-08 DIAGNOSIS — O2686 Pruritic urticarial papules and plaques of pregnancy (PUPPP): Secondary | ICD-10-CM

## 2015-05-08 DIAGNOSIS — O9989 Other specified diseases and conditions complicating pregnancy, childbirth and the puerperium: Secondary | ICD-10-CM

## 2015-05-08 DIAGNOSIS — Z3493 Encounter for supervision of normal pregnancy, unspecified, third trimester: Secondary | ICD-10-CM

## 2015-05-08 LAB — POCT URINALYSIS DIPSTICK
Bilirubin, UA: NEGATIVE
Glucose, UA: NEGATIVE
KETONES UA: NEGATIVE
Nitrite, UA: NEGATIVE
PROTEIN UA: NEGATIVE
RBC UA: NEGATIVE
UROBILINOGEN UA: NEGATIVE
pH, UA: 8

## 2015-05-08 MED ORDER — HYDROXYZINE HCL 50 MG PO TABS: 50.0000 mg | ORAL_TABLET | Freq: Three times a day (TID) | ORAL | Status: DC | PRN

## 2015-05-08 MED ORDER — DIPHENHYDRAMINE HCL 25 MG PO TABS
50.0000 mg | ORAL_TABLET | Freq: Every day | ORAL | Status: DC
Start: 2015-05-08 — End: 2015-06-12

## 2015-05-08 MED ORDER — HYDROCORTISONE 2.5 % EX OINT: TOPICAL_OINTMENT | Freq: Two times a day (BID) | CUTANEOUS | Status: DC

## 2015-05-08 NOTE — Progress Notes (Signed)
Patient has started itching since Thursday evening. She was at the hospital last night- they started the Vistaril. Patient states it is not helping.

## 2015-05-08 NOTE — Discharge Instructions (Signed)
Pruritus °Pruritus is an itching feeling. There are many different conditions and factors that can make your skin itchy. Dry skin is one of the most common causes of itching. Most cases of itching do not require medical attention. Itchy skin can turn into a rash.  °HOME CARE INSTRUCTIONS  °Watch your pruritus for any changes. Take these steps to help with your condition:  °Skin Care °· Moisturize your skin as needed. A moisturizer that contains petroleum jelly is best for keeping moisture in your skin. °· Take or apply medicines only as directed by your health care provider. This may include: °¨ Corticosteroid cream. °¨ Anti-itch lotions. °¨ Oral anti-histamines. °· Apply cool compresses to the affected areas. °· Try taking a bath with: °¨ Epsom salts. Follow the instructions on the packaging. You can get these at your local pharmacy or grocery store. °¨ Baking soda. Pour a small amount into the bath as directed by your health care provider. °¨ Colloidal oatmeal. Follow the instructions on the packaging. You can get this at your local pharmacy or grocery store. °· Try applying baking soda paste to your skin. Stir water into baking soda until it reaches a paste-like consistency.   °· Do not scratch your skin. °· Avoid hot showers or baths, which can make itching worse. A cold shower may help with itching as long as you use a moisturizer after. °· Avoid scented soaps, detergents, and perfumes. Use gentle soaps, detergents, perfumes, and other cosmetic products. °General Instructions °· Avoid wearing tight clothes. °· Keep a journal to help track what causes your itch. Write down: °¨ What you eat. °¨ What cosmetic products you use. °¨ What you drink. °¨ What you wear. This includes jewelry. °· Use a humidifier. This keeps the air moist, which helps to prevent dry skin. °SEEK MEDICAL CARE IF: °· The itching does not go away after several days. °· You sweat at night. °· You have weight loss. °· You are unusually  thirsty. °· You urinate more than normal. °· You are more tired than normal. °· You have abdominal pain. °· Your skin tingles. °· You feel weak. °· Your skin or the whites of your eyes look yellow (jaundice). °· Your skin feels numb. °  °This information is not intended to replace advice given to you by your health care provider. Make sure you discuss any questions you have with your health care provider. °  °Document Released: 09/18/2010 Document Revised: 05/23/2014 Document Reviewed: 01/02/2014 °Elsevier Interactive Patient Education ©2016 [employer] ° °

## 2015-05-08 NOTE — Progress Notes (Signed)
Subjective:    Jacqueline Clarke is a 30 y.o. female being seen today for her obstetrical visit. She is at 8342w5d gestation. Patient reports no bleeding, no cramping, no leaking, occasional contractions and itching, mostly on abdomen, hands and feet.  Started over the weekend has tried vistaril and that is not helping. Fetal movement: normal.  Problem List Items Addressed This Visit    None    Visit Diagnoses    PUPP (pruritic urticarial papules and plaques of pregnancy)    -  Primary    Relevant Medications    diphenhydrAMINE (BENADRYL) 25 MG tablet    hydrocortisone 2.5 % ointment    Other Relevant Orders    Hepatic function panel    Bile acids, total    Prenatal care, third trimester        Relevant Orders    POCT urinalysis dipstick (Completed)      Patient Active Problem List   Diagnosis Date Noted  . Hyperemesis gravidarum before end of [redacted] week gestation, dehydration 11/06/2014  . Malnutrition of moderate degree 11/03/2014  . Depressive disorder 12/14/2012  . Normal delivery 07/24/2011   Objective:    BP 103/70 mmHg  Pulse 85  Temp(Src) 98.8 F (37.1 C)  Wt 292 lb (132.45 kg)  LMP 09/09/2014 FHT:  135 BPM  Uterine Size: unable to determine d/t body habitus  Presentation: cephalic     Assessment:    Pregnancy @ 3942w5d weeks   PUPP   Plan:     labs reviewed, problem list updated Consent signed. GBS planning TDAP offered  Rhogam given for RH negative Pediatrician: discussed. Infant feeding: plans to breastfeed. Maternity leave: discussed. Cigarette smoking: never smoked. Orders Placed This Encounter  Procedures  . Hepatic function panel  . Bile acids, total  . POCT urinalysis dipstick   Meds ordered this encounter  Medications  . diphenhydrAMINE (BENADRYL) 25 MG tablet    Sig: Take 2 tablets (50 mg total) by mouth at bedtime.    Dispense:  90 tablet    Refill:  4  . hydrocortisone 2.5 % ointment    Sig: Apply topically 2 (two) times daily.    Dispense:  30 g    Refill:  0   Follow up in 2 Weeks.

## 2015-05-09 LAB — BILE ACIDS, TOTAL: Bile Acids Total: 4.8 umol/L (ref 4.7–24.5)

## 2015-05-09 LAB — HEPATIC FUNCTION PANEL
ALBUMIN: 3.5 g/dL (ref 3.5–5.5)
ALK PHOS: 104 IU/L (ref 39–117)
ALT: 8 IU/L (ref 0–32)
AST: 17 IU/L (ref 0–40)
BILIRUBIN, DIRECT: 0.09 mg/dL (ref 0.00–0.40)
Bilirubin Total: 0.3 mg/dL (ref 0.0–1.2)
TOTAL PROTEIN: 6.8 g/dL (ref 6.0–8.5)

## 2015-05-16 ENCOUNTER — Observation Stay (HOSPITAL_COMMUNITY)
Admission: AD | Admit: 2015-05-16 | Discharge: 2015-05-17 | Disposition: A | Discharge status: Home / Self Care | Payer: Medicaid Other | Source: Ambulatory Visit | Attending: Obstetrics | Admitting: Obstetrics

## 2015-05-16 ENCOUNTER — Encounter (HOSPITAL_COMMUNITY): Payer: Self-pay | Admitting: *Deleted

## 2015-05-16 ENCOUNTER — Inpatient Hospital Stay (HOSPITAL_COMMUNITY): Payer: Medicaid Other

## 2015-05-16 DIAGNOSIS — Z8619 Personal history of other infectious and parasitic diseases: Secondary | ICD-10-CM | POA: Diagnosis not present

## 2015-05-16 DIAGNOSIS — J45909 Unspecified asthma, uncomplicated: Secondary | ICD-10-CM | POA: Diagnosis not present

## 2015-05-16 DIAGNOSIS — O99213 Obesity complicating pregnancy, third trimester: Secondary | ICD-10-CM | POA: Diagnosis not present

## 2015-05-16 DIAGNOSIS — Z3A35 35 weeks gestation of pregnancy: Secondary | ICD-10-CM | POA: Diagnosis not present

## 2015-05-16 DIAGNOSIS — O99513 Diseases of the respiratory system complicating pregnancy, third trimester: Secondary | ICD-10-CM | POA: Insufficient documentation

## 2015-05-16 DIAGNOSIS — Y9389 Activity, other specified: Secondary | ICD-10-CM | POA: Diagnosis not present

## 2015-05-16 DIAGNOSIS — S301XXA Contusion of abdominal wall, initial encounter: Secondary | ICD-10-CM | POA: Insufficient documentation

## 2015-05-16 DIAGNOSIS — M419 Scoliosis, unspecified: Secondary | ICD-10-CM | POA: Diagnosis not present

## 2015-05-16 DIAGNOSIS — F329 Major depressive disorder, single episode, unspecified: Secondary | ICD-10-CM | POA: Insufficient documentation

## 2015-05-16 DIAGNOSIS — E669 Obesity, unspecified: Secondary | ICD-10-CM | POA: Insufficient documentation

## 2015-05-16 DIAGNOSIS — Y92009 Unspecified place in unspecified non-institutional (private) residence as the place of occurrence of the external cause: Secondary | ICD-10-CM | POA: Insufficient documentation

## 2015-05-16 DIAGNOSIS — O9989 Other specified diseases and conditions complicating pregnancy, childbirth and the puerperium: Secondary | ICD-10-CM | POA: Insufficient documentation

## 2015-05-16 DIAGNOSIS — Y998 Other external cause status: Secondary | ICD-10-CM | POA: Diagnosis not present

## 2015-05-16 DIAGNOSIS — Z3A34 34 weeks gestation of pregnancy: Secondary | ICD-10-CM

## 2015-05-16 DIAGNOSIS — Z87891 Personal history of nicotine dependence: Secondary | ICD-10-CM | POA: Insufficient documentation

## 2015-05-16 DIAGNOSIS — O99343 Other mental disorders complicating pregnancy, third trimester: Secondary | ICD-10-CM | POA: Insufficient documentation

## 2015-05-16 DIAGNOSIS — Z862 Personal history of diseases of the blood and blood-forming organs and certain disorders involving the immune mechanism: Secondary | ICD-10-CM | POA: Insufficient documentation

## 2015-05-16 DIAGNOSIS — W1839XA Other fall on same level, initial encounter: Secondary | ICD-10-CM | POA: Insufficient documentation

## 2015-05-16 DIAGNOSIS — F419 Anxiety disorder, unspecified: Secondary | ICD-10-CM | POA: Insufficient documentation

## 2015-05-16 DIAGNOSIS — G8929 Other chronic pain: Secondary | ICD-10-CM | POA: Insufficient documentation

## 2015-05-16 DIAGNOSIS — S3991XA Unspecified injury of abdomen, initial encounter: Secondary | ICD-10-CM

## 2015-05-16 DIAGNOSIS — O9A213 Injury, poisoning and certain other consequences of external causes complicating pregnancy, third trimester: Principal | ICD-10-CM | POA: Insufficient documentation

## 2015-05-16 DIAGNOSIS — Z8719 Personal history of other diseases of the digestive system: Secondary | ICD-10-CM | POA: Insufficient documentation

## 2015-05-16 LAB — KLEIHAUER-BETKE STAIN
# VIALS RHIG: 1
Fetal Cells %: 0 %
QUANTITATION FETAL HEMOGLOBIN: 0 mL

## 2015-05-16 LAB — TYPE AND SCREEN
ABO/RH(D): O NEG
ANTIBODY SCREEN: NEGATIVE

## 2015-05-16 MED ORDER — OXYCODONE HCL 5 MG PO TABS
10.0000 mg | ORAL_TABLET | ORAL | Status: DC | PRN
  Administered 2015-05-16 – 2015-05-17 (×3): 10 mg via ORAL
  Filled 2015-05-16 (×4): qty 2

## 2015-05-16 MED ORDER — ZOLPIDEM TARTRATE 5 MG PO TABS: 5.0000 mg | ORAL_TABLET | Freq: Every evening | ORAL | Status: DC | PRN

## 2015-05-16 MED ORDER — DOCUSATE SODIUM 100 MG PO CAPS
100.0000 mg | ORAL_CAPSULE | Freq: Every day | ORAL | Status: DC
  Administered 2015-05-16 – 2015-05-17 (×2): 100 mg via ORAL
  Filled 2015-05-16 (×2): qty 1

## 2015-05-16 MED ORDER — CYCLOBENZAPRINE HCL 5 MG PO TABS
5.0000 mg | ORAL_TABLET | Freq: Once | ORAL | Status: AC
  Administered 2015-05-16: 5 mg via ORAL
  Filled 2015-05-16: qty 1

## 2015-05-16 MED ORDER — PRENATAL MULTIVITAMIN CH
1.0000 | ORAL_TABLET | Freq: Every day | ORAL | Status: DC
  Administered 2015-05-17: 1 via ORAL
  Filled 2015-05-16: qty 1

## 2015-05-16 MED ORDER — ACETAMINOPHEN 325 MG PO TABS
650.0000 mg | ORAL_TABLET | Freq: Once | ORAL | Status: AC
  Administered 2015-05-16: 650 mg via ORAL
  Filled 2015-05-16: qty 2

## 2015-05-16 MED ORDER — SODIUM CHLORIDE 0.9% FLUSH: 3.0000 mL | Freq: Two times a day (BID) | INTRAVENOUS | Status: DC

## 2015-05-16 MED ORDER — CALCIUM CARBONATE ANTACID 500 MG PO CHEW: 2.0000 | CHEWABLE_TABLET | ORAL | Status: DC | PRN

## 2015-05-16 NOTE — MAU Provider Note (Signed)
History     CSN: [id number]  Arrival date and time: 05/16/15 1200   First Provider Initiated Contact with Patient 05/16/15 1644       Chief Complaint  Patient presents with  . Fall  . Abdominal Pain   HPI Jacqueline Clarke is a 30 y.o. [id number] at 5386w6d who presents with abdominal pain s/p fall. Patient slipped and fell directly on her abdomen this morning. Slipped on water that was on her kitchen floor. Denies LOV. Reports lower abdominal cramping since then. Positive fetal movement. Denies LOF or vaginal bleeding.   OB History    Gravida Para Term Preterm AB TAB SAB Ectopic Multiple Living   6 3 3  0 2 0 2 0  3      Past Medical History  Diagnosis Date  . Asthma   . Scoliosis   . Blood type, Rh negative   . Anemia   . Chronic back pain     scoliosis- on percocet  . Hyperemesis complicating pregnancy, antepartum 2013  . Headache(784.0)   . Cholelithiasis   . Pregnancy induced hypertension   . 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 Obesity   . Depression   . Anxiety     Past Surgical History  Procedure Laterality Date  . Adenoidectomy    . Nerve, tendon and artery repair Right 06/18/2014    Procedure: NERVE, TENDON AND ARTERY REPAIR;  Surgeon: Bradly BienenstockFred Ortmann, MD;  Location: [facility] OR;  Service: Orthopedics;  Laterality: Right;  . Cholecystectomy    . Adenoidectomy    . Dilation and curettage of uterus  2008  . Arm surgery      Family History  Problem Relation Age of Onset  . Heart disease Maternal Grandfather   . Diabetes Maternal Grandfather   . Anesthesia problems Neg Hx   . Heart disease Father     Social History  Substance Use Topics  . Smoking status: Former Smoker -- 0.50 packs/day    Types: Cigarettes    Quit date: 11/17/2010  . Smokeless tobacco: Never Used  . Alcohol Use: No     Comment: Occassionally    Allergies: No Known Allergies  Prescriptions prior to admission  Medication Sig Dispense Refill Last Dose  . benzonatate  (TESSALON) 100 MG capsule Take 1 capsule (100 mg total) by mouth every 8 (eight) hours. (Patient not taking: Reported on 05/08/2015) 21 capsule 0 Not Taking  . butalbital-acetaminophen-caffeine (FIORICET) 50-325-40 MG tablet Take 1-2 tablets by mouth every 6 (six) hours as needed for headache. 45 tablet 4 Taking  . calcium carbonate (TUMS - DOSED IN MG ELEMENTAL CALCIUM) 500 MG chewable tablet Chew 2 tablets (400 mg of elemental calcium total) by mouth 3 (three) times daily with meals. 120 tablet 6 Taking  . diphenhydrAMINE (BENADRYL) 25 MG tablet Take 2 tablets (50 mg total) by mouth at bedtime. 90 tablet 4   . Doxylamine-Pyridoxine (DICLEGIS) 10-10 MG TBEC Take 1 tablet with breakfast and lunch.  Take 2 tablets at bedtime. 100 tablet 4 Taking  . hydrocortisone 2.5 % ointment Apply topically 2 (two) times daily. 30 g 0   . hydrOXYzine (ATARAX/VISTARIL) 50 MG tablet Take 1 tablet (50 mg total) by mouth 3 (three) times daily as needed. 30 tablet 0 Taking  . omeprazole (PRILOSEC) 20 MG capsule Take 1 capsule (20 mg total) by mouth 2 (two) times daily before a meal. 60 capsule 5 Taking  . oxyCODONE-acetaminophen (PERCOCET) 10-325 MG tablet  TK 1 T PO QD PRN  0   . Prenat-FePoly-Metf-FA-DHA-DSS (VITAFOL FE+) 90-1-200 & 50 MG CPPK Take 2 tablets by mouth daily. 60 each 12 Taking  . QUEtiapine (SEROQUEL) 100 MG tablet Take 100 mg by mouth at bedtime.   Taking    Review of Systems  Constitutional: Negative.   Gastrointestinal: Positive for abdominal pain. Negative for nausea, vomiting, diarrhea and constipation.  Genitourinary: Negative.   Musculoskeletal: Positive for falls. Negative for back pain.  Neurological: Negative for dizziness and loss of consciousness.   Physical Exam   Blood pressure 117/66, pulse 92, temperature 98.2 F (36.8 C), temperature source Oral, resp. rate 18, last menstrual period 09/09/2014.  Physical Exam  Nursing note and vitals reviewed. Constitutional: She is oriented to  person, place, and time. She appears well-developed and well-nourished. No distress.  HENT:  Head: Normocephalic and atraumatic.  Eyes: Conjunctivae are normal. Right eye exhibits no discharge. Left eye exhibits no discharge. No scleral icterus.  Neck: Normal range of motion.  Cardiovascular: Normal rate, regular rhythm and normal heart sounds.   No murmur heard. Respiratory: Effort normal and breath sounds normal. No respiratory distress. She has no wheezes.  GI: Soft. Bowel sounds are normal. There is no tenderness.  Neurological: She is alert and oriented to person, place, and time.  Skin: Skin is warm and dry. She is not diaphoretic.  Psychiatric: She has a normal mood and affect. Her behavior is normal. Judgment and thought content normal.   Dilation: Closed Effacement (%): Thick Exam by:: Estanislado Spire, NP  Fetal Tracing:  Baseline: 135 Variability: moderate Accelerations: 15x15 Decelerations: none  Toco: UI   MAU Course  Procedures Results for orders placed or performed during the hospital encounter of 05/16/15 (from the past 24 hour(s))  Kleihauer-Betke stain     Status: None   Collection Time: 05/16/15 12:40 PM  Result Value Ref Range   Fetal Cells % 0 %   Quantitation Fetal Hemoglobin 0 mL   # Vials RhIg 1      MDM Reactive tracing Ultrasound shows no evidence of abruption; anterior placenta Uterine irritability & occasional contractions Cervix closed O negative - KB negative Flexeril  & tylenol 650 mg for lower abdominal pain Pt reports that pain has worsened since being here and is worse with movement. Pain is in lower abdomen and wraps up to right side of abdomen. Also reports some abdominal cramping but states it is separate from the other pain she is having.  1650- C/w Dr. Clearance Coots. Will admit patient to obs for fetal surveillance & pain management.  Assessment and Plan  A: 1. [redacted] weeks gestation of pregnancy   2. Abdominal trauma     P: Admit to  antenatal in observation Continue fetal monitoring Pain management  [name] 05/16/2015, 1:33 PM

## 2015-05-16 NOTE — MAU Note (Signed)
Pt fell on her abdomen this morning, slid on wet kitchen floor.  Now having lower abd pain & pressure.  Denies vaginal bleeding, reports good FM.

## 2015-05-17 MED ORDER — DOCUSATE SODIUM 100 MG PO CAPS: 100.0000 mg | ORAL_CAPSULE | Freq: Every day | ORAL | Status: DC

## 2015-05-17 MED ORDER — OXYCODONE HCL 10 MG PO TABS: 10.0000 mg | ORAL_TABLET | ORAL | Status: DC | PRN

## 2015-05-17 NOTE — H&P (Signed)
Jacqueline Clarke is a 30 y.o. female presenting for abdominal pain after fall with contusion to abdomen.. Maternal Medical History:  Reason for admission: Contractions.   Fetal activity: Perceived fetal activity is normal.   Last perceived fetal movement was within the past hour.    Prenatal complications: no prenatal complications Prenatal Complications - Diabetes: none.    OB History    Gravida Para Term Preterm AB TAB SAB Ectopic Multiple Living   6 3 3  0 2 0 2 0  3     Past Medical History  Diagnosis Date  . Asthma   . Scoliosis   . Blood type, Rh negative   . Anemia   . Chronic back pain     scoliosis- on percocet  . Hyperemesis complicating pregnancy, antepartum 2013  . Headache(784.0)   . Cholelithiasis   . Pregnancy induced hypertension   . 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 Obesity   . Depression   . Anxiety    Past Surgical History  Procedure Laterality Date  . Adenoidectomy    . Nerve, tendon and artery repair Right 06/18/2014    Procedure: NERVE, TENDON AND ARTERY REPAIR;  Surgeon: Bradly BienenstockFred Ortmann, MD;  Location: [facility] OR;  Service: Orthopedics;  Laterality: Right;  . Cholecystectomy    . Adenoidectomy    . Dilation and curettage of uterus  2008  . Arm surgery     Family History: family history includes Diabetes in her maternal grandfather; Heart disease in her father and maternal grandfather. There is no history of Anesthesia problems. Social History:  reports that she quit smoking about 4 years ago. Her smoking use included Cigarettes. She smoked 0.50 packs per day. She has never used smokeless tobacco. She reports that she does not drink alcohol or use illicit drugs.   Prenatal Transfer Tool  Maternal Diabetes: No Genetic Screening: Normal Maternal Ultrasounds/Referrals: Normal Fetal Ultrasounds or other Referrals:  None Maternal Substance Abuse:  No Significant Maternal Medications:  None Significant Maternal Lab Results:   None Other Comments:  None  Review of Systems  All other systems reviewed and are negative.   Dilation: Closed Effacement (%): Thick Exam by:: Estanislado SpireE. Lawrence, NP Blood pressure 124/73, pulse 97, temperature 98.2 F (36.8 C), temperature source Oral, resp. rate 18, height 5\' 10"  (1.778 m), weight 295 lb (133.811 kg), last menstrual period 09/09/2014. Maternal Exam:  Abdomen: Patient reports no abdominal tenderness.   Physical Exam  Nursing note and vitals reviewed. Constitutional: She is oriented to person, place, and time. She appears well-developed and well-nourished.  HENT:  Head: Normocephalic and atraumatic.  Eyes: Conjunctivae are normal. Pupils are equal, round, and reactive to light.  Neck: Normal range of motion. Neck supple.  Cardiovascular: Normal rate and regular rhythm.   Respiratory: Effort normal and breath sounds normal.  GI: Soft. Bowel sounds are normal.  Musculoskeletal: Normal range of motion.  Neurological: She is alert and oriented to person, place, and time.  Skin: Skin is warm and dry.  Psychiatric: She has a normal mood and affect. Her behavior is normal. Judgment and thought content normal.    Prenatal labs: ABO, Rh: --/--/O NEG (04/26 1240) Antibody: NEG (04/26 1240) Rubella: 1.03 (10/20 1531) RPR: Non Reactive (03/16 1045)  HBsAg: NEGATIVE (10/20 1531)  HIV: Non Reactive (03/16 1045)  GBS:     Assessment/Plan: 35 weeks.  Contusion to abdomen from fall, with persistent abdominal pain.  Admitted for observation.  Ingram Onnen A 05/17/2015, 4:46 AM

## 2015-05-17 NOTE — Discharge Summary (Signed)
Physician Discharge Summary  Patient ID: Jacqueline Clarke MRN: [id number] DOB/AGE: [birth year] 30 y.o.  Admit date: 05/16/2015 Discharge date: 05/17/2015  Admission Diagnoses: Abdominal trauma during pregnancy @34 .7 weeks  Discharge Diagnoses:  Abdominal trauma during 3rd trimester of pregnancy Active Problems:   Blunt abdominal trauma   Discharged Condition: good  Hospital Course: uneventful  Consults: MFM US  Significant Diagnostic Studies: none  Treatments: IV hydration  Discharge Exam: Blood pressure 108/57, pulse 89, temperature 98.1 F (36.7 C), temperature source Oral, resp. rate 16, height 5\' 10"  (1.778 m), weight 295 lb (133.811 kg), last menstrual period 09/09/2014. General appearance: alert, cooperative and no distress Resp: clear to auscultation bilaterally Cardio: regular rate and rhythm, S1, S2 normal, no murmur, click, rub or gallop  Disposition: 01-Home or Self Care     Medication List    ASK your doctor about these medications        benzonatate 100 MG capsule  Commonly known as:  TESSALON  Take 1 capsule (100 mg total) by mouth every 8 (eight) hours.     butalbital-acetaminophen-caffeine 50-325-40 MG tablet  Commonly known as:  FIORICET  Take 1-2 tablets by mouth every 6 (six) hours as needed for headache.     calcium carbonate 500 MG chewable tablet  Commonly known as:  TUMS - dosed in mg elemental calcium  Chew 2 tablets (400 mg of elemental calcium total) by mouth 3 (three) times daily with meals.     diphenhydrAMINE 25 MG tablet  Commonly known as:  BENADRYL  Take 2 tablets (50 mg total) by mouth at bedtime.     Doxylamine-Pyridoxine 10-10 MG Tbec  Commonly known as:  DICLEGIS  Take 1 tablet with breakfast and lunch.  Take 2 tablets at bedtime.     hydrocortisone 2.5 % ointment  Apply topically 2 (two) times daily.     hydrOXYzine 50 MG tablet  Commonly known as:  ATARAX/VISTARIL  Take 1 tablet (50 mg total) by mouth 3 (three)  times daily as needed.     omeprazole 20 MG capsule  Commonly known as:  PRILOSEC  Take 1 capsule (20 mg total) by mouth 2 (two) times daily before a meal.     oxyCODONE-acetaminophen 10-325 MG tablet  Commonly known as:  PERCOCET  TK 1 T PO QD PRN     QUEtiapine 100 MG tablet  Commonly known as:  SEROQUEL  Take 100 mg by mouth at bedtime.     VITAFOL FE+ 90-1-200 & 50 MG Cppk  Take 2 tablets by mouth daily.         Signed: [name] 05/17/2015, 4:45 PM

## 2015-05-17 NOTE — Progress Notes (Signed)
Patient ID: Earleen Reaperrachelle L Knisley, female   DOB: August 31, 1985, 30 y.o.   MRN: [id number]  Hospital Day: 2  Admitted 05/16/2015 after falling on her abdomen at home.  Reports +FM, denies any vaginal bleeding or LOF.  States that she has not had any contractions since early this morning.  Is out of work until Monday.  She told the nurse that she did go to work after the incident/fall yesterday and then came to [facility].    S: Preterm labor symptoms: occasional contractions.   O: Blood pressure 124/73, pulse 97, temperature 98.2 F (36.8 C), temperature source Oral, resp. rate 18, height 5\' 10"  (1.778 m), weight 295 lb (133.811 kg), last menstrual period 09/09/2014.   WUJ:WJXBJYNWFHT:Baseline: 140 bpm, Variability: Good {> 6 bpm), Accelerations: Reactive and Decelerations: Absent Toco: None GNF:AOZHYQMVSVE:Dilation: Closed Effacement (%): Thick Exam by:: Estanislado SpireE. Lawrence, NP  A/P- 30 y.o. admitted with: abdominal trauma.    Present on Admission:  . Blunt abdominal trauma  Pregnancy Complications: abdominal trauma 05/16/15, status post fall on gravid abdomen  Preterm labor management: bedrest advised and pelvic rest advised Dating:  6538w0d PNL Needed:  none FWB:  good PTL:  stable

## 2015-05-17 NOTE — Progress Notes (Signed)
Patient ready for discharge, instructions given.  She understands what symptoms to return to hospital for (decreased fetal movement, ROM, bleeding, and pain not relieved by tylenol.

## 2015-05-22 ENCOUNTER — Ambulatory Visit (HOSPITAL_COMMUNITY)
Admission: RE | Admit: 2015-05-22 | Discharge: 2015-05-22 | Disposition: A | Discharge status: Home / Self Care | Payer: Medicaid Other | Source: Ambulatory Visit | Attending: Certified Nurse Midwife | Admitting: Certified Nurse Midwife

## 2015-05-22 ENCOUNTER — Ambulatory Visit (INDEPENDENT_AMBULATORY_CARE_PROVIDER_SITE_OTHER): Payer: Medicaid Other | Admitting: Certified Nurse Midwife

## 2015-05-22 VITALS — BP 116/83 | HR 82 | Wt 297.0 lb

## 2015-05-22 DIAGNOSIS — O2203 Varicose veins of lower extremity in pregnancy, third trimester: Secondary | ICD-10-CM | POA: Insufficient documentation

## 2015-05-22 DIAGNOSIS — Z3483 Encounter for supervision of other normal pregnancy, third trimester: Secondary | ICD-10-CM

## 2015-05-22 DIAGNOSIS — O2200 Varicose veins of lower extremity in pregnancy, unspecified trimester: Secondary | ICD-10-CM | POA: Diagnosis not present

## 2015-05-22 DIAGNOSIS — M79604 Pain in right leg: Secondary | ICD-10-CM

## 2015-05-22 DIAGNOSIS — Z3A35 35 weeks gestation of pregnancy: Secondary | ICD-10-CM | POA: Diagnosis not present

## 2015-05-22 NOTE — Progress Notes (Signed)
VASCULAR LAB PRELIMINARY  PRELIMINARY  PRELIMINARY  PRELIMINARY  Bilateral lower extremity venous duplex completed.    Preliminary report:  Bilateral:  No evidence of DVT, superficial thrombosis, or Baker's Cyst. Multiple non thrombosed varicose veins on the posterior lateral right thigh.   Kyannah Climer, RVS 05/22/2015, 3:36 PM

## 2015-05-22 NOTE — Progress Notes (Signed)
Subjective:    Jacqueline Clarke is a 30 y.o. female being seen today for her obstetrical visit. She is at 7153w5d gestation. Patient reports no bleeding, no leaking, occasional contractions, vaginal irritation and right leg pain, constant, making it hard to walk.. Fetal movement: normal.  Problem List Items Addressed This Visit    None    Visit Diagnoses    Encounter for supervision of other normal pregnancy in third trimester    -  Primary    Relevant Orders    Strep Gp B NAA    POCT urinalysis dipstick    NuSwab Vaginitis Plus (VG+)    Right leg pain        Relevant Orders    VAS US LOWER EXTREMITY VENOUS (DVT)    Varicose veins during pregnancy, antepartum        Relevant Orders    VAS US LOWER EXTREMITY VENOUS (DVT)      Patient Active Problem List   Diagnosis Date Noted  . Blunt abdominal trauma 05/16/2015  . Hyperemesis gravidarum before end of [redacted] week gestation, dehydration 11/06/2014  . Malnutrition of moderate degree 11/03/2014  . Depressive disorder 12/14/2012  . Normal delivery 07/24/2011   Objective:    BP 116/83 mmHg  Pulse 82  Wt 297 lb (134.718 kg)  LMP 09/09/2014 FHT:  145 BPM  Uterine Size: unable to determine d/t body habitus  Presentation: unsure   Right thigh near knee, swollen, tender to touch, erythremic.  +varicose veins over both extremities.   Update: vascular lab: no evidence of DVT or other venous impairment.   Assessment:    Pregnancy @ 3753w5d weeks   Varicose veins  Plan:   written Rx for compression stockings   labs reviewed, problem list updated Consent signed. GBS sent TDAP offered  Rhogam given for RH negative Pediatrician: discussed. Infant feeding: plans to breastfeed. Maternity leave: discussed. Cigarette smoking: never smoked. Orders Placed This Encounter  Procedures  . Strep Gp B NAA  . NuSwab Vaginitis Plus (VG+)  . POCT urinalysis dipstick   No orders of the defined types were placed in this encounter.    Follow up in 1 Week.

## 2015-05-23 LAB — OB RESULTS CONSOLE GBS: STREP GROUP B AG: NEGATIVE

## 2015-05-24 LAB — STREP GP B NAA: STREP GROUP B AG: NEGATIVE

## 2015-05-25 ENCOUNTER — Other Ambulatory Visit: Payer: Self-pay | Admitting: Certified Nurse Midwife

## 2015-05-25 DIAGNOSIS — B9689 Other specified bacterial agents as the cause of diseases classified elsewhere: Secondary | ICD-10-CM

## 2015-05-25 DIAGNOSIS — N760 Acute vaginitis: Principal | ICD-10-CM

## 2015-05-25 LAB — NUSWAB VAGINITIS PLUS (VG+)
ATOPOBIUM VAGINAE: HIGH {score} — AB
BVAB 2: HIGH Score — AB
CANDIDA ALBICANS, NAA: NEGATIVE
CANDIDA GLABRATA, NAA: NEGATIVE
Chlamydia trachomatis, NAA: NEGATIVE
Megasphaera 1: HIGH Score — AB
Neisseria gonorrhoeae, NAA: NEGATIVE
TRICH VAG BY NAA: NEGATIVE

## 2015-05-25 MED ORDER — METRONIDAZOLE 500 MG PO TABS: 500.0000 mg | ORAL_TABLET | Freq: Two times a day (BID) | ORAL | Status: DC

## 2015-05-28 ENCOUNTER — Encounter: Payer: Self-pay | Admitting: *Deleted

## 2015-05-29 ENCOUNTER — Encounter (HOSPITAL_COMMUNITY): Payer: Self-pay | Admitting: *Deleted

## 2015-05-29 ENCOUNTER — Ambulatory Visit (INDEPENDENT_AMBULATORY_CARE_PROVIDER_SITE_OTHER): Payer: Medicaid Other | Admitting: Certified Nurse Midwife

## 2015-05-29 ENCOUNTER — Inpatient Hospital Stay (HOSPITAL_COMMUNITY)
Admission: AD | Admit: 2015-05-29 | Discharge: 2015-05-29 | Disposition: A | Discharge status: Home / Self Care | Payer: Medicaid Other | Source: Ambulatory Visit | Attending: Obstetrics | Admitting: Obstetrics

## 2015-05-29 VITALS — BP 112/80 | HR 80 | Wt 297.0 lb

## 2015-05-29 DIAGNOSIS — Z3493 Encounter for supervision of normal pregnancy, unspecified, third trimester: Secondary | ICD-10-CM | POA: Diagnosis not present

## 2015-05-29 DIAGNOSIS — Z3A35 35 weeks gestation of pregnancy: Secondary | ICD-10-CM | POA: Insufficient documentation

## 2015-05-29 LAB — POCT URINALYSIS DIPSTICK
Bilirubin, UA: NEGATIVE
Blood, UA: NEGATIVE
GLUCOSE UA: NEGATIVE
Ketones, UA: NEGATIVE
NITRITE UA: NEGATIVE
Protein, UA: NEGATIVE
Spec Grav, UA: 1.015
UROBILINOGEN UA: NEGATIVE
pH, UA: 6

## 2015-05-29 MED ORDER — OXYCODONE-ACETAMINOPHEN 5-325 MG PO TABS
2.0000 | ORAL_TABLET | Freq: Once | ORAL | Status: AC
Start: 2015-05-29 — End: 2015-05-29
  Administered 2015-05-29: 2 via ORAL
  Filled 2015-05-29: qty 2

## 2015-05-29 NOTE — MAU Note (Signed)
?   Leaking fluid since this am, contractions an pressure.

## 2015-05-29 NOTE — Discharge Instructions (Signed)
Braxton Hicks Contractions °Contractions of the uterus can occur throughout pregnancy. Contractions are not always a sign that you are in labor.  °WHAT ARE BRAXTON HICKS CONTRACTIONS?  °Contractions that occur before labor are called Braxton Hicks contractions, or false labor. Toward the end of pregnancy (32-34 weeks), these contractions can develop more often and may become more forceful. This is not true labor because these contractions do not result in opening (dilatation) and thinning of the cervix. They are sometimes difficult to tell apart from true labor because these contractions can be forceful and people have different pain tolerances. You should not feel embarrassed if you go to the hospital with false labor. Sometimes, the only way to tell if you are in true labor is for your health care provider to look for changes in the cervix. °If there are no prenatal problems or other health problems associated with the pregnancy, it is completely safe to be sent home with false labor and await the onset of true labor. °HOW CAN YOU TELL THE DIFFERENCE BETWEEN TRUE AND FALSE LABOR? °False Labor °· The contractions of false labor are usually shorter and not as hard as those of true labor.   °· The contractions are usually irregular.   °· The contractions are often felt in the front of the lower abdomen and in the groin.   °· The contractions may go away when you walk around or change positions while lying down.   °· The contractions get weaker and are shorter lasting as time goes on.   °· The contractions do not usually become progressively stronger, regular, and closer together as with true labor.   °True Labor °· Contractions in true labor last 30-70 seconds, become very regular, usually become more intense, and increase in frequency.   °· The contractions do not go away with walking.   °· The discomfort is usually felt in the top of the uterus and spreads to the lower abdomen and low back.   °· True labor can be  determined by your health care provider with an exam. This will show that the cervix is dilating and getting thinner.   °WHAT TO REMEMBER °· Keep up with your usual exercises and follow other instructions given by your health care provider.   °· Take medicines as directed by your health care provider.   °· Keep your regular prenatal appointments.   °· Eat and drink lightly if you think you are going into labor.   °· If Braxton Hicks contractions are making you uncomfortable:   °¨ Change your position from lying down or resting to walking, or from walking to resting.   °¨ Sit and rest in a tub of warm water.   °¨ Drink 2-3 glasses of water. Dehydration may cause these contractions.   °¨ Do slow and deep breathing several times an hour.   °WHEN SHOULD I SEEK IMMEDIATE MEDICAL CARE? °Seek immediate medical care if: °· Your contractions become stronger, more regular, and closer together.   °· You have fluid leaking or gushing from your vagina.   °· You have a fever.   °· You pass blood-tinged mucus.   °· You have vaginal bleeding.   °· You have continuous abdominal pain.   °· You have low back pain that you never had before.   °· You feel your baby's head pushing down and causing pelvic pressure.   °· Your baby is not moving as much as it used to.   °  °This information is not intended to replace advice given to you by your health care provider. Make sure you discuss any questions you have with your health care   provider. °  °Document Released: 01/06/2005 Document Revised: 01/11/2013 Document Reviewed: 10/18/2012 °Elsevier Interactive Patient Education ©2016 [employer] ° °

## 2015-05-29 NOTE — Progress Notes (Signed)
Patient has no questions or concerns.

## 2015-05-29 NOTE — Progress Notes (Signed)
Subjective:    Earleen Reaperrachelle L Drzewiecki is a 30 y.o. female being seen today for her obstetrical visit. She is at 6968w5d gestation. Patient reports no bleeding, no leaking and occasional contractions. Fetal movement: normal.  Problem List Items Addressed This Visit    None    Visit Diagnoses    Prenatal care, third trimester    -  Primary    Relevant Orders    POCT urinalysis dipstick (Completed)      Patient Active Problem List   Diagnosis Date Noted  . Blunt abdominal trauma 05/16/2015  . Hyperemesis gravidarum before end of [redacted] week gestation, dehydration 11/06/2014  . Malnutrition of moderate degree 11/03/2014  . Depressive disorder 12/14/2012  . Normal delivery 07/24/2011   Objective:    BP 112/80 mmHg  Pulse 80  Wt 297 lb (134.718 kg)  LMP 09/09/2014 FHT:  135 BPM  Uterine Size: size equals dates  Presentation: cephalic   Cervix: long, thick, soft, midline, closed  Assessment:    Pregnancy @ 4868w5d weeks   Plan:     labs reviewed, problem list updated Consent signed. GBS sent TDAP offered  Rhogam given for RH negative Pediatrician: discussed. Infant feeding: plans to breastfeed. Maternity leave: discussed. Cigarette smoking: never smoked. Orders Placed This Encounter  Procedures  . POCT urinalysis dipstick   No orders of the defined types were placed in this encounter.   Follow up in 1 Week.

## 2015-05-31 ENCOUNTER — Encounter: Payer: Self-pay | Admitting: *Deleted

## 2015-05-31 ENCOUNTER — Telehealth: Payer: Self-pay | Admitting: *Deleted

## 2015-05-31 ENCOUNTER — Ambulatory Visit (INDEPENDENT_AMBULATORY_CARE_PROVIDER_SITE_OTHER): Payer: Medicaid Other | Admitting: Certified Nurse Midwife

## 2015-05-31 VITALS — BP 114/76 | HR 92 | Temp 98.9°F | Wt 300.0 lb

## 2015-05-31 DIAGNOSIS — O9989 Other specified diseases and conditions complicating pregnancy, childbirth and the puerperium: Secondary | ICD-10-CM

## 2015-05-31 DIAGNOSIS — R102 Pelvic and perineal pain: Secondary | ICD-10-CM

## 2015-05-31 DIAGNOSIS — G4700 Insomnia, unspecified: Secondary | ICD-10-CM

## 2015-05-31 DIAGNOSIS — O26893 Other specified pregnancy related conditions, third trimester: Secondary | ICD-10-CM

## 2015-05-31 DIAGNOSIS — O479 False labor, unspecified: Secondary | ICD-10-CM

## 2015-05-31 DIAGNOSIS — R319 Hematuria, unspecified: Secondary | ICD-10-CM

## 2015-05-31 LAB — POCT URINALYSIS DIPSTICK
Bilirubin, UA: NEGATIVE
GLUCOSE UA: NEGATIVE
KETONES UA: NEGATIVE
Nitrite, UA: NEGATIVE
Protein, UA: NEGATIVE
RBC UA: 50
SPEC GRAV UA: 1.015
UROBILINOGEN UA: NEGATIVE
pH, UA: 6

## 2015-05-31 MED ORDER — OXYCODONE-ACETAMINOPHEN 5-325 MG PO TABS: 1.0000 | ORAL_TABLET | ORAL | Status: DC | PRN

## 2015-05-31 MED ORDER — ZOLPIDEM TARTRATE 10 MG PO TABS: 10.0000 mg | ORAL_TABLET | Freq: Every evening | ORAL | Status: DC | PRN

## 2015-05-31 NOTE — Telephone Encounter (Signed)
Patient is having pain and pressure. She is not sleeping. She is having irregular contractions. Told patient to come to office- will put her on the monitor and we will evaluate her.

## 2015-05-31 NOTE — Progress Notes (Signed)
Subjective:    Jacqueline Clarke is a 30 y.o. female being seen today for her obstetrical visit. She is at 4973w0d gestation. Patient reports backache, no bleeding, no leaking, occasional contractions and pelvic girdle pain, hx of recent fall. Fetal movement: normal.  Problem List Items Addressed This Visit    None    Visit Diagnoses    Irregular uterine contractions    -  Primary    Relevant Orders    Fetal non-stress test    Hematuria        Relevant Orders    Culture, OB Urine    POCT urinalysis dipstick (Completed)    Pelvic pain affecting pregnancy in third trimester, antepartum        Relevant Medications    zolpidem (AMBIEN) 10 MG tablet    oxyCODONE-acetaminophen (PERCOCET/ROXICET) 5-325 MG tablet    Other Relevant Orders    POCT urinalysis dipstick (Completed)    Insomnia        Relevant Medications    zolpidem (AMBIEN) 10 MG tablet      Patient Active Problem List   Diagnosis Date Noted  . Blunt abdominal trauma 05/16/2015  . Hyperemesis gravidarum before end of [redacted] week gestation, dehydration 11/06/2014  . Malnutrition of moderate degree 11/03/2014  . Depressive disorder 12/14/2012  . Normal delivery 07/24/2011    Objective:    BP 114/76 mmHg  Pulse 92  Temp(Src) 98.9 F (37.2 C)  Wt 300 lb (136.079 kg)  LMP 09/09/2014 FHT: 150 BPM  Uterine Size: unable to determine d/t body habitus  Presentations: cephalic  Pelvic Exam:              Dilation: Closed       Effacement: Long             Station:  Floating    Consistency: soft            Position: posterior   NST: reactive, moderate variability, + accels, no decels, cat. 1 tracing.  Uterine irritability present, irregular pattern, mild.    Assessment:    Pregnancy @ 2873w0d weeks   pelvic girdle pain  recent hx of fall during pregnancy  insomnia  braxton hicks contractions  Plan:   Plans for delivery: Vaginal anticipated; labs reviewed; problem list updated Counseling: Consent signed. Infant  feeding: plans to breastfeed. Cigarette smoking: never smoked. L&D discussion: symptoms of labor, discussed when to call, discussed what number to call, anesthetic/analgesic options reviewed and delivering clinician:  plans no preference. Postpartum supports and preparation: circumcision discussed and contraception plans discussed.  Follow up in 1 Week.

## 2015-05-31 NOTE — Progress Notes (Signed)
Patient called this morning with complaints of pain and pressure so bad she was unable to sleep. She states she is having irregular contractions and wants to go to the hospital. Encouraged patient to come to office for evaluation.

## 2015-06-02 ENCOUNTER — Encounter (HOSPITAL_COMMUNITY): Payer: Self-pay | Admitting: *Deleted

## 2015-06-02 ENCOUNTER — Inpatient Hospital Stay (HOSPITAL_COMMUNITY)
Admission: AD | Admit: 2015-06-02 | Discharge: 2015-06-02 | Disposition: A | Discharge status: Home / Self Care | Payer: Medicaid Other | Source: Ambulatory Visit | Attending: Obstetrics | Admitting: Obstetrics

## 2015-06-02 DIAGNOSIS — Z3493 Encounter for supervision of normal pregnancy, unspecified, third trimester: Secondary | ICD-10-CM | POA: Diagnosis not present

## 2015-06-02 LAB — CULTURE, OB URINE

## 2015-06-02 LAB — URINE CULTURE, OB REFLEX

## 2015-06-02 NOTE — MAU Note (Signed)
Pt reprots she has been having ctx on ans off all day got stronger tonight. reprots some bloody show yesterday. Non leaking or bleeding tonight. +FM

## 2015-06-05 ENCOUNTER — Ambulatory Visit (INDEPENDENT_AMBULATORY_CARE_PROVIDER_SITE_OTHER): Payer: Medicaid Other | Admitting: Obstetrics

## 2015-06-05 ENCOUNTER — Encounter: Payer: Medicaid Other | Admitting: Certified Nurse Midwife

## 2015-06-05 ENCOUNTER — Encounter: Payer: Self-pay | Admitting: Obstetrics

## 2015-06-05 VITALS — BP 117/79 | HR 91 | Wt 301.0 lb

## 2015-06-05 DIAGNOSIS — Z3493 Encounter for supervision of normal pregnancy, unspecified, third trimester: Secondary | ICD-10-CM

## 2015-06-05 LAB — POCT URINALYSIS DIPSTICK
Bilirubin, UA: NEGATIVE
Blood, UA: NEGATIVE
GLUCOSE UA: NEGATIVE
Ketones, UA: NEGATIVE
NITRITE UA: NEGATIVE
Protein, UA: NEGATIVE
Spec Grav, UA: 1.015
UROBILINOGEN UA: NEGATIVE
pH, UA: 6

## 2015-06-05 NOTE — Progress Notes (Signed)
Subjective:    Jacqueline Clarke is a 30 y.o. female being seen today for her obstetrical visit. She is at 4095w5d gestation. Patient reports no complaints. Fetal movement: normal.  Problem List Items Addressed This Visit    None    Visit Diagnoses    Prenatal care in third trimester    -  Primary    Relevant Orders    POCT Urinalysis Dipstick (Completed)      Patient Active Problem List   Diagnosis Date Noted  . Blunt abdominal trauma 05/16/2015  . Hyperemesis gravidarum before end of [redacted] week gestation, dehydration 11/06/2014  . Malnutrition of moderate degree 11/03/2014  . Depressive disorder 12/14/2012  . Normal delivery 07/24/2011   Objective:    BP 117/79 mmHg  Pulse 91  Wt 301 lb (136.533 kg)  LMP 09/09/2014 FHT:  150 BPM  Uterine Size: size equals dates  Presentation: cephalic     Assessment:    Pregnancy @ 4895w5d weeks   Plan:     labs reviewed, problem list updated Consent signed. GBS sent TDAP offered  Rhogam given for RH negative Pediatrician: discussed. Infant feeding: plans to breastfeed. Maternity leave: discussed. Cigarette smoking: former smoker. Orders Placed This Encounter  Procedures  . POCT Urinalysis Dipstick   No orders of the defined types were placed in this encounter.   Follow up in 1 Week.

## 2015-06-11 ENCOUNTER — Encounter (HOSPITAL_COMMUNITY): Payer: Self-pay | Admitting: *Deleted

## 2015-06-11 ENCOUNTER — Inpatient Hospital Stay (HOSPITAL_COMMUNITY)
Admission: AD | Admit: 2015-06-11 | Discharge: 2015-06-11 | Disposition: A | Discharge status: Home / Self Care | Payer: Medicaid Other | Source: Ambulatory Visit | Attending: Obstetrics | Admitting: Obstetrics

## 2015-06-11 DIAGNOSIS — O9989 Other specified diseases and conditions complicating pregnancy, childbirth and the puerperium: Secondary | ICD-10-CM | POA: Diagnosis not present

## 2015-06-11 DIAGNOSIS — Z87891 Personal history of nicotine dependence: Secondary | ICD-10-CM | POA: Diagnosis not present

## 2015-06-11 DIAGNOSIS — N898 Other specified noninflammatory disorders of vagina: Secondary | ICD-10-CM | POA: Diagnosis not present

## 2015-06-11 DIAGNOSIS — O26893 Other specified pregnancy related conditions, third trimester: Secondary | ICD-10-CM | POA: Diagnosis not present

## 2015-06-11 DIAGNOSIS — Z3A38 38 weeks gestation of pregnancy: Secondary | ICD-10-CM | POA: Insufficient documentation

## 2015-06-11 DIAGNOSIS — M419 Scoliosis, unspecified: Secondary | ICD-10-CM | POA: Insufficient documentation

## 2015-06-11 DIAGNOSIS — O1203 Gestational edema, third trimester: Secondary | ICD-10-CM | POA: Diagnosis not present

## 2015-06-11 DIAGNOSIS — Z0371 Encounter for suspected problem with amniotic cavity and membrane ruled out: Secondary | ICD-10-CM

## 2015-06-11 LAB — POCT FERN TEST: POCT Fern Test: NEGATIVE

## 2015-06-11 NOTE — MAU Note (Signed)
Urine collected and sent to lab.

## 2015-06-11 NOTE — MAU Note (Signed)
Pt reports ? Leaking fluid since 2 pm. Also reports swelling in her hands and feet and abd pain.

## 2015-06-11 NOTE — MAU Provider Note (Signed)
None     Chief Complaint: Jacqueline Clarke is  30 y.o. [id number] at 8163w4d presents complaining  ? Leaking fluid twice since 1400, once when she got up from a nap and once at 1830.  Has had LBP "w/contractions", some strong and frequent, others not.  Also noticed her hands are swollen. Is still taking medication for BV.   Obstetrical/Gynecological History: OB History    Gravida Para Term Preterm AB TAB SAB Ectopic Multiple Living   6 3 3  0 2 0 2 0  3     Past Medical History: Past Medical History  Diagnosis Date  . Asthma   . Scoliosis   . Blood type, Rh negative   . Anemia   . Chronic back pain     scoliosis- on percocet  . Hyperemesis complicating pregnancy, antepartum 2013  . Headache(784.0)   . Cholelithiasis   . Pregnancy induced hypertension   . Obesity   . Depression   . Anxiety   . Infection     hx MRSA; 3 negative tests since    Past Surgical History: Past Surgical History  Procedure Laterality Date  . Adenoidectomy    . Nerve, tendon and artery repair Right 06/18/2014    Procedure: NERVE, TENDON AND ARTERY REPAIR;  Surgeon: Bradly BienenstockFred Ortmann, MD;  Location: [facility] OR;  Service: Orthopedics;  Laterality: Right;  . Cholecystectomy    . Adenoidectomy    . Dilation and curettage of uterus  2008  . Arm surgery      Family History: Family History  Problem Relation Age of Onset  . Heart disease Maternal Grandfather   . Diabetes Maternal Grandfather   . Anesthesia problems Neg Hx   . Heart disease Father     Social History: Social History  Substance Use Topics  . Smoking status: Former Smoker -- 0.50 packs/day    Types: Cigarettes    Quit date: 10/18/2014  . Smokeless tobacco: Never Used  . Alcohol Use: No     Comment: Occassionally    Allergies: No Known Allergies  Meds:  Prescriptions prior to admission  Medication Sig Dispense Refill Last Dose  . butalbital-acetaminophen-caffeine (FIORICET) 50-325-40 MG tablet Take 1-2 tablets by mouth every 6 (six)  hours as needed for headache. 45 tablet 4 Taking  . calcium carbonate (TUMS - DOSED IN MG ELEMENTAL CALCIUM) 500 MG chewable tablet Chew 2 tablets (400 mg of elemental calcium total) by mouth 3 (three) times daily with meals. 120 tablet 6 Taking  . diphenhydrAMINE (BENADRYL) 25 MG tablet Take 2 tablets (50 mg total) by mouth at bedtime. 90 tablet 4 Taking  . docusate sodium (COLACE) 100 MG capsule Take 1 capsule (100 mg total) by mouth daily. 90 capsule PRN Taking  . Doxylamine-Pyridoxine (DICLEGIS) 10-10 MG TBEC Take 1 tablet with breakfast and lunch.  Take 2 tablets at bedtime. 100 tablet 4 Taking  . hydrocortisone 2.5 % ointment Apply topically 2 (two) times daily. (Patient not taking: Reported on 06/05/2015) 30 g 0 Not Taking  . hydrOXYzine (ATARAX/VISTARIL) 50 MG tablet Take 1 tablet (50 mg total) by mouth 3 (three) times daily as needed. 30 tablet 0 Taking  . metroNIDAZOLE (FLAGYL) 500 MG tablet Take 1 tablet (500 mg total) by mouth 2 (two) times daily. 14 tablet 0 Taking  . omeprazole (PRILOSEC) 20 MG capsule Take 1 capsule (20 mg total) by mouth 2 (two) times daily before a meal. 60 capsule 5 Taking  . oxyCODONE-acetaminophen (PERCOCET/ROXICET) 5-325 MG tablet Take 1-2  tablets by mouth every 4 (four) hours as needed for severe pain. 30 tablet 0 Taking  . Prenat-FePoly-Metf-FA-DHA-DSS (VITAFOL FE+) 90-1-200 & 50 MG CPPK Take 2 tablets by mouth daily. 60 each 12 Taking  . zolpidem (AMBIEN) 10 MG tablet Take 1 tablet (10 mg total) by mouth at bedtime as needed for sleep. 30 tablet 0 Taking    Review of Systems   Constitutional: Negative for fever and chills Eyes: Negative for visual disturbances Respiratory: Negative for shortness of breath, dyspnea Cardiovascular: Negative for chest pain or palpitations  Gastrointestinal: Negative for vomiting, diarrhea and constipation Genitourinary: Negative for dysuria and urgency Musculoskeletal: Negative for joint pain, myalgias.  Normal ROM   Neurological: Negative for dizziness and headaches    Physical Exam  Blood pressure 126/80, pulse 86, temperature 98.3 F (36.8 C), temperature source Oral, resp. rate 20, height  (1.778 m), weight 137.44 kg (303 lb), last menstrual period 09/09/2014, SpO2 99 %. GENERAL: Well-developed, well-nourished female in no acute distress.  LUNGS: Clear to auscultation bilaterally.  HEART: Regular rate and rhythm. ABDOMEN: Soft, nontender, nondistended, gravid.  EXTREMITIES: Nontender, no edema, 2+ distal pulses. DTR's 2+ PELVIC:  SSE:  Thin white DC w/amine odor.  Negative pooling, valsalva, and fern.  Wet prep:  Few clues, no yeast or trich CERVICAL EXAM: Dilatation 1.5cm   Effacement 0%   Station -2   Presentation: cephalic FHT:  Baseline rate 145 bpm   Variability moderate  Accelerations present   Decelerations none Contractions: Every 0 mins   Labs: No results found for this or any previous visit (from the past 24 hour(s)). Imaging Studies:  [place] Mfm Ob Limited  05/16/2015  OBSTETRICAL ULTRASOUND: This exam was performed within a [facility]. The OB US report was generated in the [facility] system, and faxed to the ordering physician.  This report is available in the [employer]. See the [facility] Obstetric US report via the Image Link.   Assessment: Jacqueline Clarke is  30 y.o. [id number] at [redacted]w[redacted]d presents with no evidence of ROM/labor. .  Plan: DC home. Finish BV meds  CRESENZO-DISHMAN,Dragan Tamburrino 5/22/20177:28 PM

## 2015-06-11 NOTE — Discharge Instructions (Signed)
Braxton Hicks Contractions °Contractions of the uterus can occur throughout pregnancy. Contractions are not always a sign that you are in labor.  °WHAT ARE BRAXTON HICKS CONTRACTIONS?  °Contractions that occur before labor are called Braxton Hicks contractions, or false labor. Toward the end of pregnancy (32-34 weeks), these contractions can develop more often and may become more forceful. This is not true labor because these contractions do not result in opening (dilatation) and thinning of the cervix. They are sometimes difficult to tell apart from true labor because these contractions can be forceful and people have different pain tolerances. You should not feel embarrassed if you go to the hospital with false labor. Sometimes, the only way to tell if you are in true labor is for your health care provider to look for changes in the cervix. °If there are no prenatal problems or other health problems associated with the pregnancy, it is completely safe to be sent home with false labor and await the onset of true labor. °HOW CAN YOU TELL THE DIFFERENCE BETWEEN TRUE AND FALSE LABOR? °False Labor °· The contractions of false labor are usually shorter and not as hard as those of true labor.   °· The contractions are usually irregular.   °· The contractions are often felt in the front of the lower abdomen and in the groin.   °· The contractions may go away when you walk around or change positions while lying down.   °· The contractions get weaker and are shorter lasting as time goes on.   °· The contractions do not usually become progressively stronger, regular, and closer together as with true labor.   °True Labor °· Contractions in true labor last 30-70 seconds, become very regular, usually become more intense, and increase in frequency.   °· The contractions do not go away with walking.   °· The discomfort is usually felt in the top of the uterus and spreads to the lower abdomen and low back.   °· True labor can be  determined by your health care provider with an exam. This will show that the cervix is dilating and getting thinner.   °WHAT TO REMEMBER °· Keep up with your usual exercises and follow other instructions given by your health care provider.   °· Take medicines as directed by your health care provider.   °· Keep your regular prenatal appointments.   °· Eat and drink lightly if you think you are going into labor.   °· If Braxton Hicks contractions are making you uncomfortable:   °¨ Change your position from lying down or resting to walking, or from walking to resting.   °¨ Sit and rest in a tub of warm water.   °¨ Drink 2-3 glasses of water. Dehydration may cause these contractions.   °¨ Do slow and deep breathing several times an hour.   °WHEN SHOULD I SEEK IMMEDIATE MEDICAL CARE? °Seek immediate medical care if: °· Your contractions become stronger, more regular, and closer together.   °· You have fluid leaking or gushing from your vagina.   °· You have a fever.   °· You pass blood-tinged mucus.   °· You have vaginal bleeding.   °· You have continuous abdominal pain.   °· You have low back pain that you never had before.   °· You feel your baby's head pushing down and causing pelvic pressure.   °· Your baby is not moving as much as it used to.   °  °This information is not intended to replace advice given to you by your health care provider. Make sure you discuss any questions you have with your health care   provider. °  °Document Released: 01/06/2005 Document Revised: 01/11/2013 Document Reviewed: 10/18/2012 °Elsevier Interactive Patient Education ©2016 [employer] ° °

## 2015-06-12 ENCOUNTER — Ambulatory Visit (INDEPENDENT_AMBULATORY_CARE_PROVIDER_SITE_OTHER): Payer: Medicaid Other | Admitting: Certified Nurse Midwife

## 2015-06-12 VITALS — BP 113/78 | HR 83 | Wt 303.0 lb

## 2015-06-12 DIAGNOSIS — Z3493 Encounter for supervision of normal pregnancy, unspecified, third trimester: Secondary | ICD-10-CM

## 2015-06-12 LAB — POCT URINALYSIS DIPSTICK
Bilirubin, UA: NEGATIVE
Blood, UA: NEGATIVE
GLUCOSE UA: NEGATIVE
KETONES UA: NEGATIVE
Nitrite, UA: NEGATIVE
SPEC GRAV UA: 1.02
Urobilinogen, UA: NEGATIVE
pH, UA: 6

## 2015-06-12 NOTE — Progress Notes (Signed)
Subjective:    Earleen Reaperrachelle L Kimple is a 30 y.o. female being seen today for her obstetrical visit. She is at 6189w5d gestation. Patient reports no complaints. Fetal movement: normal.  Problem List Items Addressed This Visit    None    Visit Diagnoses    Prenatal care in third trimester    -  Primary    Relevant Orders    POCT Urinalysis Dipstick (Completed)      Patient Active Problem List   Diagnosis Date Noted  . Blunt abdominal trauma 05/16/2015  . Hyperemesis gravidarum before end of [redacted] week gestation, dehydration 11/06/2014  . Malnutrition of moderate degree 11/03/2014  . Depressive disorder 12/14/2012  . Normal delivery 07/24/2011    Objective:    BP 113/78 mmHg  Pulse 83  Wt 303 lb (137.44 kg)  LMP 09/09/2014 FHT: 135 BPM  Uterine Size: unable to determine d/t body habitus  Presentations: cephalic  Pelvic Exam: deferrred     Assessment:    Pregnancy @ 5289w5d weeks   Plan:   Plans for delivery: Vaginal anticipated; labs reviewed; problem list updated Counseling: Consent signed. Infant feeding: plans to breastfeed. Cigarette smoking: never smoked. L&D discussion: symptoms of labor, discussed when to call, discussed what number to call, anesthetic/analgesic options reviewed and delivering clinician:  plans no preference. Postpartum supports and preparation: circumcision discussed and contraception plans discussed.  Follow up in 1 Week.

## 2015-06-15 ENCOUNTER — Inpatient Hospital Stay (HOSPITAL_COMMUNITY)
Admission: AD | Admit: 2015-06-15 | Discharge: 2015-06-15 | Disposition: A | Discharge status: Home / Self Care | Payer: Medicaid Other | Source: Ambulatory Visit | Attending: Obstetrics | Admitting: Obstetrics

## 2015-06-15 ENCOUNTER — Encounter (HOSPITAL_COMMUNITY): Payer: Self-pay | Admitting: *Deleted

## 2015-06-15 DIAGNOSIS — Z3493 Encounter for supervision of normal pregnancy, unspecified, third trimester: Secondary | ICD-10-CM | POA: Diagnosis not present

## 2015-06-15 MED ORDER — OXYCODONE-ACETAMINOPHEN 5-325 MG PO TABS
2.0000 | ORAL_TABLET | Freq: Once | ORAL | Status: AC
Start: 2015-06-15 — End: 2015-06-15
  Administered 2015-06-15: 2 via ORAL
  Filled 2015-06-15: qty 2

## 2015-06-15 MED ORDER — ONDANSETRON 8 MG PO TBDP
8.0000 mg | ORAL_TABLET | Freq: Once | ORAL | Status: AC
  Administered 2015-06-15: 8 mg via ORAL
  Filled 2015-06-15: qty 1

## 2015-06-15 NOTE — Discharge Instructions (Signed)

## 2015-06-15 NOTE — MAU Note (Signed)
Contractions since 0030

## 2015-06-19 ENCOUNTER — Inpatient Hospital Stay (HOSPITAL_COMMUNITY)
Admission: AD | Admit: 2015-06-19 | Discharge: 2015-06-19 | Disposition: A | Discharge status: Home / Self Care | Payer: Medicaid Other | Source: Ambulatory Visit | Attending: Obstetrics | Admitting: Obstetrics

## 2015-06-19 ENCOUNTER — Encounter (HOSPITAL_COMMUNITY): Payer: Self-pay

## 2015-06-19 DIAGNOSIS — R51 Headache: Secondary | ICD-10-CM | POA: Insufficient documentation

## 2015-06-19 DIAGNOSIS — Z3A39 39 weeks gestation of pregnancy: Secondary | ICD-10-CM | POA: Insufficient documentation

## 2015-06-19 DIAGNOSIS — Z3A40 40 weeks gestation of pregnancy: Secondary | ICD-10-CM

## 2015-06-19 DIAGNOSIS — Z87891 Personal history of nicotine dependence: Secondary | ICD-10-CM | POA: Insufficient documentation

## 2015-06-19 DIAGNOSIS — R519 Headache, unspecified: Secondary | ICD-10-CM

## 2015-06-19 DIAGNOSIS — O26893 Other specified pregnancy related conditions, third trimester: Secondary | ICD-10-CM | POA: Insufficient documentation

## 2015-06-19 DIAGNOSIS — O1203 Gestational edema, third trimester: Secondary | ICD-10-CM | POA: Diagnosis present

## 2015-06-19 DIAGNOSIS — R102 Pelvic and perineal pain: Secondary | ICD-10-CM

## 2015-06-19 DIAGNOSIS — N949 Unspecified condition associated with female genital organs and menstrual cycle: Secondary | ICD-10-CM

## 2015-06-19 LAB — URINALYSIS, ROUTINE W REFLEX MICROSCOPIC
BILIRUBIN URINE: NEGATIVE
GLUCOSE, UA: NEGATIVE mg/dL
KETONES UR: NEGATIVE mg/dL
Nitrite: NEGATIVE
PH: 6 (ref 5.0–8.0)
Protein, ur: NEGATIVE mg/dL
Specific Gravity, Urine: 1.025 (ref 1.005–1.030)

## 2015-06-19 LAB — COMPREHENSIVE METABOLIC PANEL
ALBUMIN: 2.5 g/dL — AB (ref 3.5–5.0)
ALK PHOS: 141 U/L — AB (ref 38–126)
ALT: 9 U/L — ABNORMAL LOW (ref 14–54)
ANION GAP: 9 (ref 5–15)
AST: 19 U/L (ref 15–41)
BILIRUBIN TOTAL: 0.6 mg/dL (ref 0.3–1.2)
BUN: 10 mg/dL (ref 6–20)
CALCIUM: 8.5 mg/dL — AB (ref 8.9–10.3)
CO2: 21 mmol/L — ABNORMAL LOW (ref 22–32)
CREATININE: 0.67 mg/dL (ref 0.44–1.00)
Chloride: 107 mmol/L (ref 101–111)
GFR calc Af Amer: >60 mL/min (ref >60)
GFR calc non Af Amer: >60 mL/min (ref >60)
GLUCOSE: 98 mg/dL (ref 65–99)
Potassium: 3.5 mmol/L (ref 3.5–5.1)
Sodium: 137 mmol/L (ref 135–145)
TOTAL PROTEIN: 6.7 g/dL (ref 6.5–8.1)

## 2015-06-19 LAB — CBC
HEMATOCRIT: 26.4 % — AB (ref 36.0–46.0)
HEMOGLOBIN: 8.2 g/dL — AB (ref 12.0–15.0)
MCH: 24.3 pg — AB (ref 26.0–34.0)
MCHC: 31.1 g/dL (ref 30.0–36.0)
MCV: 78.1 fL (ref 78.0–100.0)
Platelets: 190 10*3/uL (ref 150–400)
RBC: 3.38 MIL/uL — ABNORMAL LOW (ref 3.87–5.11)
RDW: 15.7 % — ABNORMAL HIGH (ref 11.5–15.5)
WBC: 7.7 10*3/uL (ref 4.0–10.5)

## 2015-06-19 LAB — PROTEIN / CREATININE RATIO, URINE
Creatinine, Urine: 184 mg/dL
Protein Creatinine Ratio: 0.1 mg/mg{Cre} (ref 0.00–0.15)
Total Protein, Urine: 19 mg/dL

## 2015-06-19 LAB — URINE MICROSCOPIC-ADD ON

## 2015-06-19 LAB — URIC ACID: Uric Acid, Serum: 5.2 mg/dL (ref 2.3–6.6)

## 2015-06-19 MED ORDER — OXYCODONE HCL 5 MG PO TABS
10.0000 mg | ORAL_TABLET | Freq: Once | ORAL | Status: AC
  Administered 2015-06-19: 10 mg via ORAL
  Filled 2015-06-19: qty 2

## 2015-06-19 MED ORDER — BUTALBITAL-APAP-CAFFEINE 50-325-40 MG PO TABS
2.0000 | ORAL_TABLET | Freq: Once | ORAL | Status: AC
  Administered 2015-06-19: 2 via ORAL
  Filled 2015-06-19: qty 2

## 2015-06-19 MED ORDER — ONDANSETRON 8 MG PO TBDP
8.0000 mg | ORAL_TABLET | Freq: Once | ORAL | Status: AC
  Administered 2015-06-19: 8 mg via ORAL
  Filled 2015-06-19: qty 1

## 2015-06-19 NOTE — MAU Note (Signed)
Pt presents to MAU with complaints of contractions, feet swelling for a couple of days. Pt denies any vaginal bleeding or LOF

## 2015-06-19 NOTE — Discharge Instructions (Signed)
Reasons to return to MAU:  1.  Contractions are  5 minutes apart or less, each last 1 minute, these have been going on for 1-2 hours, and you cannot walk or talk during them 2.  You have a large gush of fluid, or a trickle of fluid that will not stop and you have to wear a pad 3.  You have bleeding that is bright red, heavier than spotting--like menstrual bleeding (spotting can be normal in early labor or after a check of your cervix) 4.  You do not feel the baby moving like he/she normally does  General Headache Without Cause A headache is pain or discomfort felt around the head or neck area. The specific cause of a headache may not be found. There are many causes and types of headaches. A few common ones are:  Tension headaches.  Migraine headaches.  Cluster headaches.  Chronic daily headaches. HOME CARE INSTRUCTIONS  Watch your condition for any changes. Take these steps to help with your condition: Managing Pain  Take over-the-counter and prescription medicines only as told by your health care provider.  Lie down in a dark, quiet room when you have a headache.  If directed, apply ice to the head and neck area:  Put ice in a plastic bag.  Place a towel between your skin and the bag.  Leave the ice on for 20 minutes, 2-3 times per day.  Use a heating pad or hot shower to apply heat to the head and neck area as told by your health care provider.  Keep lights dim if bright lights bother you or make your headaches worse. Eating and Drinking  Eat meals on a regular schedule.  Limit alcohol use.  Decrease the amount of caffeine you drink, or stop drinking caffeine. General Instructions  Keep all follow-up visits as told by your health care provider. This is important.  Keep a headache journal to help find out what may trigger your headaches. For example, write down:  What you eat and drink.  How much sleep you get.  Any change to your diet or medicines.  Try  massage or other relaxation techniques.  Limit stress.  Sit up straight, and do not tense your muscles.  Do not use tobacco products, including cigarettes, chewing tobacco, or e-cigarettes. If you need help quitting, ask your health care provider.  Exercise regularly as told by your health care provider.  Sleep on a regular schedule. Get 7-9 hours of sleep, or the amount recommended by your health care provider. SEEK MEDICAL CARE IF:   Your symptoms are not helped by medicine.  You have a headache that is different from the usual headache.  You have nausea or you vomit.  You have a fever. SEEK IMMEDIATE MEDICAL CARE IF:   Your headache becomes severe.  You have repeated vomiting.  You have a stiff neck.  You have a loss of vision.  You have problems with speech.  You have pain in the eye or ear.  You have muscular weakness or loss of muscle control.  You lose your balance or have trouble walking.  You feel faint or pass out.  You have confusion.   This information is not intended to replace advice given to you by your health care provider. Make sure you discuss any questions you have with your health care provider.   Document Released: 01/06/2005 Document Revised: 09/27/2014 Document Reviewed: 05/01/2014 Elsevier Interactive Patient Education [employer]2016 [employer] Inc[employer]

## 2015-06-19 NOTE — MAU Provider Note (Signed)
Chief Complaint:  Contractions  First Provider Initiated Contact with Patient 06/19/15 1927      HPI: Jacqueline Clarke is a 30 y.o. [id number] at [redacted]w[redacted]d who presents to maternity admissions reporting swelling of feet, 8 lb wt gain in past week, HA since yesterday that did not go away w/ Fioricet like previous HA's, sharp epigastric pain and contractions.  Hx of GHTN w/ previous pregnancy, but no elevated BPs so for this pregnancy.   Location: low and upper abd Quality: cramping, sharp Severity: 7/10 in pain scale Duration: < 24 hours Context: None Timing: intermittent Course: Worsening Modifying factors: None Associated signs and symptoms: Pos for contractions. Neg for LOF, VB. Good fetal movement.   Location: Generalized Headache Quality: Pressure Severity: 7/10 in pain scale Duration: ~36 hours Context: None Timing: Constant Course: Worsening Modifying factors: No relief w/ Tylenol, rest. Partial relief w/ Fioricet Associated signs and symptoms: Pos for Sensitivity to light, N/V, epigastric pain. Neg for fever, chills, weakness, difficulties w/ speech or gait.   Past Medical History: Past Medical History  Diagnosis Date  . Asthma   . Scoliosis   . Blood type, Rh negative   . Anemia   . Chronic back pain     scoliosis- on percocet  . Hyperemesis complicating pregnancy, antepartum 2013  . Headache(784.0)   . Cholelithiasis   . Pregnancy induced hypertension   . Obesity   . Depression   . Anxiety   . Infection     hx MRSA; 3 negative tests since    Past obstetric history: OB History  Gravida Para Term Preterm AB SAB TAB Ectopic Multiple Living  6 3 3  0 2 2 0 0  3    # Outcome Date GA Lbr Len/2nd Weight Sex Delivery Anes PTL Lv  6 Current           5 Term 07/22/11 [redacted]w[redacted]d 03:24 / 00:15 8 lb 15.4 oz (4.065 kg) F Vag-Spont EPI  Y     Comments: WNL  4 Term 2011 [redacted]w[redacted]d 04:00 6 lb 14 oz (3.118 kg) M Vag-Spont EPI  Y  3 Term 2010 [redacted]w[redacted]d  7 lb 6 oz (3.345 kg) Genella Mech EPI  Y  2 SAB 2009          1 SAB 2006              Past Surgical History: Past Surgical History  Procedure Laterality Date  . Adenoidectomy    . Nerve, tendon and artery repair Right 06/18/2014    Procedure: NERVE, TENDON AND ARTERY REPAIR;  Surgeon: Bradly Bienenstock, MD;  Location: [facility] OR;  Service: Orthopedics;  Laterality: Right;  . Cholecystectomy    . Adenoidectomy    . Dilation and curettage of uterus  2008  . Arm surgery       Family History: Family History  Problem Relation Age of Onset  . Heart disease Maternal Grandfather   . Diabetes Maternal Grandfather   . Anesthesia problems Neg Hx   . Heart disease Father     Social History: Social History  Substance Use Topics  . Smoking status: Former Smoker -- 0.50 packs/day    Types: Cigarettes    Quit date: 10/18/2014  . Smokeless tobacco: Never Used  . Alcohol Use: No     Comment: Occassionally    Allergies: No Known Allergies  Meds:  Prescriptions prior to admission  Medication Sig Dispense Refill Last Dose  . butalbital-acetaminophen-caffeine (FIORICET) 50-325-40 MG tablet Take 1-2 tablets  by mouth every 6 (six) hours as needed for headache. 45 tablet 4 Past Month at Unknown time  . calcium carbonate (TUMS - DOSED IN MG ELEMENTAL CALCIUM) 500 MG chewable tablet Chew 2 tablets (400 mg of elemental calcium total) by mouth 3 (three) times daily with meals. 120 tablet 6 06/14/2015 at Unknown time  . DICLEGIS 10-10 MG TBEC Take 1 tablet by mouth 3 (three) times daily as needed. For nausea and vomiting  4 Past Month at Unknown time  . omeprazole (PRILOSEC) 20 MG capsule Take 1 capsule (20 mg total) by mouth 2 (two) times daily before a meal. 60 capsule 5 06/14/2015 at Unknown time  . Prenat-FePoly-Metf-FA-DHA-DSS (VITAFOL FE+) 90-1-200 & 50 MG CPPK Take 2 tablets by mouth daily. 60 each 12 06/14/2015 at Unknown time  . zolpidem (AMBIEN) 10 MG tablet Take 1 tablet (10 mg total) by mouth at bedtime as needed for sleep.  30 tablet 0 06/13/2015    I have reviewed patient's Past Medical Hx, Surgical Hx, Family Hx, Social Hx, medications and allergies.   ROS:  Review of Systems  Constitutional: Negative for fever and chills.  HENT: Negative for sinus pressure.   Eyes: Positive for photophobia. Negative for visual disturbance.  Cardiovascular: Positive for leg swelling.  Gastrointestinal: Positive for nausea, vomiting and abdominal pain. Negative for diarrhea and constipation.  Genitourinary: Negative for dysuria and vaginal bleeding.  Musculoskeletal: Negative for gait problem.  Neurological: Positive for light-headedness and headaches. Negative for facial asymmetry, speech difficulty, weakness and numbness.    Physical Exam   Patient Vitals for the past 24 hrs:  BP Temp Pulse Resp Weight  06/19/15 2000 119/80 mmHg - 72 - -  06/19/15 1945 109/92 mmHg - 79 - -  06/19/15 1930 117/70 mmHg - 79 - -  06/19/15 1915 122/76 mmHg - 73 - -  06/19/15 1904 134/78 mmHg - 84 - -  06/19/15 1849 151/90 mmHg 98.2 F (36.8 C) 83 18 (!) 316 lb (143.337 kg)   Constitutional: Well-developed, well-nourished, morbidly obese female in no acute distress.  Cardiovascular: normal rate Respiratory: normal effort GI: Abd soft, non-tender, gravid appropriate for gestational age.  MS: Extremities nontender, 3+ pitting edema, normal ROM. 1 beat clonus bilat Neurologic: Alert and oriented x 4. DTRs 2+. GU: Dilation: 3 Effacement (%): 50 Cervical Position: Posterior Station: -3 Presentation: Vertex Exam by:: Kayren EavesAshley Garvey RN    FHT:  Baseline 135 , moderate variability, accelerations present, no decelerations Contractions: irreg, moderate   Labs: Results for orders placed or performed during the hospital encounter of 06/19/15 (from the past 24 hour(s))  Urinalysis, Routine w reflex microscopic (not [facility])     Status: Abnormal   Collection Time: 06/19/15  6:50 PM  Result Value Ref Range   Color, Urine YELLOW YELLOW    APPearance CLEAR CLEAR   Specific Gravity, Urine 1.025 1.005 - 1.030   pH 6.0 5.0 - 8.0   Glucose, UA NEGATIVE NEGATIVE mg/dL   Hgb urine dipstick SMALL (A) NEGATIVE   Bilirubin Urine NEGATIVE NEGATIVE   Ketones, ur NEGATIVE NEGATIVE mg/dL   Protein, ur NEGATIVE NEGATIVE mg/dL   Nitrite NEGATIVE NEGATIVE   Leukocytes, UA SMALL (A) NEGATIVE  Protein / creatinine ratio, urine     Status: None   Collection Time: 06/19/15  6:50 PM  Result Value Ref Range   Creatinine, Urine 184.00 mg/dL   Total Protein, Urine 19 mg/dL   Protein Creatinine Ratio 0.10 0.00 - 0.15 mg/mg[Cre]  Urine  microscopic-add on     Status: Abnormal   Collection Time: 06/19/15  6:50 PM  Result Value Ref Range   Squamous Epithelial / LPF 0-5 (A) NONE SEEN   WBC, UA 0-5 0 - 5 WBC/hpf   RBC / HPF 0-5 0 - 5 RBC/hpf   Bacteria, UA FEW (A) NONE SEEN  CBC     Status: Abnormal   Collection Time: 06/19/15  7:20 PM  Result Value Ref Range   WBC 7.7 4.0 - 10.5 K/uL   RBC 3.38 (L) 3.87 - 5.11 MIL/uL   Hemoglobin 8.2 (L) 12.0 - 15.0 g/dL   HCT 40.9 (L) 81.1 - 91.4 %   MCV 78.1 78.0 - 100.0 fL   MCH 24.3 (L) 26.0 - 34.0 pg   MCHC 31.1 30.0 - 36.0 g/dL   RDW 78.2 (H) 95.6 - 21.3 %   Platelets 190 150 - 400 K/uL  Comprehensive metabolic panel     Status: Abnormal   Collection Time: 06/19/15  7:20 PM  Result Value Ref Range   Sodium 137 135 - 145 mmol/L   Potassium 3.5 3.5 - 5.1 mmol/L   Chloride 107 101 - 111 mmol/L   CO2 21 (L) 22 - 32 mmol/L   Glucose, Bld 98 65 - 99 mg/dL   BUN 10 6 - 20 mg/dL   Creatinine, Ser 0.86 0.44 - 1.00 mg/dL   Calcium 8.5 (L) 8.9 - 10.3 mg/dL   Total Protein 6.7 6.5 - 8.1 g/dL   Albumin 2.5 (L) 3.5 - 5.0 g/dL   AST 19 15 - 41 U/L   ALT 9 (L) 14 - 54 U/L   Alkaline Phosphatase 141 (H) 38 - 126 U/L   Total Bilirubin 0.6 0.3 - 1.2 mg/dL   GFR calc non Af Amer >60 >60 mL/min   GFR calc Af Amer >60 >60 mL/min   Anion gap 9 5 - 15  Uric acid     Status: None   Collection Time: 06/19/15   7:20 PM  Result Value Ref Range   Uric Acid, Serum 5.2 2.3 - 6.6 mg/dL    Imaging:  No results found.  MAU Course: CBC, CMET, UA, P:C, Fioricet, Zofran.   Care of pt turned over to Sharen Counter, CNM at 2005.  [place], CNM 06/19/2015 7:41 PM   MDM:  BP elevated x 1, then all normal afterwards.  Preeclampsia labs wnl.  No signs of labor, cervix unchanged in 2+ hours in MAU.  Pt h/a improved from 8/10 to 7/10 after Fioricet, nausea resolved completely after Zofran.  Consult Dr Clearance Coots.  Give oxycodone 10 mg dose in MAU x 1.  Pt to f/u in office tomorrow as scheduled.  Pt stable at time of discharge.  A: 1. Headache in pregnancy, antepartum, third trimester   2. Edema during pregnancy in third trimester   3. Pelvic pressure in pregnancy, antepartum, third trimester    P: D/C home with labor precautions and preeclampsia precautions

## 2015-06-20 ENCOUNTER — Ambulatory Visit (INDEPENDENT_AMBULATORY_CARE_PROVIDER_SITE_OTHER): Payer: Medicaid Other | Admitting: Certified Nurse Midwife

## 2015-06-20 VITALS — BP 121/81 | HR 81 | Wt 318.0 lb

## 2015-06-20 DIAGNOSIS — Z3493 Encounter for supervision of normal pregnancy, unspecified, third trimester: Secondary | ICD-10-CM

## 2015-06-20 LAB — POCT URINALYSIS DIPSTICK
Bilirubin, UA: NEGATIVE
Glucose, UA: NEGATIVE
KETONES UA: NEGATIVE
Nitrite, UA: NEGATIVE
PH UA: 6
PROTEIN UA: NEGATIVE
SPEC GRAV UA: 1.015
UROBILINOGEN UA: NEGATIVE

## 2015-06-20 NOTE — Progress Notes (Signed)
Subjective:    Jacqueline Clarke is a 30 y.o. female being seen today for her obstetrical visit. She is at 5463w6d gestation. Patient reports contractions since last night, nothing this am, headache, no bleeding, no leaking, vomiting and edema, reports 8+lb weight gain. Fetal movement: normal.  Problem List Items Addressed This Visit    None    Visit Diagnoses    Prenatal care, third trimester    -  Primary    Relevant Orders    POCT urinalysis dipstick (Completed)      Patient Active Problem List   Diagnosis Date Noted  . Blunt abdominal trauma 05/16/2015  . Hyperemesis gravidarum before end of [redacted] week gestation, dehydration 11/06/2014  . Malnutrition of moderate degree 11/03/2014  . Depressive disorder 12/14/2012  . Normal delivery 07/24/2011    Objective:    BP 121/81 mmHg  Pulse 81  Wt 318 lb (144.244 kg)  LMP 09/09/2014 FHT: 135 BPM  Uterine Size: unable to determine d/t body habitus  Presentations: cephalic  Pelvic Exam:              Dilation: 3cm       Effacement: 50%             Station:  -3    Consistency: soft            Position: posterior     Assessment:    Pregnancy @ 2163w6d weeks   Plan:   Plans for delivery: Vaginal anticipated; labs reviewed; problem list updated Counseling: Consent signed. Infant feeding: plans to breastfeed. Cigarette smoking: never smoked. L&D discussion: symptoms of labor, discussed when to call, discussed what number to call, anesthetic/analgesic options reviewed and delivering clinician:  plans no preference. Postpartum supports and preparation: circumcision discussed and contraception plans discussed.  Follow up in Friday if not delivered.

## 2015-06-20 NOTE — Addendum Note (Signed)
Addended by: Marya LandryFOSTER, Diyan Dave D on: 06/20/2015 11:38 AM   Modules accepted: Orders

## 2015-06-21 ENCOUNTER — Inpatient Hospital Stay (HOSPITAL_COMMUNITY): Payer: Medicaid Other | Admitting: Anesthesiology

## 2015-06-21 ENCOUNTER — Inpatient Hospital Stay (HOSPITAL_COMMUNITY)
Admission: AD | Admit: 2015-06-21 | Discharge: 2015-06-24 | DRG: 775 | Disposition: A | Discharge status: Home / Self Care | Payer: Medicaid Other | Source: Ambulatory Visit | Attending: Obstetrics | Admitting: Obstetrics

## 2015-06-21 ENCOUNTER — Encounter (HOSPITAL_COMMUNITY): Payer: Self-pay

## 2015-06-21 DIAGNOSIS — Z87891 Personal history of nicotine dependence: Secondary | ICD-10-CM

## 2015-06-21 DIAGNOSIS — Z3A40 40 weeks gestation of pregnancy: Secondary | ICD-10-CM

## 2015-06-21 DIAGNOSIS — O99214 Obesity complicating childbirth: Secondary | ICD-10-CM | POA: Diagnosis present

## 2015-06-21 DIAGNOSIS — O9902 Anemia complicating childbirth: Principal | ICD-10-CM | POA: Diagnosis present

## 2015-06-21 DIAGNOSIS — G8929 Other chronic pain: Secondary | ICD-10-CM | POA: Diagnosis present

## 2015-06-21 DIAGNOSIS — Z8614 Personal history of Methicillin resistant Staphylococcus aureus infection: Secondary | ICD-10-CM

## 2015-06-21 DIAGNOSIS — Z23 Encounter for immunization: Secondary | ICD-10-CM

## 2015-06-21 DIAGNOSIS — J45909 Unspecified asthma, uncomplicated: Secondary | ICD-10-CM | POA: Diagnosis present

## 2015-06-21 DIAGNOSIS — Z6841 Body Mass Index (BMI) 40.0 and over, adult: Secondary | ICD-10-CM | POA: Diagnosis not present

## 2015-06-21 DIAGNOSIS — M549 Dorsalgia, unspecified: Secondary | ICD-10-CM | POA: Diagnosis present

## 2015-06-21 LAB — CBC
HEMATOCRIT: 27 % — AB (ref 36.0–46.0)
Hemoglobin: 8.5 g/dL — ABNORMAL LOW (ref 12.0–15.0)
MCH: 24.6 pg — AB (ref 26.0–34.0)
MCHC: 31.5 g/dL (ref 30.0–36.0)
MCV: 78 fL (ref 78.0–100.0)
Platelets: 188 10*3/uL (ref 150–400)
RBC: 3.46 MIL/uL — ABNORMAL LOW (ref 3.87–5.11)
RDW: 15.9 % — AB (ref 11.5–15.5)
WBC: 8.5 10*3/uL (ref 4.0–10.5)

## 2015-06-21 LAB — TYPE AND SCREEN
ABO/RH(D): O NEG
Antibody Screen: NEGATIVE

## 2015-06-21 LAB — POCT FERN TEST: POCT Fern Test: POSITIVE

## 2015-06-21 MED ORDER — PHENYLEPHRINE 40 MCG/ML (10ML) SYRINGE FOR IV PUSH (FOR BLOOD PRESSURE SUPPORT)
80.0000 ug | PREFILLED_SYRINGE | INTRAVENOUS | Status: DC | PRN
Start: 2015-06-21 — End: 2015-06-22
  Filled 2015-06-21: qty 5

## 2015-06-21 MED ORDER — FENTANYL 2.5 MCG/ML BUPIVACAINE 1/10 % EPIDURAL INFUSION (WH - ANES)
14.0000 mL/h | INTRAMUSCULAR | Status: DC | PRN
  Administered 2015-06-21: 14 mL/h via EPIDURAL
  Filled 2015-06-21: qty 125

## 2015-06-21 MED ORDER — OXYTOCIN BOLUS FROM INFUSION
500.0000 mL | INTRAVENOUS | Status: DC
  Administered 2015-06-22: 500 mL via INTRAVENOUS

## 2015-06-21 MED ORDER — ACETAMINOPHEN 325 MG PO TABS: 650.0000 mg | ORAL_TABLET | ORAL | Status: DC | PRN

## 2015-06-21 MED ORDER — OXYCODONE-ACETAMINOPHEN 5-325 MG PO TABS: 2.0000 | ORAL_TABLET | ORAL | Status: DC | PRN

## 2015-06-21 MED ORDER — TERBUTALINE SULFATE 1 MG/ML IJ SOLN
0.2500 mg | Freq: Once | INTRAMUSCULAR | Status: DC | PRN
  Filled 2015-06-21: qty 1

## 2015-06-21 MED ORDER — ONDANSETRON HCL 4 MG/2ML IJ SOLN: 4.0000 mg | Freq: Four times a day (QID) | INTRAMUSCULAR | Status: DC | PRN

## 2015-06-21 MED ORDER — EPHEDRINE 5 MG/ML INJ
10.0000 mg | INTRAVENOUS | Status: DC | PRN
  Filled 2015-06-21: qty 2

## 2015-06-21 MED ORDER — LIDOCAINE HCL (PF) 1 % IJ SOLN
30.0000 mL | INTRAMUSCULAR | Status: DC | PRN
  Filled 2015-06-21: qty 30

## 2015-06-21 MED ORDER — DIPHENHYDRAMINE HCL 50 MG/ML IJ SOLN
12.5000 mg | INTRAMUSCULAR | Status: DC | PRN
  Administered 2015-06-22: 12.5 mg via INTRAVENOUS
  Filled 2015-06-21: qty 1

## 2015-06-21 MED ORDER — OXYTOCIN 40 UNITS IN LACTATED RINGERS INFUSION - SIMPLE MED: 2.5000 [IU]/h | INTRAVENOUS | Status: DC

## 2015-06-21 MED ORDER — OXYTOCIN 40 UNITS IN LACTATED RINGERS INFUSION - SIMPLE MED
1.0000 m[IU]/min | INTRAVENOUS | Status: DC
  Administered 2015-06-21: 2 m[IU]/min via INTRAVENOUS
  Filled 2015-06-21 (×2): qty 1000

## 2015-06-21 MED ORDER — FENTANYL CITRATE (PF) 100 MCG/2ML IJ SOLN: 100.0000 ug | INTRAMUSCULAR | Status: DC | PRN

## 2015-06-21 MED ORDER — LACTATED RINGERS IV SOLN: 500.0000 mL | Freq: Once | INTRAVENOUS | Status: DC

## 2015-06-21 MED ORDER — OXYCODONE-ACETAMINOPHEN 5-325 MG PO TABS: 1.0000 | ORAL_TABLET | ORAL | Status: DC | PRN

## 2015-06-21 MED ORDER — LACTATED RINGERS IV SOLN: 500.0000 mL | INTRAVENOUS | Status: DC | PRN

## 2015-06-21 MED ORDER — SOD CITRATE-CITRIC ACID 500-334 MG/5ML PO SOLN
30.0000 mL | ORAL | Status: DC | PRN
  Administered 2015-06-21: 30 mL via ORAL
  Filled 2015-06-21: qty 15

## 2015-06-21 MED ORDER — PHENYLEPHRINE 40 MCG/ML (10ML) SYRINGE FOR IV PUSH (FOR BLOOD PRESSURE SUPPORT)
80.0000 ug | PREFILLED_SYRINGE | INTRAVENOUS | Status: DC | PRN
  Filled 2015-06-21: qty 10
  Filled 2015-06-21: qty 5

## 2015-06-21 MED ORDER — LACTATED RINGERS IV SOLN
INTRAVENOUS | Status: DC
  Administered 2015-06-22: 1000 mL via INTRAVENOUS

## 2015-06-21 NOTE — MAU Note (Signed)
Urine sent to lab 

## 2015-06-21 NOTE — H&P (Signed)
Yaretsi Erskine SpeedL Goto is a 30 y.o. female presenting for SROM at term. History OB History    Gravida Para Term Preterm AB TAB SAB Ectopic Multiple Living   6 3 3  0 2 0 2 0  3     Past Medical History  Diagnosis Date  . Asthma   . Scoliosis   . Blood type, Rh negative   . Anemia   . Chronic back pain     scoliosis- on percocet  . Hyperemesis complicating pregnancy, antepartum 2013  . Headache(784.0)   . Cholelithiasis   . Pregnancy induced hypertension   . Obesity   . Depression   . Anxiety   . Infection     hx MRSA; 3 negative tests since   Past Surgical History  Procedure Laterality Date  . Adenoidectomy    . Nerve, tendon and artery repair Right 06/18/2014    Procedure: NERVE, TENDON AND ARTERY REPAIR;  Surgeon: Bradly BienenstockFred Ortmann, MD;  Location: [facility] OR;  Service: Orthopedics;  Laterality: Right;  . Cholecystectomy    . Adenoidectomy    . Dilation and curettage of uterus  2008  . Arm surgery     Family History: family history includes Diabetes in her maternal grandfather; Heart disease in her father and maternal grandfather. There is no history of Anesthesia problems. Social History:  reports that she quit smoking about 8 months ago. Her smoking use included Cigarettes. She smoked 0.50 packs per day. She has never used smokeless tobacco. She reports that she does not drink alcohol or use illicit drugs.   Prenatal Transfer Tool  Maternal Diabetes: No Genetic Screening: Normal Maternal Ultrasounds/Referrals: Normal Fetal Ultrasounds or other Referrals:  Referred to Materal Fetal Medicine  Maternal Substance Abuse:  No Significant Maternal Medications:  None Significant Maternal Lab Results:  None Other Comments:  None  ROS  Dilation: 4 Effacement (%): 50 Station: -3 Exam by:: [email or web address] Blood pressure 124/85, pulse 95, temperature 98.6 F (37 C), temperature source Oral, resp. rate 16, height 5\' 10"  (1.778 m), weight 318 lb (144.244 kg), last menstrual period  09/09/2014, SpO2 95 %. Exam Physical Exam  Prenatal labs: ABO, Rh: --/--/O NEG (04/26 1240) Antibody: NEG (04/26 1240) Rubella: 1.03 (10/20 1531) RPR: Non Reactive (03/16 1045)  HBsAg: NEGATIVE (10/20 1531)  HIV: Non Reactive (03/16 1045)  GBS: Negative (05/03 0000)   Assessment/Plan: Admit for SROM at term.  Anticipate NSVD.    Roe Coombsachelle A Denney, CNM 06/21/2015, 4:31 PM

## 2015-06-21 NOTE — Anesthesia Pain Management Evaluation Note (Signed)
  CRNA Pain Management Visit Note  Patient: Jacqueline Clarke, 30 y.o., female  "Hello I am a member of the anesthesia team at [facility]. We have an anesthesia team available at all times to provide care throughout the hospital, including epidural management and anesthesia for C-section. I don't know your plan for the delivery whether it a natural birth, water birth, IV sedation, nitrous supplementation, doula or epidural, but we want to meet your pain goals."   1.Was your pain managed to your expectations on prior hospitalizations?   Yes   2.What is your expectation for pain management during this hospitalization?     Epidural  3.How can we help you reach that goal? Epidural when pain reaches 10  Record the patient's initial score and the patient's pain goal.   Pain: 7  Pain Goal: 10 [facility] wants you to be able to say your pain was always managed very well.  Rica RecordsICKELTON,Jacqueline Woerner 06/21/2015

## 2015-06-21 NOTE — MAU Note (Signed)
Pt c/o fluid leaking starting at 7am. Pt reports contractions every 7 minutes since then. Pt states the baby has been moving less than normal. Pt denies bleeding.

## 2015-06-22 ENCOUNTER — Encounter: Payer: Medicaid Other | Admitting: Certified Nurse Midwife

## 2015-06-22 ENCOUNTER — Encounter (HOSPITAL_COMMUNITY): Payer: Self-pay | Admitting: *Deleted

## 2015-06-22 LAB — RPR: RPR: NONREACTIVE

## 2015-06-22 LAB — URINE CULTURE, OB REFLEX

## 2015-06-22 LAB — CULTURE, OB URINE

## 2015-06-22 MED ORDER — METHYLERGONOVINE MALEATE 0.2 MG/ML IJ SOLN: 0.2000 mg | INTRAMUSCULAR | Status: DC | PRN

## 2015-06-22 MED ORDER — COCONUT OIL OIL
1.0000 "application " | TOPICAL_OIL | Status: DC | PRN
  Administered 2015-06-22: 1 via TOPICAL
  Filled 2015-06-22: qty 120

## 2015-06-22 MED ORDER — ZOLPIDEM TARTRATE 5 MG PO TABS: 5.0000 mg | ORAL_TABLET | Freq: Every evening | ORAL | Status: DC | PRN

## 2015-06-22 MED ORDER — METHYLERGONOVINE MALEATE 0.2 MG PO TABS: 0.2000 mg | ORAL_TABLET | ORAL | Status: DC | PRN

## 2015-06-22 MED ORDER — PNEUMOCOCCAL VAC POLYVALENT 25 MCG/0.5ML IJ INJ
0.5000 mL | INJECTION | INTRAMUSCULAR | Status: DC
  Filled 2015-06-22: qty 0.5

## 2015-06-22 MED ORDER — IBUPROFEN 600 MG PO TABS
600.0000 mg | ORAL_TABLET | Freq: Four times a day (QID) | ORAL | Status: DC
  Administered 2015-06-22 – 2015-06-24 (×8): 600 mg via ORAL
  Filled 2015-06-22 (×8): qty 1

## 2015-06-22 MED ORDER — PHENYLEPHRINE 40 MCG/ML (10ML) SYRINGE FOR IV PUSH (FOR BLOOD PRESSURE SUPPORT)
80.0000 ug | PREFILLED_SYRINGE | INTRAVENOUS | Status: DC | PRN
  Filled 2015-06-22: qty 5

## 2015-06-22 MED ORDER — OXYCODONE-ACETAMINOPHEN 5-325 MG PO TABS
2.0000 | ORAL_TABLET | ORAL | Status: DC | PRN
  Administered 2015-06-23 – 2015-06-24 (×5): 2 via ORAL
  Filled 2015-06-22 (×4): qty 2

## 2015-06-22 MED ORDER — EPHEDRINE 5 MG/ML INJ
10.0000 mg | INTRAVENOUS | Status: DC | PRN
  Filled 2015-06-22: qty 2

## 2015-06-22 MED ORDER — BISACODYL 10 MG RE SUPP: 10.0000 mg | Freq: Every day | RECTAL | Status: DC | PRN

## 2015-06-22 MED ORDER — POLYSACCHARIDE IRON COMPLEX 150 MG PO CAPS
150.0000 mg | ORAL_CAPSULE | Freq: Every day | ORAL | Status: DC
  Administered 2015-06-22 – 2015-06-24 (×3): 150 mg via ORAL
  Filled 2015-06-22 (×3): qty 1

## 2015-06-22 MED ORDER — MEDROXYPROGESTERONE ACETATE 150 MG/ML IM SUSP: 150.0000 mg | INTRAMUSCULAR | Status: DC | PRN

## 2015-06-22 MED ORDER — HYDROCORTISONE ACETATE 25 MG RE SUPP
25.0000 mg | Freq: Two times a day (BID) | RECTAL | Status: DC
  Filled 2015-06-22: qty 1

## 2015-06-22 MED ORDER — PANTOPRAZOLE SODIUM 40 MG PO TBEC
40.0000 mg | DELAYED_RELEASE_TABLET | Freq: Every day | ORAL | Status: DC
  Administered 2015-06-22 – 2015-06-24 (×3): 40 mg via ORAL
  Filled 2015-06-22 (×3): qty 1

## 2015-06-22 MED ORDER — ONDANSETRON HCL 4 MG/2ML IJ SOLN: 4.0000 mg | INTRAMUSCULAR | Status: DC | PRN

## 2015-06-22 MED ORDER — SENNOSIDES-DOCUSATE SODIUM 8.6-50 MG PO TABS
2.0000 | ORAL_TABLET | ORAL | Status: DC
  Administered 2015-06-23 (×2): 2 via ORAL
  Filled 2015-06-22 (×2): qty 2

## 2015-06-22 MED ORDER — ONDANSETRON HCL 4 MG PO TABS: 4.0000 mg | ORAL_TABLET | ORAL | Status: DC | PRN

## 2015-06-22 MED ORDER — ACETAMINOPHEN 325 MG PO TABS: 650.0000 mg | ORAL_TABLET | ORAL | Status: DC | PRN

## 2015-06-22 MED ORDER — TETANUS-DIPHTH-ACELL PERTUSSIS 5-2.5-18.5 LF-MCG/0.5 IM SUSP: 0.5000 mL | Freq: Once | INTRAMUSCULAR | Status: DC

## 2015-06-22 MED ORDER — CALCIUM CARBONATE ANTACID 500 MG PO CHEW
2.0000 | CHEWABLE_TABLET | Freq: Three times a day (TID) | ORAL | Status: DC
Start: 2015-06-22 — End: 2015-06-24
  Administered 2015-06-22 – 2015-06-24 (×6): 400 mg via ORAL
  Filled 2015-06-22 (×6): qty 2

## 2015-06-22 MED ORDER — DIBUCAINE 1 % RE OINT: 1.0000 "application " | TOPICAL_OINTMENT | RECTAL | Status: DC | PRN

## 2015-06-22 MED ORDER — MEASLES, MUMPS & RUBELLA VAC ~~LOC~~ INJ
0.5000 mL | INJECTION | Freq: Once | SUBCUTANEOUS | Status: DC
  Filled 2015-06-22: qty 0.5

## 2015-06-22 MED ORDER — DIPHENHYDRAMINE HCL 25 MG PO CAPS: 25.0000 mg | ORAL_CAPSULE | Freq: Four times a day (QID) | ORAL | Status: DC | PRN

## 2015-06-22 MED ORDER — VITAFOL FE+ 90-1-200 & 50 MG PO CPPK: 2.0000 | ORAL_CAPSULE | Freq: Every day | ORAL | Status: DC

## 2015-06-22 MED ORDER — OXYCODONE-ACETAMINOPHEN 5-325 MG PO TABS
1.0000 | ORAL_TABLET | ORAL | Status: DC | PRN
  Administered 2015-06-22: 1 via ORAL
  Filled 2015-06-22 (×3): qty 1

## 2015-06-22 MED ORDER — BENZOCAINE-MENTHOL 20-0.5 % EX AERO: 1.0000 "application " | INHALATION_SPRAY | CUTANEOUS | Status: DC | PRN

## 2015-06-22 MED ORDER — FERROUS SULFATE 325 (65 FE) MG PO TABS: 325.0000 mg | ORAL_TABLET | Freq: Two times a day (BID) | ORAL | Status: DC

## 2015-06-22 MED ORDER — PRENATAL MULTIVITAMIN CH
1.0000 | ORAL_TABLET | Freq: Every day | ORAL | Status: DC
  Administered 2015-06-22 – 2015-06-23 (×2): 1 via ORAL
  Filled 2015-06-22 (×2): qty 1

## 2015-06-22 MED ORDER — OXYTOCIN 40 UNITS IN LACTATED RINGERS INFUSION - SIMPLE MED: 2.5000 [IU]/h | INTRAVENOUS | Status: DC | PRN

## 2015-06-22 MED ORDER — LIDOCAINE HCL (PF) 1 % IJ SOLN
INTRAMUSCULAR | Status: DC | PRN
  Administered 2015-06-21: 5 mL
  Administered 2015-06-21: 3 mL
  Administered 2015-06-21: 2 mL

## 2015-06-22 MED ORDER — FLEET ENEMA 7-19 GM/118ML RE ENEM: 1.0000 | ENEMA | Freq: Every day | RECTAL | Status: DC | PRN

## 2015-06-22 MED ORDER — LACTATED RINGERS IV SOLN
INTRAVENOUS | Status: DC
  Administered 2015-06-22: 300 mL via INTRAUTERINE
  Administered 2015-06-22: 1000 mL via INTRAUTERINE

## 2015-06-22 MED ORDER — SIMETHICONE 80 MG PO CHEW: 80.0000 mg | CHEWABLE_TABLET | ORAL | Status: DC | PRN

## 2015-06-22 MED ORDER — WITCH HAZEL-GLYCERIN EX PADS: 1.0000 "application " | MEDICATED_PAD | CUTANEOUS | Status: DC | PRN

## 2015-06-22 MED ORDER — LACTATED RINGERS IV SOLN
500.0000 mL | Freq: Once | INTRAVENOUS | Status: AC
  Administered 2015-06-21: 500 mL via INTRAVENOUS

## 2015-06-22 NOTE — Progress Notes (Signed)
MOB was referred for history of depression/anxiety. * Referral screened out by Clinical Social Worker because none of the following criteria appear to apply: ~ History of anxiety/depression during this pregnancy, or of post-partum depression. ~ Diagnosis of anxiety and/or depression within last 3 years OR * MOB's symptoms currently being treated with medication and/or therapy. Please contact the Clinical Social Worker if needs arise, or if MOB requests.   

## 2015-06-22 NOTE — Anesthesia Preprocedure Evaluation (Signed)
Anesthesia Evaluation  Patient identified by MRN, date of birth, ID band Patient awake    Reviewed: Allergy & Precautions, Patient's Chart, lab work & pertinent test results  Airway Mallampati: III  TM Distance: >3 FB     Dental   Pulmonary asthma , former smoker,    Pulmonary exam normal        Cardiovascular negative cardio ROS Normal cardiovascular exam     Neuro/Psych Anxiety Depression negative neurological ROS     GI/Hepatic negative GI ROS, Neg liver ROS,   Endo/Other  Morbid obesity  Renal/GU negative Renal ROS     Musculoskeletal   Abdominal   Peds  Hematology  (+) anemia ,   Anesthesia Other Findings   Reproductive/Obstetrics (+) Pregnancy                             Lab Results  Component Value Date   WBC 8.5 06/21/2015   HGB 8.5* 06/21/2015   HCT 27.0* 06/21/2015   MCV 78.0 06/21/2015   PLT 188 06/21/2015    Anesthesia Physical Anesthesia Plan  ASA: III  Anesthesia Plan: Epidural   Post-op Pain Management:    Induction:   Airway Management Planned: Natural Airway  Additional Equipment:   Intra-op Plan:   Post-operative Plan:   Informed Consent: I have reviewed the patients History and Physical, chart, labs and discussed the procedure including the risks, benefits and alternatives for the proposed anesthesia with the patient or authorized representative who has indicated his/her understanding and acceptance.     Plan Discussed with:   Anesthesia Plan Comments:         Anesthesia Quick Evaluation

## 2015-06-22 NOTE — Anesthesia Postprocedure Evaluation (Signed)
Anesthesia Post Note  Patient: Jacqueline Clarke  Procedure(s) Performed: * No procedures listed *  Patient location during evaluation: Mother Baby Anesthesia Type: Epidural Level of consciousness: awake and alert Pain management: pain level controlled Vital Signs Assessment: post-procedure vital signs reviewed and stable Respiratory status: spontaneous breathing, nonlabored ventilation and respiratory function stable Cardiovascular status: stable Postop Assessment: no headache, no backache and epidural receding Anesthetic complications: no     Last Vitals:  Filed Vitals:   06/22/15 0825 06/22/15 0925  BP: 123/71 115/58  Pulse: 98 98  Temp: 37.2 C 37.2 C  Resp: 18 18    Last Pain:  Filed Vitals:   06/22/15 1607  PainSc: 0-No pain   Pain Goal: Patients Stated Pain Goal: 4 (06/21/15 1600)               Sarabella Caprio

## 2015-06-22 NOTE — Anesthesia Rounding Note (Signed)
  CRNA Epidural Rounding Note  Patient: Jacqueline Clarke, 30 y.o., female  Patient's current pain level: 0  Agreed upon pain management level: 5  Epidural intervention: No   Comments:   [name] 06/22/2015

## 2015-06-22 NOTE — Progress Notes (Signed)
UR chart review completed.  

## 2015-06-22 NOTE — Anesthesia Procedure Notes (Signed)
Epidural Patient location during procedure: OB  Staffing Anesthesiologist: Marcene DuosFITZGERALD, [place] Performed by: anesthesiologist   Preanesthetic Checklist Completed: patient identified, site marked, surgical consent, pre-op evaluation, timeout performed, IV checked, risks and benefits discussed and monitors and equipment checked  Epidural Patient position: sitting Prep: site prepped and draped and DuraPrep Patient monitoring: continuous pulse ox and blood pressure Approach: midline Location: L4-L5 Injection technique: LOR saline  Needle:  Needle type: Tuohy  Needle gauge: 17 G Needle length: 9 cm and 9 Needle insertion depth: 9 cm Catheter type: closed end flexible Catheter size: 19 Gauge Catheter at skin depth: 17 cm Test dose: negative  Assessment Events: blood not aspirated, injection not painful, no injection resistance, negative IV test and no paresthesia

## 2015-06-22 NOTE — Lactation Note (Signed)
This note was copied from a baby's chart. Lactation Consultation Note Initial visit at 11 hours of age.  Mom reports baby wasn't saying latch and putting hand to mouth so she gave her a bottle of formula with 2mls.  Mom reports attempting with 3 older children who didn't latch well and she had pain.  MOm reports she really does want to breastfeed this baby.  LC discussed use of bottle/formula/ and nipple and how it affects baby learning to latch and moms milk supply.  Mom has very large breast with flat nipples and compressible areolas.  LC assisted with football and then laid back nursing positions.  Mom complains of pain with shallow latching, instructed on how to break latch and work on deeper latch.  Baby maintained latch for a few minutes with rhythmic sucking and mom denies pain.  Baby slipped to tip of nipple and mom reports she can't do this.  Baby now asleep on mom content.  Discussed pumping and bottle feeding, mom is considering and will let RN know to set up DEBP if needed.  Mom is alone with baby and her other 3 children at this time and is not ready to commit to pumping.  Mom has WIC and reports her uncle may help her with cost of a DEBP if needed.  Discussed normal pumping volume at first if she pumps and may continue to need to give formula.  Mom to call for assist as needed.  [facility] resources given and discussed. Early newborn behavior discussed.  Hand expression demonstrated with colostrum visible. Encouraged mom to put on her nursing bra to help with positioning.       Patient Name: Jacqueline Clarke ZOXWR'UToday's Date: 06/22/2015 Reason for consult: Initial assessment   Maternal Data Has patient been taught Hand Expression?: Yes Does the patient have breastfeeding experience prior to this delivery?: Yes  Feeding Feeding Type: Breast Fed Length of feed:  (few minutes)  LATCH Score/Interventions Latch: Repeated attempts needed to sustain latch, nipple held in mouth throughout  feeding, stimulation needed to elicit sucking reflex. Intervention(s): Adjust position;Assist with latch;Breast massage;Breast compression  Audible Swallowing: None Intervention(s): Skin to skin;Hand expression  Type of Nipple: Flat  Comfort (Breast/Nipple): Filling, red/small blisters or bruises, mild/mod discomfort  Problem noted: Mild/Moderate discomfort Interventions (Mild/moderate discomfort): Hand expression  Hold (Positioning): Assistance needed to correctly position infant at breast and maintain latch. Intervention(s): Breastfeeding basics reviewed;Support Pillows;Position options;Skin to skin  LATCH Score: 4  Lactation Tools Discussed/Used WIC Program: Yes   Consult Status Consult Status: Follow-up Date: 06/23/15 Follow-up type: In-patient    Jacqueline Clarke, Siona Coulston Lynn 06/22/2015, 4:49 PM

## 2015-06-23 LAB — CBC
HEMATOCRIT: 26.2 % — AB (ref 36.0–46.0)
Hemoglobin: 8.2 g/dL — ABNORMAL LOW (ref 12.0–15.0)
MCH: 24.4 pg — ABNORMAL LOW (ref 26.0–34.0)
MCHC: 31.3 g/dL (ref 30.0–36.0)
MCV: 78 fL (ref 78.0–100.0)
Platelets: 179 10*3/uL (ref 150–400)
RBC: 3.36 MIL/uL — ABNORMAL LOW (ref 3.87–5.11)
RDW: 15.9 % — AB (ref 11.5–15.5)
WBC: 9.4 10*3/uL (ref 4.0–10.5)

## 2015-06-23 MED ORDER — LURASIDONE HCL 40 MG PO TABS: 40.0000 mg | ORAL_TABLET | Freq: Every day | ORAL | Status: DC

## 2015-06-23 MED ORDER — RHO D IMMUNE GLOBULIN 1500 UNIT/2ML IJ SOSY
300.0000 ug | PREFILLED_SYRINGE | Freq: Once | INTRAMUSCULAR | Status: AC
Start: 2015-06-23 — End: 2015-06-23
  Administered 2015-06-23: 300 ug via INTRAMUSCULAR
  Filled 2015-06-23: qty 2

## 2015-06-23 MED ORDER — LURASIDONE HCL 40 MG PO TABS
40.0000 mg | ORAL_TABLET | Freq: Every day | ORAL | Status: DC
  Filled 2015-06-23: qty 1

## 2015-06-23 NOTE — Progress Notes (Signed)
Post Partum Day 1 Subjective: no complaints  Objective: Blood pressure 134/87, pulse 66, temperature 97.9 F (36.6 C), temperature source Oral, resp. rate 18, height 5\' 10"  (1.778 m), weight 318 lb (144.244 kg), last menstrual period 09/09/2014, SpO2 98 %, unknown if currently breastfeeding.  Physical Exam:  General: alert and no distress Lochia: appropriate Uterine Fundus: firm Incision: none DVT Evaluation: No evidence of DVT seen on physical exam.   Recent Labs  06/21/15 1615 06/23/15 0529  HGB 8.5* 8.2*  HCT 27.0* 26.2*    Assessment/Plan: Anemia.  Chronic iron deficiency.  Iron Rx. Plan for discharge tomorrow   LOS: 2 days   HARPER,CHARLES A 06/23/2015, 7:06 AM

## 2015-06-23 NOTE — Lactation Note (Signed)
This note was copied from a baby's chart. Lactation Consultation Note  Patient Name: Jacqueline Clarke ZOXWR'UToday's Date: 06/23/2015 Reason for consult: Follow-up assessment Baby at 39 hr of life. Mom states she is in "the down phase" of bipolar right now. She wants to bf baby but is having trouble coping. She had questions about her medications and bf. Given information from Dr. Martin MajesticHale's book. She has the DEBP, knows how to manually express, and plans to latch baby later tonight. She is aware of lactation services and support groups. She will call as needed.    Maternal Data    Feeding    LATCH Score/Interventions                      Lactation Tools Discussed/Used     Consult Status Consult Status: Follow-up Date: 06/24/15 Follow-up type: In-patient    Jacqueline Clarke 06/23/2015, 9:01 PM

## 2015-06-24 ENCOUNTER — Ambulatory Visit: Payer: Self-pay

## 2015-06-24 ENCOUNTER — Other Ambulatory Visit: Payer: Self-pay | Admitting: Obstetrics

## 2015-06-24 DIAGNOSIS — D509 Iron deficiency anemia, unspecified: Secondary | ICD-10-CM

## 2015-06-24 LAB — RH IG WORKUP (INCLUDES ABO/RH)
ABO/RH(D): O NEG
FETAL SCREEN: NEGATIVE
Gestational Age(Wks): 40
UNIT DIVISION: 0

## 2015-06-24 MED ORDER — IBUPROFEN 600 MG PO TABS: 600.0000 mg | ORAL_TABLET | Freq: Four times a day (QID) | ORAL | Status: DC | PRN

## 2015-06-24 MED ORDER — IRON POLYSACCH CMPLX-B12-FA 150-0.025-1 MG PO CAPS: 1.0000 | ORAL_CAPSULE | Freq: Every day | ORAL | Status: DC

## 2015-06-24 MED ORDER — OXYCODONE-ACETAMINOPHEN 5-325 MG PO TABS: 1.0000 | ORAL_TABLET | ORAL | Status: DC | PRN

## 2015-06-24 NOTE — Progress Notes (Addendum)
RN called Dr Clearance CootsHarper regarding patient's tearful moment witnessed by the tech. Rn asked patient if she was ok. Patient stated she was having a "down moment". Patient stated she has bipolar depression. Rn asked patient if she took medicine for her depression . Mom said she took [place]. Patient said she had taken the medicine about a week ago. The patient stated this to pharmacy. Rn called Dr. Clearance CootsHarper and was able to get a verbal order for [place]una.  A friend came in later. Mom said she was feeling better. She stated earlier she wanted to take the medicine when her husband came in. But after the visit from the friend the patient said she was feeling better. The patient refused the medicine. The FOB brought in three other children and they stayed over night.   Later , Dr Clearance CootsHarper called and inquired how the patient was doing. Rn told Told Dr Clearance CootsHarper the patient refused the medicine.   Dr. Clearance CootsHarper told Rn to tell patient he  encourages the patient to take the medicine. Rn communicated this to patient but patient still refused.

## 2015-06-24 NOTE — Discharge Summary (Signed)
Obstetric Discharge Summary Reason for Admission: rupture of membranes Prenatal Procedures: NST and ultrasound Intrapartum Procedures: spontaneous vaginal delivery Postpartum Procedures: none Complications-Operative and Postpartum: none HEMOGLOBIN  Date Value Ref Range Status  06/23/2015 8.2* 12.0 - 15.0 g/dL Final   HCT  Date Value Ref Range Status  06/23/2015 26.2* 36.0 - 46.0 % Final   HEMATOCRIT  Date Value Ref Range Status  04/05/2015 30.3* 34.0 - 46.6 % Final    Physical Exam:  General: alert and no distress Lochia: appropriate Uterine Fundus: firm Incision: none DVT Evaluation: No evidence of DVT seen on physical exam.  Discharge Diagnoses: Term Pregnancy-delivered                                          Anemia.  Chronic Iron Deficiency.    Discharge Information: Date: 06/24/2015 Activity: pelvic rest Diet: routine Medications: PNV, Ibuprofen, Colace, Iron and Percocet Condition: stable Instructions: refer to practice specific booklet Discharge to: home Follow-up Information    Follow up with Rex Magee A, MD In 2 weeks.   Specialty:  Obstetrics and Gynecology   Contact information:   [address] [place] [phone]       Newborn Data: Live born female  Birth Weight: 9 lb 1.7 oz (4130 g) APGAR: 8, 8  Home with mother.  Nikolis Berent A 06/24/2015, 6:19 AM

## 2015-06-24 NOTE — Lactation Note (Signed)
This note was copied from a baby's chart. Lactation Consultation Note  Mother states her breastfeeding goal is to feed expressed breast milk to the baby but she has not pumped since yesterday.  Explained to her the need to express every 3 hours for 15 minutes. She currently is not in possession of a double electric breast pump but plans to obtain one. Explained and demonstrated to her and the FOB how to use the Medela piston as a double manual pump.  Increased flange size to a #27. Mother was satisfied with this as a short term solution.  Aware of support groups and outpatient services.  Patient Name: Jacqueline Clarke WJXBJ'YToday's Date: 06/24/2015     Maternal Data    Feeding    LATCH Score/Interventions                      Lactation Tools Discussed/Used     Consult Status      Soyla DryerJoseph, Man Bonneau 06/24/2015, 10:49 AM

## 2015-06-24 NOTE — Progress Notes (Addendum)
MOB was referred for history of depression/anxiety. * Referral screened out by Clinical Social Worker because none of the following criteria appear to apply: ~ History of anxiety/depression during this pregnancy, or of post-partum depression. ~ Diagnosis of anxiety and/or depression within last 3 years OR * MOB's symptoms currently being treated with medication and/or therapy. Please contact the Clinical Social Worker if needs arise, or if MOB requests.  Pt disclosed that she had been taking mental health medication until about one week ago and per note, RN called Dr. Harper for verbal order. Pt then refused medication because she reported feeling better. Per chart, pt is aware of her options in terms of medication management and was offered Rx. She is also aware of supportive services and resources.  Jaree Trinka Smart, MSW, LCSW Clinical Social Worker 06/24/2015 8:23 AM  

## 2015-06-24 NOTE — Progress Notes (Signed)
Post Partum Day 2 Subjective: no complaints  Objective: Blood pressure 128/65, pulse 77, temperature 98.4 F (36.9 C), temperature source Oral, resp. rate 18, height 5\' 10"  (1.778 m), weight 318 lb (144.244 kg), last menstrual period 09/09/2014, SpO2 98 %, unknown if currently breastfeeding.  Physical Exam:  General: alert and no distress Lochia: appropriate Uterine Fundus: firm Incision: none DVT Evaluation: No evidence of DVT seen on physical exam.   Recent Labs  06/21/15 1615 06/23/15 0529  HGB 8.5* 8.2*  HCT 27.0* 26.2*    Assessment/Plan: Anemia.  Chronic iron deficiency.  Stable clinically. Discharge home   LOS: 3 days   Jacqueline Clarke A 06/24/2015, 6:12 AM

## 2015-07-05 ENCOUNTER — Ambulatory Visit (INDEPENDENT_AMBULATORY_CARE_PROVIDER_SITE_OTHER): Payer: Medicaid Other | Admitting: Certified Nurse Midwife

## 2015-07-05 ENCOUNTER — Encounter: Payer: Self-pay | Admitting: Certified Nurse Midwife

## 2015-07-05 VITALS — BP 128/85 | HR 74 | Temp 98.6°F | Wt 280.0 lb

## 2015-07-05 DIAGNOSIS — O99345 Other mental disorders complicating the puerperium: Principal | ICD-10-CM

## 2015-07-05 DIAGNOSIS — F419 Anxiety disorder, unspecified: Secondary | ICD-10-CM

## 2015-07-05 DIAGNOSIS — F53 Postpartum depression: Secondary | ICD-10-CM

## 2015-07-05 DIAGNOSIS — F32A Depression, unspecified: Secondary | ICD-10-CM

## 2015-07-05 DIAGNOSIS — F329 Major depressive disorder, single episode, unspecified: Secondary | ICD-10-CM

## 2015-07-05 MED ORDER — BUSPIRONE HCL 15 MG PO TABS: 15.0000 mg | ORAL_TABLET | Freq: Two times a day (BID) | ORAL | Status: DC

## 2015-07-09 DIAGNOSIS — F329 Major depressive disorder, single episode, unspecified: Secondary | ICD-10-CM | POA: Insufficient documentation

## 2015-07-09 DIAGNOSIS — F32A Depression, unspecified: Secondary | ICD-10-CM | POA: Insufficient documentation

## 2015-07-09 NOTE — Progress Notes (Signed)
Patient ID: Jacqueline Clarke, female   DOB: 21-May-1985, 30 y.o.   MRN: [id number]  Subjective:     Jacqueline Clarke is a 30 y.o. female who presents for a postpartum visit. She is 2 weeks postpartum following a spontaneous vaginal delivery. I have fully reviewed the prenatal and intrapartum course. The delivery was at 40 gestational weeks. Outcome: spontaneous vaginal delivery. Anesthesia: epidural. Postpartum course has been normal. Baby's course has been normal. Baby is feeding by breast. Bleeding thin lochia and brown. Bowel function is normal. Bladder function is normal. Patient is not sexually active. Contraception method is abstinence. Postpartum depression screening: depends on the scale GAD: 20.  Denies any suicidal/homicidal ideations. Tobacco, alcohol and substance abuse history reviewed.  Adult immunizations reviewed including TDAP, rubella and varicella.  The following portions of the patient's history were reviewed and updated as appropriate: allergies, current medications, past family history, past medical history, past social history, past surgical history and problem list.  Review of Systems Pertinent items noted in HPI and remainder of comprehensive ROS otherwise negative.   Objective:    BP 128/85 mmHg  Pulse 74  Temp(Src) 98.6 F (37 C)  Wt 280 lb (127.007 kg)  LMP 09/09/2014  General:  alert, cooperative and no distress   Breasts:  inspection negative, no nipple discharge or bleeding, no masses or nodularity palpable  Lungs: clear to auscultation bilaterally  Heart:  regular rate and rhythm, S1, S2 normal, no murmur, click, rub or gallop  Abdomen: soft, non-tender; bowel sounds normal; no masses,  no organomegaly   Vulva:  not evaluated  Vagina: not evaluated  Cervix:  not evaluated  Corpus: not examined  Adnexa:  not evaluated  Rectal Exam: Not performed.          50% of 15 min visit spent on counseling and coordination of care.  Assessment:    Normal 2 week postpartum exam. Pap smear not done at today's visit.    Depression and anxiety   Plan:    1. Contraception: abstinence 2.  Planning POP or LARK 3. Follow up in: 4 weeks or as needed.  2hr GTT for h/o GDM/screening for DM q 3 yrs per ADA recommendations Preconception counseling provided Healthy lifestyle practices reviewed

## 2015-07-12 ENCOUNTER — Encounter (HOSPITAL_COMMUNITY): Payer: Self-pay

## 2015-07-12 ENCOUNTER — Other Ambulatory Visit: Payer: Self-pay | Admitting: Certified Nurse Midwife

## 2015-07-12 ENCOUNTER — Inpatient Hospital Stay (HOSPITAL_COMMUNITY)
Admission: AD | Admit: 2015-07-12 | Discharge: 2015-07-12 | Disposition: A | Discharge status: Home / Self Care | Payer: Medicaid Other | Source: Ambulatory Visit | Attending: Obstetrics | Admitting: Obstetrics

## 2015-07-12 ENCOUNTER — Telehealth: Payer: Self-pay | Admitting: *Deleted

## 2015-07-12 DIAGNOSIS — M419 Scoliosis, unspecified: Secondary | ICD-10-CM | POA: Insufficient documentation

## 2015-07-12 DIAGNOSIS — O9089 Other complications of the puerperium, not elsewhere classified: Secondary | ICD-10-CM | POA: Diagnosis not present

## 2015-07-12 DIAGNOSIS — F329 Major depressive disorder, single episode, unspecified: Secondary | ICD-10-CM | POA: Insufficient documentation

## 2015-07-12 DIAGNOSIS — R030 Elevated blood-pressure reading, without diagnosis of hypertension: Secondary | ICD-10-CM | POA: Diagnosis present

## 2015-07-12 DIAGNOSIS — G44209 Tension-type headache, unspecified, not intractable: Secondary | ICD-10-CM | POA: Diagnosis not present

## 2015-07-12 DIAGNOSIS — IMO0001 Reserved for inherently not codable concepts without codable children: Secondary | ICD-10-CM

## 2015-07-12 DIAGNOSIS — J45909 Unspecified asthma, uncomplicated: Secondary | ICD-10-CM | POA: Insufficient documentation

## 2015-07-12 DIAGNOSIS — R51 Headache: Secondary | ICD-10-CM | POA: Diagnosis present

## 2015-07-12 DIAGNOSIS — Z87891 Personal history of nicotine dependence: Secondary | ICD-10-CM | POA: Insufficient documentation

## 2015-07-12 DIAGNOSIS — F419 Anxiety disorder, unspecified: Secondary | ICD-10-CM | POA: Insufficient documentation

## 2015-07-12 DIAGNOSIS — E669 Obesity, unspecified: Secondary | ICD-10-CM | POA: Insufficient documentation

## 2015-07-12 LAB — COMPREHENSIVE METABOLIC PANEL
ALT: 16 U/L (ref 14–54)
AST: 20 U/L (ref 15–41)
Albumin: 3.6 g/dL (ref 3.5–5.0)
Alkaline Phosphatase: 75 U/L (ref 38–126)
Anion gap: 9 (ref 5–15)
BUN: 11 mg/dL (ref 6–20)
CHLORIDE: 106 mmol/L (ref 101–111)
CO2: 25 mmol/L (ref 22–32)
CREATININE: 0.8 mg/dL (ref 0.44–1.00)
Calcium: 9.2 mg/dL (ref 8.9–10.3)
GFR calc Af Amer: >60 mL/min (ref >60)
GLUCOSE: 92 mg/dL (ref 65–99)
Potassium: 3.5 mmol/L (ref 3.5–5.1)
Sodium: 140 mmol/L (ref 135–145)
Total Bilirubin: 0.7 mg/dL (ref 0.3–1.2)
Total Protein: 7.7 g/dL (ref 6.5–8.1)

## 2015-07-12 LAB — CBC
HEMATOCRIT: 36.1 % (ref 36.0–46.0)
HEMOGLOBIN: 11.3 g/dL — AB (ref 12.0–15.0)
MCH: 25 pg — AB (ref 26.0–34.0)
MCHC: 31.3 g/dL (ref 30.0–36.0)
MCV: 79.9 fL (ref 78.0–100.0)
PLATELETS: 278 10*3/uL (ref 150–400)
RBC: 4.52 MIL/uL (ref 3.87–5.11)
RDW: 17.8 % — ABNORMAL HIGH (ref 11.5–15.5)
WBC: 6.6 10*3/uL (ref 4.0–10.5)

## 2015-07-12 LAB — PROTEIN / CREATININE RATIO, URINE
CREATININE, URINE: 177 mg/dL
Protein Creatinine Ratio: 0.07 mg/mg{Cre} (ref 0.00–0.15)
Total Protein, Urine: 12 mg/dL

## 2015-07-12 MED ORDER — BUTALBITAL-APAP-CAFFEINE 50-325-40 MG PO TABS
2.0000 | ORAL_TABLET | Freq: Once | ORAL | Status: AC
  Administered 2015-07-12: 2 via ORAL
  Filled 2015-07-12: qty 2

## 2015-07-12 MED ORDER — CYCLOBENZAPRINE HCL 5 MG PO TABS: 10.0000 mg | ORAL_TABLET | Freq: Three times a day (TID) | ORAL | Status: DC | PRN

## 2015-07-12 MED ORDER — BUTALBITAL-APAP-CAFFEINE 50-325-40 MG PO TABS: 2.0000 | ORAL_TABLET | Freq: Once | ORAL | Status: DC

## 2015-07-12 MED ORDER — ACETAMINOPHEN 325 MG PO TABS
650.0000 mg | ORAL_TABLET | Freq: Once | ORAL | Status: AC
  Administered 2015-07-12: 650 mg via ORAL
  Filled 2015-07-12: qty 2

## 2015-07-12 NOTE — Telephone Encounter (Signed)
Home nurse called to office regarding Ms Jacqueline Clarke. Nurse states that Ms Jacqueline Clarke is having problems with her anxiety and depression, taking Latuda. Pt scored 22 on [place] depression scale. Pt BP today was 146/104 with HA and Floaters.  Nurse states that pt is having problems breast feeding, has no appetite. Pt is under a lot of stress- home life, needing to work, housing problems.  States that [facility].  Advised nurse f/u will be made with pt regarding plan of care.  Reviewed with provider, RDenney CNM, she [facility].  Call placed to pt. [facility].  [facility] Pt was advised to go as soon as possible due to increase in BP.  Pt states understanding.

## 2015-07-12 NOTE — Discharge Instructions (Signed)

## 2015-07-12 NOTE — MAU Note (Signed)
Vag del 06/02. No BP problems with this past preg, but did with others.  Home nurse was there today.   140/104, called Dr  And was instructed to come in. Has a headache- started about 90min ago, had one yesterday- Ibuporfen helped. Has been seeing spots.  Denies epigastric pain, little swelling.

## 2015-07-12 NOTE — Telephone Encounter (Signed)
Home nurse is calling to report elevated BP along with headache,swelling,floaters. Patient is also scoring a 22 on the depression scale She is requesting that we call her back. Per Cyndia Bentachelle Suzanne [facility] and advised her to go to [facility].  Called [place] and she states she is very concerned about the patient she called her and she is crying. She is at risk for losing her home- lack of support. Emotionally depressed. She was seeing [facility] in the past and Boykin ReaperRachelle is going to try to reach out to her therapist.

## 2015-07-12 NOTE — MAU Provider Note (Signed)
History    CSN: [id number] Arrival date and time: 07/12/15 1648 First Provider Initiated Contact with Patient 07/12/15 1739     Chief Complaint  Patient presents with  . Hypertension  . Headache   HPI Patient is 30 y.o. [id number] here with complaints of  HA with elevated BP. She is PPD#20 (SVD on 6/2). [facility] but this last pregnancy was not.  Reports HA located over eyes. Did not try medications such as tylenol. Patient reports decreased appetite and last ate yesterday, midday day. She reports nausea when she eats. She is currently breast and bottle feeding.   OB History    Gravida Para Term Preterm AB TAB SAB Ectopic Multiple Living   6 4 4  0 2 0 2 0 0 4      Past Medical History  Diagnosis Date  . Asthma   . Scoliosis   . Blood type, Rh negative   . Anemia   . Chronic back pain     scoliosis- on percocet  . Hyperemesis complicating pregnancy, antepartum 2013  . Headache(784.0)   . Cholelithiasis   . Pregnancy induced hypertension   . Obesity   . Depression   . Anxiety   . 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 Adenoidectomy    . Nerve, tendon and artery repair Right 06/18/2014    Procedure: NERVE, TENDON AND ARTERY REPAIR;  Surgeon: Bradly BienenstockFred Ortmann, MD;  Location: [facility] OR;  Service: Orthopedics;  Laterality: Right;  . Cholecystectomy    . Adenoidectomy    . Dilation and curettage of uterus  2008  . Arm surgery      Family History  Problem Relation Age of Onset  . Heart disease Maternal Grandfather   . Diabetes Maternal Grandfather   . Anesthesia problems Neg Hx   . Heart disease Father     Social History  Substance Use Topics  . Smoking status: Former Smoker -- 0.50 packs/day    Types: Cigarettes    Quit date: 10/18/2014  . Smokeless tobacco: Never Used  . Alcohol Use: No     Comment: Occassionally    Allergies: No Known Allergies  Prescriptions prior to  admission  Medication Sig Dispense Refill Last Dose  . busPIRone (BUSPAR) 15 MG tablet Take 1 tablet (15 mg total) by mouth 2 (two) times daily. 60 tablet 5 07/12/2015 at Unknown time  . ibuprofen (ADVIL,MOTRIN) 600 MG tablet Take 1 tablet (600 mg total) by mouth every 6 (six) hours as needed for mild pain. 30 tablet 5 07/12/2015 at Unknown time  . Iron Polysacch Cmplx-B12-FA 150-0.025-1 MG CAPS Take 1 capsule by mouth daily before breakfast. 30 each 5 07/11/2015 at Unknown time  . lurasidone (LATUDA) 40 MG TABS tablet Take 40 mg by mouth daily.   07/11/2015 at Unknown time  . calcium carbonate (TUMS - DOSED IN MG ELEMENTAL CALCIUM) 500 MG chewable tablet Chew 2 tablets (400 mg of elemental calcium total) by mouth 3 (three) times daily with meals. (Patient not taking: Reported on 07/12/2015) 120 tablet 6 Not Taking at Unknown time  . Prenat-FePoly-Metf-FA-DHA-DSS (VITAFOL FE+) 90-1-200 & 50 MG CPPK Take 2 tablets by mouth daily. (Patient not taking: Reported on 07/12/2015) 60 each 12 Not Taking at Unknown time    Review of Systems  Constitutional: Negative for fever and chills.  Eyes: Negative for blurred vision and double vision.  Respiratory:  Negative for cough and shortness of breath.   Cardiovascular: Negative for chest pain and orthopnea.  Gastrointestinal: Negative for nausea and vomiting.  Genitourinary: Negative for dysuria, frequency and flank pain.  Musculoskeletal: Negative for myalgias.  Skin: Negative for rash.  Neurological: Negative for dizziness, tingling, weakness and headaches.  Endo/Heme/Allergies: Does not bruise/bleed easily.  Psychiatric/Behavioral: Negative for depression and suicidal ideas. The patient is not nervous/anxious.    Physical Exam   Blood pressure 138/91, pulse 80, temperature 99.1 F (37.3 C), temperature source Oral, resp. rate 18, currently breastfeeding.  Physical Exam  Nursing note and vitals reviewed. Constitutional: She is oriented to person, place,  and time. She appears well-developed and well-nourished. No distress.  HENT:  Head: Normocephalic and atraumatic.  Eyes: Conjunctivae are normal. No scleral icterus.  Neck: Normal range of motion. Neck supple.  Cardiovascular: Normal rate and intact distal pulses.   Respiratory: Effort normal. She exhibits no tenderness.  GI: Soft. There is no tenderness. There is no rebound and no guarding.  Genitourinary: Vagina normal.  Musculoskeletal: Normal range of motion. She exhibits no edema.  Neurological: She is alert and oriented to person, place, and time.  Skin: Skin is warm and dry. No rash noted.  Psychiatric: She has a normal mood and affect.    MAU Course  Procedures  MDM  Results for orders placed or performed during the hospital encounter of 07/12/15 (from the past 24 hour(s))  Comprehensive metabolic panel     Status: None   Collection Time: 07/12/15  5:20 PM  Result Value Ref Range   Sodium 140 135 - 145 mmol/L   Potassium 3.5 3.5 - 5.1 mmol/L   Chloride 106 101 - 111 mmol/L   CO2 25 22 - 32 mmol/L   Glucose, Bld 92 65 - 99 mg/dL   BUN 11 6 - 20 mg/dL   Creatinine, Ser 9.60 0.44 - 1.00 mg/dL   Calcium 9.2 8.9 - 45.4 mg/dL   Total Protein 7.7 6.5 - 8.1 g/dL   Albumin 3.6 3.5 - 5.0 g/dL   AST 20 15 - 41 U/L   ALT 16 14 - 54 U/L   Alkaline Phosphatase 75 38 - 126 U/L   Total Bilirubin 0.7 0.3 - 1.2 mg/dL   GFR calc non Af Amer >60 >60 mL/min   GFR calc Af Amer >60 >60 mL/min   Anion gap 9 5 - 15  Protein / creatinine ratio, urine     Status: None   Collection Time: 07/12/15  5:20 PM  Result Value Ref Range   Creatinine, Urine 177.00 mg/dL   Total Protein, Urine 12 mg/dL   Protein Creatinine Ratio 0.07 0.00 - 0.15 mg/mg[Cre]  CBC     Status: Abnormal   Collection Time: 07/12/15  5:20 PM  Result Value Ref Range   WBC 6.6 4.0 - 10.5 K/uL   RBC 4.52 3.87 - 5.11 MIL/uL   Hemoglobin 11.3 (L) 12.0 - 15.0 g/dL   HCT 09.8 11.9 - 14.7 %   MCV 79.9 78.0 - 100.0 fL   MCH  25.0 (L) 26.0 - 34.0 pg   MCHC 31.3 30.0 - 36.0 g/dL   RDW 82.9 (H) 56.2 - 13.0 %   Platelets 278 150 - 400 K/uL   7:33 PM HA is not gone. Discharge home.   Assessment and Plan   #Elevated Blood pressure - unlikely preeclampsia given only mild range. Most likely component of chronic HTN or perhaps transient.   - HELLP labs  negative - reviewed s/sx of PreX and unlikely that she would develop PreX twenty days after delivery.   #Headache  - I am not concerned for preeclampsia at this point given her remote nature from delivery. Her HA is most likely related to poor PO intake. She was given a soda, juice and graham crackers.  She was also given Fioricet and rx for flexeril to take at home for this likely tension HA  Federico FlakeKimberly Niles Runell Kovich 07/12/2015, 6:45 PM

## 2015-07-13 NOTE — Telephone Encounter (Signed)
[facility] called to let know about depression uncontrolled, her medications and dosages and that she needs to be seen ASAP.  Patient went to triage for elevated blood pressures.  R.Altair Appenzeller CNM

## 2015-07-19 ENCOUNTER — Ambulatory Visit: Payer: Medicaid Other | Admitting: Certified Nurse Midwife

## 2015-11-29 ENCOUNTER — Inpatient Hospital Stay (HOSPITAL_COMMUNITY)
Admission: AD | Admit: 2015-11-29 | Discharge: 2015-11-29 | Disposition: A | Discharge status: Home / Self Care | Payer: Medicaid Other | Source: Ambulatory Visit | Attending: Obstetrics & Gynecology | Admitting: Obstetrics & Gynecology

## 2015-11-29 ENCOUNTER — Encounter (HOSPITAL_COMMUNITY): Payer: Self-pay | Admitting: *Deleted

## 2015-11-29 DIAGNOSIS — F17210 Nicotine dependence, cigarettes, uncomplicated: Secondary | ICD-10-CM | POA: Diagnosis not present

## 2015-11-29 DIAGNOSIS — O210 Mild hyperemesis gravidarum: Secondary | ICD-10-CM | POA: Insufficient documentation

## 2015-11-29 DIAGNOSIS — R197 Diarrhea, unspecified: Secondary | ICD-10-CM | POA: Insufficient documentation

## 2015-11-29 DIAGNOSIS — Z3A01 Less than 8 weeks gestation of pregnancy: Secondary | ICD-10-CM | POA: Insufficient documentation

## 2015-11-29 DIAGNOSIS — K529 Noninfective gastroenteritis and colitis, unspecified: Secondary | ICD-10-CM | POA: Diagnosis not present

## 2015-11-29 DIAGNOSIS — O99331 Smoking (tobacco) complicating pregnancy, first trimester: Secondary | ICD-10-CM | POA: Diagnosis not present

## 2015-11-29 DIAGNOSIS — M419 Scoliosis, unspecified: Secondary | ICD-10-CM | POA: Diagnosis not present

## 2015-11-29 LAB — COMPREHENSIVE METABOLIC PANEL
ALBUMIN: 3.3 g/dL — AB (ref 3.5–5.0)
ALT: 22 U/L (ref 14–54)
AST: 21 U/L (ref 15–41)
Alkaline Phosphatase: 77 U/L (ref 38–126)
Anion gap: 8 (ref 5–15)
BUN: 10 mg/dL (ref 6–20)
CHLORIDE: 106 mmol/L (ref 101–111)
CO2: 23 mmol/L (ref 22–32)
Calcium: 8.7 mg/dL — ABNORMAL LOW (ref 8.9–10.3)
Creatinine, Ser: 0.72 mg/dL (ref 0.44–1.00)
GFR calc Af Amer: >60 mL/min (ref >60)
GFR calc non Af Amer: >60 mL/min (ref >60)
GLUCOSE: 95 mg/dL (ref 65–99)
POTASSIUM: 4 mmol/L (ref 3.5–5.1)
Sodium: 137 mmol/L (ref 135–145)
Total Bilirubin: 0.9 mg/dL (ref 0.3–1.2)
Total Protein: 6.9 g/dL (ref 6.5–8.1)

## 2015-11-29 LAB — URINALYSIS, ROUTINE W REFLEX MICROSCOPIC
BILIRUBIN URINE: NEGATIVE
GLUCOSE, UA: NEGATIVE mg/dL
Hgb urine dipstick: NEGATIVE
Ketones, ur: NEGATIVE mg/dL
NITRITE: NEGATIVE
PH: 5.5 (ref 5.0–8.0)
Protein, ur: NEGATIVE mg/dL
SPECIFIC GRAVITY, URINE: 1.02 (ref 1.005–1.030)

## 2015-11-29 LAB — URINE MICROSCOPIC-ADD ON: RBC / HPF: NONE SEEN RBC/hpf (ref 0–5)

## 2015-11-29 LAB — CBC
HEMATOCRIT: 36.5 % (ref 36.0–46.0)
Hemoglobin: 11.9 g/dL — ABNORMAL LOW (ref 12.0–15.0)
MCH: 26.4 pg (ref 26.0–34.0)
MCHC: 32.6 g/dL (ref 30.0–36.0)
MCV: 80.9 fL (ref 78.0–100.0)
PLATELETS: 271 10*3/uL (ref 150–400)
RBC: 4.51 MIL/uL (ref 3.87–5.11)
RDW: 16.6 % — AB (ref 11.5–15.5)
WBC: 10.4 10*3/uL (ref 4.0–10.5)

## 2015-11-29 LAB — POCT PREGNANCY, URINE: PREG TEST UR: POSITIVE — AB

## 2015-11-29 MED ORDER — PROMETHAZINE HCL 25 MG PO TABS: 25.0000 mg | ORAL_TABLET | Freq: Four times a day (QID) | ORAL | 0 refills | Status: DC | PRN

## 2015-11-29 MED ORDER — SODIUM CHLORIDE 0.9 % IV SOLN
25.0000 mg | Freq: Once | INTRAVENOUS | Status: AC
  Administered 2015-11-29: 25 mg via INTRAVENOUS
  Filled 2015-11-29: qty 1

## 2015-11-29 NOTE — Discharge Instructions (Signed)
Food Choices to Help Relieve Diarrhea, Adult °When you have diarrhea, the foods you eat and your eating habits are very important. Choosing the right foods and drinks can help relieve diarrhea. Also, because diarrhea can last up to 7 days, you need to replace lost fluids and electrolytes (such as sodium, potassium, and chloride) in order to help prevent dehydration.  °WHAT GENERAL GUIDELINES DO I NEED TO FOLLOW? °· Slowly drink 1 cup (8 oz) of fluid for each episode of diarrhea. If you are getting enough fluid, your urine will be clear or pale yellow. °· Eat starchy foods. Some good choices include white rice, white toast, pasta, low-fiber cereal, baked potatoes (without the skin), saltine crackers, and bagels. °· Avoid large servings of any cooked vegetables. °· Limit fruit to two servings per day. A serving is ½ cup or 1 small piece. °· Choose foods with less than 2 g of fiber per serving. °· Limit fats to less than 8 tsp (38 g) per day. °· Avoid fried foods. °· Eat foods that have probiotics in them. Probiotics can be found in certain dairy products. °· Avoid foods and beverages that may increase the speed at which food moves through the stomach and intestines (gastrointestinal tract). Things to avoid include: °· High-fiber foods, such as dried fruit, raw fruits and vegetables, nuts, seeds, and whole grain foods. °· Spicy foods and high-fat foods. °· Foods and beverages sweetened with high-fructose corn syrup, honey, or sugar alcohols such as xylitol, sorbitol, and mannitol. °WHAT FOODS ARE RECOMMENDED? °Grains °White rice. White, French, or pita breads (fresh or toasted), including plain rolls, buns, or bagels. White pasta. Saltine, soda, or graham crackers. Pretzels. Low-fiber cereal. Cooked cereals made with water (such as cornmeal, farina, or cream cereals). Plain muffins. Matzo. Melba toast. Zwieback.  °Vegetables °Potatoes (without the skin). Strained tomato and vegetable juices. Most well-cooked and canned  vegetables without seeds. Tender lettuce. °Fruits °Cooked or canned applesauce, apricots, cherries, fruit cocktail, grapefruit, peaches, pears, or plums. Fresh bananas, apples without skin, cherries, grapes, cantaloupe, grapefruit, peaches, oranges, or plums.  °Meat and Other Protein Products °Baked or boiled chicken. Eggs. Tofu. Fish. Seafood. Smooth peanut butter. Ground or well-cooked tender beef, ham, veal, lamb, pork, or poultry.  °Dairy °Plain yogurt, kefir, and unsweetened liquid yogurt. Lactose-free milk, buttermilk, or soy milk. Plain hard cheese. °Beverages °Sport drinks. Clear broths. Diluted fruit juices (except prune). Regular, caffeine-free sodas such as ginger ale. Water. Decaffeinated teas. Oral rehydration solutions. Sugar-free beverages not sweetened with sugar alcohols. °Other °Bouillon, broth, or soups made from recommended foods.  °The items listed above may not be a complete list of recommended foods or beverages. Contact your dietitian for more options. °WHAT FOODS ARE NOT RECOMMENDED? °Grains °Whole grain, whole wheat, bran, or rye breads, rolls, pastas, crackers, and cereals. Wild or brown rice. Cereals that contain more than 2 g of fiber per serving. Corn tortillas or taco shells. Cooked or dry oatmeal. Granola. Popcorn. °Vegetables °Raw vegetables. Cabbage, broccoli, Brussels sprouts, artichokes, baked beans, beet greens, corn, kale, legumes, peas, sweet potatoes, and yams. Potato skins. Cooked spinach and cabbage. °Fruits °Dried fruit, including raisins and dates. Raw fruits. Stewed or dried prunes. Fresh apples with skin, apricots, mangoes, pears, raspberries, and strawberries.  °Meat and Other Protein Products °Chunky peanut butter. Nuts and seeds. Beans and lentils. Bacon.  °Dairy °High-fat cheeses. Milk, chocolate milk, and beverages made with milk, such as milk shakes. Cream. Ice cream. °Sweets and Desserts °Sweet rolls, doughnuts, and sweet breads.   Pancakes and waffles. °Fats and  Oils °Butter. Cream sauces. Margarine. Salad oils. Plain salad dressings. Olives. Avocados.  °Beverages °Caffeinated beverages (such as coffee, tea, soda, or energy drinks). Alcoholic beverages. Fruit juices with pulp. Prune juice. Soft drinks sweetened with high-fructose corn syrup or sugar alcohols. °Other °Coconut. Hot sauce. Chili powder. Mayonnaise. Gravy. Cream-based or milk-based soups.  °The items listed above may not be a complete list of foods and beverages to avoid. Contact your dietitian for more information. °WHAT SHOULD I DO IF I BECOME DEHYDRATED? °Diarrhea can sometimes lead to dehydration. Signs of dehydration include dark urine and dry mouth and skin. If you think you are dehydrated, you should rehydrate with an oral rehydration solution. These solutions can be purchased at pharmacies, retail stores, or online.  °Drink ½-1 cup (120-240 mL) of oral rehydration solution each time you have an episode of diarrhea. If drinking this amount makes your diarrhea worse, try drinking smaller amounts more often. For example, drink 1-3 tsp (5-15 mL) every 5-10 minutes.  °A general rule for staying hydrated is to drink 1½-2 L of fluid per day. Talk to your health care provider about the specific amount you should be drinking each day. Drink enough fluids to keep your urine clear or pale yellow. °  °This information is not intended to replace advice given to you by your health care provider. Make sure you discuss any questions you have with your health care provider. °  °Document Released: 03/29/2003 Document Revised: 01/27/2014 Document Reviewed: 11/29/2012 °Elsevier Interactive Patient Education ©2016 [employer] ° °Viral Gastroenteritis °Viral gastroenteritis is also known as stomach flu. This condition affects the stomach and intestinal tract. It can cause sudden diarrhea and vomiting. The illness typically lasts 3 to 8 days. Most people develop an immune response that eventually gets rid of the virus.  While this natural response develops, the virus can make you quite ill. °CAUSES  °Many different viruses can cause gastroenteritis, such as rotavirus or noroviruses. You can catch one of these viruses by consuming contaminated food or water. You may also catch a virus by sharing utensils or other personal items with an infected person or by touching a contaminated surface. °SYMPTOMS  °The most common symptoms are diarrhea and vomiting. These problems can cause a severe loss of body fluids (dehydration) and a body salt (electrolyte) imbalance. Other symptoms may include: °· Fever. °· Headache. °· Fatigue. °· Abdominal pain. °DIAGNOSIS  °Your caregiver can usually diagnose viral gastroenteritis based on your symptoms and a physical exam. A stool sample may also be taken to test for the presence of viruses or other infections. °TREATMENT  °This illness typically goes away on its own. Treatments are aimed at rehydration. The most serious cases of viral gastroenteritis involve vomiting so severely that you are not able to keep fluids down. In these cases, fluids must be given through an intravenous line (IV). °HOME CARE INSTRUCTIONS  °· Drink enough fluids to keep your urine clear or pale yellow. Drink small amounts of fluids frequently and increase the amounts as tolerated. °· Ask your caregiver for specific rehydration instructions. °· Avoid: °¨ Foods high in sugar. °¨ Alcohol. °¨ Carbonated drinks. °¨ Tobacco. °¨ Juice. °¨ Caffeine drinks. °¨ Extremely hot or cold fluids. °¨ Fatty, greasy foods. °¨ Too much intake of anything at one time. °¨ Dairy products until 24 to 48 hours after diarrhea stops. °· You may consume probiotics. Probiotics are active cultures of beneficial bacteria. They may lessen the amount and   number of diarrheal stools in adults. Probiotics can be found in yogurt with active cultures and in supplements. °· Wash your hands well to avoid spreading the virus. °· Only take over-the-counter or  prescription medicines for pain, discomfort, or fever as directed by your caregiver. Do not give aspirin to children. Antidiarrheal medicines are not recommended. °· Ask your caregiver if you should continue to take your regular prescribed and over-the-counter medicines. °· Keep all follow-up appointments as directed by your caregiver. °SEEK IMMEDIATE MEDICAL CARE IF:  °· You are unable to keep fluids down. °· You do not urinate at least once every 6 to 8 hours. °· You develop shortness of breath. °· You notice blood in your stool or vomit. This may look like coffee grounds. °· You have abdominal pain that increases or is concentrated in one small area (localized). °· You have persistent vomiting or diarrhea. °· You have a fever. °· The patient is a child younger than 3 months, and he or she has a fever. °· The patient is a child older than 3 months, and he or she has a fever and persistent symptoms. °· The patient is a child older than 3 months, and he or she has a fever and symptoms suddenly get worse. °· The patient is a baby, and he or she has no tears when crying. °MAKE SURE YOU:  °· Understand these instructions. °· Will watch your condition. °· Will get help right away if you are not doing well or get worse. °  °This information is not intended to replace advice given to you by your health care provider. Make sure you discuss any questions you have with your health care provider. °  °Document Released: 01/06/2005 Document Revised: 03/31/2011 Document Reviewed: 10/23/2010 °Elsevier Interactive Patient Education ©2016 [employer] ° °

## 2015-11-29 NOTE — MAU Note (Signed)
Pt reports she has been vomiting since earlier this morning. Pt unsure if she is pregnant. Pt also stated she started having diarrhea 30 min ago.

## 2015-11-29 NOTE — MAU Provider Note (Signed)
History     CSN: [id number]  Arrival date and time: 11/29/15 1700   First Provider Initiated Contact with Patient 11/29/15 1844      Chief Complaint  Patient presents with  . Emesis   HPI Jacqueline Clarke is a 30 y.o. [id number] at 7530w3d by LMP who presents with n/v/d that began today. Pt reports sudden onset of nausea & vomiting this morning. Reports has vomited "countless" times throughout the day and hasn't been able to keep anything down. Diarrhea began this afternoon. Reports 2 episodes of watery stools this evening. Denies fever/chills, abdominal pain, heartburn, vaginal bleeding. Denies recent travel or sick contacts.   OB History    Gravida Para Term Preterm AB Living   7 4 4  0 2 4   SAB TAB Ectopic Multiple Live Births   2 0 0 0 4      Past Medical History:  Diagnosis Date  . Anemia   . Anxiety   . Asthma   . Blood type, Rh negative   . Cholelithiasis   . Chronic back pain    scoliosis- on percocet  . Depression   . Headache(784.0)   . Hyperemesis complicating pregnancy, antepartum 2013  . 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 Obesity   . Pregnancy induced hypertension   . Scoliosis     Past Surgical History:  Procedure Laterality Date  . ADENOIDECTOMY    . ADENOIDECTOMY    . arm surgery    . CHOLECYSTECTOMY    . DILATION AND CURETTAGE OF UTERUS  2008  . NERVE, TENDON AND ARTERY REPAIR Right 06/18/2014   Procedure: NERVE, TENDON AND ARTERY REPAIR;  Surgeon: Bradly BienenstockFred Ortmann, MD;  Location: [facility] OR;  Service: Orthopedics;  Laterality: Right;    Family History  Problem Relation Age of Onset  . Heart disease Maternal Grandfather   . Diabetes Maternal Grandfather   . Heart disease Father   . Anesthesia problems Neg Hx     Social History  Substance Use Topics  . Smoking status: Current Every Day Smoker    Packs/day: 0.50    Types: Cigarettes  . Smokeless tobacco: Never Used  . Alcohol use No     Comment: Occassionally    Allergies: No  Known Allergies  No prescriptions prior to admission.    Review of Systems  Constitutional: Negative for chills and fever.  Gastrointestinal: Positive for diarrhea, nausea and vomiting. Negative for abdominal pain, blood in stool, constipation and heartburn.  Genitourinary: Negative.    Physical Exam   Blood pressure 107/65, pulse 97, temperature 99.5 F (37.5 C), temperature source Oral, resp. rate 18, height 5\' 11"  (1.803 m), weight 271 lb (122.9 kg), last menstrual period 10/22/2015, currently breastfeeding.  Physical Exam  Nursing note and vitals reviewed. Constitutional: She is oriented to person, place, and time. She appears well-developed and well-nourished. No distress.  HENT:  Head: Normocephalic and atraumatic.  Eyes: Conjunctivae are normal. Right eye exhibits no discharge. Left eye exhibits no discharge. No scleral icterus.  Neck: Normal range of motion.  Cardiovascular: Normal rate, regular rhythm and normal heart sounds.   No murmur heard. Respiratory: Effort normal and breath sounds normal. No respiratory distress. She has no wheezes.  GI: Soft. Bowel sounds are normal. She exhibits no distension. There is generalized tenderness. There is no rigidity, no rebound and no guarding.  Neurological: She is alert and oriented to person, place, and time.  Skin: Skin is warm and  dry. She is not diaphoretic.  Psychiatric: She has a normal mood and affect. Her behavior is normal. Judgment and thought content normal.    MAU Course  Procedures Results for orders placed or performed during the hospital encounter of 11/29/15 (from the past 24 hour(s))  Urinalysis, Routine w reflex microscopic (not at [facility])     Status: Abnormal   Collection Time: 11/29/15  5:40 PM  Result Value Ref Range   Color, Urine YELLOW YELLOW   APPearance CLEAR CLEAR   Specific Gravity, Urine 1.020 1.005 - 1.030   pH 5.5 5.0 - 8.0   Glucose, UA NEGATIVE NEGATIVE mg/dL   Hgb urine dipstick NEGATIVE  NEGATIVE   Bilirubin Urine NEGATIVE NEGATIVE   Ketones, ur NEGATIVE NEGATIVE mg/dL   Protein, ur NEGATIVE NEGATIVE mg/dL   Nitrite NEGATIVE NEGATIVE   Leukocytes, UA TRACE (A) NEGATIVE  Urine microscopic-add on     Status: Abnormal   Collection Time: 11/29/15  5:40 PM  Result Value Ref Range   Squamous Epithelial / LPF 0-5 (A) NONE SEEN   WBC, UA 0-5 0 - 5 WBC/hpf   RBC / HPF NONE SEEN 0 - 5 RBC/hpf   Bacteria, UA FEW (A) NONE SEEN  Pregnancy, urine POC     Status: Abnormal   Collection Time: 11/29/15  6:16 PM  Result Value Ref Range   Preg Test, Ur POSITIVE (A) NEGATIVE  CBC     Status: Abnormal   Collection Time: 11/29/15  7:14 PM  Result Value Ref Range   WBC 10.4 4.0 - 10.5 K/uL   RBC 4.51 3.87 - 5.11 MIL/uL   Hemoglobin 11.9 (L) 12.0 - 15.0 g/dL   HCT 16.136.5 09.636.0 - 04.546.0 %   MCV 80.9 78.0 - 100.0 fL   MCH 26.4 26.0 - 34.0 pg   MCHC 32.6 30.0 - 36.0 g/dL   RDW 40.916.6 (H) 81.111.5 - 91.415.5 %   Platelets 271 150 - 400 K/uL  Comprehensive metabolic panel     Status: Abnormal   Collection Time: 11/29/15  7:14 PM  Result Value Ref Range   Sodium 137 135 - 145 mmol/L   Potassium 4.0 3.5 - 5.1 mmol/L   Chloride 106 101 - 111 mmol/L   CO2 23 22 - 32 mmol/L   Glucose, Bld 95 65 - 99 mg/dL   BUN 10 6 - 20 mg/dL   Creatinine, Ser 7.820.72 0.44 - 1.00 mg/dL   Calcium 8.7 (L) 8.9 - 10.3 mg/dL   Total Protein 6.9 6.5 - 8.1 g/dL   Albumin 3.3 (L) 3.5 - 5.0 g/dL   AST 21 15 - 41 U/L   ALT 22 14 - 54 U/L   Alkaline Phosphatase 77 38 - 126 U/L   Total Bilirubin 0.9 0.3 - 1.2 mg/dL   GFR calc non Af Amer >60 >60 mL/min   GFR calc Af Amer >60 >60 mL/min   Anion gap 8 5 - 15    MDM U/a, CBC, CMP Phenergan 25 mg in bag of LR  Assessment and Plan  A: 1. Acute gastroenteritis    P: Discharge home Rx phenergan Pregnancy verification letter -- [facility] Advance diet as tolerated Discussed reasons to return to MAU  Judeth HornErin Palestine Mosco 11/29/2015, 9:04 PM

## 2015-11-29 NOTE — MAU Provider Note (Signed)
History    Chief Complaint  Patient presents with  . Emesis   [name] is a 30 y.o. [id number] female at 2132w3d by LMP who presents with nausea, vomiting, and diarrhea.  Patient claims she started vomiting around 0700 today.  This afternoon she has been vomiting every 15-20 minutes per her report.  She has been unable to keep down any food or fluids all day.  Patient then developed watery diarrhea 3 or 4 hours ago.   She denies fever and abdominal pain, though she does endorse feeling chilled.  No recent foreign travel or sick contacts.  Patient has tried pepto without relief.  She denies vaginal bleeding and vaginal discharge.  No other complaints at this time.      OB History    Gravida Para Term Preterm AB Living   7 4 4  0 2 4   SAB TAB Ectopic Multiple Live Births   2 0 0 0 4      Past Medical History:  Diagnosis Date  . Anemia   . Anxiety   . Asthma   . Blood type, Rh negative   . Cholelithiasis   . Chronic back pain    scoliosis- on percocet  . Depression   . Headache(784.0)   . Hyperemesis complicating pregnancy, antepartum 2013  . 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 Obesity   . Pregnancy induced hypertension   . Scoliosis     Past Surgical History:  Procedure Laterality Date  . ADENOIDECTOMY    . ADENOIDECTOMY    . arm surgery    . CHOLECYSTECTOMY    . DILATION AND CURETTAGE OF UTERUS  2008  . NERVE, TENDON AND ARTERY REPAIR Right 06/18/2014   Procedure: NERVE, TENDON AND ARTERY REPAIR;  Surgeon: Bradly BienenstockFred Ortmann, MD;  Location: [facility] OR;  Service: Orthopedics;  Laterality: Right;    Family History  Problem Relation Age of Onset  . Heart disease Maternal Grandfather   . Diabetes Maternal Grandfather   . Heart disease Father   . Anesthesia problems Neg Hx     Social History  Substance Use Topics  . Smoking status: Current Every Day Smoker    Packs/day: 0.50    Types: Cigarettes  . Smokeless tobacco: Never Used  . Alcohol use No      Comment: Occassionally    Allergies: No Known Allergies  Prescriptions Prior to Admission  Medication Sig Dispense Refill Last Dose  . busPIRone (BUSPAR) 15 MG tablet Take 1 tablet (15 mg total) by mouth 2 (two) times daily. 60 tablet 5 07/12/2015 at Unknown time  . butalbital-acetaminophen-caffeine (FIORICET, ESGIC) 50-325-40 MG tablet Take 2 tablets by mouth once. 14 tablet 0   . calcium carbonate (TUMS - DOSED IN MG ELEMENTAL CALCIUM) 500 MG chewable tablet Chew 2 tablets (400 mg of elemental calcium total) by mouth 3 (three) times daily with meals. (Patient not taking: Reported on 07/12/2015) 120 tablet 6 Not Taking at Unknown time  . cyclobenzaprine (FLEXERIL) 5 MG tablet Take 2 tablets (10 mg total) by mouth 3 (three) times daily as needed for muscle spasms. 30 tablet 0   . ibuprofen (ADVIL,MOTRIN) 600 MG tablet Take 1 tablet (600 mg total) by mouth every 6 (six) hours as needed for mild pain. 30 tablet 5 07/12/2015 at Unknown time  . Iron Polysacch Cmplx-B12-FA 150-0.025-1 MG CAPS Take 1 capsule by mouth daily before breakfast. 30 each 5 07/11/2015 at Unknown time  . lurasidone (  LATUDA) 40 MG TABS tablet Take 40 mg by mouth daily.   07/11/2015 at Unknown time  . Prenat-FePoly-Metf-FA-DHA-DSS (VITAFOL FE+) 90-1-200 & 50 MG CPPK Take 2 tablets by mouth daily. (Patient not taking: Reported on 07/12/2015) 60 each 12 Not Taking at Unknown time    Review of Systems  Constitutional: Positive for chills. Negative for fever.  HENT: Negative.   Respiratory: Negative.   Cardiovascular: Negative.   Gastrointestinal: Positive for diarrhea, nausea and vomiting. Negative for abdominal pain, blood in stool and constipation.  Genitourinary: Negative for dysuria, frequency and urgency.  Musculoskeletal: Negative.   Skin: Negative.   Neurological: Positive for weakness (generalized weakness).  Psychiatric/Behavioral: Negative.    Physical Exam Blood pressure 111/71, pulse 96, temperature 99.5 F (37.5  C), temperature source Oral, resp. rate 18, height 5\' 11"  (1.803 m), weight 122.9 kg (271 lb), last menstrual period 10/22/2015, currently breastfeeding. Physical Exam  Constitutional: She is oriented to person, place, and time. She appears well-developed and well-nourished. No distress.  Appears uncomfortable  HENT:  Head: Normocephalic and atraumatic.  Neck: Normal range of motion. Neck supple.  Cardiovascular: Normal rate, regular rhythm and normal heart sounds.   No murmur heard. Respiratory: Effort normal. No respiratory distress.  GI: Soft. Bowel sounds are normal. She exhibits no distension. There is tenderness (Mild diffuse abdominal tenderness). There is no rebound and no guarding.  Musculoskeletal: Normal range of motion.  Neurological: She is alert and oriented to person, place, and time.  Skin: Skin is warm and dry.  Psychiatric: She has a normal mood and affect. Her behavior is normal. Judgment and thought content normal.    Results for orders placed or performed during the hospital encounter of 11/29/15 (from the past 24 hour(s))  Urinalysis, Routine w reflex microscopic (not at [facility])     Status: Abnormal   Collection Time: 11/29/15  5:40 PM  Result Value Ref Range   Color, Urine YELLOW YELLOW   APPearance CLEAR CLEAR   Specific Gravity, Urine 1.020 1.005 - 1.030   pH 5.5 5.0 - 8.0   Glucose, UA NEGATIVE NEGATIVE mg/dL   Hgb urine dipstick NEGATIVE NEGATIVE   Bilirubin Urine NEGATIVE NEGATIVE   Ketones, ur NEGATIVE NEGATIVE mg/dL   Protein, ur NEGATIVE NEGATIVE mg/dL   Nitrite NEGATIVE NEGATIVE   Leukocytes, UA TRACE (A) NEGATIVE  Urine microscopic-add on     Status: Abnormal   Collection Time: 11/29/15  5:40 PM  Result Value Ref Range   Squamous Epithelial / LPF 0-5 (A) NONE SEEN   WBC, UA 0-5 0 - 5 WBC/hpf   RBC / HPF NONE SEEN 0 - 5 RBC/hpf   Bacteria, UA FEW (A) NONE SEEN  Pregnancy, urine POC     Status: Abnormal   Collection Time: 11/29/15  6:16 PM   Result Value Ref Range   Preg Test, Ur POSITIVE (A) NEGATIVE  CBC     Status: Abnormal   Collection Time: 11/29/15  7:14 PM  Result Value Ref Range   WBC 10.4 4.0 - 10.5 K/uL   RBC 4.51 3.87 - 5.11 MIL/uL   Hemoglobin 11.9 (L) 12.0 - 15.0 g/dL   HCT 16.1 09.6 - 04.5 %   MCV 80.9 78.0 - 100.0 fL   MCH 26.4 26.0 - 34.0 pg   MCHC 32.6 30.0 - 36.0 g/dL   RDW 40.9 (H) 81.1 - 91.4 %   Platelets 271 150 - 400 K/uL  Comprehensive metabolic panel     Status: Abnormal  Collection Time: 11/29/15  7:14 PM  Result Value Ref Range   Sodium 137 135 - 145 mmol/L   Potassium 4.0 3.5 - 5.1 mmol/L   Chloride 106 101 - 111 mmol/L   CO2 23 22 - 32 mmol/L   Glucose, Bld 95 65 - 99 mg/dL   BUN 10 6 - 20 mg/dL   Creatinine, Ser 4.090.72 0.44 - 1.00 mg/dL   Calcium 8.7 (L) 8.9 - 10.3 mg/dL   Total Protein 6.9 6.5 - 8.1 g/dL   Albumin 3.3 (L) 3.5 - 5.0 g/dL   AST 21 15 - 41 U/L   ALT 22 14 - 54 U/L   Alkaline Phosphatase 77 38 - 126 U/L   Total Bilirubin 0.9 0.3 - 1.2 mg/dL   GFR calc non Af Amer >60 >60 mL/min   GFR calc Af Amer >60 >60 mL/min   Anion gap 8 5 - 15     MAU Course Procedures  MDM UPT UA CBC CMP IV fluids Phenergan 25 mg IV  Patient was placed on contact precautions while in the MAU.  She had active vomiting and was given IV fluids and phenergan once a ride home was assured.  Patient is stable for discharge.      Assessment [name] is a 30 y.o. [id number] female at 5873w3d by LMP who presents with a one day history of nausea, vomiting, and diarrhea likely secondary to viral gastroenteritis.    Plan Stable for discharge home Home Rx for Phenergan 1st trimester precautions given Start prenatal care Work note for the next few days

## 2015-12-15 ENCOUNTER — Inpatient Hospital Stay (HOSPITAL_COMMUNITY)
Admission: AD | Admit: 2015-12-15 | Discharge: 2015-12-15 | Disposition: A | Discharge status: Home / Self Care | Payer: Medicaid Other | Source: Ambulatory Visit | Attending: Obstetrics and Gynecology | Admitting: Obstetrics and Gynecology

## 2015-12-15 ENCOUNTER — Inpatient Hospital Stay (HOSPITAL_COMMUNITY): Payer: Medicaid Other

## 2015-12-15 ENCOUNTER — Encounter (HOSPITAL_COMMUNITY): Payer: Self-pay | Admitting: *Deleted

## 2015-12-15 DIAGNOSIS — O219 Vomiting of pregnancy, unspecified: Secondary | ICD-10-CM | POA: Diagnosis not present

## 2015-12-15 DIAGNOSIS — O30011 Twin pregnancy, monochorionic/monoamniotic, first trimester: Secondary | ICD-10-CM | POA: Diagnosis not present

## 2015-12-15 DIAGNOSIS — R102 Pelvic and perineal pain: Secondary | ICD-10-CM | POA: Diagnosis not present

## 2015-12-15 DIAGNOSIS — Z3A01 Less than 8 weeks gestation of pregnancy: Secondary | ICD-10-CM | POA: Insufficient documentation

## 2015-12-15 DIAGNOSIS — O26891 Other specified pregnancy related conditions, first trimester: Secondary | ICD-10-CM | POA: Diagnosis not present

## 2015-12-15 DIAGNOSIS — O210 Mild hyperemesis gravidarum: Secondary | ICD-10-CM | POA: Diagnosis not present

## 2015-12-15 DIAGNOSIS — O99331 Smoking (tobacco) complicating pregnancy, first trimester: Secondary | ICD-10-CM | POA: Diagnosis not present

## 2015-12-15 DIAGNOSIS — F17210 Nicotine dependence, cigarettes, uncomplicated: Secondary | ICD-10-CM | POA: Insufficient documentation

## 2015-12-15 DIAGNOSIS — IMO0001 Reserved for inherently not codable concepts without codable children: Secondary | ICD-10-CM

## 2015-12-15 LAB — URINE MICROSCOPIC-ADD ON

## 2015-12-15 LAB — CBC
HCT: 35.7 % — ABNORMAL LOW (ref 36.0–46.0)
HEMOGLOBIN: 11.7 g/dL — AB (ref 12.0–15.0)
MCH: 26.4 pg (ref 26.0–34.0)
MCHC: 32.8 g/dL (ref 30.0–36.0)
MCV: 80.6 fL (ref 78.0–100.0)
PLATELETS: 266 10*3/uL (ref 150–400)
RBC: 4.43 MIL/uL (ref 3.87–5.11)
RDW: 16.2 % — ABNORMAL HIGH (ref 11.5–15.5)
WBC: 9.7 10*3/uL (ref 4.0–10.5)

## 2015-12-15 LAB — URINALYSIS, ROUTINE W REFLEX MICROSCOPIC
Bilirubin Urine: NEGATIVE
GLUCOSE, UA: NEGATIVE mg/dL
KETONES UR: NEGATIVE mg/dL
NITRITE: NEGATIVE
PROTEIN: NEGATIVE mg/dL
Specific Gravity, Urine: >1.03 — ABNORMAL HIGH (ref 1.005–1.030)
pH: 6 (ref 5.0–8.0)

## 2015-12-15 LAB — WET PREP, GENITAL
Sperm: NONE SEEN
Trich, Wet Prep: NONE SEEN
YEAST WET PREP: NONE SEEN

## 2015-12-15 LAB — HCG, QUANTITATIVE, PREGNANCY: HCG, BETA CHAIN, QUANT, S: 93362 m[IU]/mL — AB (ref <5)

## 2015-12-15 MED ORDER — METRONIDAZOLE 500 MG PO TABS: 500.0000 mg | ORAL_TABLET | Freq: Three times a day (TID) | ORAL | 0 refills | Status: AC

## 2015-12-15 MED ORDER — PROMETHAZINE HCL 25 MG/ML IJ SOLN
25.0000 mg | Freq: Once | INTRAVENOUS | Status: AC
  Administered 2015-12-15: 25 mg via INTRAVENOUS
  Filled 2015-12-15: qty 1

## 2015-12-15 NOTE — Discharge Instructions (Signed)
Hyperemesis Gravidarum Hyperemesis gravidarum is a severe form of nausea and vomiting that happens during pregnancy. Hyperemesis is worse than morning sickness. It may cause you to have nausea or vomiting all day for many days. It may keep you from eating and drinking enough food and liquids. Hyperemesis usually occurs during the first half (the first 20 weeks) of pregnancy. It often goes away once a woman is in her second half of pregnancy. However, sometimes hyperemesis continues through an entire pregnancy. What are the causes? The cause of this condition is not known. It may be related to changes in chemicals (hormones) in the body during pregnancy, such as the high level of pregnancy hormone (human chorionic gonadotropin) or the increase in the female sex hormone (estrogen). What are the signs or symptoms? Symptoms of this condition include:  Severe nausea and vomiting.  Nausea that does not go away.  Vomiting that does not allow you to keep any food down.  Weight loss.  Body fluid loss (dehydration).  Having no desire to eat, or not liking food that you have previously enjoyed. How is this diagnosed? This condition may be diagnosed based on:  A physical exam.  Your medical history.  Your symptoms.  Blood tests.  Urine tests. How is this treated? This condition may be managed with medicine. If medicines to do not help relieve nausea and vomiting, you may need to receive fluids through an IV tube at the hospital. Follow these instructions at home:  Take over-the-counter and prescription medicines only as told by your health care provider.  Avoid iron pills and multivitamins that contain iron for the first 3-4 months of pregnancy. If you take prescription iron pills, do not stop taking them unless your health care provider approves.  Take the following actions to help prevent nausea and vomiting:  In the morning, before getting out of bed, try eating a couple of dry  crackers or a piece of toast.  Avoid foods and smells that upset your stomach. Fatty and spicy foods may make nausea worse.  Eat 5-6 small meals a day.  Do not drink fluids while eating meals. Drink between meals.  Eat or suck on things that have ginger in them. Ginger can help relieve nausea.  Avoid food preparation. The smell of food can spoil your appetite or trigger nausea.  Follow instructions from your health care provider about eating or drinking restrictions.  For snacks, eat high-protein foods, such as cheese.  Keep all follow-up and pre-birth (prenatal) visits as told by your health care provider. This is important. Contact a health care provider if:  You have pain in your abdomen.  You have a severe headache.  You have vision problems.  You are losing weight. Get help right away if:  You cannot drink fluids without vomiting.  You vomit blood.  You have constant nausea and vomiting.  You are very weak.  You are very thirsty.  You feel dizzy.  You faint.  You have a fever or other symptoms that last for more than 2-3 days.  You have a fever and your symptoms suddenly get worse. Summary  Hyperemesis gravidarum is a severe form of nausea and vomiting that happens during pregnancy.  Making some changes to your eating habits may help relieve nausea and vomiting.  This condition may be managed with medicine.  If medicines to do not help relieve nausea and vomiting, you may need to receive fluids through an IV tube at the hospital. This information is  not intended to replace advice given to you by your health care provider. Make sure you discuss any questions you have with your health care provider. Document Released: 01/06/2005 Document Revised: 09/05/2015 Document Reviewed: 09/05/2015 Elsevier Interactive Patient Education  2017 Else[employer] Inc.   Bacterial Vaginosis Bacterial vaginosis is an infection of the vagina. It happens when too many germs  (bacteria) grow in the vagina. This infection puts you at risk for infections from sex (STIs). Treating this infection can lower your risk for some STIs. You should also treat this if you are pregnant. It can cause your baby to be born early. Follow these instructions at home: Medicines  Take over-the-counter and prescription medicines only as told by your doctor.  Take or use your antibiotic medicine as told by your doctor. Do not stop taking or using it even if you start to feel better. General instructions  If you your sexual partner is a woman, tell her that you have this infection. She needs to get treatment if she has symptoms. If you have a female partner, he does not need to be treated.  During treatment:  Avoid sex.  Do not douche.  Avoid alcohol as told.  Avoid breastfeeding as told.  Drink enough fluid to keep your pee (urine) clear or pale yellow.  Keep your vagina and butt (rectum) clean.  Wash the area with warm water every day.  Wipe from front to back after you use the toilet.  Keep all follow-up visits as told by your doctor. This is important. Preventing this condition  Do not douche.  Use only warm water to wash around your vagina.  Use protection when you have sex. This includes:  Latex condoms.  Dental dams.  Limit how many people you have sex with. It is best to only have sex with the same person (be monogamous).  Get tested for STIs. Have your partner get tested.  Wear underwear that is cotton or lined with cotton.  Avoid tight pants and pantyhose. This is most important in summer.  Do not use any products that have nicotine or tobacco in them. These include cigarettes and e-cigarettes. If you need help quitting, ask your doctor.  Do not use illegal drugs.  Limit how much alcohol you drink. Contact a doctor if:  Your symptoms do not get better, even after you are treated.  You have more discharge or pain when you pee (urinate).  You  have a fever.  You have pain in your belly (abdomen).  You have pain with sex.  Your bleed from your vagina between periods. Summary  This infection happens when too many germs (bacteria) grow in the vagina.  Treating this condition can lower your risk for some infections from sex (STIs).  You should also treat this if you are pregnant. It can cause early (premature) birth.  Do not stop taking or using your antibiotic medicine even if you start to feel better. This information is not intended to replace advice given to you by your health care provider. Make sure you discuss any questions you have with your health care provider. Document Released: 10/16/2007 Document Revised: 09/22/2015 Document Reviewed: 09/22/2015 Elsevier Interactive Patient Education  2017 [employer]

## 2015-12-15 NOTE — MAU Provider Note (Signed)
History     CSN: [id number]  Arrival date and time: 12/15/15 1900   First Provider Initiated Contact with Patient 12/15/15 1936      Chief Complaint  Patient presents with  . Pelvic Pain  . Emesis   Pelvic Pain  The patient's primary symptoms include pelvic pain. This is a new problem. The current episode started today. The problem occurs constantly. The problem has been unchanged. Pain severity now: 9/10  The problem affects both sides. She is pregnant. Associated symptoms include abdominal pain, nausea and vomiting. Pertinent negatives include no chills, constipation, diarrhea, dysuria, fever, frequency or urgency. The vaginal discharge was normal. There has been no bleeding. Nothing aggravates the symptoms. She has tried nothing for the symptoms. Her menstrual history has been regular (LMP 10/22/15 approx).  Emesis   This is a new problem. The current episode started 1 to 4 weeks ago. Episode frequency: twice per hour.  The problem has been unchanged. The emesis has an appearance of stomach contents. There has been no fever. Associated symptoms include abdominal pain. Pertinent negatives include no chills, diarrhea or fever. Risk factors: pregnancy  Treatments tried: phenergan  The treatment provided no relief.    Past Medical History:  Diagnosis Date  . Anemia   . Anxiety   . Asthma   . Blood type, Rh negative   . Cholelithiasis   . Chronic back pain    scoliosis- on percocet  . Depression   . Headache(784.0)   . Hyperemesis complicating pregnancy, antepartum 2013  . 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 Obesity   . Pregnancy induced hypertension   . Scoliosis     Past Surgical History:  Procedure Laterality Date  . ADENOIDECTOMY    . ADENOIDECTOMY    . arm surgery    . CHOLECYSTECTOMY    . DILATION AND CURETTAGE OF UTERUS  2008  . NERVE, TENDON AND ARTERY REPAIR Right 06/18/2014   Procedure: NERVE, TENDON AND ARTERY REPAIR;  Surgeon: Bradly BienenstockFred Ortmann, MD;   Location: [facility] OR;  Service: Orthopedics;  Laterality: Right;    Family History  Problem Relation Age of Onset  . Heart disease Maternal Grandfather   . Diabetes Maternal Grandfather   . Heart disease Father   . Anesthesia problems Neg Hx     Social History  Substance Use Topics  . Smoking status: Current Every Day Smoker    Packs/day: 0.50    Types: Cigarettes  . Smokeless tobacco: Never Used  . Alcohol use No     Comment: Occassionally    Allergies: No Known Allergies  Prescriptions Prior to Admission  Medication Sig Dispense Refill Last Dose  . promethazine (PHENERGAN) 25 MG tablet Take 1 tablet (25 mg total) by mouth every 6 (six) hours as needed for nausea or vomiting. 30 tablet 0 12/15/2015 at 1400    Review of Systems  Constitutional: Negative for chills and fever.  Gastrointestinal: Positive for abdominal pain, nausea and vomiting. Negative for constipation and diarrhea.  Genitourinary: Positive for pelvic pain. Negative for dysuria, frequency and urgency.   Physical Exam   Temperature 98.5 F (36.9 C), resp. rate 18, height 5\' 10"  (1.778 m), weight 271 lb 6.4 oz (123.1 kg), last menstrual period 10/22/2015, currently breastfeeding.  Physical Exam  Nursing note and vitals reviewed. Constitutional: She is oriented to person, place, and time. She appears well-developed and well-nourished. No distress.  HENT:  Head: Normocephalic.  Cardiovascular: Normal rate.   Respiratory:  Effort normal.  GI: Soft. There is no tenderness. There is no rebound.  Neurological: She is alert and oriented to person, place, and time.  Skin: Skin is warm and dry.  Psychiatric: She has a normal mood and affect.    MAU Course  Procedures  MDM  -wet prep-clue cells positive -IV fluids with phenergan -CBC -beta-93,000 -Gonorrhea and chlamydia cultes sent -US    2039: Care [facility] over [facility]ryn Kooistra, CNM Tawnya CrookHogan, Heather Donovan  8:39 PM 12/15/15  Assessment and Plan   Patient Evie Lacksrachelle Route is a [id number] here with complaints of nausea and vomiting. US confirms SIUP twin pregnancy (monochorionic diamnioic). Patient feels better after IV fluids and phenergan; she will make an appointment with [facility] to begin prenatal care. Prescription for Flagyl given and patient instructed to take when she feels better.  Instructions for Unisom and Vitamin B6 given.   Luna KitchensKathryn Kooistra CNM

## 2015-12-15 NOTE — Progress Notes (Signed)
Luna KitchensKathryn Kooistra CNM in earlier to discuss test results and d/c plan with pt. Written and verbal d/c instructions given and understanding voiced.

## 2015-12-15 NOTE — MAU Note (Addendum)
Pain in lower abd and back since Friday night. Getting worse. Denies vag bleeding or d/c. Vomiting for 2 days but unable to keep down water since this am. Have phenergan I took 5 hours ago but not helping

## 2015-12-16 LAB — RPR: RPR Ser Ql: NONREACTIVE

## 2015-12-16 LAB — HIV ANTIBODY (ROUTINE TESTING W REFLEX): HIV Screen 4th Generation wRfx: NONREACTIVE

## 2015-12-19 ENCOUNTER — Encounter (HOSPITAL_COMMUNITY): Payer: Self-pay

## 2015-12-19 ENCOUNTER — Inpatient Hospital Stay (HOSPITAL_COMMUNITY)
Admission: AD | Admit: 2015-12-19 | Discharge: 2015-12-19 | Disposition: A | Discharge status: Home / Self Care | Payer: Medicaid Other | Source: Ambulatory Visit | Attending: Obstetrics & Gynecology | Admitting: Obstetrics & Gynecology

## 2015-12-19 DIAGNOSIS — O30039 Twin pregnancy, monochorionic/diamniotic, unspecified trimester: Secondary | ICD-10-CM | POA: Diagnosis present

## 2015-12-19 DIAGNOSIS — O99331 Smoking (tobacco) complicating pregnancy, first trimester: Secondary | ICD-10-CM | POA: Diagnosis not present

## 2015-12-19 DIAGNOSIS — F17210 Nicotine dependence, cigarettes, uncomplicated: Secondary | ICD-10-CM | POA: Diagnosis not present

## 2015-12-19 DIAGNOSIS — Z3A08 8 weeks gestation of pregnancy: Secondary | ICD-10-CM | POA: Diagnosis not present

## 2015-12-19 DIAGNOSIS — O26892 Other specified pregnancy related conditions, second trimester: Secondary | ICD-10-CM | POA: Diagnosis not present

## 2015-12-19 DIAGNOSIS — M419 Scoliosis, unspecified: Secondary | ICD-10-CM | POA: Insufficient documentation

## 2015-12-19 DIAGNOSIS — Z6791 Unspecified blood type, Rh negative: Secondary | ICD-10-CM | POA: Diagnosis not present

## 2015-12-19 DIAGNOSIS — O209 Hemorrhage in early pregnancy, unspecified: Secondary | ICD-10-CM | POA: Insufficient documentation

## 2015-12-19 DIAGNOSIS — O30031 Twin pregnancy, monochorionic/diamniotic, first trimester: Secondary | ICD-10-CM | POA: Insufficient documentation

## 2015-12-19 DIAGNOSIS — O26891 Other specified pregnancy related conditions, first trimester: Secondary | ICD-10-CM

## 2015-12-19 LAB — GC/CHLAMYDIA PROBE AMP (~~LOC~~) NOT AT ARMC
CHLAMYDIA, DNA PROBE: NEGATIVE
Neisseria Gonorrhea: NEGATIVE

## 2015-12-19 MED ORDER — RHO D IMMUNE GLOBULIN 1500 UNIT/2ML IJ SOSY
300.0000 ug | PREFILLED_SYRINGE | Freq: Once | INTRAMUSCULAR | Status: AC
  Administered 2015-12-19: 300 ug via INTRAMUSCULAR
  Filled 2015-12-19: qty 2

## 2015-12-19 NOTE — MAU Provider Note (Signed)
Chief Complaint: Abdominal Pain and Vaginal Bleeding   First Provider Initiated Contact with Patient 12/19/15 0203     SUBJECTIVE HPI: Jacqueline Clarke is a 30 y.o. [id number] at [redacted]w[redacted]d who presents to [facility] reporting light vaginal bleeding and mild cramping today. Denies passage of clots or tissue. Experienced minor fall yesterday and is concerned that it may have cause the bleeding. Was seen in MAU 12/15/15. Dx'd w/ live twin gestation. Per [place] report twins were mono/mono, but two amnions and yolk sacs were visible and documented on [place] images C/W mono/di twins. Pelvic exam, wet prep, cultures done.     Blood type O NEG  Location: suprapubic Quality: cramping Severity: mild Duration: <12 hours Context: after fall Timing: intermittent Modifying factors: None. Hasn't needed anything for the pain Associated signs and symptoms: Pos for VB. Neg for passage of clots or tissue, LOF, fever, chills, GI complaints or urinary complaints.   Past Medical History:  Diagnosis Date  . Anemia   . Anxiety   . Asthma   . Blood type, Rh negative   . Cholelithiasis   . Chronic back pain    scoliosis- on percocet  . Depression   . Headache(784.0)   . Hyperemesis complicating pregnancy, antepartum 2013  . 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 Obesity   . Pregnancy induced hypertension   . Scoliosis    OB History  Gravida Para Term Preterm AB Living  7 4 4  0 2 4  SAB TAB Ectopic Multiple Live Births  2 0 0 0 4    # Outcome Date GA Lbr Len/2nd Weight Sex Delivery Anes PTL Lv  7 Current           6 Term 06/22/15 [redacted]w[redacted]d 07:35 / 00:18 9 lb 1.7 oz (4.13 kg) F Vag-Spont EPI  LIV  5 Term 07/22/11 [redacted]w[redacted]d 03:24 / 00:15 8 lb 15.4 oz (4.065 kg) F Vag-Spont EPI  LIV     Birth Comments: WNL  4 Term 2011 [redacted]w[redacted]d 04:00 6 lb 14 oz (3.118 kg) M Vag-Spont EPI  LIV  3 Term 2010 [redacted]w[redacted]d  7 lb 6 oz (3.345 kg) M Vag-Spont EPI  LIV  2 SAB 2009          1 SAB 2006             Past Surgical  History:  Procedure Laterality Date  . ADENOIDECTOMY    . ADENOIDECTOMY    . arm surgery    . CHOLECYSTECTOMY    . DILATION AND CURETTAGE OF UTERUS  2008  . NERVE, TENDON AND ARTERY REPAIR Right 06/18/2014   Procedure: NERVE, TENDON AND ARTERY REPAIR;  Surgeon: Bradly Bienenstock, MD;  Location: [facility] OR;  Service: Orthopedics;  Laterality: Right;   Social History   Social History  . Marital status: Single    Spouse name: N/A  . Number of children: N/A  . Years of education: N/A   Occupational History  . Not on file.   Social History Main Topics  . Smoking status: Current Every Day Smoker    Packs/day: 0.50    Types: Cigarettes  . Smokeless tobacco: Never Used  . Alcohol use No     Comment: Occassionally  . Drug use: No  . Sexual activity: Yes     Comment: nexplanon being removed 08-21-14   Other Topics Concern  . Not on file   Social History Narrative   ** Merged History Encounter **  Family History  Problem Relation Age of Onset  . Heart disease Maternal Grandfather   . Diabetes Maternal Grandfather   . Heart disease Father   . Anesthesia problems Neg Hx    No current facility-administered medications on file prior to encounter.    Current Outpatient Prescriptions on File Prior to Encounter  Medication Sig Dispense Refill  . metroNIDAZOLE (FLAGYL) 500 MG tablet Take 1 tablet (500 mg total) by mouth 3 (three) times daily. 21 tablet 0  . promethazine (PHENERGAN) 25 MG tablet Take 1 tablet (25 mg total) by mouth every 6 (six) hours as needed for nausea or vomiting. 30 tablet 0   No Known Allergies  I have reviewed patient's Past Medical Hx, Surgical Hx, Family Hx, Social Hx, medications and allergies.   Review of Systems  Constitutional: Negative for chills and fever.  Gastrointestinal: Positive for abdominal pain. Negative for abdominal distention, diarrhea, nausea and vomiting.  Genitourinary: Positive for vaginal bleeding. Negative for difficulty urinating,  dysuria, hematuria and vaginal discharge.  Musculoskeletal: Negative for back pain.    OBJECTIVE Patient Vitals for the past 24 hrs:  BP Temp Temp src Pulse Resp  12/19/15 0048 108/69 98 F (36.7 C) Oral 83 18   Constitutional: Well-developed, well-nourished female in no acute distress.  Cardiovascular: normal rate Respiratory: normal rate and effort.  GI: Abd soft, non-tender. MS: Extremities nontender, no edema, normal ROM Neurologic: Alert and oriented x 4.  GU: Declined  LAB RESULTS Results for orders placed or performed during the hospital encounter of 12/19/15 (from the past 24 hour(s))  Rh IG workup (includes ABO/Rh)     Status: None (Preliminary result)   Collection Time: 12/19/15  1:29 AM  Result Value Ref Range   Gestational Age(Wks) 22.3    ABO/RH(D) O NEG    Antibody Screen NEG    Fetal Screen NEG    Unit Number [id number]    Blood Component Type RHIG    Unit division 00    Status of Unit ISSUED    Transfusion Status OK TO TRANSFUSE     IMAGING Informal BS US shows live twin IUP. FHR 154/158. YS seen x 2.   MAU COURSE Orders Placed This Encounter  Procedures  . Urinalysis, Routine w reflex microscopic ([facility])  . Rh IG workup (includes ABO/Rh)   Meds ordered this encounter  Medications  . rho (d) immune globulin (RHIG/RHOPHYLAC) injection 300 mcg   Asked sonographer to review US images and report. Will ask radiologist to review images and addend report as needed.    MDM - Light VB possibly from fall. No evidence of SAB in progress.  - Live twin IUP.   ASSESSMENT 1. Vaginal bleeding in pregnancy, first trimester   2. Rh negative state in antepartum period, first trimester   3. Monochorionic diamniotic twin gestation in first trimester     PLAN Discharge home in stable condition. Bleeding precautions. Pelvic rest x 1 week.  Follow-up Information    [facility]/2015.   Specialty:  Obstetrics  and Gynecology Why:  For new OB appointment Contact information: [address] [place] [id number] [phone]       [facility] Follow up.   Why:  As needed in emergencies Contact information: [address] [id number] [place] [phone]           Medication List    TAKE these medications  metroNIDAZOLE 500 MG tablet Commonly known as:  FLAGYL Take 1 tablet (500 mg total) by mouth 3 (three) times daily.   promethazine 25 MG tablet Commonly known as:  PHENERGAN Take 1 tablet (25 mg total) by mouth every 6 (six) hours as needed for nausea or vomiting.        [place] Aarianna Hoadley, CNM 12/19/2015  3:13 AM

## 2015-12-19 NOTE — Discharge Instructions (Signed)
Vaginal Bleeding During Pregnancy, First Trimester °A small amount of bleeding (spotting) from the vagina is relatively common in early pregnancy. It usually stops on its own. Various things may cause bleeding or spotting in early pregnancy. Some bleeding may be related to the pregnancy, and some may not. In most cases, the bleeding is normal and is not a problem. However, bleeding can also be a sign of something serious. Be sure to tell your health care provider about any vaginal bleeding right away. °Some possible causes of vaginal bleeding during the first trimester include: °· Infection or inflammation of the cervix. °· Growths (polyps) on the cervix. °· Miscarriage or threatened miscarriage. °· Pregnancy tissue has developed outside of the uterus and in a fallopian tube (tubal pregnancy). °· Tiny cysts have developed in the uterus instead of pregnancy tissue (molar pregnancy). °Follow these instructions at home: °Watch your condition for any changes. The following actions may help to lessen any discomfort you are feeling: °· Follow your health care provider's instructions for limiting your activity. If your health care provider orders bed rest, you may need to stay in bed and only get up to use the bathroom. However, your health care provider may allow you to continue light activity. °· If needed, make plans for someone to help with your regular activities and responsibilities while you are on bed rest. °· Keep track of the number of pads you use each day, how often you change pads, and how soaked (saturated) they are. Write this down. °· Do not use tampons. Do not douche. °· Do not have sexual intercourse or orgasms until approved by your health care provider. °· If you pass any tissue from your vagina, save the tissue so you can show it to your health care provider. °· Only take over-the-counter or prescription medicines as directed by your health care provider. °· Do not take aspirin because it can make you  bleed. °· Keep all follow-up appointments as directed by your health care provider. °Contact a health care provider if: °· You have any vaginal bleeding during any part of your pregnancy. °· You have cramps or labor pains. °· You have a fever, not controlled by medicine. °Get help right away if: °· You have severe cramps in your back or belly (abdomen). °· You pass large clots or tissue from your vagina. °· Your bleeding increases. °· You feel light-headed or weak, or you have fainting episodes. °· You have chills. °· You are leaking fluid or have a gush of fluid from your vagina. °· You pass out while having a bowel movement. °This information is not intended to replace advice given to you by your health care provider. Make sure you discuss any questions you have with your health care provider. °Document Released: 10/16/2004 Document Revised: 06/14/2015 Document Reviewed: 09/13/2012 °Elsevier Interactive Patient Education © 2017 [employer]vier Inc. ° °

## 2015-12-19 NOTE — MAU Note (Signed)
Pt presents complaining of lower abdominal pain and vaginal bleeding that started yesterday after a fall. States the bleeding is brown now. Last intercourse 2 days ago.

## 2015-12-20 LAB — RH IG WORKUP (INCLUDES ABO/RH)
ABO/RH(D): O NEG
ANTIBODY SCREEN: NEGATIVE
Fetal Screen: NEGATIVE
GESTATIONAL AGE(WKS): 22.3
Unit division: 0

## 2015-12-26 ENCOUNTER — Other Ambulatory Visit (HOSPITAL_COMMUNITY)
Admission: RE | Admit: 2015-12-26 | Discharge: 2015-12-26 | Disposition: A | Discharge status: Home / Self Care | Payer: Medicaid Other | Source: Ambulatory Visit | Attending: Obstetrics | Admitting: Obstetrics

## 2015-12-26 ENCOUNTER — Encounter: Payer: Self-pay | Admitting: Certified Nurse Midwife

## 2015-12-26 ENCOUNTER — Ambulatory Visit (INDEPENDENT_AMBULATORY_CARE_PROVIDER_SITE_OTHER): Payer: Medicaid Other | Admitting: Certified Nurse Midwife

## 2015-12-26 VITALS — BP 115/78 | HR 78 | Wt 268.0 lb

## 2015-12-26 DIAGNOSIS — Z01411 Encounter for gynecological examination (general) (routine) with abnormal findings: Secondary | ICD-10-CM | POA: Diagnosis present

## 2015-12-26 DIAGNOSIS — O219 Vomiting of pregnancy, unspecified: Secondary | ICD-10-CM | POA: Diagnosis not present

## 2015-12-26 DIAGNOSIS — Z3491 Encounter for supervision of normal pregnancy, unspecified, first trimester: Secondary | ICD-10-CM | POA: Diagnosis not present

## 2015-12-26 DIAGNOSIS — O0991 Supervision of high risk pregnancy, unspecified, first trimester: Secondary | ICD-10-CM

## 2015-12-26 DIAGNOSIS — Z3490 Encounter for supervision of normal pregnancy, unspecified, unspecified trimester: Secondary | ICD-10-CM

## 2015-12-26 DIAGNOSIS — O0990 Supervision of high risk pregnancy, unspecified, unspecified trimester: Secondary | ICD-10-CM | POA: Insufficient documentation

## 2015-12-26 LAB — POCT URINALYSIS DIPSTICK
Bilirubin, UA: NEGATIVE
Blood, UA: NEGATIVE
Glucose, UA: NEGATIVE
Ketones, UA: NEGATIVE
Nitrite, UA: NEGATIVE
Spec Grav, UA: 1.015
Urobilinogen, UA: NEGATIVE
pH, UA: 6

## 2015-12-26 MED ORDER — PROMETHAZINE HCL 25 MG RE SUPP: 25.0000 mg | Freq: Four times a day (QID) | RECTAL | 0 refills | Status: DC | PRN

## 2015-12-26 MED ORDER — DOXYLAMINE-PYRIDOXINE 10-10 MG PO TBEC: DELAYED_RELEASE_TABLET | ORAL | 4 refills | Status: DC

## 2015-12-26 NOTE — Progress Notes (Signed)
Subjective:    Jacqueline Clarke is being seen today for her first obstetrical visit.  This is not a planned pregnancy. She is at 575w4d gestation. Her obstetrical history is significant for twin pregnancy, obesity, asthma, chronic back pain, hyperemesis, PIH, scoliosis, close spaced pregnancies. Relationship with FOB: significant other, living together. Patient does intend to breast feed. Pregnancy history fully reviewed.  The information documented in the HPI was reviewed and verified.  Menstrual History: OB History    Gravida Para Term Preterm AB Living   7 4 4  0 2 4   SAB TAB Ectopic Multiple Live Births   2 0 0 0 4       Patient's last menstrual period was 10/20/2015 (approximate).    Past Medical History:  Diagnosis Date  . Anemia   . Anxiety   . Asthma   . Blood type, Rh negative   . Cholelithiasis   . Chronic back pain    scoliosis- on percocet  . Depression   . Headache(784.0)   . Hyperemesis complicating pregnancy, antepartum 2013  . 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 Obesity   . Pregnancy induced hypertension   . Scoliosis     Past Surgical History:  Procedure Laterality Date  . ADENOIDECTOMY    . ADENOIDECTOMY    . arm surgery    . CHOLECYSTECTOMY    . DILATION AND CURETTAGE OF UTERUS  2008  . NERVE, TENDON AND ARTERY REPAIR Right 06/18/2014   Procedure: NERVE, TENDON AND ARTERY REPAIR;  Surgeon: Bradly BienenstockFred Ortmann, MD;  Location: [facility] OR;  Service: Orthopedics;  Laterality: Right;     (Not in a hospital admission) No Known Allergies  Social History  Substance Use Topics  . Smoking status: Former Smoker    Packs/day: 0.50    Types: Cigarettes  . Smokeless tobacco: Never Used  . Alcohol use No     Comment: Occassionally    Family History  Problem Relation Age of Onset  . Heart disease Maternal Grandfather   . Diabetes Maternal Grandfather   . Heart disease Father   . Anesthesia problems Neg Hx      Review of Systems Constitutional:  negative for weight loss Gastrointestinal: + for nausea & vomiting Genitourinary:negative for genital lesions and vaginal discharge and dysuria Musculoskeletal:negative for back pain Behavioral/Psych: negative for abusive relationship, depression, illegal drug usage and tobacco use    Objective:    BP 115/78   Pulse 78   Wt 268 lb (121.6 kg)   LMP 10/20/2015 (Approximate)   BMI 38.45 kg/m  General Appearance:    Alert, cooperative, no distress, appears stated age  Head:    Normocephalic, without obvious abnormality, atraumatic  Eyes:    PERRL, conjunctiva/corneas clear, EOM's intact, fundi    benign, both eyes  Ears:    Normal TM's and external ear canals, both ears  Nose:   Nares normal, septum midline, mucosa normal, no drainage    or sinus tenderness  Throat:   Lips, mucosa, and tongue normal; teeth and gums normal  Neck:   Supple, symmetrical, trachea midline, no adenopathy;    thyroid:  no enlargement/tenderness/nodules; no carotid   bruit or JVD  Back:     Symmetric, no curvature, ROM normal, no CVA tenderness  Lungs:     Clear to auscultation bilaterally, respirations unlabored  Chest Wall:    No tenderness or deformity   Heart:    Regular rate and rhythm, S1 and  S2 normal, no murmur, rub   or gallop  Breast Exam:    No tenderness, masses, or nipple abnormality  Abdomen:     Soft, non-tender, bowel sounds active all four quadrants,    no masses, no organomegaly  Genitalia:    Normal female without lesion, discharge or tenderness  Extremities:   Extremities normal, atraumatic, no cyanosis or edema  Pulses:   2+ and symmetric all extremities  Skin:   Skin color, texture, turgor normal, no rashes or lesions  Lymph nodes:   Cervical, supraclavicular, and axillary nodes normal  Neurologic:   CNII-XII intact, normal strength, sensation and reflexes    throughout                                Cervix: short on clinical exam.  +Fetal heart rates with sonosite X2.   Lab  Review Urine pregnancy test Labs reviewed yes Radiologic studies reviewed yes Assessment:    Pregnancy at 10059w4d weeks   Twin pregnancy  N&V in early pregnancy  Plan:      Prenatal vitamins.  Counseling provided regarding continued use of seat belts, cessation of alcohol consumption, smoking or use of illicit drugs; infection precautions i.e., influenza/TDAP immunizations, toxoplasmosis,CMV, parvovirus, listeria and varicella; workplace safety, exercise during pregnancy; routine dental care, safe medications, sexual activity, hot tubs, saunas, pools, travel, caffeine use, fish and methlymercury, potential toxins, hair treatments, varicose veins Weight gain recommendations per IOM guidelines reviewed: underweight/BMI< 18.5--> gain 28 - 40 lbs; normal weight/BMI 18.5 - 24.9--> gain 25 - 35 lbs; overweight/BMI 25 - 29.9--> gain 15 - 25 lbs; obese/BMI >30->gain  11 - 20 lbs Problem list reviewed and updated. FIRST/CF mutation testing/NIPT/QUAD SCREEN/fragile X/Ashkenazi Jewish population testing/Spinal muscular atrophy discussed: [place] planned. Role of ultrasound in pregnancy discussed; fetal survey: requested. Amniocentesis discussed: not indicated. VBAC calculator score: VBAC consent form provided Meds ordered this encounter  Medications  . Doxylamine-Pyridoxine (DICLEGIS) 10-10 MG TBEC    Sig: Take 1 tablet with breakfast and lunch.  Take 2 tablets at bedtime.    Dispense:  100 tablet    Refill:  4   Orders Placed This Encounter  Procedures  . Culture, OB Urine  . Hemoglobinopathy evaluation  . Varicella zoster antibody, IgG  . VITAMIN D 25 Hydroxy (Vit-D Deficiency, Fractures)  . Cystic Fibrosis Mutation 97  . Obstetric Panel, Including HIV  . ToxASSURE Select 13 (MW), Urine  . Hemoglobin A1c    Follow up in 4 weeks. 50% of 30 min visit spent on counseling and coordination of care.

## 2015-12-26 NOTE — Addendum Note (Signed)
Addended by: Marya LandryFOSTER, Kelsey Edman D on: 12/26/2015 04:29 PM   Modules accepted: Orders

## 2015-12-27 ENCOUNTER — Encounter: Payer: Self-pay | Admitting: Certified Nurse Midwife

## 2015-12-28 LAB — URINE CULTURE, OB REFLEX

## 2015-12-28 LAB — CULTURE, OB URINE

## 2015-12-29 LAB — NUSWAB VG+, CANDIDA 6SP
ATOPOBIUM VAGINAE: HIGH {score} — AB
BVAB 2: HIGH {score} — AB
CANDIDA GLABRATA, NAA: NEGATIVE
CANDIDA KRUSEI, NAA: NEGATIVE
CANDIDA PARAPSILOSIS, NAA: NEGATIVE
CANDIDA TROPICALIS, NAA: NEGATIVE
Candida albicans, NAA: NEGATIVE
Candida lusitaniae, NAA: NEGATIVE
Chlamydia trachomatis, NAA: NEGATIVE
MEGASPHAERA 1: HIGH {score} — AB
Neisseria gonorrhoeae, NAA: NEGATIVE
TRICH VAG BY NAA: NEGATIVE

## 2015-12-31 LAB — CYTOLOGY - PAP: Diagnosis: NEGATIVE

## 2016-01-01 ENCOUNTER — Ambulatory Visit (HOSPITAL_COMMUNITY)
Admission: RE | Admit: 2016-01-01 | Discharge: 2016-01-01 | Disposition: A | Discharge status: Home / Self Care | Payer: Medicaid Other | Source: Ambulatory Visit | Attending: Certified Nurse Midwife | Admitting: Certified Nurse Midwife

## 2016-01-01 ENCOUNTER — Other Ambulatory Visit: Payer: Self-pay | Admitting: Certified Nurse Midwife

## 2016-01-01 DIAGNOSIS — O0991 Supervision of high risk pregnancy, unspecified, first trimester: Secondary | ICD-10-CM

## 2016-01-01 DIAGNOSIS — Z3A10 10 weeks gestation of pregnancy: Secondary | ICD-10-CM | POA: Insufficient documentation

## 2016-01-01 DIAGNOSIS — O26872 Cervical shortening, second trimester: Secondary | ICD-10-CM | POA: Diagnosis present

## 2016-01-01 DIAGNOSIS — O30001 Twin pregnancy, unspecified number of placenta and unspecified number of amniotic sacs, first trimester: Secondary | ICD-10-CM

## 2016-01-01 DIAGNOSIS — O30031 Twin pregnancy, monochorionic/diamniotic, first trimester: Secondary | ICD-10-CM | POA: Insufficient documentation

## 2016-01-01 LAB — AB SCR+ANTIBODY ID: ANTIBODY SCREEN: POSITIVE — AB

## 2016-01-01 LAB — OBSTETRIC PANEL, INCLUDING HIV
BASOS ABS: 0 10*3/uL (ref 0.0–0.2)
Basos: 0 %
EOS (ABSOLUTE): 0.1 10*3/uL (ref 0.0–0.4)
Eos: 1 %
HEP B S AG: NEGATIVE
HIV SCREEN 4TH GENERATION: NONREACTIVE
Hematocrit: 36.9 % (ref 34.0–46.6)
Hemoglobin: 12 g/dL (ref 11.1–15.9)
IMMATURE GRANULOCYTES: 0 %
Immature Grans (Abs): 0 10*3/uL (ref 0.0–0.1)
LYMPHS ABS: 2.7 10*3/uL (ref 0.7–3.1)
Lymphs: 31 %
MCH: 26.5 pg — AB (ref 26.6–33.0)
MCHC: 32.5 g/dL (ref 31.5–35.7)
MCV: 82 fL (ref 79–97)
Monocytes Absolute: 0.7 10*3/uL (ref 0.1–0.9)
Monocytes: 8 %
NEUTROS PCT: 60 %
Neutrophils Absolute: 5.2 10*3/uL (ref 1.4–7.0)
PLATELETS: 288 10*3/uL (ref 150–379)
RBC: 4.52 x10E6/uL (ref 3.77–5.28)
RDW: 15.6 % — AB (ref 12.3–15.4)
RPR: NONREACTIVE
Rh Factor: NEGATIVE
Rubella Antibodies, IGG: 1.09 index (ref >0.99)
WBC: 8.8 10*3/uL (ref 3.4–10.8)

## 2016-01-01 LAB — HEMOGLOBINOPATHY EVALUATION
HGB C: 0 %
HGB S: 0 %
Hemoglobin A2 Quantitation: 2.7 % (ref 0.7–3.1)
Hemoglobin F Quantitation: 0 % (ref 0.0–2.0)
Hgb A: 97.3 % (ref 94.0–98.0)

## 2016-01-01 LAB — CYSTIC FIBROSIS MUTATION 97: Interpretation: NOT DETECTED

## 2016-01-01 LAB — VARICELLA ZOSTER ANTIBODY, IGG: VARICELLA: 961 {index} (ref >165)

## 2016-01-01 LAB — HEMOGLOBIN A1C
ESTIMATED AVERAGE GLUCOSE: 103 mg/dL
HEMOGLOBIN A1C: 5.2 % (ref 4.8–5.6)

## 2016-01-01 LAB — VITAMIN D 25 HYDROXY (VIT D DEFICIENCY, FRACTURES): VIT D 25 HYDROXY: 13.8 ng/mL — AB (ref 30.0–100.0)

## 2016-01-02 LAB — TOXASSURE SELECT 13 (MW), URINE

## 2016-01-03 ENCOUNTER — Other Ambulatory Visit: Payer: Self-pay | Admitting: Certified Nurse Midwife

## 2016-01-03 DIAGNOSIS — R7989 Other specified abnormal findings of blood chemistry: Secondary | ICD-10-CM

## 2016-01-03 DIAGNOSIS — B9689 Other specified bacterial agents as the cause of diseases classified elsewhere: Secondary | ICD-10-CM

## 2016-01-03 DIAGNOSIS — N760 Acute vaginitis: Secondary | ICD-10-CM

## 2016-01-03 DIAGNOSIS — O360110 Maternal care for anti-D [Rh] antibodies, first trimester, not applicable or unspecified: Secondary | ICD-10-CM

## 2016-01-03 DIAGNOSIS — O360190 Maternal care for anti-D [Rh] antibodies, unspecified trimester, not applicable or unspecified: Secondary | ICD-10-CM | POA: Insufficient documentation

## 2016-01-03 HISTORY — DX: Other specified abnormal findings of blood chemistry: R79.89

## 2016-01-03 MED ORDER — VITAMIN D (ERGOCALCIFEROL) 1.25 MG (50000 UNIT) PO CAPS: 50000.0000 [IU] | ORAL_CAPSULE | ORAL | 2 refills | Status: DC

## 2016-01-03 MED ORDER — METRONIDAZOLE 0.75 % VA GEL: 1.0000 | Freq: Two times a day (BID) | VAGINAL | 0 refills | Status: DC

## 2016-01-04 ENCOUNTER — Other Ambulatory Visit: Payer: Self-pay | Admitting: Certified Nurse Midwife

## 2016-01-12 ENCOUNTER — Encounter (HOSPITAL_COMMUNITY): Payer: Self-pay | Admitting: *Deleted

## 2016-01-12 ENCOUNTER — Inpatient Hospital Stay (HOSPITAL_COMMUNITY)
Admission: AD | Admit: 2016-01-12 | Discharge: 2016-01-12 | Disposition: A | Discharge status: Home / Self Care | Payer: Medicaid Other | Source: Ambulatory Visit | Attending: Obstetrics & Gynecology | Admitting: Obstetrics & Gynecology

## 2016-01-12 DIAGNOSIS — M419 Scoliosis, unspecified: Secondary | ICD-10-CM | POA: Diagnosis not present

## 2016-01-12 DIAGNOSIS — R109 Unspecified abdominal pain: Secondary | ICD-10-CM | POA: Diagnosis not present

## 2016-01-12 DIAGNOSIS — O30031 Twin pregnancy, monochorionic/diamniotic, first trimester: Secondary | ICD-10-CM | POA: Diagnosis not present

## 2016-01-12 DIAGNOSIS — Z3A12 12 weeks gestation of pregnancy: Secondary | ICD-10-CM | POA: Insufficient documentation

## 2016-01-12 DIAGNOSIS — O26891 Other specified pregnancy related conditions, first trimester: Secondary | ICD-10-CM | POA: Diagnosis not present

## 2016-01-12 DIAGNOSIS — O219 Vomiting of pregnancy, unspecified: Secondary | ICD-10-CM | POA: Insufficient documentation

## 2016-01-12 LAB — URINALYSIS, MICROSCOPIC (REFLEX)

## 2016-01-12 LAB — URINALYSIS, ROUTINE W REFLEX MICROSCOPIC
BILIRUBIN URINE: NEGATIVE
GLUCOSE, UA: NEGATIVE mg/dL
Hgb urine dipstick: NEGATIVE
KETONES UR: NEGATIVE mg/dL
NITRITE: NEGATIVE
PH: 6.5 (ref 5.0–8.0)
Protein, ur: NEGATIVE mg/dL
SPECIFIC GRAVITY, URINE: 1.02 (ref 1.005–1.030)

## 2016-01-12 MED ORDER — PROMETHAZINE HCL 25 MG/ML IJ SOLN
25.0000 mg | Freq: Once | INTRAMUSCULAR | Status: AC
  Administered 2016-01-12: 25 mg via INTRAVENOUS
  Filled 2016-01-12: qty 1

## 2016-01-12 MED ORDER — DEXTROSE 5 % IN LACTATED RINGERS IV BOLUS
1000.0000 mL | Freq: Once | INTRAVENOUS | Status: AC
Start: 2016-01-12 — End: 2016-01-12
  Administered 2016-01-12: 1000 mL via INTRAVENOUS

## 2016-01-12 NOTE — MAU Note (Signed)
Given diclegis for n/v but for past 4 days nothing has been helping.  Went to have BM and pee and noticed a gush of blood but nothing since

## 2016-01-12 NOTE — MAU Provider Note (Signed)
History     CSN: [id number]  Arrival date and time: 01/12/16 1744   None     Chief Complaint  Patient presents with  . Emesis During Pregnancy  . Vaginal Bleeding  . vaginal pressure   HPI   [name] is a 30 y.o. female [phone] @ 7523w0d Mono/di twins here in MAU with N/V. She is currently taking diclegis at the max dose. She has vomited more than 8 times today. She saw one episode of spotting during urination only. No active bleeding at this time.   OB History    Gravida Para Term Preterm AB Living   7 4 4  0 2 4   SAB TAB Ectopic Multiple Live Births   2 0 0 0 4      Past Medical History:  Diagnosis Date  . Anemia   . Anxiety   . Asthma   . Blood type, Rh negative   . Cholelithiasis   . Chronic back pain    scoliosis- on percocet  . Depression   . Headache(784.0)   . Hyperemesis complicating pregnancy, antepartum 2013  . 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 Obesity   . Pregnancy induced hypertension   . Scoliosis     Past Surgical History:  Procedure Laterality Date  . ADENOIDECTOMY    . ADENOIDECTOMY    . arm surgery    . CHOLECYSTECTOMY    . DILATION AND CURETTAGE OF UTERUS  2008  . NERVE, TENDON AND ARTERY REPAIR Right 06/18/2014   Procedure: NERVE, TENDON AND ARTERY REPAIR;  Surgeon: Bradly BienenstockFred Ortmann, MD;  Location: [facility] OR;  Service: Orthopedics;  Laterality: Right;    Family History  Problem Relation Age of Onset  . Heart disease Maternal Grandfather   . Diabetes Maternal Grandfather   . Heart disease Father   . Anesthesia problems Neg Hx     Social History  Substance Use Topics  . Smoking status: Former Smoker    Packs/day: 0.50    Types: Cigarettes  . Smokeless tobacco: Never Used  . Alcohol use No     Comment: Occassionally    Allergies: No Known Allergies  Prescriptions Prior to Admission  Medication Sig Dispense Refill Last Dose  . Doxylamine-Pyridoxine (DICLEGIS) 10-10 MG TBEC Take 1 tablet with breakfast  and lunch.  Take 2 tablets at bedtime. 100 tablet 4   . metroNIDAZOLE (METROGEL VAGINAL) 0.75 % vaginal gel Place 1 Applicatorful vaginally 2 (two) times daily. 70 g 0   . promethazine (PHENERGAN) 25 MG suppository Place 1 suppository (25 mg total) rectally every 6 (six) hours as needed for nausea or vomiting. 12 each 0   . Vitamin D, Ergocalciferol, (DRISDOL) 50000 units CAPS capsule Take 1 capsule (50,000 Units total) by mouth every 7 (seven) days. 30 capsule 2    Results for orders placed or performed during the hospital encounter of 01/12/16 (from the past 48 hour(s))  Urinalysis, Routine w reflex microscopic     Status: Abnormal   Collection Time: 01/12/16  6:51 PM  Result Value Ref Range   Color, Urine YELLOW YELLOW   APPearance CLOUDY (A) CLEAR   Specific Gravity, Urine 1.020 1.005 - 1.030   pH 6.5 5.0 - 8.0   Glucose, UA NEGATIVE NEGATIVE mg/dL   Hgb urine dipstick NEGATIVE NEGATIVE   Bilirubin Urine NEGATIVE NEGATIVE   Ketones, ur NEGATIVE NEGATIVE mg/dL   Protein, ur NEGATIVE NEGATIVE mg/dL   Nitrite NEGATIVE NEGATIVE  Leukocytes, UA SMALL (A) NEGATIVE  Urinalysis, Microscopic (reflex)     Status: Abnormal   Collection Time: 01/12/16  6:51 PM  Result Value Ref Range   RBC / HPF 0-5 0 - 5 RBC/hpf   WBC, UA 0-5 0 - 5 WBC/hpf   Bacteria, UA FEW (A) NONE SEEN   Squamous Epithelial / LPF 0-5 (A) NONE SEEN   Amorphous Crystal PRESENT     Review of Systems  Gastrointestinal: Positive for abdominal pain (Mild abdominal cramping ), nausea and vomiting.   Physical Exam   Height 5\' 10"  (1.778 m), weight 274 lb 12.8 oz (124.6 kg), last menstrual period 10/20/2015, currently breastfeeding.  Physical Exam  Constitutional: She is oriented to person, place, and time. She appears well-developed and well-nourished.  Non-toxic appearance. She does not have a sickly appearance. She does not appear ill. No distress.  HENT:  Head: Normocephalic.  Genitourinary:  Genitourinary Comments:  Cervix: closed, thick, posterior. No bleeding noted.   Musculoskeletal: Normal range of motion.  Neurological: She is alert and oriented to person, place, and time.  Skin: Skin is warm. She is not diaphoretic.  Psychiatric: Her behavior is normal.    MAU Course  Procedures  None  MDM  D5LR bolus X 1 Phenergan 25 mg IV PO challenge:pass Patient received rhogam on 11/29 due to vaginal bleeding following a mild fall.  Bedside US done by myself and confirmed with Wynelle BourgeoisMarie Williams reveals + fetal heart rates; two active fetus X 2 Urine without signs of dehydration.   Assessment and Plan   A:  1. Abdominal pain in pregnancy, first trimester   2. Nausea and vomiting in pregnancy     P:  Discharge home in stable condition Return to MAU if symptoms worsen Small, frequent meals Continue phenergan as needed as directed   Duane LopeJennifer I Azzam Mehra, NP 01/12/2016 7:42 PM

## 2016-01-12 NOTE — Discharge Instructions (Signed)
Abdominal Pain During Pregnancy Belly (abdominal) pain is common during pregnancy. Most of the time, it is not a serious problem. Other times, it can be a sign that something is wrong with the pregnancy. Always tell your doctor if you have belly pain. Follow these instructions at home: Monitor your belly pain for any changes. The following actions may help you feel better:  Do not have sex (intercourse) or put anything in your vagina until you feel better.  Rest until your pain stops.  Drink clear fluids if you feel sick to your stomach (nauseous). Do not eat solid food until you feel better.  Only take medicine as told by your doctor.  Keep all doctor visits as told. Get help right away if:  You are bleeding, leaking fluid, or pieces of tissue come out of your vagina.  You have more pain or cramping.  You keep throwing up (vomiting).  You have pain when you pee (urinate) or have blood in your pee.  You have a fever.  You do not feel your baby moving as much.  You feel very weak or feel like passing out.  You have trouble breathing, with or without belly pain.  You have a very bad headache and belly pain.  You have fluid leaking from your vagina and belly pain.  You keep having watery poop (diarrhea).  Your belly pain does not go away after resting, or the pain gets worse. This information is not intended to replace advice given to you by your health care provider. Make sure you discuss any questions you have with your health care provider. Document Released: 12/25/2008 Document Revised: 08/15/2015 Document Reviewed: 08/05/2012 Elsevier Interactive Patient Education  2017 Elsevier Inc[employer]  Nausea and Vomiting, Adult Introduction Feeling sick to your stomach (nausea) means that your stomach is upset or you feel like you have to throw up (vomit). Feeling more and more sick to your stomach can lead to throwing up. Throwing up happens when food and liquid from your stomach are  thrown up and out the mouth. Throwing up can make you feel weak and cause you to get dehydrated. Dehydration can make you tired and thirsty, make you have a dry mouth, and make it so you pee (urinate) less often. Older adults and people with other diseases or a weak defense system (immune system) are at higher risk for dehydration. If you feel sick to your stomach or if you throw up, it is important to follow instructions from your doctor about how to take care of yourself. Follow these instructions at home: Eating and drinking Follow these instructions as told by your doctor:  Take an oral rehydration solution (ORS). This is a drink that is sold at pharmacies and stores.  Drink clear fluids in small amounts as you are able, such as:  Water.  Ice chips.  Diluted fruit juice.  Low-calorie sports drinks.  Eat bland, easy-to-digest foods in small amounts as you are able, such as:  Bananas.  Applesauce.  Rice.  Low-fat (lean) meats.  Toast.  Crackers.  Avoid fluids that have a lot of sugar or caffeine in them.  Avoid alcohol.  Avoid spicy or fatty foods. General instructions  Drink enough fluid to keep your pee (urine) clear or pale yellow.  Wash your hands often. If you cannot use soap and water, use hand sanitizer.  Make sure that all people in your home wash their hands well and often.  Take over-the-counter and prescription medicines only as told  by your doctor.  Rest at home while you get better.  Watch your condition for any changes.  Breathe slowly and deeply when you feel sick to your stomach.  Keep all follow-up visits as told by your doctor. This is important. Contact a doctor if:  You have a fever.  You cannot keep fluids down.  Your symptoms get worse.  You have new symptoms.  You feel sick to your stomach for more than two days.  You feel light-headed or dizzy.  You have a headache.  You have muscle cramps. Get help right away if:  You  have pain in your chest, neck, arm, or jaw.  You feel very weak or you pass out (faint).  You throw up again and again.  You see blood in your throw-up.  Your throw-up looks like black coffee grounds.  You have bloody or black poop (stools) or poop that look like tar.  You have a very bad headache, a stiff neck, or both.  You have a rash.  You have very bad pain, cramping, or bloating in your belly (abdomen).  You have trouble breathing.  You are breathing very quickly.  Your heart is beating very quickly.  Your skin feels cold and clammy.  You feel confused.  You have pain when you pee.  You have signs of dehydration, such as:  Dark pee, hardly any pee, or no pee.  Cracked lips.  Dry mouth.  Sunken eyes.  Sleepiness.  Weakness. These symptoms may be an emergency. Do not wait to see if the symptoms will go away. Get medical help right away. Call your local emergency services (911 in the [place] Do not drive yourself to the hospital.  This information is not intended to replace advice given to you by your health care provider. Make sure you discuss any questions you have with your health care provider. Document Released: 06/25/2007 Document Revised: 07/27/2015 Document Reviewed: 09/12/2014  2017 Elsevier

## 2016-01-21 NOTE — L&D Delivery Note (Signed)
Obstetrical Delivery Note   Date of Delivery:   06/23/2016 Primary OB:   [facility] Gestational Age/EDD: 5217w2d Antepartum complications: Monochorionic-Diamnionitic twins, preterm labor, BMI 47, Rh negative  Delivered By:   Ernestina PennaNicholas Schenk, MD  Delivery Type:   spontaneous vaginal delivery  Delivery Details:   Patient moved to OR room after checking and patient complete with urge to push. Patient easily delivered fetus A in DOA position and cord immediately clamped and cut. SVE felt completion dilation and fetus B cephalic on u/s and AROM for clear fluid. SVE now 8cm and felt placenta immediately adjacent to cervix on the right side and patient stayed at 8cm and fetus -2 to -3 and category II with variable decels with contractions. Given placentation, decision made to proceed with urgent c-section (see op note). Anesthesia:    epidural Intrapartum complications: preterm labor (normal course) GBS:    Negative. she received at least two doses of antibiotics prior to delivery before PCR was back Laceration:    none Episiotomy:    none Rectal exam:   deferred Placenta:    See OR note Delayed Cord Clamping: no Estimated Blood Loss:  Minimal for vaginal delivery  Baby:    Liveborn female, APGARs 5/9, weight 2570gm  Jacqueline Clarke, Jr. MD [facility]ending [facility](Faculty Practice)

## 2016-01-23 ENCOUNTER — Ambulatory Visit (INDEPENDENT_AMBULATORY_CARE_PROVIDER_SITE_OTHER): Payer: Medicaid Other | Admitting: Certified Nurse Midwife

## 2016-01-23 VITALS — BP 111/76 | HR 73 | Temp 98.6°F | Wt 276.7 lb

## 2016-01-23 DIAGNOSIS — F3340 Major depressive disorder, recurrent, in remission, unspecified: Secondary | ICD-10-CM

## 2016-01-23 DIAGNOSIS — O26892 Other specified pregnancy related conditions, second trimester: Secondary | ICD-10-CM

## 2016-01-23 DIAGNOSIS — O0992 Supervision of high risk pregnancy, unspecified, second trimester: Secondary | ICD-10-CM

## 2016-01-23 DIAGNOSIS — R51 Headache: Secondary | ICD-10-CM

## 2016-01-23 DIAGNOSIS — Z3A13 13 weeks gestation of pregnancy: Secondary | ICD-10-CM

## 2016-01-23 DIAGNOSIS — O219 Vomiting of pregnancy, unspecified: Secondary | ICD-10-CM

## 2016-01-23 DIAGNOSIS — O30032 Twin pregnancy, monochorionic/diamniotic, second trimester: Secondary | ICD-10-CM

## 2016-01-23 DIAGNOSIS — O360120 Maternal care for anti-D [Rh] antibodies, second trimester, not applicable or unspecified: Secondary | ICD-10-CM

## 2016-01-23 DIAGNOSIS — E559 Vitamin D deficiency, unspecified: Secondary | ICD-10-CM

## 2016-01-23 DIAGNOSIS — R7989 Other specified abnormal findings of blood chemistry: Secondary | ICD-10-CM

## 2016-01-23 MED ORDER — BUTALBITAL-APAP-CAFFEINE 50-325-40 MG PO TABS
1.0000 | ORAL_TABLET | Freq: Four times a day (QID) | ORAL | 4 refills | Status: DC | PRN
Start: 2016-01-23 — End: 2016-04-20

## 2016-01-23 MED ORDER — PRENATE PIXIE 10-0.6-0.4-200 MG PO CAPS: 1.0000 | ORAL_CAPSULE | Freq: Every day | ORAL | 12 refills | Status: DC

## 2016-01-23 MED ORDER — ONDANSETRON HCL 8 MG PO TABS: 8.0000 mg | ORAL_TABLET | Freq: Three times a day (TID) | ORAL | 2 refills | Status: DC | PRN

## 2016-01-23 NOTE — Progress Notes (Signed)
Patient c/o headaches and unable to keep anything down for the past couple days

## 2016-01-23 NOTE — Progress Notes (Signed)
  Subjective:    Jacqueline Clarke is a 31 y.o. female being seen today for her obstetrical visit. She is at 5335w4d gestation. Patient reports: nausea, no bleeding, no contractions, no cramping, no leaking and vomiting.  Problem List Items Addressed This Visit      Other   Pregnancy   Depression   Monochorionic diamniotic twin gestation   Relevant Orders   AMB referral to maternal fetal medicine   US MFM OB DETAIL +14 WK   US MFM OB DETAIL ADDL GEST +14 WK   Supervision of high-risk pregnancy   Relevant Medications   Prenat-FeAsp-Meth-FA-DHA w/o A (PRENATE PIXIE) 10-0.6-0.4-200 MG CAPS   Other Relevant Orders   AMB referral to maternal fetal medicine   US MFM OB DETAIL +14 WK   US MFM OB DETAIL ADDL GEST +14 WK   AMB MFM GENETICS REFERRAL   Low vitamin D level   Relevant Orders   AMB referral to maternal fetal medicine   US MFM OB DETAIL +14 WK   US MFM OB DETAIL ADDL GEST +14 WK   Anti-D antibodies present during pregnancy   Relevant Orders   AMB referral to maternal fetal medicine   US MFM OB DETAIL +14 WK   US MFM OB DETAIL ADDL GEST +14 WK    Other Visit Diagnoses    Nausea and vomiting during pregnancy prior to [redacted] weeks gestation    -  Primary   Relevant Medications   ondansetron (ZOFRAN) 8 MG tablet   Other Relevant Orders   AMB referral to maternal fetal medicine   US MFM OB DETAIL +14 WK   US MFM OB DETAIL ADDL GEST +14 WK   Headache in pregnancy, antepartum, second trimester       Relevant Medications   butalbital-acetaminophen-caffeine (FIORICET, ESGIC) 50-325-40 MG tablet     Patient Active Problem List   Diagnosis Date Noted  . Low vitamin D level 01/03/2016  . Anti-D antibodies present during pregnancy 01/03/2016  . Supervision of high-risk pregnancy 12/26/2015  . Monochorionic diamniotic twin gestation 12/19/2015  . Depression 07/09/2015  . Pregnancy 06/21/2015    Objective:     BP 111/76   Pulse 73   Temp 98.6 F (37 C)   Wt 276 lb 11.2  oz (125.5 kg)   LMP 10/20/2015 (Approximate)   BMI 39.70 kg/m  Uterine Size: Below umbilicus     Assessment:    Pregnancy @ 7035w4d  weeks Doing well    Plan:  Referral to MFM for genetics screening.   Problem list reviewed and updated. Labs reviewed.  Follow up in 4 weeks. FIRST/CF mutation testing/NIPT/QUAD SCREEN/fragile X/Ashkenazi Jewish population testing/Spinal muscular atrophy discussed: sent to MFM for genetics screening. Role of ultrasound in pregnancy discussed; fetal survey: ordered. Amniocentesis discussed: not indicated. 50% of 25 minute visit spent on counseling and coordination of care.

## 2016-01-26 ENCOUNTER — Encounter (HOSPITAL_COMMUNITY): Payer: Self-pay

## 2016-01-26 ENCOUNTER — Inpatient Hospital Stay (HOSPITAL_COMMUNITY)
Admission: AD | Admit: 2016-01-26 | Discharge: 2016-01-27 | Disposition: A | Discharge status: Home / Self Care | Payer: Medicaid Other | Source: Ambulatory Visit | Attending: Obstetrics and Gynecology | Admitting: Obstetrics and Gynecology

## 2016-01-26 DIAGNOSIS — O219 Vomiting of pregnancy, unspecified: Secondary | ICD-10-CM | POA: Diagnosis not present

## 2016-01-26 DIAGNOSIS — R519 Headache, unspecified: Secondary | ICD-10-CM

## 2016-01-26 DIAGNOSIS — Z3A14 14 weeks gestation of pregnancy: Secondary | ICD-10-CM | POA: Diagnosis not present

## 2016-01-26 DIAGNOSIS — R51 Headache: Secondary | ICD-10-CM | POA: Diagnosis not present

## 2016-01-26 DIAGNOSIS — O26892 Other specified pregnancy related conditions, second trimester: Secondary | ICD-10-CM | POA: Insufficient documentation

## 2016-01-26 LAB — URINALYSIS, ROUTINE W REFLEX MICROSCOPIC
Bilirubin Urine: NEGATIVE
GLUCOSE, UA: NEGATIVE mg/dL
Ketones, ur: NEGATIVE mg/dL
NITRITE: NEGATIVE
PH: 5 (ref 5.0–8.0)
Protein, ur: NEGATIVE mg/dL
SPECIFIC GRAVITY, URINE: 1.029 (ref 1.005–1.030)

## 2016-01-26 MED ORDER — LACTATED RINGERS IV BOLUS (SEPSIS)
1000.0000 mL | Freq: Once | INTRAVENOUS | Status: AC
  Administered 2016-01-26: 1000 mL via INTRAVENOUS

## 2016-01-26 MED ORDER — METOCLOPRAMIDE HCL 5 MG/ML IJ SOLN
10.0000 mg | Freq: Once | INTRAMUSCULAR | Status: AC
  Administered 2016-01-26: 10 mg via INTRAVENOUS
  Filled 2016-01-26: qty 2

## 2016-01-26 MED ORDER — DIPHENHYDRAMINE HCL 50 MG/ML IJ SOLN
12.5000 mg | Freq: Once | INTRAMUSCULAR | Status: AC
  Administered 2016-01-26: 12.5 mg via INTRAVENOUS
  Filled 2016-01-26: qty 1

## 2016-01-26 MED ORDER — DEXAMETHASONE SODIUM PHOSPHATE 10 MG/ML IJ SOLN
10.0000 mg | Freq: Once | INTRAMUSCULAR | Status: AC
  Administered 2016-01-27: 10 mg via INTRAVENOUS
  Filled 2016-01-26: qty 1

## 2016-01-26 NOTE — MAU Provider Note (Signed)
History     CSN: [id number]  Arrival date and time: 01/26/16 2200   None     Chief Complaint  Patient presents with  . Abdominal Pain  . Headache  . Emesis During Pregnancy   HPI   [name] is a 31 y.o. female [phone] @ 7363w0d mono/di twins here in MAU with Headache and N/V. She has vomited more than 9 times today. She is taking diclegis at max dose. She also took Zofran this evening around 5 pm. Nothing has helped the nausea/vomiting. Her HA is tension like. She tried taking Fioricet without relief. She has had a HA similar to this in the past due to dehydration.   OB History    Gravida Para Term Preterm AB Living   7 4 4  0 2 4   SAB TAB Ectopic Multiple Live Births   2 0 0 0 4      Past Medical History:  Diagnosis Date  . Anemia   . Anxiety   . Asthma   . Blood type, Rh negative   . Cholelithiasis   . Chronic back pain    scoliosis- on percocet  . Depression   . Headache(784.0)   . Hyperemesis complicating pregnancy, antepartum 2013  . 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 Obesity   . Pregnancy induced hypertension   . Scoliosis     Past Surgical History:  Procedure Laterality Date  . ADENOIDECTOMY    . ADENOIDECTOMY    . arm surgery    . CHOLECYSTECTOMY    . DILATION AND CURETTAGE OF UTERUS  2008  . NERVE, TENDON AND ARTERY REPAIR Right 06/18/2014   Procedure: NERVE, TENDON AND ARTERY REPAIR;  Surgeon: Bradly BienenstockFred Ortmann, MD;  Location: [facility] OR;  Service: Orthopedics;  Laterality: Right;    Family History  Problem Relation Age of Onset  . Heart disease Maternal Grandfather   . Diabetes Maternal Grandfather   . Heart disease Father   . Anesthesia problems Neg Hx     Social History  Substance Use Topics  . Smoking status: Former Smoker    Packs/day: 0.50    Types: Cigarettes  . Smokeless tobacco: Never Used  . Alcohol use No     Comment: Occassionally    Allergies: No Known Allergies  Prescriptions Prior to Admission   Medication Sig Dispense Refill Last Dose  . butalbital-acetaminophen-caffeine (FIORICET, ESGIC) 50-325-40 MG tablet Take 1-2 tablets by mouth every 6 (six) hours as needed for headache. 45 tablet 4   . Doxylamine-Pyridoxine (DICLEGIS) 10-10 MG TBEC Take 1 tablet with breakfast and lunch.  Take 2 tablets at bedtime. 100 tablet 4   . metroNIDAZOLE (METROGEL VAGINAL) 0.75 % vaginal gel Place 1 Applicatorful vaginally 2 (two) times daily. 70 g 0   . ondansetron (ZOFRAN) 8 MG tablet Take 1 tablet (8 mg total) by mouth every 8 (eight) hours as needed for nausea or vomiting. 40 tablet 2   . Prenat-FeAsp-Meth-FA-DHA w/o A (PRENATE PIXIE) 10-0.6-0.4-200 MG CAPS Take 1 tablet by mouth daily. 30 capsule 12   . promethazine (PHENERGAN) 25 MG suppository Place 1 suppository (25 mg total) rectally every 6 (six) hours as needed for nausea or vomiting. 12 each 0   . Vitamin D, Ergocalciferol, (DRISDOL) 50000 units CAPS capsule Take 1 capsule (50,000 Units total) by mouth every 7 (seven) days. 30 capsule 2    Results for orders placed or performed during the hospital encounter of 01/26/16 (from the  past 48 hour(s))  Urinalysis, Routine w reflex microscopic     Status: Abnormal   Collection Time: 01/26/16 10:15 PM  Result Value Ref Range   Color, Urine YELLOW YELLOW   APPearance HAZY (A) CLEAR   Specific Gravity, Urine 1.029 1.005 - 1.030   pH 5.0 5.0 - 8.0   Glucose, UA NEGATIVE NEGATIVE mg/dL   Hgb urine dipstick MODERATE (A) NEGATIVE   Bilirubin Urine NEGATIVE NEGATIVE   Ketones, ur NEGATIVE NEGATIVE mg/dL   Protein, ur NEGATIVE NEGATIVE mg/dL   Nitrite NEGATIVE NEGATIVE   Leukocytes, UA SMALL (A) NEGATIVE   RBC / HPF TOO NUMEROUS TO COUNT 0 - 5 RBC/hpf   WBC, UA 0-5 0 - 5 WBC/hpf   Bacteria, UA RARE (A) NONE SEEN   Squamous Epithelial / LPF 6-30 (A) NONE SEEN   Mucous PRESENT     Review of Systems  Constitutional: Negative for fever.  Gastrointestinal: Positive for nausea and vomiting. Negative  for diarrhea.  Genitourinary: Negative for dysuria.   Physical Exam   Blood pressure 112/76, pulse 82, temperature 98.3 F (36.8 C), resp. rate 18, height 5\' 11"  (1.803 m), weight 277 lb 9.6 oz (125.9 kg), last menstrual period 10/20/2015, currently breastfeeding.  Physical Exam  Constitutional: She is oriented to person, place, and time. She appears well-developed and well-nourished. No distress.  HENT:  Head: Normocephalic.  Eyes: Pupils are equal, round, and reactive to light.  GI: Soft. She exhibits no distension. There is no tenderness. There is no rebound and no guarding.  Musculoskeletal: Normal range of motion.  Neurological: She is alert and oriented to person, place, and time.  Skin: Skin is warm. She is not diaphoretic.  Psychiatric: Her behavior is normal.    MAU Course  Procedures  None  MDM  + fetal heart tones via doppler  LR bolus X 1 HA cocktail: Reglan, Decadron, Benadryl Patient rates her pain 0/10 following fluids and HA cocktail PO challenge: Pass   Assessment and Plan   A:  1. Headache in pregnancy, antepartum, second trimester   2. Nausea and vomiting in pregnancy prior to [redacted] weeks gestation     P:  Discharge home in stable condition Return to MAU if symptoms worsen Small, frequent meals Continue medication as directed on the Rx   Duane Lope, NP 01/28/2016 8:14 AM

## 2016-01-26 NOTE — MAU Note (Addendum)
Cramping in lower abd since yesterday. Vomiting this am and was just blood and my nose was bleeding. Vomiting all day but not as bloody as initially this am. Diglecis not helping. Bad headache. No vag bleeding. Thick yellow d/c. No diarrhea. Hx migraines when pregnant. Took Fioricet but did not help

## 2016-01-27 DIAGNOSIS — O26892 Other specified pregnancy related conditions, second trimester: Secondary | ICD-10-CM

## 2016-01-27 DIAGNOSIS — R51 Headache: Secondary | ICD-10-CM | POA: Diagnosis not present

## 2016-01-27 NOTE — Discharge Instructions (Signed)
General Headache Without Cause A headache is pain or discomfort felt around the head or neck area. The specific cause of a headache may not be found. There are many causes and types of headaches. A few common ones are:  Tension headaches.  Migraine headaches.  Cluster headaches.  Chronic daily headaches. Follow these instructions at home: Watch your condition for any changes. Take these steps to help with your condition: Managing pain  Take over-the-counter and prescription medicines only as told by your health care provider.  Lie down in a dark, quiet room when you have a headache.  If directed, apply ice to the head and neck area:  Put ice in a plastic bag.  Place a towel between your skin and the bag.  Leave the ice on for 20 minutes, 2-3 times per day.  Use a heating pad or hot shower to apply heat to the head and neck area as told by your health care provider.  Keep lights dim if bright lights bother you or make your headaches worse. Eating and drinking  Eat meals on a regular schedule.  Limit alcohol use.  Decrease the amount of caffeine you drink, or stop drinking caffeine. General instructions  Keep all follow-up visits as told by your health care provider. This is important.  Keep a headache journal to help find out what may trigger your headaches. For example, write down:  What you eat and drink.  How much sleep you get.  Any change to your diet or medicines.  Try massage or other relaxation techniques.  Limit stress.  Sit up straight, and do not tense your muscles.  Do not use tobacco products, including cigarettes, chewing tobacco, or e-cigarettes. If you need help quitting, ask your health care provider.  Exercise regularly as told by your health care provider.  Sleep on a regular schedule. Get 7-9 hours of sleep, or the amount recommended by your health care provider. Contact a health care provider if:  Your symptoms are not helped by  medicine.  You have a headache that is different from the usual headache.  You have nausea or you vomit.  You have a fever. Get help right away if:  Your headache becomes severe.  You have repeated vomiting.  You have a stiff neck.  You have a loss of vision.  You have problems with speech.  You have pain in the eye or ear.  You have muscular weakness or loss of muscle control.  You lose your balance or have trouble walking.  You feel faint or pass out.  You have confusion. This information is not intended to replace advice given to you by your health care provider. Make sure you discuss any questions you have with your health care provider. Document Released: 01/06/2005 Document Revised: 06/14/2015 Document Reviewed: 05/01/2014 Elsevier Interactive Patient Education  2017 Else[employer] Inc.  Blanding Diet Introduction A bland diet consists of foods that do not have a lot of fat or fiber. Foods without fat or fiber are easier for the body to digest. They are also less likely to irritate your mouth, throat, stomach, and other parts of your gastrointestinal tract. A bland diet is sometimes called a BRAT diet. What is my plan? Your health care provider or dietitian may recommend specific changes to your diet to prevent and treat your symptoms, such as:  Eating small meals often.  Cooking food until it is soft enough to chew easily.  Chewing your food well.  Drinking fluids slowly.  Not eating foods that are very spicy, sour, or fatty.  Not eating citrus fruits, such as oranges and grapefruit. What do I need to know about this diet?  Eat a variety of foods from the bland diet food list.  Do not follow a bland diet longer than you have to.  Ask your health care provider whether you should take vitamins. What foods can I eat? Grains  Hot cereals, such as cream of wheat. Bread, crackers, or tortillas made from refined white flour. Rice. Vegetables  Canned or cooked  vegetables. Mashed or boiled potatoes. Fruits  Bananas. Applesauce. Other types of cooked or canned fruit with the skin and seeds removed, such as canned peaches or pears. Meats and Other Protein Sources  Scrambled eggs. Creamy peanut butter or other nut butters. Lean, well-cooked meats, such as chicken or fish. Tofu. Soups or broths. Dairy  Low-fat dairy products, such as milk, cottage cheese, or yogurt. Beverages  Water. Herbal tea. Apple juice. Sweets and Desserts  Pudding. Custard. Fruit gelatin. Ice cream. Fats and Oils  Mild salad dressings. Canola or olive oil. The items listed above may not be a complete list of allowed foods or beverages. Contact your dietitian for more options.  What foods are not recommended? Foods and ingredients that are often not recommended include:  Spicy foods, such as hot sauce or salsa.  Fried foods.  Sour foods, such as pickled or fermented foods.  Raw vegetables or fruits, especially citrus or berries.  Caffeinated drinks.  Alcohol.  Strongly flavored seasonings or condiments. The items listed above may not be a complete list of foods and beverages that are not allowed. Contact your dietitian for more information.  This information is not intended to replace advice given to you by your health care provider. Make sure you discuss any questions you have with your health care provider. Document Released: 04/30/2015 Document Revised: 06/14/2015 Document Reviewed: 01/18/2014  2017 Elsevier

## 2016-01-28 LAB — CULTURE, OB URINE: Special Requests: NORMAL

## 2016-02-20 ENCOUNTER — Encounter (HOSPITAL_COMMUNITY): Payer: Self-pay | Admitting: Certified Nurse Midwife

## 2016-02-26 ENCOUNTER — Other Ambulatory Visit: Payer: Self-pay | Admitting: Certified Nurse Midwife

## 2016-02-26 ENCOUNTER — Ambulatory Visit (HOSPITAL_COMMUNITY): Payer: Medicaid Other

## 2016-02-26 ENCOUNTER — Encounter (HOSPITAL_COMMUNITY): Payer: Self-pay

## 2016-02-26 ENCOUNTER — Ambulatory Visit (HOSPITAL_COMMUNITY)
Admission: RE | Admit: 2016-02-26 | Discharge: 2016-02-26 | Disposition: A | Discharge status: Home / Self Care | Payer: Medicaid Other | Source: Ambulatory Visit | Attending: Certified Nurse Midwife | Admitting: Certified Nurse Midwife

## 2016-02-26 ENCOUNTER — Ambulatory Visit (INDEPENDENT_AMBULATORY_CARE_PROVIDER_SITE_OTHER): Payer: Medicaid Other | Admitting: Certified Nurse Midwife

## 2016-02-26 ENCOUNTER — Other Ambulatory Visit: Payer: Self-pay

## 2016-02-26 ENCOUNTER — Other Ambulatory Visit (HOSPITAL_COMMUNITY): Payer: Self-pay | Admitting: *Deleted

## 2016-02-26 DIAGNOSIS — Z315 Encounter for genetic counseling: Secondary | ICD-10-CM | POA: Insufficient documentation

## 2016-02-26 DIAGNOSIS — O30032 Twin pregnancy, monochorionic/diamniotic, second trimester: Secondary | ICD-10-CM

## 2016-02-26 DIAGNOSIS — O0992 Supervision of high risk pregnancy, unspecified, second trimester: Secondary | ICD-10-CM

## 2016-02-26 DIAGNOSIS — Z3A18 18 weeks gestation of pregnancy: Secondary | ICD-10-CM | POA: Diagnosis not present

## 2016-02-26 DIAGNOSIS — O360120 Maternal care for anti-D [Rh] antibodies, second trimester, not applicable or unspecified: Secondary | ICD-10-CM

## 2016-02-26 DIAGNOSIS — R7989 Other specified abnormal findings of blood chemistry: Secondary | ICD-10-CM

## 2016-02-26 DIAGNOSIS — O09892 Supervision of other high risk pregnancies, second trimester: Secondary | ICD-10-CM | POA: Diagnosis not present

## 2016-02-26 DIAGNOSIS — O219 Vomiting of pregnancy, unspecified: Secondary | ICD-10-CM

## 2016-02-26 DIAGNOSIS — O30039 Twin pregnancy, monochorionic/diamniotic, unspecified trimester: Secondary | ICD-10-CM

## 2016-02-26 NOTE — Progress Notes (Signed)
Pt states increase in pelvic and back pain/pressure. Pt states some ctx. Pt had genetic counsel appt today

## 2016-02-26 NOTE — Progress Notes (Signed)
Maternal Fetal Medicine Consultation  Requesting Provider(s): Marjo Bickerenney, CNM  Primary Ob: Denney CNM Reason for consultation: Monochorionic diamniotic twin gestation  HPI: 31yo [id number] at 18+2 weeks with a monochorionic diamniotic twin gestation. Referred for twin consultation. US performed today confirmed mono/di status and showed no evidence of twin-to-twin transfusion. Her previous pregancies were all vaginal deliveries, with weights ranging from 6#13oz to 9#7oz. The last three had late PIH. She has an antibody screen weakly positive fir anti-D, but this is most likely due to RhoGam injection in early gestation. She is asymptomatic   OB History: OB History    Gravida Para Term Preterm AB Living   7 4 4  0 2 4   SAB TAB Ectopic Multiple Live Births   2 0 0 0 4      PMH:  Past Medical History:  Diagnosis Date  . Anemia   . Anxiety   . Asthma   . Blood type, Rh negative   . Cholelithiasis   . Chronic back pain    scoliosis- on percocet  . Depression   . Headache(784.0)   . Hyperemesis complicating pregnancy, antepartum 2013  . 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 Obesity   . Pregnancy induced hypertension   . Scoliosis     PSH:  Past Surgical History:  Procedure Laterality Date  . ADENOIDECTOMY    . ADENOIDECTOMY    . arm surgery    . CHOLECYSTECTOMY    . DILATION AND CURETTAGE OF UTERUS  2008  . NERVE, TENDON AND ARTERY REPAIR Right 06/18/2014   Procedure: NERVE, TENDON AND ARTERY REPAIR;  Surgeon: Bradly BienenstockFred Ortmann, MD;  Location: [facility] OR;  Service: Orthopedics;  Laterality: Right;   Meds: PNV Allergies: NKDA Fh: See EPIC section  Soc: See EPIC section  Review of Systems: no vaginal bleeding or cramping/contractions, no LOF, no nausea/vomiting. All other systems reviewed and are negative.  PE: See EPIC section for vital sand physical exam  Please see separate document for fetal ultrasound report.  A/P: Monochorionic/diamniotic twin gestation I had a long  discussion with the patient about her twins. She will be seen every 2 weeks to assess for TTTS, and we will check growth every 4 weeks as long as growth remains normal range. I have asked her to begin daily 81mg  aspirin for preeclampsia prevention. As we have no effective interventions for PTD prevention in twins, special monitoring for risk of PTD such as cervical lengths are not indicated. Delivery should be planned between 36 and 37 weeks, depending on clinical course. She was scheduled to see genetic counseling, but after review by both myself and the genetic counselor, her history does not reveal any increased risks for genetic disease so this was not performed  Thank you for the opportunity to be a part of the care of Argusta L Imai. Please contact our office if we can be of further assistance.   I spent approximately 30 minutes with this patient with over 50% of time spent in face-to-face counseling.

## 2016-02-26 NOTE — Progress Notes (Signed)
   PRENATAL VISIT NOTE  Subjective:  Jacqueline Clarke is a 31 y.o. [id number] at 3627w3d being seen today for ongoing prenatal care.  She is currently monitored for the following issues for this high-risk pregnancy and has Pregnancy; Depression; Monochorionic diamniotic twin gestation; Supervision of high-risk pregnancy; Low vitamin D level; Anti-D antibodies present during pregnancy; [redacted] weeks gestation of pregnancy; and Encounter for procreative genetic counseling on her problem list.  Patient reports backache, no bleeding, no contractions, no leaking and occasional cramping, unable to sleep at night, dicussed OTC benadryl/tylenol.  Contractions: Irritability. Vag. Bleeding: None.  Movement: Present. Denies leaking of fluid.   The following portions of the patient's history were reviewed and updated as appropriate: allergies, current medications, past family history, past medical history, past social history, past surgical history and problem list. Problem list updated.  Objective:   Vitals:   02/26/16 1310  BP: 131/76  Pulse: 82  Weight: 291 lb (132 kg)    Fetal Status: Fetal Heart Rate (bpm): A: 138 B: 148 Fundal Height: 20 cm Movement: Present     General:  Alert, oriented and cooperative. Patient is in no acute distress.  Skin: Skin is warm and dry. No rash noted.   Cardiovascular: Normal heart rate noted  Respiratory: Normal respiratory effort, no problems with respiration noted  Abdomen: Soft, gravid, appropriate for gestational age. Pain/Pressure: Present     Pelvic:  Cervical exam deferred        Extremities: Normal range of motion.     Mental Status: Normal mood and affect. Normal behavior. Normal judgment and thought content.   Assessment and Plan:  Pregnancy: [id number] at 7127w3d  1. Supervision of high risk pregnancy in second trimester     Had US this AM at [facility].  Mono/Di twin pregnancy.   Preterm labor symptoms and general obstetric precautions including but not limited  to vaginal bleeding, contractions, leaking of fluid and fetal movement were reviewed in detail with the patient. Please refer to After Visit Summary for other counseling recommendations.  [facility]2016[facility]   Roe Coombsachelle A Ival Pacer, CNM

## 2016-02-26 NOTE — Progress Notes (Signed)
Genetic Counseling  Visit Summary Note  Appointment Date: 02/26/2016 Referred By: Roe Coombs  Date of Birth: 05-10-1985  Pregnancy history: [id number] Estimated Date of Delivery: 07/26/16 Estimated Gestational Age: [redacted]w[redacted]d   Jacqueline Clarke was seen for genetic counseling to discuss available screening options.   In summary:  Discussed age related risk for fetal aneuploidy - low risk  Discussed options for screening  Quad screen - drawn today  NIPS - declined   Ultrasound - performed today  Reviewed family history concerns - none reported  Discussed carrier screening options  CF - no mutation previously identified  SMA - drawn today  Hemoglobinopathies - no abnormal Hg detected previously   We began by reviewing Jacqueline Clarke's medical history. She is a 31 year old [id number] with a history of migraines and nausea and vomiting in pregnancy. She has no other health concerns and denied exposure to environmental toxins or chemical agents. She denied the use of alcohol, tobacco or street drugs. She denied significant viral illnesses during the course of her pregnancy.   Both family histories were reviewed and found to be noncontributory for birth defects, intellectual disability, and known genetic conditions. Without further information regarding the provided family history, an accurate genetic risk cannot be calculated. Further genetic counseling is warranted if more information is obtained.  We then discussed routine screening options. She was counseled regarding ACOG recommended screening for cystic fibrosis, spinal muscular atrophy and hemoglobinopathies.  We reviewed the autosomal recessive patterns of inheritance of these conditions, the carrier frequencies, and the availability of prenatal diagnosis if indicated. In addition, she was made aware that she was previously screened for hemoglobin disorders and had a normal hemoglobin electrophoresis.  She also had screening for  cystic fibrosis by mutational analysis and a mutation was not identified.  We discussed the option of screening for spinal muscular atrophy today.  We discussed the cost of the screening, and the next steps if she were to be identified as a carrier.  She elected carrier screening for SMA today.  She will be contacted directly with those results and they will be forwarded to your office as well.  Jacqueline Clarke was counseled regarding the availability of screening for fetal aneuploidy. Considering Jacqueline Clarke's maternal age of 31 y.o., a modi twin gestation and her negative family history, we discussed that she is considered to be in the low risk category for having a fetus with aneuploidy. We reviewed chromosomes, nondisjunction, and the associated 1 in 700 risk for fetal aneuploidy related to a maternal age of 31 y.o. at [redacted]w[redacted]d gestation. She was counseled that the risk for aneuploidy decreases as gestational age increases, accounting for those pregnancies which spontaneously abort. We briefly discussed common fetal chromosome conditions including the features and prognoses of each.   Jacqueline Clarke had an anatomy ultrasound today.  That report will be sent under separate cover.  In brief, we discussed that 50-80% of fetuses with Down syndrome and up to 90% of fetuses with Trisomy 18 or Trisomy 13, will have detectable anomalies or soft markers of aneuploidy on targeted anatomy ultrasound.  We also discussed available routine screening options, specifically the Quad screen. She was counseled that screening tests are used to modify a patient's a priori risk for aneuploidy, typically based on age. This estimate provides a pregnancy specific risk assessment. We reviewed the benefits and limitations of each option. Specifically, we discussed the conditions for which each test screens, the detection rates, and false positive rates of each.  She was counseled that the Quad screen was performed for her in her  previous pregnancies.  However, as her current pregnancy is a twin gestation, a Quad screen is not able to provide a risk for Trisomy 18 in the pregnancy, and the detection rate for Down syndrome is expected to be lower than in a singleton gestation.  She was also counseled regarding noninvasive prenatal screening (NIPS)/cell free DNA (cfDNA). We discussed that NIPS would screen for fetal aneuploidy in the pregnancy, but would not provide risk assessment for an open neural tube defect. We discussed the possible results that the screens might provide including: positive, negative, unanticipated, and no result. Finally, she was counseled regarding the cost of each option and potential out of pocket expenses (none expected with Medicaid). Following our discussion, Jacqueline Clarke elected to have Quad screen drawn today.  Those results will be forwarded directly to your office from the laboratory.  I counseled Jacqueline Clarke regarding the above risks and available options. The approximate face-to-face time with the genetic counselor was 45 minutes.  Jacqueline Gemmaaragh Dishon Kehoe, MS,  Certified Genetic Counselor

## 2016-02-27 LAB — QUAD SCREEN FOR MFM

## 2016-03-05 ENCOUNTER — Other Ambulatory Visit: Payer: Self-pay

## 2016-03-06 ENCOUNTER — Other Ambulatory Visit: Payer: Self-pay | Admitting: Certified Nurse Midwife

## 2016-03-06 DIAGNOSIS — O0992 Supervision of high risk pregnancy, unspecified, second trimester: Secondary | ICD-10-CM

## 2016-03-12 ENCOUNTER — Ambulatory Visit (HOSPITAL_COMMUNITY)
Admission: RE | Admit: 2016-03-12 | Discharge: 2016-03-12 | Disposition: A | Discharge status: Home / Self Care | Payer: Medicaid Other | Source: Ambulatory Visit | Attending: Certified Nurse Midwife | Admitting: Certified Nurse Midwife

## 2016-03-12 ENCOUNTER — Encounter (HOSPITAL_COMMUNITY): Payer: Self-pay

## 2016-03-12 DIAGNOSIS — Z3A20 20 weeks gestation of pregnancy: Secondary | ICD-10-CM | POA: Diagnosis not present

## 2016-03-12 DIAGNOSIS — O30032 Twin pregnancy, monochorionic/diamniotic, second trimester: Secondary | ICD-10-CM | POA: Diagnosis not present

## 2016-03-12 DIAGNOSIS — O99212 Obesity complicating pregnancy, second trimester: Secondary | ICD-10-CM | POA: Insufficient documentation

## 2016-03-12 DIAGNOSIS — O09892 Supervision of other high risk pregnancies, second trimester: Secondary | ICD-10-CM | POA: Insufficient documentation

## 2016-03-14 ENCOUNTER — Encounter: Payer: Self-pay | Admitting: *Deleted

## 2016-03-25 ENCOUNTER — Encounter: Payer: Self-pay | Admitting: Obstetrics and Gynecology

## 2016-03-26 ENCOUNTER — Ambulatory Visit (HOSPITAL_COMMUNITY): Payer: Medicaid Other

## 2016-03-27 ENCOUNTER — Ambulatory Visit (INDEPENDENT_AMBULATORY_CARE_PROVIDER_SITE_OTHER): Payer: Medicaid Other | Admitting: Obstetrics & Gynecology

## 2016-03-27 VITALS — BP 122/82 | HR 102 | Wt 297.4 lb

## 2016-03-27 DIAGNOSIS — O0992 Supervision of high risk pregnancy, unspecified, second trimester: Secondary | ICD-10-CM

## 2016-03-27 DIAGNOSIS — O30032 Twin pregnancy, monochorionic/diamniotic, second trimester: Secondary | ICD-10-CM

## 2016-03-27 DIAGNOSIS — O26892 Other specified pregnancy related conditions, second trimester: Secondary | ICD-10-CM

## 2016-03-27 DIAGNOSIS — M25559 Pain in unspecified hip: Secondary | ICD-10-CM

## 2016-03-27 MED ORDER — CYCLOBENZAPRINE HCL 10 MG PO TABS: 10.0000 mg | ORAL_TABLET | Freq: Three times a day (TID) | ORAL | 3 refills | Status: DC | PRN

## 2016-03-27 MED ORDER — ASPIRIN EC 81 MG PO TBEC: 81.0000 mg | DELAYED_RELEASE_TABLET | Freq: Every day | ORAL | 2 refills | Status: DC

## 2016-03-27 NOTE — Progress Notes (Signed)
   PRENATAL VISIT NOTE  Subjective:  Jacqueline Clarke is a 31 y.o. [id number] at 4561w5d being seen today for ongoing prenatal care.  She is currently monitored for the following issues for this high-risk pregnancy and has Depression; Monochorionic diamniotic twin gestation; Supervision of high-risk pregnancy; Low vitamin D level; Anti-D antibodies present during pregnancy; and Encounter for procreative genetic counseling on her problem list.  Patient reports hip pain due to enlarging pregnancy.  Contractions: Irritability. Vag. Bleeding: None.  Movement: Present. Denies leaking of fluid.   The following portions of the patient's history were reviewed and updated as appropriate: allergies, current medications, past family history, past medical history, past social history, past surgical history and problem list. Problem list updated.  Objective:   Vitals:   03/27/16 1315  BP: 122/82  Pulse: (!) 102  Weight: 297 lb 6.4 oz (134.9 kg)    Fetal Status: Fetal Heart Rate (bpm): A158 /B154   Movement: Present     General:  Alert, oriented and cooperative. Patient is in no acute distress.  Skin: Skin is warm and dry. No rash noted.   Cardiovascular: Normal heart rate noted  Respiratory: Normal respiratory effort, no problems with respiration noted  Abdomen: Soft, gravid, appropriate for gestational age. Pain/Pressure: Absent     Pelvic:  Cervical exam deferred        Extremities: Normal range of motion.  Edema: Trace  Mental Status: Normal mood and affect. Normal behavior. Normal judgment and thought content.   Assessment and Plan:  Pregnancy: [id number] at 6761w5d  1. Monochorionic diamniotic twin gestation in second trimester Continue serial scans as per MFM. - aspirin EC 81 MG tablet; Take 1 tablet (81 mg total) by mouth daily. Take after 12 weeks for prevention of preeclampsia later in pregnancy  Dispense: 300 tablet; Refill: 2  2. Pregnancy related hip pain, antepartum, second  trimester Tylenol also recommended - cyclobenzaprine (FLEXERIL) 10 MG tablet; Take 1 tablet (10 mg total) by mouth 3 (three) times daily as needed for muscle spasms.  Dispense: 30 tablet; Refill: 3  3. Supervision of high risk pregnancy in second trimester Preterm labor symptoms and general obstetric precautions including but not limited to vaginal bleeding, contractions, leaking of fluid and fetal movement were reviewed in detail with the patient. Please refer to After Visit Summary for other counseling recommendations.  Return in about 3 weeks (around 04/17/2016) for OB Visit.   Tereso NewcomerUgonna A Jyaire Koudelka, MD

## 2016-03-27 NOTE — Patient Instructions (Signed)
Return to clinic for any scheduled appointments or obstetric concerns, or go to MAU for evaluation  

## 2016-03-28 ENCOUNTER — Ambulatory Visit (HOSPITAL_COMMUNITY)
Admission: RE | Admit: 2016-03-28 | Discharge: 2016-03-28 | Disposition: A | Discharge status: Home / Self Care | Payer: Medicaid Other | Source: Ambulatory Visit | Attending: Certified Nurse Midwife | Admitting: Certified Nurse Midwife

## 2016-03-28 ENCOUNTER — Encounter (HOSPITAL_COMMUNITY): Payer: Self-pay

## 2016-03-28 DIAGNOSIS — O09892 Supervision of other high risk pregnancies, second trimester: Secondary | ICD-10-CM | POA: Insufficient documentation

## 2016-03-28 DIAGNOSIS — O0992 Supervision of high risk pregnancy, unspecified, second trimester: Secondary | ICD-10-CM

## 2016-03-28 DIAGNOSIS — O30032 Twin pregnancy, monochorionic/diamniotic, second trimester: Secondary | ICD-10-CM | POA: Diagnosis not present

## 2016-03-28 DIAGNOSIS — Z3A22 22 weeks gestation of pregnancy: Secondary | ICD-10-CM | POA: Insufficient documentation

## 2016-03-28 DIAGNOSIS — O99212 Obesity complicating pregnancy, second trimester: Secondary | ICD-10-CM | POA: Insufficient documentation

## 2016-03-31 ENCOUNTER — Other Ambulatory Visit: Payer: Self-pay | Admitting: Certified Nurse Midwife

## 2016-03-31 DIAGNOSIS — O0992 Supervision of high risk pregnancy, unspecified, second trimester: Secondary | ICD-10-CM

## 2016-04-09 ENCOUNTER — Encounter (HOSPITAL_COMMUNITY): Payer: Self-pay

## 2016-04-09 ENCOUNTER — Ambulatory Visit (HOSPITAL_COMMUNITY)
Admission: RE | Admit: 2016-04-09 | Discharge: 2016-04-09 | Disposition: A | Discharge status: Home / Self Care | Payer: Medicaid Other | Source: Ambulatory Visit | Attending: Certified Nurse Midwife | Admitting: Certified Nurse Midwife

## 2016-04-09 DIAGNOSIS — Z3A24 24 weeks gestation of pregnancy: Secondary | ICD-10-CM | POA: Insufficient documentation

## 2016-04-09 DIAGNOSIS — O30032 Twin pregnancy, monochorionic/diamniotic, second trimester: Secondary | ICD-10-CM | POA: Insufficient documentation

## 2016-04-10 ENCOUNTER — Other Ambulatory Visit (HOSPITAL_COMMUNITY): Payer: Self-pay | Admitting: *Deleted

## 2016-04-10 DIAGNOSIS — O30039 Twin pregnancy, monochorionic/diamniotic, unspecified trimester: Secondary | ICD-10-CM

## 2016-04-15 ENCOUNTER — Encounter (HOSPITAL_COMMUNITY): Payer: Self-pay | Admitting: *Deleted

## 2016-04-15 ENCOUNTER — Inpatient Hospital Stay (HOSPITAL_COMMUNITY)
Admission: AD | Admit: 2016-04-15 | Discharge: 2016-04-15 | Disposition: A | Discharge status: Home / Self Care | Payer: Medicaid Other | Source: Ambulatory Visit | Attending: Obstetrics and Gynecology | Admitting: Obstetrics and Gynecology

## 2016-04-15 DIAGNOSIS — O479 False labor, unspecified: Secondary | ICD-10-CM

## 2016-04-15 DIAGNOSIS — R1030 Lower abdominal pain, unspecified: Secondary | ICD-10-CM | POA: Diagnosis present

## 2016-04-15 DIAGNOSIS — O4702 False labor before 37 completed weeks of gestation, second trimester: Secondary | ICD-10-CM | POA: Diagnosis not present

## 2016-04-15 DIAGNOSIS — Z7982 Long term (current) use of aspirin: Secondary | ICD-10-CM | POA: Diagnosis not present

## 2016-04-15 DIAGNOSIS — O30032 Twin pregnancy, monochorionic/diamniotic, second trimester: Secondary | ICD-10-CM | POA: Diagnosis not present

## 2016-04-15 DIAGNOSIS — Z87891 Personal history of nicotine dependence: Secondary | ICD-10-CM | POA: Insufficient documentation

## 2016-04-15 DIAGNOSIS — Z3A25 25 weeks gestation of pregnancy: Secondary | ICD-10-CM | POA: Diagnosis not present

## 2016-04-15 DIAGNOSIS — O0992 Supervision of high risk pregnancy, unspecified, second trimester: Secondary | ICD-10-CM

## 2016-04-15 LAB — URINALYSIS, ROUTINE W REFLEX MICROSCOPIC
BILIRUBIN URINE: NEGATIVE
Glucose, UA: NEGATIVE mg/dL
HGB URINE DIPSTICK: NEGATIVE
Ketones, ur: NEGATIVE mg/dL
Nitrite: NEGATIVE
Protein, ur: 30 mg/dL — AB
SPECIFIC GRAVITY, URINE: 1.028 (ref 1.005–1.030)
pH: 5 (ref 5.0–8.0)

## 2016-04-15 MED ORDER — TRAMADOL HCL 50 MG PO TABS: 50.0000 mg | ORAL_TABLET | Freq: Four times a day (QID) | ORAL | 0 refills | Status: DC | PRN

## 2016-04-15 MED ORDER — ACETAMINOPHEN 500 MG PO TABS: 1000.0000 mg | ORAL_TABLET | Freq: Four times a day (QID) | ORAL | Status: DC | PRN

## 2016-04-15 MED ORDER — ACETAMINOPHEN 500 MG PO TABS
1000.0000 mg | ORAL_TABLET | Freq: Four times a day (QID) | ORAL | Status: AC | PRN
  Administered 2016-04-15: 1000 mg via ORAL
  Filled 2016-04-15: qty 2

## 2016-04-15 NOTE — [facility]U Provider Note (Signed)
Chief Complaint: Abdominal Pain   First Provider Initiated Contact with Patient 04/15/16 0154     SUBJECTIVE HPI: Jacqueline Clarke is a 31 y.o. [id number] at [redacted]w[redacted]d who presents to Maternity Admissions reporting lower abdominal pain radiating to her lower back since 2 days ago with increasing frequency. Pt also endorses nausea with 2 episodes of emesis 2 days ago which has since resolved. Pt tolerating diet with last bowel movement last night. Denies contractions, leakage of fluid or vaginal bleeding. Good fetal movement.   Pregnancy Course:  Patient Active Problem List   Diagnosis Date Noted  . Encounter for procreative genetic counseling   . Low vitamin D level 01/05/2016  . Anti-D antibodies present during pregnancy 2016-01-05  . Supervision of high-risk pregnancy 12/26/2015  . Monochorionic diamniotic twin gestation 12/19/2015  . Depression 07/09/2015     Past Medical History:  Diagnosis Date  . Anemia   . Anxiety   . Asthma   . Blood type, Rh negative   . Cholelithiasis   . Chronic back pain    scoliosis- on percocet  . Depression   . Headache(784.0)   . Hyperemesis complicating pregnancy, antepartum 2013  . 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 Obesity   . Pregnancy induced hypertension   . Scoliosis    OB History  Gravida Para Term Preterm AB Living  7 4 4  0 2 4  SAB TAB Ectopic Multiple Live Births  2 0 0 0 4    # Outcome Date GA Lbr Len/2nd Weight Sex Delivery Anes PTL Lv  7 Current           6 Term 06/22/15 [redacted]w[redacted]d 07:35 / 00:18 9 lb 1.7 oz (4.13 kg) F Vag-Spont EPI  LIV  5 Term 07/22/11 [redacted]w[redacted]d 03:24 / 00:15 8 lb 15.4 oz (4.065 kg) F Vag-Spont EPI  LIV     Birth Comments: WNL  4 Term 2011 [redacted]w[redacted]d 04:00 6 lb 14 oz (3.118 kg) M Vag-Spont EPI  LIV  3 Term 2010 [redacted]w[redacted]d  7 lb 6 oz (3.345 kg) M Vag-Spont EPI  LIV  2 SAB 2009          1 SAB 2006             Past Surgical History:  Procedure Laterality Date  . ADENOIDECTOMY    . ADENOIDECTOMY    . arm  surgery    . CHOLECYSTECTOMY    . DILATION AND CURETTAGE OF UTERUS  2008  . NERVE, TENDON AND ARTERY REPAIR Right 06/18/2014   Procedure: NERVE, TENDON AND ARTERY REPAIR;  Surgeon: Bradly Bienenstock, MD;  Location: [facility] OR;  Service: Orthopedics;  Laterality: Right;   Family History  Problem Relation Age of Onset  . Heart disease Maternal Grandfather   . Diabetes Maternal Grandfather   . Heart disease Father   . Anesthesia problems Neg Hx    Social History  Substance Use Topics  . Smoking status: Former Smoker    Packs/day: 0.50    Types: Cigarettes  . Smokeless tobacco: Never Used  . Alcohol use No     Comment: Occassionally   No Known Allergies No prescriptions prior to admission.    I have reviewed patient's Past Medical Hx, Surgical Hx, Family Hx, Social Hx, medications and allergies.   ROS:  Review of Systems  Constitutional: Negative for chills and fever.  HENT: Negative for rhinorrhea and sore throat.   Eyes: Negative for photophobia and visual disturbance.  Respiratory: Negative for shortness of breath and wheezing.   Cardiovascular: Positive for leg swelling. Negative for chest pain and palpitations.  Gastrointestinal: Positive for abdominal pain, nausea and vomiting. Negative for diarrhea.  Genitourinary: Negative for flank pain, frequency, hematuria, urgency, vaginal bleeding, vaginal discharge and vaginal pain.  Musculoskeletal: Positive for back pain. Negative for neck pain.  Skin: Negative for pallor and rash.  Neurological: Positive for headaches. Negative for light-headedness.    Physical Exam   Patient Vitals for the past 24 hrs:  BP Temp Temp src Pulse Resp  04/15/16 0247 106/68 - - 96 16  04/15/16 0145 108/71 98.3 F (36.8 C) Oral 98 20   Constitutional: Well-developed, well-nourished female in no acute distress.  Cardiovascular: normal rate Respiratory: normal effort GI: Abd soft, non-tender, gravid appropriate for gestational age. Pos BS x 4 MS:  Extremities nontender, no edema, normal ROM Neurologic: Alert and oriented x 4.  GU: Neg CVAT.  Pelvic: NEFG, physiologic discharge, no blood, cervix clean. No CMT  Dilation: Closed Effacement (%): Thick Cervical Position: Posterior Exam by:: Dr. Genevie Ann  FHT:  Baseline 145 bpm (x2), moderate variability, accelerations present, no decelerations Contractions: none   Labs: Results for orders placed or performed during the hospital encounter of 04/15/16 (from the past 24 hour(s))  Urinalysis, Routine w reflex microscopic     Status: Abnormal   Collection Time: 04/15/16  1:25 AM  Result Value Ref Range   Color, Urine YELLOW YELLOW   APPearance CLOUDY (A) CLEAR   Specific Gravity, Urine 1.028 1.005 - 1.030   pH 5.0 5.0 - 8.0   Glucose, UA NEGATIVE NEGATIVE mg/dL   Hgb urine dipstick NEGATIVE NEGATIVE   Bilirubin Urine NEGATIVE NEGATIVE   Ketones, ur NEGATIVE NEGATIVE mg/dL   Protein, ur 30 (A) NEGATIVE mg/dL   Nitrite NEGATIVE NEGATIVE   Leukocytes, UA LARGE (A) NEGATIVE   RBC / HPF 6-30 0 - 5 RBC/hpf   WBC, UA 6-30 0 - 5 WBC/hpf   Bacteria, UA RARE (A) NONE SEEN   Squamous Epithelial / LPF 6-30 (A) NONE SEEN   Mucous PRESENT     Imaging:  [place] Mfm Ob Follow Up  Result Date: 03/28/2016 ----------------------------------------------------------------------  OBSTETRICS REPORT                      (Signed Final 03/28/2016 04:24 pm) ---------------------------------------------------------------------- Patient Info  ID #:       [id number]                         D.O.B.:   Jun 15, 1985 (30 yrs)  Name:       Jacqueline Clarke                    Visit Date:  03/28/2016 01:47 pm              [place] ---------------------------------------------------------------------- Performed By  Performed By:     Tomma Lightning             Ref. Address:     [address]                    [place]  [address] [place] [address]                                                              [place]                                                             [address]  Attending:        Durwin Nora       Location:         [facility]                    MD  Referred By:      Roe Coombs CNM ---------------------------------------------------------------------- Orders   #  Description                                 Code   1  [place] MFM OB FOLLOW UP                         [id number]   2  [place] MFM OB FOLLOW UP ADDL GEST               60454.09  ----------------------------------------------------------------------   #  Ordered By               Order #        Accession #    Episode #   1  MARK NEWMAN              [id number]      [id number]     [id number]   2  MARK NEWMAN              [id number]      [id number]     [id number]  ---------------------------------------------------------------------- Indications   [redacted] weeks gestation of pregnancy                Z3A.22   Twin pregnancy, mono/di, second trimester      O30.032   Short interval between pregancies, 2nd         O09.892   trimester   Obesity complicating pregnancy, second         O99.212   trimester  ---------------------------------------------------------------------- OB History  Blood Type:            Height:  5'11"  Weight (lb):  295      BMI:   41.14  Gravidity:    7         Term:   4        Prem:   0        SAB:   2  TOP:          0       Ectopic:  0        Living: 4 ---------------------------------------------------------------------- Fetal Evaluation (Fetus A)  Num Of Fetuses:  2  Fetal Heart         153  Rate(bpm):  Cardiac Activity:   Observed  Presentation:       Cephalic  Placenta:           Anterior, above cervical os  Amniotic Fluid  AFI FV:      Subjectively within normal limits                              Largest Pocket(cm)                              7.3 ---------------------------------------------------------------------- Biometry (Fetus A)  BPD:      57.9  mm     G. Age:  23w 5d         78   %    CI:           75   %   70 - 86                                                          FL/HC:      19.1   %   19.2 - 20.8  HC:      212.1  mm     G. Age:  23w 2d         53  %    HC/AC:      1.26       1.05 - 1.21  AC:      167.7  mm     G. Age:  21w 5d         14  %    FL/BPD:     69.9   %   71 - 87  FL:       40.5  mm     G. Age:  23w 1d         47  %    FL/AC:      24.2   %   20 - 24  HUM:      38.5  mm     G. Age:  23w 5d         60  %  Est. FW:     511  gm      1 lb 2 oz     46  %     FW Discordancy      0 \ 1 % ---------------------------------------------------------------------- Gestational Age (Fetus A)  LMP:           22w 6d       Date:   10/20/15                 EDD:   07/26/16  U/S Today:     23w 0d                                        EDD:   07/25/16  Best:          22w 6d    Det. By:   LMP  (10/20/15)  EDD:   07/26/16 ---------------------------------------------------------------------- Anatomy (Fetus A)  Cranium:               Appears normal         Aortic Arch:            Previously seen  Cavum:                 Appears normal         Ductal Arch:            Previously seen  Ventricles:            Appears normal         Diaphragm:              Previously seen  Choroid Plexus:        Previously seen        Stomach:                Appears normal, left                                                                        sided  Cerebellum:            Previously seen        Abdomen:                Appears normal  Posterior Fossa:       Previously seen        Abdominal Wall:         Previously seen  Nuchal Fold:           Previously seen        Cord Vessels:           Previously seen  Face:                  Orbits and profile     Kidneys:                Appear normal                         previously seen  Lips:                  Previously seen        Bladder:                Appears normal  Thoracic:              Appears normal         Spine:                  Previously seen  Heart:                  Appears normal         Upper Extremities:      Previously seen                         (4CH, axis, and si  RVOT:                  Previously seen  Lower Extremities:      Previously seen  LVOT:                  Previously seen  Other:  Fetus appears to be a female. Heels and 5th digit previously visualized.          Nasal bone previously visualized. Open hands previously visualized. ---------------------------------------------------------------------- Fetal Evaluation (Fetus B)  Num Of Fetuses:     2  Fetal Heart         135  Rate(bpm):  Cardiac Activity:   Observed  Presentation:       Transverse, head to maternal left  Placenta:           Anterior, above cervical os  Amniotic Fluid  AFI FV:      Subjectively within normal limits                              Largest Pocket(cm)                              5.2 ---------------------------------------------------------------------- Biometry (Fetus B)  BPD:        54  mm     G. Age:  22w 3d         29  %    CI:        69.08   %   70 - 86                                                          FL/HC:      19.5   %   19.2 - 20.8  HC:      207.5  mm     G. Age:  22w 6d         36  %    HC/AC:      1.22       1.05 - 1.21  AC:      169.5  mm     G. Age:  22w 0d         17  %    FL/BPD:     74.8   %   71 - 87  FL:       40.4  mm     G. Age:  23w 0d         46  %    FL/AC:      23.8   %   20 - 24  HUM:      38.7  mm     G. Age:  23w 5d         63  %  Est. FW:     508  gm      1 lb 2 oz     45  %     FW Discordancy         1  % ---------------------------------------------------------------------- Gestational Age (Fetus B)  LMP:           22w 6d       Date:   10/20/15                 EDD:   07/26/16  U/S Today:  22w 4d                                        EDD:   07/28/16  Best:          22w 6d    Det. By:   LMP  (10/20/15)          EDD:   07/26/16 ---------------------------------------------------------------------- Anatomy (Fetus B)  Cranium:                Appears normal         Aortic Arch:            Previously seen  Cavum:                 Appears normal         Ductal Arch:            Previously seen  Ventricles:            Appears normal         Diaphragm:              Previously seen  Choroid Plexus:        Previously seen        Stomach:                Appears normal, left                                                                        sided  Cerebellum:            Previously seen        Abdomen:                Appears normal  Posterior Fossa:       Previously seen        Abdominal Wall:         Previously seen  Nuchal Fold:           Previously seen        Cord Vessels:           Previously seen  Face:                  Orbits and profile     Kidneys:                Appear normal                         previously seen  Lips:                  Previously seen        Bladder:                Appears normal  Thoracic:              Appears normal         Spine:                  Previously seen  Heart:  Previously seen        Upper Extremities:      Previously seen  RVOT:                  Appears normal         Lower Extremities:      Previously seen  LVOT:                  Appears normal  Other:  Fetus appears to be a female. Heels and 5th digit previously visualized.          Open hands previously visualized. Nasal bone previously visualized. ---------------------------------------------------------------------- Cervix Uterus Adnexa  Cervix  Length:           3.49  cm.  Normal appearance by transabdominal scan.  Uterus  No abnormality visualized.  Left Ovary  Not visualized.  Right Ovary  Not visualized. ---------------------------------------------------------------------- Impression   Monochorionic diamniotic twin intrauterine pregnancy at  [redacted]w[redacted]d  active twin fetuses x 2  concordant growth  concordant fluid  no evidence of TTTS  no previa  cervix is long and closed ----------------------------------------------------------------------  Recommendations  TTTS screen (limited ultrasound) in 2 weeks  interval growth monthly  begin antenatal testing at 32 weeks.  delivery at 36-37 weeks (mo-di twins). ----------------------------------------------------------------------               Durwin Nora, MD Electronically Signed Final Report   03/28/2016 04:24 pm ----------------------------------------------------------------------  [place] Mfm Ob Follow Up Addl Gest  Result Date: 03/28/2016 ----------------------------------------------------------------------  OBSTETRICS REPORT                      (Signed Final 03/28/2016 04:24 pm) ---------------------------------------------------------------------- Patient Info  ID #:       [id number]                         D.O.B.:   [birth year] (30 yrs)  Name:       Marvis Clarke                    Visit Date:  03/28/2016 01:47 pm              [place] ---------------------------------------------------------------------- Performed By  Performed By:     Tomma Lightning             Ref. Address:     [address]                    [place]                                                             [address]                                                             [place]  [address]  Attending:        Durwin Nora       Location:         [facility]                    MD  Referred By:      Roe Coombs CNM ---------------------------------------------------------------------- Orders   #  Description                                 Code   1  [place] MFM OB FOLLOW UP                         [id number]   2  [place] MFM OB FOLLOW UP ADDL GEST               60454.09  ----------------------------------------------------------------------   #  Ordered By               Order #        Accession #    Episode #   1  MARK NEWMAN              [id number]      [id number]     [id number]   2  MARK NEWMAN              [id number]      [id number]      [id number]  ---------------------------------------------------------------------- Indications   [redacted] weeks gestation of pregnancy                Z3A.22   Twin pregnancy, mono/di, second trimester      O30.032   Short interval between pregancies, 2nd         O09.892   trimester   Obesity complicating pregnancy, second         O99.212   trimester  ---------------------------------------------------------------------- OB History  Blood Type:            Height:  5'11"  Weight (lb):  295      BMI:   41.14  Gravidity:    7         Term:   4        Prem:   0        SAB:   2  TOP:          0       Ectopic:  0        Living: 4 ---------------------------------------------------------------------- Fetal Evaluation (Fetus A)  Num Of Fetuses:     2  Fetal Heart         153  Rate(bpm):  Cardiac Activity:   Observed  Presentation:       Cephalic  Placenta:           Anterior, above cervical os  Amniotic Fluid  AFI FV:      Subjectively within normal limits                              Largest Pocket(cm)                              7.3 ---------------------------------------------------------------------- Biometry (  Fetus A)  BPD:      57.9  mm     G. Age:  23w 5d         78  %    CI:           75   %   70 - 86                                                          FL/HC:      19.1   %   19.2 - 20.8  HC:      212.1  mm     G. Age:  23w 2d         53  %    HC/AC:      1.26       1.05 - 1.21  AC:      167.7  mm     G. Age:  21w 5d         14  %    FL/BPD:     69.9   %   71 - 87  FL:       40.5  mm     G. Age:  23w 1d         47  %    FL/AC:      24.2   %   20 - 24  HUM:      38.5  mm     G. Age:  23w 5d         60  %  Est. FW:     511  gm      1 lb 2 oz     46  %     FW Discordancy      0 \ 1 % ---------------------------------------------------------------------- Gestational Age (Fetus A)  LMP:           22w 6d       Date:   10/20/15                 EDD:   07/26/16  U/S Today:     23w 0d                                        EDD:    07/25/16  Best:          22w 6d    Det. By:   LMP  (10/20/15)          EDD:   07/26/16 ---------------------------------------------------------------------- Anatomy (Fetus A)  Cranium:               Appears normal         Aortic Arch:            Previously seen  Cavum:                 Appears normal         Ductal Arch:            Previously seen  Ventricles:            Appears normal         Diaphragm:  Previously seen  Choroid Plexus:        Previously seen        Stomach:                Appears normal, left                                                                        sided  Cerebellum:            Previously seen        Abdomen:                Appears normal  Posterior Fossa:       Previously seen        Abdominal Wall:         Previously seen  Nuchal Fold:           Previously seen        Cord Vessels:           Previously seen  Face:                  Orbits and profile     Kidneys:                Appear normal                         previously seen  Lips:                  Previously seen        Bladder:                Appears normal  Thoracic:              Appears normal         Spine:                  Previously seen  Heart:                 Appears normal         Upper Extremities:      Previously seen                         (4CH, axis, and si  RVOT:                  Previously seen        Lower Extremities:      Previously seen  LVOT:                  Previously seen  Other:  Fetus appears to be a female. Heels and 5th digit previously visualized.          Nasal bone previously visualized. Open hands previously visualized. ---------------------------------------------------------------------- Fetal Evaluation (Fetus B)  Num Of Fetuses:     2  Fetal Heart         135  Rate(bpm):  Cardiac Activity:   Observed  Presentation:       Transverse, head to maternal left  Placenta:           Anterior, above cervical os  Amniotic Fluid  AFI FV:      Subjectively within normal limits                               Largest Pocket(cm)                              5.2 ---------------------------------------------------------------------- Biometry (Fetus B)  BPD:        54  mm     G. Age:  22w 3d         29  %    CI:        69.08   %   70 - 86                                                          FL/HC:      19.5   %   19.2 - 20.8  HC:      207.5  mm     G. Age:  22w 6d         36  %    HC/AC:      1.22       1.05 - 1.21  AC:      169.5  mm     G. Age:  22w 0d         17  %    FL/BPD:     74.8   %   71 - 87  FL:       40.4  mm     G. Age:  23w 0d         46  %    FL/AC:      23.8   %   20 - 24  HUM:      38.7  mm     G. Age:  23w 5d         63  %  Est. FW:     508  gm      1 lb 2 oz     45  %     FW Discordancy         1  % ---------------------------------------------------------------------- Gestational Age (Fetus B)  LMP:           22w 6d       Date:   10/20/15                 EDD:   07/26/16  U/S Today:     22w 4d                                        EDD:   07/28/16  Best:          22w 6d    Det. By:   LMP  (10/20/15)          EDD:   07/26/16 ---------------------------------------------------------------------- Anatomy (Fetus B)  Cranium:               Appears normal         Aortic Arch:            Previously  seen  Cavum:                 Appears normal         Ductal Arch:            Previously seen  Ventricles:            Appears normal         Diaphragm:              Previously seen  Choroid Plexus:        Previously seen        Stomach:                Appears normal, left                                                                        sided  Cerebellum:            Previously seen        Abdomen:                Appears normal  Posterior Fossa:       Previously seen        Abdominal Wall:         Previously seen  Nuchal Fold:           Previously seen        Cord Vessels:           Previously seen  Face:                  Orbits and profile     Kidneys:                Appear normal                          previously seen  Lips:                  Previously seen        Bladder:                Appears normal  Thoracic:              Appears normal         Spine:                  Previously seen  Heart:                 Previously seen        Upper Extremities:      Previously seen  RVOT:                  Appears normal         Lower Extremities:      Previously seen  LVOT:                  Appears normal  Other:  Fetus appears to be a female. Heels and 5th digit previously visualized.          Open hands previously visualized. Nasal bone previously visualized. ---------------------------------------------------------------------- Cervix Uterus Adnexa  Cervix  Length:  3.49  cm.  Normal appearance by transabdominal scan.  Uterus  No abnormality visualized.  Left Ovary  Not visualized.  Right Ovary  Not visualized. ---------------------------------------------------------------------- Impression   Monochorionic diamniotic twin intrauterine pregnancy at  [redacted]w[redacted]d  active twin fetuses x 2  concordant growth  concordant fluid  no evidence of TTTS  no previa  cervix is long and closed ---------------------------------------------------------------------- Recommendations  TTTS screen (limited ultrasound) in 2 weeks  interval growth monthly  begin antenatal testing at 32 weeks.  delivery at 36-37 weeks (mo-di twins). ----------------------------------------------------------------------               Durwin Nora, MD Electronically Signed Final Report   03/28/2016 04:24 pm ----------------------------------------------------------------------  [place] Mfm Ob Limited  Result Date: 04/10/2016 ----------------------------------------------------------------------  OBSTETRICS REPORT                       (Signed Final 04/10/2016 11:34 pm) ---------------------------------------------------------------------- Patient Info  ID #:       [id number]                          D.O.B.:  12-06-1985 (30 yrs)  Name:       Deboraha Clarke                      Visit Date: 04/09/2016 03:56 pm              [place] ---------------------------------------------------------------------- Performed By  Performed By:     Tomma Lightning             Ref. Address:      [address]                                                              [place]                                                              [address]  Attending:        Particia Nearing MD       Location:          [facility]  Referred By:      Roe Coombs CNM ---------------------------------------------------------------------- Orders   #  Description                                 Code  1  [place] MFM OB LIMITED                           [id number]  ----------------------------------------------------------------------   #  Ordered By               Order #        Accession #    Episode #   1  MARK Ezzard Standing              [id number]      [id number]     [id number]  ---------------------------------------------------------------------- Indications   [redacted] weeks gestation of pregnancy                Z3A.24   Twin pregnancy, mono/di, second trimester;     O30.032   ECHOs normal   Short interval between pregancies, 2nd         O09.892   trimester   Obesity complicating pregnancy, second         O99.212   trimester  ---------------------------------------------------------------------- OB History  Blood Type:            Height:  5'11"  Weight (lb):  295       BMI:  41.14  Gravidity:    7         Term:   4        Prem:   0         SAB:   2  TOP:          0       Ectopic:  0        Living: 4 ---------------------------------------------------------------------- Fetal Evaluation (Fetus A)  Num Of Fetuses:     2  Fetal Heart         158  Rate(bpm):  Cardiac Activity:   Observed  Fetal Lie:          Lower Fetus  Presentation:       Cephalic  Placenta:           Anterior, above cervical os   Amniotic Fluid  AFI FV:      Subjectively within normal limits                              Largest Pocket(cm)                              4.66 ---------------------------------------------------------------------- Gestational Age (Fetus A)  LMP:           24w 4d        Date:  10/20/15                 EDD:    07/26/16  Best:          24w 4d     Det. By:  LMP  (10/20/15)          EDD:    07/26/16 ---------------------------------------------------------------------- Anatomy (Fetus A)  Stomach:               Appears normal, left   Bladder:                Appears normal                         sided ---------------------------------------------------------------------- Fetal Evaluation (Fetus B)  Num Of  Fetuses:     2  Fetal Heart         149  Rate(bpm):  Cardiac Activity:   Observed  Fetal Lie:          Upper Fetus  Presentation:       Cephalic  Placenta:           Anterior, above cervical os  Amniotic Fluid  AFI FV:      Subjectively within normal limits                              Largest Pocket(cm)                              4.73 ---------------------------------------------------------------------- Gestational Age (Fetus B)  LMP:           24w 4d        Date:  10/20/15                 EDD:    07/26/16  Best:          24w 4d     Det. By:  LMP  (10/20/15)          EDD:    07/26/16 ---------------------------------------------------------------------- Anatomy (Fetus B)  Stomach:               Appears normal, left   Bladder:                Appears normal                         sided ---------------------------------------------------------------------- Cervix Uterus Adnexa  Cervix  Length:           3.64  cm.  Normal appearance by transabdominal scan.  Uterus  No abnormality visualized.  Left Ovary  Not visualized.  Right Ovary  Not visualized. ---------------------------------------------------------------------- Impression  Monochorionic/diamniotic twin pregnancy at 24+4 weeks  Normal amniotic fluid volume x 2   No evidence of TTTS ---------------------------------------------------------------------- Recommendations  Growth Korea in 2 weeks ----------------------------------------------------------------------                 Particia Nearing, MD Electronically Signed Final Report   04/10/2016 11:34 pm ----------------------------------------------------------------------   MAU Course: Orders Placed This Encounter  Procedures  . Culture, OB Urine  . Urinalysis, Routine w reflex microscopic  . Diet - low sodium heart healthy  . Increase activity slowly  . Discharge patient Discharge disposition: 01-Home or Self Care; Discharge patient date: 04/15/2016   Meds ordered this encounter  Medications  . DISCONTD: acetaminophen (TYLENOL) tablet 1,000 mg  . acetaminophen (TYLENOL) tablet 1,000 mg  . traMADol (ULTRAM) 50 MG tablet    Sig: Take 1 tablet (50 mg total) by mouth every 6 (six) hours as needed.    Dispense:  10 tablet    Refill:  0    Order Specific Question:   Supervising Provider    Answer:   [place]isbury Bing [id number]    Assessment & Plan: 1. False labor   2. Supervision of high risk pregnancy in second trimester   3. Monochorionic diamniotic twin gestation in second trimester    Fetal strip reassuring for twins w/o contractions. Cervix thick, closed and high. No fluid leakage or bleeding. UA with 30 protein and large leuks but 6-30 squams. No symptoms. Will obtain OB urine culture. Pt w/o signs  of labor. Safe for d/c. --F/u next OB appt --Labor precautions and fetal kick counts reviewed --Discharge home in stable condition  Follow-up Information    Hermina StaggersMichael Clarke Ervin, MD. Go on 04/16/2016.   Specialty:  Obstetrics and Gynecology Why:  Go to appt at 8:30 AM. Contact information: [address] [place] [address] [phone]           Allergies as of 04/15/2016   No Known Allergies     Medication List    TAKE these medications   aspirin EC 81 MG tablet Take 1 tablet (81  mg total) by mouth daily. Take after 12 weeks for prevention of preeclampsia later in pregnancy   butalbital-acetaminophen-caffeine 50-325-40 MG tablet Commonly known as:  FIORICET, ESGIC Take 1-2 tablets by mouth every 6 (six) hours as needed for headache.   cyclobenzaprine 10 MG tablet Commonly known as:  FLEXERIL Take 1 tablet (10 mg total) by mouth 3 (three) times daily as needed for muscle spasms.   Doxylamine-Pyridoxine 10-10 MG Tbec Commonly known as:  DICLEGIS Take 1 tablet with breakfast and lunch.  Take 2 tablets at bedtime.   metroNIDAZOLE 0.75 % vaginal gel Commonly known as:  METROGEL VAGINAL Place 1 Applicatorful vaginally 2 (two) times daily.   ondansetron 8 MG tablet Commonly known as:  ZOFRAN Take 1 tablet (8 mg total) by mouth every 8 (eight) hours as needed for nausea or vomiting.   PRENATE PIXIE 10-0.6-0.4-200 MG Caps Take 1 tablet by mouth daily.   promethazine 25 MG suppository Commonly known as:  PHENERGAN Place 1 suppository (25 mg total) rectally every 6 (six) hours as needed for nausea or vomiting.   traMADol 50 MG tablet Commonly known as:  ULTRAM Take 1 tablet (50 mg total) by mouth every 6 (six) hours as needed.   Vitamin D (Ergocalciferol) 50000 units Caps capsule Commonly known as:  DRISDOL Take 1 capsule (50,000 Units total) by mouth every 7 (seven) days.       Wendee Beaversavid J McMullen, DO, PGY-1 04/15/2016, 6:00 AM  OB FELLOW DISCHARGE ATTESTATION  I confirm that I have verified the information documented in the resident's note and that I have also personally reperformed the physical exam and all medical decision making activities.     Ernestina PennaNicholas Azarion Hove, MD 7:58 AM

## 2016-04-15 NOTE — MAU Note (Signed)
Pt 25 wks with twins with c/o lower abd and low back cramping for the past two nights.  Pt took flexeril the first night with some relief. Denies vag bleeding or leaking.  States less fetal movement than normal.

## 2016-04-15 NOTE — Discharge Instructions (Signed)

## 2016-04-16 ENCOUNTER — Encounter: Payer: Self-pay | Admitting: Obstetrics and Gynecology

## 2016-04-16 LAB — CULTURE, OB URINE

## 2016-04-19 ENCOUNTER — Encounter (HOSPITAL_COMMUNITY): Payer: Self-pay | Admitting: Emergency Medicine

## 2016-04-19 ENCOUNTER — Emergency Department (HOSPITAL_COMMUNITY)
Admission: EM | Admit: 2016-04-19 | Discharge: 2016-04-20 | Disposition: A | Discharge status: Other Acute Inpatient Hospital | Payer: Medicaid Other | Attending: Emergency Medicine | Admitting: Emergency Medicine

## 2016-04-19 ENCOUNTER — Emergency Department (HOSPITAL_COMMUNITY): Payer: Medicaid Other

## 2016-04-19 DIAGNOSIS — Y9259 Other trade areas as the place of occurrence of the external cause: Secondary | ICD-10-CM | POA: Insufficient documentation

## 2016-04-19 DIAGNOSIS — Z87891 Personal history of nicotine dependence: Secondary | ICD-10-CM | POA: Diagnosis not present

## 2016-04-19 DIAGNOSIS — Z3A26 26 weeks gestation of pregnancy: Secondary | ICD-10-CM | POA: Insufficient documentation

## 2016-04-19 DIAGNOSIS — S93491A Sprain of other ligament of right ankle, initial encounter: Secondary | ICD-10-CM

## 2016-04-19 DIAGNOSIS — Y999 Unspecified external cause status: Secondary | ICD-10-CM | POA: Diagnosis not present

## 2016-04-19 DIAGNOSIS — W0110XA Fall on same level from slipping, tripping and stumbling with subsequent striking against unspecified object, initial encounter: Secondary | ICD-10-CM | POA: Diagnosis not present

## 2016-04-19 DIAGNOSIS — S93492A Sprain of other ligament of left ankle, initial encounter: Secondary | ICD-10-CM | POA: Diagnosis not present

## 2016-04-19 DIAGNOSIS — O9A212 Injury, poisoning and certain other consequences of external causes complicating pregnancy, second trimester: Secondary | ICD-10-CM | POA: Diagnosis present

## 2016-04-19 DIAGNOSIS — O9989 Other specified diseases and conditions complicating pregnancy, childbirth and the puerperium: Secondary | ICD-10-CM | POA: Diagnosis not present

## 2016-04-19 DIAGNOSIS — Y9389 Activity, other specified: Secondary | ICD-10-CM | POA: Diagnosis not present

## 2016-04-19 DIAGNOSIS — W19XXXA Unspecified fall, initial encounter: Secondary | ICD-10-CM | POA: Diagnosis not present

## 2016-04-19 MED ORDER — ACETAMINOPHEN 500 MG PO TABS
1000.0000 mg | ORAL_TABLET | Freq: Once | ORAL | Status: AC
  Administered 2016-04-19: 1000 mg via ORAL
  Filled 2016-04-19: qty 2

## 2016-04-19 NOTE — Progress Notes (Signed)
Updated Dr. Delfin Gant on patient status.  Advised waiting on ankle xray. Plan to transfer to MAU for continued monitoring after ankle xray cleared.

## 2016-04-19 NOTE — Progress Notes (Addendum)
Chaplain responded to page for patient who is pregnant and has fallen.  After patient was settled in bay, Chaplain approached bedside of patient to let patient know of Chaplain presence.  Chaplain offered prayer and prayed with the patient for her care, the care of the unborn babies, the doctors and nurses that will be caring for her.  Patient appreciative.  Patient appropriately teary.  Nurses present in trauma room.   Chaplain checked in with patient once patient returned from X-Ray.  Patient said she was doing better since babies heartbeats was heard.  Patient thanked Orthoptist for coming.  Chaplain thanked patient for allowing her to be there.  Chaplain would like to thank the staff for their care for this patient.   04/19/16 2242  Clinical Encounter Type  Visited With Patient  Visit Type Initial;Spiritual support;Social support;Trauma  Spiritual Encounters  Spiritual Needs Emotional;Prayer   Jacqueline Clarke

## 2016-04-19 NOTE — ED Provider Notes (Signed)
[facility]-EMERGENCY DEPT Provider Note   CSN: [id number] Arrival date & time: 04/19/16  2210     History   Chief Complaint Chief Complaint  Patient presents with  . Fall    HPI Jacqueline Clarke is a 31 y.o. female.  Patient presents to the ED with a chief complaint of fall.  She is [redacted] weeks pregnant with twins.  She states that she slipped on some pickle juice at [facility] and fell landing on her buttock and striking her head on the shelf.  She denies any LOC.  She complains of moderate low back pain, mild lower abdominal pain, and right ankle pain.  She had a brief nosebleed, which has resolved.  She has a history of the same.  She denies any gush of fluids.  She did not land on her belly.  There are no other associated symptoms.  The pain in her low back, hip, and ankle are reproducible with palpation.   The history is provided by the patient. No language interpreter was used.    No past medical history on file.  There are no active problems to display for this patient.   Past Surgical History:  Procedure Laterality Date  . CHOLECYSTECTOMY    . MR WRIST RIGHT     sx to nerves  . TONSILLECTOMY AND ADENOIDECTOMY      OB History    Gravida Para Term Preterm AB Living   1             SAB TAB Ectopic Multiple Live Births                   Home Medications    Prior to Admission medications   Not on File    Family History No family history on file.  Social History Social History  Substance Use Topics  . Smoking status: Former Smoker    Types: Cigarettes    Quit date: 10/29/2015  . Smokeless tobacco: Never Used  . Alcohol use No     Allergies   Patient has no allergy information on record.   Review of Systems Review of Systems  Gastrointestinal: Positive for abdominal pain.  Musculoskeletal: Positive for arthralgias, back pain and myalgias.  All other systems reviewed and are negative.    Physical Exam Updated Vital Signs BP 108/80 (BP Location:  Left Arm)   Pulse 97   Temp 98.8 F (37.1 C) (Oral)   Resp 18   Ht  (1.803 m)   Wt (!) 137 kg   SpO2 100%   BMI 42.12 kg/m   Physical Exam  Constitutional: She is oriented to person, place, and time. She appears well-developed and well-nourished.  HENT:  Head: Normocephalic and atraumatic.  Eyes: Conjunctivae and EOM are normal. Pupils are equal, round, and reactive to light.  Neck: Normal range of motion. Neck supple.  Cardiovascular: Normal rate and regular rhythm.  Exam reveals no gallop and no friction rub.   No murmur heard. Pulmonary/Chest: Effort normal and breath sounds normal. No respiratory distress. She has no wheezes. She has no rales. She exhibits no tenderness.  Abdominal: Soft. Bowel sounds are normal. She exhibits no distension and no mass. There is no tenderness. There is no rebound and no guarding.  No focal abdominal tenderness  Musculoskeletal: Normal range of motion. She exhibits no edema or tenderness.  CTLS spine:  No bony deformity or abnormality, mild right cervical paraspinal muscles tenderness   Right Ankle: No bony abnormality or deformity,  ROM and strength 5/5, moderate TTP over lateral malleolus   Neurological: She is alert and oriented to person, place, and time.  Skin: Skin is warm and dry.  Psychiatric: She has a normal mood and affect. Her behavior is normal. Judgment and thought content normal.  Nursing note and vitals reviewed.    ED Treatments / Results  Labs (all labs ordered are listed, but only abnormal results are displayed) Labs Reviewed - No data to display  EKG  EKG Interpretation None       Radiology No results found.  Procedures Procedures (including critical care time)  Medications Ordered in ED Medications - No data to display   Initial Impression / Assessment and Plan / ED Course  I have reviewed the triage vital signs and the nursing notes.  Pertinent labs & imaging results that were available during  my care of the patient were reviewed by me and considered in my medical decision making (see chart for details).     Patient who is [redacted] weeks pregnant with twins fell and landed on buttocks tonight at [place].  No LOC.  She didn't land on her belly.  No fluid gush or suspected rupture of membranes.  Normal fetal heart rates for the twins 156 and 160. Patient on fetal monitor. Rapid response OBGYN RN is bedside.   She has some arthralgias and myalgias, but I have a low suspicion of any fracture.  Possible right ankle sprain.  Will check imaging of ankle.    Brief epistaxis, which has resolved now.  Normal BP.  10:50 PM Rapid response team recommends observation and [place] at [facility].  X-rays negative for fracture. Will give ankle ASO for sprain. Transfer to women's for ongoing monitoring.  Final Clinical Impressions(s) / ED Diagnoses   Final diagnoses:  Fall, initial encounter  Sprain of anterior talofibular ligament of right ankle, initial encounter    New Prescriptions New Prescriptions   No medications on file     Roxy Horseman, PA-C 04/20/16 0006    Linwood Dibbles, MD 04/20/16 1340

## 2016-04-19 NOTE — Progress Notes (Signed)
Called to see patient 26weeks twin gestation, G7P4, last baby ( months ago. Patient fell, landed on back/bottom. C/O lower abd pain/cramping. Denies hitting abd, denies bleeding or leaking of fluid. Awaiting ankle xray currently. EFM applied, assessing.

## 2016-04-19 NOTE — ED Notes (Signed)
Taken to xray at this time. 

## 2016-04-19 NOTE — ED Triage Notes (Signed)
Brought by [facility] from [employer].  Reports slipping up in pickle juice landing on bottom.  Hit back of head.  c/o pain in lower back, right hip and right ankle.  No LOC reported.  Nosebleed otw to the hospital.  Stopped on arrival.  Hx of htn with end of last pregnancy and nosebleeds.

## 2016-04-20 ENCOUNTER — Encounter (HOSPITAL_COMMUNITY): Payer: Self-pay | Admitting: *Deleted

## 2016-04-20 ENCOUNTER — Inpatient Hospital Stay (HOSPITAL_COMMUNITY): Payer: Medicaid Other

## 2016-04-20 ENCOUNTER — Inpatient Hospital Stay (EMERGENCY_DEPARTMENT_HOSPITAL)
Admission: AD | Admit: 2016-04-20 | Discharge: 2016-04-20 | Disposition: A | Discharge status: Home / Self Care | Payer: Medicaid Other | Source: Ambulatory Visit | Attending: Obstetrics & Gynecology | Admitting: Obstetrics & Gynecology

## 2016-04-20 DIAGNOSIS — R102 Pelvic and perineal pain: Secondary | ICD-10-CM

## 2016-04-20 DIAGNOSIS — R109 Unspecified abdominal pain: Secondary | ICD-10-CM

## 2016-04-20 DIAGNOSIS — O9989 Other specified diseases and conditions complicating pregnancy, childbirth and the puerperium: Secondary | ICD-10-CM

## 2016-04-20 DIAGNOSIS — O26892 Other specified pregnancy related conditions, second trimester: Secondary | ICD-10-CM

## 2016-04-20 DIAGNOSIS — W19XXXA Unspecified fall, initial encounter: Secondary | ICD-10-CM

## 2016-04-20 MED ORDER — IBUPROFEN 600 MG PO TABS
600.0000 mg | ORAL_TABLET | Freq: Once | ORAL | Status: AC
  Administered 2016-04-20: 600 mg via ORAL
  Filled 2016-04-20: qty 1

## 2016-04-20 MED ORDER — TRAMADOL HCL 50 MG PO TABS: 50.0000 mg | ORAL_TABLET | Freq: Four times a day (QID) | ORAL | 0 refills | Status: DC | PRN

## 2016-04-20 MED ORDER — CYCLOBENZAPRINE HCL 5 MG PO TABS
5.0000 mg | ORAL_TABLET | Freq: Once | ORAL | Status: AC
  Administered 2016-04-20: 5 mg via ORAL
  Filled 2016-04-20: qty 1

## 2016-04-20 NOTE — Progress Notes (Signed)
J Rasch Np notified of pt's pain level after ibuprofen. NP will speak with attending regarding plan of care

## 2016-04-20 NOTE — Progress Notes (Signed)
Patient awaiting transfer to MAU. MAU RN updated on patient and pending transfer.

## 2016-04-20 NOTE — ED Notes (Signed)
Leaving with [facility] at this time. 

## 2016-04-20 NOTE — MAU Note (Signed)
Pt startd c/o increased pelvic pain and back pain over the past 30 min. .Notified J.Rasch,NP.  Cervical exam done , closed/thick/posterior . Flexeril orderd and U/S .

## 2016-04-20 NOTE — Discharge Instructions (Signed)
WARM-UP General Tips  In warm-up you are moving body parts in an attempt to prepare the body for more vigorous aerobic exercise. Don't move body parts beyond a comfortable range of motion. No exercise should cause pain. "Hold one count" on the exercise directions means the time it takes for you to say, "One thousand one."  Breathe in a comfortable rhythmic manner.  Copyright  VHI. All rights reserved.

## 2016-04-20 NOTE — ED Notes (Signed)
Able to bear weight on right ankle.  Splint applied per order.

## 2016-04-20 NOTE — Progress Notes (Signed)
J Rasch NP notified of pt's u/s results and pain level. Will give Ibuprofen and regular diet

## 2016-04-20 NOTE — Progress Notes (Signed)
Baby B difficult to monitor due to gestation, maternal habitus, FM. Trans adj

## 2016-04-20 NOTE — Progress Notes (Signed)
Pt points to pubic area when stating where pain is and also states vaginal area hurts

## 2016-04-20 NOTE — MAU Provider Note (Signed)
History     CSN: [id number]  Arrival date and time: 04/20/16 0114   None     No chief complaint on file.  HPI   Jacqueline Clarke is a 31 y.o. female [phone] @ [redacted]w[redacted]d mono/di twins here for further monitoring. She slipped and fell on some pickle juice at [facility] yesterday evening. She presented to [facility] ED where she had a full work up done. She was sent here for further fetal monitoring. She did not land directly on her abdomen, however did fall "hard" onto her buttocks.  She denies abdominal pain, or vaginal bleeding She is having some pelvic pressure, however that is not a new complaint.   OB History    Gravida Para Term Preterm AB Living   0 2 4   SAB TAB Ectopic Multiple Live Births   2 0 0 0 4      Past Medical History:  Diagnosis Date  . Anemia   . Anxiety   . Asthma   . Blood type, Rh negative   . Cholelithiasis   . Chronic back pain    scoliosis- on percocet  . Depression   . Headache(784.0)   . Hyperemesis complicating pregnancy, antepartum 2013  . 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 Obesity   . Pregnancy induced hypertension   . Scoliosis     Past Surgical History:  Procedure Laterality Date  . ADENOIDECTOMY    . ADENOIDECTOMY    . arm surgery    . CHOLECYSTECTOMY    . DILATION AND CURETTAGE OF UTERUS  2008  . NERVE, TENDON AND ARTERY REPAIR Right 06/18/2014   Procedure: NERVE, TENDON AND ARTERY REPAIR;  Surgeon: Bradly Bienenstock, MD;  Location: [facility] OR;  Service: Orthopedics;  Laterality: Right;    Family History  Problem Relation Age of Onset  . Heart disease Maternal Grandfather   . Diabetes Maternal Grandfather   . Heart disease Father   . Anesthesia problems Neg Hx     Social History  Substance Use Topics  . Smoking status: Former Smoker    Packs/day: 0.50    Types: Cigarettes  . Smokeless tobacco: Never Used  . Alcohol use No     Comment: Occassionally    Allergies: No Known Allergies  Prescriptions Prior  to Admission  Medication Sig Dispense Refill Last Dose  . aspirin EC 81 MG tablet Take 1 tablet (81 mg total) by mouth daily. Take after 12 weeks for prevention of preeclampsia later in pregnancy 300 tablet 2 04/14/2016 at Unknown time  . butalbital-acetaminophen-caffeine (FIORICET, ESGIC) 50-325-40 MG tablet Take 1-2 tablets by mouth every 6 (six) hours as needed for headache. (Patient not taking: Reported on 04/09/2016) 45 tablet 4 Not Taking  . cyclobenzaprine (FLEXERIL) 10 MG tablet Take 1 tablet (10 mg total) by mouth 3 (three) times daily as needed for muscle spasms. 30 tablet 3 04/14/2016 at Unknown time  . Doxylamine-Pyridoxine (DICLEGIS) 10-10 MG TBEC Take 1 tablet with breakfast and lunch.  Take 2 tablets at bedtime. 100 tablet 4 Past Month at Unknown time  . metroNIDAZOLE (METROGEL VAGINAL) 0.75 % vaginal gel Place 1 Applicatorful vaginally 2 (two) times daily. (Patient not taking: Reported on 02/26/2016) 70 g 0 Not Taking  . ondansetron (ZOFRAN) 8 MG tablet Take 1 tablet (8 mg total) by mouth every 8 (eight) hours as needed for nausea or vomiting. (Patient not taking: Reported on 02/26/2016) 40 tablet 2 Not Taking  .  Prenat-FeAsp-Meth-FA-DHA w/o A (PRENATE PIXIE) 10-0.6-0.4-200 MG CAPS Take 1 tablet by mouth daily. (Patient not taking: Reported on 03/27/2016) 30 capsule 12 Not Taking  . promethazine (PHENERGAN) 25 MG suppository Place 1 suppository (25 mg total) rectally every 6 (six) hours as needed for nausea or vomiting. (Patient not taking: Reported on 02/26/2016) 12 each 0 Not Taking  . traMADol (ULTRAM) 50 MG tablet Take 1 tablet (50 mg total) by mouth every 6 (six) hours as needed. 10 tablet 0   . Vitamin D, Ergocalciferol, (DRISDOL) 50000 units CAPS capsule Take 1 capsule (50,000 Units total) by mouth every 7 (seven) days. (Patient not taking: Reported on 02/26/2016) 30 capsule 2 Not Taking   No results found for this or any previous visit (from the past 48 hour(s)).    No results  found.  Review of Systems  Gastrointestinal: Positive for abdominal pain.  Genitourinary: Negative for dysuria and vaginal bleeding.   Physical Exam   Blood pressure 108/70, pulse 96, temperature 98.1 F (36.7 C), resp. rate 18, last menstrual period 10/20/2015, currently breastfeeding.  Physical Exam  Constitutional: She is oriented to person, place, and time. She appears well-developed and well-nourished. No distress.  Morbidly obese female   HENT:  Head: Normocephalic.  GI: Soft. Normal appearance. There is no rigidity, no rebound and no guarding.    Symphysis pubis pain with palpation.   Genitourinary:  Genitourinary Comments: Dilation: Closed Effacement (%): Thick Cervical Position: Posterior  Musculoskeletal: Normal range of motion.  Neurological: She is alert and oriented to person, place, and time.  Skin: Skin is warm. She is not diaphoretic.  Psychiatric: Her behavior is normal.  Fetal tracing: 15x15 accels on both fetus, no decels, no contractions.   MAU Course  Procedures  None  MDM  0310: Called to the bedside, patient is experiencing strong pelvic pain/pubic pain. The pain Is in her vagina. The pain is shooting. Cervix checked: closed, no contractions noted on the monitor. Flexeril & [place] ordered. Warm pack applied to area.  Flexeril with only minimal relief, ibuprofen given.  Pain improved with medication. Discussed with Dr. Lovett Sox.  US WNL: No sign of abruption.  Assessment and Plan   A:  1. Fall, initial encounter   2. Abdominal pain during pregnancy in second trimester   3. Pelvic pain affecting pregnancy in second trimester, antepartum     P:  Discharge home in stable condition Strict return precautions Rx: Ultram, #6 no refills Follow up with Ob as scheduled or sooner if needed Fall precautions  Duane Lope, NP 04/21/2016 8:18 AM

## 2016-04-20 NOTE — Progress Notes (Signed)
J. Rasch NP in to discuss d/c plan. Written and verbal d/c instructions given and understanding voiced. 

## 2016-04-20 NOTE — MAU Note (Signed)
Pt transferred from [facility] after having a fall at [facility]. Has brace to right foot that was placed by MD at [facility]. C/o  Pain to her lower back and abd.

## 2016-04-21 ENCOUNTER — Encounter (HOSPITAL_COMMUNITY): Payer: Self-pay | Admitting: *Deleted

## 2016-04-21 ENCOUNTER — Inpatient Hospital Stay (HOSPITAL_COMMUNITY)
Admission: AD | Admit: 2016-04-21 | Discharge: 2016-04-22 | Disposition: A | Discharge status: Home / Self Care | Payer: Medicaid Other | Source: Ambulatory Visit | Attending: Obstetrics & Gynecology | Admitting: Obstetrics & Gynecology

## 2016-04-21 ENCOUNTER — Encounter (HOSPITAL_COMMUNITY): Payer: Self-pay

## 2016-04-21 DIAGNOSIS — Z79899 Other long term (current) drug therapy: Secondary | ICD-10-CM | POA: Diagnosis not present

## 2016-04-21 DIAGNOSIS — Z3A26 26 weeks gestation of pregnancy: Secondary | ICD-10-CM | POA: Diagnosis not present

## 2016-04-21 DIAGNOSIS — Z87891 Personal history of nicotine dependence: Secondary | ICD-10-CM | POA: Insufficient documentation

## 2016-04-21 DIAGNOSIS — Z7982 Long term (current) use of aspirin: Secondary | ICD-10-CM | POA: Diagnosis not present

## 2016-04-21 DIAGNOSIS — R109 Unspecified abdominal pain: Secondary | ICD-10-CM | POA: Diagnosis not present

## 2016-04-21 DIAGNOSIS — O9989 Other specified diseases and conditions complicating pregnancy, childbirth and the puerperium: Secondary | ICD-10-CM | POA: Diagnosis not present

## 2016-04-21 DIAGNOSIS — W19XXXD Unspecified fall, subsequent encounter: Secondary | ICD-10-CM

## 2016-04-21 DIAGNOSIS — O0992 Supervision of high risk pregnancy, unspecified, second trimester: Secondary | ICD-10-CM | POA: Diagnosis not present

## 2016-04-21 DIAGNOSIS — O26892 Other specified pregnancy related conditions, second trimester: Secondary | ICD-10-CM | POA: Insufficient documentation

## 2016-04-21 DIAGNOSIS — O30032 Twin pregnancy, monochorionic/diamniotic, second trimester: Secondary | ICD-10-CM | POA: Diagnosis not present

## 2016-04-21 DIAGNOSIS — R1030 Lower abdominal pain, unspecified: Secondary | ICD-10-CM | POA: Insufficient documentation

## 2016-04-21 DIAGNOSIS — O4702 False labor before 37 completed weeks of gestation, second trimester: Secondary | ICD-10-CM | POA: Diagnosis not present

## 2016-04-21 LAB — URINALYSIS, ROUTINE W REFLEX MICROSCOPIC
BILIRUBIN URINE: NEGATIVE
Glucose, UA: NEGATIVE mg/dL
HGB URINE DIPSTICK: NEGATIVE
Ketones, ur: NEGATIVE mg/dL
NITRITE: NEGATIVE
Protein, ur: NEGATIVE mg/dL
SPECIFIC GRAVITY, URINE: 1.024 (ref 1.005–1.030)
pH: 6 (ref 5.0–8.0)

## 2016-04-21 LAB — CBC WITH DIFFERENTIAL/PLATELET
Basophils Absolute: 0 10*3/uL (ref 0.0–0.1)
Basophils Relative: 0 %
EOS ABS: 0.2 10*3/uL (ref 0.0–0.7)
EOS PCT: 1 %
HCT: 32.1 % — ABNORMAL LOW (ref 36.0–46.0)
HEMOGLOBIN: 10.2 g/dL — AB (ref 12.0–15.0)
LYMPHS ABS: 2 10*3/uL (ref 0.7–4.0)
Lymphocytes Relative: 17 %
MCH: 26.4 pg (ref 26.0–34.0)
MCHC: 31.8 g/dL (ref 30.0–36.0)
MCV: 82.9 fL (ref 78.0–100.0)
MONOS PCT: 6 %
Monocytes Absolute: 0.8 10*3/uL (ref 0.1–1.0)
NEUTROS PCT: 76 %
Neutro Abs: 9 10*3/uL — ABNORMAL HIGH (ref 1.7–7.7)
Platelets: 231 10*3/uL (ref 150–400)
RBC: 3.87 MIL/uL (ref 3.87–5.11)
RDW: 15.3 % (ref 11.5–15.5)
WBC: 12 10*3/uL — AB (ref 4.0–10.5)

## 2016-04-21 LAB — COMPREHENSIVE METABOLIC PANEL
ALK PHOS: 108 U/L (ref 38–126)
ALT: 20 U/L (ref 14–54)
ANION GAP: 6 (ref 5–15)
AST: 31 U/L (ref 15–41)
Albumin: 2.9 g/dL — ABNORMAL LOW (ref 3.5–5.0)
BUN: 8 mg/dL (ref 6–20)
CALCIUM: 8.8 mg/dL — AB (ref 8.9–10.3)
CO2: 26 mmol/L (ref 22–32)
CREATININE: 0.6 mg/dL (ref 0.44–1.00)
Chloride: 106 mmol/L (ref 101–111)
Glucose, Bld: 81 mg/dL (ref 65–99)
Potassium: 3.4 mmol/L — ABNORMAL LOW (ref 3.5–5.1)
Sodium: 138 mmol/L (ref 135–145)
TOTAL PROTEIN: 8.1 g/dL (ref 6.5–8.1)
Total Bilirubin: 0.3 mg/dL (ref 0.3–1.2)

## 2016-04-21 MED ORDER — DIPHENHYDRAMINE HCL 50 MG/ML IJ SOLN
25.0000 mg | Freq: Once | INTRAMUSCULAR | Status: AC
  Administered 2016-04-21: 25 mg via INTRAVENOUS
  Filled 2016-04-21: qty 1

## 2016-04-21 MED ORDER — METOCLOPRAMIDE HCL 5 MG/ML IJ SOLN
10.0000 mg | Freq: Once | INTRAMUSCULAR | Status: AC
  Administered 2016-04-21: 10 mg via INTRAVENOUS
  Filled 2016-04-21: qty 2

## 2016-04-21 MED ORDER — LACTATED RINGERS IV BOLUS (SEPSIS)
500.0000 mL | Freq: Once | INTRAVENOUS | Status: AC
  Administered 2016-04-21: 500 mL via INTRAVENOUS

## 2016-04-21 NOTE — Progress Notes (Signed)
[facility] arrived to bedside of patient who presents to [facility] ED with complaints of headache, lower abdominal pain, ankle pain and back pain that became worse 3 hours ago; patient stated she fell Saturday night around 2130 in [employer] due to the floor being wet; she stated she fell and hit her head on rail and fell onto her hip/side to the floor; she went to [facility] ED immediately after for further evaluation, was medically cleared there and then was taken to [facility] for further evaluation and was told if she started to have more pain or it got worse to come back to the hospital; patient stated her pain suddenly started to get worse  Throughout the afternoon and she started having another nose bleed and sharp lower abdominal that radiated to the right; upon arrival EFM applied

## 2016-04-21 NOTE — ED Notes (Signed)
[facility] dispatch notified for need of transport. Was advised that it would be within the hour before a truck would be available.

## 2016-04-21 NOTE — Progress Notes (Signed)
RROB called about patients arrival to [facility] ED

## 2016-04-21 NOTE — Progress Notes (Signed)
[facility] at Bedside; EFM removed at this time

## 2016-04-21 NOTE — Progress Notes (Addendum)
Dr Debroah Loop notified of patient's arrival to ED and complaints at this time; also notified of pain and Urine results from previous ED visit; orders given for repeat UA and culture, CBC, CMP, SVE, and transfer to [facility] when medically clear for further evaluation; ED provider and RN made aware of orders

## 2016-04-21 NOTE — Progress Notes (Signed)
Amber, RN in MAU called with report on patient at this time

## 2016-04-21 NOTE — ED Provider Notes (Signed)
[facility]-EMERGENCY DEPT Provider Note   CSN: [id number] Arrival date & time: 04/21/16  1907     History   Chief Complaint Chief Complaint  Patient presents with  . Fall    HPI Jacqueline Clarke is a 31 y.o. female.  Patient, who is [redacted] weeks pregnant with twins, presents with 4hr history of lower abdominal pain rated as 10/10 and radiating to her back. Denies vaginal bleeding, discharge or leakage of fluids. She was seen at [facility] ED 2 days ago after falling and hitting her head and R hip and ankle.  She has been experiencing HA, nosebleeds and continued hip and ankle pain since the fall. She reports seeing "spots" in her eyes today but denies blurry vision. Her last nosebleed was 5 hours ago and reports seeing clots. Denies hitting her stomach during fall. Minimal relief with Flexeril and Tylenol.  Denies chest pain, SOB, hemoptysis, dysuria, hematuria, blurry vision, increased leg or arm swelling. No HTN.      Past Medical History:  Diagnosis Date  . Anemia   . Anxiety   . Asthma   . Blood type, Rh negative   . Cholelithiasis   . Chronic back pain    scoliosis- on percocet  . Depression   . Headache(784.0)   . Hyperemesis complicating pregnancy, antepartum 2013  . 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 Obesity   . Pregnancy induced hypertension   . Scoliosis     Patient Active Problem List   Diagnosis Date Noted  . Encounter for procreative genetic counseling   . Low vitamin D level 01-16-16  . 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 Supervision of high-risk pregnancy 12/26/2015  . Monochorionic diamniotic twin gestation 12/19/2015  . Depression 07/09/2015    Past Surgical History:  Procedure Laterality Date  . ADENOIDECTOMY    . ADENOIDECTOMY    . arm surgery    . CHOLECYSTECTOMY    . DILATION AND CURETTAGE OF UTERUS  2008  . MR WRIST RIGHT     sx to nerves  . NERVE, TENDON AND ARTERY REPAIR Right 06/18/2014   Procedure:  NERVE, TENDON AND ARTERY REPAIR;  Surgeon: Bradly Bienenstock, MD;  Location: [facility] OR;  Service: Orthopedics;  Laterality: Right;  . TONSILLECTOMY AND ADENOIDECTOMY      OB History    Gravida Para Term Preterm AB Living   0 2 4   SAB TAB Ectopic Multiple Live Births   2 0 0   4       Home Medications    Prior to Admission medications   Medication Sig Start Date End Date Taking? Authorizing Provider  aspirin EC 81 MG tablet Take 1 tablet (81 mg total) by mouth daily. Take after 12 weeks for prevention of preeclampsia later in pregnancy 03/27/16  Yes Ugonna A Anyanwu, MD  Doxylamine-Pyridoxine (DICLEGIS) 10-10 MG TBEC Take 1 tablet with breakfast and lunch.  Take 2 tablets at bedtime. 12/26/15  Yes Rachelle A Denney, CNM  traMADol (ULTRAM) 50 MG tablet Take 1 tablet (50 mg total) by mouth every 6 (six) hours as needed. 04/20/16  Yes Harolyn Rutherford Rasch, NP  Prenat-FeAsp-Meth-FA-DHA w/o A (PRENATE PIXIE) 10-0.6-0.4-200 MG CAPS Take 1 tablet by mouth daily. Patient not taking: Reported on 03/27/2016 01/23/16   Roe Coombs, CNM  Vitamin D, Ergocalciferol, (DRISDOL) 50000 units CAPS capsule Take 1 capsule (50,000 Units total) by mouth every 7 (seven) days. Patient not taking: Reported  on 02/26/2016 01/03/16   Roe Coombs, CNM    Family History Family History  Problem Relation Age of Onset  . Heart disease Maternal Grandfather   . Diabetes Maternal Grandfather   . Heart disease Father   . Anesthesia problems Neg Hx     Social History Social History  Substance Use Topics  . Smoking status: Former Smoker    Packs/day: 0.50    Types: Cigarettes  . Smokeless tobacco: Never Used  . Alcohol use No     Comment: Occassionally     Allergies   Patient has no known allergies.   Review of Systems Review of Systems  Constitutional: Negative for chills and fever.  Eyes: Positive for visual disturbance. Negative for photophobia.  Respiratory: Positive for cough. Negative for chest  tightness, shortness of breath and wheezing.   Cardiovascular: Negative for chest pain and palpitations.  Gastrointestinal: Positive for abdominal pain. Negative for blood in stool, constipation, diarrhea, nausea and vomiting.  Genitourinary: Negative for dysuria, hematuria, urgency, vaginal bleeding, vaginal discharge and vaginal pain.  Musculoskeletal: Positive for back pain, joint swelling and myalgias.  Skin: Negative for rash.  Neurological: Positive for headaches. Negative for dizziness, weakness and light-headedness.  All other systems reviewed and are negative.    Physical Exam Updated Vital Signs BP 113/67 (BP Location: Left Arm)   Pulse 94   Temp 98.4 F (36.9 C) (Oral)   Resp (!) 22   Ht  (1.803 m)   Wt (!) 141.1 kg   LMP 10/20/2015 (Approximate)   SpO2 100%   BMI 43.39 kg/m   Physical Exam  Constitutional: She is oriented to person, place, and time. She appears well-developed and well-nourished. No distress.  HENT:  Head: Normocephalic and atraumatic.  Nose: Nose normal.  Eyes: Conjunctivae and EOM are normal. Pupils are equal, round, and reactive to light. Right eye exhibits no discharge. Left eye exhibits no discharge. No scleral icterus.  Neck: Normal range of motion. Neck supple.  Cardiovascular: Regular rhythm, normal heart sounds and intact distal pulses.  Tachycardia present.  Exam reveals no gallop and no friction rub.   No murmur heard. Pulmonary/Chest: Effort normal and breath sounds normal. No respiratory distress.  Abdominal: Soft. Bowel sounds are normal. She exhibits no distension. There is tenderness (Suprapubic). There is no guarding.  Musculoskeletal: Normal range of motion. She exhibits edema (R ankle edema with brace in place.) and tenderness (R hip and ankle).  Neurological: She is alert and oriented to person, place, and time. No sensory deficit. She exhibits normal muscle tone. Coordination normal.  Skin: Skin is warm and dry. No rash  noted. She is not diaphoretic.  Psychiatric: She has a normal mood and affect.  Nursing note and vitals reviewed.    ED Treatments / Results  Labs (all labs ordered are listed, but only abnormal results are displayed) Labs Reviewed  URINALYSIS, ROUTINE W REFLEX MICROSCOPIC - Abnormal; Notable for the following:       Result Value   APPearance HAZY (*)    Leukocytes, UA LARGE (*)    Bacteria, UA RARE (*)    Squamous Epithelial / LPF 6-30 (*)    All other components within normal limits  CBC WITH DIFFERENTIAL/PLATELET - Abnormal; Notable for the following:    WBC 12.0 (*)    Hemoglobin 10.2 (*)    HCT 32.1 (*)    Neutro Abs 9.0 (*)    All other components within normal limits  COMPREHENSIVE METABOLIC PANEL -  Abnormal; Notable for the following:    Potassium 3.4 (*)    Calcium 8.8 (*)    Albumin 2.9 (*)    All other components within normal limits  URINE CULTURE    EKG  EKG Interpretation None       Radiology Dg Ankle Complete Right  Result Date: 04/19/2016 CLINICAL DATA:  Right ankle pain after injury EXAM: RIGHT ANKLE - COMPLETE 3+ VIEW COMPARISON:  None. FINDINGS: Mild diffuse soft tissue swelling. No fracture or subluxation. No suspicious focal osseous lesion. No appreciable arthropathy. Small plantar right calcaneal spur. No radiopaque foreign body. IMPRESSION: Mild diffuse right ankle soft tissue swelling, with no fracture or subluxation. Electronically Signed   By: Delbert Phenix M.D.   On: 04/19/2016 23:55    Procedures Procedures (including critical care time)  Medications Ordered in ED Medications  lactated ringers bolus 500 mL (0 mLs Intravenous Stopped 04/21/16 2233)  metoCLOPramide (REGLAN) injection 10 mg (10 mg Intravenous Given 04/21/16 2219)  diphenhydrAMINE (BENADRYL) injection 25 mg (25 mg Intravenous Given 04/21/16 2219)     Initial Impression / Assessment and Plan / ED Course  I have reviewed the triage vital signs and the nursing notes.  Pertinent  labs & imaging results that were available during my care of the patient were reviewed by me and considered in my medical decision making (see chart for details).     Patient was put on monitor for FHT. Both babies with HR in 160-170s. IV fluids given. No concern for pre-eclampsia or eclampsia due to absence of protein in urine and no HTN. Patient is not experiencing vaginal bleeding, discharge or leakage of fluids. There does not appear to be any fetal distress.  UA showed large leuks and rare bacteria. Culture was sent. CBC showed leukocytosis at 12.0. CMP was unremarkable. Given Reglan and benadryl in ED for alleviation of headache.  RN states patient is having contractions. Patient will be admitted to [facility] for observation.   Final Clinical Impressions(s) / ED Diagnoses   Final diagnoses:  None    New Prescriptions New Prescriptions   No medications on file     Dietrich Pates, Cordelia Poche 04/21/16 2243    Shaune Pollack, MD 04/23/16 1344

## 2016-04-21 NOTE — ED Triage Notes (Signed)
Pt states she had a fall on Saturday in [employer]  Pt states she hit her head and her right hip  Pt states she continues to have burning stabbing pain in her right hip  Pt is c/o headache and states has been having nosebleeds since the fall   Pt states she is [redacted] weeks pregnant with twins and has been having abd cramping that is constant for the past 4 hours  Denies spotting or bleeding  Pt was seen at [facility] on Saturday after her fall

## 2016-04-22 ENCOUNTER — Inpatient Hospital Stay (HOSPITAL_COMMUNITY): Payer: Medicaid Other

## 2016-04-22 ENCOUNTER — Ambulatory Visit (INDEPENDENT_AMBULATORY_CARE_PROVIDER_SITE_OTHER): Payer: Medicaid Other | Admitting: Obstetrics

## 2016-04-22 ENCOUNTER — Inpatient Hospital Stay (EMERGENCY_DEPARTMENT_HOSPITAL)
Admission: AD | Admit: 2016-04-22 | Discharge: 2016-04-22 | Disposition: A | Discharge status: Home / Self Care | Payer: Medicaid Other | Source: Ambulatory Visit | Attending: Family Medicine | Admitting: Family Medicine

## 2016-04-22 ENCOUNTER — Encounter: Payer: Self-pay | Admitting: Obstetrics

## 2016-04-22 ENCOUNTER — Encounter (HOSPITAL_COMMUNITY): Payer: Self-pay | Admitting: *Deleted

## 2016-04-22 VITALS — BP 132/93 | HR 112 | Wt 311.0 lb

## 2016-04-22 DIAGNOSIS — O4702 False labor before 37 completed weeks of gestation, second trimester: Secondary | ICD-10-CM

## 2016-04-22 DIAGNOSIS — O30032 Twin pregnancy, monochorionic/diamniotic, second trimester: Secondary | ICD-10-CM

## 2016-04-22 DIAGNOSIS — O9989 Other specified diseases and conditions complicating pregnancy, childbirth and the puerperium: Secondary | ICD-10-CM | POA: Diagnosis not present

## 2016-04-22 DIAGNOSIS — O26892 Other specified pregnancy related conditions, second trimester: Secondary | ICD-10-CM | POA: Diagnosis not present

## 2016-04-22 DIAGNOSIS — W19XXXD Unspecified fall, subsequent encounter: Secondary | ICD-10-CM | POA: Diagnosis not present

## 2016-04-22 DIAGNOSIS — R109 Unspecified abdominal pain: Secondary | ICD-10-CM

## 2016-04-22 DIAGNOSIS — O0992 Supervision of high risk pregnancy, unspecified, second trimester: Secondary | ICD-10-CM

## 2016-04-22 DIAGNOSIS — O4700 False labor before 37 completed weeks of gestation, unspecified trimester: Secondary | ICD-10-CM

## 2016-04-22 DIAGNOSIS — O30039 Twin pregnancy, monochorionic/diamniotic, unspecified trimester: Secondary | ICD-10-CM

## 2016-04-22 LAB — URINALYSIS, ROUTINE W REFLEX MICROSCOPIC
Bilirubin Urine: NEGATIVE
GLUCOSE, UA: NEGATIVE mg/dL
HGB URINE DIPSTICK: NEGATIVE
KETONES UR: NEGATIVE mg/dL
Nitrite: NEGATIVE
Protein, ur: NEGATIVE mg/dL
Specific Gravity, Urine: 1.025 (ref 1.005–1.030)
pH: 7 (ref 5.0–8.0)

## 2016-04-22 MED ORDER — BETAMETHASONE SOD PHOS & ACET 6 (3-3) MG/ML IJ SUSP
12.0000 mg | Freq: Once | INTRAMUSCULAR | Status: AC
  Administered 2016-04-22: 12 mg via INTRAMUSCULAR
  Filled 2016-04-22: qty 2

## 2016-04-22 MED ORDER — NIFEDIPINE 10 MG PO CAPS: 10.0000 mg | ORAL_CAPSULE | Freq: Four times a day (QID) | ORAL | 0 refills | Status: DC | PRN

## 2016-04-22 MED ORDER — LACTATED RINGERS IV BOLUS (SEPSIS)
1000.0000 mL | Freq: Once | INTRAVENOUS | Status: AC
  Administered 2016-04-22: 1000 mL via INTRAVENOUS

## 2016-04-22 MED ORDER — NIFEDIPINE 10 MG PO CAPS
10.0000 mg | ORAL_CAPSULE | ORAL | Status: DC | PRN
  Administered 2016-04-22 (×2): 10 mg via ORAL
  Filled 2016-04-22 (×2): qty 1

## 2016-04-22 NOTE — Progress Notes (Signed)
Subjective:  Jacqueline Clarke is a 31 y.o. L2G4010 at [redacted]w[redacted]d being seen today for ongoing prenatal care.  She is currently monitored for the following issues for this high-risk pregnancy and has Depression; Monochorionic diamniotic twin gestation; Supervision of high-risk pregnancy; Low vitamin D level; Anti-D antibodies present during pregnancy; Encounter for procreative genetic counseling; and Threatened premature labor, antepartum on her problem list.  Patient reports contractions since 04-20-16 and headache.  Fell at [place] on Saturday, and has been cramping since then .  Evaluated at [facility] on Saturday and yesterday for cramping and HA.  She was cleared and discharged home.  Vag. Bleeding: None.  Movement: Present. Denies leaking of fluid.   The following portions of the patient's history were reviewed and updated as appropriate: allergies, current medications, past family history, past medical history, past social history, past surgical history and problem list. Problem list updated.  Objective:   Vitals:   04/22/16 1507  BP: (!) 132/93  Pulse: (!) 112  Weight: (!) 311 lb (141.1 kg)    Fetal Status: Fetal Heart Rate (bpm): 150 / 140   Movement: Present     General:  Alert, oriented and cooperative. Patient is in no acute distress.  Skin: Skin is warm and dry. No rash noted.   Cardiovascular: Normal heart rate noted  Respiratory: Normal respiratory effort, no problems with respiration noted  Abdomen: Soft, gravid, appropriate for gestational age. Pain/Pressure: Present     Pelvic:  Cervical exam deferred        Extremities: Normal range of motion.  Edema: None  Mental Status: Normal mood and affect. Normal behavior. Normal judgment and thought content.   Urinalysis:      Assessment and Plan:  Pregnancy: U7O5366 at [redacted]w[redacted]d Twins Threatened PTL.  Sent to [facility] for evaluation.  There are no diagnoses linked to this encounter. Preterm labor symptoms and general obstetric  precautions including but not limited to vaginal bleeding, contractions, leaking of fluid and fetal movement were reviewed in detail with the patient. Please refer to After Visit Summary for other counseling recommendations.  Return in 2 weeks (on 05/06/2016).  2 hour Glucola next visit.   Brock Bad, MDPatient ID: [name], female   DOB: [birth year], 31 y.o.   MRN: [id number]

## 2016-04-22 NOTE — Discharge Instructions (Signed)
. °Preterm Labor and Birth Information °The normal length of a pregnancy is 39-41 weeks. Preterm labor is when labor starts before 37 completed weeks of pregnancy. °What are the risk factors for preterm labor? °Preterm labor is more likely to occur in women who: °· Have certain infections during pregnancy such as a bladder infection, sexually transmitted infection, or infection inside the uterus (chorioamnionitis). °· Have a shorter-than-normal cervix. °· Have gone into preterm labor before. °· Have had surgery on their cervix. °· Are younger than age 17 or older than age 35. °· Are African American. °· Are pregnant with twins or multiple babies (multiple gestation). °· Take street drugs or smoke while pregnant. °· Do not gain enough weight while pregnant. °· Became pregnant shortly after having been pregnant. °What are the symptoms of preterm labor? °Symptoms of preterm labor include: °· Cramps similar to those that can happen during a menstrual period. The cramps may happen with diarrhea. °· Pain in the abdomen or lower back. °· Regular uterine contractions that may feel like tightening of the abdomen. °· A feeling of increased pressure in the pelvis. °· Increased watery or bloody mucus discharge from the vagina. °· Water breaking (ruptured amniotic sac). °Why is it important to recognize signs of preterm labor? °It is important to recognize signs of preterm labor because babies who are born prematurely may not be fully developed. This can put them at an increased risk for: °· Long-term (chronic) heart and lung problems. °· Difficulty immediately after birth with regulating body systems, including blood sugar, body temperature, heart rate, and breathing rate. °· Bleeding in the brain. °· Cerebral palsy. °· Learning difficulties. °· Death. °These risks are highest for babies who are born before 34 weeks of pregnancy. °How is preterm labor treated? °Treatment depends on the length of your pregnancy, your condition,  and the health of your baby. It may involve: °· Having a stitch (suture) placed in your cervix to prevent your cervix from opening too early (cerclage). °· Taking or being given medicines, such as: °¨ Hormone medicines. These may be given early in pregnancy to help support the pregnancy. °¨ Medicine to stop contractions. °¨ Medicines to help mature the baby’s lungs. These may be prescribed if the risk of delivery is high. °¨ Medicines to prevent your baby from developing cerebral palsy. °If the labor happens before 34 weeks of pregnancy, you may need to stay in the hospital. °What should I do if I think I am in preterm labor? °If you think that you are going into preterm labor, call your health care provider right away. °How can I prevent preterm labor in future pregnancies? °To increase your chance of having a full-term pregnancy: °· Do not use any tobacco products, such as cigarettes, chewing tobacco, and e-cigarettes. If you need help quitting, ask your health care provider. °· Do not use street drugs or medicines that have not been prescribed to you during your pregnancy. °· Talk with your health care provider before taking any herbal supplements, even if you have been taking them regularly. °· Make sure you gain a healthy amount of weight during your pregnancy. °· Watch for infection. If you think that you might have an infection, get it checked right away. °· Make sure to tell your health care provider if you have gone into preterm labor before. °This information is not intended to replace advice given to you by your health care provider. Make sure you discuss any questions you have with your   health care provider. °Document Released: 03/29/2003 Document Revised: 06/19/2015 Document Reviewed: 05/30/2015 °Elsevier Interactive Patient Education © 2017 [employer] Inc. ° °

## 2016-04-22 NOTE — Progress Notes (Signed)
Patient went to the ER after she fell in [place] on 04/19/16. FHR  147/150. She has been cramping since

## 2016-04-22 NOTE — MAU Provider Note (Signed)
History     CSN: [id number]  Arrival date and time: 04/21/16 7829   First Provider Initiated Contact with Patient 04/22/16 0153      Chief Complaint  Patient presents with  . Abdominal Pain   Jacqueline Clarke is a 31 y.o. F6O1308 at M5H8469 who presents today with lower abdominal and hip pain. She had a fall on [place], and has been seen several times since this fall. She states that she was at work today and started having hip pain. She had to leave work. She states that her pain has resolved now. She denies any VB or LOF. She denies any contractions. She reports normal fetal movement.   Abdominal Pain  This is a new problem. The current episode started today. The onset quality is gradual. The problem occurs intermittently. The problem has been resolved. The pain is located in the suprapubic region. The pain is at a severity of 0/10. The quality of the pain is dull. The abdominal pain radiates to the back (hips ). Pertinent negatives include no dysuria, fever, nausea or vomiting. Nothing aggravates the pain. The pain is relieved by nothing. She has tried nothing for the symptoms.   Past Medical History:  Diagnosis Date  . Anemia   . Anxiety   . Asthma   . Blood type, Rh negative   . Cholelithiasis   . Chronic back pain    scoliosis- on percocet  . Depression   . Headache(784.0)   . Hyperemesis complicating pregnancy, antepartum 2013  . 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 Obesity   . Pregnancy induced hypertension   . Scoliosis     Past Surgical History:  Procedure Laterality Date  . ADENOIDECTOMY    . ADENOIDECTOMY    . arm surgery    . CHOLECYSTECTOMY    . DILATION AND CURETTAGE OF UTERUS  2008  . MR WRIST RIGHT     sx to nerves  . NERVE, TENDON AND ARTERY REPAIR Right 06/18/2014   Procedure: NERVE, TENDON AND ARTERY REPAIR;  Surgeon: Bradly Bienenstock, MD;  Location: [facility] OR;  Service: Orthopedics;  Laterality: Right;  . TONSILLECTOMY AND ADENOIDECTOMY       Family History  Problem Relation Age of Onset  . Heart disease Maternal Grandfather   . Diabetes Maternal Grandfather   . Heart disease Father   . Anesthesia problems Neg Hx     Social History  Substance Use Topics  . Smoking status: Former Smoker    Packs/day: 0.50    Types: Cigarettes  . Smokeless tobacco: Never Used  . Alcohol use No     Comment: Occassionally    Allergies: No Known Allergies  Prescriptions Prior to Admission  Medication Sig Dispense Refill Last Dose  . aspirin EC 81 MG tablet Take 1 tablet (81 mg total) by mouth daily. Take after 12 weeks for prevention of preeclampsia later in pregnancy 300 tablet 2 04/21/2016 at Unknown time  . cyclobenzaprine (FLEXERIL) 10 MG tablet Take 1 tablet by mouth 3 (three) times daily as needed for muscle spasms.   3 04/21/2016 at Unknown time  . Doxylamine-Pyridoxine (DICLEGIS) 10-10 MG TBEC Take 1 tablet with breakfast and lunch.  Take 2 tablets at bedtime. 100 tablet 4 04/21/2016 at Unknown time  . traMADol (ULTRAM) 50 MG tablet Take 1 tablet (50 mg total) by mouth every 6 (six) hours as needed. 6 tablet 0 04/21/2016 at Unknown time  . Prenat-FeAsp-Meth-FA-DHA w/o A (PRENATE PIXIE)  10-0.6-0.4-200 MG CAPS Take 1 tablet by mouth daily. (Patient not taking: Reported on 03/27/2016) 30 capsule 12 Not Taking  . Vitamin D, Ergocalciferol, (DRISDOL) 50000 units CAPS capsule Take 1 capsule (50,000 Units total) by mouth every 7 (seven) days. (Patient not taking: Reported on 02/26/2016) 30 capsule 2 Not Taking    Review of Systems  Constitutional: Negative for chills and fever.  Gastrointestinal: Positive for abdominal pain. Negative for nausea and vomiting.  Genitourinary: Negative for dysuria, urgency, vaginal bleeding and vaginal discharge.   Physical Exam   Blood pressure 117/81, pulse 97, temperature 98.6 F (37 C), resp. rate (!) 24, height  (1.803 m), weight (!) 311 lb 2 oz (141.1 kg), last menstrual period 10/20/2015, SpO2 100 %,  currently breastfeeding.  Physical Exam  Nursing note and vitals reviewed. Constitutional: She is oriented to person, place, and time. She appears well-developed and well-nourished. No distress.  HENT:  Head: Normocephalic.  Cardiovascular: Normal rate.   Respiratory: Effort normal.  GI: Soft. There is no tenderness. There is no rebound.  Genitourinary:  Genitourinary Comments: Cervix: FT/THICK/ballotable/VTX   Neurological: She is alert and oriented to person, place, and time.  Skin: Skin is warm and dry.  Psychiatric: She has a normal mood and affect.  FHT: baby a: 150, moderate with 10x10 accels, no decels FHT: baby b: 145, moderate with 10x10 accesl, no decels  Toco: no UCs    Results for orders placed or performed during the hospital encounter of 04/21/16 (from the past 24 hour(s))  Urinalysis, Routine w reflex microscopic     Status: Abnormal   Collection Time: 04/21/16  9:31 PM  Result Value Ref Range   Color, Urine YELLOW YELLOW   APPearance HAZY (A) CLEAR   Specific Gravity, Urine 1.024 1.005 - 1.030   pH 6.0 5.0 - 8.0   Glucose, UA NEGATIVE NEGATIVE mg/dL   Hgb urine dipstick NEGATIVE NEGATIVE   Bilirubin Urine NEGATIVE NEGATIVE   Ketones, ur NEGATIVE NEGATIVE mg/dL   Protein, ur NEGATIVE NEGATIVE mg/dL   Nitrite NEGATIVE NEGATIVE   Leukocytes, UA LARGE (A) NEGATIVE   RBC / HPF 6-30 0 - 5 RBC/hpf   WBC, UA TOO NUMEROUS TO COUNT 0 - 5 WBC/hpf   Bacteria, UA RARE (A) NONE SEEN   Squamous Epithelial / LPF 6-30 (A) NONE SEEN   Mucous PRESENT   CBC with Differential/Platelet     Status: Abnormal   Collection Time: 04/21/16  9:38 PM  Result Value Ref Range   WBC 12.0 (H) 4.0 - 10.5 K/uL   RBC 3.87 3.87 - 5.11 MIL/uL   Hemoglobin 10.2 (L) 12.0 - 15.0 g/dL   HCT 16.1 (L) 09.6 - 04.5 %   MCV 82.9 78.0 - 100.0 fL   MCH 26.4 26.0 - 34.0 pg   MCHC 31.8 30.0 - 36.0 g/dL   RDW 40.9 81.1 - 91.4 %   Platelets 231 150 - 400 K/uL   Neutrophils Relative % 76 %   Neutro Abs  9.0 (H) 1.7 - 7.7 K/uL   Lymphocytes Relative 17 %   Lymphs Abs 2.0 0.7 - 4.0 K/uL   Monocytes Relative 6 %   Monocytes Absolute 0.8 0.1 - 1.0 K/uL   Eosinophils Relative 1 %   Eosinophils Absolute 0.2 0.0 - 0.7 K/uL   Basophils Relative 0 %   Basophils Absolute 0.0 0.0 - 0.1 K/uL  Comprehensive metabolic panel     Status: Abnormal   Collection Time: 04/21/16  9:38 PM  Result Value Ref Range   Sodium 138 135 - 145 mmol/L   Potassium 3.4 (L) 3.5 - 5.1 mmol/L   Chloride 106 101 - 111 mmol/L   CO2 26 22 - 32 mmol/L   Glucose, Bld 81 65 - 99 mg/dL   BUN 8 6 - 20 mg/dL   Creatinine, Ser 1.61 0.44 - 1.00 mg/dL   Calcium 8.8 (L) 8.9 - 10.3 mg/dL   Total Protein 8.1 6.5 - 8.1 g/dL   Albumin 2.9 (L) 3.5 - 5.0 g/dL   AST 31 15 - 41 U/L   ALT 20 14 - 54 U/L   Alkaline Phosphatase 108 38 - 126 U/L   Total Bilirubin 0.3 0.3 - 1.2 mg/dL   GFR calc non Af Amer >60 >60 mL/min   GFR calc Af Amer >60 >60 mL/min   Anion gap 6 5 - 15    MAU Course  Procedures  MDM   Assessment and Plan   1. Fall, subsequent encounter   2. [redacted] weeks gestation of pregnancy    DC home Comfort measures reviewed  2nd/3rd Trimester precautions  PTL precautions  Fetal kick counts RX: none  Return to MAU as needed FU with OB as planned  Follow-up Information    HARPER,CHARLES A, MD Follow up.   Specialty:  Obstetrics and Gynecology Contact information: [address] [place] [address] [phone]            Tawnya Crook 04/22/2016, 2:12 AM

## 2016-04-22 NOTE — MAU Note (Signed)
Sent from office, contracting. No bleeding or leaking. Was on the monitor. Ctx's are q 3-4 min.

## 2016-04-22 NOTE — Discharge Instructions (Signed)
Preterm Labor and Birth Information °Pregnancy normally lasts 39-41 weeks. Preterm labor is when labor starts early. It starts before you have been pregnant for 37 whole weeks. °What are the risk factors for preterm labor? °Preterm labor is more likely to occur in women who: °· Have an infection while pregnant. °· Have a cervix that is short. °· Have gone into preterm labor before. °· Have had surgery on their cervix. °· Are younger than age 30. °· Are older than age 35. °· Are African American. °· Are pregnant with two or more babies. °· Take street drugs while pregnant. °· Smoke while pregnant. °· Do not gain enough weight while pregnant. °· Got pregnant right after another pregnancy. °What are the symptoms of preterm labor? °Symptoms of preterm labor include: °· Cramps. The cramps may feel like the cramps some women get during their period. The cramps may happen with watery poop (diarrhea). °· Pain in the belly (abdomen). °· Pain in the lower back. °· Regular contractions or tightening. It may feel like your belly is getting tighter. °· Pressure in the lower belly that seems to get stronger. °· More fluid (discharge) leaking from the vagina. The fluid may be watery or bloody. °· Water breaking. °Why is it important to notice signs of preterm labor? °Babies who are born early may not be fully developed. They have a higher chance for: °· Long-term heart problems. °· Long-term lung problems. °· Trouble controlling body systems, like breathing. °· Bleeding in the brain. °· A condition called cerebral palsy. °· Learning difficulties. °· Death. °These risks are highest for babies who are born before 34 weeks of pregnancy. °How is preterm labor treated? °Treatment depends on: °· How long you were pregnant. °· Your condition. °· The health of your baby. °Treatment may involve: °· Having a stitch (suture) placed in your cervix. When you give birth, your cervix opens so the baby can come out. The stitch keeps the cervix  from opening too soon. °· Staying at the hospital. °· Taking or getting medicines, such as: °¨ Hormone medicines. °¨ Medicines to stop contractions. °¨ Medicines to help the baby’s lungs develop. °¨ Medicines to prevent your baby from having cerebral palsy. °What should I do if I am in preterm labor? °If you think you are going into labor too soon, call your doctor right away. °How can I prevent preterm labor? °· Do not use any tobacco products. °¨ Examples of these are cigarettes, chewing tobacco, and e-cigarettes. °¨ If you need help quitting, ask your doctor. °· Do not use street drugs. °· Do not use any medicines unless you ask your doctor if they are safe for you. °· Talk with your doctor before taking any herbal supplements. °· Make sure you gain enough weight. °· Watch for infection. If you think you might have an infection, get it checked right away. °· If you have gone into preterm labor before, tell your doctor. °This information is not intended to replace advice given to you by your health care provider. Make sure you discuss any questions you have with your health care provider. °Document Released: 04/04/2008 Document Revised: 06/19/2015 Document Reviewed: 05/30/2015 °Elsevier Interactive Patient Education © 2017 [employer] ° °

## 2016-04-22 NOTE — MAU Provider Note (Signed)
Chief Complaint:  contracting   None     HPI: Jacqueline Clarke is a 31 y.o. [id number] at 60w3dwho presents to maternity admissions sent from [facility] [place] office for cramping/contractions on NST. Pt reports pelvic abdominal pain is similar to pain she has had since her fall 3 days ago, maybe a little bit worse today.  She has not tried any treatments, reports she is drinking enough water.  The pain is intermittent, in her low abdomen, is worse with walking/movement, and radiates to her low back.  She has a h/a as her only associated symptom today as well.  The h/a is constant, frontal, and unchanged since onset this morning.  She presented to MAU and to [facility] 3 different times for abdominal pain, once before her fall 3 days ago and twice since the fall.  She reports good fetal movement, denies LOF, vaginal bleeding, vaginal itching/burning, urinary symptoms, dizziness, n/v, or fever/chills.    HPI  Past Medical History: Past Medical History:  Diagnosis Date  . Anemia   . Anxiety   . Asthma   . Blood type, Rh negative   . Cholelithiasis   . Chronic back pain    scoliosis- on percocet  . Depression   . Headache(784.0)   . Hyperemesis complicating pregnancy, antepartum 2013  . 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 Obesity   . Pregnancy induced hypertension   . Scoliosis     Past obstetric history: OB History  Gravida Para Term Preterm AB Living  0 2 4  SAB TAB Ectopic Multiple Live Births  2 0 0   4    # Outcome Date GA Lbr Len/2nd Weight Sex Delivery Anes PTL Lv  7 Current           6 Term 06/22/15 [redacted]w[redacted]d 07:35 / 00:18 9 lb 1.7 oz (4.13 kg) F Vag-Spont EPI  LIV  5 Term 07/22/11 [redacted]w[redacted]d 03:24 / 00:15 8 lb 15.4 oz (4.065 kg) F Vag-Spont EPI  LIV     Birth Comments: WNL  4 Term 2011 [redacted]w[redacted]d 04:00 6 lb 14 oz (3.118 kg) M Vag-Spont EPI  LIV  3 Term 2010 [redacted]w[redacted]d  7 lb 6 oz (3.345 kg) M Vag-Spont EPI  LIV  2 SAB 2009          1 SAB 2006              Past Surgical  History: Past Surgical History:  Procedure Laterality Date  . ADENOIDECTOMY    . ADENOIDECTOMY    . arm surgery    . CHOLECYSTECTOMY    . DILATION AND CURETTAGE OF UTERUS  2008  . MR WRIST RIGHT     sx to nerves  . NERVE, TENDON AND ARTERY REPAIR Right 06/18/2014   Procedure: NERVE, TENDON AND ARTERY REPAIR;  Surgeon: Bradly Bienenstock, MD;  Location: [facility] OR;  Service: Orthopedics;  Laterality: Right;  . TONSILLECTOMY AND ADENOIDECTOMY      Family History: Family History  Problem Relation Age of Onset  . Heart disease Maternal Grandfather   . Diabetes Maternal Grandfather   . Heart disease Father   . Anesthesia problems Neg Hx     Social History: Social History  Substance Use Topics  . Smoking status: Former Smoker    Packs/day: 0.50    Types: Cigarettes  . Smokeless tobacco: Never Used  . Alcohol use No     Comment: Occassionally    Allergies: No  Known Allergies  Meds:  Prescriptions Prior to Admission  Medication Sig Dispense Refill Last Dose  . acetaminophen (TYLENOL) 325 MG tablet Take 650 mg by mouth every 6 (six) hours as needed for moderate pain.   04/22/2016 at Unknown time  . aspirin EC 81 MG tablet Take 1 tablet (81 mg total) by mouth daily. Take after 12 weeks for prevention of preeclampsia later in pregnancy 300 tablet 2 04/22/2016 at Unknown time  . cyclobenzaprine (FLEXERIL) 10 MG tablet Take 1 tablet by mouth 3 (three) times daily as needed for muscle spasms.   3 04/22/2016 at Unknown time  . Doxylamine-Pyridoxine (DICLEGIS) 10-10 MG TBEC Take 1 tablet with breakfast and lunch.  Take 2 tablets at bedtime. 100 tablet 4 Past Week at Unknown time  . traMADol (ULTRAM) 50 MG tablet Take 1 tablet (50 mg total) by mouth every 6 (six) hours as needed. (Patient taking differently: Take 50 mg by mouth every 6 (six) hours as needed for moderate pain. ) 6 tablet 0 04/22/2016 at Unknown time    ROS:  Review of Systems  Constitutional: Negative for chills, fatigue and fever.   Eyes: Negative for visual disturbance.  Respiratory: Negative for shortness of breath.   Cardiovascular: Negative for chest pain.  Gastrointestinal: Positive for abdominal pain. Negative for nausea and vomiting.  Genitourinary: Positive for pelvic pain. Negative for difficulty urinating, dysuria, flank pain, vaginal bleeding, vaginal discharge and vaginal pain.  Musculoskeletal: Positive for back pain.  Neurological: Positive for headaches. Negative for dizziness.  Psychiatric/Behavioral: Negative.      I have reviewed patient's Past Medical Hx, Surgical Hx, Family Hx, Social Hx, medications and allergies.   Physical Exam  Patient Vitals for the past 24 hrs:  BP Temp Temp src Pulse Resp SpO2  04/22/16 1734 122/76 98.7 F (37.1 C) Oral 99 16 100 %   Constitutional: Well-developed, well-nourished female in no acute distress.  Cardiovascular: normal rate Respiratory: normal effort GI: Abd soft, non-tender, gravid appropriate for gestational age.  MS: Extremities nontender, no edema, normal ROM Neurologic: Alert and oriented x 4.  GU: Neg CVAT.   Dilation: 1 Effacement (%): Thick Exam by:: Sharen Counter  FHT:  Baby A: Baseline 150 , moderate variability, accelerations present, no decelerations Baby B: Baseline 150 , moderate variability, accelerations present, no decelerations  Contractions: q 4-5 mins lasting 40 sec   Labs: Results for orders placed or performed during the hospital encounter of 04/21/16 (from the past 24 hour(s))  Urinalysis, Routine w reflex microscopic     Status: Abnormal   Collection Time: 04/21/16  9:31 PM  Result Value Ref Range   Color, Urine YELLOW YELLOW   APPearance HAZY (A) CLEAR   Specific Gravity, Urine 1.024 1.005 - 1.030   pH 6.0 5.0 - 8.0   Glucose, UA NEGATIVE NEGATIVE mg/dL   Hgb urine dipstick NEGATIVE NEGATIVE   Bilirubin Urine NEGATIVE NEGATIVE   Ketones, ur NEGATIVE NEGATIVE mg/dL   Protein, ur NEGATIVE NEGATIVE mg/dL    Nitrite NEGATIVE NEGATIVE   Leukocytes, UA LARGE (A) NEGATIVE   RBC / HPF 6-30 0 - 5 RBC/hpf   WBC, UA TOO NUMEROUS TO COUNT 0 - 5 WBC/hpf   Bacteria, UA RARE (A) NONE SEEN   Squamous Epithelial / LPF 6-30 (A) NONE SEEN   Mucous PRESENT   CBC with Differential/Platelet     Status: Abnormal   Collection Time: 04/21/16  9:38 PM  Result Value Ref Range   WBC 12.0 (H) 4.0 -  10.5 K/uL   RBC 3.87 3.87 - 5.11 MIL/uL   Hemoglobin 10.2 (L) 12.0 - 15.0 g/dL   HCT 57.8 (L) 46.9 - 62.9 %   MCV 82.9 78.0 - 100.0 fL   MCH 26.4 26.0 - 34.0 pg   MCHC 31.8 30.0 - 36.0 g/dL   RDW 52.8 41.3 - 24.4 %   Platelets 231 150 - 400 K/uL   Neutrophils Relative % 76 %   Neutro Abs 9.0 (H) 1.7 - 7.7 K/uL   Lymphocytes Relative 17 %   Lymphs Abs 2.0 0.7 - 4.0 K/uL   Monocytes Relative 6 %   Monocytes Absolute 0.8 0.1 - 1.0 K/uL   Eosinophils Relative 1 %   Eosinophils Absolute 0.2 0.0 - 0.7 K/uL   Basophils Relative 0 %   Basophils Absolute 0.0 0.0 - 0.1 K/uL  Comprehensive metabolic panel     Status: Abnormal   Collection Time: 04/21/16  9:38 PM  Result Value Ref Range   Sodium 138 135 - 145 mmol/L   Potassium 3.4 (L) 3.5 - 5.1 mmol/L   Chloride 106 101 - 111 mmol/L   CO2 26 22 - 32 mmol/L   Glucose, Bld 81 65 - 99 mg/dL   BUN 8 6 - 20 mg/dL   Creatinine, Ser 0.10 0.44 - 1.00 mg/dL   Calcium 8.8 (L) 8.9 - 10.3 mg/dL   Total Protein 8.1 6.5 - 8.1 g/dL   Albumin 2.9 (L) 3.5 - 5.0 g/dL   AST 31 15 - 41 U/L   ALT 20 14 - 54 U/L   Alkaline Phosphatase 108 38 - 126 U/L   Total Bilirubin 0.3 0.3 - 1.2 mg/dL   GFR calc non Af Amer >60 >60 mL/min   GFR calc Af Amer >60 >60 mL/min   Anion gap 6 5 - 15   O/Negative/-- (12/06 1552)  Imaging:   MAU Course/MDM: I have ordered labs and reviewed results.  NST reviewed [place] for cervical length indicates cervix 3.6 cm in length Consult Dr Adrian Blackwater with presentation, exam findings and test results.  Procardia 10 mg Q 20 min x 3 PRN and LR bolus Report to  [place], CNM.  Betamethasone No. 1 given. UC's resolved. Cervix unchanged. Pt feels ready for D/C.  MDM - Preterm contractions without evidence of active preterm labor. Betamethasone given as precaution for increased risk of preterm delivery (twin gestation, close pregnancy spacing, preterm contractions)  Assessment 1. Preterm uterine contractions in second trimester, antepartum   2. Supervision of high risk pregnancy in second trimester   3. Monochorionic diamniotic twin gestation in second trimester   4. Abdominal pain during pregnancy in second trimester    Plan: Discharge home in stable condition. Preterm labor precautions. Rx Procardia Follow-up Information    [facility] [place] [facility] Follow up on 04/22/2016.   Why:  tomorrow night for second steriod shot or sooner as needed if symptoms worsen.  Contact information: [address] [id number] mc [place] [address] [phone]       [facility] Follow up in 2 week(s).   Specialty:  Obstetrics and Gynecology Why:  as scheduled Contact information: [address] [place] [address] [phone]         Allergies as of 04/22/2016   No Known Allergies     Medication List    TAKE these medications   acetaminophen 325 MG tablet Commonly known as:  TYLENOL Take 650 mg by  mouth every 6 (six) hours as needed for moderate pain.   aspirin EC 81 MG tablet Take 1 tablet (81 mg total) by mouth daily. Take after 12 weeks for prevention of preeclampsia later in pregnancy   cyclobenzaprine 10 MG tablet Commonly known as:  FLEXERIL Take 1 tablet by mouth 3 (three) times daily as needed for muscle spasms.   Doxylamine-Pyridoxine 10-10 MG Tbec Commonly known as:  DICLEGIS Take 1 tablet with breakfast and lunch.  Take 2 tablets at bedtime.   NIFEdipine 10 MG capsule Commonly known as:  PROCARDIA Take 1 capsule (10 mg total)  by mouth every 6 (six) hours as needed (contractions). For more than 5 contractions per hour   traMADol 50 MG tablet Commonly known as:  ULTRAM Take 1 tablet (50 mg total) by mouth every 6 (six) hours as needed. What changed:  reasons to take this       Sharen Counter Certified Nurse-Midwife 04/22/2016 7:14 PM   Dorathy Kinsman, CNM 04/23/2016 5:01 AM

## 2016-04-23 ENCOUNTER — Inpatient Hospital Stay (HOSPITAL_COMMUNITY): Payer: Medicaid Other

## 2016-04-23 ENCOUNTER — Encounter (HOSPITAL_COMMUNITY): Payer: Self-pay

## 2016-04-23 ENCOUNTER — Inpatient Hospital Stay (HOSPITAL_COMMUNITY)
Admission: AD | Admit: 2016-04-23 | Discharge: 2016-04-26 | DRG: 781 | Disposition: A | Discharge status: Home / Self Care | Payer: Medicaid Other | Source: Ambulatory Visit | Attending: Obstetrics and Gynecology | Admitting: Obstetrics and Gynecology

## 2016-04-23 DIAGNOSIS — Z3A26 26 weeks gestation of pregnancy: Secondary | ICD-10-CM

## 2016-04-23 DIAGNOSIS — Z87891 Personal history of nicotine dependence: Secondary | ICD-10-CM

## 2016-04-23 DIAGNOSIS — Z8249 Family history of ischemic heart disease and other diseases of the circulatory system: Secondary | ICD-10-CM

## 2016-04-23 DIAGNOSIS — G44309 Post-traumatic headache, unspecified, not intractable: Secondary | ICD-10-CM | POA: Diagnosis present

## 2016-04-23 DIAGNOSIS — O09892 Supervision of other high risk pregnancies, second trimester: Secondary | ICD-10-CM

## 2016-04-23 DIAGNOSIS — O30032 Twin pregnancy, monochorionic/diamniotic, second trimester: Secondary | ICD-10-CM | POA: Diagnosis present

## 2016-04-23 DIAGNOSIS — R519 Headache, unspecified: Secondary | ICD-10-CM

## 2016-04-23 DIAGNOSIS — Z8614 Personal history of Methicillin resistant Staphylococcus aureus infection: Secondary | ICD-10-CM

## 2016-04-23 DIAGNOSIS — R51 Headache: Secondary | ICD-10-CM

## 2016-04-23 DIAGNOSIS — O99212 Obesity complicating pregnancy, second trimester: Secondary | ICD-10-CM

## 2016-04-23 DIAGNOSIS — M419 Scoliosis, unspecified: Secondary | ICD-10-CM | POA: Diagnosis present

## 2016-04-23 DIAGNOSIS — W19XXXA Unspecified fall, initial encounter: Secondary | ICD-10-CM | POA: Diagnosis present

## 2016-04-23 DIAGNOSIS — O26892 Other specified pregnancy related conditions, second trimester: Principal | ICD-10-CM | POA: Diagnosis present

## 2016-04-23 DIAGNOSIS — Z833 Family history of diabetes mellitus: Secondary | ICD-10-CM

## 2016-04-23 DIAGNOSIS — Z6791 Unspecified blood type, Rh negative: Secondary | ICD-10-CM

## 2016-04-23 LAB — URINALYSIS, ROUTINE W REFLEX MICROSCOPIC
BILIRUBIN URINE: NEGATIVE
Glucose, UA: NEGATIVE mg/dL
Hgb urine dipstick: NEGATIVE
Ketones, ur: NEGATIVE mg/dL
Nitrite: NEGATIVE
PH: 6 (ref 5.0–8.0)
Protein, ur: NEGATIVE mg/dL
SPECIFIC GRAVITY, URINE: 1.019 (ref 1.005–1.030)

## 2016-04-23 LAB — URINE CULTURE

## 2016-04-23 MED ORDER — NALOXONE HCL 0.4 MG/ML IJ SOLN: 0.4000 mg | INTRAMUSCULAR | Status: DC | PRN

## 2016-04-23 MED ORDER — METOCLOPRAMIDE HCL 5 MG/ML IJ SOLN
10.0000 mg | Freq: Once | INTRAMUSCULAR | Status: AC
  Administered 2016-04-23: 10 mg via INTRAVENOUS
  Filled 2016-04-23: qty 2

## 2016-04-23 MED ORDER — PRENATAL MULTIVITAMIN CH
1.0000 | ORAL_TABLET | Freq: Every day | ORAL | Status: DC
  Administered 2016-04-24 – 2016-04-25 (×2): 1 via ORAL
  Filled 2016-04-23 (×3): qty 1

## 2016-04-23 MED ORDER — ACETAMINOPHEN 325 MG PO TABS: 650.0000 mg | ORAL_TABLET | ORAL | Status: DC | PRN

## 2016-04-23 MED ORDER — HYDROMORPHONE HCL 1 MG/ML IJ SOLN
1.0000 mg | Freq: Once | INTRAMUSCULAR | Status: AC
  Administered 2016-04-23: 1 mg via INTRAVENOUS
  Filled 2016-04-23: qty 1

## 2016-04-23 MED ORDER — DIPHENHYDRAMINE HCL 50 MG/ML IJ SOLN
50.0000 mg | Freq: Once | INTRAMUSCULAR | Status: AC
  Administered 2016-04-23: 50 mg via INTRAVENOUS
  Filled 2016-04-23: qty 1

## 2016-04-23 MED ORDER — HYDROMORPHONE 1 MG/ML IV SOLN
INTRAVENOUS | Status: DC
  Administered 2016-04-24: 1.8 mg via INTRAVENOUS
  Administered 2016-04-24: 0.5 mg via INTRAVENOUS
  Administered 2016-04-24: 4.4 mg via INTRAVENOUS
  Filled 2016-04-23: qty 25

## 2016-04-23 MED ORDER — CALCIUM CARBONATE ANTACID 500 MG PO CHEW
2.0000 | CHEWABLE_TABLET | ORAL | Status: DC | PRN
  Administered 2016-04-24: 400 mg via ORAL
  Filled 2016-04-23: qty 2

## 2016-04-23 MED ORDER — ONDANSETRON HCL 4 MG/2ML IJ SOLN
4.0000 mg | Freq: Once | INTRAMUSCULAR | Status: AC
  Administered 2016-04-23: 4 mg via INTRAVENOUS
  Filled 2016-04-23: qty 2

## 2016-04-23 MED ORDER — ONDANSETRON HCL 4 MG/2ML IJ SOLN: 4.0000 mg | Freq: Four times a day (QID) | INTRAMUSCULAR | Status: DC | PRN

## 2016-04-23 MED ORDER — DIPHENHYDRAMINE HCL 50 MG/ML IJ SOLN: 12.5000 mg | Freq: Four times a day (QID) | INTRAMUSCULAR | Status: DC | PRN

## 2016-04-23 MED ORDER — SODIUM CHLORIDE 0.9 % IV SOLN
INTRAVENOUS | Status: DC
  Administered 2016-04-24 (×2): via INTRAVENOUS

## 2016-04-23 MED ORDER — DIPHENHYDRAMINE HCL 12.5 MG/5ML PO ELIX: 12.5000 mg | ORAL_SOLUTION | Freq: Four times a day (QID) | ORAL | Status: DC | PRN

## 2016-04-23 MED ORDER — DEXAMETHASONE SODIUM PHOSPHATE 10 MG/ML IJ SOLN
10.0000 mg | Freq: Once | INTRAMUSCULAR | Status: AC
  Administered 2016-04-23: 10 mg via INTRAVENOUS
  Filled 2016-04-23: qty 1

## 2016-04-23 MED ORDER — LACTATED RINGERS IV SOLN
INTRAVENOUS | Status: DC
  Administered 2016-04-23: 21:00:00 via INTRAVENOUS

## 2016-04-23 MED ORDER — ZOLPIDEM TARTRATE 5 MG PO TABS: 5.0000 mg | ORAL_TABLET | Freq: Every evening | ORAL | Status: DC | PRN

## 2016-04-23 MED ORDER — SODIUM CHLORIDE 0.9% FLUSH: 9.0000 mL | INTRAVENOUS | Status: DC | PRN

## 2016-04-23 MED ORDER — PROMETHAZINE HCL 25 MG/ML IJ SOLN: 6.2500 mg | INTRAMUSCULAR | Status: DC | PRN

## 2016-04-23 MED ORDER — DOCUSATE SODIUM 100 MG PO CAPS
100.0000 mg | ORAL_CAPSULE | Freq: Every day | ORAL | Status: DC
  Administered 2016-04-24 – 2016-04-26 (×2): 100 mg via ORAL
  Filled 2016-04-23 (×4): qty 1

## 2016-04-23 NOTE — MAU Note (Signed)
Pt states she fell Saturday and hit her head. Head has been hurting every since, but this morning around 8am, she states HA was much worse and her nose "was pouring out blood." States she has taken everything that she was prescribed for HA, but nothing is helping. Pt denies vag bleeding or LOF. Denies contractions. Just pressure at times. +FM, although decreased today.

## 2016-04-23 NOTE — MAU Note (Signed)
Return call from radiology, notified of CT scan order.

## 2016-04-23 NOTE — MAU Provider Note (Signed)
Chief Complaint:  Headache and Blurred Vision   First Provider Initiated Contact with Patient 04/23/16 1946     HPI: Jacqueline Clarke is a 31 y.o. [id number] at 64w4dwho presents to maternity admissions reporting severe, worsening headache, frontal and all over  [place] on 3/31 and hit her head,   Has had H/A since but much worse today.  Has never been imaged for that.  No speech or motor difficulties but "feels wobbly" when walking. She reports good fetal movement, denies LOF, vaginal bleeding, vaginal itching/burning, urinary symptoms, dizziness, n/v, diarrhea, constipation or fever/chills.    Headache   This is a recurrent problem. The current episode started in the past 7 days. The problem occurs constantly. The problem has been rapidly worsening. The pain is located in the bilateral, frontal and occipital region. The pain does not radiate. The pain quality is not similar to prior headaches. The quality of the pain is described as aching, band-like, squeezing and throbbing. The pain is at a severity of 10/10. The pain is severe. Associated symptoms include a loss of balance, phonophobia, photophobia and scalp tenderness. Pertinent negatives include no abdominal pain, abnormal behavior, anorexia, back pain, fever, muscle aches, nausea, neck pain, numbness, sinus pressure, tingling or vomiting. The symptoms are aggravated by activity, bright light and noise. She has tried acetaminophen, NSAIDs and oral narcotics for the symptoms. The treatment provided no relief.    RN Note: Pt states she fell Saturday and hit her head. Head has been hurting every since, but this morning around 8am, she states HA was much worse and her nose "was pouring out blood." States she has taken everything that she was prescribed for HA, but nothing is helping. Pt denies vag bleeding or LOF. Denies contractions. Just pressure at times. +FM, although decreased today.   Past Medical History: Past Medical History:  Diagnosis  Date  . Anemia   . Anxiety   . Asthma   . Blood type, Rh negative   . Cholelithiasis   . Chronic back pain    scoliosis- on percocet  . Depression   . Headache(784.0)   . Hyperemesis complicating pregnancy, antepartum 2013  . 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 Obesity   . Pregnancy induced hypertension   . Scoliosis     Past obstetric history: OB History  Gravida Para Term Preterm AB Living  0 2 4  SAB TAB Ectopic Multiple Live Births  2 0 0   4    # Outcome Date GA Lbr Len/2nd Weight Sex Delivery Anes PTL Lv  7 Current           6 Term 06/22/15 [redacted]w[redacted]d 07:35 / 00:18 9 lb 1.7 oz (4.13 kg) F Vag-Spont EPI  LIV  5 Term 07/22/11 [redacted]w[redacted]d 03:24 / 00:15 8 lb 15.4 oz (4.065 kg) F Vag-Spont EPI  LIV     Birth Comments: WNL  4 Term 2011 [redacted]w[redacted]d 04:00 6 lb 14 oz (3.118 kg) M Vag-Spont EPI  LIV  3 Term 2010 [redacted]w[redacted]d  7 lb 6 oz (3.345 kg) M Vag-Spont EPI  LIV  2 SAB 2009          1 SAB 2006              Past Surgical History: Past Surgical History:  Procedure Laterality Date  . ADENOIDECTOMY    . ADENOIDECTOMY    . arm surgery    . CHOLECYSTECTOMY    .  DILATION AND CURETTAGE OF UTERUS  2008  . MR WRIST RIGHT     sx to nerves  . NERVE, TENDON AND ARTERY REPAIR Right 06/18/2014   Procedure: NERVE, TENDON AND ARTERY REPAIR;  Surgeon: Bradly Bienenstock, MD;  Location: [facility] OR;  Service: Orthopedics;  Laterality: Right;  . TONSILLECTOMY AND ADENOIDECTOMY      Family History: Family History  Problem Relation Age of Onset  . Heart disease Maternal Grandfather   . Diabetes Maternal Grandfather   . Heart disease Father   . Anesthesia problems Neg Hx     Social History: Social History  Substance Use Topics  . Smoking status: Former Smoker    Packs/day: 0.50    Types: Cigarettes  . Smokeless tobacco: Never Used  . Alcohol use No     Comment: Occassionally    Allergies: No Known Allergies  Meds:  Prescriptions Prior to Admission  Medication Sig Dispense Refill  Last Dose  . acetaminophen (TYLENOL) 325 MG tablet Take 650 mg by mouth every 6 (six) hours as needed for moderate pain.   04/22/2016 at Unknown time  . aspirin EC 81 MG tablet Take 1 tablet (81 mg total) by mouth daily. Take after 12 weeks for prevention of preeclampsia later in pregnancy 300 tablet 2 04/22/2016 at Unknown time  . cyclobenzaprine (FLEXERIL) 10 MG tablet Take 1 tablet by mouth 3 (three) times daily as needed for muscle spasms.   3 04/22/2016 at Unknown time  . Doxylamine-Pyridoxine (DICLEGIS) 10-10 MG TBEC Take 1 tablet with breakfast and lunch.  Take 2 tablets at bedtime. 100 tablet 4 Past Week at Unknown time  . NIFEdipine (PROCARDIA) 10 MG capsule Take 1 capsule (10 mg total) by mouth every 6 (six) hours as needed (contractions). For more than 5 contractions per hour 30 capsule 0   . traMADol (ULTRAM) 50 MG tablet Take 1 tablet (50 mg total) by mouth every 6 (six) hours as needed. (Patient taking differently: Take 50 mg by mouth every 6 (six) hours as needed for moderate pain. ) 6 tablet 0 04/22/2016 at Unknown time    I have reviewed patient's Past Medical Hx, Surgical Hx, Family Hx, Social Hx, medications and allergies.   ROS:  Review of Systems  Constitutional: Negative for fever.  HENT: Negative for sinus pressure.   Eyes: Positive for photophobia.  Gastrointestinal: Negative for abdominal pain, anorexia, nausea and vomiting.  Musculoskeletal: Negative for back pain and neck pain.  Neurological: Positive for headaches and loss of balance. Negative for tingling and numbness.   Other systems negative  Physical Exam  Patient Vitals for the past 24 hrs:  BP Temp Pulse Resp SpO2  04/23/16 1936 108/76 98.6 F (37 C) (!) 104 20 100 %   Constitutional: Well-developed, well-nourished female in no acute distress but extraordinarily uncomfortable Shaking and holding her head.  Cardiovascular: normal rate and rhythm Respiratory: normal effort, clear to auscultation bilaterally GI:  Abd soft, non-tender, gravid appropriate for gestational age.   No rebound or guarding. MS: Extremities nontender, no edema, normal ROM Neurologic: Alert and oriented x 4. MAEW x 4.  No obvious neuro deficits GU: Neg CVAT.  PELVIC EXAM:    FHT:  A: 150, moderate with 10x10 accels, no decels B: 155, moderate with 10x10 accels, no decels Toco: no UCs   Labs: Results for orders placed or performed during the hospital encounter of 04/23/16 (from the past 24 hour(s))  Urinalysis, Routine w reflex microscopic     Status: Abnormal  Collection Time: 04/23/16  7:23 PM  Result Value Ref Range   Color, Urine YELLOW YELLOW   APPearance CLEAR CLEAR   Specific Gravity, Urine 1.019 1.005 - 1.030   pH 6.0 5.0 - 8.0   Glucose, UA NEGATIVE NEGATIVE mg/dL   Hgb urine dipstick NEGATIVE NEGATIVE   Bilirubin Urine NEGATIVE NEGATIVE   Ketones, ur NEGATIVE NEGATIVE mg/dL   Protein, ur NEGATIVE NEGATIVE mg/dL   Nitrite NEGATIVE NEGATIVE   Leukocytes, UA TRACE (A) NEGATIVE   RBC / HPF 0-5 0 - 5 RBC/hpf   WBC, UA 0-5 0 - 5 WBC/hpf   Bacteria, UA RARE (A) NONE SEEN   Squamous Epithelial / LPF 0-5 (A) NONE SEEN   Mucous PRESENT    Imaging:  Ct Head Wo Contrast  Result Date: 04/23/2016 CLINICAL DATA:  Worsening headache, frontal and all over, fell on 04/19/2016 striking her head, headache since worse today, pregnant EXAM: CT HEAD WITHOUT CONTRAST TECHNIQUE: Contiguous axial images were obtained from the base of the skull through the vertex without intravenous contrast. COMPARISON:  06/17/2005 FINDINGS: Brain: Normal ventricular morphology. No midline shift or mass effect. Normal appearance of brain parenchyma. No intracranial hemorrhage, mass lesion or evidence acute infarction. No extra-axial fluid collections. Vascular: Unremarkable Skull: Intact Sinuses/Orbits: Clear Other: N/A IMPRESSION: No acute intracranial abnormalities. Electronically Signed   By: [name] M.D.   On: 04/23/2016 21:02   MAU  Course/MDM: Will order Head CT Patient has had migraine cocktail (reglan, benadryl and decadron). She reports that her pain is not better at this time.  Patient has had  dilaudid  2320: Patient reports that there has been no improvement in her pain  2320: D/W Dr. Emelda Fear will place inpatient for pain management and have a neuro consult in the morning   Assessment: Fall in pregnancy 26 week gest Headache as sequelae of fall  Plan: Will admit for pain management Neuro consult  Tawnya Crook  11:24 PM 04/23/16  Wynelle Bourgeois CNM, MSN Certified Nurse-Midwife 04/23/2016 7:46 PM

## 2016-04-24 ENCOUNTER — Encounter (HOSPITAL_COMMUNITY): Payer: Self-pay

## 2016-04-24 ENCOUNTER — Observation Stay (HOSPITAL_COMMUNITY): Payer: Medicaid Other

## 2016-04-24 DIAGNOSIS — Z87891 Personal history of nicotine dependence: Secondary | ICD-10-CM | POA: Diagnosis not present

## 2016-04-24 DIAGNOSIS — W19XXXA Unspecified fall, initial encounter: Secondary | ICD-10-CM | POA: Diagnosis present

## 2016-04-24 DIAGNOSIS — Z8249 Family history of ischemic heart disease and other diseases of the circulatory system: Secondary | ICD-10-CM | POA: Diagnosis not present

## 2016-04-24 DIAGNOSIS — O26892 Other specified pregnancy related conditions, second trimester: Secondary | ICD-10-CM | POA: Diagnosis present

## 2016-04-24 DIAGNOSIS — O9989 Other specified diseases and conditions complicating pregnancy, childbirth and the puerperium: Secondary | ICD-10-CM | POA: Diagnosis not present

## 2016-04-24 DIAGNOSIS — G43009 Migraine without aura, not intractable, without status migrainosus: Secondary | ICD-10-CM

## 2016-04-24 DIAGNOSIS — M419 Scoliosis, unspecified: Secondary | ICD-10-CM | POA: Diagnosis present

## 2016-04-24 DIAGNOSIS — G44309 Post-traumatic headache, unspecified, not intractable: Secondary | ICD-10-CM | POA: Diagnosis present

## 2016-04-24 DIAGNOSIS — Z6791 Unspecified blood type, Rh negative: Secondary | ICD-10-CM | POA: Diagnosis not present

## 2016-04-24 DIAGNOSIS — Z8614 Personal history of Methicillin resistant Staphylococcus aureus infection: Secondary | ICD-10-CM | POA: Diagnosis not present

## 2016-04-24 DIAGNOSIS — R51 Headache: Secondary | ICD-10-CM

## 2016-04-24 DIAGNOSIS — O30032 Twin pregnancy, monochorionic/diamniotic, second trimester: Secondary | ICD-10-CM | POA: Diagnosis present

## 2016-04-24 DIAGNOSIS — Z3A26 26 weeks gestation of pregnancy: Secondary | ICD-10-CM | POA: Diagnosis not present

## 2016-04-24 DIAGNOSIS — Z833 Family history of diabetes mellitus: Secondary | ICD-10-CM | POA: Diagnosis not present

## 2016-04-24 LAB — RAPID URINE DRUG SCREEN, HOSP PERFORMED
Amphetamines: NOT DETECTED
BARBITURATES: NOT DETECTED
Benzodiazepines: NOT DETECTED
COCAINE: NOT DETECTED
Opiates: NOT DETECTED
TETRAHYDROCANNABINOL: NOT DETECTED

## 2016-04-24 MED ORDER — HYDROMORPHONE HCL 1 MG/ML IJ SOLN
1.0000 mg | INTRAMUSCULAR | Status: DC | PRN
  Filled 2016-04-24: qty 1

## 2016-04-24 MED ORDER — OXYCODONE-ACETAMINOPHEN 5-325 MG PO TABS
2.0000 | ORAL_TABLET | Freq: Once | ORAL | Status: AC
  Administered 2016-04-24: 2 via ORAL
  Filled 2016-04-24: qty 2

## 2016-04-24 MED ORDER — INDOMETHACIN 50 MG PO CAPS
50.0000 mg | ORAL_CAPSULE | Freq: Three times a day (TID) | ORAL | Status: DC
  Administered 2016-04-24 – 2016-04-26 (×5): 50 mg via ORAL
  Filled 2016-04-24 (×6): qty 1

## 2016-04-24 MED ORDER — AMITRIPTYLINE HCL 25 MG PO TABS
50.0000 mg | ORAL_TABLET | Freq: Every day | ORAL | Status: DC
  Administered 2016-04-24 – 2016-04-25 (×2): 50 mg via ORAL
  Filled 2016-04-24 (×3): qty 2

## 2016-04-24 MED ORDER — ZOLPIDEM TARTRATE 5 MG PO TABS: 5.0000 mg | ORAL_TABLET | Freq: Once | ORAL | Status: DC

## 2016-04-24 MED ORDER — HYDROMORPHONE HCL 2 MG PO TABS
2.0000 mg | ORAL_TABLET | ORAL | Status: DC | PRN
  Administered 2016-04-24 – 2016-04-26 (×6): 2 mg via ORAL
  Filled 2016-04-24 (×8): qty 1

## 2016-04-24 MED ORDER — CYCLOBENZAPRINE HCL 10 MG PO TABS
10.0000 mg | ORAL_TABLET | Freq: Once | ORAL | Status: DC
  Filled 2016-04-24: qty 1

## 2016-04-24 MED ORDER — CYCLOBENZAPRINE HCL 10 MG PO TABS
10.0000 mg | ORAL_TABLET | Freq: Once | ORAL | Status: AC
  Administered 2016-04-24: 10 mg via ORAL
  Filled 2016-04-24: qty 1

## 2016-04-24 MED ORDER — METOCLOPRAMIDE HCL 5 MG/ML IJ SOLN
10.0000 mg | Freq: Four times a day (QID) | INTRAMUSCULAR | Status: DC
  Administered 2016-04-25 – 2016-04-26 (×4): 10 mg via INTRAVENOUS
  Filled 2016-04-24 (×5): qty 2

## 2016-04-24 MED ORDER — SUMATRIPTAN SUCCINATE 100 MG PO TABS
100.0000 mg | ORAL_TABLET | Freq: Once | ORAL | Status: AC
  Administered 2016-04-24: 100 mg via ORAL
  Filled 2016-04-24: qty 1

## 2016-04-24 MED ORDER — BETAMETHASONE SOD PHOS & ACET 6 (3-3) MG/ML IJ SUSP
12.0000 mg | Freq: Once | INTRAMUSCULAR | Status: AC
  Administered 2016-04-24: 12 mg via INTRAMUSCULAR
  Filled 2016-04-24: qty 2

## 2016-04-24 NOTE — H&P (Signed)
Midwife Attested Obstetrics  MAU Provider Note Date of Service: 04/23/2016 7:46 PM    Attestation signed by Tilda Burrow, MD at 04/24/2016 12:00 AM  Attestation of Attending Supervision of Advanced Practitioner: Evaluation and management procedures were performed by the PA/NP/CNM/OB Fellow under my supervision/collaboration. Chart reviewed and agree with management and plan. We will observe overnight for pain control of headache with phenergan and demerol, and consult neurology for suspected post concussion headache.  Pt is intact from obstetric standpoint. No answer yet from night neurology.  My exam notable for intact CN 2-12, PERRL EOMI, some discomfort with EOM, even when eyes closed and inaccurate position sense of fingers bilaterally but otherwise wnl Stanford Strauch V 04/23/2016 11:57 PM      Expand All Collapse All   Hide copied text Chief Complaint:  Headache and Blurred Vision   First Provider Initiated Contact with Patient 04/23/16 1946     HPI: Jacqueline Clarke is a 31 y.o. [id number] at 64w4dwho presents to maternity admissions reporting severe, worsening headache, frontal and all over  [place] on 3/31 and hit her head,   Has had H/A since but much worse today.  Has never been imaged for that.  No speech or motor difficulties but "feels wobbly" when walking. She reports good fetal movement, denies LOF, vaginal bleeding, vaginal itching/burning, urinary symptoms, dizziness, n/v, diarrhea, constipation or fever/chills.    Headache   This is a recurrent problem. The current episode started in the past 7 days. The problem occurs constantly. The problem has been rapidly worsening. The pain is located in the bilateral, frontal and occipital region. The pain does not radiate. The pain quality is not similar to prior headaches. The quality of the pain is described as aching, band-like, squeezing and throbbing. The pain is at a severity of 10/10. The pain is severe. Associated symptoms  include a loss of balance, phonophobia, photophobia and scalp tenderness. Pertinent negatives include no abdominal pain, abnormal behavior, anorexia, back pain, fever, muscle aches, nausea, neck pain, numbness, sinus pressure, tingling or vomiting. The symptoms are aggravated by activity, bright light and noise. She has tried acetaminophen, NSAIDs and oral narcotics for the symptoms. The treatment provided no relief.    RN Note: Pt states she fell Saturday and hit her head. Head has been hurting every since, but this morning around 8am, she states HA was much worse and her nose "was pouring out blood." States she has taken everything that she was prescribed for HA, but nothing is helping. Pt denies vag bleeding or LOF. Denies contractions. Just pressure at times. +FM, although decreased today.   Past Medical History:     Past Medical History:  Diagnosis Date  . Anemia   . Anxiety   . Asthma   . Blood type, Rh negative   . Cholelithiasis   . Chronic back pain    scoliosis- on percocet  . Depression   . Headache(784.0)   . Hyperemesis complicating pregnancy, antepartum 2013  . 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 Obesity   . Pregnancy induced hypertension   . Scoliosis     Past obstetric history:                 OB History  Gravida Para Term Preterm AB Living  0 2 4  SAB TAB Ectopic Multiple Live Births  2 0 0   4    # Outcome Date GA Lbr Len/2nd Weight Sex  Delivery Anes PTL Lv  7 Current           6 Term 06/22/15 [redacted]w[redacted]d 07:35 / 00:18 9 lb 1.7 oz (4.13 kg) F Vag-Spont EPI  LIV  5 Term 07/22/11 [redacted]w[redacted]d 03:24 / 00:15 8 lb 15.4 oz (4.065 kg) F Vag-Spont EPI  LIV     Birth Comments: WNL  4 Term 2011 [redacted]w[redacted]d 04:00 6 lb 14 oz (3.118 kg) M Vag-Spont EPI  LIV  3 Term 2010 [redacted]w[redacted]d  7 lb 6 oz (3.345 kg) M Vag-Spont EPI  LIV  2 SAB 2009          1 SAB 2006              Past Surgical History:      Past Surgical History:   Procedure Laterality Date  . ADENOIDECTOMY    . ADENOIDECTOMY    . arm surgery    . CHOLECYSTECTOMY    . DILATION AND CURETTAGE OF UTERUS  2008  . MR WRIST RIGHT     sx to nerves  . NERVE, TENDON AND ARTERY REPAIR Right 06/18/2014   Procedure: NERVE, TENDON AND ARTERY REPAIR;  Surgeon: Bradly Bienenstock, MD;  Location: [facility] OR;  Service: Orthopedics;  Laterality: Right;  . TONSILLECTOMY AND ADENOIDECTOMY      Family History:      Family History  Problem Relation Age of Onset  . Heart disease Maternal Grandfather   . Diabetes Maternal Grandfather   . Heart disease Father   . Anesthesia problems Neg Hx     Social History:       Social History  Substance Use Topics  . Smoking status: Former Smoker    Packs/day: 0.50    Types: Cigarettes  . Smokeless tobacco: Never Used  . Alcohol use No     Comment: Occassionally    Allergies: No Known Allergies  Meds:         Prescriptions Prior to Admission  Medication Sig Dispense Refill Last Dose  . acetaminophen (TYLENOL) 325 MG tablet Take 650 mg by mouth every 6 (six) hours as needed for moderate pain.   04/22/2016 at Unknown time  . aspirin EC 81 MG tablet Take 1 tablet (81 mg total) by mouth daily. Take after 12 weeks for prevention of preeclampsia later in pregnancy 300 tablet 2 04/22/2016 at Unknown time  . cyclobenzaprine (FLEXERIL) 10 MG tablet Take 1 tablet by mouth 3 (three) times daily as needed for muscle spasms.   3 04/22/2016 at Unknown time  . Doxylamine-Pyridoxine (DICLEGIS) 10-10 MG TBEC Take 1 tablet with breakfast and lunch.  Take 2 tablets at bedtime. 100 tablet 4 Past Week at Unknown time  . NIFEdipine (PROCARDIA) 10 MG capsule Take 1 capsule (10 mg total) by mouth every 6 (six) hours as needed (contractions). For more than 5 contractions per hour 30 capsule 0   . traMADol (ULTRAM) 50 MG tablet Take 1 tablet (50 mg total) by mouth every 6 (six) hours as needed. (Patient taking differently:  Take 50 mg by mouth every 6 (six) hours as needed for moderate pain. ) 6 tablet 0 04/22/2016 at Unknown time    I have reviewed patient's Past Medical Hx, Surgical Hx, Family Hx, Social Hx, medications and allergies.   ROS:  Review of Systems  Constitutional: Negative for fever.  HENT: Negative for sinus pressure.   Eyes: Positive for photophobia.  Gastrointestinal: Negative for abdominal pain, anorexia, nausea and vomiting.  Musculoskeletal: Negative for back pain and  neck pain.  Neurological: Positive for headaches and loss of balance. Negative for tingling and numbness.   Other systems negative  Physical Exam  Patient Vitals for the past 24 hrs:  BP Temp Pulse Resp SpO2  04/23/16 1936 108/76 98.6 F (37 C) (!) 104 20 100 %   Constitutional: Well-developed, well-nourished female in no acute distress but extraordinarily uncomfortable Shaking and holding her head.  Cardiovascular: normal rate and rhythm Respiratory: normal effort, clear to auscultation bilaterally GI: Abd soft, non-tender, gravid appropriate for gestational age.   No rebound or guarding. MS: Extremities nontender, no edema, normal ROM Neurologic: Alert and oriented x 4. MAEW x 4.  No obvious neuro deficits GU: Neg CVAT.  PELVIC EXAM:    FHT:  A: 150, moderate with 10x10 accels, no decels B: 155, moderate with 10x10 accels, no decels Toco: no UCs   Labs: Lab Results Last 24 Hours       Results for orders placed or performed during the hospital encounter of 04/23/16 (from the past 24 hour(s))  Urinalysis, Routine w reflex microscopic     Status: Abnormal   Collection Time: 04/23/16  7:23 PM  Result Value Ref Range   Color, Urine YELLOW YELLOW   APPearance CLEAR CLEAR   Specific Gravity, Urine 1.019 1.005 - 1.030   pH 6.0 5.0 - 8.0   Glucose, UA NEGATIVE NEGATIVE mg/dL   Hgb urine dipstick NEGATIVE NEGATIVE   Bilirubin Urine NEGATIVE NEGATIVE   Ketones, ur NEGATIVE NEGATIVE mg/dL    Protein, ur NEGATIVE NEGATIVE mg/dL   Nitrite NEGATIVE NEGATIVE   Leukocytes, UA TRACE (A) NEGATIVE   RBC / HPF 0-5 0 - 5 RBC/hpf   WBC, UA 0-5 0 - 5 WBC/hpf   Bacteria, UA RARE (A) NONE SEEN   Squamous Epithelial / LPF 0-5 (A) NONE SEEN   Mucous PRESENT      Imaging:  Ct Head Wo Contrast  Result Date: 04/23/2016 CLINICAL DATA:  Worsening headache, frontal and all over, fell on 04/19/2016 striking her head, headache since worse today, pregnant EXAM: CT HEAD WITHOUT CONTRAST TECHNIQUE: Contiguous axial images were obtained from the base of the skull through the vertex without intravenous contrast. COMPARISON:  06/17/2005 FINDINGS: Brain: Normal ventricular morphology. No midline shift or mass effect. Normal appearance of brain parenchyma. No intracranial hemorrhage, mass lesion or evidence acute infarction. No extra-axial fluid collections. Vascular: Unremarkable Skull: Intact Sinuses/Orbits: Clear Other: N/A IMPRESSION: No acute intracranial abnormalities. Electronically Signed   By: [name] M.D.   On: 04/23/2016 21:02   MAU Course/MDM: Will order Head CT Patient has had migraine cocktail (reglan, benadryl and decadron). She reports that her pain is not better at this time.  Patient has had  dilaudid  2320: Patient reports that there has been no improvement in her pain  2320: D/W Dr. Emelda Fear will place inpatient for pain management and have a neuro consult in the morning   Assessment: Fall in pregnancy 26 week gest Headache as sequelae of fall  Plan: Will admit for pain management Neuro consult  Jacqueline Clarke  11:24 PM 04/23/16  Wynelle Bourgeois CNM, MSN Certified Nurse-Midwife 04/23/2016 7:46 PM     Cosigned by: Tilda Burrow, MD at 04/24/2016 12:00 AM

## 2016-04-24 NOTE — Progress Notes (Signed)
Notified by Lucien Mons that they called to secretary here to set up transportation.  Secretary informed nurses it was done.  [facility] had not been called.  New arrangements made by this nurse for pick up at 1045 for 1115 imaging. No further.

## 2016-04-24 NOTE — Progress Notes (Signed)
1315, gave pain meds to patient, placed cool wet rag on her forehead, turned off phone.. Massage therapist at bedside.  Patient NOT to be disturbed for vitals or monitoring.  Patient to call out when she wakes.

## 2016-04-24 NOTE — Progress Notes (Addendum)
Patient ID: Jacqueline Clarke, female   DOB: [birth year], 31 y.o.   MRN: [id number] [facility] ANTEPARTUM COMPREHENSIVE PROGRESS NOTE  Jacqueline Clarke is a 31 y.o. [id number] at [redacted]w[redacted]d  who is admitted for severe HA 4 days after a fall. Pt with a mono/di twin IUP.    Length of Stay:  0  Days  Subjective: Pt continues to complain of severe HA. She reports no relief from any of the meds besides the Dilaudid PCA. She slept for 3 hours but, says the HA woke her up.  Patient reports good fetal movement.  She reports no uterine contractions, no bleeding and no loss of fluid per vagina.  Vitals:  Blood pressure 112/62, pulse 86, temperature 97.4 F (36.3 C), temperature source Oral, resp. rate 18, height  (1.803 m), weight (!) 311 lb (141.1 kg), last menstrual period 10/20/2015, SpO2 96 %, currently breastfeeding. Physical Examination: General appearance - in mild to moderate distress and crying Neurological - alert, oriented, normal speech, no focal findings or movement disorder noted Cervical Exam: Not evaluated.   Fetal Monitoring:  Baseline: 140's bpm and Variability: Good {> 6 bpm)x2  Labs:  No results found for this or any previous visit (from the past 24 hour(s)).  Imaging Studies:    MRA/MRI/MRV neg      Medications:  Scheduled . cyclobenzaprine  10 mg Oral Once  . docusate sodium  100 mg Oral Daily  . prenatal multivitamin  1 tablet Oral Q1200  . zolpidem  5 mg Oral Once   I have reviewed the patient's current medications.  ASSESSMENT: Patient Active Problem List   Diagnosis Date Noted  . Post-concussion headache 04/23/2016  . Threatened premature labor, antepartum 04/22/2016  . Encounter for procreative genetic counseling   . Low vitamin D level 2016-01-19  . 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 Supervision of high-risk pregnancy 12/26/2015  . Monochorionic diamniotic twin gestation 12/19/2015  . Depression 07/09/2015     PLAN: Will add Elavil  q HS and schedule the Reglan Attempting a trail of 100% O2 with a nonrebreather Will reconsult neuro in the am if no relief overnight.   Continue routine antenatal care.   [name] 04/24/2016,9:35 PM

## 2016-04-24 NOTE — Progress Notes (Signed)
MD at bedside. 

## 2016-04-24 NOTE — Progress Notes (Signed)
Patient's Grandfather died from [place].  Patient is scared. Emotional support given, patient is calm and feels safe.

## 2016-04-24 NOTE — Consult Note (Addendum)
NEURO HOSPITALIST CONSULT NOTE   Requestig physician: Dr. Emelda Fear  Reason for Consult: Migrainous headache after a fall striking right occiput  History obtained from:  Patient and Chart    HPI:                                                                                                                                          Jacqueline Clarke is an 31 y.o. female who presented on Wednesday evening with severe 9/10 throbbing headache. She states that it originates in her right occipital area and migrates over the top of her head to her forehead bilaterally. Positive for photophobia and sonophobia. No osmophobia. Moving her body and performing Valsalva maneuver worsens the headache. Has had associated nausea and vomiting. Has a history of migrainous headaches during prior pregnancies. Her headache began during the night after she fell and struck her head on Saturday. There was no LOC, but she did bleed profusely from her nose. None of the medications she has taken has helped with her headache.   Past Medical History:  Diagnosis Date  . Anemia   . Anxiety   . Asthma   . Blood type, Rh negative   . Cholelithiasis   . Chronic back pain    scoliosis- on percocet  . Depression   . Headache(784.0)   . Hyperemesis complicating pregnancy, antepartum 2013  . 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 Obesity   . Pregnancy induced hypertension   . Scoliosis     Past Surgical History:  Procedure Laterality Date  . ADENOIDECTOMY    . ADENOIDECTOMY    . arm surgery    . CHOLECYSTECTOMY    . DILATION AND CURETTAGE OF UTERUS  2008  . MR WRIST RIGHT     sx to nerves  . NERVE, TENDON AND ARTERY REPAIR Right 06/18/2014   Procedure: NERVE, TENDON AND ARTERY REPAIR;  Surgeon: Bradly Bienenstock, Clarke;  Location: [facility] OR;  Service: Orthopedics;  Laterality: Right;  . TONSILLECTOMY AND ADENOIDECTOMY      Family History  Problem Relation Age of Onset  . Heart disease  Maternal Grandfather   . Diabetes Maternal Grandfather   . Heart disease Father   . Anesthesia problems Neg Hx    Social History:  reports that she has quit smoking. Her smoking use included Cigarettes. She smoked 0.50 packs per day. She has never used smokeless tobacco. She reports that she does not drink alcohol or use drugs.  No Known Allergies  MEDICATIONS:  Prior to Admission:  Prescriptions Prior to Admission  Medication Sig Dispense Refill Last Dose  . acetaminophen (TYLENOL) 325 MG tablet Take 650 mg by mouth every 6 (six) hours as needed for moderate pain.   04/23/2016 at Unknown time  . aspirin EC 81 MG tablet Take 1 tablet (81 mg total) by mouth daily. Take after 12 weeks for prevention of preeclampsia later in pregnancy 300 tablet 2 04/23/2016 at Unknown time  . cyclobenzaprine (FLEXERIL) 10 MG tablet Take 1 tablet by mouth 3 (three) times daily as needed for muscle spasms.   3 04/23/2016 at Unknown time  . Doxylamine-Pyridoxine (DICLEGIS) 10-10 MG TBEC Take 1 tablet with breakfast and lunch.  Take 2 tablets at bedtime. 100 tablet 4 04/23/2016 at Unknown time  . ibuprofen (ADVIL,MOTRIN) 200 MG tablet Take 400 mg by mouth every 6 (six) hours as needed for moderate pain.   04/23/2016 at Unknown time  . NIFEdipine (PROCARDIA) 10 MG capsule Take 1 capsule (10 mg total) by mouth every 6 (six) hours as needed (contractions). For more than 5 contractions per hour 30 capsule 0 04/23/2016 at 1200  . traMADol (ULTRAM) 50 MG tablet Take 1 tablet (50 mg total) by mouth every 6 (six) hours as needed. (Patient taking differently: Take 50 mg by mouth every 6 (six) hours as needed for moderate pain. ) 6 tablet 0 04/23/2016 at Unknown time   Scheduled: . docusate sodium  100 mg Oral Daily  . HYDROmorphone   Intravenous Q4H  . prenatal multivitamin  1 tablet Oral Q1200    ROS:                                                                                                                                        History obtained from patient. No chest pain, abdominal pain or limb pain. Other ROS as per HPI.   Blood pressure 128/79, pulse 92, temperature 98.5 F (36.9 C), temperature source Oral, resp. rate 20, last menstrual period 10/20/2015, SpO2 96 %, currently breastfeeding.  General Examination:                                                                                                      HEENT-  Tender to palpation to temporalis and posterior paraspinal muscles of neck bilaterally, with transient worsening of headache pain    Lungs- Respirations unlabored Extremities- Warm and well-perfused. Right ankle splint noted  Neurological Examination Mental Status: Alert, oriented, thought content appropriate.  Pained affect.  Intermittently tearful. Speech fluent without evidence of aphasia.  Able to follow all commands without difficulty. Cranial Nerves: II: Visual fields intact.  PERRL.  III,IV, VI: ptosis not present, EOMI without nystagmus V,VII: Temperature sensation intact bilaterally. Facial motor function is symmetric.  VIII: hearing intact to conversation IX,X: Palate elevates normally XI: Symmetric XII: midline tongue extension Motor: Right : Upper extremity   5/5    Left:     Upper extremity   5/5  Lower extremity   5/5     Lower extremity   5/5 Normal tone throughout; no atrophy noted Sensory: Decreased temperature and fine touch sensation to RLE. Sensation otherwise normal. No extinction.   Deep Tendon Reflexes: 1+ brachioradialis and biceps bilaterally. 3+ patellae. 2+ left achilles. Right achilles deferred due to right ankle splint.  Plantars: Right: downgoing   Left: downgoing Cerebellar: No ataxia with FNF bilaterally Gait: Deferred due to right ankle splint   Lab Results: Basic Metabolic Panel:  Recent Labs Lab 04/21/16 2138  NA 138  K 3.4*  CL 106   CO2 26  GLUCOSE 81  BUN 8  CREATININE 0.60  CALCIUM 8.8*    Liver Function Tests:  Recent Labs Lab 04/21/16 2138  AST 31  ALT 20  ALKPHOS 108  BILITOT 0.3  PROT 8.1  ALBUMIN 2.9*   No results for input(s): LIPASE, AMYLASE in the last 168 hours. No results for input(s): AMMONIA in the last 168 hours.  CBC:  Recent Labs Lab 04/21/16 2138  WBC 12.0*  NEUTROABS 9.0*  HGB 10.2*  HCT 32.1*  MCV 82.9  PLT 231    Cardiac Enzymes: No results for input(s): CKTOTAL, CKMB, CKMBINDEX, TROPONINI in the last 168 hours.  Lipid Panel: No results for input(s): CHOL, TRIG, HDL, CHOLHDL, VLDL, LDLCALC in the last 168 hours.  CBG: No results for input(s): GLUCAP in the last 168 hours.  Microbiology: Results for orders placed or performed during the hospital encounter of 04/21/16  Culture, Urine     Status: Abnormal   Collection Time: 04/21/16  9:31 PM  Result Value Ref Range Status   Specimen Description URINE, CLEAN CATCH  Final   Special Requests NONE  Final   Culture MULTIPLE SPECIES PRESENT, SUGGEST RECOLLECTION (A)  Final   Report Status 04/23/2016 FINAL  Final    Coagulation Studies: No results for input(s): LABPROT, INR in the last 72 hours.  Imaging: Ct Head Wo Contrast  Result Date: 04/23/2016 CLINICAL DATA:  Worsening headache, frontal and all over, fell on 04/19/2016 striking her head, headache since worse today, pregnant EXAM: CT HEAD WITHOUT CONTRAST TECHNIQUE: Contiguous axial images were obtained from the base of the skull through the vertex without intravenous contrast. COMPARISON:  06/17/2005 FINDINGS: Brain: Normal ventricular morphology. No midline shift or mass effect. Normal appearance of brain parenchyma. No intracranial hemorrhage, mass lesion or evidence acute infarction. No extra-axial fluid collections. Vascular: Unremarkable Skull: Intact Sinuses/Orbits: Clear Other: N/A IMPRESSION: No acute intracranial abnormalities. Electronically Signed   By:  [name] M.D.   On: 04/23/2016 21:02   [place] Mfm Ob Transvaginal  Result Date: 04/23/2016 ----------------------------------------------------------------------  OBSTETRICS REPORT                      (Signed Final 04/23/2016 07:01 pm) ---------------------------------------------------------------------- Patient Info  ID #:       [id number]  D.O.B.:   23-Aug-1985 (30 yrs)  Name:       Jacqueline Clarke                    Visit Date:  04/22/2016 07:48 pm              Jacqueline Clarke ---------------------------------------------------------------------- Performed By  Performed By:     Jacqueline Clarke        Ref. Address:     [address] [place],                                                             [place] [address]  Attending:        Particia Nearing Clarke       Secondary Phy.:   MAU Nursing-                                                             MAU/Triage  Referred By:      Jacqueline Clarke             Location:         [facility]                    [facility] NP ---------------------------------------------------------------------- Orders   #  Description                                 Code   1  [place] MFM OB TRANSVAGINAL                      [phone]  ----------------------------------------------------------------------   #  Ordered By               Order #        Accession #    Episode #   1  Jacqueline LEFTWICH-           [id number]      [id number]     [id number]      Jacqueline Clarke  ---------------------------------------------------------------------- Indications   [redacted] weeks gestation of pregnancy                Z3A.26   Twin pregnancy, mono/di, second trimester;     O30.032   ECHOs normal   Short interval between pregancies, 2nd         O09.892   trimester   Preterm contractions  O47.00   Maternal morbid obesity                        O99.210 E66.01   ---------------------------------------------------------------------- OB History  Blood Type:            Height:  5'11"  Weight (lb):  295      BMI:   41.14  Gravidity:    7         Term:   4        Prem:   0        SAB:   2  TOP:          0       Ectopic:  0        Living: 4 ---------------------------------------------------------------------- Fetal Evaluation (Fetus A)  Num Of Fetuses:     2  Cardiac Activity:   Not assessed ---------------------------------------------------------------------- Gestational Age (Fetus A)  LMP:           26w 3d       Date:   10/20/15                 EDD:   07/26/16  Best:          [place] 3d    Det. By:   LMP  (10/20/15)          EDD:   07/26/16 ---------------------------------------------------------------------- Fetal Evaluation (Fetus B)  Num Of Fetuses:     2 ---------------------------------------------------------------------- Gestational Age (Fetus B)  LMP:           26w 3d       Date:   10/20/15                 EDD:   07/26/16  Best:          [place] 3d    Det. By:   LMP  (10/20/15)          EDD:   07/26/16 ---------------------------------------------------------------------- Cervix Uterus Adnexa  Cervix  Length:            3.6  cm.  Normal appearance by transvaginal scan. No change from 4-1 ---------------------------------------------------------------------- Impression  Monochorionic/diamniotic twin pregnancy at 26+3 weeks  EV views of cervix: normal length without funneling ---------------------------------------------------------------------- Recommendations  Keep [place] appt on 04/06 for growth ----------------------------------------------------------------------                 Jacqueline Nearing, Clarke Electronically Signed Final Report   04/23/2016 07:01 pm ----------------------------------------------------------------------   Assessment: Severe holocephalic throbbing headache following head impact from a fall without LOC 1. Presentation most consistent with status  migrainosus. Low on DDx but also possible would be cerebral venous sinus thrombosis or aneurysmal rupture with subarachnoid hemorrhage not detectable on CT 2. Neurological exam is nonfocal except for subjective fine touch and temperature sensory deficit to RLE 3. MSK exam reveals TTP to posterior nuchal musculature and temporalis muscles bilaterally 4. 26 weeks, 4 days pregnant   Recommendations: 1. MRI and MRV of brain WITHOUT contrast.  2. MRA of head to rule out aneurysm 3. Continue pharmacological treatment of headache pain.   Electronically signed: Dr. Caryl Clarke 04/24/2016, 6:32 AM

## 2016-04-24 NOTE — Progress Notes (Signed)
On review of the chart, patient had BMZ on 04/22/16, but did not have a dose on 04/23/16. Order placed for second BMZ now to complete series.  Tawnya Crook  4:06 AM 04/24/16

## 2016-04-24 NOTE — Progress Notes (Signed)
Patient has eaten 80 percent of her supper and is looking at her phone.  Migraine medicine given, then patient asks for tums, then patient asks for water.

## 2016-04-24 NOTE — Progress Notes (Signed)
Patient called out to nurse, patient still in pain and crying, patient's vital signs are WNL.  Call placed to MD.

## 2016-04-24 NOTE — Progress Notes (Signed)
Patient now in MR for the MRA/MRV/MRI as recommended by neurology. Will need to call neurologist on call after results available. Pt will be a candidate for d/c later today if these are normal, will need Rx for analgesics, likely to require po dilaudid ,

## 2016-04-24 NOTE — Progress Notes (Addendum)
Neurology Progress Note  MRI brain, MRV head, and MRA head have been completed. I have personally reviewed these studies. MRI brain is unremarkable. MRV shows patent venous sinuses and deep cerebral veins with no evidence of thrombosis. MRA is unremarkable though mildly degraded by patient motion.   I have reviewed the radiologist's interpretation of these studies and concur.   Impression: No evidence of intracranial pathology on imaging that would explain headache. Would continue treatment for presumed migraine. No additional recommendations at this time. May f/u with outpatient neurology as needed.

## 2016-04-25 ENCOUNTER — Encounter (HOSPITAL_COMMUNITY): Payer: Self-pay

## 2016-04-25 ENCOUNTER — Inpatient Hospital Stay (HOSPITAL_COMMUNITY): Payer: Medicaid Other

## 2016-04-25 ENCOUNTER — Inpatient Hospital Stay (HOSPITAL_COMMUNITY)
Admission: RE | Admit: 2016-04-25 | Discharge: 2016-04-25 | Disposition: A | Discharge status: Home / Self Care | Payer: Medicaid Other | Source: Ambulatory Visit | Attending: Certified Nurse Midwife | Admitting: Certified Nurse Midwife

## 2016-04-25 DIAGNOSIS — O9989 Other specified diseases and conditions complicating pregnancy, childbirth and the puerperium: Secondary | ICD-10-CM

## 2016-04-25 DIAGNOSIS — O30039 Twin pregnancy, monochorionic/diamniotic, unspecified trimester: Secondary | ICD-10-CM

## 2016-04-25 LAB — PROTEIN / CREATININE RATIO, URINE
Creatinine, Urine: 55 mg/dL
Protein Creatinine Ratio: 0.13 mg/mg{Cre} (ref 0.00–0.15)
TOTAL PROTEIN, URINE: 7 mg/dL

## 2016-04-25 LAB — TYPE AND SCREEN
ABO/RH(D): O NEG
Antibody Screen: NEGATIVE

## 2016-04-25 MED ORDER — SODIUM CHLORIDE 0.9 % IV SOLN
250.0000 mg | Freq: Four times a day (QID) | INTRAVENOUS | Status: AC
  Administered 2016-04-25 – 2016-04-26 (×4): 250 mg via INTRAVENOUS
  Filled 2016-04-25 (×4): qty 4

## 2016-04-25 MED ORDER — LORAZEPAM 2 MG/ML IJ SOLN: 1.0000 mg | Freq: Four times a day (QID) | INTRAMUSCULAR | Status: DC | PRN

## 2016-04-25 MED ORDER — LORAZEPAM 1 MG PO TABS: 1.0000 mg | ORAL_TABLET | Freq: Four times a day (QID) | ORAL | Status: DC | PRN

## 2016-04-25 NOTE — Progress Notes (Signed)
Patient ID: Jacqueline Clarke, female   DOB: 1985/05/16, 31 y.o.   MRN: [id number] [facility] ANTEPARTUM COMPREHENSIVE PROGRESS NOTE  Jacqueline Clarke is a 31 y.o. [id number] at [redacted]w[redacted]d  who is admitted for post concussive HA. Mono/di twin IUP   Fetal presentation is unsure. Length of Stay:  1  Days  Subjective: Pt reports that the O2 helped 'just a little' she was able to sleep ~5hours but, was awakened by the HA. She reports that right now it is just pounding.  Her IV infiltrated overnight and is currently out.  She expressed frustration over the lack of relief of her sx.   She c/o photophobia at present. Patient reports good fetal movement.  She reports no uterine contractions, no bleeding and no loss of fluid per vagina.  Vitals:  Blood pressure 107/66, pulse 65, temperature 98.3 F (36.8 C), temperature source Oral, resp. rate 18, height  (1.803 m), weight (!) 311 lb (141.1 kg), last menstrual period 10/20/2015, SpO2 96 %, currently breastfeeding. Physical Examination: General appearance - in mild to moderate distress; earlier when I walked in she was asleep but, she was still holding her head in her sleep. She appear less distressed this am but, only slightly so.   Abdomen - soft, nontender, nondistended, no masses or organomegaly gravid Neurological - alert, oriented, normal speech, no focal findings or movement disorder noted, screening mental status exam normal Extremities - peripheral pulses normal, no pedal edema, no clubbing or cyanosis Cervical Exam: Not evaluated.  Membranes:intact  Fetal Monitoring:  Baseline: 150's x2 bpm, Variability: Good {> 6 bpm) and Accelerations: Non-reactive but appropriate for gestational age x2  Labs:  Results for orders placed or performed during the hospital encounter of 04/23/16 (from the past 24 hour(s))  Protein / creatinine ratio, urine   Collection Time: 04/24/16 11:20 PM  Result Value Ref Range   Creatinine, Urine 55.00  mg/dL   Total Protein, Urine 7 mg/dL   Protein Creatinine Ratio 0.13 0.00 - 0.15 mg/mg[Cre]    Imaging Studies:    MRI/MRA/MRV all neg 04/24/2016   Medications:  Scheduled . amitriptyline  50 mg Oral QHS  . docusate sodium  100 mg Oral Daily  . indomethacin  50 mg Oral TID WC  . metoCLOPramide (REGLAN) injection  10 mg Intravenous Q6H  . prenatal multivitamin  1 tablet Oral Q1200   I have reviewed the patient's current medications.  ASSESSMENT: Patient Active Problem List   Diagnosis Date Noted  . Post-concussion headache 04/23/2016  . Threatened premature labor, antepartum 04/22/2016  . Encounter for procreative genetic counseling   . Low vitamin D level January 13, 2016  . 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 Supervision of high-risk pregnancy 12/26/2015  . Monochorionic diamniotic twin gestation 12/19/2015  . Depression 07/09/2015    PLAN: Will try the O2 again Keep the Elavil  q HS Indocin  tid for 2 days Restart the Reglan once her IV is replaced Dilaudid IV for severe pain no relieved with other meds reconsult Neuro today   Continue routine antenatal care.   Jacqueline Clarke 04/25/2016,6:43 AM

## 2016-04-25 NOTE — Progress Notes (Signed)
Faculty Practice OB/GYN Attending Note  Talked to Neurologist on call (Dr. Roxy Manns) about patient's worsening headaches and photophobia. He reviewed chart and medications; feels no further imaging studies were indicated and there is no indication for spinal tap.  Recommended trial of Solumedrol 250 mg IV q6h x 4 doses. If this does not work, we could try Magnesium Sulfate 1 g IV q8h x 3 doses. Will order Solumedrol, give analgesia as needed.  Continue close observation.    Jacqueline Collins, MD, FACOG Attending Obstetrician & Gynecologist Faculty Practice, [facility]

## 2016-04-25 NOTE — Progress Notes (Signed)
Patient stuck multiple times on unit for IV access, requested CRNA to come start IV. Unsuccessful attempts by CRNA. Patient resting well in no acute distress.

## 2016-04-26 DIAGNOSIS — G44309 Post-traumatic headache, unspecified, not intractable: Secondary | ICD-10-CM

## 2016-04-26 MED ORDER — HYDROMORPHONE HCL 2 MG PO TABS: 2.0000 mg | ORAL_TABLET | ORAL | 0 refills | Status: DC | PRN

## 2016-04-26 MED ORDER — AMITRIPTYLINE HCL 50 MG PO TABS: 50.0000 mg | ORAL_TABLET | Freq: Every day | ORAL | 0 refills | Status: DC

## 2016-04-26 NOTE — Discharge Summary (Signed)
Physician Discharge Summary  Patient ID: Jacqueline Clarke MRN: [id number] DOB/AGE: 20-Nov-1985 31 y.o.  Admit date: 04/23/2016 Discharge date: 04/26/2016  Admission Diagnoses:Headache, postconcussion versus migraine            Pregnancy 26 weeks 4 days, twin gestation pregnancy monochorionic diamniotic twins with symmetric growth  Discharge Diagnoses:  Active Problems:   Post-concussion headache Pregnancy [redacted] weeks gestation twin pregnancy monochorionic diamniotic twins with symmetric growth, not delivered  Discharged Condition: fair  Hospital Course: Patient was admitted as follows:   HPI: Jacqueline Clarke is a 31 y.o. [phone] at 47w4dwho presents to maternity admissions reporting severe, worsening headache, frontal and all over  [place] on 3/31 and hit her head,   Has had H/A since but much worse today.  Has never been imaged for that.  No speech or motor difficulties but "feels wobbly" when walking. She reports good fetal movement, denies LOF, vaginal bleeding, vaginal itching/burning, urinary symptoms, dizziness, n/v, diarrhea, constipation or fever/chills.    Headache   This is a recurrent problem. The current episode started in the past 7 days. The problem occurs constantly. The problem has been rapidly worsening. The pain is located in the bilateral, frontal and occipital region. The pain does not radiate. The pain quality is not similar to prior headaches. The quality of the pain is described as aching, band-like, squeezing and throbbing. The pain is at a severity of 10/10. The pain is severe. Associated symptoms include a loss of balance, phonophobia, photophobia and scalp tenderness. Pertinent negatives include no abdominal pain, abnormal behavior, anorexia, back pain, fever, muscle aches, nausea, neck pain, numbness, sinus pressure, tingling or vomiting. The symptoms are aggravated by activity, bright light and noise. She has tried acetaminophen, NSAIDs and oral narcotics for  the symptoms. The treatment provided no relief.    RN Note: Pt states she fell Saturday and hit her head. Head has been hurting every since, but this morning around 8am, she states HA was much worse and her nose "was pouring out blood." States she has taken everything that she was prescribed for HA, but nothing is helping. Pt denies vag bleeding or LOF. Denies contractions. Just pressure at times. +FM, although decreased today.  Patient was admitted and given IV analgesics including Dilaudid, and Flexeril oral she had limited response. Neurology consult was obtained and further MR testing in addition to the CT done upon arrival here was ordered and returned negative for intra-cranial lesions or bleeding Neurology recommendations after 2 days was to proceed with IV Solu-Medrol at high doses. She responded adequately not to use magnesium sulfate .she responded adequately though there was some residual headache that we felt she could be managed as outpatient with Dilaudid and Flexeril and she will be followed up in 1 week at [facility] clinic  Consults: neurology  Significant Diagnostic Studies: radiology: CT of head normal, MRI: MR I MRV MRA of the brain all negative  Treatments: IV hydration and steroids: solu-medrol and at high doses 250 mg IV every 6 hours on the day 04/25/2016  Discharge Exam: Blood pressure (!) 95/45, pulse 88, temperature 98.5 F (36.9 C), temperature source Oral, resp. rate 18, height  (1.803 m), weight (!) 141.1 kg (311 lb), last menstrual period 10/20/2015, SpO2 96 %, currently breastfeeding. General appearance: alert, cooperative and fatigued Head: Normocephalic, without obvious abnormality, atraumatic, No evidence of trauma GI: Twin gestation with size appropriate for twins, reactive NSTs done prior to discharge  Disposition: 01-Home or Self  Care  Discharge Instructions    Call MD for:  severe uncontrolled pain    Complete by:  As directed    Diet -  low sodium heart healthy    Complete by:  As directed    Discharge instructions    Complete by:  As directed    Expect gradual recovery of headache over 1-3 weeks.   Increase activity slowly    Complete by:  As directed      Allergies as of 04/26/2016   No Known Allergies     Medication List    STOP taking these medications   ibuprofen 200 MG tablet Commonly known as:  ADVIL,MOTRIN   traMADol 50 MG tablet Commonly known as:  ULTRAM     TAKE these medications   acetaminophen 325 MG tablet Commonly known as:  TYLENOL Take 650 mg by mouth every 6 (six) hours as needed for moderate pain.   amitriptyline 50 MG tablet Commonly known as:  ELAVIL Take 1 tablet (50 mg total) by mouth at bedtime. For headache,   aspirin EC 81 MG tablet Take 1 tablet (81 mg total) by mouth daily. Take after 12 weeks for prevention of preeclampsia later in pregnancy   cyclobenzaprine 10 MG tablet Commonly known as:  FLEXERIL Take 1 tablet by mouth 3 (three) times daily as needed for muscle spasms.   Doxylamine-Pyridoxine 10-10 MG Tbec Commonly known as:  DICLEGIS Take 1 tablet with breakfast and lunch.  Take 2 tablets at bedtime.   HYDROmorphone 2 MG tablet Commonly known as:  DILAUDID Take 1 tablet (2 mg total) by mouth every 4 (four) hours as needed for moderate pain or severe pain (headache).   NIFEdipine 10 MG capsule Commonly known as:  PROCARDIA Take 1 capsule (10 mg total) by mouth every 6 (six) hours as needed (contractions). For more than 5 contractions per hour      Follow-up Information    [facility] Follow up in 1 week(s).   Specialty:  Obstetrics and Gynecology Contact information: [address] [place] [address] [phone]          Signed: Tilda Burrow 04/26/2016, 7:59 AM

## 2016-04-26 NOTE — Discharge Instructions (Signed)
Concussion, Adult  A concussion is a brain injury from a direct hit (blow) to the head or body. This injury causes the brain to shake quickly back and forth inside the skull. It is caused by:   A hit to the head.   A quick and sudden movement (jolt) of the head or neck.    How fast you will get better from a concussion depends on many things like how bad your concussion was, what part of your brain was hurt, how old you are, and how healthy you were before the concussion. Recovery can take time. It is important to wait to return to activity until a doctor says it is safe and your symptoms are all gone.  Follow these instructions at home:  Activity   Limit activities that need a lot of thought or concentration. These include:  ? Homework or work for your job.  ? Watching TV.  ? Computer work.  ? Playing memory games and puzzles.   Rest. Rest helps the brain to heal. Make sure you:  ? Get plenty of sleep at night. Do not stay up late.  ? Go to bed at the same time every day.  ? Rest during the day. Take naps or rest breaks when you feel tired.   It can be dangerous if you get another concussion before the first one has healed Do not do activities that could cause a second concussion, such as riding a bike or playing sports.   Ask your doctor when you can return to your normal activities, like driving, riding a bike, or using machinery. Your ability to react may be slower. Do not do these activities if you are dizzy. Your doctor will likely give you a plan for slowly going back to activities.  General instructions   Take over-the-counter and prescription medicines only as told by your doctor.   Do not drink alcohol until your doctor says you can.   If it is harder than usual to remember things, write them down.   If you are easily distracted, try to do one thing at a time. For example, do not try to watch TV while making dinner.   Talk with family members or close friends when you need to make important  decisions.   Watch your symptoms and tell other people to do the same. Other problems (complications) can happen after a concussion. Older adults with a brain injury may have a higher risk of serious problems, such as a blood clot in the brain.   Tell your teachers, school nurse, school counselor, coach, athletic trainer, or work manager about your injury and symptoms. Tell them about what you can or cannot do. They should watch for:  ? More problems with attention or concentration.  ? More trouble remembering or learning new information.  ? More time needed to do tasks or assignments.  ? Being more annoyed (irritable) or having a harder time dealing with stress.  ? Any other symptoms that get worse.   Keep all follow-up visits as told by your health care provider. This is important.  Prevention   It is very important that you donot get another brain injury, especially before you have healed. In rare cases, another injury can cause permanent brain damage, brain swelling, or death. You have the most risk if you get another head injury in the first 7-10 days after you were hurt before. To avoid injuries:  ? Wear a seat belt when you ride in   a car.  ? Do not drink too much alcohol.  ? Avoid activities that could make you get a second concussion, like contact sports.  ? Wear a helmet when you do activities like:   Biking.   Skiing.   Skateboarding.   Skating.  ? Make your home safe by:   Removing things from the floor or stairs that could make you trip.   Using grab bars in bathrooms and handrails by stairs.   Placing non-slip mats on floors and in bathtubs.   Putting more light in dark areas.  Contact a doctor if:   Your symptoms get worse.   You have new symptoms.   You keep having symptoms for more than 2 weeks.  Get help right away if:   You have bad headaches, or your headaches get worse.   You have weakness in any part of your body.   You have loss of feeling (numbness).   You feel off  balance.   You keep throwing up (vomiting).   You feel more sleepy.   The black center of one eye (pupil) is bigger than the other one.   You twitch or shake violently (convulse) or have a seizure.   Your speech is not clear (is slurred).   You feel more tired, more confused, or more annoyed.   You do not recognize people or places.   You have neck pain.   It is hard to wake you up.   You have strange behavior changes.   You pass out (lose consciousness).  Summary   A concussion is a brain injury from a direct hit (blow) to the head or body.   This condition is treated with rest and careful watching of symptoms.   If you keep having symptoms for more than 2 weeks, call your doctor.  This information is not intended to replace advice given to you by your health care provider. Make sure you discuss any questions you have with your health care provider.  Document Released: 12/25/2008 Document Revised: 12/22/2015 Document Reviewed: 12/22/2015  Elsevier Interactive Patient Education  2017 [employer]

## 2016-04-26 NOTE — Progress Notes (Signed)
FACULTY PRACTICE ANTEPARTUM(COMPREHENSIVE) NOTE  Jacqueline Clarke is a 31 y.o. [id number] at [redacted]w[redacted]d  who is admitted for headache post concussion.   Fetal presentation is cephalic. Length of Stay:  2  Days  Subjective: Pt notes partial relief of h/a, much improved beginning a few hours after IV solumedrol started. Patient reports the fetal movement as active. Patient reports uterine contraction  activity as none. Patient reports  vaginal bleeding as none. Patient describes fluid per vagina as None.  Vitals:  Blood pressure (!) 95/45, pulse 88, temperature 98.5 F (36.9 C), temperature source Oral, resp. rate 18, height  (1.803 m), weight (!) 141.1 kg (311 lb), last menstrual period 10/20/2015, SpO2 96 %, currently breastfeeding. Physical Examination:  General appearance - alert, well appearing, and in no distress, oriented to person, place, and time and overweight Heart - normal rate and regular rhythm Abdomen - soft, nontender, nondistended Fundal Height:  consistent with twins Cervical Exam: Not evaluated. and found to be / / and fetal presentation is cephalic. Extremities: extremities normal, atraumatic, no cyanosis or edema and Homans sign is negative, no sign of DVT with DTRs 2+ bilaterally Membranes:intact  Fetal Monitoring:  Baseline: 160 bpm, Variability: Good {> 6 bpm), Accelerations: Reactive and Decelerations: Absent  Labs:  Results for orders placed or performed during the hospital encounter of 04/23/16 (from the past 24 hour(s))  Type and screen [facility]   Collection Time: 04/25/16 10:00 AM  Result Value Ref Range   ABO/RH(D) O NEG    Antibody Screen NEG    Sample Expiration 04/28/2016     Imaging Studies:    On monitor now. Currently EPIC will not allow sonographic studies to automatically populate into notes.  In the meantime, copy and paste results into note or free text.  Medications:  Scheduled . amitriptyline  50 mg Oral QHS  .  docusate sodium  100 mg Oral Daily  . indomethacin  50 mg Oral TID WC  . metoCLOPramide (REGLAN) injection  10 mg Intravenous Q6H  . prenatal multivitamin  1 tablet Oral Q1200   I have reviewed the patient's current medications.  ASSESSMENT: Patient Active Problem List   Diagnosis Date Noted  . Post-concussion headache 04/23/2016  . Threatened premature labor, antepartum 04/22/2016  . Encounter for procreative genetic counseling   . Low vitamin D level January 16, 2016  . Anti-D antibodies present during pregnancy 2016/01/16  . Supervision of high-risk pregnancy 12/26/2015  . Monochorionic diamniotic twin gestation 12/19/2015  . Depression 07/09/2015    PLAN: D/c this a.m.  Tilda Burrow 04/26/2016,7:47 AM    Patient ID: Jacqueline Clarke, female   DOB: 12/12/1985, 31 y.o.   MRN: [id number]

## 2016-04-28 NOTE — MAU Note (Signed)
Patient left shoes in MAU and was D/C from [facility] on 04/26/16. Found shoes and will have 1st shift contact her during the day on 04/28/16.

## 2016-05-06 ENCOUNTER — Encounter: Payer: Medicaid Other | Admitting: Certified Nurse Midwife

## 2016-05-06 ENCOUNTER — Other Ambulatory Visit: Payer: Medicaid Other

## 2016-05-09 ENCOUNTER — Ambulatory Visit (HOSPITAL_COMMUNITY)
Admission: RE | Admit: 2016-05-09 | Discharge: 2016-05-09 | Disposition: A | Discharge status: Home / Self Care | Payer: Medicaid Other | Source: Ambulatory Visit | Attending: Certified Nurse Midwife | Admitting: Certified Nurse Midwife

## 2016-05-09 ENCOUNTER — Encounter (HOSPITAL_COMMUNITY): Payer: Self-pay

## 2016-05-09 DIAGNOSIS — Z3A28 28 weeks gestation of pregnancy: Secondary | ICD-10-CM | POA: Diagnosis not present

## 2016-05-09 DIAGNOSIS — O30032 Twin pregnancy, monochorionic/diamniotic, second trimester: Secondary | ICD-10-CM | POA: Insufficient documentation

## 2016-05-09 DIAGNOSIS — O30039 Twin pregnancy, monochorionic/diamniotic, unspecified trimester: Secondary | ICD-10-CM

## 2016-05-09 NOTE — Addendum Note (Signed)
Encounter addended by: Genevie Cheshire, RT on: 05/09/2016 12:48 PM<BR>    Actions taken: Imaging Exam ended

## 2016-05-12 ENCOUNTER — Ambulatory Visit: Payer: Medicaid Other | Attending: Family Medicine | Admitting: Physical Therapy

## 2016-05-12 ENCOUNTER — Encounter: Payer: Self-pay | Admitting: Physical Therapy

## 2016-05-12 DIAGNOSIS — R252 Cramp and spasm: Secondary | ICD-10-CM | POA: Diagnosis present

## 2016-05-12 DIAGNOSIS — M542 Cervicalgia: Secondary | ICD-10-CM | POA: Diagnosis not present

## 2016-05-12 NOTE — Therapy (Signed)
[facility] [address] [place], [place], [address] Phone: [phone]   Fax:  [phone]  Physical Therapy Evaluation  Patient Details  Name: Jacqueline Clarke MRN: [id number] Date of Birth: May 30, 1985 Referring Provider: Oleta Mouse, NP  Encounter Date: 05/12/2016      PT End of Session - 05/12/16 0939    Visit Number 1   Date for PT Re-Evaluation 07/12/16   Authorization Type Medicaid but has a lawyer   PT Start Time 0930   PT Stop Time 1020   PT Time Calculation (min) 50 min   Activity Tolerance Patient tolerated treatment well   Behavior During Therapy [facility] for tasks assessed/performed      Past Medical History:  Diagnosis Date  . Anemia   . Anxiety   . Asthma   . Blood type, Rh negative   . Cholelithiasis   . Chronic back pain    scoliosis- on percocet  . Depression   . Headache(784.0)   . Hyperemesis complicating pregnancy, antepartum 2013  . 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 Obesity   . Pregnancy induced hypertension   . Scoliosis     Past Surgical History:  Procedure Laterality Date  . ADENOIDECTOMY    . ADENOIDECTOMY    . arm surgery    . CHOLECYSTECTOMY    . DILATION AND CURETTAGE OF UTERUS  2008  . MR WRIST RIGHT     sx to nerves  . NERVE, TENDON AND ARTERY REPAIR Right 06/18/2014   Procedure: NERVE, TENDON AND ARTERY REPAIR;  Surgeon: Bradly Bienenstock, MD;  Location: [facility] OR;  Service: Orthopedics;  Laterality: Right;  . TONSILLECTOMY AND ADENOIDECTOMY      There were no vitals filed for this visit.       Subjective Assessment - 05/12/16 0926    Subjective Patient reports that she fell at [employer] on 04/19/16, she reports that she hit her head, she had immediate pain int he neck and the head.  She reports that the pain has improved.  She does describe some pain in the right arm at tines.   Pertinent History Patient is pregnant with twins, due date July 7    Limitations Lifting;House hold activities   Patient Stated Goals have less pain   Currently in Pain? Yes   Pain Score 7    Pain Location Neck   Pain Orientation Mid   Pain Descriptors / Indicators Aching;Tightness   Pain Type Acute pain   Pain Radiating Towards soem pain int he right arm   Pain Onset More than a month ago   Pain Frequency Constant   Aggravating Factors  head motions, lying down and getting up will increase the pain to a 9/10   Pain Relieving Factors heat helps, rest and Tylenol   Effect of Pain on Daily Activities limits ability to do ADL's due to pain            [facility] PT Assessment - 05/12/16 0001      Assessment   Medical Diagnosis neck pain   Referring Provider Oleta Mouse, NP   Onset Date/Surgical Date 04/19/16   Hand Dominance Right   Prior Therapy no     Precautions   Precaution Comments Pregnant [redacted] weeks with twins     Balance Screen   Has the patient fallen in the past 6 months Yes   How many times? 1   Has the patient had  a decrease in activity level because of a fear of falling?  No   Is the patient reluctant to leave their home because of a fear of falling?  No     Home Environment   Additional Comments has 4 kids youngest is 10 months, difficulty lifting the youngest due to neck pain, normall would do some housework     Prior Function   Level of Independence Independent   Vocation Unemployed   Leisure no exercise     Posture/Postural Control   Posture Comments fwd head, rounded shoulders     ROM / Strength   AROM / PROM / Strength AROM;Strength     AROM   Overall AROM Comments Cervical ROM was decreased 50% for all motions with pain and tightness in the neck and the upper traps, Shoulder AROM was WFL's with some pain and tightness in the upper traps and neck area     Strength   Overall Strength Comments 4-/5 with pain     Flexibility   Soft Tissue Assessment /Muscle Length --  some neural tension in the right UE      Palpation   Palpation comment Patient has significant spasms in the upper traps and the cervical area.  she is tender to palpation here as well as into the rhomboids                   [facility] Adult PT Treatment/Exercise - 05/12/16 0001      Modalities   Modalities Moist Heat;Electrical Stimulation     Moist Heat Therapy   Number Minutes Moist Heat 15 Minutes   Moist Heat Location Cervical     Electrical Stimulation   Electrical Stimulation Location cervical area   Electrical Stimulation Action IFC   Electrical Stimulation Parameters sitting   Electrical Stimulation Goals Pain                PT Education - 05/12/16 0939    Education provided Yes   Education Details gentle cervical ROM, shrugs, retractions and gentle upper trap stretches   Person(s) Educated Patient   Methods Explanation;Demonstration;Handout   Comprehension Verbalized understanding          PT Short Term Goals - 05/12/16 0950      PT SHORT TERM GOAL #1   Title independent with intiial HEP   Time 2   Period Weeks   Status New           PT Long Term Goals - 05/12/16 0950      PT LONG TERM GOAL #1   Title decrease pain 50%   Time 8   Period Weeks   Status New     PT LONG TERM GOAL #2   Title increase cervical ROM 50%   Time 8   Period Weeks   Status New     PT LONG TERM GOAL #3   Title understand proper posture and body mechanics   Time 8   Period Weeks   Status New     PT LONG TERM GOAL #4   Title lift her child without pain   Time 8   Period Weeks   Status New               Plan - 05/12/16 0946    Clinical Impression Statement Patient with a slip and fall and hit her head on 04/19/16 at [employer].  She reports that she has had neck pain since that time.  She had significant spasms  in the upper trpa and cervical parapsinals.  She has right UE tightness iwht some c/o pain in the right arm.  She is [redacted] weeks pregnant with twins.  She has a 17 month old at home  and is having difficulty lifting her due to pain in the neck.  Her cervical ROM was decreased 50% for all motions with c/o pain and tightness   Rehab Potential Good   PT Frequency 2x / week   PT Duration 8 weeks   PT Treatment/Interventions ADLs/Self Care Home Management;Cryotherapy;Electrical Stimulation;Ultrasound;Moist Heat;Therapeutic activities;Therapeutic exercise;Patient/family education;Manual techniques   PT Next Visit Plan slowly get her moving   Consulted and Agree with Plan of Care Patient      Patient will benefit from skilled therapeutic intervention in order to improve the following deficits and impairments:  Decreased activity tolerance, Decreased mobility, Decreased strength, Improper body mechanics, Postural dysfunction, Pain, Increased muscle spasms, Decreased range of motion  Visit Diagnosis: Cervicalgia - Plan: PT plan of care cert/re-cert  Cramp and spasm - Plan: PT plan of care cert/re-cert     Problem List Patient Active Problem List   Diagnosis Date Noted  . Post-concussion headache 04/23/2016  . Threatened premature labor, antepartum 04/22/2016  . Encounter for procreative genetic counseling   . Low vitamin D level 02-02-16  . Anti-D antibodies present during pregnancy 02-02-16  . Supervision of high-risk pregnancy 12/26/2015  . Monochorionic diamniotic twin gestation 12/19/2015  . Depression 07/09/2015    Jearld Lesch., PT 05/12/2016, 9:53 AM  [facility] [place] [address] [place], [place], [address] Phone: [phone]   Fax:  [phone]  Name: Jacqueline Clarke MRN: [id number] Date of Birth: Jun 10, 1985

## 2016-05-14 ENCOUNTER — Ambulatory Visit: Payer: Medicaid Other

## 2016-05-15 ENCOUNTER — Ambulatory Visit (INDEPENDENT_AMBULATORY_CARE_PROVIDER_SITE_OTHER): Payer: Medicaid Other | Admitting: Obstetrics and Gynecology

## 2016-05-15 ENCOUNTER — Other Ambulatory Visit: Payer: Medicaid Other

## 2016-05-15 VITALS — BP 118/64 | HR 111 | Temp 98.7°F | Wt 311.0 lb

## 2016-05-15 DIAGNOSIS — O30032 Twin pregnancy, monochorionic/diamniotic, second trimester: Secondary | ICD-10-CM

## 2016-05-15 DIAGNOSIS — F329 Major depressive disorder, single episode, unspecified: Secondary | ICD-10-CM

## 2016-05-15 DIAGNOSIS — O30033 Twin pregnancy, monochorionic/diamniotic, third trimester: Secondary | ICD-10-CM

## 2016-05-15 DIAGNOSIS — O0993 Supervision of high risk pregnancy, unspecified, third trimester: Secondary | ICD-10-CM

## 2016-05-15 DIAGNOSIS — O99343 Other mental disorders complicating pregnancy, third trimester: Secondary | ICD-10-CM

## 2016-05-15 DIAGNOSIS — O99340 Other mental disorders complicating pregnancy, unspecified trimester: Secondary | ICD-10-CM

## 2016-05-15 DIAGNOSIS — O0992 Supervision of high risk pregnancy, unspecified, second trimester: Secondary | ICD-10-CM

## 2016-05-15 MED ORDER — SERTRALINE HCL 50 MG PO TABS: 50.0000 mg | ORAL_TABLET | Freq: Every day | ORAL | 2 refills | Status: DC

## 2016-05-15 MED ORDER — DOXYLAMINE-PYRIDOXINE 10-10 MG PO TBEC: 2.0000 | DELAYED_RELEASE_TABLET | Freq: Every day | ORAL | 1 refills | Status: DC

## 2016-05-15 NOTE — Progress Notes (Signed)
Subjective:  Jacqueline Clarke is a 31 y.o. [id number] at [redacted]w[redacted]d being seen today for ongoing prenatal care.  She is currently monitored for the following issues for this high-risk pregnancy and has Depression; Monochorionic diamniotic twin gestation; Supervision of high-risk pregnancy; Low vitamin D level; Anti-D antibodies present during pregnancy; Encounter for procreative genetic counseling; and Post-concussion headache on her problem list.  Patient reports She is recovering from neck and HA since last visit d/t a fall at [facility]. Has seen PT and HA has for the most part resolved. She is using Procardia PRN for ut ctx. She has had S/Sx of AGE for the last few days. .  Contractions: Irregular. Vag. Bleeding: None.  Movement: Present. Denies leaking of fluid.   The following portions of the patient's history were reviewed and updated as appropriate: allergies, current medications, past family history, past medical history, past social history, past surgical history and problem list. Problem list updated.  Objective:   Vitals:   05/15/16 0849  BP: 118/64  Pulse: (!) 111  Temp: 98.7 F (37.1 C)  Weight: (!) 311 lb (141.1 kg)    Fetal Status: Fetal Heart Rate (bpm): 154/140   Movement: Present     General:  Alert, oriented and cooperative. Patient is in no acute distress.  Skin: Skin is warm and dry. No rash noted.   Cardiovascular: Normal heart rate noted  Respiratory: Normal respiratory effort, no problems with respiration noted  Abdomen: Soft, gravid, appropriate for gestational age. Pain/Pressure: Present     Pelvic:  Cervical exam deferred        Extremities: Normal range of motion.  Edema: None  Mental Status: Normal mood and affect. Normal behavior. Normal judgment and thought content.   Urinalysis:      Assessment and Plan:  Pregnancy: [id number] at [redacted]w[redacted]d  1. Supervision of high risk pregnancy in second trimester Will continue with BASA. BRAT diet Will cancel Glucola d/t AGE  Sx today Reschedule for next week - Doxylamine-Pyridoxine (DICLEGIS) 10-10 MG TBEC; Take 2 tablets by mouth at bedtime. If symptoms persist, add one tablet in the morning and one in the afternoon  Dispense: 30 tablet; Refill: 1  2. Monochorionic diamniotic twin gestation in second trimester Stable. U/S follow already scheduled  3. Depression during pregnancy, antepartum Reviewed with pt. Will start Zoloft R/B reviewed with pt. - sertraline (ZOLOFT) 50 MG tablet; Take 1 tablet (50 mg total) by mouth daily. Take 1/2 tablet qd x 7 days and then increase to 1 whole tablet qd  Dispense: 30 tablet; Refill: 2  Preterm labor symptoms and general obstetric precautions including but not limited to vaginal bleeding, contractions, leaking of fluid and fetal movement were reviewed in detail with the patient. Please refer to After Visit Summary for other counseling recommendations.  Return in about 2 weeks (around 05/29/2016) for OB visit.   Hermina Staggers, MD

## 2016-05-15 NOTE — Patient Instructions (Signed)

## 2016-05-15 NOTE — Progress Notes (Signed)
Patient reports good fetal movement, states that she has been having contractions on and off for the last 3 days. Pt states that she has been throwing up all morning and does not think she can do her 2hr test.

## 2016-05-19 ENCOUNTER — Other Ambulatory Visit: Payer: Self-pay | Admitting: Obstetrics and Gynecology

## 2016-05-21 ENCOUNTER — Encounter (HOSPITAL_COMMUNITY): Payer: Self-pay | Admitting: *Deleted

## 2016-05-21 ENCOUNTER — Inpatient Hospital Stay (HOSPITAL_COMMUNITY)
Admission: AD | Admit: 2016-05-21 | Discharge: 2016-05-21 | Disposition: A | Discharge status: Home / Self Care | Payer: Medicaid Other | Source: Ambulatory Visit | Attending: Obstetrics and Gynecology | Admitting: Obstetrics and Gynecology

## 2016-05-21 ENCOUNTER — Other Ambulatory Visit: Payer: Medicaid Other

## 2016-05-21 DIAGNOSIS — O99343 Other mental disorders complicating pregnancy, third trimester: Secondary | ICD-10-CM | POA: Diagnosis not present

## 2016-05-21 DIAGNOSIS — G8929 Other chronic pain: Secondary | ICD-10-CM | POA: Insufficient documentation

## 2016-05-21 DIAGNOSIS — Z79899 Other long term (current) drug therapy: Secondary | ICD-10-CM | POA: Insufficient documentation

## 2016-05-21 DIAGNOSIS — Z7982 Long term (current) use of aspirin: Secondary | ICD-10-CM | POA: Diagnosis not present

## 2016-05-21 DIAGNOSIS — B9689 Other specified bacterial agents as the cause of diseases classified elsewhere: Secondary | ICD-10-CM

## 2016-05-21 DIAGNOSIS — O99513 Diseases of the respiratory system complicating pregnancy, third trimester: Secondary | ICD-10-CM | POA: Diagnosis not present

## 2016-05-21 DIAGNOSIS — M419 Scoliosis, unspecified: Secondary | ICD-10-CM | POA: Diagnosis not present

## 2016-05-21 DIAGNOSIS — E669 Obesity, unspecified: Secondary | ICD-10-CM | POA: Diagnosis not present

## 2016-05-21 DIAGNOSIS — O99353 Diseases of the nervous system complicating pregnancy, third trimester: Secondary | ICD-10-CM | POA: Insufficient documentation

## 2016-05-21 DIAGNOSIS — O30033 Twin pregnancy, monochorionic/diamniotic, third trimester: Secondary | ICD-10-CM | POA: Diagnosis not present

## 2016-05-21 DIAGNOSIS — Z3A30 30 weeks gestation of pregnancy: Secondary | ICD-10-CM | POA: Diagnosis not present

## 2016-05-21 DIAGNOSIS — Z87891 Personal history of nicotine dependence: Secondary | ICD-10-CM | POA: Diagnosis not present

## 2016-05-21 DIAGNOSIS — O4703 False labor before 37 completed weeks of gestation, third trimester: Secondary | ICD-10-CM | POA: Diagnosis not present

## 2016-05-21 DIAGNOSIS — F329 Major depressive disorder, single episode, unspecified: Secondary | ICD-10-CM | POA: Diagnosis not present

## 2016-05-21 DIAGNOSIS — N760 Acute vaginitis: Secondary | ICD-10-CM | POA: Insufficient documentation

## 2016-05-21 DIAGNOSIS — O99213 Obesity complicating pregnancy, third trimester: Secondary | ICD-10-CM | POA: Diagnosis not present

## 2016-05-21 DIAGNOSIS — R109 Unspecified abdominal pain: Secondary | ICD-10-CM | POA: Diagnosis present

## 2016-05-21 DIAGNOSIS — J45909 Unspecified asthma, uncomplicated: Secondary | ICD-10-CM | POA: Diagnosis not present

## 2016-05-21 DIAGNOSIS — Z3493 Encounter for supervision of normal pregnancy, unspecified, third trimester: Secondary | ICD-10-CM

## 2016-05-21 DIAGNOSIS — O26893 Other specified pregnancy related conditions, third trimester: Secondary | ICD-10-CM | POA: Diagnosis present

## 2016-05-21 LAB — URINALYSIS, ROUTINE W REFLEX MICROSCOPIC
BILIRUBIN URINE: NEGATIVE
Glucose, UA: NEGATIVE mg/dL
Ketones, ur: NEGATIVE mg/dL
Nitrite: NEGATIVE
PH: 5 (ref 5.0–8.0)
Protein, ur: 30 mg/dL — AB
SPECIFIC GRAVITY, URINE: 1.027 (ref 1.005–1.030)

## 2016-05-21 LAB — WET PREP, GENITAL
SPERM: NONE SEEN
TRICH WET PREP: NONE SEEN
YEAST WET PREP: NONE SEEN

## 2016-05-21 LAB — FETAL FIBRONECTIN: Fetal Fibronectin: NEGATIVE

## 2016-05-21 LAB — POCT FERN TEST: POCT Fern Test: NEGATIVE

## 2016-05-21 MED ORDER — METRONIDAZOLE 500 MG PO TABS: 500.0000 mg | ORAL_TABLET | Freq: Two times a day (BID) | ORAL | 0 refills | Status: DC

## 2016-05-21 MED ORDER — LACTATED RINGERS IV BOLUS (SEPSIS)
1000.0000 mL | Freq: Once | INTRAVENOUS | Status: AC
  Administered 2016-05-21: 1000 mL via INTRAVENOUS

## 2016-05-21 NOTE — MAU Note (Signed)
PT SAYS SHE THINKS  SHE IS LEAKING  FLUID -  AT 1045PM.    PNC  WITH  FAMINA .   LAST SEX-  SUN

## 2016-05-21 NOTE — MAU Provider Note (Signed)
History     CSN: [id number]  Arrival date and time: 05/21/16 0028  First Provider Initiated Contact with Patient 05/21/16 0114      Chief Complaint  Patient presents with  . Abdominal Pain  . Vaginal Discharge  . Decreased Fetal Movement   HPI Jacqueline Clarke is a 31 y.o. [id number] at [redacted]w[redacted]d with mono/di twins who presents with pelvic pressure, lower abdominal cramping, & vaginal discharge. Symptoms began this morning. Reports feeling intermittent cramping & pressure every 10-15 minutes. Took dose of procardia at 8 pm; was previously prescribed procardia but had not been taking it. Denies pain with cramping; states she just feels the abdominal tightening.   When she got up at 1045 pm she found her underwear to be wet & felt a gush of fluid. Leaking has not continued. States it was watery fluid without odor. Denies n/v/d, constipation, dysuria, vaginal bleeding.  Decreased fetal movement of baby B this evening.   OB History    Gravida Para Term Preterm AB Living   0 2 4   SAB TAB Ectopic Multiple Live Births   2 0 0   4      Past Medical History:  Diagnosis Date  . Anemia   . Anxiety   . Asthma   . Blood type, Rh negative   . Cholelithiasis   . Chronic back pain    scoliosis- on percocet  . Depression   . Headache(784.0)   . Hyperemesis complicating pregnancy, antepartum 2013  . 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 Obesity   . Pregnancy induced hypertension   . Scoliosis     Past Surgical History:  Procedure Laterality Date  . ADENOIDECTOMY    . ADENOIDECTOMY    . arm surgery    . CHOLECYSTECTOMY    . DILATION AND CURETTAGE OF UTERUS  2008  . MR WRIST RIGHT     sx to nerves  . NERVE, TENDON AND ARTERY REPAIR Right 06/18/2014   Procedure: NERVE, TENDON AND ARTERY REPAIR;  Surgeon: Bradly Bienenstock, MD;  Location: [facility] OR;  Service: Orthopedics;  Laterality: Right;  . TONSILLECTOMY AND ADENOIDECTOMY      Family History  Problem Relation Age of  Onset  . Heart disease Maternal Grandfather   . Diabetes Maternal Grandfather   . Heart disease Father   . Anesthesia problems Neg Hx     Social History  Substance Use Topics  . Smoking status: Former Smoker    Packs/day: 0.50    Types: Cigarettes  . Smokeless tobacco: Never Used  . Alcohol use No     Comment: Occassionally    Allergies: No Known Allergies  Prescriptions Prior to Admission  Medication Sig Dispense Refill Last Dose  . acetaminophen (TYLENOL) 325 MG tablet Take 650 mg by mouth every 6 (six) hours as needed for moderate pain.   05/20/2016 at Unknown time  . aspirin EC 81 MG tablet Take 1 tablet (81 mg total) by mouth daily. Take after 12 weeks for prevention of preeclampsia later in pregnancy 300 tablet 2 05/20/2016 at Unknown time  . cyclobenzaprine (FLEXERIL) 10 MG tablet Take 1 tablet by mouth 3 (three) times daily as needed for muscle spasms.   3 Past Month at Unknown time  . Doxylamine-Pyridoxine (DICLEGIS) 10-10 MG TBEC Take 2 tablets by mouth at bedtime. If symptoms persist, add one tablet in the morning and one in the afternoon 30 tablet 1 05/20/2016 at Unknown  time  . sertraline (ZOLOFT) 50 MG tablet Take 1 tablet (50 mg total) by mouth daily. Take 1/2 tablet qd x 7 days and then increase to 1 whole tablet qd 30 tablet 2 Past Week at Unknown time  . NIFEdipine (PROCARDIA) 10 MG capsule Take 1 capsule (10 mg total) by mouth every 6 (six) hours as needed (contractions). For more than 5 contractions per hour 30 capsule 0 05/20/2016 at 0600    Review of Systems  Constitutional: Negative.   Gastrointestinal: Negative for constipation, diarrhea, nausea and vomiting.       + abdominal pressure & tightening, states not pain  Genitourinary: Positive for vaginal discharge. Negative for dysuria and vaginal bleeding.  Musculoskeletal: Positive for back pain.   Physical Exam   Blood pressure 124/81, pulse 96, temperature 98.6 F (37 C), temperature source Oral, resp. rate 20,  last menstrual period 10/20/2015, currently breastfeeding.  Physical Exam  Nursing note and vitals reviewed. Constitutional: She is oriented to person, place, and time. She appears well-developed and well-nourished. No distress.  HENT:  Head: Normocephalic and atraumatic.  Eyes: Conjunctivae are normal. Right eye exhibits no discharge. Left eye exhibits no discharge. No scleral icterus.  Neck: Normal range of motion.  Cardiovascular: Normal rate, regular rhythm and normal heart sounds.   No murmur heard. Respiratory: Effort normal and breath sounds normal. No respiratory distress. She has no wheezes.  GI: Soft. Bowel sounds are normal. There is no tenderness.  Genitourinary: No bleeding in the vagina. Vaginal discharge (small amount of thin gray discharge; no pooling) found.  Neurological: She is alert and oriented to person, place, and time.  Skin: Skin is warm and dry. She is not diaphoretic.  Psychiatric: She has a normal mood and affect. Her behavior is normal. Judgment and thought content normal.   Dilation: 1 Effacement (%): 50 Station: -3, Ballotable Presentation: Vertex Exam by:: [name] NP   Fetal Tracing: Baby A Baseline: 135 Variability: moderate Accelerations: 15x15 Decelerations: none  Baby B Baseline: 140 Variability: moderate Accelerations: 15x15 Decelerations: none  Toco: irr ctx & UI MAU Course  Procedures Results for orders placed or performed during the hospital encounter of 05/21/16 (from the past 24 hour(s))  Urinalysis, Routine w reflex microscopic     Status: Abnormal   Collection Time: 05/21/16 12:41 AM  Result Value Ref Range   Color, Urine AMBER (A) YELLOW   APPearance CLOUDY (A) CLEAR   Specific Gravity, Urine 1.027 1.005 - 1.030   pH 5.0 5.0 - 8.0   Glucose, UA NEGATIVE NEGATIVE mg/dL   Hgb urine dipstick SMALL (A) NEGATIVE   Bilirubin Urine NEGATIVE NEGATIVE   Ketones, ur NEGATIVE NEGATIVE mg/dL   Protein, ur 30 (A) NEGATIVE mg/dL    Nitrite NEGATIVE NEGATIVE   Leukocytes, UA LARGE (A) NEGATIVE   RBC / HPF 6-30 0 - 5 RBC/hpf   WBC, UA TOO NUMEROUS TO COUNT 0 - 5 WBC/hpf   Bacteria, UA RARE (A) NONE SEEN   Squamous Epithelial / LPF TOO NUMEROUS TO COUNT (A) NONE SEEN   Mucous PRESENT   Fetal fibronectin     Status: None   Collection Time: 05/21/16  1:53 AM  Result Value Ref Range   Fetal Fibronectin NEGATIVE NEGATIVE  Wet prep, genital     Status: Abnormal   Collection Time: 05/21/16  1:54 AM  Result Value Ref Range   Yeast Wet Prep HPF POC NONE SEEN NONE SEEN   Trich, Wet Prep NONE SEEN NONE SEEN  Clue Cells Wet Prep HPF POC PRESENT (A) NONE SEEN   WBC, Wet Prep HPF POC MANY (A) NONE SEEN   Sperm NONE SEEN   POCT fern test     Status: None   Collection Time: 05/21/16  1:55 AM  Result Value Ref Range   POCT Fern Test Negative = intact amniotic membranes     MDM Reactive fetal tracing of twins Patient reports return of normal movement since being placed on monitor No pooling, fern negative Cervix unchanged while in MAU, ctx improved with IV fluids, FFN negative Patient reports improvement in symptoms Given BMZ 4/3 & 4/5  Assessment and Plan  A: 1. Preterm uterine contractions, antepartum, third trimester   2. BV (bacterial vaginosis)   3. Monochorionic diamniotic twin gestation in third trimester    P: Discharge home Rx flagyl Continue to take procardia as previously prescribed -- if symptoms continue with procardia call ob, if symptoms worsen return to MAU Discussed reasons to return to MAU Keep follow up appointment with OB/PCP   [name] 05/21/2016, 1:13 AM

## 2016-05-21 NOTE — Discharge Instructions (Signed)
. °Preterm Labor and Birth Information °The normal length of a pregnancy is 39-41 weeks. Preterm labor is when labor starts before 37 completed weeks of pregnancy. °What are the risk factors for preterm labor? °Preterm labor is more likely to occur in women who: °· Have certain infections during pregnancy such as a bladder infection, sexually transmitted infection, or infection inside the uterus (chorioamnionitis). °· Have a shorter-than-normal cervix. °· Have gone into preterm labor before. °· Have had surgery on their cervix. °· Are younger than age 17 or older than age 35. °· Are African American. °· Are pregnant with twins or multiple babies (multiple gestation). °· Take street drugs or smoke while pregnant. °· Do not gain enough weight while pregnant. °· Became pregnant shortly after having been pregnant. °What are the symptoms of preterm labor? °Symptoms of preterm labor include: °· Cramps similar to those that can happen during a menstrual period. The cramps may happen with diarrhea. °· Pain in the abdomen or lower back. °· Regular uterine contractions that may feel like tightening of the abdomen. °· A feeling of increased pressure in the pelvis. °· Increased watery or bloody mucus discharge from the vagina. °· Water breaking (ruptured amniotic sac). °Why is it important to recognize signs of preterm labor? °It is important to recognize signs of preterm labor because babies who are born prematurely may not be fully developed. This can put them at an increased risk for: °· Long-term (chronic) heart and lung problems. °· Difficulty immediately after birth with regulating body systems, including blood sugar, body temperature, heart rate, and breathing rate. °· Bleeding in the brain. °· Cerebral palsy. °· Learning difficulties. °· Death. °These risks are highest for babies who are born before 34 weeks of pregnancy. °How is preterm labor treated? °Treatment depends on the length of your pregnancy, your condition,  and the health of your baby. It may involve: °· Having a stitch (suture) placed in your cervix to prevent your cervix from opening too early (cerclage). °· Taking or being given medicines, such as: °¨ Hormone medicines. These may be given early in pregnancy to help support the pregnancy. °¨ Medicine to stop contractions. °¨ Medicines to help mature the baby’s lungs. These may be prescribed if the risk of delivery is high. °¨ Medicines to prevent your baby from developing cerebral palsy. °If the labor happens before 34 weeks of pregnancy, you may need to stay in the hospital. °What should I do if I think I am in preterm labor? °If you think that you are going into preterm labor, call your health care provider right away. °How can I prevent preterm labor in future pregnancies? °To increase your chance of having a full-term pregnancy: °· Do not use any tobacco products, such as cigarettes, chewing tobacco, and e-cigarettes. If you need help quitting, ask your health care provider. °· Do not use street drugs or medicines that have not been prescribed to you during your pregnancy. °· Talk with your health care provider before taking any herbal supplements, even if you have been taking them regularly. °· Make sure you gain a healthy amount of weight during your pregnancy. °· Watch for infection. If you think that you might have an infection, get it checked right away. °· Make sure to tell your health care provider if you have gone into preterm labor before. °This information is not intended to replace advice given to you by your health care provider. Make sure you discuss any questions you have with your   care provider. °Document Released: 03/29/2003 Document Revised: 06/19/2015 Document Reviewed: 05/30/2015 °Elsevier Interactive Patient Education © 2017 [employer] ° ° ° °Bacterial Vaginosis °Bacterial vaginosis is a vaginal infection that occurs when the normal balance of bacteria in the vagina is disrupted. It  results from an overgrowth of certain bacteria. This is the most common vaginal infection among women ages 15-44. °Because bacterial vaginosis increases your risk for STIs (sexually transmitted infections), getting treated can help reduce your risk for chlamydia, gonorrhea, herpes, and HIV (human immunodeficiency virus). Treatment is also important for preventing complications in pregnant women, because this condition can cause an early (premature) delivery. °What are the causes? °This condition is caused by an increase in harmful bacteria that are normally present in small amounts in the vagina. However, the reason that the condition develops is not fully understood. °What increases the risk? °The following factors may make you more likely to develop this condition: °· Having a new sexual partner or multiple sexual partners. °· Having unprotected sex. °· Douching. °· Having an intrauterine device (IUD). °· Smoking. °· Drug and alcohol abuse. °· Taking certain antibiotic medicines. °· Being pregnant. °You cannot get bacterial vaginosis from toilet seats, bedding, swimming pools, or contact with objects around you. °What are the signs or symptoms? °Symptoms of this condition include: °· Grey or white vaginal discharge. The discharge can also be watery or foamy. °· A fish-like odor with discharge, especially after sexual intercourse or during menstruation. °· Itching in and around the vagina. °· Burning or pain with urination. °Some women with bacterial vaginosis have no signs or symptoms. °How is this diagnosed? °This condition is diagnosed based on: °· Your medical history. °· A physical exam of the vagina. °· Testing a sample of vaginal fluid under a microscope to look for a large amount of bad bacteria or abnormal cells. Your health care provider may use a cotton swab or a small wooden spatula to collect the sample. °How is this treated? °This condition is treated with antibiotics. These may be given as a pill, a  vaginal cream, or a medicine that is put into the vagina (suppository). If the condition comes back after treatment, a second round of antibiotics may be needed. °Follow these instructions at home: °Medicines  °· Take over-the-counter and prescription medicines only as told by your health care provider. °· Take or use your antibiotic as told by your health care provider. Do not stop taking or using the antibiotic even if you start to feel better. °General instructions  °· If you have a female sexual partner, tell her that you have a vaginal infection. She should see her health care provider and be treated if she has symptoms. If you have a female sexual partner, he does not need treatment. °· During treatment: °¨ Avoid sexual activity until you finish treatment. °¨ Do not douche. °¨ Avoid alcohol as directed by your health care provider. °¨ Avoid breastfeeding as directed by your health care provider. °· Drink enough water and fluids to keep your urine clear or pale yellow. °· Keep the area around your vagina and rectum clean. °¨ Wash the area daily with warm water. °¨ Wipe yourself from front to back after using the toilet. °· Keep all follow-up visits as told by your health care provider. This is important. °How is this prevented? °· Do not douche. °· Wash the outside of your vagina with warm water only. °· Use protection when having sex. This includes latex condoms and dental   dams. °· Limit how many sexual partners you have. To help prevent bacterial vaginosis, it is best to have sex with just one partner (monogamous). °· Make sure you and your sexual partner are tested for STIs. °· Wear cotton or cotton-lined underwear. °· Avoid wearing tight pants and pantyhose, especially during summer. °· Limit the amount of alcohol that you drink. °· Do not use any products that contain nicotine or tobacco, such as cigarettes and e-cigarettes. If you need help quitting, ask your health care provider. °· Do not use illegal  drugs. °Where to find more information: °· Centers for Disease Control and Prevention: www.cdc.gov/std °· American Sexual Health Association (ASHA): www.ashastd.org °· [place] Department of Health and Human Services, Office on Women's Health: www.womenshealth.gov/ or https://www.womenshealth.gov/a-z-topics/bacterial-vaginosis °Contact a health care provider if: °· Your symptoms do not improve, even after treatment. °· You have more discharge or pain when urinating. °· You have a fever. °· You have pain in your abdomen. °· You have pain during sex. °· You have vaginal bleeding between periods. °Summary °· Bacterial vaginosis is a vaginal infection that occurs when the normal balance of bacteria in the vagina is disrupted. °· Because bacterial vaginosis increases your risk for STIs (sexually transmitted infections), getting treated can help reduce your risk for chlamydia, gonorrhea, herpes, and HIV (human immunodeficiency virus). Treatment is also important for preventing complications in pregnant women, because the condition can cause an early (premature) delivery. °· This condition is treated with antibiotic medicines. These may be given as a pill, a vaginal cream, or a medicine that is put into the vagina (suppository). °This information is not intended to replace advice given to you by your health care provider. Make sure you discuss any questions you have with your health care provider. °Document Released: 01/06/2005 Document Revised: 09/22/2015 Document Reviewed: 09/22/2015 °Elsevier Interactive Patient Education © 2017 [employer] ° °

## 2016-05-22 ENCOUNTER — Other Ambulatory Visit: Payer: Self-pay | Admitting: Certified Nurse Midwife

## 2016-05-22 DIAGNOSIS — O99013 Anemia complicating pregnancy, third trimester: Secondary | ICD-10-CM

## 2016-05-22 DIAGNOSIS — O0993 Supervision of high risk pregnancy, unspecified, third trimester: Secondary | ICD-10-CM

## 2016-05-22 LAB — GLUCOSE TOLERANCE, 2 HOURS W/ 1HR
Glucose, 1 hour: 142 mg/dL (ref 65–179)
Glucose, 2 hour: 87 mg/dL (ref 65–152)
Glucose, Fasting: 83 mg/dL (ref 65–91)

## 2016-05-22 LAB — CBC
HEMOGLOBIN: 9.2 g/dL — AB (ref 11.1–15.9)
Hematocrit: 29.2 % — ABNORMAL LOW (ref 34.0–46.6)
MCH: 25.8 pg — AB (ref 26.6–33.0)
MCHC: 31.5 g/dL (ref 31.5–35.7)
MCV: 82 fL (ref 79–97)
Platelets: 229 10*3/uL (ref 150–379)
RBC: 3.56 x10E6/uL — ABNORMAL LOW (ref 3.77–5.28)
RDW: 15.9 % — ABNORMAL HIGH (ref 12.3–15.4)
WBC: 7.2 10*3/uL (ref 3.4–10.8)

## 2016-05-22 LAB — RPR: RPR Ser Ql: NONREACTIVE

## 2016-05-22 LAB — HIV ANTIBODY (ROUTINE TESTING W REFLEX): HIV Screen 4th Generation wRfx: NONREACTIVE

## 2016-05-22 MED ORDER — CITRANATAL BLOOM 90-1 MG PO TABS: 1.0000 | ORAL_TABLET | Freq: Every day | ORAL | 12 refills | Status: DC

## 2016-05-23 ENCOUNTER — Encounter (HOSPITAL_COMMUNITY): Payer: Self-pay

## 2016-05-23 ENCOUNTER — Other Ambulatory Visit (HOSPITAL_COMMUNITY): Payer: Self-pay | Admitting: Maternal and Fetal Medicine

## 2016-05-23 ENCOUNTER — Ambulatory Visit (HOSPITAL_COMMUNITY)
Admission: RE | Admit: 2016-05-23 | Discharge: 2016-05-23 | Disposition: A | Discharge status: Home / Self Care | Payer: Medicaid Other | Source: Ambulatory Visit | Attending: Certified Nurse Midwife | Admitting: Certified Nurse Midwife

## 2016-05-23 DIAGNOSIS — Z6841 Body Mass Index (BMI) 40.0 and over, adult: Secondary | ICD-10-CM | POA: Insufficient documentation

## 2016-05-23 DIAGNOSIS — O30033 Twin pregnancy, monochorionic/diamniotic, third trimester: Secondary | ICD-10-CM

## 2016-05-23 DIAGNOSIS — Z3A30 30 weeks gestation of pregnancy: Secondary | ICD-10-CM

## 2016-05-23 DIAGNOSIS — O09893 Supervision of other high risk pregnancies, third trimester: Secondary | ICD-10-CM | POA: Diagnosis not present

## 2016-05-23 DIAGNOSIS — O99213 Obesity complicating pregnancy, third trimester: Secondary | ICD-10-CM

## 2016-05-23 DIAGNOSIS — O30039 Twin pregnancy, monochorionic/diamniotic, unspecified trimester: Secondary | ICD-10-CM

## 2016-05-23 DIAGNOSIS — O99210 Obesity complicating pregnancy, unspecified trimester: Secondary | ICD-10-CM | POA: Diagnosis not present

## 2016-05-26 ENCOUNTER — Ambulatory Visit: Payer: No Typology Code available for payment source | Attending: Family Medicine | Admitting: Physical Therapy

## 2016-06-02 ENCOUNTER — Encounter (HOSPITAL_COMMUNITY): Payer: Self-pay

## 2016-06-02 ENCOUNTER — Ambulatory Visit (INDEPENDENT_AMBULATORY_CARE_PROVIDER_SITE_OTHER): Payer: Medicaid Other | Admitting: Obstetrics and Gynecology

## 2016-06-02 ENCOUNTER — Observation Stay (HOSPITAL_COMMUNITY)
Admission: AD | Admit: 2016-06-02 | Discharge: 2016-06-03 | Disposition: A | Discharge status: Home / Self Care | Payer: Medicaid Other | Source: Ambulatory Visit | Attending: Obstetrics and Gynecology | Admitting: Obstetrics and Gynecology

## 2016-06-02 VITALS — BP 131/94 | HR 105 | Wt 328.4 lb

## 2016-06-02 DIAGNOSIS — Z87891 Personal history of nicotine dependence: Secondary | ICD-10-CM | POA: Insufficient documentation

## 2016-06-02 DIAGNOSIS — O30033 Twin pregnancy, monochorionic/diamniotic, third trimester: Secondary | ICD-10-CM | POA: Diagnosis not present

## 2016-06-02 DIAGNOSIS — O0993 Supervision of high risk pregnancy, unspecified, third trimester: Secondary | ICD-10-CM

## 2016-06-02 DIAGNOSIS — J45909 Unspecified asthma, uncomplicated: Secondary | ICD-10-CM | POA: Diagnosis not present

## 2016-06-02 DIAGNOSIS — Z7982 Long term (current) use of aspirin: Secondary | ICD-10-CM | POA: Diagnosis not present

## 2016-06-02 DIAGNOSIS — O09893 Supervision of other high risk pregnancies, third trimester: Secondary | ICD-10-CM | POA: Diagnosis present

## 2016-06-02 DIAGNOSIS — O99013 Anemia complicating pregnancy, third trimester: Secondary | ICD-10-CM | POA: Insufficient documentation

## 2016-06-02 DIAGNOSIS — Z3A32 32 weeks gestation of pregnancy: Secondary | ICD-10-CM | POA: Diagnosis not present

## 2016-06-02 DIAGNOSIS — D649 Anemia, unspecified: Secondary | ICD-10-CM | POA: Diagnosis present

## 2016-06-02 LAB — COMPREHENSIVE METABOLIC PANEL
ALBUMIN: 2.3 g/dL — AB (ref 3.5–5.0)
ALT: 6 U/L — ABNORMAL LOW (ref 14–54)
AST: 15 U/L (ref 15–41)
Alkaline Phosphatase: 85 U/L (ref 38–126)
Anion gap: 7 (ref 5–15)
BUN: 8 mg/dL (ref 6–20)
CO2: 20 mmol/L — AB (ref 22–32)
CREATININE: 0.61 mg/dL (ref 0.44–1.00)
Calcium: 8.8 mg/dL — ABNORMAL LOW (ref 8.9–10.3)
Chloride: 108 mmol/L (ref 101–111)
GFR calc Af Amer: >60 mL/min (ref >60)
GLUCOSE: 103 mg/dL — AB (ref 65–99)
Potassium: 3.6 mmol/L (ref 3.5–5.1)
Sodium: 135 mmol/L (ref 135–145)
TOTAL PROTEIN: 6.5 g/dL (ref 6.5–8.1)
Total Bilirubin: 0.2 mg/dL — ABNORMAL LOW (ref 0.3–1.2)

## 2016-06-02 LAB — PROTEIN / CREATININE RATIO, URINE
Creatinine, Urine: 177 mg/dL
PROTEIN CREATININE RATIO: 0.1 mg/mg{creat} (ref 0.00–0.15)
TOTAL PROTEIN, URINE: 18 mg/dL

## 2016-06-02 LAB — URINALYSIS, ROUTINE W REFLEX MICROSCOPIC
BILIRUBIN URINE: NEGATIVE
Glucose, UA: NEGATIVE mg/dL
Ketones, ur: NEGATIVE mg/dL
NITRITE: NEGATIVE
PH: 6 (ref 5.0–8.0)
Protein, ur: NEGATIVE mg/dL
SPECIFIC GRAVITY, URINE: 1.018 (ref 1.005–1.030)

## 2016-06-02 LAB — CBC
HCT: 26.6 % — ABNORMAL LOW (ref 36.0–46.0)
Hemoglobin: 8.6 g/dL — ABNORMAL LOW (ref 12.0–15.0)
MCH: 26 pg (ref 26.0–34.0)
MCHC: 32.3 g/dL (ref 30.0–36.0)
MCV: 80.4 fL (ref 78.0–100.0)
PLATELETS: 185 10*3/uL (ref 150–400)
RBC: 3.31 MIL/uL — ABNORMAL LOW (ref 3.87–5.11)
RDW: 15.5 % (ref 11.5–15.5)
WBC: 6.9 10*3/uL (ref 4.0–10.5)

## 2016-06-02 LAB — PREPARE RBC (CROSSMATCH)

## 2016-06-02 MED ORDER — BUTALBITAL-APAP-CAFFEINE 50-325-40 MG PO TABS
1.0000 | ORAL_TABLET | Freq: Once | ORAL | Status: AC
  Administered 2016-06-02: 1 via ORAL
  Filled 2016-06-02: qty 1

## 2016-06-02 MED ORDER — CALCIUM CARBONATE ANTACID 500 MG PO CHEW: 2.0000 | CHEWABLE_TABLET | ORAL | Status: DC | PRN

## 2016-06-02 MED ORDER — METRONIDAZOLE 500 MG PO TABS
500.0000 mg | ORAL_TABLET | Freq: Two times a day (BID) | ORAL | Status: DC
  Administered 2016-06-02 – 2016-06-03 (×2): 500 mg via ORAL
  Filled 2016-06-02 (×2): qty 1

## 2016-06-02 MED ORDER — SODIUM CHLORIDE 0.9 % IV SOLN: Freq: Once | INTRAVENOUS | Status: DC

## 2016-06-02 MED ORDER — DOCUSATE SODIUM 100 MG PO CAPS
100.0000 mg | ORAL_CAPSULE | Freq: Every day | ORAL | Status: DC
  Administered 2016-06-03: 100 mg via ORAL
  Filled 2016-06-02: qty 1

## 2016-06-02 MED ORDER — ASPIRIN EC 81 MG PO TBEC
81.0000 mg | DELAYED_RELEASE_TABLET | Freq: Every day | ORAL | Status: DC
  Administered 2016-06-03: 81 mg via ORAL
  Filled 2016-06-02 (×2): qty 1

## 2016-06-02 MED ORDER — FUROSEMIDE 10 MG/ML IJ SOLN
20.0000 mg | Freq: Once | INTRAMUSCULAR | Status: AC
  Administered 2016-06-02: 20 mg via INTRAVENOUS
  Filled 2016-06-02: qty 2

## 2016-06-02 MED ORDER — PRENATAL MULTIVITAMIN CH
1.0000 | ORAL_TABLET | Freq: Every day | ORAL | Status: DC
  Administered 2016-06-03: 1 via ORAL
  Filled 2016-06-02: qty 1

## 2016-06-02 MED ORDER — ZOLPIDEM TARTRATE 5 MG PO TABS: 5.0000 mg | ORAL_TABLET | Freq: Every evening | ORAL | Status: DC | PRN

## 2016-06-02 MED ORDER — SERTRALINE HCL 50 MG PO TABS
50.0000 mg | ORAL_TABLET | Freq: Every day | ORAL | Status: DC
  Administered 2016-06-03: 50 mg via ORAL
  Filled 2016-06-02 (×3): qty 1

## 2016-06-02 MED ORDER — FUROSEMIDE 10 MG/ML IJ SOLN
20.0000 mg | Freq: Once | INTRAMUSCULAR | Status: AC
  Administered 2016-06-03: 20 mg via INTRAVENOUS
  Filled 2016-06-02: qty 2

## 2016-06-02 MED ORDER — ACETAMINOPHEN 325 MG PO TABS
650.0000 mg | ORAL_TABLET | ORAL | Status: DC | PRN
Start: 2016-06-02 — End: 2016-06-04
  Filled 2016-06-02: qty 2

## 2016-06-02 NOTE — Progress Notes (Signed)
Patient has been feeling a lot of pressure- she also states she has been feeling contractions today. The Procardia is not helping. Patient states her swelling is worse it is starting in her hands.

## 2016-06-02 NOTE — H&P (Signed)
ANTEPARTUM ADMISSION HISTORY AND PHYSICAL NOTE   History of Present Illness: Jacqueline Clarke is a 31 y.o. [id number] at [redacted]w[redacted]d with mono/di twins admitted for symptomatic anemia and concern for pre-eclampsia. She has gHTN that was diagnosed today.  She reports that in the past 48 hours or so her swelling has gotten significantly worse and she has had significant headache that did not improve with tylenol at home. She also reports some light headedness and dizziness. That has not improved. She has no complaints of RUQ pain.   Patient reports the fetal movement as active. Patient reports uterine contraction  activity as irregular, every 15 minutes. Patient reports  vaginal bleeding as none. Patient describes fluid per vagina as None. Fetal presentation is cephalic.  Patient Active Problem List   Diagnosis Date Noted  . Symptomatic anemia 06/02/2016  . Anemia affecting pregnancy in third trimester 05/22/2016  . Post-concussion headache 04/23/2016  . Encounter for procreative genetic counseling   . Low vitamin D level 01/12/2016  . 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 Supervision of high-risk pregnancy 12/26/2015  . Monochorionic diamniotic twin gestation 12/19/2015  . Depression 07/09/2015    Past Medical History:  Diagnosis Date  . Anemia   . Anxiety   . Asthma   . Blood type, Rh negative   . Cholelithiasis   . Chronic back pain    scoliosis- on percocet  . Depression   . Headache(784.0)   . Hyperemesis complicating pregnancy, antepartum 2013  . 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 Obesity   . Pregnancy induced hypertension   . Scoliosis     Past Surgical History:  Procedure Laterality Date  . ADENOIDECTOMY    . ADENOIDECTOMY    . arm surgery    . CHOLECYSTECTOMY    . DILATION AND CURETTAGE OF UTERUS  2008  . MR WRIST RIGHT     sx to nerves  . NERVE, TENDON AND ARTERY REPAIR Right 06/18/2014   Procedure: NERVE, TENDON AND ARTERY  REPAIR;  Surgeon: Bradly Bienenstock, MD;  Location: [facility] OR;  Service: Orthopedics;  Laterality: Right;  . TONSILLECTOMY AND ADENOIDECTOMY      OB History  Gravida Para Term Preterm AB Living  7 4 4  0 2 4  SAB TAB Ectopic Multiple Live Births  2 0 0   4    # Outcome Date GA Lbr Len/2nd Weight Sex Delivery Anes PTL Lv  7 Current           6 Term 06/22/15 108w1d 07:35 / 00:18 9 lb 1.7 oz (4.13 kg) F Vag-Spont EPI  LIV  5 Term 07/22/11 [redacted]w[redacted]d 03:24 / 00:15 8 lb 15.4 oz (4.065 kg) F Vag-Spont EPI  LIV     Birth Comments: WNL  4 Term 2011 [redacted]w[redacted]d 04:00 6 lb 14 oz (3.118 kg) M Vag-Spont EPI  LIV  3 Term 2010 [redacted]w[redacted]d  7 lb 6 oz (3.345 kg) M Vag-Spont EPI  LIV  2 SAB 2009          1 SAB 2006              Social History   Social History  . Marital status: Single    Spouse name: N/A  . Number of children: N/A  . Years of education: N/A   Social History Main Topics  . Smoking status: Former Smoker    Packs/day: 0.50    Types: Cigarettes  . Smokeless tobacco: Never Used  .  Alcohol use No     Comment: Occassionally  . Drug use: No  . Sexual activity: Yes    Birth control/ protection: None     Comment: nexplanon being removed 08-21-14   Other Topics Concern  . None   Social History Narrative   ** Merged History Encounter **       ** Merged History Encounter **        Family History  Problem Relation Age of Onset  . Heart disease Maternal Grandfather   . Diabetes Maternal Grandfather   . Heart disease Father   . Anesthesia problems Neg Hx     No Known Allergies  Prescriptions Prior to Admission  Medication Sig Dispense Refill Last Dose  . acetaminophen (TYLENOL) 325 MG tablet Take 650 mg by mouth every 6 (six) hours as needed for moderate pain.   06/02/2016  . aspirin EC 81 MG tablet Take 1 tablet (81 mg total) by mouth daily. Take after 12 weeks for prevention of preeclampsia later in pregnancy 300 tablet 2 06/02/2016  . cyclobenzaprine (FLEXERIL) 10 MG tablet Take 1 tablet by  mouth 3 (three) times daily as needed for muscle spasms.   3 06/02/2016  . Doxylamine-Pyridoxine (DICLEGIS) 10-10 MG TBEC Take 2 tablets by mouth at bedtime. If symptoms persist, add one tablet in the morning and one in the afternoon 30 tablet 1 06/02/2016  . metroNIDAZOLE (FLAGYL) 500 MG tablet Take 1 tablet (500 mg total) by mouth 2 (two) times daily. 14 tablet 0 06/02/2016  . NIFEdipine (PROCARDIA) 10 MG capsule Take 1 capsule (10 mg total) by mouth every 6 (six) hours as needed (contractions). For more than 5 contractions per hour 30 capsule 0 06/02/2016  . sertraline (ZOLOFT) 50 MG tablet Take 1 tablet (50 mg total) by mouth daily. Take 1/2 tablet qd x 7 days and then increase to 1 whole tablet qd (Patient taking differently: Take 50 mg by mouth at bedtime. ) 30 tablet 2 06/01/2016 at Unknown time    Review of Systems  Constitutional: Negative for chills and diaphoresis.  HENT: Negative for congestion and nosebleeds.   Cardiovascular: Negative for chest pain, palpitations and leg swelling.  Gastrointestinal: Negative for diarrhea, nausea and vomiting.  Genitourinary: Negative for dysuria, frequency and urgency.  Musculoskeletal: Negative for back pain and myalgias.  Neurological: Negative for dizziness and seizures.     Vitals:  BP 121/67   Pulse 91   Temp 98.8 F (37.1 C) (Oral)   Resp 18   Wt (!) 329 lb 1.9 oz (149.3 kg)   LMP 10/20/2015 (Approximate)   SpO2 100%   BMI 45.90 kg/m  Physical Examination: CONSTITUTIONAL: Well-developed, morbidly obeese well-nourished female in no acute distress.  HENT:  Normocephalic, atraumatic, External right and left ear normal. Oropharynx is clear and moist SKIN: Skin is warm and dry. No rash noted. Not diaphoretic. No erythema. No pallor. NEUROLGIC: Alert and oriented to person, place, and time. Normal reflexes, muscle tone coordination. No cranial nerve deficit noted. PSYCHIATRIC: Normal mood and affect. Normal behavior. Normal judgment and  thought content. CARDIOVASCULAR: Normal heart rate noted, regular rhythm RESPIRATORY: Effort and breath sounds normal, no problems with respiration noted ABDOMEN: Soft, nontender, nondistended, gravid. MUSCULOSKELETAL: Normal range of motion. 4+ pitting edema, 2+ distal pulses.  Cervix: 1/T/H  Membranes:intact Fetal Monitoring: Twin A 140/mod var/reactive Twin B 135/mod var/reactive Tocometer: Flat  Labs:  Results for orders placed or performed during the hospital encounter of 06/02/16 (from the past 24 hour(s))  CBC   Collection Time: 06/02/16 12:14 PM  Result Value Ref Range   WBC 6.9 4.0 - 10.5 K/uL   RBC 3.31 (L) 3.87 - 5.11 MIL/uL   Hemoglobin 8.6 (L) 12.0 - 15.0 g/dL   HCT 16.1 (L) 09.6 - 04.5 %   MCV 80.4 78.0 - 100.0 fL   MCH 26.0 26.0 - 34.0 pg   MCHC 32.3 30.0 - 36.0 g/dL   RDW 40.9 81.1 - 91.4 %   Platelets 185 150 - 400 K/uL  Comprehensive metabolic panel   Collection Time: 06/02/16 12:14 PM  Result Value Ref Range   Sodium 135 135 - 145 mmol/L   Potassium 3.6 3.5 - 5.1 mmol/L   Chloride 108 101 - 111 mmol/L   CO2 20 (L) 22 - 32 mmol/L   Glucose, Bld 103 (H) 65 - 99 mg/dL   BUN 8 6 - 20 mg/dL   Creatinine, Ser 7.82 0.44 - 1.00 mg/dL   Calcium 8.8 (L) 8.9 - 10.3 mg/dL   Total Protein 6.5 6.5 - 8.1 g/dL   Albumin 2.3 (L) 3.5 - 5.0 g/dL   AST 15 15 - 41 U/L   ALT 6 (L) 14 - 54 U/L   Alkaline Phosphatase 85 38 - 126 U/L   Total Bilirubin 0.2 (L) 0.3 - 1.2 mg/dL   GFR calc non Af Amer >60 >60 mL/min   GFR calc Af Amer >60 >60 mL/min   Anion gap 7 5 - 15  Protein / creatinine ratio, urine   Collection Time: 06/02/16 12:34 PM  Result Value Ref Range   Creatinine, Urine 177.00 mg/dL   Total Protein, Urine 18 mg/dL   Protein Creatinine Ratio 0.10 0.00 - 0.15 mg/mg[Cre]  Urinalysis, Routine w reflex microscopic   Collection Time: 06/02/16 12:34 PM  Result Value Ref Range   Color, Urine YELLOW YELLOW   APPearance HAZY (A) CLEAR   Specific Gravity, Urine 1.018  1.005 - 1.030   pH 6.0 5.0 - 8.0   Glucose, UA NEGATIVE NEGATIVE mg/dL   Hgb urine dipstick SMALL (A) NEGATIVE   Bilirubin Urine NEGATIVE NEGATIVE   Ketones, ur NEGATIVE NEGATIVE mg/dL   Protein, ur NEGATIVE NEGATIVE mg/dL   Nitrite NEGATIVE NEGATIVE   Leukocytes, UA MODERATE (A) NEGATIVE   RBC / HPF 6-30 0 - 5 RBC/hpf   WBC, UA 0-5 0 - 5 WBC/hpf   Bacteria, UA RARE (A) NONE SEEN   Squamous Epithelial / LPF 6-30 (A) NONE SEEN   Mucous PRESENT     Imaging Studies: [place] Mfm Ob Follow Up  Result Date: 05/23/2016 ----------------------------------------------------------------------  OBSTETRICS REPORT                      (Signed Final 05/23/2016 12:29 pm) ---------------------------------------------------------------------- Patient Info  ID #:       [id number]                         D.O.B.:   February 10, 1985 (30 yrs)  Name:       Neena Rhymes                    Visit Date:  05/23/2016 11:27 am              [place] ---------------------------------------------------------------------- Performed By  Performed By:     Eden Lathe BS      Ref. Address:     [address]  [facility]                                                             [address] [place],                                                             [place] [address]  Attending:        Charlsie Merles MD         Location:         [facility]  Referred By:      Willodean Rosenthal MD ---------------------------------------------------------------------- Orders   #  Description                                 Code   1  [place] MFM OB FOLLOW UP                         [phone]   2  [place] MFM OB FOLLOW UP ADDL GEST               81191.47  ----------------------------------------------------------------------   #  Ordered By               Order #        Accession #    Episode #   1  Particia Nearing            [id number]      [id number]     [id number]   2  MARTHA DECKER            [id number]      [id number]      [id number]  ---------------------------------------------------------------------- Indications   [redacted] weeks gestation of pregnancy                Z3A.30   Twin pregnancy, mono/di, third trimester -     O30.033   normal ECHO x 2   Short interval between pregancies, 3rd         O09.893   trimester   Maternal morbid obesity                        O99.210 E66.01  ---------------------------------------------------------------------- OB History  Blood Type:            Height:  5'11"  Weight (lb):  295      BMI:   41.14  Gravidity:    7         Term:   4        Prem:   0        SAB:   2  TOP:          0       Ectopic:  0  Living: 4 ---------------------------------------------------------------------- Fetal Evaluation (Fetus A)  Num Of Fetuses:     2  Fetal Heart         155  Rate(bpm):  Cardiac Activity:   Observed  Fetal Lie:          Lower Fetus  Presentation:       Cephalic  Placenta:           Anterior, above cervical os  P. Cord Insertion:  Visualized, central  Amniotic Fluid  AFI FV:      Subjectively within normal limits                              Largest Pocket(cm)                              5.42 ---------------------------------------------------------------------- Biometry (Fetus A)  BPD:      77.7  mm     G. Age:  31w 1d         49  %    CI:        72.69   %   70 - 86                                                          FL/HC:      20.5   %   19.3 - 21.3  HC:      289.8  mm     G. Age:  31w 6d         43  %    HC/AC:      1.15       0.96 - 1.17  AC:      252.1  mm     G. Age:  29w 3d         11  %    FL/BPD:     76.3   %   71 - 87  FL:       59.3  mm     G. Age:  30w 6d         38  %    FL/AC:      23.5   %   20 - 24  HUM:        53  mm     G. Age:  30w 6d         53  %  Est. FW:    1543  gm      3 lb 6 oz     43  %     FW Discordancy         8  % ---------------------------------------------------------------------- Gestational Age (Fetus A)  LMP:           30w 6d       Date:   10/20/15                  EDD:   07/26/16  U/S Today:     30w 6d  EDD:   07/26/16  Best:          30w 6d    Det. By:   LMP  (10/20/15)          EDD:   07/26/16 ---------------------------------------------------------------------- Anatomy (Fetus A)  Cranium:               Appears normal         Aortic Arch:            Previously seen  Cavum:                 Previously seen        Ductal Arch:            Appears normal  Ventricles:            Previously seen        Diaphragm:              Appears normal  Choroid Plexus:        Previously seen        Stomach:                Appears normal, left                                                                        sided  Cerebellum:            Previously seen        Abdomen:                Appears normal  Posterior Fossa:       Previously seen        Abdominal Wall:         Previously seen  Nuchal Fold:           Previously seen        Cord Vessels:           Previously seen  Face:                  Appears normal         Kidneys:                Appear normal                         (orbits and profile)  Lips:                  Appears normal         Bladder:                Appears normal  Thoracic:              Appears normal         Spine:                  Previously seen  Heart:                 Appears normal         Upper Extremities:      Previously seen                         (  4CH, axis, and si  RVOT:                  Previously seen        Lower Extremities:      Previously seen  LVOT:                  Appears normal  Other:  Fetus appears to be a female. Heels and 5th digit previously visualized.          Nasal bone previously visualized. Open hands previously visualized. ---------------------------------------------------------------------- Fetal Evaluation (Fetus B)  Num Of Fetuses:     2  Fetal Heart         151  Rate(bpm):  Cardiac Activity:   Observed  Fetal Lie:          Upper Fetus  Presentation:       Cephalic  Placenta:            Anterior, above cervical os  P. Cord Insertion:  Visualized, central  Amniotic Fluid  AFI FV:      Subjectively within normal limits                              Largest Pocket(cm)                              6.7 ---------------------------------------------------------------------- Biometry (Fetus B)  BPD:      77.2  mm     G. Age:  31w 0d         42  %    CI:        69.42   %   70 - 86                                                          FL/HC:      20.0   %   19.3 - 21.3  HC:      295.8  mm     G. Age:  32w 5d         65  %    HC/AC:      1.11       0.96 - 1.17  AC:      265.9  mm     G. Age:  30w 5d         42  %    FL/BPD:     76.8   %   71 - 87  FL:       59.3  mm     G. Age:  30w 6d         38  %    FL/AC:      22.3   %   20 - 24  HUM:      53.4  mm     G. Age:  31w 1d         57  %  Est. FW:    1678  gm    3 lb 11 oz      57  %     FW Discordancy      0 \ 8 % ---------------------------------------------------------------------- Gestational Age (Fetus B)  LMP:  30w 6d       Date:   10/20/15                 EDD:   07/26/16  U/S Today:     31w 2d                                        EDD:   07/23/16  Best:          30w 6d    Det. By:   LMP  (10/20/15)          EDD:   07/26/16 ---------------------------------------------------------------------- Anatomy (Fetus B)  Cranium:               Appears normal         Aortic Arch:            Appears normal  Cavum:                 Appears normal         Ductal Arch:            Appears normal  Ventricles:            Appears normal         Diaphragm:              Previously seen  Choroid Plexus:        Previously seen        Stomach:                Appears normal, left                                                                        sided  Cerebellum:            Previously seen        Abdomen:                Appears normal  Posterior Fossa:       Previously seen        Abdominal Wall:         Previously seen  Nuchal Fold:           Previously seen         Cord Vessels:           Previously seen  Face:                  Orbits and profile     Kidneys:                Appear normal                         previously seen  Lips:                  Previously seen        Bladder:                Appears normal  Thoracic:              Appears normal  Spine:                  Previously seen  Heart:                 Previously seen        Upper Extremities:      Previously seen  RVOT:                  Previously seen        Lower Extremities:      Previously seen  LVOT:                  Previously seen  Other:  Fetus appears to be a female. Heels and 5th digit previously visualized.          Open hands previously visualized. Nasal bone previously visualized. ---------------------------------------------------------------------- Cervix Uterus Adnexa  Cervix  Not visualized (advanced GA >29wks)  Uterus  No abnormality visualized.  Left Ovary  Within normal limits.  Right Ovary  Within normal limits.  Cul De Sac:   No free fluid seen.  Adnexa:       No abnormality visualized. ---------------------------------------------------------------------- Impression  Monochorionic/diamniotic twin pregnancy at 30+6 weeks  Normal fetal movement and cardiac activity in both twins  Normal interval anatomy in both twins  Normal amniotic fluid volume With MVP of 5.4cm for A and  6.7cm for B  Appropriate interval growths with EFWs at the 43rd and 57th  percentiles. discordance is 8%  No evidence TTTS ---------------------------------------------------------------------- Recommendations  Repeat evaluation for TTTS in 2 weeks ----------------------------------------------------------------------                 Charlsie Merles, MD Electronically Signed Final Report   05/23/2016 12:29 pm ----------------------------------------------------------------------  [place] Mfm Ob Follow Up Addl Gest  Result Date: 05/23/2016 ----------------------------------------------------------------------  OBSTETRICS  REPORT                      (Signed Final 05/23/2016 12:29 pm) ---------------------------------------------------------------------- Patient Info  ID #:       [id number]                         D.O.B.:   1985-05-31 (30 yrs)  Name:       Neena Rhymes                    Visit Date:  05/23/2016 11:27 am              [place] ---------------------------------------------------------------------- Performed By  Performed By:     Eden Lathe BS      Ref. Address:     [address]                    [facility]                                                             [address] [place],  [place] [address]  Attending:        Charlsie Merles MD         Location:         [facility]  Referred By:      Willodean Rosenthal MD ---------------------------------------------------------------------- Orders   #  Description                                 Code   1  [place] MFM OB FOLLOW UP                         [phone]   2  [place] MFM OB FOLLOW UP ADDL GEST               95621.30  ----------------------------------------------------------------------   #  Ordered By               Order #        Accession #    Episode #   1  Particia Nearing            [id number]      [id number]     [id number]   2  MARTHA DECKER            [id number]      [id number]     [id number]  ---------------------------------------------------------------------- Indications   [redacted] weeks gestation of pregnancy                Z3A.30   Twin pregnancy, mono/di, third trimester -     O30.033   normal ECHO x 2   Short interval between pregancies, 3rd         O09.893   trimester   Maternal morbid obesity                        O99.210 E66.01  ---------------------------------------------------------------------- OB History  Blood Type:            Height:  5'11"  Weight (lb):  295      BMI:   41.14  Gravidity:    7         Term:   4        Prem:   0        SAB:   2  TOP:          0        Ectopic:  0        Living: 4 ---------------------------------------------------------------------- Fetal Evaluation (Fetus A)  Num Of Fetuses:     2  Fetal Heart         155  Rate(bpm):  Cardiac Activity:   Observed  Fetal Lie:          Lower Fetus  Presentation:       Cephalic  Placenta:           Anterior, above cervical os  P. Cord Insertion:  Visualized, central  Amniotic Fluid  AFI FV:      Subjectively within normal limits  Largest Pocket(cm)                              5.42 ---------------------------------------------------------------------- Biometry (Fetus A)  BPD:      77.7  mm     G. Age:  31w 1d         49  %    CI:        72.69   %   70 - 86                                                          FL/HC:      20.5   %   19.3 - 21.3  HC:      289.8  mm     G. Age:  31w 6d         43  %    HC/AC:      1.15       0.96 - 1.17  AC:      252.1  mm     G. Age:  29w 3d         11  %    FL/BPD:     76.3   %   71 - 87  FL:       59.3  mm     G. Age:  30w 6d         38  %    FL/AC:      23.5   %   20 - 24  HUM:        53  mm     G. Age:  30w 6d         53  %  Est. FW:    1543  gm      3 lb 6 oz     43  %     FW Discordancy         8  % ---------------------------------------------------------------------- Gestational Age (Fetus A)  LMP:           30w 6d       Date:   10/20/15                 EDD:   07/26/16  U/S Today:     30w 6d                                        EDD:   07/26/16  Best:          30w 6d    Det. By:   LMP  (10/20/15)          EDD:   07/26/16 ---------------------------------------------------------------------- Anatomy (Fetus A)  Cranium:               Appears normal         Aortic Arch:            Previously seen  Cavum:                 Previously seen        Ductal Arch:            Appears  normal  Ventricles:            Previously seen        Diaphragm:              Appears normal  Choroid Plexus:        Previously seen        Stomach:                Appears  normal, left                                                                        sided  Cerebellum:            Previously seen        Abdomen:                Appears normal  Posterior Fossa:       Previously seen        Abdominal Wall:         Previously seen  Nuchal Fold:           Previously seen        Cord Vessels:           Previously seen  Face:                  Appears normal         Kidneys:                Appear normal                         (orbits and profile)  Lips:                  Appears normal         Bladder:                Appears normal  Thoracic:              Appears normal         Spine:                  Previously seen  Heart:                 Appears normal         Upper Extremities:      Previously seen                         (4CH, axis, and si  RVOT:                  Previously seen        Lower Extremities:      Previously seen  LVOT:                  Appears normal  Other:  Fetus appears to be a female. Heels and 5th digit previously visualized.          Nasal bone previously visualized. Open hands previously visualized. ---------------------------------------------------------------------- Fetal Evaluation (Fetus B)  Num Of Fetuses:     2  Fetal Heart  151  Rate(bpm):  Cardiac Activity:   Observed  Fetal Lie:          Upper Fetus  Presentation:       Cephalic  Placenta:           Anterior, above cervical os  P. Cord Insertion:  Visualized, central  Amniotic Fluid  AFI FV:      Subjectively within normal limits                              Largest Pocket(cm)                              6.7 ---------------------------------------------------------------------- Biometry (Fetus B)  BPD:      77.2  mm     G. Age:  31w 0d         42  %    CI:        69.42   %   70 - 86                                                          FL/HC:      20.0   %   19.3 - 21.3  HC:      295.8  mm     G. Age:  32w 5d         65  %    HC/AC:      1.11       0.96 - 1.17  AC:      265.9  mm     G. Age:   30w 5d         42  %    FL/BPD:     76.8   %   71 - 87  FL:       59.3  mm     G. Age:  30w 6d         38  %    FL/AC:      22.3   %   20 - 24  HUM:      53.4  mm     G. Age:  31w 1d         57  %  Est. FW:    1678  gm    3 lb 11 oz      57  %     FW Discordancy      0 \ 8 % ---------------------------------------------------------------------- Gestational Age (Fetus B)  LMP:           30w 6d       Date:   10/20/15                 EDD:   07/26/16  U/S Today:     31w 2d                                        EDD:   07/23/16  Best:          30w 6d    Det. By:   LMP  (10/20/15)  EDD:   07/26/16 ---------------------------------------------------------------------- Anatomy (Fetus B)  Cranium:               Appears normal         Aortic Arch:            Appears normal  Cavum:                 Appears normal         Ductal Arch:            Appears normal  Ventricles:            Appears normal         Diaphragm:              Previously seen  Choroid Plexus:        Previously seen        Stomach:                Appears normal, left                                                                        sided  Cerebellum:            Previously seen        Abdomen:                Appears normal  Posterior Fossa:       Previously seen        Abdominal Wall:         Previously seen  Nuchal Fold:           Previously seen        Cord Vessels:           Previously seen  Face:                  Orbits and profile     Kidneys:                Appear normal                         previously seen  Lips:                  Previously seen        Bladder:                Appears normal  Thoracic:              Appears normal         Spine:                  Previously seen  Heart:                 Previously seen        Upper Extremities:      Previously seen  RVOT:                  Previously seen        Lower Extremities:      Previously seen  LVOT:  Previously seen  Other:  Fetus appears to be a female. Heels and 5th  digit previously visualized.          Open hands previously visualized. Nasal bone previously visualized. ---------------------------------------------------------------------- Cervix Uterus Adnexa  Cervix  Not visualized (advanced GA >29wks)  Uterus  No abnormality visualized.  Left Ovary  Within normal limits.  Right Ovary  Within normal limits.  Cul De Sac:   No free fluid seen.  Adnexa:       No abnormality visualized. ---------------------------------------------------------------------- Impression  Monochorionic/diamniotic twin pregnancy at 30+6 weeks  Normal fetal movement and cardiac activity in both twins  Normal interval anatomy in both twins  Normal amniotic fluid volume With MVP of 5.4cm for A and  6.7cm for B  Appropriate interval growths with EFWs at the 43rd and 57th  percentiles. discordance is 8%  No evidence TTTS ---------------------------------------------------------------------- Recommendations  Repeat evaluation for TTTS in 2 weeks ----------------------------------------------------------------------                 Charlsie Merles, MD Electronically Signed Final Report   05/23/2016 12:29 pm ----------------------------------------------------------------------  [place] Mfm Ob Limited  Result Date: 05/09/2016 ----------------------------------------------------------------------  OBSTETRICS REPORT                      (Signed Final 05/09/2016 11:51 am) ---------------------------------------------------------------------- Patient Info  ID #:       [id number]                         D.O.B.:   Aug 28, 1985 (30 yrs)  Name:       Neena Rhymes                    Visit Date:  05/09/2016 11:12 am              [place] ---------------------------------------------------------------------- Performed By  Performed By:     Eden Lathe BS      Ref. Address:     [address]                    RDMS RVT                                                             [address] [place],                                                              [place] [address]  Attending:        Charlsie Merles MD         Location:         [facility]  Referred By:      Willodean Rosenthal MD ---------------------------------------------------------------------- Orders   #  Description  Code   1  [place] MFM OB LIMITED                           [id number]  ----------------------------------------------------------------------   #  Ordered By               Order #        Accession #    Episode #   1  Particia Nearing            [id number]      [id number]     [id number]  ---------------------------------------------------------------------- Indications   [redacted] weeks gestation of pregnancy                Z3A.28   Twin pregnancy, mono/di, second trimester;     O30.032   ECHOs normal   Short interval between pregancies, 3rd         O09.893   trimester   Maternal morbid obesity                        O99.210 E66.01  ---------------------------------------------------------------------- OB History  Blood Type:            Height:  5'11"  Weight (lb):  295      BMI:   41.14  Gravidity:    7         Term:   4        Prem:   0        SAB:   2  TOP:          0       Ectopic:  0        Living: 4 ---------------------------------------------------------------------- Fetal Evaluation (Fetus A)  Num Of Fetuses:     2  Fetal Heart         153  Rate(bpm):  Cardiac Activity:   Observed  Fetal Lie:          Upper Fetus  Presentation:       Transverse, head to maternal right  Placenta:           Anterior, above cervical os  P. Cord Insertion:  Visualized, central  Membrane Desc:      Dividing Membrane seen - Monochorionic  Amniotic Fluid  AFI FV:      Subjectively within normal limits                              Largest Pocket(cm)                              5.8 ---------------------------------------------------------------------- Gestational Age (Fetus A)  LMP:           28w 6d       Date:   10/20/15                  EDD:   07/26/16  Best:          Eden Emms 6d    Det. By:   LMP  (10/20/15)          EDD:   07/26/16 ---------------------------------------------------------------------- Fetal Evaluation (Fetus B)  Num Of Fetuses:     2  Fetal Heart         153  Rate(bpm):  Cardiac Activity:   Observed  Fetal Lie:  Lower Fetus  Presentation:       Cephalic  Placenta:           Anterior, above cervical os  P. Cord Insertion:  Visualized, central  Membrane Desc:      Dividing Membrane seen - Monochorionic  Amniotic Fluid  AFI FV:      Subjectively within normal limits                              Largest Pocket(cm)                              5.87 ---------------------------------------------------------------------- Gestational Age (Fetus B)  LMP:           28w 6d       Date:   10/20/15                 EDD:   07/26/16  Best:          Eden Emms 6d    Det. By:   LMP  (10/20/15)          EDD:   07/26/16 ---------------------------------------------------------------------- Impression  Monochorionic/diamniotic twin pregnancy at 28+6 weeks,  here for TTTS check  Normal amniotic fluid volume x 2 with MVP of 5.8 for A and  5.9 for B  normal fetal movement and cardiac activity  No evidence of TTTS ---------------------------------------------------------------------- Recommendations  Repeat scan in 2 weeks, with growth evaluation ----------------------------------------------------------------------                 Charlsie Merles, MD Electronically Signed Final Report   05/09/2016 11:51 am ----------------------------------------------------------------------    Assessment and Plan: Patient Active Problem List   Diagnosis Date Noted  . Symptomatic anemia 06/02/2016  . Anemia affecting pregnancy in third trimester 05/22/2016  . Post-concussion headache 04/23/2016  . Encounter for procreative genetic counseling   . Low vitamin D level January 15, 2016  . 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 Supervision of high-risk  pregnancy 12/26/2015  . Monochorionic diamniotic twin gestation 12/19/2015  . Depression 07/09/2015   Admit to Antenatal Routine antenatal care  #1 gHTN: patient with 2 elevated BP in the out patient setting. Labs normal today. BP's normal in MAU - will get a 24 hour urine and trend BP's during admission - headache is better with fiorocet. #2 symptomatic anemia will transfuse 2 units PRBCs. 20mg  lasix in between and 20 mg after the 2nd unit. Repeat CBC in AM #3 BV continue flagyl previously prescribed.  #4 depression: continue zoloft 50 mg daily - patient has only been taking for 3 weeks, but is having symptoms still. Could consider increasing to 100mg  daily.   Ernestina Penna, MD OB Fellow. Faculty Practice, [facility]

## 2016-06-02 NOTE — MAU Note (Signed)
Patient was at ob today and was having contractions, increase in swelling. Patient was sent over for further evaluation for pre-eclampsia per patient. Patient endorses headache, vaginal pressure. Denies changes in vision; does not dizziness. + increase in swelling.

## 2016-06-02 NOTE — Progress Notes (Signed)
   PRENATAL VISIT NOTE  Subjective:  Jacqueline Clarke is a 31 y.o. [id number] at 7649w2d being seen today for ongoing prenatal care.  She is currently monitored for the following issues for this high-risk pregnancy and has Depression; Monochorionic diamniotic twin gestation; Supervision of high-risk pregnancy; Low vitamin D level; Anti-D antibodies present during pregnancy; Encounter for procreative genetic counseling; Post-concussion headache; and Anemia affecting pregnancy in third trimester on her problem list.  Patient reports headache not resolved with tylenol. Worsening contractions depspite procardia, hand and feet edema.  Contractions: Irregular. Vag. Bleeding: None.  Movement: Present. Denies leaking of fluid.   The following portions of the patient's history were reviewed and updated as appropriate: allergies, current medications, past family history, past medical history, past social history, past surgical history and problem list. Problem list updated.  Objective:   Vitals:   06/02/16 1120  BP: (!) 131/94  Pulse: (!) 105  Weight: (!) 328 lb 6.4 oz (149 kg)    Fetal Status: Fetal Heart Rate (bpm): 145/140   Movement: Present     General:  Alert, oriented and cooperative. Patient is in no acute distress.  Skin: Skin is warm and dry. No rash noted.   Cardiovascular: Normal heart rate noted  Respiratory: Normal respiratory effort, no problems with respiration noted  Abdomen: Soft, gravid, appropriate for gestational age. Pain/Pressure: Present     Pelvic:  Cervical exam deferred        Extremities: Normal range of motion.  Edema: Trace  Mental Status: Normal mood and affect. Normal behavior. Normal judgment and thought content.   Assessment and Plan:  Pregnancy: [id number] at 5749w2d  1. Supervision of high risk pregnancy in third trimester Patient with elevated BP today and HA not resolved with tylenol. Patient sent to MAU for further evaluation NST to be completed at  MAU  2. Monochorionic diamniotic twin gestation in third trimester Follow up ultrasound already scheduled Will start twice weekly NST  Preterm labor symptoms and general obstetric precautions including but not limited to vaginal bleeding, contractions, leaking of fluid and fetal movement were reviewed in detail with the patient. Please refer to After Visit Summary for other counseling recommendations.  Return in about 1 week (around 06/09/2016) for ROB, NST.   Kaeden Depaz, Gigi GinPeggy, MD

## 2016-06-03 DIAGNOSIS — O133 Gestational [pregnancy-induced] hypertension without significant proteinuria, third trimester: Secondary | ICD-10-CM

## 2016-06-03 DIAGNOSIS — D649 Anemia, unspecified: Secondary | ICD-10-CM | POA: Diagnosis not present

## 2016-06-03 LAB — CBC
HEMATOCRIT: 29.7 % — AB (ref 36.0–46.0)
Hemoglobin: 9.6 g/dL — ABNORMAL LOW (ref 12.0–15.0)
MCH: 25.9 pg — AB (ref 26.0–34.0)
MCHC: 32.3 g/dL (ref 30.0–36.0)
MCV: 80.1 fL (ref 78.0–100.0)
PLATELETS: 183 10*3/uL (ref 150–400)
RBC: 3.71 MIL/uL — ABNORMAL LOW (ref 3.87–5.11)
RDW: 15.3 % (ref 11.5–15.5)
WBC: 10.3 10*3/uL (ref 4.0–10.5)

## 2016-06-03 LAB — CREATININE CLEARANCE, URINE, 24 HOUR
COLLECTION INTERVAL-CRCL: 24 h
CREATININE 24H UR: 2049 mg/d — AB (ref 600–1800)
CREATININE, URINE: 46.31 mg/dL
Creatinine Clearance: 233 mL/min — ABNORMAL HIGH (ref 75–115)
Urine Total Volume-CRCL: 4425 mL

## 2016-06-03 LAB — BPAM RBC
Blood Product Expiration Date: 201805302359
Blood Product Expiration Date: 201805302359
ISSUE DATE / TIME: 201805142142
ISSUE DATE / TIME: 201805141724
UNIT TYPE AND RH: 9500
Unit Type and Rh: 9500

## 2016-06-03 LAB — TYPE AND SCREEN
ABO/RH(D): O NEG
Antibody Screen: NEGATIVE
UNIT DIVISION: 0
UNIT DIVISION: 0

## 2016-06-03 LAB — PROTEIN, URINE, 24 HOUR
Collection Interval-UPROT: 24 hours
Urine Total Volume-UPROT: 4425 mL

## 2016-06-03 MED ORDER — SODIUM CHLORIDE 0.9 % IV BOLUS (SEPSIS)
250.0000 mL | Freq: Once | INTRAVENOUS | Status: AC
  Administered 2016-06-03: 250 mL via INTRAVENOUS

## 2016-06-03 MED ORDER — TERBUTALINE SULFATE 1 MG/ML IJ SOLN
0.2500 mg | Freq: Once | INTRAMUSCULAR | Status: AC
  Administered 2016-06-03: 0.25 mg via SUBCUTANEOUS
  Filled 2016-06-03: qty 1

## 2016-06-03 MED ORDER — PROMETHAZINE HCL 25 MG/ML IJ SOLN
25.0000 mg | Freq: Once | INTRAMUSCULAR | Status: AC
  Administered 2016-06-03: 25 mg via INTRAVENOUS
  Filled 2016-06-03: qty 1

## 2016-06-03 MED ORDER — NALBUPHINE HCL 10 MG/ML IJ SOLN
10.0000 mg | Freq: Once | INTRAMUSCULAR | Status: AC
  Administered 2016-06-03: 10 mg via INTRAVENOUS
  Filled 2016-06-03: qty 1

## 2016-06-03 MED ORDER — PRENATAL MULTIVITAMIN CH: 1.0000 | ORAL_TABLET | Freq: Every day | ORAL | 6 refills | Status: DC

## 2016-06-03 NOTE — Discharge Instructions (Signed)
Follow Up with OB

## 2016-06-03 NOTE — Progress Notes (Signed)
Patient ID: Jacqueline Clarke, female   DOB: 24-Jun-1985, 31 y.o.   MRN: [id number] [facility] PR[facility]ICE ANTEPARTUM COMPREHENSIVE PROGRESS NOTE  Jacqueline Clarke is a 31 y.o. [id number] at [redacted]w[redacted]d  Twin gestation Mono/Di who is admitted for Sx anemia and R/O PEC   Fetal presentation is vtx/vtx Length of Stay:  0  Days  Subjective: Pt without complaints this morning. Denies HA or visual changes. Up to rest room with problems, denies lightheaded. She did have some ut ctx last evening responded to medication and resolved Patient reports good fetal movement.  She reports no uterine contractions, no bleeding and no loss of fluid per vagina.  Vitals:  Blood pressure 106/68, pulse 98, temperature 98.5 F (36.9 C), temperature source Oral, resp. rate 18, height 5\' 10"  (1.778 m), weight (!) 146.7 kg (323 lb 8 oz), last menstrual period 10/20/2015, SpO2 96 %, currently breastfeeding.   Physical Examination: Lungs clear  Heart RRR Abd soft + BS gravid Ext 2-3+ edema. DTR's + 2   Fetal Monitoring:  140-160's. + accels  Labs:  Results for orders placed or performed during the hospital encounter of 06/02/16 (from the past 24 hour(s))  CBC   Collection Time: 06/02/16 12:14 PM  Result Value Ref Range   WBC 6.9 4.0 - 10.5 K/uL   RBC 3.31 (L) 3.87 - 5.11 MIL/uL   Hemoglobin 8.6 (L) 12.0 - 15.0 g/dL   HCT 14.7 (L) 82.9 - 56.2 %   MCV 80.4 78.0 - 100.0 fL   MCH 26.0 26.0 - 34.0 pg   MCHC 32.3 30.0 - 36.0 g/dL   RDW 13.0 86.5 - 78.4 %   Platelets 185 150 - 400 K/uL  Comprehensive metabolic panel   Collection Time: 06/02/16 12:14 PM  Result Value Ref Range   Sodium 135 135 - 145 mmol/L   Potassium 3.6 3.5 - 5.1 mmol/L   Chloride 108 101 - 111 mmol/L   CO2 20 (L) 22 - 32 mmol/L   Glucose, Bld 103 (H) 65 - 99 mg/dL   BUN 8 6 - 20 mg/dL   Creatinine, Ser 6.96 0.44 - 1.00 mg/dL   Calcium 8.8 (L) 8.9 - 10.3 mg/dL   Total Protein 6.5 6.5 - 8.1 g/dL   Albumin 2.3 (L) 3.5 - 5.0 g/dL   AST 15 15 -  41 U/L   ALT 6 (L) 14 - 54 U/L   Alkaline Phosphatase 85 38 - 126 U/L   Total Bilirubin 0.2 (L) 0.3 - 1.2 mg/dL   GFR calc non Af Amer >60 >60 mL/min   GFR calc Af Amer >60 >60 mL/min   Anion gap 7 5 - 15  Type and screen [facility]   Collection Time: 06/02/16 12:18 PM  Result Value Ref Range   ABO/RH(D) O NEG    Antibody Screen NEG    Sample Expiration 06/05/2016    Unit Number [id number]    Blood Component Type RCLI PHER 2    Unit division 00    Status of Unit ISSUED    Transfusion Status OK TO TRANSFUSE    Crossmatch Result Compatible    Unit Number [id number]    Blood Component Type RBC, LR IRR    Unit division 00    Status of Unit ISSUED    Transfusion Status OK TO TRANSFUSE    Crossmatch Result Compatible   BPAM RBC   Collection Time: 06/02/16 12:18 PM  Result Value Ref Range   ISSUE DATE / TIME  [id number]    Blood Product Unit Number [id number]    PRODUCT CODE [id number]    Unit Type and Rh 9500    Blood Product Expiration Date [id number]    ISSUE DATE / TIME [id number]    Blood Product Unit Number [id number]    PRODUCT CODE E0332V00    Unit Type and Rh 9500    Blood Product Expiration Date [id number]   Protein / creatinine ratio, urine   Collection Time: 06/02/16 12:34 PM  Result Value Ref Range   Creatinine, Urine 177.00 mg/dL   Total Protein, Urine 18 mg/dL   Protein Creatinine Ratio 0.10 0.00 - 0.15 mg/mg[Cre]  Urinalysis, Routine w reflex microscopic   Collection Time: 06/02/16 12:34 PM  Result Value Ref Range   Color, Urine YELLOW YELLOW   APPearance HAZY (A) CLEAR   Specific Gravity, Urine 1.018 1.005 - 1.030   pH 6.0 5.0 - 8.0   Glucose, UA NEGATIVE NEGATIVE mg/dL   Hgb urine dipstick SMALL (A) NEGATIVE   Bilirubin Urine NEGATIVE NEGATIVE   Ketones, ur NEGATIVE NEGATIVE mg/dL   Protein, ur NEGATIVE NEGATIVE mg/dL   Nitrite NEGATIVE NEGATIVE   Leukocytes, UA MODERATE (A) NEGATIVE   RBC / HPF 6-30 0 - 5  RBC/hpf   WBC, UA 0-5 0 - 5 WBC/hpf   Bacteria, UA RARE (A) NONE SEEN   Squamous Epithelial / LPF 6-30 (A) NONE SEEN   Mucous PRESENT   Prepare RBC   Collection Time: 06/02/16  3:00 PM  Result Value Ref Range   Order Confirmation ORDER PROCESSED BY BLOOD BANK   CBC   Collection Time: 06/03/16  5:15 AM  Result Value Ref Range   WBC 10.3 4.0 - 10.5 K/uL   RBC 3.71 (L) 3.87 - 5.11 MIL/uL   Hemoglobin 9.6 (L) 12.0 - 15.0 g/dL   HCT 88.429.7 (L) 16.636.0 - 06.346.0 %   MCV 80.1 78.0 - 100.0 fL   MCH 25.9 (L) 26.0 - 34.0 pg   MCHC 32.3 30.0 - 36.0 g/dL   RDW 01.615.3 01.011.5 - 93.215.5 %   Platelets 183 150 - 400 K/uL    Imaging Studies:    none   Medications:  Scheduled . aspirin EC  81 mg Oral Daily  . docusate sodium  100 mg Oral Daily  . metroNIDAZOLE  500 mg Oral BID  . prenatal multivitamin  1 tablet Oral Q1200  . sertraline  50 mg Oral Daily   I have reviewed the patient's current medications.  ASSESSMENT: IUP 32 3/7 weeks Twins Mono/Di   Vtx/Vtx Anemia Elevated BP r/o PEC Morbid obesity  PLAN: BP stable since admission. S/P blood transfusion. 24 hr to be completed this afternoon, pending results probable discharge home later today Continue routine antenatal care.   Jacqueline StaggersMichael L Olubunmi Clarke 06/03/2016,7:45 AM

## 2016-06-03 NOTE — Progress Notes (Signed)
Pt called out complaining of lower back and abdominal pain. Placed pt on monitor to monitor contractions. Will continue to monitor.

## 2016-06-03 NOTE — Discharge Summary (Signed)
OB Discharge Summary     Patient Name: Jacqueline Clarke DOB: 07-Dec-1985 MRN: [id number]  Date of admission: 06/02/2016 Delivering MD: This patient has no babies on file.  Date of discharge: 06/03/2016  Admitting diagnosis: 32wks twins elevated bp  Intrauterine pregnancy: [redacted]w[redacted]d     Secondary diagnosis:  Active Problems:   Symptomatic anemia    Discharge diagnosis: Preterm Mono/Di twins undelivered with [facility] course:  Patient admitted with elevated BP and rule out preeclampsia. Patient also noted to have symptomatic anemia and was transfused 2 units pRBC. During her hospitalization, her BP were all normal. Labs were also normal. A 24 hour urine collection for protein was collected. Following blood transfusion, patient reported feeling much improved without complaints of HA, visual changes, RUQ/epigastric pain. Patient was discharged upon completion of her 24 hour urine collection with plans to discuss results at her next routine ob appointment.  Physical exam  Vitals:   06/03/16 1023 06/03/16 1200 06/03/16 1557 06/03/16 1930  BP: 111/64 116/61 116/65 (!) 120/59  Pulse: 96 (!) 101 94 99  Resp: 18 18 20 20   Temp: 98.8 F (37.1 C) 98.7 F (37.1 C) 98.3 F (36.8 C) 98.5 F (36.9 C)  TempSrc: Oral Oral Oral Oral  SpO2: 97%  98% 99%  Weight:      Height:       General: alert, cooperative and no distress Abdomen: soft, gravid, non tender DVT Evaluation: Negative Homan's sign. No cords or calf tenderness.  FHT: baseline 135/140, mod variability x 2, + accels x2, no decels x2 Toco: irregular contractions  Labs: Lab Results  Component Value Date   WBC 10.3 06/03/2016   HGB 9.6 (L) 06/03/2016   HCT 29.7 (L) 06/03/2016   MCV 80.1 06/03/2016   PLT 183 06/03/2016   CMP Latest Ref Rng & Units 06/02/2016  Glucose 65 - 99 mg/dL 782(N)  BUN 6 - 20 mg/dL 8  Creatinine 5.62  - 1.30 mg/dL 8.65  Sodium 784 - 696 mmol/L 135  Potassium 3.5 - 5.1 mmol/L 3.6  Chloride 101 - 111 mmol/L 108  CO2 22 - 32 mmol/L 20(L)  Calcium 8.9 - 10.3 mg/dL 2.9(B)  Total Protein 6.5 - 8.1 g/dL 6.5  Total Bilirubin 0.3 - 1.2 mg/dL 2.8(U)  Alkaline Phos 38 - 126 U/L 85  AST 15 - 41 U/L 15  ALT 14 - 54 U/L 6(L)    Discharge instruction: per After Visit Summary and "Baby and Me Booklet".  After visit meds:  Allergies as of 06/03/2016   No Known Allergies     Medication List    TAKE these medications   acetaminophen 325 MG tablet Commonly known as:  TYLENOL Take 650 mg by mouth every 6 (six) hours as needed for moderate pain.   aspirin EC 81 MG tablet Take 1 tablet (81 mg total) by mouth daily. Take after 12 weeks for prevention of preeclampsia later in pregnancy  cyclobenzaprine 10 MG tablet Commonly known as:  FLEXERIL Take 1 tablet by mouth 3 (three) times daily as needed for muscle spasms.   Doxylamine-Pyridoxine 10-10 MG Tbec Commonly known as:  DICLEGIS Take 2 tablets by mouth at bedtime. If symptoms persist, add one tablet in the morning and one in the afternoon   metroNIDAZOLE 500 MG tablet Commonly known as:  FLAGYL Take 1 tablet (500 mg total) by mouth 2 (two) times daily.   NIFEdipine 10 MG capsule Commonly known as:  PROCARDIA Take 1 capsule (10 mg total) by mouth every 6 (six) hours as needed (contractions). For more than 5 contractions per hour   prenatal multivitamin Tabs tablet Take 1 tablet by mouth daily at 12 noon. Start taking on:  06/04/2016   sertraline 50 MG tablet Commonly known as:  ZOLOFT Take 1 tablet (50 mg total) by mouth daily. Take 1/2 tablet qd x 7 days and then increase to 1 whole tablet qd What changed:  when to take this  additional instructions       Diet: routine diet  Activity: Advance as tolerated. Pelvic rest for 6 weeks.   Outpatient follow up: 1 week Follow up Appt: Future Appointments Date Time Provider  Department Center  06/06/2016 11:00 AM [facility] US 3 [facility]   Follow up Visit:No Follow-up on file.   06/03/2016 Catalina AntiguaPeggy Annissa Andreoni, MD

## 2016-06-03 NOTE — Progress Notes (Signed)
Pt denies pain or contractions at this time

## 2016-06-03 NOTE — Progress Notes (Signed)
Dr. Alysia PennaErvin notified of contractions every 2-4 minutes, lower back pain, and upper abdominal pain. 0.25 mg terbutaline ordered. Will administer and monitor pt.

## 2016-06-03 NOTE — Progress Notes (Signed)
Pt still complaining of back and abdominal pain. Contracting every 2-6 minutes. Dr. Alysia PennaErvin notified. 10 mg Nubain and 25 mg Phenergan ordered. Will continue to monitor contraction pattern.

## 2016-06-06 ENCOUNTER — Other Ambulatory Visit (HOSPITAL_COMMUNITY): Payer: Self-pay | Admitting: Maternal and Fetal Medicine

## 2016-06-06 ENCOUNTER — Encounter (HOSPITAL_COMMUNITY): Payer: Self-pay

## 2016-06-06 ENCOUNTER — Ambulatory Visit (HOSPITAL_COMMUNITY)
Admission: RE | Admit: 2016-06-06 | Discharge: 2016-06-06 | Disposition: A | Discharge status: Home / Self Care | Payer: Medicaid Other | Source: Ambulatory Visit | Attending: Certified Nurse Midwife | Admitting: Certified Nurse Midwife

## 2016-06-06 ENCOUNTER — Other Ambulatory Visit (HOSPITAL_COMMUNITY): Payer: Self-pay | Admitting: *Deleted

## 2016-06-06 DIAGNOSIS — O30033 Twin pregnancy, monochorionic/diamniotic, third trimester: Secondary | ICD-10-CM

## 2016-06-06 DIAGNOSIS — O139 Gestational [pregnancy-induced] hypertension without significant proteinuria, unspecified trimester: Secondary | ICD-10-CM

## 2016-06-06 DIAGNOSIS — O99213 Obesity complicating pregnancy, third trimester: Secondary | ICD-10-CM

## 2016-06-06 DIAGNOSIS — O30039 Twin pregnancy, monochorionic/diamniotic, unspecified trimester: Secondary | ICD-10-CM

## 2016-06-06 DIAGNOSIS — Z6841 Body Mass Index (BMI) 40.0 and over, adult: Secondary | ICD-10-CM | POA: Insufficient documentation

## 2016-06-06 DIAGNOSIS — O09893 Supervision of other high risk pregnancies, third trimester: Secondary | ICD-10-CM

## 2016-06-06 DIAGNOSIS — O30049 Twin pregnancy, dichorionic/diamniotic, unspecified trimester: Secondary | ICD-10-CM

## 2016-06-06 DIAGNOSIS — Z3A32 32 weeks gestation of pregnancy: Secondary | ICD-10-CM | POA: Diagnosis not present
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
## 2016-06-09 ENCOUNTER — Other Ambulatory Visit (HOSPITAL_COMMUNITY): Payer: Self-pay | Admitting: *Deleted

## 2016-06-09 DIAGNOSIS — O30033 Twin pregnancy, monochorionic/diamniotic, third trimester: Secondary | ICD-10-CM

## 2016-06-10 ENCOUNTER — Inpatient Hospital Stay (HOSPITAL_COMMUNITY)
Admission: AD | Admit: 2016-06-10 | Discharge: 2016-06-10 | Discharge status: Against Medical Advice | Payer: Medicaid Other | Source: Ambulatory Visit | Attending: Family Medicine | Admitting: Family Medicine

## 2016-06-10 ENCOUNTER — Ambulatory Visit (INDEPENDENT_AMBULATORY_CARE_PROVIDER_SITE_OTHER): Payer: Medicaid Other | Admitting: Obstetrics

## 2016-06-10 ENCOUNTER — Encounter: Payer: Self-pay | Admitting: Obstetrics

## 2016-06-10 ENCOUNTER — Encounter (HOSPITAL_COMMUNITY): Payer: Self-pay | Admitting: *Deleted

## 2016-06-10 DIAGNOSIS — O99343 Other mental disorders complicating pregnancy, third trimester: Secondary | ICD-10-CM | POA: Diagnosis not present

## 2016-06-10 DIAGNOSIS — Z3A33 33 weeks gestation of pregnancy: Secondary | ICD-10-CM | POA: Insufficient documentation

## 2016-06-10 DIAGNOSIS — O4700 False labor before 37 completed weeks of gestation, unspecified trimester: Secondary | ICD-10-CM

## 2016-06-10 DIAGNOSIS — O99213 Obesity complicating pregnancy, third trimester: Secondary | ICD-10-CM | POA: Insufficient documentation

## 2016-06-10 DIAGNOSIS — F419 Anxiety disorder, unspecified: Secondary | ICD-10-CM | POA: Diagnosis not present

## 2016-06-10 DIAGNOSIS — Z3689 Encounter for other specified antenatal screening: Secondary | ICD-10-CM

## 2016-06-10 DIAGNOSIS — M549 Dorsalgia, unspecified: Secondary | ICD-10-CM | POA: Diagnosis not present

## 2016-06-10 DIAGNOSIS — O99513 Diseases of the respiratory system complicating pregnancy, third trimester: Secondary | ICD-10-CM | POA: Insufficient documentation

## 2016-06-10 DIAGNOSIS — J45909 Unspecified asthma, uncomplicated: Secondary | ICD-10-CM | POA: Insufficient documentation

## 2016-06-10 DIAGNOSIS — O30033 Twin pregnancy, monochorionic/diamniotic, third trimester: Secondary | ICD-10-CM | POA: Diagnosis not present

## 2016-06-10 DIAGNOSIS — R102 Pelvic and perineal pain: Secondary | ICD-10-CM | POA: Diagnosis present

## 2016-06-10 DIAGNOSIS — G8929 Other chronic pain: Secondary | ICD-10-CM | POA: Insufficient documentation

## 2016-06-10 DIAGNOSIS — O4703 False labor before 37 completed weeks of gestation, third trimester: Secondary | ICD-10-CM

## 2016-06-10 DIAGNOSIS — M419 Scoliosis, unspecified: Secondary | ICD-10-CM | POA: Diagnosis not present

## 2016-06-10 DIAGNOSIS — Z7982 Long term (current) use of aspirin: Secondary | ICD-10-CM | POA: Insufficient documentation

## 2016-06-10 DIAGNOSIS — O30039 Twin pregnancy, monochorionic/diamniotic, unspecified trimester: Secondary | ICD-10-CM | POA: Diagnosis not present

## 2016-06-10 DIAGNOSIS — Z87891 Personal history of nicotine dependence: Secondary | ICD-10-CM | POA: Insufficient documentation

## 2016-06-10 LAB — WET PREP, GENITAL
Clue Cells Wet Prep HPF POC: NONE SEEN
SPERM: NONE SEEN
Trich, Wet Prep: NONE SEEN
Yeast Wet Prep HPF POC: NONE SEEN

## 2016-06-10 NOTE — MAU Note (Signed)
Pt states her feet are swollen, feels like her circulation is being cut off in her L foot.  Has had HA since 0400, having contractions & pressure for the last 2 days.  Denies bleeding or LOF.

## 2016-06-10 NOTE — Progress Notes (Signed)
Pt here for a ROB/NST. Pt is having vaginal pain and pressure. States that she lost her mucous plug 3 days ago. Pt would like to be checked today

## 2016-06-10 NOTE — Progress Notes (Signed)
Subjective:  Jacqueline Clarke is a 31 y.o. [id number] at 4880w3d being seen today for ongoing prenatal care.  She is currently monitored for the following issues for this high-risk pregnancy and has Depression; Monochorionic diamniotic twin gestation; Supervision of high-risk pregnancy; Low vitamin D level; Anti-D antibodies present during pregnancy; Encounter for procreative genetic counseling; Post-concussion headache; Anemia affecting pregnancy in third trimester; and Symptomatic anemia on her problem list.  Patient reports backache and contractions since yesterday.  Contractions: Irregular. Vag. Bleeding: None.  Movement: Present. Denies leaking of fluid.   The following portions of the patient's history were reviewed and updated as appropriate: allergies, current medications, past family history, past medical history, past social history, past surgical history and problem list. Problem list updated.  Objective:   Vitals:   06/10/16 0918  BP: 128/88  Pulse: 97  Weight: (!) 328 lb 3.2 oz (148.9 kg)    Fetal Status: Fetal Heart Rate (bpm): NST   Movement: Present     General:  Alert, oriented and cooperative. Patient is in no acute distress.  Skin: Skin is warm and dry. No rash noted.   Cardiovascular: Normal heart rate noted  Respiratory: Normal respiratory effort, no problems with respiration noted  Abdomen: Soft, gravid, appropriate for gestational age. Pain/Pressure: Present     Pelvic:  Cervical exam deferred        Extremities: Normal range of motion.  Edema: Moderate pitting, indentation subsides rapidly  Mental Status: Normal mood and affect. Normal behavior. Normal judgment and thought content.   Urinalysis:      NST:  Reactive.  UC's every 3-5 minutes  Assessment and Plan:  1. Pregnancy: [id number] at 33w3  2. Monochorionic / Diamniotic Twins - Threatened PTL - [facility]  There are no diagnoses linked to this encounter. Preterm labor symptoms  and general obstetric precautions including but not limited to vaginal bleeding, contractions, leaking of fluid and fetal movement were reviewed in detail with the patient. Please refer to After Visit Summary for other counseling recommendations.  Return in 1 week (on 06/17/2016) for ROB.   Brock BadHarper, Charles A, MDPatient ID: Jacqueline Reaperrachelle L Kohlmann, female   DOB: [birth year], 30 y.o.   MRN: [id number]

## 2016-06-10 NOTE — MAU Note (Addendum)
Pt states she needs to pick up her kids from school, wants to sign out AMA.  Encouraged pt to stay, APP is requesting a few more minutes of continuous tracing to ensure fetal wellbeing, need to readjust EFM.  Pt wants to sign AMA form, can't stay.  Form signed.

## 2016-06-10 NOTE — MAU Provider Note (Signed)
History     CSN: [id number]  Arrival date and time: 06/10/16 1027   First Provider Initiated Contact with Patient 06/10/16 1122      Chief Complaint  Patient presents with  . Leg Swelling  . Headache  . Contractions   [id number] @33 .3 wks here with ctx and pelvic pressure since 0100. Ctx are about 7 per hr, lasting just a short time. No VB or LOF. She reports white discharge with itching since last week. Reports good FM. She also reports a frontal HA around 0200 for whichshe used Tylenol and had relief. She reports a nosebleed around 0630 that responded to compression and position. Reports good hydration with water and juice.    OB History    Gravida Para Term Preterm AB Living   7 4 4  0 2 4   SAB TAB Ectopic Multiple Live Births   2 0 0   4      Past Medical History:  Diagnosis Date  . Anemia   . Anxiety   . Asthma   . Blood type, Rh negative   . Cholelithiasis   . Chronic back pain    scoliosis- on percocet  . Depression   . Headache(784.0)   . Hyperemesis complicating pregnancy, antepartum 2013  . 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 Obesity   . Pregnancy induced hypertension   . Scoliosis     Past Surgical History:  Procedure Laterality Date  . ADENOIDECTOMY    . ADENOIDECTOMY    . arm surgery    . CHOLECYSTECTOMY    . DILATION AND CURETTAGE OF UTERUS  2008  . MR WRIST RIGHT     sx to nerves  . NERVE, TENDON AND ARTERY REPAIR Right 06/18/2014   Procedure: NERVE, TENDON AND ARTERY REPAIR;  Surgeon: Bradly Bienenstock, MD;  Location: [facility] OR;  Service: Orthopedics;  Laterality: Right;  . TONSILLECTOMY AND ADENOIDECTOMY      Family History  Problem Relation Age of Onset  . Heart disease Maternal Grandfather   . Diabetes Maternal Grandfather   . Heart disease Father   . Anesthesia problems Neg Hx     Social History  Substance Use Topics  . Smoking status: Former Smoker    Packs/day: 0.50    Types: Cigarettes  . Smokeless tobacco: Never Used  .  Alcohol use No     Comment: Occassionally    Allergies: No Known Allergies  Prescriptions Prior to Admission  Medication Sig Dispense Refill Last Dose  . acetaminophen (TYLENOL) 325 MG tablet Take 650 mg by mouth every 6 (six) hours as needed for moderate pain.   06/10/2016 at Unknown time  . aspirin EC 81 MG tablet Take 1 tablet (81 mg total) by mouth daily. Take after 12 weeks for prevention of preeclampsia later in pregnancy 300 tablet 2 06/10/2016 at Unknown time  . cyclobenzaprine (FLEXERIL) 10 MG tablet Take 1 tablet by mouth 3 (three) times daily as needed for muscle spasms.   3 06/09/2016 at Unknown time  . Doxylamine-Pyridoxine (DICLEGIS) 10-10 MG TBEC Take 2 tablets by mouth at bedtime. If symptoms persist, add one tablet in the morning and one in the afternoon 30 tablet 1 06/09/2016 at Unknown time  . Prenatal Vit-Fe Fumarate-FA (PRENATAL MULTIVITAMIN) TABS tablet Take 1 tablet by mouth daily at 12 noon. 30 tablet 6 06/10/2016 at Unknown time  . sertraline (ZOLOFT) 50 MG tablet Take 1 tablet (50 mg total) by mouth daily. Take 1/2  tablet qd x 7 days and then increase to 1 whole tablet qd (Patient taking differently: Take 50 mg by mouth at bedtime. ) 30 tablet 2 06/10/2016 at Unknown time    Review of Systems  Gastrointestinal: Positive for abdominal pain.  Genitourinary: Positive for vaginal discharge. Negative for difficulty urinating and vaginal bleeding.   Physical Exam   Blood pressure 112/75, pulse 100, temperature 98.8 F (37.1 C), temperature source Oral, resp. rate 18, last menstrual period 10/20/2015, currently breastfeeding.  Physical Exam  Nursing note and vitals reviewed. Constitutional: She is oriented to person, place, and time. She appears well-developed and well-nourished. No distress (appears comfortable).  HENT:  Head: Normocephalic and atraumatic.  Neck: Normal range of motion.  Respiratory: Effort normal.  GI: Soft. She exhibits no distension. There is no  tenderness.  gravid  Genitourinary:  Genitourinary Comments: External: no lesions or erythema Vagina: rugated, parous, thick white discharge Cervix FT/50   Musculoskeletal: Normal range of motion.  Neurological: She is alert and oriented to person, place, and time.  Skin: Skin is warm and dry.  Psychiatric: She has a normal mood and affect.   EFM-A: 145 bpm, mod variability, + accels, no decels EFM-B: 150 bpm, mod variability, + accels, no decels Toco: irregular ctx, irritability  Results for orders placed or performed during the hospital encounter of 06/10/16 (from the past 24 hour(s))  Wet prep, genital     Status: Abnormal   Collection Time: 06/10/16 11:40 AM  Result Value Ref Range   Yeast Wet Prep HPF POC NONE SEEN NONE SEEN   Trich, Wet Prep NONE SEEN NONE SEEN   Clue Cells Wet Prep HPF POC NONE SEEN NONE SEEN   WBC, Wet Prep HPF POC MANY (A) NONE SEEN   Sperm NONE SEEN    MAU Course  Procedures Po hydration  MDM Labs ordered and reviewed. No evidence of pelvic infection. No cervical change from previous exam. Irregular ctx noted and pt reports reduction of ctx since arrival. No evidence of PTL. FHT initially reactive x2 then US-A tracing maternal HR. Recommend readjustment of US and short period of surveillance before discharge, pt cannot wait and desires to leave AMA.  Assessment and Plan   1. Preterm uterine contractions in third trimester, antepartum   2. Monochorionic diamniotic twin gestation in third trimester   3. NST (non-stress test) reactive    Left AMA   Donette LarryMelanie Tay Whitwell, CNM 06/10/2016, 11:31 AM

## 2016-06-12 ENCOUNTER — Telehealth: Payer: Self-pay

## 2016-06-12 ENCOUNTER — Other Ambulatory Visit: Payer: Self-pay | Admitting: Obstetrics

## 2016-06-12 DIAGNOSIS — B3731 Acute candidiasis of vulva and vagina: Secondary | ICD-10-CM

## 2016-06-12 DIAGNOSIS — B373 Candidiasis of vulva and vagina: Secondary | ICD-10-CM

## 2016-06-12 MED ORDER — FLUCONAZOLE 150 MG PO TABS: 150.0000 mg | ORAL_TABLET | Freq: Once | ORAL | 0 refills | Status: AC

## 2016-06-12 NOTE — Telephone Encounter (Signed)
Diflucan Rx 

## 2016-06-12 NOTE — Telephone Encounter (Signed)
Pt called office states that she thinks that she is having a yeast infection. She is having a discharge, and vaginal itching. She just completed antibiotic for bacterial vaginosis, and is now having the itching. Pt is requesting yeast treatment.

## 2016-06-13 ENCOUNTER — Other Ambulatory Visit (HOSPITAL_COMMUNITY): Payer: Self-pay | Admitting: *Deleted

## 2016-06-13 ENCOUNTER — Ambulatory Visit (HOSPITAL_COMMUNITY)
Admission: RE | Admit: 2016-06-13 | Discharge: 2016-06-13 | Disposition: A | Discharge status: Home / Self Care | Payer: Medicaid Other | Source: Ambulatory Visit | Attending: Certified Nurse Midwife | Admitting: Certified Nurse Midwife

## 2016-06-13 ENCOUNTER — Encounter (HOSPITAL_COMMUNITY): Payer: Self-pay

## 2016-06-13 DIAGNOSIS — O30033 Twin pregnancy, monochorionic/diamniotic, third trimester: Secondary | ICD-10-CM

## 2016-06-13 DIAGNOSIS — O30039 Twin pregnancy, monochorionic/diamniotic, unspecified trimester: Secondary | ICD-10-CM

## 2016-06-13 DIAGNOSIS — Z3A33 33 weeks gestation of pregnancy: Secondary | ICD-10-CM | POA: Diagnosis not present

## 2016-06-13 DIAGNOSIS — O0993 Supervision of high risk pregnancy, unspecified, third trimester: Secondary | ICD-10-CM

## 2016-06-17 ENCOUNTER — Encounter: Payer: Self-pay | Admitting: Obstetrics

## 2016-06-17 ENCOUNTER — Ambulatory Visit (INDEPENDENT_AMBULATORY_CARE_PROVIDER_SITE_OTHER): Payer: Medicaid Other | Admitting: Obstetrics

## 2016-06-17 VITALS — BP 135/86 | HR 90 | Wt 331.4 lb

## 2016-06-17 DIAGNOSIS — O30039 Twin pregnancy, monochorionic/diamniotic, unspecified trimester: Secondary | ICD-10-CM

## 2016-06-17 DIAGNOSIS — O30033 Twin pregnancy, monochorionic/diamniotic, third trimester: Secondary | ICD-10-CM

## 2016-06-17 DIAGNOSIS — O0993 Supervision of high risk pregnancy, unspecified, third trimester: Secondary | ICD-10-CM

## 2016-06-17 NOTE — Progress Notes (Signed)
Subjective:  Jacqueline Clarke is a 31 y.o. [id number] at 7439w3d being seen today for ongoing prenatal care.  She is currently monitored for the following issues for this high-risk pregnancy and has Depression; Monochorionic diamniotic twin gestation; Supervision of high-risk pregnancy; Low vitamin D level; Anti-D antibodies present during pregnancy; Encounter for procreative genetic counseling; Post-concussion headache; Anemia affecting pregnancy in third trimester; and Symptomatic anemia on her problem list.  Patient reports no complaints.  Contractions: Not present. Vag. Bleeding: None.  Movement: Present. Denies leaking of fluid.   The following portions of the patient's history were reviewed and updated as appropriate: allergies, current medications, past family history, past medical history, past social history, past surgical history and problem list. Problem list updated.  Objective:   Vitals:   06/17/16 1500  BP: 135/86  Pulse: 90  Weight: (!) 331 lb 6.4 oz (150.3 kg)    Fetal Status: Fetal Heart Rate (bpm): NST   Movement: Present     General:  Alert, oriented and cooperative. Patient is in no acute distress.  Skin: Skin is warm and dry. No rash noted.   Cardiovascular: Normal heart rate noted  Respiratory: Normal respiratory effort, no problems with respiration noted  Abdomen: Soft, gravid, appropriate for gestational age. Pain/Pressure: Present     Pelvic:  Cervical exam deferred        Extremities: Normal range of motion.  Edema: Moderate pitting, indentation subsides rapidly  Mental Status: Normal mood and affect. Normal behavior. Normal judgment and thought content.   Urinalysis:      NST:  Reactive  Assessment and Plan:  Pregnancy: [id number] at 4639w3d  1. Supervision of high risk pregnancy in third trimester   2. Monochorionic diamniotic twin pregnancy, antepartum - weekly NST - delivery at 36-37 weeks  Preterm labor symptoms and general obstetric precautions  including but not limited to vaginal bleeding, contractions, leaking of fluid and fetal movement were reviewed in detail with the patient. Please refer to After Visit Summary for other counseling recommendations.  Return in about 1 week (around 06/24/2016) for ROB.   Brock BadHarper, [name], MDPatient ID: Jacqueline Clarke, female   DOB: 12-25-1985, 31 y.o.   MRN: [id number]

## 2016-06-17 NOTE — Progress Notes (Signed)
Patient reports good fetal movement and feels pressure, denies contractions and bleeding.

## 2016-06-20 ENCOUNTER — Other Ambulatory Visit (HOSPITAL_COMMUNITY): Payer: Self-pay | Admitting: Obstetrics and Gynecology

## 2016-06-20 ENCOUNTER — Encounter (HOSPITAL_COMMUNITY): Payer: Self-pay

## 2016-06-20 ENCOUNTER — Ambulatory Visit (HOSPITAL_COMMUNITY): Payer: Self-pay

## 2016-06-20 ENCOUNTER — Ambulatory Visit (HOSPITAL_COMMUNITY)
Admission: RE | Admit: 2016-06-20 | Discharge: 2016-06-20 | Disposition: A | Discharge status: Home / Self Care | Payer: Medicaid Other | Source: Ambulatory Visit | Attending: Certified Nurse Midwife | Admitting: Certified Nurse Midwife

## 2016-06-20 DIAGNOSIS — O99213 Obesity complicating pregnancy, third trimester: Secondary | ICD-10-CM

## 2016-06-20 DIAGNOSIS — O368132 Decreased fetal movements, third trimester, fetus 2: Secondary | ICD-10-CM | POA: Insufficient documentation

## 2016-06-20 DIAGNOSIS — Z3A34 34 weeks gestation of pregnancy: Secondary | ICD-10-CM | POA: Diagnosis present

## 2016-06-20 DIAGNOSIS — O09893 Supervision of other high risk pregnancies, third trimester: Secondary | ICD-10-CM | POA: Diagnosis not present

## 2016-06-20 DIAGNOSIS — O30039 Twin pregnancy, monochorionic/diamniotic, unspecified trimester: Secondary | ICD-10-CM

## 2016-06-20 DIAGNOSIS — O30033 Twin pregnancy, monochorionic/diamniotic, third trimester: Secondary | ICD-10-CM

## 2016-06-20 DIAGNOSIS — O163 Unspecified maternal hypertension, third trimester: Secondary | ICD-10-CM

## 2016-06-20 NOTE — Progress Notes (Signed)
Pt reports baby A having no fetal movement since 5/31 early morning.  Baby B has decreased movement.

## 2016-06-23 ENCOUNTER — Encounter (HOSPITAL_COMMUNITY)
Admission: AD | Disposition: A | Discharge status: Home / Self Care | Payer: Self-pay | Source: Ambulatory Visit | Attending: Obstetrics and Gynecology

## 2016-06-23 ENCOUNTER — Inpatient Hospital Stay (HOSPITAL_COMMUNITY): Payer: Medicaid Other | Admitting: Anesthesiology

## 2016-06-23 ENCOUNTER — Inpatient Hospital Stay (HOSPITAL_COMMUNITY)
Admission: AD | Admit: 2016-06-23 | Discharge: 2016-06-26 | DRG: 765 | Disposition: A | Discharge status: Home / Self Care | Payer: Medicaid Other | Source: Ambulatory Visit | Attending: Obstetrics and Gynecology | Admitting: Obstetrics and Gynecology

## 2016-06-23 ENCOUNTER — Encounter (HOSPITAL_COMMUNITY): Payer: Self-pay | Admitting: *Deleted

## 2016-06-23 DIAGNOSIS — Z6841 Body Mass Index (BMI) 40.0 and over, adult: Secondary | ICD-10-CM | POA: Diagnosis not present

## 2016-06-23 DIAGNOSIS — D649 Anemia, unspecified: Secondary | ICD-10-CM | POA: Diagnosis present

## 2016-06-23 DIAGNOSIS — Z6791 Unspecified blood type, Rh negative: Secondary | ICD-10-CM | POA: Diagnosis not present

## 2016-06-23 DIAGNOSIS — O99344 Other mental disorders complicating childbirth: Secondary | ICD-10-CM | POA: Diagnosis present

## 2016-06-23 DIAGNOSIS — O9902 Anemia complicating childbirth: Secondary | ICD-10-CM | POA: Diagnosis present

## 2016-06-23 DIAGNOSIS — Z8614 Personal history of Methicillin resistant Staphylococcus aureus infection: Secondary | ICD-10-CM

## 2016-06-23 DIAGNOSIS — O30033 Twin pregnancy, monochorionic/diamniotic, third trimester: Secondary | ICD-10-CM | POA: Diagnosis present

## 2016-06-23 DIAGNOSIS — O4443 Low lying placenta NOS or without hemorrhage, third trimester: Secondary | ICD-10-CM | POA: Diagnosis present

## 2016-06-23 DIAGNOSIS — Z3A35 35 weeks gestation of pregnancy: Secondary | ICD-10-CM

## 2016-06-23 DIAGNOSIS — O26893 Other specified pregnancy related conditions, third trimester: Secondary | ICD-10-CM | POA: Diagnosis present

## 2016-06-23 DIAGNOSIS — Z87891 Personal history of nicotine dependence: Secondary | ICD-10-CM

## 2016-06-23 DIAGNOSIS — Z7982 Long term (current) use of aspirin: Secondary | ICD-10-CM | POA: Diagnosis not present

## 2016-06-23 DIAGNOSIS — O99214 Obesity complicating childbirth: Secondary | ICD-10-CM | POA: Diagnosis present

## 2016-06-23 DIAGNOSIS — O26899 Other specified pregnancy related conditions, unspecified trimester: Secondary | ICD-10-CM | POA: Diagnosis present

## 2016-06-23 DIAGNOSIS — M419 Scoliosis, unspecified: Secondary | ICD-10-CM | POA: Diagnosis present

## 2016-06-23 DIAGNOSIS — Z98891 History of uterine scar from previous surgery: Secondary | ICD-10-CM

## 2016-06-23 DIAGNOSIS — F419 Anxiety disorder, unspecified: Secondary | ICD-10-CM | POA: Diagnosis present

## 2016-06-23 DIAGNOSIS — O4593 Premature separation of placenta, unspecified, third trimester: Secondary | ICD-10-CM | POA: Diagnosis not present

## 2016-06-23 DIAGNOSIS — O134 Gestational [pregnancy-induced] hypertension without significant proteinuria, complicating childbirth: Principal | ICD-10-CM | POA: Diagnosis present

## 2016-06-23 LAB — URINALYSIS, ROUTINE W REFLEX MICROSCOPIC
Bilirubin Urine: NEGATIVE
Glucose, UA: NEGATIVE mg/dL
Hgb urine dipstick: NEGATIVE
Ketones, ur: NEGATIVE mg/dL
Nitrite: NEGATIVE
PROTEIN: 30 mg/dL — AB
SPECIFIC GRAVITY, URINE: 1.027 (ref 1.005–1.030)
pH: 5 (ref 5.0–8.0)

## 2016-06-23 LAB — CBC
HCT: 31.5 % — ABNORMAL LOW (ref 36.0–46.0)
Hemoglobin: 10.1 g/dL — ABNORMAL LOW (ref 12.0–15.0)
MCH: 25.8 pg — ABNORMAL LOW (ref 26.0–34.0)
MCHC: 32.1 g/dL (ref 30.0–36.0)
MCV: 80.6 fL (ref 78.0–100.0)
PLATELETS: 217 10*3/uL (ref 150–400)
RBC: 3.91 MIL/uL (ref 3.87–5.11)
RDW: 16.7 % — AB (ref 11.5–15.5)
WBC: 8.3 10*3/uL (ref 4.0–10.5)

## 2016-06-23 LAB — TYPE AND SCREEN
ABO/RH(D): O NEG
ANTIBODY SCREEN: NEGATIVE

## 2016-06-23 LAB — GROUP B STREP BY PCR: Group B strep by PCR: NEGATIVE

## 2016-06-23 LAB — RPR: RPR Ser Ql: NONREACTIVE

## 2016-06-23 SURGERY — Surgical Case
Anesthesia: Epidural

## 2016-06-23 MED ORDER — PHENYLEPHRINE 40 MCG/ML (10ML) SYRINGE FOR IV PUSH (FOR BLOOD PRESSURE SUPPORT)
80.0000 ug | PREFILLED_SYRINGE | INTRAVENOUS | Status: DC | PRN
  Filled 2016-06-23: qty 10

## 2016-06-23 MED ORDER — ONDANSETRON HCL 4 MG/2ML IJ SOLN
INTRAMUSCULAR | Status: DC | PRN
  Administered 2016-06-23: 4 mg via INTRAVENOUS

## 2016-06-23 MED ORDER — EPHEDRINE 5 MG/ML INJ: 10.0000 mg | INTRAVENOUS | Status: DC | PRN

## 2016-06-23 MED ORDER — FLEET ENEMA 7-19 GM/118ML RE ENEM: 1.0000 | ENEMA | RECTAL | Status: DC | PRN

## 2016-06-23 MED ORDER — LACTATED RINGERS IV SOLN
INTRAVENOUS | Status: DC
  Administered 2016-06-23 (×3): via INTRAVENOUS

## 2016-06-23 MED ORDER — LACTATED RINGERS IV SOLN: 500.0000 mL | Freq: Once | INTRAVENOUS | Status: DC

## 2016-06-23 MED ORDER — ACETAMINOPHEN 325 MG PO TABS: 650.0000 mg | ORAL_TABLET | ORAL | Status: DC | PRN

## 2016-06-23 MED ORDER — FENTANYL 2.5 MCG/ML BUPIVACAINE 1/10 % EPIDURAL INFUSION (WH - ANES)
14.0000 mL/h | INTRAMUSCULAR | Status: DC | PRN
  Administered 2016-06-23: 14 mL/h via EPIDURAL
  Filled 2016-06-23: qty 100

## 2016-06-23 MED ORDER — OXYCODONE-ACETAMINOPHEN 5-325 MG PO TABS: 1.0000 | ORAL_TABLET | ORAL | Status: DC | PRN

## 2016-06-23 MED ORDER — PENICILLIN G POTASSIUM 5000000 UNITS IJ SOLR: 3.0000 10*6.[IU] | INTRAMUSCULAR | Status: DC

## 2016-06-23 MED ORDER — ONDANSETRON HCL 4 MG/2ML IJ SOLN: 4.0000 mg | Freq: Four times a day (QID) | INTRAMUSCULAR | Status: DC | PRN

## 2016-06-23 MED ORDER — BUTORPHANOL TARTRATE 1 MG/ML IJ SOLN
1.0000 mg | Freq: Once | INTRAMUSCULAR | Status: AC
  Administered 2016-06-23: 1 mg via INTRAVENOUS
  Filled 2016-06-23: qty 1

## 2016-06-23 MED ORDER — OXYCODONE-ACETAMINOPHEN 5-325 MG PO TABS
2.0000 | ORAL_TABLET | ORAL | Status: DC | PRN
Start: 2016-06-23 — End: 2016-06-23

## 2016-06-23 MED ORDER — MORPHINE SULFATE (PF) 0.5 MG/ML IJ SOLN
INTRAMUSCULAR | Status: AC
Start: 2016-06-23 — End: 2016-06-23
  Filled 2016-06-23: qty 10

## 2016-06-23 MED ORDER — LIDOCAINE HCL (PF) 1 % IJ SOLN
30.0000 mL | INTRAMUSCULAR | Status: DC | PRN
  Filled 2016-06-23: qty 30

## 2016-06-23 MED ORDER — PHENYLEPHRINE 40 MCG/ML (10ML) SYRINGE FOR IV PUSH (FOR BLOOD PRESSURE SUPPORT): 80.0000 ug | PREFILLED_SYRINGE | INTRAVENOUS | Status: DC | PRN

## 2016-06-23 MED ORDER — SODIUM BICARBONATE 8.4 % IV SOLN
INTRAVENOUS | Status: DC | PRN
  Administered 2016-06-23 (×3): 5 mL via EPIDURAL

## 2016-06-23 MED ORDER — OXYTOCIN 40 UNITS IN LACTATED RINGERS INFUSION - SIMPLE MED
2.5000 [IU]/h | INTRAVENOUS | Status: DC
  Filled 2016-06-23: qty 1000

## 2016-06-23 MED ORDER — DIPHENHYDRAMINE HCL 50 MG/ML IJ SOLN: 12.5000 mg | INTRAMUSCULAR | Status: DC | PRN

## 2016-06-23 MED ORDER — PENICILLIN G POTASSIUM 5000000 UNITS IJ SOLR
5.0000 10*6.[IU] | Freq: Once | INTRAVENOUS | Status: AC
  Administered 2016-06-23: 5 10*6.[IU] via INTRAVENOUS
  Filled 2016-06-23: qty 5

## 2016-06-23 MED ORDER — LACTATED RINGERS IV SOLN: 500.0000 mL | INTRAVENOUS | Status: DC | PRN

## 2016-06-23 MED ORDER — PHENYLEPHRINE HCL 10 MG/ML IJ SOLN
INTRAMUSCULAR | Status: DC | PRN
Start: 2016-06-23 — End: 2016-06-24
  Administered 2016-06-23 – 2016-06-24 (×3): 80 ug via INTRAVENOUS

## 2016-06-23 MED ORDER — DEXTROSE 5 % IV SOLN
INTRAVENOUS | Status: DC | PRN
  Administered 2016-06-23: 3 g via INTRAVENOUS

## 2016-06-23 MED ORDER — OXYTOCIN BOLUS FROM INFUSION: 500.0000 mL | Freq: Once | INTRAVENOUS | Status: DC

## 2016-06-23 MED ORDER — SOD CITRATE-CITRIC ACID 500-334 MG/5ML PO SOLN
30.0000 mL | ORAL | Status: DC | PRN
  Filled 2016-06-23: qty 15

## 2016-06-23 MED ORDER — OXYTOCIN 10 UNIT/ML IJ SOLN
INTRAMUSCULAR | Status: DC | PRN
  Administered 2016-06-23: 40 [IU] via INTRAMUSCULAR

## 2016-06-23 MED ORDER — LIDOCAINE HCL (PF) 1 % IJ SOLN
INTRAMUSCULAR | Status: DC | PRN
Start: 2016-06-23 — End: 2016-06-24
  Administered 2016-06-23: 13 mL via EPIDURAL

## 2016-06-23 MED ORDER — PENICILLIN G POTASSIUM 5000000 UNITS IJ SOLR: 5.0000 10*6.[IU] | Freq: Once | INTRAMUSCULAR | Status: DC

## 2016-06-23 MED ORDER — PENICILLIN G POT IN DEXTROSE 60000 UNIT/ML IV SOLN
3.0000 10*6.[IU] | INTRAVENOUS | Status: DC
  Administered 2016-06-23 (×3): 3 10*6.[IU] via INTRAVENOUS
  Filled 2016-06-23 (×7): qty 50

## 2016-06-23 SURGICAL SUPPLY — 38 items
CANISTER SUCT 3000ML PPV (MISCELLANEOUS) ×3 IMPLANT
CHLORAPREP W/TINT 26ML (MISCELLANEOUS) ×3 IMPLANT
DRAPE SHEET LG 3/4 BI-LAMINATE (DRAPES) ×3 IMPLANT
DRSG OPSITE POSTOP 4X10 (GAUZE/BANDAGES/DRESSINGS) ×3 IMPLANT
ELECT REM PT RETURN 9FT ADLT (ELECTROSURGICAL) ×3
ELECTRODE REM PT RTRN 9FT ADLT (ELECTROSURGICAL) ×2 IMPLANT
GLOVE BIOGEL PI IND STRL 7.0 (GLOVE) ×4 IMPLANT
GLOVE BIOGEL PI IND STRL 7.5 (GLOVE) ×2 IMPLANT
GLOVE BIOGEL PI INDICATOR 7.0 (GLOVE) ×5
GLOVE BIOGEL PI INDICATOR 7.5 (GLOVE) ×2
GLOVE ECLIPSE 7.5 STRL STRAW (GLOVE) ×3 IMPLANT
GLOVE SKINSENSE NS SZ7.0 (GLOVE) ×1
GLOVE SKINSENSE STRL SZ7.0 (GLOVE) ×2 IMPLANT
GLOVE SURG SS PI 7.0 STRL IVOR (GLOVE) ×1 IMPLANT
GLOVE SURG SS PI 7.5 STRL IVOR (GLOVE) ×2 IMPLANT
GOWN STRL REUS W/ TWL LRG LVL3 (GOWN DISPOSABLE) ×4 IMPLANT
GOWN STRL REUS W/ TWL XL LVL3 (GOWN DISPOSABLE) ×2 IMPLANT
GOWN STRL REUS W/TWL LRG LVL3 (GOWN DISPOSABLE) ×6
GOWN STRL REUS W/TWL XL LVL3 (GOWN DISPOSABLE) ×3
KIT ABG SYR 3ML LUER SLIP (SYRINGE) ×2 IMPLANT
NDL HYPO 25X5/8 SAFETYGLIDE (NEEDLE) IMPLANT
NEEDLE HYPO 25X5/8 SAFETYGLIDE (NEEDLE) ×6 IMPLANT
NS IRRIG 1000ML POUR BTL (IV SOLUTION) ×3 IMPLANT
PACK C SECTION WH (CUSTOM PROCEDURE TRAY) ×3 IMPLANT
PAD OB MATERNITY 4.3X12.25 (PERSONAL CARE ITEMS) ×3 IMPLANT
PAD PREP 24X48 CUFFED NSTRL (MISCELLANEOUS) ×3 IMPLANT
SPONGE LAP 18X18 X RAY DECT (DISPOSABLE) ×1 IMPLANT
STAPLE OSTEOSYNTHE COMPRESSION (GAUZE/BANDAGES/DRESSINGS) ×1 IMPLANT
STAPLER VISISTAT 35W (STAPLE) ×1 IMPLANT
SUT CHROMIC 1 CTX 36 (SUTURE) ×1 IMPLANT
SUT MON AB 4-0 PS1 27 (SUTURE) ×4 IMPLANT
SUT MON AB-0 CT1 36 (SUTURE) ×6 IMPLANT
SUT PLAIN 2 0 (SUTURE) ×3
SUT PLAIN ABS 2-0 CT1 27XMFL (SUTURE) ×2 IMPLANT
SUT VIC AB 0 CT1 36 (SUTURE) ×7 IMPLANT
SUT VIC AB 3-0 CT1 27 (SUTURE) ×6
SUT VIC AB 3-0 CT1 TAPERPNT 27 (SUTURE) ×2 IMPLANT
SUT VIC AB 4-0 KS 27 (SUTURE) ×1 IMPLANT

## 2016-06-23 NOTE — Anesthesia Pain Management Evaluation Note (Signed)
  CRNA Pain Management Visit Note  Patient: Jacqueline Clarke, 31 y.o., female  "Hello I am a [facility]. We have an anesthesia team available at all times to provide care throughout the hospital, including epidural management and anesthesia for C-section. I don't know your plan for the delivery whether it a natural birth, water birth, IV sedation, nitrous supplementation, doula or epidural, but we want to meet your pain goals."   1.Was your pain managed to your expectations on prior hospitalizations?   Yes   2.What is your expectation for pain management during this hospitalization?     Epidural  3.How can we help you reach that goal? epidural  Record the patient's initial score and the patient's pain goal.   Pain: 4  Pain Goal: 4 [facility] wants you to be able to say your pain was always managed very well.  Jacqueline Clarke 06/23/2016

## 2016-06-23 NOTE — Progress Notes (Signed)
FHR x 2 difficult to trace.  RN at bedside from 14:28 to 15:08 repositioning patient and adjusting US for both baby A and baby B.  MD made aware.

## 2016-06-23 NOTE — Anesthesia Procedure Notes (Signed)
Epidural Patient location during procedure: OB Start time: 06/23/2016 7:25 PM End time: 06/23/2016 7:42 PM  Staffing Anesthesiologist: Anitra LauthMILLER,  Cohick RAY Performed: anesthesiologist   Preanesthetic Checklist Completed: patient identified, site marked, surgical consent, pre-op evaluation, timeout performed, IV checked, risks and benefits discussed and monitors and equipment checked  Epidural Patient position: sitting Prep: DuraPrep Patient monitoring: heart rate, cardiac monitor, continuous pulse ox and blood pressure Approach: midline Location: L2-L3 Injection technique: LOR saline  Needle:  Needle type: Tuohy  Needle gauge: 17 G Needle length: 9 cm Needle insertion depth: 7 cm Catheter type: closed end flexible Catheter size: 20 Guage Catheter at skin depth: 11 cm Test dose: negative  Assessment Events: blood not aspirated, injection not painful, no injection resistance, negative IV test and no paresthesia  Additional Notes Reason for block:procedure for pain

## 2016-06-23 NOTE — MAU Provider Note (Signed)
History     CSN: [id number]  Arrival date and time: 06/23/16 0137  First Provider Initiated Contact with Patient 06/23/16 0214      Chief Complaint  Patient presents with  . Contractions   HPI Jacqueline Clarke is a 31 y.o. [id number] at [redacted]w[redacted]d with mono/di twins who presents with contractions & back pressure. Reports intermittent contractions since yesterday. Associated lower back pain/pressure x 3 hours. States pain occurs 5+ times per hour. Rates pain 10/10. Has not treated. Denies vaginal bleeding or LOF. Positive fetal movement. Cervix was dilated 0.5 cm 2 weeks ago.  OB History    Gravida Para Term Preterm AB Living   7 4 4  0 2 4   SAB TAB Ectopic Multiple Live Births   2 0 0   4      Past Medical History:  Diagnosis Date  . Anemia   . Anxiety   . Asthma   . Blood type, Rh negative   . Cholelithiasis   . Chronic back pain    scoliosis- on percocet  . Depression   . Headache(784.0)   . Hyperemesis complicating pregnancy, antepartum 2013  . 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 Obesity   . Pregnancy induced hypertension   . Scoliosis     Past Surgical History:  Procedure Laterality Date  . arm surgery    . CHOLECYSTECTOMY    . DILATION AND CURETTAGE OF UTERUS  2008  . MR WRIST RIGHT     sx to nerves  . NERVE, TENDON AND ARTERY REPAIR Right 06/18/2014   Procedure: NERVE, TENDON AND ARTERY REPAIR;  Surgeon: Bradly Bienenstock, MD;  Location: [facility] OR;  Service: Orthopedics;  Laterality: Right;  . TONSILLECTOMY AND ADENOIDECTOMY      Family History  Problem Relation Age of Onset  . Heart disease Maternal Grandfather   . Diabetes Maternal Grandfather   . Heart disease Father   . Anesthesia problems Neg Hx     Social History  Substance Use Topics  . Smoking status: Former Smoker    Packs/day: 0.50    Types: Cigarettes  . Smokeless tobacco: Never Used  . Alcohol use No     Comment: Occassionally    Allergies: No Known Allergies  Prescriptions  Prior to Admission  Medication Sig Dispense Refill Last Dose  . acetaminophen (TYLENOL) 325 MG tablet Take 650 mg by mouth every 6 (six) hours as needed for moderate pain.   06/22/2016 at Unknown time  . aspirin EC 81 MG tablet Take 1 tablet (81 mg total) by mouth daily. Take after 12 weeks for prevention of preeclampsia later in pregnancy 300 tablet 2 06/22/2016 at Unknown time  . Doxylamine-Pyridoxine (DICLEGIS) 10-10 MG TBEC Take 2 tablets by mouth at bedtime. If symptoms persist, add one tablet in the morning and one in the afternoon 30 tablet 1 06/22/2016 at Unknown time  . Prenatal Vit-Fe Fumarate-FA (PRENATAL MULTIVITAMIN) TABS tablet Take 1 tablet by mouth daily at 12 noon. 30 tablet 6 06/22/2016 at Unknown time  . sertraline (ZOLOFT) 50 MG tablet Take 1 tablet (50 mg total) by mouth daily. Take 1/2 tablet qd x 7 days and then increase to 1 whole tablet qd (Patient taking differently: Take 50 mg by mouth at bedtime. ) 30 tablet 2 06/22/2016 at Unknown time  . cyclobenzaprine (FLEXERIL) 10 MG tablet Take 1 tablet by mouth 3 (three) times daily as needed for muscle spasms.   3 Unknown at Unknown  time    Review of Systems  Constitutional: Negative.   Eyes: Negative for visual disturbance.  Cardiovascular: Positive for leg swelling.  Gastrointestinal: Positive for abdominal pain.  Genitourinary: Negative for vaginal bleeding and vaginal discharge.  Musculoskeletal: Positive for back pain.  Neurological: Negative for headaches.   Physical Exam   Blood pressure 125/80, pulse 91, temperature 98.3 F (36.8 C), temperature source Oral, resp. rate 19, height 5\' 11"  (1.803 m), weight (!) 339 lb (153.8 kg), last menstrual period 10/20/2015, currently breastfeeding.  Physical Exam  Nursing note and vitals reviewed. Constitutional: She is oriented to person, place, and time. She appears well-developed and well-nourished. No distress.  HENT:  Head: Normocephalic and atraumatic.  Eyes: Conjunctivae are  normal. Right eye exhibits no discharge. Left eye exhibits no discharge. No scleral icterus.  Neck: Normal range of motion.  Respiratory: Effort normal. No respiratory distress.  Musculoskeletal: She exhibits edema (BLE, 3+ pitting).  Neurological: She is alert and oriented to person, place, and time.  Skin: Skin is warm and dry. She is not diaphoretic.  Psychiatric: She has a normal mood and affect. Her behavior is normal. Judgment and thought content normal.   Dilation: 4 Effacement (%): 80 Cervical Position: Middle Station: -3 Presentation: Vertex Exam by:: Judeth HornErin Rehanna Oloughlin NP  Fetal Tracing: Baby A Baseline: 120 Variability: moderate Accelerations: 15x15 Decelerations: none  Baby B Baseline: 135 Variability: moderate Accelerations:15x15 Decelerations: none  Toco: difficult to assess, TOCO adjusted, Q2-5 min MAU Course  Procedures Results for orders placed or performed during the hospital encounter of 06/23/16 (from the past 24 hour(s))  Urinalysis, Routine w reflex microscopic     Status: Abnormal   Collection Time: 06/23/16  1:05 AM  Result Value Ref Range   Color, Urine AMBER (A) YELLOW   APPearance HAZY (A) CLEAR   Specific Gravity, Urine 1.027 1.005 - 1.030   pH 5.0 5.0 - 8.0   Glucose, UA NEGATIVE NEGATIVE mg/dL   Hgb urine dipstick NEGATIVE NEGATIVE   Bilirubin Urine NEGATIVE NEGATIVE   Ketones, ur NEGATIVE NEGATIVE mg/dL   Protein, ur 30 (A) NEGATIVE mg/dL   Nitrite NEGATIVE NEGATIVE   Leukocytes, UA MODERATE (A) NEGATIVE   RBC / HPF 6-30 0 - 5 RBC/hpf   WBC, UA 6-30 0 - 5 WBC/hpf   Bacteria, UA RARE (A) NONE SEEN   Squamous Epithelial / LPF 6-30 (A) NONE SEEN   Mucous PRESENT     MDM Reactive NST Will monitor x 1 hr & recheck SVE SVE changed from tight 3/60% to 4/80%. Patient reports increase in contraction intensity & frequency. Discussed patient with Dr. Jolayne Pantheronstant. Will admit to birthing suites.  Assessment and Plan  A: 1. Indication for care in  labor and delivery, antepartum   2. Monochorionic diamniotic twin gestation in third trimester    P: Admit to birthing suites Report given to [facility]amb[facility] CNM  Judeth Hornrin Antoninette Lerner 06/23/2016, 2:13 AM

## 2016-06-23 NOTE — Anesthesia Preprocedure Evaluation (Signed)
Anesthesia Evaluation  Patient identified by MRN, date of birth, ID band Patient awake    Reviewed: Allergy & Precautions, Patient's Chart, lab work & pertinent test results  Airway Mallampati: III  TM Distance: >3 FB     Dental   Pulmonary asthma , former smoker,    Pulmonary exam normal        Cardiovascular hypertension, negative cardio ROS Normal cardiovascular exam     Neuro/Psych Anxiety Depression negative neurological ROS     GI/Hepatic negative GI ROS, Neg liver ROS,   Endo/Other  Morbid obesity  Renal/GU negative Renal ROS     Musculoskeletal   Abdominal   Peds  Hematology  (+) anemia ,   Anesthesia Other Findings   Reproductive/Obstetrics (+) Pregnancy                             Lab Results  Component Value Date   WBC 8.3 06/23/2016   HGB 10.1 (L) 06/23/2016   HCT 31.5 (L) 06/23/2016   MCV 80.6 06/23/2016   PLT 217 06/23/2016    Anesthesia Physical  Anesthesia Plan  ASA: III  Anesthesia Plan: Epidural   Post-op Pain Management:    Induction:   PONV Risk Score and Plan:   Airway Management Planned: Natural Airway  Additional Equipment:   Intra-op Plan:   Post-operative Plan:   Informed Consent: I have reviewed the patients History and Physical, chart, labs and discussed the procedure including the risks, benefits and alternatives for the proposed anesthesia with the patient or authorized representative who has indicated his/her understanding and acceptance.     Plan Discussed with:   Anesthesia Plan Comments:         Anesthesia Quick Evaluation

## 2016-06-23 NOTE — Progress Notes (Signed)
Patient standing at bedside from 10:55 to 11:08.  Difficulty tracing FHR x2.  RN x 2 at bedside adjusting US and assessing FHR x 2.

## 2016-06-23 NOTE — MAU Note (Signed)
Pt reports a lot of lower back pain and "pressure in back" that started about 3 hours ago. Pt thinks she is having contractions. Pt denies LOF or vaginal bleeding. Reports good fetal movement.

## 2016-06-23 NOTE — Progress Notes (Signed)
Labor Progress Note  Earleen Reaperrachelle L Broden is a 31 y.o. [id number] at 3167w2d  admitted for PTL, gHTN  S: Patient seen and examined at bedside. She rests comfortably in the chair. States her pain is controlled at this time.   O:  BP 131/87   Pulse 88   Temp 98.4 F (36.9 C) (Oral)   Resp 18   Ht 5\' 11"  (1.803 m)   Wt (!) 153.3 kg (338 lb)   LMP 10/20/2015 (Approximate)   BMI 47.14 kg/m   No intake/output data recorded.  FHT:   Fetus A: 130 bpm, mod variability, accels no decels Fetus B: 130 bmp, mod variability, accels, no decels UC:   irregular, every 4 minutes SVE:   Dilation: 5 Effacement (%): 80 Station: -3 Exam by:: Steffanie RainwaterNick Schenk, MD   Labs: Lab Results  Component Value Date   WBC 8.3 06/23/2016   HGB 10.1 (L) 06/23/2016   HCT 31.5 (L) 06/23/2016   MCV 80.6 06/23/2016   PLT 217 06/23/2016    Assessment / Plan: 31 y.o. [id number] 4867w2d in early/active labor  Labor: Progressing normally Fetal Wellbeing:  Category I, Category I Pain Control:  IV pain meds Anticipated MOD:  NSVD  Expectant management  Howard PouchLauren Quentyn Kolbeck, MD PGY-1 [facility]

## 2016-06-23 NOTE — Progress Notes (Signed)
Between 0803 to 0824 patient requesting to stand.  Difficulty tracing FHR.  RN x2 at bedside assessing and adjusting US.

## 2016-06-23 NOTE — Progress Notes (Signed)
S: 31 year old [id number] presenting for PTL with gHTN. Patient currently comfortable.  Dilation: 4 Effacement (%): 50 Cervical Position: Middle Station: -3 Presentation: Vertex Exam by:: Janine OresSusie Nix, RN   FHT A: 130 bpm, mod var, +accels, no decels FHT B: 130 bpm, mod var, +accels, no decels TOCO: occasional   A/P: Pain: well controlled Labor: progressing normally FWB: Category I, Category I  Continue expectant management Anticipate SVD

## 2016-06-23 NOTE — Progress Notes (Signed)
Patient seen. Doing well. No complaints. Comfortable after 1mg  of stadol.   Will recheck in a few hours.  Fetus A: 130 mod var Fetus B: 135 mod var  A/P expectant management at this time. Will recheck in a few hours.

## 2016-06-23 NOTE — MAU Note (Signed)
Pt presents to MAU c/o swelling and back "labor" pt states this started about 3 hours ago. Pt c/o swelling in her feet. Upon assessment the pt has severely swollen feet. +4 Pitting edema is present and is painful to the touch. Pt states having loose stools for the last few weeks as well but denies any weight loss.

## 2016-06-23 NOTE — H&P (Signed)
Jacqueline Clarke is a 31 y.o. female [phone] with IUP at [redacted]w[redacted]d with mono/di twins dated by LMP presenting for contractions. Pt states she has been having regular, every 10 minutes, since midnight. Rates pain 10/10. Contractions have worsened & become more frequent during MAU visit. Denies vaginal bleeding or LOF. Positive fetal movement. Has received routine prenatal care at [facility] since [redacted] weeks gestation.  Received BMZ on 4/3 & 4/5. Gestational hypertension diagnosed during MAU visit last month; blood transfusion given during that visit for symptomatic anemia.   Patient Active Problem List   Diagnosis Date Noted  . Symptomatic anemia 06/02/2016  . Anemia affecting pregnancy in third trimester 05/22/2016  . Post-concussion headache 04/23/2016  . Encounter for procreative genetic counseling   . Low vitamin D level 01/24/2016  . 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 Supervision of high-risk pregnancy 12/26/2015  . Monochorionic diamniotic twin gestation 12/19/2015  . Depression 07/09/2015    Prenatal History/Complications:  Past Medical History: Past Medical History:  Diagnosis Date  . Anemia   . Anxiety   . Asthma   . Blood type, Rh negative   . Cholelithiasis   . Chronic back pain    scoliosis- on percocet  . Depression   . Headache(784.0)   . Hyperemesis complicating pregnancy, antepartum 2013  . 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 Obesity   . Pregnancy induced hypertension   . Scoliosis     Past Surgical History: Past Surgical History:  Procedure Laterality Date  . arm surgery    . CHOLECYSTECTOMY    . DILATION AND CURETTAGE OF UTERUS  2008  . MR WRIST RIGHT     sx to nerves  . NERVE, TENDON AND ARTERY REPAIR Right 06/18/2014   Procedure: NERVE, TENDON AND ARTERY REPAIR;  Surgeon: Bradly Bienenstock, MD;  Location: [facility] OR;  Service: Orthopedics;  Laterality: Right;  . TONSILLECTOMY AND ADENOIDECTOMY      Obstetrical History: OB  History    Gravida Para Term Preterm AB Living   7 4 4  0 2 4   SAB TAB Ectopic Multiple Live Births   2 0 0   4      Social History: Social History   Social History  . Marital status: Single    Spouse name: N/A  . Number of children: N/A  . Years of education: N/A   Social History Main Topics  . Smoking status: Former Smoker    Packs/day: 0.50    Types: Cigarettes  . Smokeless tobacco: Never Used  . Alcohol use No     Comment: Occassionally  . Drug use: No  . Sexual activity: Yes    Birth control/ protection: None     Comment: nexplanon being removed 08-21-14   Other Topics Concern  . None   Social History Narrative   ** Merged History Encounter **       ** Merged History Encounter **        Family History: Family History  Problem Relation Age of Onset  . Heart disease Maternal Grandfather   . Diabetes Maternal Grandfather   . Heart disease Father   . Anesthesia problems Neg Hx     Allergies: No Known Allergies  Prescriptions Prior to Admission  Medication Sig Dispense Refill Last Dose  . acetaminophen (TYLENOL) 325 MG tablet Take 650 mg by mouth every 6 (six) hours as needed for moderate pain.   06/22/2016 at Unknown time  . aspirin  EC 81 MG tablet Take 1 tablet (81 mg total) by mouth daily. Take after 12 weeks for prevention of preeclampsia later in pregnancy 300 tablet 2 06/22/2016 at Unknown time  . Doxylamine-Pyridoxine (DICLEGIS) 10-10 MG TBEC Take 2 tablets by mouth at bedtime. If symptoms persist, add one tablet in the morning and one in the afternoon 30 tablet 1 06/22/2016 at Unknown time  . Prenatal Vit-Fe Fumarate-FA (PRENATAL MULTIVITAMIN) TABS tablet Take 1 tablet by mouth daily at 12 noon. 30 tablet 6 06/22/2016 at Unknown time  . sertraline (ZOLOFT) 50 MG tablet Take 1 tablet (50 mg total) by mouth daily. Take 1/2 tablet qd x 7 days and then increase to 1 whole tablet qd (Patient taking differently: Take 50 mg by mouth at bedtime. ) 30 tablet 2 06/22/2016  at Unknown time  . cyclobenzaprine (FLEXERIL) 10 MG tablet Take 1 tablet by mouth 3 (three) times daily as needed for muscle spasms.   3 Unknown at Unknown time    Review of Systems - History obtained from the patient General ROS: negative Cardiovascular ROS: positive for - edema Gastrointestinal ROS: positive for - abdominal pain Genito-Urinary ROS: negative for - vaginal bleeding or LOF  Blood pressure 125/80, pulse 91, temperature 98.3 F (36.8 C), temperature source Oral, resp. rate 19, height 5\' 11"  (1.803 m), weight (!) 339 lb (153.8 kg), last menstrual period 10/20/2015, currently breastfeeding. Physical Exam  Constitutional: She is oriented to person, place, and time. She appears well-developed and well-nourished. No distress.  HENT:  Head: Normocephalic and atraumatic.  Pulmonary/Chest: Effort normal. No respiratory distress.  Musculoskeletal: She exhibits edema (BLE, 3+ pitting).  Neurological: She is alert and oriented to person, place, and time.  Skin: Skin is warm and dry. She is not diaphoretic.  Psychiatric: She has a normal mood and affect. Her behavior is normal. Judgment and thought content normal.  Nursing note and vitals reviewed.  Dilation: 4 Effacement (%): 80 Station: -3 Exam by:: Judeth HornErin Cambridge Deleo NP  Fetal Tracing: Baby A Baseline: 120 Variability: moderate Accelerations: 15x15 Decelerations: none  Baby B Baseline: 135 Variability: moderate Accelerations:15x15 Decelerations: none  Toco: Q2-5 min, difficult to trace, TOCO adjusted  Prenatal Transfer Tool  Maternal Diabetes: No Genetic Screening: Normal Maternal Ultrasounds/Referrals: Normal Fetal Ultrasounds or other Referrals:  Fetal echo Maternal Substance Abuse:  No Significant Maternal Medications:  Meds include: Zoloft Significant Maternal Lab Results: Lab values include: Rh negative   Assessment: 1. Labor: preterm. SVE changed from 3/60% to 4/80% during MAU encounter 2. Fetal Wellbeing:  Category 1  3. Pain Control: undecided 4. GBS: unknown 5. 3248w2d week twin IUP  Plan:  1. Admit to BS per consult with Dr. Jolayne Pantheronstant 2. Routine L&D orders 3. Analgesia/anesthesia PRN   Judeth HornLawrence, Netty Sullivant, NP 06/23/2016, 5:16 AM

## 2016-06-24 ENCOUNTER — Encounter (HOSPITAL_COMMUNITY): Payer: Self-pay

## 2016-06-24 ENCOUNTER — Encounter: Payer: Medicaid Other | Admitting: Obstetrics and Gynecology

## 2016-06-24 DIAGNOSIS — Z3A35 35 weeks gestation of pregnancy: Secondary | ICD-10-CM

## 2016-06-24 DIAGNOSIS — O30033 Twin pregnancy, monochorionic/diamniotic, third trimester: Secondary | ICD-10-CM

## 2016-06-24 DIAGNOSIS — O4593 Premature separation of placenta, unspecified, third trimester: Secondary | ICD-10-CM

## 2016-06-24 DIAGNOSIS — O4443 Low lying placenta NOS or without hemorrhage, third trimester: Secondary | ICD-10-CM

## 2016-06-24 LAB — CBC
HEMATOCRIT: 28.3 % — AB (ref 36.0–46.0)
Hemoglobin: 9.1 g/dL — ABNORMAL LOW (ref 12.0–15.0)
MCH: 26.5 pg (ref 26.0–34.0)
MCHC: 32.2 g/dL (ref 30.0–36.0)
MCV: 82.3 fL (ref 78.0–100.0)
Platelets: 180 10*3/uL (ref 150–400)
RBC: 3.44 MIL/uL — AB (ref 3.87–5.11)
RDW: 16.8 % — AB (ref 11.5–15.5)
WBC: 10 10*3/uL (ref 4.0–10.5)

## 2016-06-24 MED ORDER — IBUPROFEN 600 MG PO TABS
600.0000 mg | ORAL_TABLET | Freq: Four times a day (QID) | ORAL | Status: DC
  Administered 2016-06-24 – 2016-06-26 (×9): 600 mg via ORAL
  Filled 2016-06-24 (×9): qty 1

## 2016-06-24 MED ORDER — HYDROMORPHONE HCL 1 MG/ML IJ SOLN
INTRAMUSCULAR | Status: AC
  Filled 2016-06-24: qty 1

## 2016-06-24 MED ORDER — CEFAZOLIN SODIUM 10 G IJ SOLR
INTRAMUSCULAR | Status: AC
  Filled 2016-06-24: qty 3000

## 2016-06-24 MED ORDER — PRENATAL MULTIVITAMIN CH
1.0000 | ORAL_TABLET | Freq: Every day | ORAL | Status: DC
  Administered 2016-06-24 – 2016-06-26 (×3): 1 via ORAL
  Filled 2016-06-24 (×3): qty 1

## 2016-06-24 MED ORDER — DEXTROSE 5 % IV SOLN
1.0000 ug/kg/h | INTRAVENOUS | Status: DC | PRN
  Filled 2016-06-24: qty 2

## 2016-06-24 MED ORDER — NALBUPHINE HCL 10 MG/ML IJ SOLN: 5.0000 mg | Freq: Once | INTRAMUSCULAR | Status: DC | PRN

## 2016-06-24 MED ORDER — MEPERIDINE HCL 25 MG/ML IJ SOLN
INTRAMUSCULAR | Status: AC
  Filled 2016-06-24: qty 1

## 2016-06-24 MED ORDER — NALBUPHINE HCL 10 MG/ML IJ SOLN
5.0000 mg | INTRAMUSCULAR | Status: DC | PRN
Start: 2016-06-24 — End: 2016-06-26
  Administered 2016-06-24: 5 mg via SUBCUTANEOUS
  Filled 2016-06-24: qty 1

## 2016-06-24 MED ORDER — SODIUM CHLORIDE 0.9% FLUSH
3.0000 mL | INTRAVENOUS | Status: DC | PRN
  Administered 2016-06-25: 3 mL via INTRAVENOUS
  Filled 2016-06-24: qty 3

## 2016-06-24 MED ORDER — LACTATED RINGERS IV SOLN
INTRAVENOUS | Status: DC | PRN
  Administered 2016-06-24: via INTRAVENOUS

## 2016-06-24 MED ORDER — SCOPOLAMINE 1 MG/3DAYS TD PT72
1.0000 | MEDICATED_PATCH | Freq: Once | TRANSDERMAL | Status: DC
  Administered 2016-06-24: 1.5 mg via TRANSDERMAL
  Filled 2016-06-24: qty 1

## 2016-06-24 MED ORDER — OXYTOCIN 40 UNITS IN LACTATED RINGERS INFUSION - SIMPLE MED: 2.5000 [IU]/h | INTRAVENOUS | Status: AC

## 2016-06-24 MED ORDER — DIPHENHYDRAMINE HCL 25 MG PO CAPS
25.0000 mg | ORAL_CAPSULE | ORAL | Status: DC | PRN
  Administered 2016-06-24 (×2): 25 mg via ORAL
  Filled 2016-06-24: qty 1

## 2016-06-24 MED ORDER — KETOROLAC TROMETHAMINE 30 MG/ML IJ SOLN
INTRAMUSCULAR | Status: AC
  Filled 2016-06-24: qty 1

## 2016-06-24 MED ORDER — SENNOSIDES-DOCUSATE SODIUM 8.6-50 MG PO TABS: 2.0000 | ORAL_TABLET | Freq: Every evening | ORAL | Status: DC | PRN

## 2016-06-24 MED ORDER — COCONUT OIL OIL: 1.0000 "application " | TOPICAL_OIL | Status: DC | PRN

## 2016-06-24 MED ORDER — PROMETHAZINE HCL 25 MG/ML IJ SOLN: 6.2500 mg | INTRAMUSCULAR | Status: DC | PRN

## 2016-06-24 MED ORDER — IBUPROFEN 600 MG PO TABS: 600.0000 mg | ORAL_TABLET | Freq: Four times a day (QID) | ORAL | Status: DC

## 2016-06-24 MED ORDER — TETANUS-DIPHTH-ACELL PERTUSSIS 5-2.5-18.5 LF-MCG/0.5 IM SUSP
0.5000 mL | Freq: Once | INTRAMUSCULAR | Status: AC
  Administered 2016-06-25: 0.5 mL via INTRAMUSCULAR
  Filled 2016-06-24: qty 0.5

## 2016-06-24 MED ORDER — KETOROLAC TROMETHAMINE 30 MG/ML IJ SOLN
30.0000 mg | Freq: Four times a day (QID) | INTRAMUSCULAR | Status: AC | PRN
  Administered 2016-06-24: 30 mg via INTRAMUSCULAR

## 2016-06-24 MED ORDER — OXYCODONE HCL 5 MG PO TABS: 5.0000 mg | ORAL_TABLET | Freq: Once | ORAL | Status: DC | PRN

## 2016-06-24 MED ORDER — RHO D IMMUNE GLOBULIN 1500 UNIT/2ML IJ SOSY
300.0000 ug | PREFILLED_SYRINGE | Freq: Once | INTRAMUSCULAR | Status: AC
  Administered 2016-06-24: 300 ug via INTRAVENOUS
  Filled 2016-06-24: qty 2

## 2016-06-24 MED ORDER — DIPHENHYDRAMINE HCL 50 MG/ML IJ SOLN
12.5000 mg | INTRAMUSCULAR | Status: DC | PRN
  Filled 2016-06-24: qty 1

## 2016-06-24 MED ORDER — NALOXONE HCL 0.4 MG/ML IJ SOLN: 0.4000 mg | INTRAMUSCULAR | Status: DC | PRN

## 2016-06-24 MED ORDER — SCOPOLAMINE 1 MG/3DAYS TD PT72
MEDICATED_PATCH | TRANSDERMAL | Status: AC
  Filled 2016-06-24: qty 1

## 2016-06-24 MED ORDER — SIMETHICONE 80 MG PO CHEW
80.0000 mg | CHEWABLE_TABLET | Freq: Three times a day (TID) | ORAL | Status: DC
  Administered 2016-06-24 – 2016-06-26 (×6): 80 mg via ORAL
  Filled 2016-06-24 (×7): qty 1

## 2016-06-24 MED ORDER — DIPHENHYDRAMINE HCL 25 MG PO CAPS
25.0000 mg | ORAL_CAPSULE | Freq: Four times a day (QID) | ORAL | Status: DC | PRN
  Filled 2016-06-24: qty 1

## 2016-06-24 MED ORDER — ACETAMINOPHEN 325 MG PO TABS
650.0000 mg | ORAL_TABLET | ORAL | Status: DC | PRN
  Administered 2016-06-24: 650 mg via ORAL
  Filled 2016-06-24: qty 2

## 2016-06-24 MED ORDER — KETOROLAC TROMETHAMINE 30 MG/ML IJ SOLN: 30.0000 mg | Freq: Four times a day (QID) | INTRAMUSCULAR | Status: AC | PRN

## 2016-06-24 MED ORDER — MORPHINE SULFATE (PF) 10 MG/ML IV SOLN
INTRAVENOUS | Status: DC | PRN
  Administered 2016-06-23: 4 mg via BUCCAL
  Administered 2016-06-24: 1 mg via INTRAVENOUS

## 2016-06-24 MED ORDER — NALBUPHINE HCL 10 MG/ML IJ SOLN
5.0000 mg | INTRAMUSCULAR | Status: DC | PRN
Start: 2016-06-24 — End: 2016-06-26
  Administered 2016-06-24: 5 mg via INTRAVENOUS
  Filled 2016-06-24: qty 1

## 2016-06-24 MED ORDER — SCOPOLAMINE 1 MG/3DAYS TD PT72
MEDICATED_PATCH | TRANSDERMAL | Status: DC | PRN
  Administered 2016-06-24: 1 via TRANSDERMAL

## 2016-06-24 MED ORDER — LIDOCAINE-EPINEPHRINE (PF) 2 %-1:200000 IJ SOLN
INTRAMUSCULAR | Status: AC
  Filled 2016-06-24: qty 20

## 2016-06-24 MED ORDER — SODIUM CHLORIDE 0.9 % IV SOLN
3.0000 g | Freq: Once | INTRAVENOUS | Status: AC
  Administered 2016-06-24: 3 g via INTRAVENOUS
  Filled 2016-06-24: qty 3

## 2016-06-24 MED ORDER — HYDROMORPHONE HCL 1 MG/ML IJ SOLN
0.2500 mg | INTRAMUSCULAR | Status: DC | PRN
  Administered 2016-06-24 (×4): 0.5 mg via INTRAVENOUS

## 2016-06-24 MED ORDER — ONDANSETRON HCL 4 MG/2ML IJ SOLN: 4.0000 mg | Freq: Three times a day (TID) | INTRAMUSCULAR | Status: DC | PRN

## 2016-06-24 MED ORDER — DIBUCAINE 1 % RE OINT: 1.0000 "application " | TOPICAL_OINTMENT | RECTAL | Status: DC | PRN

## 2016-06-24 MED ORDER — MEPERIDINE HCL 25 MG/ML IJ SOLN
6.2500 mg | INTRAMUSCULAR | Status: DC | PRN
  Administered 2016-06-24: 6.25 mg via INTRAVENOUS

## 2016-06-24 MED ORDER — OXYTOCIN 10 UNIT/ML IJ SOLN
INTRAMUSCULAR | Status: AC
Start: 2016-06-24 — End: 2016-06-24
  Filled 2016-06-24: qty 4

## 2016-06-24 MED ORDER — LACTATED RINGERS IV SOLN
INTRAVENOUS | Status: DC
  Administered 2016-06-24: 03:00:00 via INTRAVENOUS

## 2016-06-24 MED ORDER — OXYCODONE HCL 5 MG PO TABS: 5.0000 mg | ORAL_TABLET | ORAL | Status: DC | PRN

## 2016-06-24 MED ORDER — MENTHOL 3 MG MT LOZG: 1.0000 | LOZENGE | OROMUCOSAL | Status: DC | PRN

## 2016-06-24 MED ORDER — ONDANSETRON HCL 4 MG/2ML IJ SOLN
INTRAMUSCULAR | Status: AC
  Filled 2016-06-24: qty 2

## 2016-06-24 MED ORDER — OXYCODONE HCL 5 MG/5ML PO SOLN: 5.0000 mg | Freq: Once | ORAL | Status: DC | PRN

## 2016-06-24 MED ORDER — WITCH HAZEL-GLYCERIN EX PADS: 1.0000 "application " | MEDICATED_PAD | CUTANEOUS | Status: DC | PRN

## 2016-06-24 MED ORDER — PHENYLEPHRINE 40 MCG/ML (10ML) SYRINGE FOR IV PUSH (FOR BLOOD PRESSURE SUPPORT)
PREFILLED_SYRINGE | INTRAVENOUS | Status: AC
  Filled 2016-06-24: qty 20

## 2016-06-24 MED ORDER — FERROUS SULFATE 325 (65 FE) MG PO TABS
325.0000 mg | ORAL_TABLET | Freq: Two times a day (BID) | ORAL | Status: DC
  Administered 2016-06-25 (×2): 325 mg via ORAL
  Filled 2016-06-24 (×3): qty 1

## 2016-06-24 MED ORDER — OXYCODONE HCL 5 MG PO TABS
10.0000 mg | ORAL_TABLET | ORAL | Status: DC | PRN
  Administered 2016-06-24 – 2016-06-26 (×9): 10 mg via ORAL
  Filled 2016-06-24 (×9): qty 2

## 2016-06-24 NOTE — Anesthesia Postprocedure Evaluation (Signed)
Anesthesia Post Note  Patient: Jacqueline Clarke  Procedure(s) Performed: Procedure(s) (LRB): CESAREAN SECTION (N/A) VAGINAL DELIVERY     Patient location during evaluation: PACU Anesthesia Type: Epidural Level of consciousness: awake and alert Pain management: pain level controlled Vital Signs Assessment: post-procedure vital signs reviewed and stable Respiratory status: spontaneous breathing, nonlabored ventilation and respiratory function stable Cardiovascular status: stable and blood pressure returned to baseline Anesthetic complications: no    Last Vitals:  Vitals:   06/24/16 0130 06/24/16 0145  BP: 124/84 109/77  Pulse: 73 75  Resp: 17 17  Temp: 36.9 C     Last Pain:  Vitals:   06/24/16 0145  TempSrc:   PainSc: 10-Worst pain ever   Pain Goal: Patients Stated Pain Goal: 2 (06/23/16 [id number])               Lowella CurbWarren Ray Puneet Masoner

## 2016-06-24 NOTE — Progress Notes (Signed)
Daily Post Partum Note  Jacqueline Clarke is a 31 y.o. [id number] PPD#1 s/p  Fetus A SVD/intact perineum  @ [redacted]w[redacted]d  POD#1 s/p urgent pLTCS @ [redacted]w[redacted]d  For fetus B.  Pregnancy c/b mo-di twins, Rh neg, BMI 47 24hr/overnight events:  none  Subjective:  Tired, but otherwise feels well and w/o chest pain, sob, nausea, vomiting, fevers, chills. Foley still in. No s/s pre-x  Objective:    Current Vital Signs 24h Vital Sign Ranges  T 98.4 F (36.9 C) Temp  Avg: 98.4 F (36.9 C)  Min: 97.9 F (36.6 C)  Max: 99.2 F (37.3 C)  BP (!) 141/75 BP  Min: 0/0  Max: 141/75  HR 97 Pulse  Avg: 88.3  Min: 70  Max: 165  RR 20 Resp  Avg: 17.7  Min: 12  Max: 25  SaO2 96 % RA SpO2  Avg: 95.7 %  Min: 83 %  Max: 99 %       24 Hour I/O Current Shift I/O  Time Ins Outs 06/04 0701 - 06/05 0700 In: 1940 [P.O.:240; I.V.:1700] Out: 1425 [Urine:425] 06/04 1901 - 06/05 0700 In: 1940 [P.O.:240; I.V.:1700] Out: 1425 [Urine:425]   Patient Vitals for the past 6 hrs:  BP Temp Temp src Pulse Resp SpO2  06/24/16 0400 (!) 141/75 98.4 F (36.9 C) Oral 97 20 96 %  06/24/16 0258 (!) 134/95 99.2 F (37.3 C) Oral 96 16 95 %  06/24/16 0220 135/83 - - (!) 106 12 (!) 89 %  06/24/16 0215 124/84 - - (!) 101 (!) 25 (!) 83 %  06/24/16 0200 103/68 - - 81 14 (!) 89 %  06/24/16 0145 109/77 - - 75 17 97 %  06/24/16 0130 124/84 98.4 F (36.9 C) Oral 73 17 98 %  06/24/16 0115 109/77 - - 70 (!) 21 98 %  06/24/16 0112 119/74 - - 79 13 98 %  06/24/16 0110 (!) 0/0 - - - - -  06/24/16 0109 (!) 0/0 - - - - -  06/24/16 0108 (!) 0/0 - - - - -  06/24/16 0107 (!) 0/0 - - - - -  06/24/16 0106 (!) 0/0 - - - - -  06/24/16 0105 (!) 0/0 - - - - -  06/24/16 0104 (!) 0/0 - - - - -  06/24/16 0103 (!) 0/0 - - - - -  06/24/16 0102 (!) 0/0 - - - - -  06/24/16 0101 (!) 0/0 - - - - -  06/24/16 0100 (!) 0/0 - - 73 (!) 21 98 %  06/24/16 0059 (!) 0/0 - - - - -  06/24/16 0058 (!) 0/0 - - - - -  06/24/16 0057 (!) 0/0 - - - - -  06/24/16 0056 (!)  0/0 97.9 F (36.6 C) Oral - - -   General: NAD Abdomen: obese, soft, +BS (rare), c/d/i dressing Perineum: deferred Skin:  Warm and dry.  Cardiovascular: S1, S2 normal, no murmur, rub or gallop, regular rate and rhythm Respiratory:  Clear to auscultation bilateral. Normal respiratory effort Extremities: no c/c/e  Medications Current Facility-Administered Medications  Medication Dose Route Frequency Provider Last Rate Last Dose  . acetaminophen (TYLENOL) tablet 650 mg  650 mg Oral Q4H PRN [place] Bing, MD      . coconut oil  1 application Topical PRN [place] Bing, MD      . witch hazel-glycerin (TUCKS) pad 1 application  1 application Topical PRN [place] Bing, MD  And  . dibucaine (NUPERCAINAL) 1 % rectal ointment 1 application  1 application Rectal PRN [place] BingPickens, Sreya Froio, MD      . diphenhydrAMINE (BENADRYL) capsule 25 mg  25 mg Oral Q6H PRN [place] BingPickens, Suresh Audi, MD      . diphenhydrAMINE (BENADRYL) injection 12.5 mg  12.5 mg Intravenous Q4H PRN Lowella CurbMiller, Warren Ray, MD       Or  . diphenhydrAMINE (BENADRYL) capsule 25 mg  25 mg Oral Q4H PRN Lowella CurbMiller, Warren Ray, MD   25 mg at 06/24/16 0313  . [START ON 06/25/2016] ferrous sulfate tablet 325 mg  325 mg Oral BID WC [place] BingPickens, Rahiem Schellinger, MD      . ibuprofen (ADVIL,MOTRIN) tablet 600 mg  600 mg Oral Q6H Elmire Amrein, MD      . ketorolac (TORADOL) 30 MG/ML injection 30 mg  30 mg Intravenous Q6H PRN Lowella CurbMiller, Warren Ray, MD       Or  . ketorolac (TORADOL) 30 MG/ML injection 30 mg  30 mg Intramuscular Q6H PRN Lowella CurbMiller, Warren Ray, MD   30 mg at 06/24/16 0220  . lactated ringers infusion   Intravenous Continuous [place] BingPickens, Dorethea Strubel, MD 125 mL/hr at 06/24/16 0314    . menthol-cetylpyridinium (CEPACOL) lozenge 3 mg  1 lozenge Oral Q2H PRN [place] BingPickens, Aquiles Ruffini, MD      . nalbuphine (NUBAIN) injection 5 mg  5 mg Intravenous Q4H PRN Lowella CurbMiller, Warren Ray, MD       Or  . nalbuphine (NUBAIN) injection 5 mg  5 mg Subcutaneous Q4H PRN Lowella CurbMiller, Warren Ray, MD       . nalbuphine (NUBAIN) injection 5 mg  5 mg Intravenous Once PRN Lowella CurbMiller, Warren Ray, MD       Or  . nalbuphine (NUBAIN) injection 5 mg  5 mg Subcutaneous Once PRN Lowella CurbMiller, Warren Ray, MD      . naloxone [facility](NARCAN) 2 mg in dextrose 5 % 250 mL infusion  1-4 mcg/kg/hr Intravenous Continuous PRN Lowella CurbMiller, Warren Ray, MD      . naloxone [facility](NARCAN) injection 0.4 mg  0.4 mg Intravenous PRN Lowella CurbMiller, Warren Ray, MD       And  . sodium chloride flush (NS) 0.9 % injection 3 mL  3 mL Intravenous PRN Lowella CurbMiller, Warren Ray, MD      . ondansetron [facility](ZOFRAN) injection 4 mg  4 mg Intravenous Q8H PRN Lowella CurbMiller, Warren Ray, MD      . oxyCODONE (Oxy IR/ROXICODONE) immediate release tablet 10 mg  10 mg Oral Q4H PRN [place] BingPickens, Chessa Barrasso, MD   10 mg at 06/24/16 0543  . oxyCODONE (Oxy IR/ROXICODONE) immediate release tablet 5 mg  5 mg Oral Q4H PRN [place] BingPickens, Nichelle Renwick, MD      . oxytocin (PITOCIN) IV infusion 40 units in LR 1000 mL - Premix  2.5 Units/hr Intravenous Continuous [place] BingPickens, Aneesah Hernan, MD      . prenatal multivitamin tablet 1 tablet  1 tablet Oral Q1200 [place] BingPickens, Angelino Rumery, MD      . scopolamine (TRANSDERM-SCOP) 1 MG/3DAYS 1.5 mg  1 patch Transdermal Once Lowella CurbMiller, Warren Ray, MD   1.5 mg at 06/24/16 0315  . senna-docusate (Senokot-S) tablet 2 tablet  2 tablet Oral QHS PRN [place] BingPickens, Jorje Vanatta, MD      . simethicone (MYLICON) chewable tablet 80 mg  80 mg Oral TID Lacie DraftPC [place] Wadlow, Billey Goslingharlie, MD      . Melene Muller[START ON 06/25/2016] Tdap (BOOSTRIX) injection 0.5 mL  0.5 mL Intramuscular Once [place] BingPickens, Alysen Smylie, MD        Labs:   Recent Labs Lab  06/23/16 0740  WBC 8.3  HGB 10.1*  HCT 31.5*  PLT 217    Assessment & Plan:  Pt doing well *Postpartum/postop: routine care. Follow up foley out and void. Follow up AM cbc. D/w pt re: BC later.  * Rh neg: one infant is rh pos. rhig w/u ordered *Dispo: POD#2-4. Needs staples out POD#3-4 and steri strips applied. Request sent to [place] for 1wk incision check already.   Cornelia Copa MD Attending Center  for [facility])

## 2016-06-24 NOTE — Transfer of Care (Signed)
Immediate Anesthesia Transfer of Care Note  Patient: Jacqueline Clarke  Procedure(s) Performed: Procedure(s): CESAREAN SECTION (N/A) VAGINAL DELIVERY  Patient Location: PACU  Anesthesia Type:Epidural  Level of Consciousness: awake, alert , oriented and patient cooperative  Airway & Oxygen Therapy: Patient Spontanous Breathing  Post-op Assessment: Report given to RN and Post -op Vital signs reviewed and stable  Post vital signs: Reviewed and stable  Last Vitals:  Vitals:   06/23/16 2230 06/23/16 2250  BP: 125/90   Pulse:    Resp:    Temp:  36.9 C    Last Pain:  Vitals:   06/23/16 2250  TempSrc: Oral  PainSc:       Patients Stated Pain Goal: 2 (06/23/16 [id number])  Complications: No apparent anesthesia complications

## 2016-06-24 NOTE — Lactation Note (Signed)
This note was copied from a baby's chart. Lactation Consultation Note  Patient Name: Cherlynn KaiserBoyB Laiah Jane UJWJX'BToday's Date: 06/24/2016 Reason for consult: Initial assessment;NICU baby;Infant < 6lbs;Late preterm infant;Multiple gestation   Initial assessment with mom of 2918 hour old LPT twins in NICU. Mom reports her older children would not latch, she did not pump.   Mom has DEBP set up and is aware of how to use on Initiate setting. She reports she was told to clean pump parts post pumping. She has pumped once today. She says she knows how to hand express and is not getting colostrum yet.   Enc mom to pump every 2-3 hours for 15 minutes with initiate setting. Enc mom to hand express post pumping. Gave mom Providing Milk for Your Baby in NICU booklet, reviewed pumping, what to expect with pumping, colostrum, milk coming to volume, supply and demand, breast milk handling, storage and labeling. Mom has yellow # stickers and colostrum collection containers.   BF Resources Handout and LC Brochure given, mom informed of IP/OP Services, BF Support Groups and LC phone #. [facility]. She does not have a pump at home. [place] [facility] [place] [facility]. Mom was informed of WIC pump rentals.   Mom reports she has no questions/concerns at this time. She denied need for [facility] assistance at this time.    Maternal Data Formula Feeding for Exclusion: No Has patient been taught Hand Expression?: Yes Does the patient have breastfeeding experience prior to this delivery?: No (older children would not latch, did not pump)  Feeding    LATCH Score/Interventions                      Lactation Tools Discussed/Used WIC Program: Yes Pump Review: Setup, frequency, and cleaning;Milk Storage Initiated by:: reviewed and encouraged   Consult Status Consult Status: Follow-up Date: 06/25/16 Follow-up type: In-patient    Silas FloodSharon S Satoshi Kalas 06/24/2016, 5:59 PM

## 2016-06-24 NOTE — Anesthesia Postprocedure Evaluation (Signed)
Anesthesia Post Note  Patient: Jacqueline Clarke  Procedure(s) Performed: Procedure(s) (LRB): CESAREAN SECTION (N/A) VAGINAL DELIVERY     Patient location during evaluation: Mother Baby Anesthesia Type: Epidural Level of consciousness: awake and alert and oriented Pain management: pain level controlled Vital Signs Assessment: post-procedure vital signs reviewed and stable Respiratory status: spontaneous breathing and nonlabored ventilation Cardiovascular status: stable Postop Assessment: no headache, no backache, patient able to bend at knees, no signs of nausea or vomiting and adequate PO intake Anesthetic complications: no    Last Vitals:  Vitals:   06/24/16 0258 06/24/16 0400  BP: (!) 134/95 (!) 141/75  Pulse: 96 97  Resp: 16 20  Temp: 37.3 C 36.9 C    Last Pain:  Vitals:   06/24/16 0543  TempSrc:   PainSc: 8    Pain Goal: Patients Stated Pain Goal: 2 (06/23/16 [id number])               Madison HickmanGREGORY,Cairo Agostinelli

## 2016-06-24 NOTE — Addendum Note (Signed)
Addendum  created 06/24/16 0816 by Dayn Barich M, CRNA   Sign clinical note    

## 2016-06-24 NOTE — Op Note (Signed)
Operative Note   SURGERY DATE: 06/23/2016  PRE-OP DIAGNOSIS:  *Pregnancy @ 35/2 *Preterm labor *Status post vaginal delivery of fetus A *low lying placenta with fetus B *Questionable abruption of fetus B *Rh negative *BMI 47  POST-OP DIAGNOSIS: Same. delivered   PROCEDURE: urgent primary low transverse cesarean section via  Claretha CooperJoel Cohen method with double layer uterine closure  SURGEON: Surgeon(s) and Role:    * [place] BingPickens, Oluwateniola Leitch, MD - Primary  ASSISTANT:    Lorne Skeens* Schenk, Nicholas Michael, MD - Assisting  ANESTHESIA: epidural  ESTIMATED BLOOD LOSS: 1000mL  DRAINS: 100mL UOP via indwelling foley  TOTAL IV FLUIDS: 1700mL crystalloid  VTE PROPHYLAXIS: SCDs to bilateral lower extremities  ANTIBIOTICS: three grams of Cefazolin were given., within 1 hour of skin incision  SPECIMENS: placenta and fetus B cord gases  FINDINGS: No intra-abdominal adhesions were noted. Grossly normal uterus, tubes and ovaries. Clear amniotic fluid. Fetus B was cephalic female infant, weight 2380gm, APGARs 8/9, intact placenta.  Results for Cherlynn KaiserWATLINGTON, BOYB Yerania (MRN [id number]) as of 06/24/2016 00:46  Ref. Range 06/24/2016 00:10 06/24/2016 00:10  pH cord blood (arterial) Latest Ref Range: 7.210 - 7.380  7.235 7.307  pCO2 cord blood (arterial) Latest Ref Range: 42.0 - 56.0 mmHg 58.4 (H) 45.8  Bicarbonate Latest Ref Range: 13.0 - 22.0 mmol/L 23.8 (H) 22.2 (H)    PROCEDURE IN DETAIL: The patient was taken to the operating room where anesthesia was administered and normal fetal heart tones were confirmed. She was then prepped and draped in the normal fashion in the dorsal supine position with a leftward tilt.  After a time out was performed, a Claretha CooperJoel Cohen skin incision was made with the scalpel and carried through to the underlying layer of fascia, extended and the peritoneum bluntly entered and The rectus muscles were then separated in the midline.The bladder blade was inserted.  A low transverse hysterotomy was  made with the scalpel until the endometrial cavity was breached yielding clear amniotic fluid. This incision was extended bluntly and the infant's head, shoulders and body were delivered atraumatically.The cord was clamped x 2 and cut, and the infant was handed to the awaiting pediatricians, after delayed cord clamping was not done.  The placenta was then gradually expressed from the uterus and then the uterus was exteriorized and cleared of all clots and debris.  The fetal scalp electrode was removed from the uterus at this time.The hysterotomy was repaired with a running suture of 1-0 monocyrl. A second imbricating layer of 1-0 monocryl suture was then placed. Several figure-of-eight sutures of 1-0 monocyrl were added to achieve excellent hemostasis.   The uterus and adnexa were then returned to the abdomen, and the hysterotomy and all operative sites were reinspected and excellent hemostasis was noted after irrigation and suction of the abdomen with warm saline.  The peritoneum was closed with a running stitch of 3-0 Vicryl. The fascia was reapproximated with 0 Vicryl in a simple running fashion bilaterally. The subcutaneous layer was then reapproximated with interrupted sutures of 2-0 plain gut, and the skin was then closed with staples.  The patient  tolerated the procedure well. Sponge, lap, needle, and instrument counts were correct x 2. The patient was transferred to the recovery room awake, alert and breathing independently in stable condition.  Will give a dose of Unasyn post operatively, given amount vaginal to uterine cavity manipulation.   Cornelia Copaharlie Parrie Rasco, Jr. MD Attending Center for [facility](Faculty Practice)

## 2016-06-25 LAB — RH IG WORKUP (INCLUDES ABO/RH)
ABO/RH(D): O NEG
FETAL SCREEN: NEGATIVE
Gestational Age(Wks): 35
UNIT DIVISION: 0

## 2016-06-25 LAB — CULTURE, BETA STREP (GROUP B ONLY)

## 2016-06-25 MED ORDER — HYDROMORPHONE HCL 1 MG/ML IJ SOLN
0.5000 mg | Freq: Once | INTRAMUSCULAR | Status: AC
  Administered 2016-06-25: 0.5 mg via INTRAVENOUS
  Filled 2016-06-25: qty 1

## 2016-06-25 NOTE — Progress Notes (Signed)
Subjective: NSVD A twin: Postpartum Day #2 of twin B: Cesarean Delivery Patient reports incisional pain and no problems voiding.  Is not passing flatus, is not eating much. Ambulation and food encouraged.   Objective: Vital signs in last 24 hours: Temp:  [98 F (36.7 C)-98.5 F (36.9 C)] 98.4 F (36.9 C) (06/06 0630) Pulse Rate:  [67-104] 80 (06/06 0630) Resp:  [18-20] 20 (06/06 0630) BP: (97-134)/(61-83) 124/68 (06/06 0630) SpO2:  [95 %-100 %] 99 % (06/06 0630)  Physical Exam:  General: alert, cooperative and no distress Lochia: appropriate Uterine Fundus: firm Incision: no significant drainage, no dehiscence, no significant erythema DVT Evaluation: No evidence of DVT seen on physical exam. No cords or calf tenderness. No significant calf/ankle edema.   Recent Labs  06/23/16 0740 06/24/16 0612  HGB 10.1* 9.1*  HCT 31.5* 28.3*    Assessment/Plan: Status post Cesarean section. Doing well postoperatively.  Continue current care.  Anemia: on iron, asymptomatic. Blood pressure stable overnight. Encourage ambulation.   Ruhan Borak A Suri Tafolla 06/25/2016, 7:44 AM

## 2016-06-25 NOTE — Lactation Note (Signed)
This note was copied from a baby's chart. Lactation Consultation Note  Patient Name: Jacqueline Clarke   NICU twins 5638 hours old. Mom reports that she is not feeling well enough to pump at this time. Offered to assist mom, but she declined. Enc mom to ask for assistance as needed, and enc mom to pump as soon as she feels well enough. Discussed assessment and interventions with patient's bedside nurse, Luster Landsbergenee, RN.   Maternal Data    Feeding    LATCH Score/Interventions                      Lactation Tools Discussed/Used     Consult Status      Sherlyn HayJennifer D [name], 2:15 PM

## 2016-06-26 ENCOUNTER — Encounter (HOSPITAL_COMMUNITY): Payer: Self-pay | Admitting: *Deleted

## 2016-06-26 DIAGNOSIS — Z98891 History of uterine scar from previous surgery: Secondary | ICD-10-CM

## 2016-06-26 MED ORDER — DOCUSATE SODIUM 100 MG PO CAPS: 100.0000 mg | ORAL_CAPSULE | Freq: Two times a day (BID) | ORAL | 2 refills | Status: DC | PRN

## 2016-06-26 MED ORDER — HYDROMORPHONE HCL 2 MG PO TABS
2.0000 mg | ORAL_TABLET | ORAL | Status: DC | PRN
  Administered 2016-06-26: 2 mg via ORAL
  Filled 2016-06-26: qty 1

## 2016-06-26 MED ORDER — IBUPROFEN 600 MG PO TABS: 600.0000 mg | ORAL_TABLET | Freq: Four times a day (QID) | ORAL | 0 refills | Status: DC

## 2016-06-26 MED ORDER — HYDROMORPHONE HCL 2 MG PO TABS: 2.0000 mg | ORAL_TABLET | ORAL | 0 refills | Status: DC | PRN

## 2016-06-26 NOTE — Lactation Note (Signed)
This note was copied from a baby's chart. Lactation Consultation Note  Patient Name: Jacqueline Clarke NUUVO'ZToday's Date: 06/26/2016 Reason for consult: Follow-up assessment;NICU baby  NICU twins 3760 hours old. Mom reports that she would like for this LC to return later for assistance, reporting that she is not getting any EBM. Mom states that she is about to visit the babies in the NICU. [facility] as mom states that she will be discharged today. Mom's bedside nurse Kenney Housemananya, RN aware that [facility].   Maternal Data    Feeding Feeding Type: Donor Breast Milk Length of feed: 30 min  LATCH Score/Interventions                      Lactation Tools Discussed/Used     Consult Status Consult Status: Follow-up Date: 06/26/16 Follow-up type: In-patient    Jacqueline Clarke 06/26/2016, 12:20 PM

## 2016-06-26 NOTE — Discharge Summary (Signed)
OB Discharge Summary     Patient Name: Jacqueline Clarke DOB: 1985-06-10 MRN: [id number]  Date of admission: 06/23/2016 Delivering MD:    Genise, Strack [[id number]]  Arieana, Somoza Malkie [[id number]]  [place] Bing   Date of discharge: 06/26/2016  Admitting diagnosis: TWINS 35 WEEKS PRESSURE SWELLING POSSIBLE BACK LABOR Intrauterine pregnancy: [redacted]w[redacted]d     Secondary diagnosis:  Active Problems:   Indication for care in labor and delivery, antepartum   Blood type, Rh negative   Obesity   Status post primary low transverse cesarean section   Preterm delivery, delivered  Additional problems: SVD and LTCS.  Babies in NICU     Discharge diagnosis: Preterm Pregnancy Delivered and Gestational Hypertension                                                                                                Post partum procedures:n/a  Augmentation: n/a  Complications: None  Hospital course:  Onset of Labor With Vaginal Delivery     31 y.o. yo [id number] at [redacted]w[redacted]d was admitted in Active Labor on 06/23/2016. Baby A was delivered by SVD but Baby B delivered by C/S for abruption.  EBL 1000.    Chemika, [place] Murray [[id number]]  6:23 PM   Howard, Patton [[id number]]  6:23 PM ,   Timberlyn, Pickford [[id number]]  06/23/2016   Vendela, Troung Charlcie [[id number]]  06/23/2016   Intrapartum Procedures: Episiotomy:    Taraji, Mungo [[id number]]  None [1]   Aayushi, Solorzano Armanie [[id number]]  None [1]                                         Lacerations:     Irena, Gaydos [[id number]]  None [1]   Sumayya, Muha [[id number]]  None [1]  Patient had a delivery of a Viable infant x 2.   Ericha, Whittingham [[id number]]  06/23/2016   Madilyne, Tadlock [[id number]]  06/23/2016 Information for the patient's newborn:  Mekiyah, Gladwell [[id number]]  Delivery Method:  Vaginal, Spontaneous Delivery (Filed from Delivery Summary) Information for the patient's newborn:  Brieana, Shimmin [[id number]]       Pateint had an uncomplicated postpartum course.  She is ambulating, tolerating a regular diet, passing flatus, and urinating well. Patient is discharged home in stable condition on 06/26/16.   Physical exam  Vitals:   06/25/16 0800 06/25/16 1707 06/25/16 1900 06/26/16 0800  BP: 119/64 (!) 120/96 107/85 119/79  Pulse: 82 100  83  Resp: 20 18  18   Temp: 98.7 F (37.1 C) 98.8 F (37.1 C) 98.6 F (37 C) 98.6 F (37 C)  TempSrc: Oral Oral Oral Oral  SpO2: 100% 100% 100% 100%  Weight:      Height:       General: alert, cooperative and no distress Lochia: appropriate Uterine Fundus: firm Incision: Dressing partially peeling off skin, incision well approximated, no edema or erythema.  RN to replace dressing prior to D/C.  DVT Evaluation: No evidence of DVT seen on physical exam. Negative Homan's sign. No cords or calf tenderness. No significant calf/ankle edema. Labs: Lab Results  Component Value Date   WBC 10.0 06/24/2016   HGB 9.1 (L) 06/24/2016   HCT 28.3 (L) 06/24/2016   MCV 82.3 06/24/2016   PLT 180 06/24/2016   CMP Latest Ref Rng & Units 06/02/2016  Glucose 65 - 99 mg/dL 132(G103(H)  BUN 6 - 20 mg/dL 8  Creatinine 4.010.44 - 0.271.00 mg/dL 2.530.61  Sodium 664135 - 403145 mmol/L 135  Potassium 3.5 - 5.1 mmol/L 3.6  Chloride 101 - 111 mmol/L 108  CO2 22 - 32 mmol/L 20(L)  Calcium 8.9 - 10.3 mg/dL 4.7(Q8.8(L)  Total Protein 6.5 - 8.1 g/dL 6.5  Total Bilirubin 0.3 - 1.2 mg/dL 2.5(Z0.2(L)  Alkaline Phos 38 - 126 U/L 85  AST 15 - 41 U/L 15  ALT 14 - 54 U/L 6(L)    Discharge instruction: per After Visit Summary and "Baby and Me Booklet".  After visit meds:  Allergies as of 06/26/2016   No Known Allergies     Medication List    STOP taking these medications   aspirin EC 81 MG tablet   Doxylamine-Pyridoxine 10-10 MG Tbec Commonly known as:  DICLEGIS      TAKE these medications   acetaminophen 325 MG tablet Commonly known as:  TYLENOL Take 650 mg by mouth every 6 (six) hours as needed for moderate pain.   cyclobenzaprine 10 MG tablet Commonly known as:  FLEXERIL Take 1 tablet by mouth 3 (three) times daily as needed for muscle spasms.   docusate sodium 100 MG capsule Commonly known as:  COLACE Take 1 capsule (100 mg total) by mouth 2 (two) times daily as needed.   HYDROmorphone 2 MG tablet Commonly known as:  DILAUDID Take 1 tablet (2 mg total) by mouth every 4 (four) hours as needed for moderate pain or severe pain.   ibuprofen 600 MG tablet Commonly known as:  ADVIL,MOTRIN Take 1 tablet (600 mg total) by mouth every 6 (six) hours.   prenatal multivitamin Tabs tablet Take 1 tablet by mouth daily at 12 noon.   sertraline 50 MG tablet Commonly known as:  ZOLOFT Take 1 tablet (50 mg total) by mouth daily. Take 1/2 tablet qd x 7 days and then increase to 1 whole tablet qd What changed:  when to take this  additional instructions       Diet: routine diet  Activity: Advance as tolerated. Pelvic rest for 6 weeks.   Outpatient follow up:2 weeks Follow up Appt: Future Appointments Date Time Provider Department Center  07/04/2016 9:30 AM Brock BadHarper, Charles A, MD [facility] None   Follow up Visit:No Follow-up on file.  Postpartum contraception: Desires BTL, no papers, will have office sign papers with patient today for interval BTL.  Newborn Data:   Mearl LatinWatlington, BoyA Shantera [[id number]][[id number]]  Live born female  Birth Weight: 5 lb 10.7 oz (2570 g) APGAR: 5, 9   Cherlynn KaiserWatlington, BoyB Chayna [[id number]][[id number]]  Live born female  Birth Weight: 5 lb 4 oz (2380 g) APGAR: 8, 9  Baby Feeding: Breast Disposition:NICU   06/26/2016 Sharen CounterLisa Leftwich-Kirby, CNM

## 2016-06-26 NOTE — Clinical Social Work Maternal (Signed)
CLINICAL SOCIAL WORK MATERNAL/CHILD NOTE  Patient Details  Name: Jacqueline Clarke MRN: [id number] Date of Birth: Nov 23, 1985  Date:  06/26/2016  Clinical Social Worker Initiating Note:  Terri Piedra, [place] Date/ Time Initiated:  06/26/16/1200     Child's Name:  Jacqueline Clarke and Jacqueline Clarke   Legal Guardian:  Other (Comment) (Parents: Jacqueline Clarke and Jacqueline Clarke)   Need for Interpreter:  None   Date of Referral:   (No referral-NICU admission)     Reason for Referral:      Referral Source:      Address:  [address], [place], [place] [address]  Phone number:  [phone]   Household Members:      Natural Supports (not living in the home):      Professional Supports:     Employment:     Type of Work:     Education:      Museum/gallery curator Resources:  [employer]   Other Resources:  [employer], Food Stamps    Cultural/Religious Considerations Which May Impact Care: None stated.  Strengths:  Ability to meet basic needs    Risk Factors/Current Problems:  Mental Health Concerns  (MOB reports she feels depressed and has a hx of depression noted in her chart-currently taking Zoloft.)   Cognitive State:  Distractible    Mood/Affect:  Flat , Depressed  (MOB reports being in physical pain at this time.)   CSW Assessment: CSW met with parents and MOB's cousin in MOB's third floor room/307 to offer support, introduce services, and complete assessment due to babies' admissions to NICU at 35.2 weeks.   CSW found it difficult to engage with MOB due to various distractions from visitors, staff and MOB's physical discomfort.  Therefore, CSW's visit was fairly brief.  MOB reports babies are doing well and that she feels she has been kept up to date by NICU staff.  Parents report no issues with transportation in order to return to the hospital to be with the twins after MOB's discharge.  MOB states she has a good support system of family and friends living locally and, that her  mother is currently caring for her 4 older children (ages 26, 68, 39, and 1) while she is in the hospital.  MOB reports that they have some items for babies at home, but plan to put both babies to sleep in the same crib.  CSW provided education regarding SIDS and explained recommendation to put babies in separate beds.  CSW offered a bed from [employer] and MOB states she will use it.  MOB was appreciative.  CSW will make referral for bed and baby basics, as MOB stated she could benefit from additional baby supplies.   CSW briefly discussed PMADs and MOB's current emotional state.  MOB reports feeling "depressed."  She states she has been put on medication that "doesn't work."  CSW notes that she started Zoloft on 05/15/16 and is taking 71m at this point.  CSW encouraged her to speak with her doctor about the possibility of increasing her dose and informed her that it is still relatively early for the medication to be fully effective.  CSW asked MOB if she has ever had a counselor and she states she has not.  She is open to counseling and receptive of mental health resources provided by CSW.  CSW also provided MOB with contact information and asked her to call CSW if she would like to process her feelings at any time while babies  are in the NICU.  MOB stated appreciation.   CSW Plan/Description:  Information/Referral to [employer] , [employer] , Psychosocial Support and Ongoing Assessment of Needs    Alphonzo Cruise, [place] 06/26/2016, 1:29 PM

## 2016-06-27 ENCOUNTER — Inpatient Hospital Stay (HOSPITAL_COMMUNITY): Payer: Medicaid Other

## 2016-06-27 ENCOUNTER — Inpatient Hospital Stay (HOSPITAL_COMMUNITY)
Admission: AD | Admit: 2016-06-27 | Discharge: 2016-06-30 | DRG: 774 | Disposition: A | Discharge status: Home / Self Care | Payer: Medicaid Other | Source: Ambulatory Visit | Attending: Obstetrics and Gynecology | Admitting: Obstetrics and Gynecology

## 2016-06-27 ENCOUNTER — Encounter (HOSPITAL_COMMUNITY): Payer: Self-pay | Admitting: *Deleted

## 2016-06-27 DIAGNOSIS — K9189 Other postprocedural complications and disorders of digestive system: Secondary | ICD-10-CM | POA: Diagnosis present

## 2016-06-27 DIAGNOSIS — Z87891 Personal history of nicotine dependence: Secondary | ICD-10-CM

## 2016-06-27 DIAGNOSIS — O135 Gestational [pregnancy-induced] hypertension without significant proteinuria, complicating the puerperium: Secondary | ICD-10-CM | POA: Diagnosis present

## 2016-06-27 DIAGNOSIS — Z6841 Body Mass Index (BMI) 40.0 and over, adult: Secondary | ICD-10-CM | POA: Diagnosis not present

## 2016-06-27 DIAGNOSIS — O99215 Obesity complicating the puerperium: Secondary | ICD-10-CM | POA: Diagnosis present

## 2016-06-27 DIAGNOSIS — K567 Ileus, unspecified: Secondary | ICD-10-CM | POA: Diagnosis present

## 2016-06-27 DIAGNOSIS — Z8759 Personal history of other complications of pregnancy, childbirth and the puerperium: Secondary | ICD-10-CM | POA: Diagnosis present

## 2016-06-27 LAB — CBC WITH DIFFERENTIAL/PLATELET
Basophils Absolute: 0 10*3/uL (ref 0.0–0.1)
Basophils Relative: 0 %
EOS ABS: 0.1 10*3/uL (ref 0.0–0.7)
EOS PCT: 1 %
HCT: 27.6 % — ABNORMAL LOW (ref 36.0–46.0)
Hemoglobin: 8.8 g/dL — ABNORMAL LOW (ref 12.0–15.0)
LYMPHS ABS: 1.7 10*3/uL (ref 0.7–4.0)
Lymphocytes Relative: 16 %
MCH: 26.1 pg (ref 26.0–34.0)
MCHC: 31.9 g/dL (ref 30.0–36.0)
MCV: 81.9 fL (ref 78.0–100.0)
Monocytes Absolute: 0.5 10*3/uL (ref 0.1–1.0)
Monocytes Relative: 5 %
Neutro Abs: 8.6 10*3/uL — ABNORMAL HIGH (ref 1.7–7.7)
Neutrophils Relative %: 78 %
PLATELETS: 250 10*3/uL (ref 150–400)
RBC: 3.37 MIL/uL — AB (ref 3.87–5.11)
RDW: 17.5 % — ABNORMAL HIGH (ref 11.5–15.5)
WBC: 10.9 10*3/uL — AB (ref 4.0–10.5)

## 2016-06-27 LAB — COMPREHENSIVE METABOLIC PANEL
ALT: 11 U/L — AB (ref 14–54)
ANION GAP: 8 (ref 5–15)
AST: 23 U/L (ref 15–41)
Albumin: 2.2 g/dL — ABNORMAL LOW (ref 3.5–5.0)
Alkaline Phosphatase: 94 U/L (ref 38–126)
BUN: 8 mg/dL (ref 6–20)
CHLORIDE: 107 mmol/L (ref 101–111)
CO2: 24 mmol/L (ref 22–32)
Calcium: 8.3 mg/dL — ABNORMAL LOW (ref 8.9–10.3)
Creatinine, Ser: 0.69 mg/dL (ref 0.44–1.00)
GFR calc non Af Amer: >60 mL/min (ref >60)
Glucose, Bld: 83 mg/dL (ref 65–99)
Potassium: 3.8 mmol/L (ref 3.5–5.1)
SODIUM: 139 mmol/L (ref 135–145)
Total Bilirubin: 0.6 mg/dL (ref 0.3–1.2)
Total Protein: 5.4 g/dL — ABNORMAL LOW (ref 6.5–8.1)

## 2016-06-27 MED ORDER — IBUPROFEN 600 MG PO TABS: 600.0000 mg | ORAL_TABLET | Freq: Four times a day (QID) | ORAL | Status: DC

## 2016-06-27 MED ORDER — METOCLOPRAMIDE HCL 5 MG/ML IJ SOLN
10.0000 mg | Freq: Four times a day (QID) | INTRAMUSCULAR | Status: AC
  Administered 2016-06-28 – 2016-06-29 (×6): 10 mg via INTRAVENOUS
  Filled 2016-06-27 (×6): qty 2

## 2016-06-27 MED ORDER — OXYCODONE-ACETAMINOPHEN 5-325 MG PO TABS
1.0000 | ORAL_TABLET | ORAL | Status: DC | PRN
  Administered 2016-06-28 – 2016-06-30 (×2): 2 via ORAL
  Filled 2016-06-27: qty 2
  Filled 2016-06-27: qty 1
  Filled 2016-06-27: qty 2

## 2016-06-27 MED ORDER — ENOXAPARIN SODIUM 80 MG/0.8ML ~~LOC~~ SOLN
70.0000 mg | SUBCUTANEOUS | Status: DC
  Administered 2016-06-28 – 2016-06-29 (×3): 70 mg via SUBCUTANEOUS
  Filled 2016-06-27 (×4): qty 0.8

## 2016-06-27 MED ORDER — FLEET ENEMA 7-19 GM/118ML RE ENEM
1.0000 | ENEMA | Freq: Once | RECTAL | Status: AC
  Administered 2016-06-27: 1 via RECTAL

## 2016-06-27 MED ORDER — IBUPROFEN 600 MG PO TABS: 600.0000 mg | ORAL_TABLET | Freq: Four times a day (QID) | ORAL | Status: DC | PRN

## 2016-06-27 MED ORDER — PRENATAL MULTIVITAMIN CH
1.0000 | ORAL_TABLET | Freq: Every day | ORAL | Status: DC
  Administered 2016-06-30: 1 via ORAL
  Filled 2016-06-27: qty 1

## 2016-06-27 MED ORDER — SERTRALINE HCL 50 MG PO TABS
50.0000 mg | ORAL_TABLET | Freq: Every day | ORAL | Status: DC
  Administered 2016-06-30: 50 mg via ORAL
  Filled 2016-06-27 (×4): qty 1

## 2016-06-27 MED ORDER — SODIUM CHLORIDE 0.9 % IV SOLN
INTRAVENOUS | Status: DC
  Administered 2016-06-28: 02:00:00 via INTRAVENOUS

## 2016-06-27 MED ORDER — LACTATED RINGERS IV SOLN
INTRAVENOUS | Status: DC
  Administered 2016-06-27 – 2016-06-28 (×3): via INTRAVENOUS

## 2016-06-27 MED ORDER — DOCUSATE SODIUM 100 MG PO CAPS
100.0000 mg | ORAL_CAPSULE | Freq: Two times a day (BID) | ORAL | Status: DC
  Administered 2016-06-28: 100 mg via ORAL
  Filled 2016-06-27 (×3): qty 1

## 2016-06-27 MED ORDER — PROMETHAZINE HCL 25 MG/ML IJ SOLN
25.0000 mg | Freq: Once | INTRAMUSCULAR | Status: AC
  Administered 2016-06-27: 25 mg via INTRAVENOUS
  Filled 2016-06-27: qty 1

## 2016-06-27 MED ORDER — HYDROMORPHONE HCL 1 MG/ML IJ SOLN
1.0000 mg | INTRAMUSCULAR | Status: DC | PRN
  Administered 2016-06-27 – 2016-06-29 (×10): 1 mg via INTRAVENOUS
  Filled 2016-06-27 (×11): qty 1

## 2016-06-27 NOTE — H&P (Signed)
Jacqueline Clarke is an 31 y.o. female. She is admitted for a postoperative ileus 1 day after d/c after cesarean section for second twin, discharged on pod 2, with pt returning via EMS for abdominal pain , nausea and vomiting, with pt reporting no flatus since d/c and no bm since last Saturday, 6 days ago. Acute abdominal series nonspecific findings c/w ileus. No mention of free air.  Pertinent Gynecological History: Menses:  Bleeding: normal lochia Contraception: abstinence DES exposure:  Blood transfusions: none Sexually transmitted diseases: recent diagnosis: none  Previous GYN Procedures: cesarean section 06/23/16 after vaginal delivery of baby A  Last mammogram:  Date:  Last pap:  Date:  OB History: G7, P4124   Menstrual History: Menarche age:  No LMP recorded.    Past Medical History:  Diagnosis Date  . Anemia   . Anxiety   . Asthma   . Blood type, Rh negative   . Cholelithiasis   . Chronic back pain    scoliosis- on percocet  . Depression   . Headache(784.0)   . Hyperemesis complicating pregnancy, antepartum 2013  . 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 Obesity   . Pregnancy induced hypertension   . Scoliosis     Past Surgical History:  Procedure Laterality Date  . arm surgery    . CESAREAN SECTION N/A 06/23/2016   Procedure: CESAREAN SECTION;  Surgeon: [place] BingPickens, Charlie, MD;  [facility];  Service: Obstetrics;  Laterality: N/A;  . CHOLECYSTECTOMY    . DILATION AND CURETTAGE OF UTERUS  2008  . MR WRIST RIGHT     sx to nerves  . NERVE, TENDON AND ARTERY REPAIR Right 06/18/2014   Procedure: NERVE, TENDON AND ARTERY REPAIR;  Surgeon: Bradly BienenstockFred Ortmann, MD;  Location: [facility] OR;  Service: Orthopedics;  Laterality: Right;  . TONSILLECTOMY AND ADENOIDECTOMY    . VAGINAL DELIVERY  06/23/2016   Procedure: VAGINAL DELIVERY;  Surgeon: [place] BingPickens, Charlie, MD;  [facility];  Service: Obstetrics;;    Family History  Problem Relation Age  of Onset  . Heart disease Maternal Grandfather   . Diabetes Maternal Grandfather   . Heart disease Father   . Anesthesia problems Neg Hx     Social History:  reports that she quit smoking about 6 months ago. Her smoking use included Cigarettes. She smoked 0.50 packs per day. She has never used smokeless tobacco. She reports that she does not drink alcohol or use drugs.  Allergies: No Known Allergies  Prescriptions Prior to Admission  Medication Sig Dispense Refill Last Dose  . acetaminophen (TYLENOL) 325 MG tablet Take 650 mg by mouth every 6 (six) hours as needed for moderate pain.   06/22/2016 at Unknown time  . cyclobenzaprine (FLEXERIL) 10 MG tablet Take 1 tablet by mouth 3 (three) times daily as needed for muscle spasms.   3 Past Week at Unknown time  . docusate sodium (COLACE) 100 MG capsule Take 1 capsule (100 mg total) by mouth 2 (two) times daily as needed. 30 capsule 2   . HYDROmorphone (DILAUDID) 2 MG tablet Take 1 tablet (2 mg total) by mouth every 4 (four) hours as needed for moderate pain or severe pain. 30 tablet 0   . ibuprofen (ADVIL,MOTRIN) 600 MG tablet Take 1 tablet (600 mg total) by mouth every 6 (six) hours. 30 tablet 0   . Prenatal Vit-Fe Fumarate-FA (PRENATAL MULTIVITAMIN) TABS tablet Take 1 tablet by mouth daily at 12 noon. 30 tablet  6 06/22/2016 at Unknown time  . sertraline (ZOLOFT) 50 MG tablet Take 1 tablet (50 mg total) by mouth daily. Take 1/2 tablet qd x 7 days and then increase to 1 whole tablet qd (Patient taking differently: Take 50 mg by mouth at bedtime. ) 30 tablet 2 06/22/2016 at Unknown time    ROS  Blood pressure (!) 149/91, pulse 79, temperature 98.9 F (37.2 C), temperature source Oral, resp. rate 20, SpO2 97 %, not currently breastfeeding. Physical Exam  Constitutional: She is oriented to person, place, and time. She appears well-developed and well-nourished. She appears distressed.  Lethargic female, Af/Am, s/p tx with IV phenergan, sedated somewhat.  Oriented x 3,   HENT:  Head: Normocephalic and atraumatic.  Eyes: Pupils are equal, round, and reactive to light.  Neck: Neck supple.  Cardiovascular: Normal rate and regular rhythm.   Respiratory: Effort normal and breath sounds normal.  GI: Soft. She exhibits distension. There is tenderness. There is guarding.  Edematous Panniculus, with erythema in most dependent portion, NO erythema near incision. Dressing in place clean intact. . Absent Bowel sounds.  Musculoskeletal: Normal range of motion. She exhibits edema.  4+ edema  Neurological: She is oriented to person, place, and time.  Lethargic somnolent.  Skin: Skin is warm and dry.    Results for orders placed or performed during the hospital encounter of 06/27/16 (from the past 24 hour(s))  CBC with Differential     Status: Abnormal   Collection Time: 06/27/16  7:09 PM  Result Value Ref Range   WBC 10.9 (H) 4.0 - 10.5 K/uL   RBC 3.37 (L) 3.87 - 5.11 MIL/uL   Hemoglobin 8.8 (L) 12.0 - 15.0 g/dL   HCT 16.1 (L) 09.6 - 04.5 %   MCV 81.9 78.0 - 100.0 fL   MCH 26.1 26.0 - 34.0 pg   MCHC 31.9 30.0 - 36.0 g/dL   RDW 40.9 (H) 81.1 - 91.4 %   Platelets 250 150 - 400 K/uL   Neutrophils Relative % 78 %   Neutro Abs 8.6 (H) 1.7 - 7.7 K/uL   Lymphocytes Relative 16 %   Lymphs Abs 1.7 0.7 - 4.0 K/uL   Monocytes Relative 5 %   Monocytes Absolute 0.5 0.1 - 1.0 K/uL   Eosinophils Relative 1 %   Eosinophils Absolute 0.1 0.0 - 0.7 K/uL   Basophils Relative 0 %   Basophils Absolute 0.0 0.0 - 0.1 K/uL    Dg Abdomen Acute W/chest  Result Date: 06/27/2016 CLINICAL DATA:  Vomiting, bloating and constipation. Status post Cesarean section for twin pregnancy. Chest pain and swelling of legs. EXAM: DG ABDOMEN ACUTE W/ 1V CHEST COMPARISON:  None. FINDINGS: Enlarged appearing cardiac silhouette some of which could be due to the AP projection. No aortic aneurysm. No pneumonic consolidation, effusion or pneumothorax. Supine and decubitus views of the  abdomen are provided. Cholecystectomy clips are seen in the right upper quadrant. Superimposed gas containing small and large bowel loops are seen in the mid abdomen without definite source of obstruction. A mild ileus is not excluded. Skin staples project over the lower abdomen. IMPRESSION: 1. Nonspecific bowel gas pattern with superimposed gas containing small and large bowel loops in the mid abdomen possibly representing a localized postoperative ileus. 2. Enlarged cardiac silhouette may be due to the AP projection. No acute pulmonary disease. Electronically Signed   By: Tollie Eth M.D.   On: 06/27/2016 20:30    Assessment/Plan: Postop ileus Morbid obesity with panniculitis, felt 2  to lymphedema  Plan:  Iv fluids,  Sips and chips Fleets enema. Lasix 20 iv. q6. Dorothy Landgrebe V 06/27/2016, 8:52 PM

## 2016-06-27 NOTE — MAU Provider Note (Signed)
History     CSN: [id number]  Arrival date and time: 06/27/16 7829  First Provider Initiated Contact with Patient 06/27/16 1856      Chief Complaint  Patient presents with  . Abdominal Pain   HPI Jacqueline Clarke is a 31 y.o. [phone] female who presents 4 days s/p cesarean with abdominal pain. Had 34 wk twin delivery; baby A delivered vaginally & B delivered by c/section. Babies are in NICU. Reports generalized abdominal pain since her surgery. Pain has gradually worsened since then. Last BM was Saturday & hasn't passed gas since Sunday. Has been taking dilaudid & ibuprofen without relief. Endorses nausea & vomiting.   OB History    Gravida Para Term Preterm AB Living   7 5 4 1 2 4    SAB TAB Ectopic Multiple Live Births   2 0 0 1 4      Past Medical History:  Diagnosis Date  . Anemia   . Anxiety   . Asthma   . Blood type, Rh negative   . Cholelithiasis   . Chronic back pain    scoliosis- on percocet  . Depression   . Headache(784.0)   . Hyperemesis complicating pregnancy, antepartum 2013  . 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 Obesity   . Pregnancy induced hypertension   . Scoliosis     Past Surgical History:  Procedure Laterality Date  . arm surgery    . CESAREAN SECTION N/A 06/23/2016   Procedure: CESAREAN SECTION;  Surgeon: [place] Bing, MD;  [facility]: [facility]TH[facility];  Service: Obstetrics;  Laterality: N/A;  . CHOLECYSTECTOMY    . DILATION AND CURETTAGE OF UTERUS  2008  . MR WRIST RIGHT     sx to nerves  . NERVE, TENDON AND ARTERY REPAIR Right 06/18/2014   Procedure: NERVE, TENDON AND ARTERY REPAIR;  Surgeon: Bradly Bienenstock, MD;  Location: [facility] OR;  Service: Orthopedics;  Laterality: Right;  . TONSILLECTOMY AND ADENOIDECTOMY    . VAGINAL DELIVERY  06/23/2016   Procedure: VAGINAL DELIVERY;  Surgeon: [place] Bing, MD;  Location: [facility] BIRTHING SUIT[facility];  Service: Obstetrics;;    Family History  Problem Relation Age of Onset  . Heart disease  Maternal Grandfather   . Diabetes Maternal Grandfather   . Heart disease Father   . Anesthesia problems Neg Hx     Social History  Substance Use Topics  . Smoking status: Former Smoker    Packs/day: 0.50    Types: Cigarettes    Quit date: 12/05/2015  . Smokeless tobacco: Never Used  . Alcohol use No     Comment: Occassionally    Allergies: No Known Allergies  Prescriptions Prior to Admission  Medication Sig Dispense Refill Last Dose  . acetaminophen (TYLENOL) 325 MG tablet Take 650 mg by mouth every 6 (six) hours as needed for moderate pain.   06/22/2016 at Unknown time  . cyclobenzaprine (FLEXERIL) 10 MG tablet Take 1 tablet by mouth 3 (three) times daily as needed for muscle spasms.   3 Past Week at Unknown time  . docusate sodium (COLACE) 100 MG capsule Take 1 capsule (100 mg total) by mouth 2 (two) times daily as needed. 30 capsule 2   . HYDROmorphone (DILAUDID) 2 MG tablet Take 1 tablet (2 mg total) by mouth every 4 (four) hours as needed for moderate pain or severe pain. 30 tablet 0   . ibuprofen (ADVIL,MOTRIN) 600 MG tablet Take 1 tablet (600 mg total) by mouth every  6 (six) hours. 30 tablet 0   . Prenatal Vit-Fe Fumarate-FA (PRENATAL MULTIVITAMIN) TABS tablet Take 1 tablet by mouth daily at 12 noon. 30 tablet 6 06/22/2016 at Unknown time  . sertraline (ZOLOFT) 50 MG tablet Take 1 tablet (50 mg total) by mouth daily. Take 1/2 tablet qd x 7 days and then increase to 1 whole tablet qd (Patient taking differently: Take 50 mg by mouth at bedtime. ) 30 tablet 2 06/22/2016 at Unknown time    Review of Systems  Constitutional: Negative.   Gastrointestinal: Positive for abdominal distention, abdominal pain, constipation, nausea and vomiting. Negative for diarrhea.   Physical Exam   Blood pressure (!) 149/91, pulse 79, temperature 98.9 F (37.2 C), temperature source Oral, resp. rate 20, SpO2 97 %, not currently breastfeeding.  Physical Exam  Nursing note and vitals  reviewed. Constitutional: She is oriented to person, place, and time. She appears well-developed and well-nourished. She appears distressed.  HENT:  Head: Normocephalic and atraumatic.  Mouth/Throat: Mucous membranes are dry.  Lips dry & cracked  Eyes: Conjunctivae are normal. Right eye exhibits no discharge. Left eye exhibits no discharge. No scleral icterus.  Neck: Normal range of motion.  Respiratory: Effort normal. No respiratory distress.  GI: Soft. She exhibits distension. Bowel sounds are decreased. There is generalized tenderness. There is guarding. There is no rigidity.  Neurological: She is alert and oriented to person, place, and time.  Skin: Skin is warm and dry. She is not diaphoretic.  Psychiatric: She has a normal mood and affect. Her behavior is normal. Judgment and thought content normal.    MAU Course  Procedures Results for orders placed or performed during the hospital encounter of 06/27/16 (from the past 24 hour(s))  CBC with Differential     Status: Abnormal   Collection Time: 06/27/16  7:09 PM  Result Value Ref Range   WBC 10.9 (H) 4.0 - 10.5 K/uL   RBC 3.37 (L) 3.87 - 5.11 MIL/uL   Hemoglobin 8.8 (L) 12.0 - 15.0 g/dL   HCT 16.1 (L) 09.6 - 04.5 %   MCV 81.9 78.0 - 100.0 fL   MCH 26.1 26.0 - 34.0 pg   MCHC 31.9 30.0 - 36.0 g/dL   RDW 40.9 (H) 81.1 - 91.4 %   Platelets 250 150 - 400 K/uL   Neutrophils Relative % 78 %   Neutro Abs 8.6 (H) 1.7 - 7.7 K/uL   Lymphocytes Relative 16 %   Lymphs Abs 1.7 0.7 - 4.0 K/uL   Monocytes Relative 5 %   Monocytes Absolute 0.5 0.1 - 1.0 K/uL   Eosinophils Relative 1 %   Eosinophils Absolute 0.1 0.0 - 0.7 K/uL   Basophils Relative 0 %   Basophils Absolute 0.0 0.0 - 0.1 K/uL   Dg Abdomen Acute W/chest  Result Date: 06/27/2016 CLINICAL DATA:  Vomiting, bloating and constipation. Status post Cesarean section for twin pregnancy. Chest pain and swelling of legs. EXAM: DG ABDOMEN ACUTE W/ 1V CHEST COMPARISON:  None. FINDINGS:  Enlarged appearing cardiac silhouette some of which could be due to the AP projection. No aortic aneurysm. No pneumonic consolidation, effusion or pneumothorax. Supine and decubitus views of the abdomen are provided. Cholecystectomy clips are seen in the right upper quadrant. Superimposed gas containing small and large bowel loops are seen in the mid abdomen without definite source of obstruction. A mild ileus is not excluded. Skin staples project over the lower abdomen. IMPRESSION: 1. Nonspecific bowel gas pattern with superimposed gas containing small  and large bowel loops in the mid abdomen possibly representing a localized postoperative ileus. 2. Enlarged cardiac silhouette may be due to the AP projection. No acute pulmonary disease. Electronically Signed   By: Tollie Ethavid  Kwon M.D.   On: 06/27/2016 20:30     MDM CBC w/diff IV fluids Phenergan 25 mg Dr. Emelda FearFerguson called to evaluate patient  Assessment and Plan  A: 1. Postoperative ileus (HCC)    P; Admit  Judeth Hornrin Ceanna Wareing 06/27/2016, 6:56 PM

## 2016-06-27 NOTE — MAU Note (Signed)
Delivered on 6/4, first baby vag, 2nd was a c/s.  Has not had a BM or passed any gas since.  Pain is across abd, abd is tender to touch. Hasn't eaten since Wed. Has not drank much today. (2 cups of water and 1 cup of hot tea, lips are dry). States chest is starting to hurt. Babies are in NICU, doing well.  Pt unable to pick up breast pump.

## 2016-06-28 LAB — URINALYSIS, ROUTINE W REFLEX MICROSCOPIC
Bilirubin Urine: NEGATIVE
Glucose, UA: NEGATIVE mg/dL
Hgb urine dipstick: NEGATIVE
Ketones, ur: 20 mg/dL — AB
LEUKOCYTES UA: NEGATIVE
NITRITE: NEGATIVE
PH: 6 (ref 5.0–8.0)
Protein, ur: NEGATIVE mg/dL
SPECIFIC GRAVITY, URINE: 1.019 (ref 1.005–1.030)

## 2016-06-28 LAB — CBC WITH DIFFERENTIAL/PLATELET
BASOS ABS: 0 10*3/uL (ref 0.0–0.1)
BASOS PCT: 0 %
Eosinophils Absolute: 0.2 10*3/uL (ref 0.0–0.7)
Eosinophils Relative: 1 %
HEMATOCRIT: 28.4 % — AB (ref 36.0–46.0)
HEMOGLOBIN: 9.1 g/dL — AB (ref 12.0–15.0)
LYMPHS PCT: 20 %
Lymphs Abs: 2.3 10*3/uL (ref 0.7–4.0)
MCH: 25.9 pg — ABNORMAL LOW (ref 26.0–34.0)
MCHC: 32 g/dL (ref 30.0–36.0)
MCV: 80.7 fL (ref 78.0–100.0)
MONOS PCT: 5 %
Monocytes Absolute: 0.5 10*3/uL (ref 0.1–1.0)
NEUTROS ABS: 8.7 10*3/uL — AB (ref 1.7–7.7)
NEUTROS PCT: 74 %
Platelets: 251 10*3/uL (ref 150–400)
RBC: 3.52 MIL/uL — ABNORMAL LOW (ref 3.87–5.11)
RDW: 17.5 % — ABNORMAL HIGH (ref 11.5–15.5)
WBC: 11.7 10*3/uL — ABNORMAL HIGH (ref 4.0–10.5)

## 2016-06-28 LAB — COMPREHENSIVE METABOLIC PANEL
ALBUMIN: 2.1 g/dL — AB (ref 3.5–5.0)
ALT: 10 U/L — ABNORMAL LOW (ref 14–54)
ANION GAP: 9 (ref 5–15)
AST: 17 U/L (ref 15–41)
Alkaline Phosphatase: 80 U/L (ref 38–126)
BUN: 9 mg/dL (ref 6–20)
CO2: 25 mmol/L (ref 22–32)
Calcium: 7.7 mg/dL — ABNORMAL LOW (ref 8.9–10.3)
Chloride: 106 mmol/L (ref 101–111)
Creatinine, Ser: 0.78 mg/dL (ref 0.44–1.00)
GFR calc Af Amer: >60 mL/min (ref >60)
GFR calc non Af Amer: >60 mL/min (ref >60)
GLUCOSE: 81 mg/dL (ref 65–99)
POTASSIUM: 3.3 mmol/L — AB (ref 3.5–5.1)
SODIUM: 140 mmol/L (ref 135–145)
TOTAL PROTEIN: 5.5 g/dL — AB (ref 6.5–8.1)
Total Bilirubin: 0.5 mg/dL (ref 0.3–1.2)

## 2016-06-28 MED ORDER — KETOROLAC TROMETHAMINE 30 MG/ML IJ SOLN
30.0000 mg | Freq: Four times a day (QID) | INTRAMUSCULAR | Status: AC
  Administered 2016-06-28 – 2016-06-29 (×4): 30 mg via INTRAVENOUS
  Filled 2016-06-28 (×5): qty 1

## 2016-06-28 MED ORDER — POTASSIUM CHLORIDE IN NACL 40-0.9 MEQ/L-% IV SOLN
Freq: Once | INTRAVENOUS | Status: AC
  Administered 2016-06-28: 185 mL/h via INTRAVENOUS
  Filled 2016-06-28 (×2): qty 1000

## 2016-06-28 MED ORDER — ORAL CARE MOUTH RINSE: 15.0000 mL | Freq: Two times a day (BID) | OROMUCOSAL | Status: DC

## 2016-06-28 MED ORDER — PANTOPRAZOLE SODIUM 40 MG IV SOLR
40.0000 mg | INTRAVENOUS | Status: DC
  Administered 2016-06-28 – 2016-06-29 (×2): 40 mg via INTRAVENOUS
  Filled 2016-06-28 (×4): qty 40

## 2016-06-28 MED ORDER — FUROSEMIDE 10 MG/ML IJ SOLN
20.0000 mg | Freq: Four times a day (QID) | INTRAMUSCULAR | Status: DC
  Administered 2016-06-28 (×2): 20 mg via INTRAVENOUS
  Filled 2016-06-28 (×2): qty 2

## 2016-06-28 MED ORDER — MAGNESIUM CITRATE PO SOLN: 300.0000 mL | Freq: Once | ORAL | Status: DC

## 2016-06-28 MED ORDER — OXYCODONE-ACETAMINOPHEN 5-325 MG PO TABS
1.0000 | ORAL_TABLET | Freq: Four times a day (QID) | ORAL | Status: DC | PRN
  Filled 2016-06-28: qty 1

## 2016-06-28 MED ORDER — CHLORHEXIDINE GLUCONATE 0.12 % MT SOLN
15.0000 mL | Freq: Two times a day (BID) | OROMUCOSAL | Status: DC
  Administered 2016-06-29 – 2016-06-30 (×3): 15 mL via OROMUCOSAL
  Filled 2016-06-28 (×6): qty 15

## 2016-06-28 MED ORDER — IBUPROFEN 600 MG PO TABS
600.0000 mg | ORAL_TABLET | Freq: Four times a day (QID) | ORAL | Status: DC
  Administered 2016-06-30: 600 mg via ORAL
  Filled 2016-06-28: qty 1

## 2016-06-28 MED ORDER — POTASSIUM CHLORIDE IN NACL 20-0.9 MEQ/L-% IV SOLN
INTRAVENOUS | Status: DC
  Administered 2016-06-29 (×3): via INTRAVENOUS
  Filled 2016-06-28 (×8): qty 1000

## 2016-06-28 MED ORDER — SODIUM CHLORIDE 0.9 % IV BOLUS (SEPSIS)
250.0000 mL | Freq: Three times a day (TID) | INTRAVENOUS | Status: DC
  Administered 2016-06-28: 500 mL via INTRAVENOUS
  Administered 2016-06-28 – 2016-06-29 (×2): 200 mL via INTRAVENOUS

## 2016-06-28 NOTE — Progress Notes (Signed)
Discussed with patient the importance of walking in hallway.  Patient stated " I can't walk right now.  I want to see my MD."

## 2016-06-28 NOTE — Progress Notes (Signed)
Subjective: Patient reports incisional pain.  Radiating to left side and to posterior superior iliac crest.  Objective: I have reviewed patient's vital signs, intake and output and medications. BP (!) 142/89 (BP Location: Right Arm) Comment: RN notified  Pulse 82   Temp 98.9 F (37.2 C) (Oral)   Resp 20   Ht 5\' 11"  (1.803 m)   Wt (!) 144.1 kg (317 lb 12 oz)   SpO2 99%   Breastfeeding? No   BMI 44.32 kg/m   General: alert, cooperative, fatigued, morbidly obese, uncooperative and low motivation for doing her need ed movement Resp: clear to auscultation bilaterally GI: incision: clean, dry and intact and bowel sounds noted occasionally Extremities: extremities normal, atraumatic, no cyanosis or edema, edema marked, but better, erythema of pannus is gone. and Homans sign is negative, no sign of DVT Vaginal Bleeding: minimal   Assessment/Plan: Postop ileus, s/p cesarean now pod 5.  LOS: 1 day  Add toradol, switch to po dilaudid D/c foley this pm 2 pm Knee length support stockings  Out of bed several times. Today.  Bekki Tavenner V 06/28/2016, 9:39 AM

## 2016-06-28 NOTE — Progress Notes (Signed)
6414fr foley placed per MD order, pt tolerated procedure well Christain SacramentoJinean, RN present as second person assisting during insertion. Peri care care provided prior to insertion and sterile procedure maintained throughout. Dark amber urine returned. Fleets enema then given with minimal results (smear). Pt with continued complaints of abdominal pain. Pt sleep when this RN enters the room, state the pain wakes her up but she does get some relief with PRN meds. Educated on need to reduce pain med usage when possible, voiced understanding. Will continue to monitor.

## 2016-06-29 LAB — BASIC METABOLIC PANEL
Anion gap: 7 (ref 5–15)
BUN: 10 mg/dL (ref 6–20)
CO2: 22 mmol/L (ref 22–32)
CREATININE: 0.71 mg/dL (ref 0.44–1.00)
Calcium: 7.6 mg/dL — ABNORMAL LOW (ref 8.9–10.3)
Chloride: 111 mmol/L (ref 101–111)
GFR calc Af Amer: >60 mL/min (ref >60)
GFR calc non Af Amer: >60 mL/min (ref >60)
Glucose, Bld: 72 mg/dL (ref 65–99)
POTASSIUM: 3.9 mmol/L (ref 3.5–5.1)
SODIUM: 140 mmol/L (ref 135–145)

## 2016-06-29 MED ORDER — BISACODYL 10 MG RE SUPP
10.0000 mg | Freq: Once | RECTAL | Status: DC
  Filled 2016-06-29: qty 1

## 2016-06-29 MED ORDER — CLOTRIMAZOLE 1 % VA CREA
1.0000 | TOPICAL_CREAM | Freq: Every day | VAGINAL | Status: DC
  Administered 2016-06-30: 1 via VAGINAL
  Filled 2016-06-29: qty 45

## 2016-06-29 NOTE — Progress Notes (Signed)
Patient to NICU to visits babies via wheelchair and family.

## 2016-06-29 NOTE — Progress Notes (Signed)
Dr. Penne LashLeggett notified of patients status with NG drainage.  See this RN's previous note.  Orders received.

## 2016-06-29 NOTE — Progress Notes (Signed)
Readmission for postop ileus, Pt has improved dramatically since placing NG, now feels well , had bm and passed flatus, now feels like she could use bathroom for bm again will tx dulcolax  Subjective: Patient reports + flatus and + BM.    Objective: I have reviewed patient's vital signs and intake and output. CBC Latest Ref Rng & Units 06/28/2016 06/27/2016 06/24/2016  WBC 4.0 - 10.5 K/uL 11.7(H) 10.9(H) 10.0  Hemoglobin 12.0 - 15.0 g/dL 1.1(B9.1(L) 1.4(N8.8(L) 8.2(N9.1(L)  Hematocrit 36.0 - 46.0 % 28.4(L) 27.6(L) 28.3(L)  Platelets 150 - 400 K/uL 251 250 180   BMP Latest Ref Rng & Units 06/28/2016 06/27/2016 06/02/2016  Glucose 65 - 99 mg/dL 81 83 562(Z103(H)  BUN 6 - 20 mg/dL 9 8 8   Creatinine 0.44 - 1.00 mg/dL 3.080.78 6.570.69 8.460.61  BUN/Creat Ratio 8 - 20 - - -  Sodium 135 - 145 mmol/L 140 139 135  Potassium 3.5 - 5.1 mmol/L 3.3(L) 3.8 3.6  Chloride 101 - 111 mmol/L 106 107 108  CO2 22 - 32 mmol/L 25 24 20(L)  Calcium 8.9 - 10.3 mg/dL 7.7(L) 8.3(L) 8.8(L)    Intake/Output Summary (Last 24 hours) at 06/29/16 0741 Last data filed at 06/29/16 [id number]  Gross per 24 hour  Intake             1000 ml  Output             2875 ml  Net            -1875 ml  NG output has been 200 cc/ 4hours. Clearing    General: alert, cooperative, appears stated age and morbidly obese Resp: clear to auscultation bilaterally GI: soft, non-tender; bowel sounds normal; no masses,  no organomegaly and incision: clean, dry and intact Extremities: Homans sign is negative, no sign of DVT  GI erythema of pannus is improved.  Assessment/Plan: Resolved Ileus.  Plan  D/c foley, ambulate, dulcolax suppository this am Clamp NG this am, give clear liquids, and test NG contents at 4 pm Probable d/c in am  LOS: 2 days    Baileigh Modisette V 06/29/2016, 7:36 AM

## 2016-06-29 NOTE — Progress Notes (Signed)
Patient has medium sized BM that was greenish, brown in color.  Stool was soft unformed.

## 2016-06-29 NOTE — Progress Notes (Signed)
NG tube reattached to wall suction @ 1600.  No drainage noted in canister @ this time.  Patient denies nausea.  Has been able to tolerate clear liquids.  Continues to pass gas and have BM's.

## 2016-06-29 NOTE — Lactation Note (Signed)
This note was copied from a baby's chart. Lactation Consultation Note  Patient Name: Jacqueline LatinBoyA Nyliah Harth RUEAV'WToday's Date: 06/29/2016 Reason for consult: Follow-up assessment;NICU baby;Multiple gestation   Follow up with mom of NICU twins. Mom readmitted for Ileus. Mom reports she began pumping again this morning since her milk is now in. Breasts are filling but not engorged. Enc mom to pump every 2-3 hours for 15-20  minutes on Maintenance setting, Enc mom to pump until breasts are softened. Enc mom to take all EBM to NICU for infants. Mom asked me to see if she is pumping correctly. She is using # 27 flanges and is pumping once at a time due to size of breasts. Enc mom to lean forward a little bit and to use hands on pumping.  Mom has bottles and labels for EBM. Mom without further questions/concerns at this time.      Maternal Data Formula Feeding for Exclusion: Yes Has patient been taught Hand Expression?: Yes Does the patient have breastfeeding experience prior to this delivery?: No  Feeding Feeding Type: Donor Breast Milk Nipple Type: Slow - flow Length of feed: 40 min  LATCH Score/Interventions                      Lactation Tools Discussed/Used WIC Program: Yes Pump Review: Setup, frequency, and cleaning;Milk Storage Initiated by:: Reviewed and encouraged   Consult Status Consult Status: Follow-up Date: 06/30/16 Follow-up type: In-patient    Jacqueline Clarke 06/29/2016, 10:18 AM

## 2016-06-30 DIAGNOSIS — Z8759 Personal history of other complications of pregnancy, childbirth and the puerperium: Secondary | ICD-10-CM | POA: Diagnosis present

## 2016-06-30 LAB — COMPREHENSIVE METABOLIC PANEL
ALBUMIN: 2.3 g/dL — AB (ref 3.5–5.0)
ALT: 12 U/L — AB (ref 14–54)
AST: 15 U/L (ref 15–41)
Alkaline Phosphatase: 74 U/L (ref 38–126)
Anion gap: 7 (ref 5–15)
BILIRUBIN TOTAL: 0.6 mg/dL (ref 0.3–1.2)
BUN: 10 mg/dL (ref 6–20)
CO2: 22 mmol/L (ref 22–32)
CREATININE: 0.7 mg/dL (ref 0.44–1.00)
Calcium: 8.3 mg/dL — ABNORMAL LOW (ref 8.9–10.3)
Chloride: 110 mmol/L (ref 101–111)
GFR calc Af Amer: >60 mL/min (ref >60)
GLUCOSE: 80 mg/dL (ref 65–99)
POTASSIUM: 3.7 mmol/L (ref 3.5–5.1)
Sodium: 139 mmol/L (ref 135–145)
TOTAL PROTEIN: 5.9 g/dL — AB (ref 6.5–8.1)

## 2016-06-30 LAB — CBC WITH DIFFERENTIAL/PLATELET
BASOS ABS: 0 10*3/uL (ref 0.0–0.1)
Basophils Relative: 0 %
EOS ABS: 0.2 10*3/uL (ref 0.0–0.7)
EOS PCT: 2 %
HCT: 24.9 % — ABNORMAL LOW (ref 36.0–46.0)
Hemoglobin: 8.1 g/dL — ABNORMAL LOW (ref 12.0–15.0)
LYMPHS ABS: 2 10*3/uL (ref 0.7–4.0)
LYMPHS PCT: 21 %
MCH: 26.6 pg (ref 26.0–34.0)
MCHC: 32.5 g/dL (ref 30.0–36.0)
MCV: 81.6 fL (ref 78.0–100.0)
MONO ABS: 0.5 10*3/uL (ref 0.1–1.0)
Monocytes Relative: 5 %
Neutro Abs: 6.8 10*3/uL (ref 1.7–7.7)
Neutrophils Relative %: 72 %
PLATELETS: 249 10*3/uL (ref 150–400)
RBC: 3.05 MIL/uL — ABNORMAL LOW (ref 3.87–5.11)
RDW: 17.8 % — AB (ref 11.5–15.5)
WBC: 9.4 10*3/uL (ref 4.0–10.5)

## 2016-06-30 LAB — C DIFFICILE QUICK SCREEN W PCR REFLEX
C DIFFICILE (CDIFF) TOXIN: NEGATIVE
C Diff antigen: NEGATIVE
C Diff interpretation: NOT DETECTED

## 2016-06-30 MED ORDER — CLOTRIMAZOLE 1 % VA CREA: 1.0000 | TOPICAL_CREAM | Freq: Every day | VAGINAL | 0 refills | Status: DC

## 2016-06-30 MED ORDER — DOCUSATE SODIUM 100 MG PO CAPS: 100.0000 mg | ORAL_CAPSULE | Freq: Two times a day (BID) | ORAL | 0 refills | Status: DC

## 2016-06-30 MED ORDER — FERROUS GLUCONATE 324 (38 FE) MG PO TABS: 324.0000 mg | ORAL_TABLET | Freq: Two times a day (BID) | ORAL | 0 refills | Status: DC

## 2016-06-30 MED ORDER — POLYETHYLENE GLYCOL 3350 17 G PO PACK: 17.0000 g | PACK | Freq: Every day | ORAL | 3 refills | Status: DC

## 2016-06-30 MED ORDER — IBUPROFEN 600 MG PO TABS: 600.0000 mg | ORAL_TABLET | Freq: Four times a day (QID) | ORAL | 0 refills | Status: DC | PRN

## 2016-06-30 NOTE — Progress Notes (Signed)
Subjective: Patient reports tolerating PO, + flatus and + BM.  She is having vulvar irritation, and it appears to have satellite lesions most c/w monilia. She has had several loose stools over night, and + malodor, so testing for C Diff.is being done.   Objective: I have reviewed patient's vital signs, intake and output, medications and labs.  Physical Examination: General appearance - alert, well appearing, and in no distress, oriented to person, place, and time and overweight Mental status - alert, oriented to person, place, and time, normal mood, behavior, speech, dress, motor activity, and thought processes Abdomen - soft, nontender, nondistended, no masses or organomegaly Incision clean, some moisture under pannus, pt to apply light dressing, staples in place. Pelvic - VULVA: vulvar tenderness from swelling, vulvar edema 3+, vulvar excoriation irritation, suspect yeast, exam limited by obesity Extremities - pedal edema moderate, improved +, Homan's sign negative bilaterally Skin - LESIONS NOTED: suspected sattelite lesions of yeast on the  Mons, and intertrigo     Assessment/Plan: followup C Diff. D.c home this am  LOS: 3 days    Aleea Hendry V 06/30/2016, 9:16 AM

## 2016-06-30 NOTE — Progress Notes (Signed)
Pt  Ambulated out teaching complete  

## 2016-06-30 NOTE — Progress Notes (Signed)
Subjective: Patient reports tolerating PO, + flatus, + BM and no problems voiding.  Had 3 bm's in last 16 hours, last one loose , so C Diff testing to be done, awaiting sample collection Vulvar irritation Objective: I have reviewed patient's vital signs, intake and output and labs.  General: alert, cooperative and no distress Resp: clear to auscultation bilaterally GI: soft, non-tender; bowel sounds normal; no masses,  no organomegaly and incision: clean, dry, intact and dressing d/c Extremities: Homans sign is negative, no sign of DVT vulva irritated.R x Nystatin + mMonistat   Assessment/Plan: Resolved postop ileus s/p cesarean and vag del of twins that are in NICU R/o c diff D/c home today   LOS: 3 days    Jacqueline Clarke V 06/30/2016, 7:24 AM

## 2016-06-30 NOTE — Discharge Summary (Addendum)
Physician Discharge Summary  Patient ID: Jacqueline Clarke MRN: [id number] DOB/AGE: 04-07-1985 31[name] y.o.Jacqueline Reaper10 y.o.  Admit date: 06/27/2016 Discharge date: 06/30/2016  Admission Diagnoses: Post operative illeus BMI 40s, pre-existing gestational HTN, h/o c-section  Discharge Diagnoses:  Active Problems:   Postoperative ileus (HCC)   Gestational hypertension   Discharged Condition: good  Hospital Course: Patient was admitted with post operative ileus, she was made NPO and give enema with out much success. An NG tube was placed for decompression which helped patients pain. She had continue bowel regimen with improvement with BMs and flatus. NGT removed on 6/10 and pt continued to have improvement. On day of discharge she was meeting all post op goals and had no s/s of anemia. C diff was negative on this day, too  Consults: None  Significant Diagnostic Studies:  Abdominal x-ray - post operative ileus  CBC Latest Ref Rng & Units 06/30/2016 06/28/2016 06/27/2016  WBC 4.0 - 10.5 K/uL 9.4 11.7(H) 10.9(H)  Hemoglobin 12.0 - 15.0 g/dL 8.1(L) 9.1(L) 8.8(L)  Hematocrit 36.0 - 46.0 % 24.9(L) 28.4(L) 27.6(L)  Platelets 150 - 400 K/uL 249 251 250   CMP Latest Ref Rng & Units 06/30/2016 06/29/2016 06/28/2016  Glucose 65 - 99 mg/dL 80 72 81  BUN 6 - 20 mg/dL 10 10 9   Creatinine 0.44 - 1.00 mg/dL 4.090.70 8.110.71 9.140.78  Sodium 135 - 145 mmol/L 139 140 140  Potassium 3.5 - 5.1 mmol/L 3.7 3.9 3.3(L)  Chloride 101 - 111 mmol/L 110 111 106  CO2 22 - 32 mmol/L 22 22 25   Calcium 8.9 - 10.3 mg/dL 8.3(L) 7.6(L) 7.7(L)  Total Protein 6.5 - 8.1 g/dL 5.9(L) - 5.5(L)  Total Bilirubin 0.3 - 1.2 mg/dL 0.6 - 0.5  Alkaline Phos 38 - 126 U/L 74 - 80  AST 15 - 41 U/L 15 - 17  ALT 14 - 54 U/L 12(L) - 10(L)   Discharge Exam: Blood pressure (!) 145/83, pulse 78, temperature 98.9 F (37.2 C), temperature source Oral, resp. rate 18, height 5\' 11"  (1.803 m), weight (!) 144.7 kg (319 lb), SpO2 99 %, not currently breastfeeding.  See  note from Dr. Emelda FearFerguson from same day.  Disposition: 01-Home or Self Care  Discharge Instructions    Activity as tolerated    Complete by:  As directed    Call MD for:  difficulty breathing, headache or visual disturbances    Complete by:  As directed    Call MD for:  extreme fatigue    Complete by:  As directed    Call MD for:  persistant dizziness or light-headedness    Complete by:  As directed    Call MD for:  persistant nausea and vomiting    Complete by:  As directed    Call MD for:  severe uncontrolled pain    Complete by:  As directed    Call MD for:  temperature >100.4    Complete by:  As directed    Diet - low sodium heart healthy    Complete by:  As directed    Sexual acrtivity    Complete by:  As directed    No sexual activity and nothing in the vaginal for 6wks.     Allergies as of 06/30/2016   No Known Allergies     Medication List    STOP taking these medications   ASPIRIN LOW DOSE 81 MG EC tablet Generic drug:  aspirin   DICLEGIS 10-10 MG Tbec Generic drug:  Doxylamine-Pyridoxine  TAKE these medications   acetaminophen 325 MG tablet Commonly known as:  TYLENOL Take 650 mg by mouth every 6 (six) hours as needed for moderate pain.   clotrimazole 1 % vaginal cream Commonly known as:  GYNE-LOTRIMIN Place 1 Applicatorful vaginally at bedtime.   cyclobenzaprine 10 MG tablet Commonly known as:  FLEXERIL Take 1 tablet by mouth 3 (three) times daily as needed for muscle spasms.   docusate sodium 100 MG capsule Commonly known as:  COLACE Take 1 capsule (100 mg total) by mouth 2 (two) times daily as needed. What changed:  Another medication with the same name was added. Make sure you understand how and when to take each.   docusate sodium 100 MG capsule Commonly known as:  COLACE Take 1 capsule (100 mg total) by mouth 2 (two) times daily. What changed:  You were already taking a medication with the same name, and this prescription was added. Make  sure you understand how and when to take each.   ferrous gluconate 324 MG tablet Commonly known as:  FERGON Take 1 tablet (324 mg total) by mouth 2 (two) times daily with a meal.   HYDROmorphone 2 MG tablet Commonly known as:  DILAUDID Take 1 tablet (2 mg total) by mouth every 4 (four) hours as needed for moderate pain or severe pain.   ibuprofen 600 MG tablet Commonly known as:  ADVIL,MOTRIN Take 1 tablet (600 mg total) by mouth every 6 (six) hours as needed. What changed:  when to take this  reasons to take this   polyethylene glycol packet Commonly known as:  MIRALAX / GLYCOLAX Take 17 g by mouth daily.   prenatal multivitamin Tabs tablet Take 1 tablet by mouth daily at 12 noon.   sertraline 50 MG tablet Commonly known as:  ZOLOFT Take 1 tablet (50 mg total) by mouth daily. Take 1/2 tablet qd x 7 days and then increase to 1 whole tablet qd What changed:  when to take this  additional instructions      She was told to keep her 6/15 Dr. Clearance Coots visit.   SignedRandolph Bing 06/30/2016, 2:13 PM

## 2016-07-03 ENCOUNTER — Emergency Department (HOSPITAL_COMMUNITY): Payer: Medicaid Other

## 2016-07-03 ENCOUNTER — Inpatient Hospital Stay (HOSPITAL_COMMUNITY)
Admission: EM | Admit: 2016-07-03 | Discharge: 2016-07-06 | DRG: 776 | Disposition: A | Discharge status: Home / Self Care | Payer: Medicaid Other | Attending: Obstetrics & Gynecology | Admitting: Obstetrics & Gynecology

## 2016-07-03 ENCOUNTER — Encounter (HOSPITAL_COMMUNITY): Payer: Self-pay | Admitting: Emergency Medicine

## 2016-07-03 DIAGNOSIS — N12 Tubulo-interstitial nephritis, not specified as acute or chronic: Secondary | ICD-10-CM

## 2016-07-03 DIAGNOSIS — R509 Fever, unspecified: Secondary | ICD-10-CM

## 2016-07-03 DIAGNOSIS — R109 Unspecified abdominal pain: Secondary | ICD-10-CM

## 2016-07-03 DIAGNOSIS — O8621 Infection of kidney following delivery: Secondary | ICD-10-CM | POA: Diagnosis present

## 2016-07-03 DIAGNOSIS — D729 Disorder of white blood cells, unspecified: Secondary | ICD-10-CM

## 2016-07-03 DIAGNOSIS — O9081 Anemia of the puerperium: Secondary | ICD-10-CM | POA: Diagnosis present

## 2016-07-03 DIAGNOSIS — Z8719 Personal history of other diseases of the digestive system: Secondary | ICD-10-CM

## 2016-07-03 DIAGNOSIS — Z87891 Personal history of nicotine dependence: Secondary | ICD-10-CM

## 2016-07-03 DIAGNOSIS — E8809 Other disorders of plasma-protein metabolism, not elsewhere classified: Secondary | ICD-10-CM

## 2016-07-03 DIAGNOSIS — D649 Anemia, unspecified: Secondary | ICD-10-CM

## 2016-07-03 DIAGNOSIS — T8140XA Infection following a procedure, unspecified, initial encounter: Secondary | ICD-10-CM

## 2016-07-03 DIAGNOSIS — M549 Dorsalgia, unspecified: Secondary | ICD-10-CM | POA: Diagnosis present

## 2016-07-03 DIAGNOSIS — A419 Sepsis, unspecified organism: Secondary | ICD-10-CM

## 2016-07-03 HISTORY — DX: Tubulo-interstitial nephritis, not specified as acute or chronic: N12

## 2016-07-03 LAB — URINALYSIS, ROUTINE W REFLEX MICROSCOPIC
BILIRUBIN URINE: NEGATIVE
Glucose, UA: NEGATIVE mg/dL
KETONES UR: 5 mg/dL — AB
Nitrite: NEGATIVE
Protein, ur: 30 mg/dL — AB
Specific Gravity, Urine: 1.008 (ref 1.005–1.030)
pH: 8 (ref 5.0–8.0)

## 2016-07-03 LAB — COMPREHENSIVE METABOLIC PANEL
ALBUMIN: 2.7 g/dL — AB (ref 3.5–5.0)
ALT: 13 U/L — ABNORMAL LOW (ref 14–54)
AST: 17 U/L (ref 15–41)
Alkaline Phosphatase: 88 U/L (ref 38–126)
Anion gap: 12 (ref 5–15)
BILIRUBIN TOTAL: 0.7 mg/dL (ref 0.3–1.2)
BUN: 6 mg/dL (ref 6–20)
CO2: 24 mmol/L (ref 22–32)
Calcium: 8.5 mg/dL — ABNORMAL LOW (ref 8.9–10.3)
Chloride: 108 mmol/L (ref 101–111)
Creatinine, Ser: 0.95 mg/dL (ref 0.44–1.00)
GFR calc Af Amer: >60 mL/min (ref >60)
GFR calc non Af Amer: >60 mL/min (ref >60)
GLUCOSE: 104 mg/dL — AB (ref 65–99)
POTASSIUM: 3.4 mmol/L — AB (ref 3.5–5.1)
Sodium: 144 mmol/L (ref 135–145)
TOTAL PROTEIN: 6.6 g/dL (ref 6.5–8.1)

## 2016-07-03 LAB — CBC WITH DIFFERENTIAL/PLATELET
BASOS PCT: 0 %
Basophils Absolute: 0 10*3/uL (ref 0.0–0.1)
Eosinophils Absolute: 0 10*3/uL (ref 0.0–0.7)
Eosinophils Relative: 0 %
HEMATOCRIT: 28.8 % — AB (ref 36.0–46.0)
Hemoglobin: 9.1 g/dL — ABNORMAL LOW (ref 12.0–15.0)
LYMPHS PCT: 6 %
Lymphs Abs: 0.9 10*3/uL (ref 0.7–4.0)
MCH: 25.7 pg — AB (ref 26.0–34.0)
MCHC: 31.6 g/dL (ref 30.0–36.0)
MCV: 81.4 fL (ref 78.0–100.0)
MONO ABS: 1.1 10*3/uL — AB (ref 0.1–1.0)
MONOS PCT: 7 %
NEUTROS ABS: 13.3 10*3/uL — AB (ref 1.7–7.7)
Neutrophils Relative %: 87 %
Platelets: 237 10*3/uL (ref 150–400)
RBC: 3.54 MIL/uL — ABNORMAL LOW (ref 3.87–5.11)
RDW: 17.5 % — AB (ref 11.5–15.5)
WBC: 15.3 10*3/uL — ABNORMAL HIGH (ref 4.0–10.5)

## 2016-07-03 LAB — LIPASE, BLOOD: LIPASE: 25 U/L (ref 11–51)

## 2016-07-03 LAB — PROTIME-INR
INR: 1.17
Prothrombin Time: 15 seconds (ref 11.4–15.2)

## 2016-07-03 LAB — I-STAT CG4 LACTIC ACID, ED
Lactic Acid, Venous: 1.02 mmol/L (ref 0.5–1.9)
Lactic Acid, Venous: 1.36 mmol/L (ref 0.5–1.9)

## 2016-07-03 LAB — ACETAMINOPHEN LEVEL

## 2016-07-03 MED ORDER — PIPERACILLIN-TAZOBACTAM 3.375 G IVPB
3.3750 g | Freq: Three times a day (TID) | INTRAVENOUS | Status: DC
  Administered 2016-07-04: 3.375 g via INTRAVENOUS
  Filled 2016-07-03 (×2): qty 50

## 2016-07-03 MED ORDER — VANCOMYCIN HCL IN DEXTROSE 1-5 GM/200ML-% IV SOLN
1000.0000 mg | Freq: Three times a day (TID) | INTRAVENOUS | Status: DC
  Administered 2016-07-04: 1000 mg via INTRAVENOUS
  Filled 2016-07-03 (×2): qty 200

## 2016-07-03 MED ORDER — HYDROMORPHONE HCL 1 MG/ML IJ SOLN
1.0000 mg | Freq: Once | INTRAMUSCULAR | Status: AC
  Administered 2016-07-03: 1 mg via INTRAVENOUS
  Filled 2016-07-03: qty 1

## 2016-07-03 MED ORDER — VANCOMYCIN HCL 10 G IV SOLR
1500.0000 mg | Freq: Once | INTRAVENOUS | Status: DC
  Filled 2016-07-03 (×2): qty 1500

## 2016-07-03 MED ORDER — PRENATAL MULTIVITAMIN CH
1.0000 | ORAL_TABLET | Freq: Every day | ORAL | Status: DC
  Administered 2016-07-04 – 2016-07-05 (×2): 1 via ORAL
  Filled 2016-07-03 (×2): qty 1

## 2016-07-03 MED ORDER — IBUPROFEN 600 MG PO TABS
600.0000 mg | ORAL_TABLET | Freq: Four times a day (QID) | ORAL | Status: DC | PRN
  Administered 2016-07-04 – 2016-07-05 (×4): 600 mg via ORAL
  Filled 2016-07-03 (×4): qty 1

## 2016-07-03 MED ORDER — SODIUM CHLORIDE 0.9 % IV BOLUS (SEPSIS)
2000.0000 mL | Freq: Once | INTRAVENOUS | Status: AC
  Administered 2016-07-03: 2000 mL via INTRAVENOUS

## 2016-07-03 MED ORDER — IOPAMIDOL (ISOVUE-300) INJECTION 61%
INTRAVENOUS | Status: AC
  Filled 2016-07-03: qty 100

## 2016-07-03 MED ORDER — VANCOMYCIN HCL 10 G IV SOLR
1500.0000 mg | Freq: Once | INTRAVENOUS | Status: AC
  Administered 2016-07-03: 1500 mg via INTRAVENOUS
  Filled 2016-07-03: qty 1000

## 2016-07-03 MED ORDER — LACTATED RINGERS IV SOLN
INTRAVENOUS | Status: DC
  Administered 2016-07-03: 75 mL/h via INTRAVENOUS

## 2016-07-03 MED ORDER — IBUPROFEN 200 MG PO TABS
600.0000 mg | ORAL_TABLET | Freq: Once | ORAL | Status: AC
  Administered 2016-07-03: 600 mg via ORAL
  Filled 2016-07-03: qty 3

## 2016-07-03 MED ORDER — PIPERACILLIN-TAZOBACTAM 3.375 G IVPB 30 MIN
3.3750 g | Freq: Once | INTRAVENOUS | Status: AC
  Administered 2016-07-03: 3.375 g via INTRAVENOUS
  Filled 2016-07-03: qty 50

## 2016-07-03 MED ORDER — IOPAMIDOL (ISOVUE-300) INJECTION 61%
100.0000 mL | Freq: Once | INTRAVENOUS | Status: AC | PRN
  Administered 2016-07-03: 100 mL via INTRAVENOUS

## 2016-07-03 MED ORDER — HYDROCODONE-ACETAMINOPHEN 5-325 MG PO TABS
1.0000 | ORAL_TABLET | ORAL | Status: DC | PRN
  Administered 2016-07-03: 2 via ORAL
  Administered 2016-07-04: 1 via ORAL
  Administered 2016-07-04 – 2016-07-05 (×2): 2 via ORAL
  Administered 2016-07-05: 1 via ORAL
  Administered 2016-07-06: 2 via ORAL
  Filled 2016-07-03: qty 1
  Filled 2016-07-03: qty 2
  Filled 2016-07-03: qty 1
  Filled 2016-07-03 (×3): qty 2
  Filled 2016-07-03: qty 1

## 2016-07-03 MED ORDER — VANCOMYCIN HCL IN DEXTROSE 1-5 GM/200ML-% IV SOLN
1000.0000 mg | Freq: Once | INTRAVENOUS | Status: AC
  Administered 2016-07-03: 1000 mg via INTRAVENOUS
  Filled 2016-07-03: qty 200

## 2016-07-03 NOTE — ED Notes (Signed)
[facility] [phone]. Was told by Luanna ColeBriana that the unit did not have a bed for this patient at that time. Luanna ColeBriana states the oncoming night shift charge nurse will call [facility] with a room.

## 2016-07-03 NOTE — ED Notes (Signed)
Patient transported to CT 

## 2016-07-03 NOTE — ED Notes (Signed)
Bed: [id number] Expected date:  Expected time:  Means of arrival:  Comments: 31 yo c section post op, fever

## 2016-07-03 NOTE — ED Triage Notes (Signed)
Patient BIB [facility] due to right sided flank pain, chills, and fever onset of last night. [facility] post[facility]op complications from c-section on the 4th. Pt took 2 tylenol at 1300.

## 2016-07-03 NOTE — ED Notes (Signed)
Patient placed on 3LNC due to decreased sats.

## 2016-07-03 NOTE — H&P (Signed)
Jacqueline Clarke is an 31 y.o. female. [id number] 10 days postpartum after vaginal/cesarean delivery of twins at 35 weeks. [facility] with increasing back pain and chills, found to have fever to 103 and WBC 15K. Urinalysis had many WBC and RBC.   Pertinent Gynecological History:  Bleeding: lochia Contraception: none DES exposure: denies Blood transfusions: none Sexually transmitted diseases: no past history Last pap: normal Date: 12/2015 OB History: [id number]   Menstrual History:  No LMP recorded.    Past Medical History:  Diagnosis Date  . Anemia   . Anxiety   . Asthma   . Blood type, Rh negative   . Cholelithiasis   . Chronic back pain    scoliosis- on percocet  . Depression   . Headache(784.0)   . Hyperemesis complicating pregnancy, antepartum 2013  . 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 Obesity   . Pregnancy induced hypertension   . Scoliosis     Past Surgical History:  Procedure Laterality Date  . arm surgery    . CESAREAN SECTION N/A 06/23/2016   Procedure: CESAREAN SECTION;  Surgeon: [place] BingPickens, Charlie, MD;  [facility];  Service: Obstetrics;  Laterality: N/A;  . CHOLECYSTECTOMY    . DILATION AND CURETTAGE OF UTERUS  2008  . MR WRIST RIGHT     sx to nerves  . NERVE, TENDON AND ARTERY REPAIR Right 06/18/2014   Procedure: NERVE, TENDON AND ARTERY REPAIR;  Surgeon: Bradly BienenstockFred Ortmann, MD;  Location: [facility] OR;  Service: Orthopedics;  Laterality: Right;  . TONSILLECTOMY AND ADENOIDECTOMY    . VAGINAL DELIVERY  06/23/2016   Procedure: VAGINAL DELIVERY;  Surgeon: [place] BingPickens, Charlie, MD;  [facility];  Service: Obstetrics;;    Family History  Problem Relation Age of Onset  . Heart disease Maternal Grandfather   . Diabetes Maternal Grandfather   . Heart disease Father   . Anesthesia problems Neg Hx     Social History:  reports that she quit smoking about 6 months ago. Her smoking use included Cigarettes. She smoked  0.50 packs per day. She has never used smokeless tobacco. She reports that she does not drink alcohol or use drugs.  Allergies: No Known Allergies  Prescriptions Prior to Admission  Medication Sig Dispense Refill Last Dose  . acetaminophen (TYLENOL) 325 MG tablet Take 650 mg by mouth every 6 (six) hours as needed for moderate pain.   07/03/2016 at Unknown time  . clotrimazole (GYNE-LOTRIMIN) 1 % vaginal cream Place 1 Applicatorful vaginally at bedtime. 45 g 0 Past Week at Unknown time  . cyclobenzaprine (FLEXERIL) 10 MG tablet Take 1 tablet by mouth 3 (three) times daily as needed for muscle spasms.   3 07/03/2016 at Unknown time  . docusate sodium (COLACE) 100 MG capsule Take 1 capsule (100 mg total) by mouth 2 (two) times daily as needed. 30 capsule 2 07/03/2016 at Unknown time  . ferrous gluconate (FERGON) 324 MG tablet Take 1 tablet (324 mg total) by mouth 2 (two) times daily with a meal. 80 tablet 0 07/02/2016 at Unknown time  . ibuprofen (ADVIL,MOTRIN) 600 MG tablet Take 1 tablet (600 mg total) by mouth every 6 (six) hours as needed. 30 tablet 0 07/03/2016 at Unknown time  . sertraline (ZOLOFT) 50 MG tablet Take 1 tablet (50 mg total) by mouth daily. Take 1/2 tablet qd x 7 days and then increase to 1 whole tablet qd (Patient taking differently: Take 50 mg by mouth  at bedtime. ) 30 tablet 2 07/02/2016 at Unknown time  . docusate sodium (COLACE) 100 MG capsule Take 1 capsule (100 mg total) by mouth 2 (two) times daily. (Patient not taking: Reported on 07/03/2016) 10 capsule 0 Completed Course at Unknown time  . HYDROmorphone (DILAUDID) 2 MG tablet Take 1 tablet (2 mg total) by mouth every 4 (four) hours as needed for moderate pain or severe pain. (Patient not taking: Reported on 07/03/2016) 30 tablet 0 Completed Course at Unknown time  . polyethylene glycol (MIRALAX / GLYCOLAX) packet Take 17 g by mouth daily. (Patient not taking: Reported on 07/03/2016) 30 packet 3 Completed Course at Unknown time  .  Prenatal Vit-Fe Fumarate-FA (PRENATAL MULTIVITAMIN) TABS tablet Take 1 tablet by mouth daily at 12 noon. (Patient not taking: Reported on 07/03/2016) 30 tablet 6 Completed Course at Unknown time    ROS  Blood pressure 131/85, pulse (!) 141, temperature (!) 102.9 F (39.4 C), temperature source Oral, resp. rate 20, height 5\' 11"  (1.803 m), weight (!) 319 lb (144.7 kg), SpO2 98 %, not currently breastfeeding. Physical Exam  Vitals reviewed. Constitutional: She is oriented to person, place, and time. She appears well-developed. No distress.  Respiratory: Effort normal. No respiratory distress.  GI: Soft. She exhibits no mass. There is tenderness (minimal). There is no rebound and no guarding.  Mild to mod RCVAT  Musculoskeletal: Normal range of motion.  Neurological: She is alert and oriented to person, place, and time.  Skin: Skin is warm.    Results for orders placed or performed during the hospital encounter of 07/03/16 (from the past 24 hour(s))  Comprehensive metabolic panel     Status: Abnormal   Collection Time: 07/03/16  2:55 PM  Result Value Ref Range   Sodium 144 135 - 145 mmol/L   Potassium 3.4 (L) 3.5 - 5.1 mmol/L   Chloride 108 101 - 111 mmol/L   CO2 24 22 - 32 mmol/L   Glucose, Bld 104 (H) 65 - 99 mg/dL   BUN 6 6 - 20 mg/dL   Creatinine, Ser 1.61 0.44 - 1.00 mg/dL   Calcium 8.5 (L) 8.9 - 10.3 mg/dL   Total Protein 6.6 6.5 - 8.1 g/dL   Albumin 2.7 (L) 3.5 - 5.0 g/dL   AST 17 15 - 41 U/L   ALT 13 (L) 14 - 54 U/L   Alkaline Phosphatase 88 38 - 126 U/L   Total Bilirubin 0.7 0.3 - 1.2 mg/dL   GFR calc non Af Amer >60 >60 mL/min   GFR calc Af Amer >60 >60 mL/min   Anion gap 12 5 - 15  CBC with Differential     Status: Abnormal   Collection Time: 07/03/16  2:55 PM  Result Value Ref Range   WBC 15.3 (H) 4.0 - 10.5 K/uL   RBC 3.54 (L) 3.87 - 5.11 MIL/uL   Hemoglobin 9.1 (L) 12.0 - 15.0 g/dL   HCT 09.6 (L) 04.5 - 40.9 %   MCV 81.4 78.0 - 100.0 fL   MCH 25.7 (L) 26.0 -  34.0 pg   MCHC 31.6 30.0 - 36.0 g/dL   RDW 81.1 (H) 91.4 - 78.2 %   Platelets 237 150 - 400 K/uL   Neutrophils Relative % 87 %   Neutro Abs 13.3 (H) 1.7 - 7.7 K/uL   Lymphocytes Relative 6 %   Lymphs Abs 0.9 0.7 - 4.0 K/uL   Monocytes Relative 7 %   Monocytes Absolute 1.1 (H) 0.1 - 1.0 K/uL  Eosinophils Relative 0 %   Eosinophils Absolute 0.0 0.0 - 0.7 K/uL   Basophils Relative 0 %   Basophils Absolute 0.0 0.0 - 0.1 K/uL  Protime-INR     Status: None   Collection Time: 07/03/16  2:55 PM  Result Value Ref Range   Prothrombin Time 15.0 11.4 - 15.2 seconds   INR 1.17   Urinalysis, Routine w reflex microscopic     Status: Abnormal   Collection Time: 07/03/16  2:55 PM  Result Value Ref Range   Color, Urine YELLOW YELLOW   APPearance CLOUDY (A) CLEAR   Specific Gravity, Urine 1.008 1.005 - 1.030   pH 8.0 5.0 - 8.0   Glucose, UA NEGATIVE NEGATIVE mg/dL   Hgb urine dipstick MODERATE (A) NEGATIVE   Bilirubin Urine NEGATIVE NEGATIVE   Ketones, ur 5 (A) NEGATIVE mg/dL   Protein, ur 30 (A) NEGATIVE mg/dL   Nitrite NEGATIVE NEGATIVE   Leukocytes, UA LARGE (A) NEGATIVE   RBC / HPF 6-30 0 - 5 RBC/hpf   WBC, UA TOO NUMEROUS TO COUNT 0 - 5 WBC/hpf   Bacteria, UA RARE (A) NONE SEEN   Squamous Epithelial / LPF 0-5 (A) NONE SEEN   WBC Clumps PRESENT    Mucous PRESENT   Lipase, blood     Status: None   Collection Time: 07/03/16  2:55 PM  Result Value Ref Range   Lipase 25 11 - 51 U/L  I-Stat CG4 Lactic Acid, ED     Status: None   Collection Time: 07/03/16  3:06 PM  Result Value Ref Range   Lactic Acid, Venous 1.02 0.5 - 1.9 mmol/L  Acetaminophen level     Status: Abnormal   Collection Time: 07/03/16  3:29 PM  Result Value Ref Range   Acetaminophen (Tylenol), Serum <10 (L) 10 - 30 ug/mL  I-Stat CG4 Lactic Acid, ED     Status: None   Collection Time: 07/03/16  6:16 PM  Result Value Ref Range   Lactic Acid, Venous 1.36 0.5 - 1.9 mmol/L    Dg Chest 2 View  Result Date:  07/03/2016 CLINICAL DATA:  Fevers and chills EXAM: CHEST  2 VIEW COMPARISON:  06/27/2016 FINDINGS: Cardiac shadow remains mildly enlarged. The lungs are well aerated bilaterally. No focal infiltrate or sizable effusion is seen. No bony abnormality is noted. IMPRESSION: No acute abnormality noted. Electronically Signed   By: Alcide Clever M.D.   On: 07/03/2016 15:58   Ct Abdomen Pelvis W Contrast  Result Date: 07/03/2016 CLINICAL DATA:  Right flank pain and fever since last night. Recently discharged from [facility] following postoperative complications from a C-section on 06/23/2016. EXAM: CT ABDOMEN AND PELVIS WITH CONTRAST TECHNIQUE: Multidetector CT imaging of the abdomen and pelvis was performed using the standard protocol following bolus administration of intravenous contrast. CONTRAST:  ISOVUE-300 IOPAMIDOL (ISOVUE-300) INJECTION 61% COMPARISON:  Abdomen and pelvis CT dated 11/09/2012. Chest and abdomen radiographs dated 06/27/2016. FINDINGS: Lower chest: Minimal left basilar atelectasis. Hepatobiliary: Cholecystectomy clips.  Normal appearing liver. Pancreas: Unremarkable. No pancreatic ductal dilatation or surrounding inflammatory changes. Spleen: Normal in size.  Minimally heterogeneous. Adrenals/Urinary Tract: Adrenal glands are unremarkable. Kidneys are normal, without renal calculi, focal lesion, or hydronephrosis. Bladder is unremarkable.Normal appearing adrenal glands. 4 mm lower pole right renal calculus. Normal appearing urinary bladder, ureters and left kidney. No hydronephrosis. Stomach/Bowel: Stomach is within normal limits. Appendix appears normal. No evidence of bowel wall thickening, distention, or inflammatory changes. Vascular/Lymphatic: No significant vascular findings are present.  No enlarged abdominal or pelvic lymph nodes. Reproductive: Diffusely enlarged uterus with mildly heterogeneous central low density and mild heterogeneity at the location of the C-section incision in  the anterior aspect of the lower uterine segment. Other: Bilateral subcutaneous edema. Small amount of air in the subcutaneous fat on the left near the incision site. No fluid collections seen. Musculoskeletal: Mild lower thoracic spine degenerative changes. IMPRESSION: 1. 4 mm nonobstructing lower pole right renal calculus. 2. Enlarged uterus with mildly heterogeneous central low density and mild heterogeneity in the lower uterine segment incision site. This could represent normal postpartum and post surgical changes. Infection cannot be excluded. 3. Mild bilateral subcutaneous edema. Electronically Signed   By: Beckie Salts M.D.   On: 07/03/2016 17:40    Assessment/Plan: Postoperative day 10 after CS Fever, WBC elevated and pyuria and tenderness most suspicious for pyeonephritis. Will continue present antibiotic and evaluate in the morning. Scheryl Darter 07/03/2016, 8:36 PM

## 2016-07-03 NOTE — ED Provider Notes (Signed)
[facility]-EMERGENCY DEPT Provider Note   CSN: [id number] Arrival date & time: 07/03/16  1427     History   Chief Complaint Chief Complaint  Patient presents with  . Flank Pain    HPI Jacqueline Clarke is a 31 y.o. female with a PMHx of anemia, chronic back pain, chronic headaches, obesity, and HTN, who presents to the ED with complaints of gradual onset worsening right flank pain that became more severe last night. Chart review reveals she recently delivered twins at [redacted]w[redacted]d on 06/23/16, Baby A via SVD, and Baby B via LTCS due to abruption (did have an epidural for this). She was discharged 06/26/16 and then readmitted on 06/27/16 for postop ileus, had NGT placed and did well, discharged 06/30/16. She states that after she was discharged on the 11th she had some mild low back pain that she attributed to the fluids that she was given during her hospital stay, she was taking Tylenol and dilaudid with some relief, however last night it became more severe and she developed subjective fevers, chills, increased urinary frequency, and tingling down her right leg. She describes the pain as 10/10 constant aching and throbbing right flank pain that radiates into her right upper abdomen and down her right leg, worse with walking and breathing, and unrelieved with Dilaudid 2 mg, Tylenol 1000mg  taken at 6 AM and 1 PM, and 1200 mg ibuprofen at 12 PM. She also mentions that last night she noticed that the pad that she had over her abdominal incision had some sticky teary on it so she believes there may be some drainage from the wound. She has noticed increased redness in that area. She states that during her recent hospitalization they gave her Monistat for some mild vaginal itching which is improving. She continues to have vaginal bleeding which is same as lochia and without passage of clots. Her OBGYN is Dr. Clearance Coots, however her C/S was done by Dr. Vergie Living since Dr. Clearance Coots no longer delivers babies. Her PCP is at [facility] in [place]. NKDA. No known sick contacts.   She denies cough, URI symptoms, CP, SOB, N/V/D/C, melena, hematochezia, warmth to the incision, hematuria, dysuria, vaginal discharge, focal arthralgias, numbness, focal weakness, or any other complaints at this time.    The history is provided by the patient and medical records. No language interpreter was used.  Flank Pain  This is a new problem. The current episode started yesterday. The problem occurs constantly. The problem has been gradually worsening. Associated symptoms include abdominal pain. Pertinent negatives include no chest pain and no shortness of breath. The symptoms are aggravated by walking (and breathing). Nothing relieves the symptoms. She has tried acetaminophen (tylenol 1000mg  @6am  and 1pm, ibuprofen 1200mg  @12pm , and dilaudid 2mg  tabs) for the symptoms. The treatment provided no relief.    Past Medical History:  Diagnosis Date  . Anemia   . Anxiety   . Asthma   . Blood type, Rh negative   . Cholelithiasis   . Chronic back pain    scoliosis- on percocet  . Depression   . Headache(784.0)   . Hyperemesis complicating pregnancy, antepartum 2013  . 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 Obesity   . Pregnancy induced hypertension   . Scoliosis     Patient Active Problem List   Diagnosis Date Noted  . Gestational hypertension 06/30/2016  . Postoperative ileus (HCC) 06/27/2016  . Status post primary low transverse cesarean section 06/26/2016  .  Preterm delivery, delivered 06/26/2016  . Indication for care in labor and delivery, antepartum 06/23/2016  . Blood type, Rh negative   . Obesity   . Symptomatic anemia 06/02/2016  . Anemia affecting pregnancy in third trimester 05/22/2016  . Post-concussion headache 04/23/2016  . Encounter for procreative genetic counseling   . Low vitamin D level 01-Feb-2016  . Anti-D antibodies present during pregnancy 01-Feb-2016  . Supervision of high-risk  pregnancy 12/26/2015  . Monochorionic diamniotic twin gestation 12/19/2015  . Depression 07/09/2015    Past Surgical History:  Procedure Laterality Date  . arm surgery    . CESAREAN SECTION N/A 06/23/2016   Procedure: CESAREAN SECTION;  Surgeon: [place] Bing, MD;  Location: [facility] BIRTHING SUITES;  Service: Obstetrics;  Laterality: N/A;  . CHOLECYSTECTOMY    . DILATION AND CURETTAGE OF UTERUS  2008  . MR WRIST RIGHT     sx to nerves  . NERVE, TENDON AND ARTERY REPAIR Right 06/18/2014   Procedure: NERVE, TENDON AND ARTERY REPAIR;  Surgeon: Bradly Bienenstock, MD;  Location: [facility] OR;  Service: Orthopedics;  Laterality: Right;  . TONSILLECTOMY AND ADENOIDECTOMY    . VAGINAL DELIVERY  06/23/2016   Procedure: VAGINAL DELIVERY;  Surgeon: [place] Bing, MD;  Location: [facility] BIRTHING SUITES;  Service: Obstetrics;;    OB History    Gravida Para Term Preterm AB Living   7 5 4 1 2 4    SAB TAB Ectopic Multiple Live Births   2 0 0 1 4       Home Medications    Prior to Admission medications   Medication Sig Start Date End Date Taking? Authorizing Provider  acetaminophen (TYLENOL) 325 MG tablet Take 650 mg by mouth every 6 (six) hours as needed for moderate pain.    [provider]  clotrimazole (GYNE-LOTRIMIN) 1 % vaginal cream Place 1 Applicatorful vaginally at bedtime. 06/30/16   Lorne Skeens, MD  cyclobenzaprine (FLEXERIL) 10 MG tablet Take 1 tablet by mouth 3 (three) times daily as needed for muscle spasms.  04/15/16   [provider]  docusate sodium (COLACE) 100 MG capsule Take 1 capsule (100 mg total) by mouth 2 (two) times daily as needed. 06/26/16 06/26/17  Leftwich-Kirby, Wilmer Floor, CNM  docusate sodium (COLACE) 100 MG capsule Take 1 capsule (100 mg total) by mouth 2 (two) times daily. 06/30/16   Lorne Skeens, MD  ferrous gluconate (FERGON) 324 MG tablet Take 1 tablet (324 mg total) by mouth 2 (two) times daily with a meal. 06/30/16   [place] Bing, MD    HYDROmorphone (DILAUDID) 2 MG tablet Take 1 tablet (2 mg total) by mouth every 4 (four) hours as needed for moderate pain or severe pain. 06/26/16   Leftwich-Kirby, Wilmer Floor, CNM  ibuprofen (ADVIL,MOTRIN) 600 MG tablet Take 1 tablet (600 mg total) by mouth every 6 (six) hours as needed. 06/30/16   [place] Bing, MD  polyethylene glycol (MIRALAX / GLYCOLAX) packet Take 17 g by mouth daily. 06/30/16    Bing, MD  Prenatal Vit-Fe Fumarate-FA (PRENATAL MULTIVITAMIN) TABS tablet Take 1 tablet by mouth daily at 12 noon. 06/04/16   Constant, Peggy, MD  sertraline (ZOLOFT) 50 MG tablet Take 1 tablet (50 mg total) by mouth daily. Take 1/2 tablet qd x 7 days and then increase to 1 whole tablet qd Patient taking differently: Take 50 mg by mouth at bedtime.  05/15/16   Hermina Staggers, MD    Family History Family History  Problem Relation Age of  Onset  . Heart disease Maternal Grandfather   . Diabetes Maternal Grandfather   . Heart disease Father   . Anesthesia problems Neg Hx     Social History Social History  Substance Use Topics  . Smoking status: Former Smoker    Packs/day: 0.50    Types: Cigarettes    Quit date: 12/05/2015  . Smokeless tobacco: Never Used  . Alcohol use No     Comment: Occassionally     Allergies   Patient has no known allergies.   Review of Systems Review of Systems  Constitutional: Positive for chills and fever.  HENT: Negative for rhinorrhea and sore throat.   Respiratory: Negative for cough and shortness of breath.   Cardiovascular: Positive for leg swelling (since leaving hospital). Negative for chest pain.  Gastrointestinal: Positive for abdominal pain. Negative for blood in stool, constipation, diarrhea, nausea and vomiting.  Genitourinary: Positive for flank pain, frequency and vaginal bleeding (same as lochia, no passage of clots). Negative for dysuria, hematuria and vaginal discharge.  Musculoskeletal: Positive for back pain. Negative for  arthralgias.  Skin: Positive for color change (erythema to incision) and wound (surgical incision).  Allergic/Immunologic: Negative for immunocompromised state.  Neurological: Negative for weakness and numbness.       +tingling R leg  Psychiatric/Behavioral: Negative for confusion.   All other systems reviewed and are negative for acute change except as noted in the HPI.    Physical Exam Updated Vital Signs BP (!) 150/95 (BP Location: Left Arm)   Pulse (!) 115   Temp (!) 103.2 F (39.6 C) (Oral)   Resp (!) 33   Ht 5\' 11"  (1.803 m)   Wt (!) 144.7 kg (319 lb)   SpO2 96%   BMI 44.49 kg/m   Physical Exam  Constitutional: She is oriented to person, place, and time. She appears well-developed and well-nourished.  Non-toxic appearance. She appears distressed (appears uncomfortable).  Febrile 103.2, nontoxic however doesn't appear to feel well, uncomfortable laying in bed; morbidly obese woman  HENT:  Head: Normocephalic and atraumatic.  Mouth/Throat: Oropharynx is clear and moist. Mucous membranes are dry.  Very dry lips  Eyes: Conjunctivae and EOM are normal. Right eye exhibits no discharge. Left eye exhibits no discharge.  Slight conjunctival pallor  Neck: Normal range of motion. Neck supple.  Cardiovascular: Regular rhythm, normal heart sounds and intact distal pulses.  Tachycardia present.  Exam reveals no gallop and no friction rub.   No murmur heard. HR 110-120s  Pulmonary/Chest: Effort normal and breath sounds normal. No respiratory distress. She has no decreased breath sounds. She has no wheezes. She has no rhonchi. She has no rales.  Abdominal: Soft. Bowel sounds are normal. She exhibits no distension. There is tenderness in the right upper quadrant, right lower quadrant, suprapubic area and left upper quadrant. There is CVA tenderness. There is no rigidity, no rebound, no guarding, no tenderness at McBurney's point and negative Murphy's sign.    Soft, obese which severely  limits exam but no obvious distension, +BS throughout, with moderate TTP in the RUQ and RLQ tracking towards the R flank area, and some milder TTP to the LUQ and suprapubic areas, no r/g/r, neg murphy's however palpation of the RUQ elicits pain but she is able to fully inspire, no focal mcburney's TTP, +R sided CVA TTP  Suprapubic incision with staples intact, some mild surrounding erythema and warmth, slightly macerated skin with some creamy whiteish drainage, but no drainage specifically coming from the incision itself.  Musculoskeletal: Normal range of motion.       Lumbar back: She exhibits tenderness and spasm. She exhibits normal range of motion, no bony tenderness, no swelling and no deformity.       Back:  MAE x4 Strength and sensation grossly intact in all extremities Distal pulses intact 2-3+ b/l pedal edema, neg homan's bilaterally  Lumbar spine with essentially FROM intact without focal spinous process TTP, no bony stepoffs or deformities, small bruise to L3-4 region but no swelling/warmth/erythema/drainage, with somewhat diffuse b/l paraspinous muscle TTP but R>L, and palpable R sided muscle spasms. +SLR on R side  Neurological: She is alert and oriented to person, place, and time. She has normal strength. No sensory deficit.  Skin: Skin is warm, dry and intact. Bruising noted. No rash noted. There is erythema.  Erythema around surgical incision as mentioned above in Abd exam; bruise to lumbar spine as mentioned above in MsK exam  Psychiatric: She has a normal mood and affect.  Nursing note and vitals reviewed.    ED Treatments / Results  Labs (all labs ordered are listed, but only abnormal results are displayed) Labs Reviewed  COMPREHENSIVE METABOLIC PANEL - Abnormal; Notable for the following:       Result Value   Potassium 3.4 (*)    Glucose, Bld 104 (*)    Calcium 8.5 (*)    Albumin 2.7 (*)    ALT 13 (*)    All other components within normal limits  CBC WITH  DIFFERENTIAL/PLATELET - Abnormal; Notable for the following:    WBC 15.3 (*)    RBC 3.54 (*)    Hemoglobin 9.1 (*)    HCT 28.8 (*)    MCH 25.7 (*)    RDW 17.5 (*)    Neutro Abs 13.3 (*)    Monocytes Absolute 1.1 (*)    All other components within normal limits  URINALYSIS, ROUTINE W REFLEX MICROSCOPIC - Abnormal; Notable for the following:    APPearance CLOUDY (*)    Hgb urine dipstick MODERATE (*)    Ketones, ur 5 (*)    Protein, ur 30 (*)    Leukocytes, UA LARGE (*)    Bacteria, UA RARE (*)    Squamous Epithelial / LPF 0-5 (*)    All other components within normal limits  ACETAMINOPHEN LEVEL - Abnormal; Notable for the following:    Acetaminophen (Tylenol), Serum <10 (*)    All other components within normal limits  CULTURE, BLOOD (ROUTINE X 2)  CULTURE, BLOOD (ROUTINE X 2)  URINE CULTURE  PROTIME-INR  LIPASE, BLOOD  I-STAT CG4 LACTIC ACID, ED  I-STAT CG4 LACTIC ACID, ED    EKG  EKG Interpretation None       Radiology Dg Chest 2 View  Result Date: 07/03/2016 CLINICAL DATA:  Fevers and chills EXAM: CHEST  2 VIEW COMPARISON:  06/27/2016 FINDINGS: Cardiac shadow remains mildly enlarged. The lungs are well aerated bilaterally. No focal infiltrate or sizable effusion is seen. No bony abnormality is noted. IMPRESSION: No acute abnormality noted. Electronically Signed   By: Alcide Clever M.D.   On: 07/03/2016 15:58   Ct Abdomen Pelvis W Contrast  Result Date: 07/03/2016 CLINICAL DATA:  Right flank pain and fever since last night. Recently discharged from [facility] following postoperative complications from a C-section on 06/23/2016. EXAM: CT ABDOMEN AND PELVIS WITH CONTRAST TECHNIQUE: Multidetector CT imaging of the abdomen and pelvis was performed using the standard protocol following bolus administration of intravenous contrast. CONTRAST:  100mL ISOVUE-300 IOPAMIDOL (ISOVUE-300) INJECTION 61% COMPARISON:  Abdomen and pelvis CT dated 11/09/2012. Chest and abdomen  radiographs dated 06/27/2016. FINDINGS: Lower chest: Minimal left basilar atelectasis. Hepatobiliary: Cholecystectomy clips.  Normal appearing liver. Pancreas: Unremarkable. No pancreatic ductal dilatation or surrounding inflammatory changes. Spleen: Normal in size.  Minimally heterogeneous. Adrenals/Urinary Tract: Adrenal glands are unremarkable. Kidneys are normal, without renal calculi, focal lesion, or hydronephrosis. Bladder is unremarkable.Normal appearing adrenal glands. 4 mm lower pole right renal calculus. Normal appearing urinary bladder, ureters and left kidney. No hydronephrosis. Stomach/Bowel: Stomach is within normal limits. Appendix appears normal. No evidence of bowel wall thickening, distention, or inflammatory changes. Vascular/Lymphatic: No significant vascular findings are present. No enlarged abdominal or pelvic lymph nodes. Reproductive: Diffusely enlarged uterus with mildly heterogeneous central low density and mild heterogeneity at the location of the C-section incision in the anterior aspect of the lower uterine segment. Other: Bilateral subcutaneous edema. Small amount of air in the subcutaneous fat on the left near the incision site. No fluid collections seen. Musculoskeletal: Mild lower thoracic spine degenerative changes. IMPRESSION: 1. 4 mm nonobstructing lower pole right renal calculus. 2. Enlarged uterus with mildly heterogeneous central low density and mild heterogeneity in the lower uterine segment incision site. This could represent normal postpartum and post surgical changes. Infection cannot be excluded. 3. Mild bilateral subcutaneous edema. Electronically Signed   By: Beckie SaltsSteven  Reid M.D.   On: 07/03/2016 17:40    Procedures Procedures (including critical care time)  CRITICAL CARE-- sepsis Performed by: Rhona RaiderMercedes Breeonna Mone   Total critical care time: 60 minutes  Critical care time was exclusive of separately billable procedures and treating other patients.  Critical care  was necessary to treat or prevent imminent or life-threatening deterioration.  Critical care was time spent personally by me on the following activities: development of treatment plan with patient and/or surrogate as well as nursing, discussions with consultants, evaluation of patient's response to treatment, examination of patient, obtaining history from patient or surrogate, ordering and performing treatments and interventions, ordering and review of laboratory studies, ordering and review of radiographic studies, pulse oximetry and re-evaluation of patient's condition.  Medications Ordered in ED Medications  vancomycin (VANCOCIN) IVPB 1000 mg/200 mL premix (1,000 mg Intravenous New Bag/Given 07/03/16 1752)  iopamidol (ISOVUE-300) 61 % injection (not administered)  sodium chloride 0.9 % bolus 2,000 mL (2,000 mLs Intravenous New Bag/Given 07/03/16 1531)  HYDROmorphone (DILAUDID) injection 1 mg (1 mg Intravenous Given 07/03/16 1531)  piperacillin-tazobactam (ZOSYN) IVPB 3.375 g (0 g Intravenous Stopped 07/03/16 1710)  HYDROmorphone (DILAUDID) injection 1 mg (1 mg Intravenous Given 07/03/16 1641)  iopamidol (ISOVUE-300) 61 % injection 100 mL (100 mLs Intravenous Contrast Given 07/03/16 1652)  ibuprofen (ADVIL,MOTRIN) tablet 600 mg (600 mg Oral Given 07/03/16 1757)  HYDROmorphone (DILAUDID) injection 1 mg (1 mg Intravenous Given 07/03/16 1757)     Initial Impression / Assessment and Plan / ED Course  I have reviewed the triage vital signs and the nursing notes.  Pertinent labs & imaging results that were available during my care of the patient were reviewed by me and considered in my medical decision making (see chart for details).     31 y.o. female here with fever and R flank pain x1 day, 10 days post op from LTCS of Baby B after SVD of Baby A. Had epidural for this. Was admitted 6/8-11/18 for post-op ileus and states she had some milder R low back pain afterwards, but it worsened yesterday. Had  some increased urine frequency  and tingling in R leg. Also noticed yesterday that the pad she's keeping on her c/s incision was sticky so she thinks there may be some drainage. On exam, febrile 103.2 (unfortunately she's already taken 1000mg  tylenol at 6am and 1pm and 1200mg  ibuprofen at 12pm, so we're maxxed out on antipyretics for now), tachycardic in the 110-120s, appears uncomfortable, dry lips, +RUQ and RLQ TTP, some milder LUQ TTP, nonperitoneal and no focal mcburney's point tenderness, neg murphy's although she complains of pain but is able to fully inspire, skin surrounding incision appears slightly macerated and erythematous, slightly warm to touch, with creamy drainage from around the incision but no definite drainage arising from the wound itself, staples intact; clear lung exam, +R CVA TTP, mild tenderness diffusely along lumbar spinal area without focal bony tenderness, small bruise over epidural site but no swelling/warmth/redness noted, +SLR on R side, distal strength/sensation grossly intact, b/l legs with 2-3+ edema which she states is similar to how it was when she left the hospital.   There are a multitude of differential etiologies of her symptoms, including but not limited to: pyelonephritis, post-op infection/intraabdominal infection, epidural abscess, etc. Sepsis orders initiated immediately, will get CXR, cultures, labs including PT/INR and acetaminophen level, CT Abd/pelv, and give pain meds and fluids based on IBW of 70kg. As soon as we can give her an antipyretic, will attempt to do so. If no clear source on initial work up is found, may need to consider MRI back to r/o epidural abscess, but will hold off on this for now. Will monitor closely. Discussed case with my attending Dr. Jacqulyn Bath who agrees with plan.   5:11 PM Pt in CT scan now. CBC w/diff with neutrophilic leukocytosis and stable anemia. CMP with marginally low K 3.4 doubt need for repletion, albumin low at 2.7 which could  explain why she seems to be retaining fluid in her extremities. Lipase WNL. U/A with large leuks, TNTC WBC, 6-30 RBCs, rare bacteria and only 0-5 squamous, but WBC clumps noted, so urinary source still possible however not definite; UCx in process. Acetaminophen level WNL. INR WNL. Lactic reassuringly WNL. CXR neg. Will reassess after she gets back from CT scanner.   5:49 PM Reassessed pt, stating pain improved after last dose of dilaudid, but now coming back; will give another dose. Will also give ibuprofen since it's been nearly 6hrs since last dose of that; still can't give tylenol yet. CT just resulted, showing 4mm nonobstructing stone in R renal lower pole, and enlarged uterus with heterogeneous central low density and to lower segment of uterine incision which could be post-surgical change but infection can't be excluded. Will touch base with OBGYN service to see if we want to possibly MRI her lumbar spine to r/o epidural abscess, vs use this as a source of her fever. Of note, EKG being done now. Will reassess shortly.   Sepsis - Repeat Assessment Performed at:    5:49 PM  Vitals     Blood pressure (!) 158/86, pulse (!) 124, temperature (!) 103.1 F (done in room just now), temperature source Oral, resp. rate (!) 21, height 5\' 11"  (1.803 m), weight (!) 144.7 kg (319 lb), SpO2 92 %, not currently breastfeeding.  Heart:     Tachycardic Lungs:    CTA Capillary Refill:   <2 sec Peripheral Pulse:   Dorsalis pedis pulse  palpable Skin:     Normal Color   6:08 PM Dr. Debroah Loop of OBGYN faculty service returning page, doesn't feel we  need to do MRI lumbar spine since this would be exceedingly rare, and more likely it's post-op infection. He agreed to admit directly to the [facility] hospital, will transfer her there for direct admit. Holding orders to be placed by admitting team. Please see their notes for further documentation of care. I appreciate their help with this pleasant pt's care. Pt stable at time  of admission.    Final Clinical Impressions(s) / ED Diagnoses   Final diagnoses:  Sepsis, due to unspecified organism [facility])  Right flank pain  Neutrophilic leukocytosis  Chronic anemia  Hypoalbuminemia  Postoperative infection, initial encounter  Fever of undetermined origin    New Prescriptions New Prescriptions   No medications on [address] [place], [place] 07/03/16 1810    Maia Plan, MD 07/03/16 1859

## 2016-07-03 NOTE — Progress Notes (Signed)
Pharmacy Antibiotic Note  Jacqueline Clarke is a 31 y.o. female admitted on 07/03/2016 with sepsis.  Pharmacy has been consulted for vancomycin and zosyn dosing. Patient s/p C-section 6/5 (dischaged 6/7).  [facility] 6/8 with N/V, abdominal pain d/t ileus (discharged 6/11).  [facility]14 with sepsis and concern for post-op wound infection.   Today, 07/03/2016  Renal: SCr = 0.95 (slightly up from recent hospitalization)  WBC increased  Tm = 103.2  Plan:   Vancomycin 1gm x 1 in ED then give additional vancomycin 1.5gm x 1 to make 2.5gm IV loading dose.  Then start 1gm IV q8h for estimated trough of 14.5 mcg/mL   Check vancomycin trough   Zosyn 3.375gm IV q8h over 4h infusion  If above regimen continued, check daily SCr due to increased risk of nephrotoxicity with this combination of antibiotics  Consider change zosyn to cefepime to reduce risk if nephrotoxicity  F/u ability to narrow antibiotics with culture results  Height: 5\' 11"  (180.3 cm) Weight: (!) 319 lb (144.7 kg) IBW/kg (Calculated) : 70.8  Temp (24hrs), Avg:103.2 F (39.6 C), Min:103.2 F (39.6 C), Max:103.2 F (39.6 C)   Recent Labs Lab 06/27/16 1909 06/27/16 2200 06/28/16 0521 06/28/16 1549 06/29/16 0853 06/30/16 1141 07/03/16 1455 07/03/16 1506  WBC 10.9*  --  11.7*  --   --  9.4 15.3*  --   CREATININE  --  0.69  --  0.78 0.71 0.70 0.95  --   LATICACIDVEN  --   --   --   --   --   --   --  1.02    Estimated Creatinine Clearance: 138.5 mL/min (by C-G formula based on SCr of 0.95 mg/dL).    No Known Allergies  Antimicrobials this admission: 6/14 vancomycin >> 6/14 zosyn >>  Dose adjustments this admission:  Microbiology results: 6/4 genital cx: + GBS 6/11 C. Difficile: neg/neg 6/14 Bcx: 6/14 UCx:   Thank you for allowing pharmacy to be a part of this patient's care.  Juliette Alcideustin Zeigler, PharmD, BCPS.   Pager: [phone] 07/03/2016 4:29 PM

## 2016-07-03 NOTE — ED Notes (Signed)
Patient transported to x-ray. ?

## 2016-07-04 ENCOUNTER — Ambulatory Visit: Payer: Medicaid Other | Admitting: Obstetrics

## 2016-07-04 ENCOUNTER — Encounter (HOSPITAL_COMMUNITY): Payer: Self-pay | Admitting: *Deleted

## 2016-07-04 LAB — BLOOD CULTURE ID PANEL (REFLEXED)
ACINETOBACTER BAUMANNII: NOT DETECTED
CANDIDA ALBICANS: NOT DETECTED
Candida glabrata: NOT DETECTED
Candida krusei: NOT DETECTED
Candida parapsilosis: NOT DETECTED
Candida tropicalis: NOT DETECTED
Carbapenem resistance: NOT DETECTED
ENTEROBACTER CLOACAE COMPLEX: NOT DETECTED
ENTEROCOCCUS SPECIES: NOT DETECTED
ESCHERICHIA COLI: DETECTED — AB
Enterobacteriaceae species: DETECTED — AB
Haemophilus influenzae: NOT DETECTED
KLEBSIELLA OXYTOCA: NOT DETECTED
Klebsiella pneumoniae: NOT DETECTED
LISTERIA MONOCYTOGENES: NOT DETECTED
NEISSERIA MENINGITIDIS: NOT DETECTED
PSEUDOMONAS AERUGINOSA: NOT DETECTED
Proteus species: NOT DETECTED
STAPHYLOCOCCUS SPECIES: NOT DETECTED
STREPTOCOCCUS AGALACTIAE: NOT DETECTED
STREPTOCOCCUS PNEUMONIAE: NOT DETECTED
STREPTOCOCCUS PYOGENES: NOT DETECTED
Serratia marcescens: NOT DETECTED
Staphylococcus aureus (BCID): NOT DETECTED
Streptococcus species: NOT DETECTED

## 2016-07-04 LAB — CBC
HCT: 25.8 % — ABNORMAL LOW (ref 36.0–46.0)
HEMOGLOBIN: 8.2 g/dL — AB (ref 12.0–15.0)
MCH: 25.9 pg — ABNORMAL LOW (ref 26.0–34.0)
MCHC: 31.8 g/dL (ref 30.0–36.0)
MCV: 81.6 fL (ref 78.0–100.0)
PLATELETS: 195 10*3/uL (ref 150–400)
RBC: 3.16 MIL/uL — AB (ref 3.87–5.11)
RDW: 17.8 % — ABNORMAL HIGH (ref 11.5–15.5)
WBC: 13.8 10*3/uL — AB (ref 4.0–10.5)

## 2016-07-04 MED ORDER — DEXTROSE 5 % IV SOLN
2.0000 g | INTRAVENOUS | Status: DC
  Administered 2016-07-04 – 2016-07-05 (×2): 2 g via INTRAVENOUS
  Filled 2016-07-04 (×2): qty 2

## 2016-07-04 MED ORDER — ZOLPIDEM TARTRATE 5 MG PO TABS
5.0000 mg | ORAL_TABLET | Freq: Every evening | ORAL | Status: DC | PRN
  Administered 2016-07-04: 5 mg via ORAL
  Filled 2016-07-04: qty 1

## 2016-07-04 MED ORDER — ONDANSETRON HCL 4 MG/2ML IJ SOLN
4.0000 mg | Freq: Once | INTRAMUSCULAR | Status: AC
Start: 2016-07-04 — End: 2016-07-04
  Administered 2016-07-04: 4 mg via INTRAVENOUS
  Filled 2016-07-04: qty 2

## 2016-07-04 NOTE — Progress Notes (Signed)
Subjective: Patient reports nausea, tolerating PO and no problems voiding.   Still some right flank pain Objective: I have reviewed patient's vital signs, intake and output, medications and labs. Blood pressure (!) 148/97, pulse (!) 104, temperature 98.5 F (36.9 C), temperature source Oral, resp. rate 18, height 5\' 11"  (1.803 m), weight (!) 319 lb (144.7 kg), SpO2 100 %, not currently breastfeeding.  General: alert, cooperative and no distress Resp: normal effort GI: incision: clean, dry and intact right CVAT  CBC    Component Value Date/Time   WBC 13.8 (H) 07/04/2016 0503   RBC 3.16 (L) 07/04/2016 0503   HGB 8.2 (L) 07/04/2016 0503   HGB 9.2 (L) 05/21/2016 1030   HCT 25.8 (L) 07/04/2016 0503   HCT 29.2 (L) 05/21/2016 1030   PLT 195 07/04/2016 0503   PLT 229 05/21/2016 1030   MCV 81.6 07/04/2016 0503   MCV 82 05/21/2016 1030   MCH 25.9 (L) 07/04/2016 0503   MCHC 31.8 07/04/2016 0503   RDW 17.8 (H) 07/04/2016 0503   RDW 15.9 (H) 05/21/2016 1030   LYMPHSABS 0.9 07/03/2016 1455   LYMPHSABS 2.7 12/26/2015 1552   MONOABS 1.1 (H) 07/03/2016 1455   EOSABS 0.0 07/03/2016 1455   EOSABS 0.1 12/26/2015 1552   BASOSABS 0.0 07/03/2016 1455   BASOSABS 0.0 12/26/2015 1552    Assessment/Plan: Hospital day 2 for pyelonephritis, afebrile this morning  LOS: 1 day    Scheryl DarterJames Arnold 07/04/2016, 6:29 AM

## 2016-07-04 NOTE — Progress Notes (Signed)
.   PHARMACY - PHYSICIAN COMMUNICATION CRITICAL VALUE ALERT - BLOOD CULTURE IDENTIFICATION (BCID)  Results for orders placed or performed during the hospital encounter of 07/03/16  Blood Culture ID Panel (Reflexed) (Collected: 07/03/2016  2:55 PM)  Result Value Ref Range   Enterococcus species NOT DETECTED NOT DETECTED   Listeria monocytogenes NOT DETECTED NOT DETECTED   Staphylococcus species NOT DETECTED NOT DETECTED   Staphylococcus aureus NOT DETECTED NOT DETECTED   Streptococcus species NOT DETECTED NOT DETECTED   Streptococcus agalactiae NOT DETECTED NOT DETECTED   Streptococcus pneumoniae NOT DETECTED NOT DETECTED   Streptococcus pyogenes NOT DETECTED NOT DETECTED   Acinetobacter baumannii NOT DETECTED NOT DETECTED   Enterobacteriaceae species DETECTED (A) NOT DETECTED   Enterobacter cloacae complex NOT DETECTED NOT DETECTED   Escherichia coli DETECTED (A) NOT DETECTED   Klebsiella oxytoca NOT DETECTED NOT DETECTED   Klebsiella pneumoniae NOT DETECTED NOT DETECTED   Proteus species NOT DETECTED NOT DETECTED   Serratia marcescens NOT DETECTED NOT DETECTED   Carbapenem resistance NOT DETECTED NOT DETECTED   Haemophilus influenzae NOT DETECTED NOT DETECTED   Neisseria meningitidis NOT DETECTED NOT DETECTED   Pseudomonas aeruginosa NOT DETECTED NOT DETECTED   Candida albicans NOT DETECTED NOT DETECTED   Candida glabrata NOT DETECTED NOT DETECTED   Candida krusei NOT DETECTED NOT DETECTED   Candida parapsilosis NOT DETECTED NOT DETECTED   Candida tropicalis NOT DETECTED NOT DETECTED    Name of physician (or Provider) Contacted: Dr Debroah LoopArnold  Changes to prescribed antibiotics required: Recommendation was made to narrow to ceftriaxone, however Dr Debroah LoopArnold would like to continue current regimen until clinical picture is more clear. He will continue to monitor and narrow when appropriate.  Jacqueline Clarke, Jacqueline Clarke 07/04/2016  7:10 AM

## 2016-07-04 NOTE — Progress Notes (Signed)
At 8:45pm staples removed as ordered, steri strips applied. Patient tolerated well.  Incision intact, no drainage noted, edges attached and approximated.

## 2016-07-05 ENCOUNTER — Inpatient Hospital Stay (HOSPITAL_COMMUNITY): Payer: No Typology Code available for payment source

## 2016-07-05 LAB — URINE CULTURE: Culture: >=100000 — AB

## 2016-07-05 LAB — CBC WITH DIFFERENTIAL/PLATELET
Basophils Absolute: 0 10*3/uL (ref 0.0–0.1)
Basophils Relative: 0 %
EOS ABS: 0.2 10*3/uL (ref 0.0–0.7)
Eosinophils Relative: 2 %
HCT: 25.5 % — ABNORMAL LOW (ref 36.0–46.0)
HEMOGLOBIN: 8 g/dL — AB (ref 12.0–15.0)
LYMPHS ABS: 1.7 10*3/uL (ref 0.7–4.0)
Lymphocytes Relative: 20 %
MCH: 25.8 pg — AB (ref 26.0–34.0)
MCHC: 31.4 g/dL (ref 30.0–36.0)
MCV: 82.3 fL (ref 78.0–100.0)
MONO ABS: 0.6 10*3/uL (ref 0.1–1.0)
MONOS PCT: 7 %
NEUTROS PCT: 71 %
Neutro Abs: 5.7 10*3/uL (ref 1.7–7.7)
Platelets: 192 10*3/uL (ref 150–400)
RBC: 3.1 MIL/uL — ABNORMAL LOW (ref 3.87–5.11)
RDW: 17.7 % — ABNORMAL HIGH (ref 11.5–15.5)
WBC: 8.2 10*3/uL (ref 4.0–10.5)

## 2016-07-05 LAB — BASIC METABOLIC PANEL
Anion gap: 7 (ref 5–15)
BUN: 11 mg/dL (ref 6–20)
CHLORIDE: 109 mmol/L (ref 101–111)
CO2: 26 mmol/L (ref 22–32)
CREATININE: 0.74 mg/dL (ref 0.44–1.00)
Calcium: 8.3 mg/dL — ABNORMAL LOW (ref 8.9–10.3)
GFR calc non Af Amer: >60 mL/min (ref >60)
GLUCOSE: 90 mg/dL (ref 65–99)
Potassium: 3.4 mmol/L — ABNORMAL LOW (ref 3.5–5.1)
Sodium: 142 mmol/L (ref 135–145)

## 2016-07-05 LAB — CREATININE, SERUM
CREATININE: 0.67 mg/dL (ref 0.44–1.00)
GFR calc Af Amer: >60 mL/min (ref >60)
GFR calc non Af Amer: >60 mL/min (ref >60)

## 2016-07-05 MED ORDER — CEPHALEXIN 500 MG PO CAPS
500.0000 mg | ORAL_CAPSULE | Freq: Four times a day (QID) | ORAL | Status: DC
  Administered 2016-07-05 – 2016-07-06 (×4): 500 mg via ORAL
  Filled 2016-07-05 (×9): qty 1

## 2016-07-05 MED ORDER — FERROUS GLUCONATE 324 (38 FE) MG PO TABS
324.0000 mg | ORAL_TABLET | Freq: Two times a day (BID) | ORAL | Status: DC
  Administered 2016-07-05: 324 mg via ORAL
  Filled 2016-07-05 (×4): qty 1

## 2016-07-05 MED ORDER — DIPHENHYDRAMINE HCL 25 MG PO CAPS
25.0000 mg | ORAL_CAPSULE | Freq: Every evening | ORAL | Status: DC | PRN
  Administered 2016-07-06: 25 mg via ORAL
  Filled 2016-07-05: qty 1

## 2016-07-05 MED ORDER — POLYETHYLENE GLYCOL 3350 17 G PO PACK: 17.0000 g | PACK | Freq: Every day | ORAL | Status: DC

## 2016-07-05 MED ORDER — SIMETHICONE 80 MG PO CHEW
80.0000 mg | CHEWABLE_TABLET | Freq: Two times a day (BID) | ORAL | Status: DC
  Administered 2016-07-05: 80 mg via ORAL
  Filled 2016-07-05: qty 1

## 2016-07-05 NOTE — Progress Notes (Signed)
Postpartum Note  D/w Dr. Luciana Axeomer of ID and he recommends transitioning to keflex 500 qid x 12 days. Pt doesn't have to stay inpatient until BCx sensitivities come back. Will keep today and possible d/c to home tomorrow.  Jacqueline Clarke, Jr MD Attending Center for [facility] 07/05/2016 Time: 1039am

## 2016-07-05 NOTE — Progress Notes (Signed)
Subjective: Patient reports feeling better. Her back pain and incisional pain are slowly improving.  She is tolerating a regular diet  Objective: I have reviewed patient's vital signs and labs.  General: alert, cooperative and no distress Resp: clear to auscultation bilaterally Cardio: regular rate and rhythm, S1, S2 normal, no murmur, click, rub or gallop GI: soft, non-tender; bowel sounds normal; no masses,  no organomegaly and incision: clean, dry, intact and healing well Extremities: extremities normal, atraumatic, no cyanosis or edema   Assessment/Plan: 31 yo POD#12 s/p c-section of twin B readmitted now with urosepsis - Patient afebrile for over 24 hours - Continue IV rocephin - Continue current care   LOS: 2 days    Maalik Pinn 07/05/2016, 6:38 AM

## 2016-07-06 DIAGNOSIS — Z8719 Personal history of other diseases of the digestive system: Secondary | ICD-10-CM

## 2016-07-06 HISTORY — DX: Personal history of other diseases of the digestive system: Z87.19

## 2016-07-06 LAB — CBC WITH DIFFERENTIAL/PLATELET
BASOS ABS: 0 10*3/uL (ref 0.0–0.1)
BASOS PCT: 0 %
EOS PCT: 3 %
Eosinophils Absolute: 0.2 10*3/uL (ref 0.0–0.7)
HCT: 25.7 % — ABNORMAL LOW (ref 36.0–46.0)
Hemoglobin: 8 g/dL — ABNORMAL LOW (ref 12.0–15.0)
LYMPHS PCT: 30 %
Lymphs Abs: 2.1 10*3/uL (ref 0.7–4.0)
MCH: 25.5 pg — ABNORMAL LOW (ref 26.0–34.0)
MCHC: 31.1 g/dL (ref 30.0–36.0)
MCV: 81.8 fL (ref 78.0–100.0)
MONO ABS: 0.5 10*3/uL (ref 0.1–1.0)
MONOS PCT: 7 %
Neutro Abs: 4.2 10*3/uL (ref 1.7–7.7)
Neutrophils Relative %: 60 %
PLATELETS: 209 10*3/uL (ref 150–400)
RBC: 3.14 MIL/uL — ABNORMAL LOW (ref 3.87–5.11)
RDW: 17.8 % — AB (ref 11.5–15.5)
WBC: 6.9 10*3/uL (ref 4.0–10.5)

## 2016-07-06 LAB — CULTURE, BLOOD (ROUTINE X 2)
SPECIAL REQUESTS: ADEQUATE
Special Requests: ADEQUATE

## 2016-07-06 LAB — CREATININE, SERUM: Creatinine, Ser: 0.84 mg/dL (ref 0.44–1.00)

## 2016-07-06 MED ORDER — SIMETHICONE 80 MG PO CHEW: 80.0000 mg | CHEWABLE_TABLET | Freq: Two times a day (BID) | ORAL | 0 refills | Status: DC

## 2016-07-06 MED ORDER — IBUPROFEN 600 MG PO TABS: 600.0000 mg | ORAL_TABLET | Freq: Four times a day (QID) | ORAL | 0 refills | Status: DC | PRN

## 2016-07-06 MED ORDER — DOCUSATE SODIUM 100 MG PO CAPS: 100.0000 mg | ORAL_CAPSULE | Freq: Two times a day (BID) | ORAL | 0 refills | Status: DC | PRN

## 2016-07-06 MED ORDER — CEPHALEXIN 500 MG PO CAPS: 500.0000 mg | ORAL_CAPSULE | Freq: Four times a day (QID) | ORAL | 0 refills | Status: DC

## 2016-07-06 MED ORDER — HYDROCODONE-ACETAMINOPHEN 5-325 MG PO TABS: 1.0000 | ORAL_TABLET | Freq: Four times a day (QID) | ORAL | 0 refills | Status: DC | PRN

## 2016-07-06 NOTE — Discharge Instructions (Signed)
Call the office on Monday or Tuesday to reschedule your postpartum appointment for later this week or early next week.    Pyelonephritis, Adult Pyelonephritis is a kidney infection. The kidneys are organs that help clean your blood by moving waste out of your blood and into your pee (urine). This infection can happen quickly, or it can last for a long time. In most cases, it clears up with treatment and does not cause other problems. Follow these instructions at home: Medicines  Take over-the-counter and prescription medicines only as told by your doctor.  Take your antibiotic medicine as told by your doctor. Do not stop taking the medicine even if you start to feel better. General instructions  Drink enough fluid to keep your pee clear or pale yellow.  Avoid caffeine, tea, and carbonated drinks.  Pee (urinate) often. Avoid holding in pee for long periods of time.  Pee before and after sex.  After pooping (having a bowel movement), women should wipe from front to back. Use each tissue only once.  Keep all follow-up visits as told by your doctor. This is important. Contact a doctor if:  You do not feel better after 2 days.  Your symptoms get worse.  You have a fever. Get help right away if:  You cannot take your medicine or drink fluids as told.  You have chills and shaking.  You throw up (vomit).  You have very bad pain in your side (flank) or back.  You feel very weak or you pass out (faint). This information is not intended to replace advice given to you by your health care provider. Make sure you discuss any questions you have with your health care provider. Document Released: 02/14/2004 Document Revised: 06/14/2015 Document Reviewed: 05/01/2014 Elsevier Interactive Patient Education  [employer]2018 Elsevier Inc.

## 2016-07-06 NOTE — Discharge Summary (Addendum)
Discharge Summary   Admit Date: 07/03/2016 Discharge Date: 07/06/2016 Discharging Service: Obstetrics  Primary OBGYN: [facility] Admitting Physician: Adam Phenix, MD  Discharge Physician: Vergie Living  Referring Provider: MAU  Primary Care Provider: [facility]  Admission Diagnoses: *Postpartum Day #9 s/p SVD *Postop Day#9 s/p urgent pLTCS of twin B *Urosepsis *BMI 40s *h/o Gestational HTN *Asymptomatic anemia  Discharge Diagnoses: *Resolved pyelonephritis  Consult Orders: None  Surgeries/Procedures Performed: None  History and Physical: Jacqueline Clarke is an 31 y.o. female. [id number] 10 days postpartum after vaginal/cesarean delivery of twins at 35 weeks. She presented to [facility] with increasing back pain and chills, found to have fever to 103 and WBC 15K. Urinalysis had many WBC and RBC.   Pertinent Gynecological History:  Bleeding: lochia Contraception: none DES exposure: denies Blood transfusions: none Sexually transmitted diseases: no past history Last pap: normal Date: 12/2015 OB History: [id number]   Menstrual History:  No LMP recorded.      Past Medical History:  Diagnosis Date  . Anemia   . Anxiety   . Asthma   . Blood type, Rh negative   . Cholelithiasis   . Chronic back pain    scoliosis- on percocet  . Depression   . Headache(784.0)   . Hyperemesis complicating pregnancy, antepartum 2013  . 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 Obesity   . Pregnancy induced hypertension   . Scoliosis          Past Surgical History:  Procedure Laterality Date  . arm surgery    . CESAREAN SECTION N/A 06/23/2016   Procedure: CESAREAN SECTION;  Surgeon: [employer] Bing, MD;  Location: [facility] BIRTHING SUITES;  Service: Obstetrics;  Laterality: N/A;  . CHOLECYSTECTOMY    . DILATION AND CURETTAGE OF UTERUS  2008  . MR WRIST RIGHT     sx to nerves  . NERVE, TENDON AND ARTERY REPAIR  Right 06/18/2014   Procedure: NERVE, TENDON AND ARTERY REPAIR;  Surgeon: Bradly Bienenstock, MD;  Location: [facility] OR;  Service: Orthopedics;  Laterality: Right;  . TONSILLECTOMY AND ADENOIDECTOMY    . VAGINAL DELIVERY  06/23/2016   Procedure: VAGINAL DELIVERY;  Surgeon: [place] Bing, MD;  Location: [facility] BIRTHING SUITES;  Service: Obstetrics;;         Family History  Problem Relation Age of Onset  . Heart disease Maternal Grandfather   . Diabetes Maternal Grandfather   . Heart disease Father   . Anesthesia problems Neg Hx     Social History:  reports that she quit smoking about 6 months ago. Her smoking use included Cigarettes. She smoked 0.50 packs per day. She has never used smokeless tobacco. She reports that she does not drink alcohol or use drugs.  Allergies: No Known Allergies         Prescriptions Prior to Admission  Medication Sig Dispense Refill Last Dose  . acetaminophen (TYLENOL) 325 MG tablet Take 650 mg by mouth every 6 (six) hours as needed for moderate pain.   07/03/2016 at Unknown time  . clotrimazole (GYNE-LOTRIMIN) 1 % vaginal cream Place 1 Applicatorful vaginally at bedtime. 45 g 0 Past Week at Unknown time  . cyclobenzaprine (FLEXERIL) 10 MG tablet Take 1 tablet by mouth 3 (three) times daily as needed for muscle spasms.   3 07/03/2016 at Unknown time  . docusate sodium (COLACE) 100 MG capsule Take 1 capsule (100 mg total) by mouth 2 (two) times daily as needed. 30 capsule  2 07/03/2016 at Unknown time  . ferrous gluconate (FERGON) 324 MG tablet Take 1 tablet (324 mg total) by mouth 2 (two) times daily with a meal. 80 tablet 0 07/02/2016 at Unknown time  . ibuprofen (ADVIL,MOTRIN) 600 MG tablet Take 1 tablet (600 mg total) by mouth every 6 (six) hours as needed. 30 tablet 0 07/03/2016 at Unknown time  . sertraline (ZOLOFT) 50 MG tablet Take 1 tablet (50 mg total) by mouth daily. Take 1/2 tablet qd x 7 days and then increase to 1 whole tablet qd (Patient taking  differently: Take 50 mg by mouth at bedtime. ) 30 tablet 2 07/02/2016 at Unknown time  . docusate sodium (COLACE) 100 MG capsule Take 1 capsule (100 mg total) by mouth 2 (two) times daily. (Patient not taking: Reported on 07/03/2016) 10 capsule 0 Completed Course at Unknown time  . HYDROmorphone (DILAUDID) 2 MG tablet Take 1 tablet (2 mg total) by mouth every 4 (four) hours as needed for moderate pain or severe pain. (Patient not taking: Reported on 07/03/2016) 30 tablet 0 Completed Course at Unknown time  . polyethylene glycol (MIRALAX / GLYCOLAX) packet Take 17 g by mouth daily. (Patient not taking: Reported on 07/03/2016) 30 packet 3 Completed Course at Unknown time  . Prenatal Vit-Fe Fumarate-FA (PRENATAL MULTIVITAMIN) TABS tablet Take 1 tablet by mouth daily at 12 noon. (Patient not taking: Reported on 07/03/2016) 30 tablet 6 Completed Course at Unknown time    ROS  Blood pressure 131/85, pulse (!) 141, temperature (!) 102.9 F (39.4 C), temperature source Oral, resp. rate 20, height 5\' 11"  (1.803 m), weight (!) 319 lb (144.7 kg), SpO2 98 %, not currently breastfeeding. Physical Exam  Vitals reviewed. Constitutional: She is oriented to person, place, and time. She appears well-developed. No distress.  Respiratory: Effort normal. No respiratory distress.  GI: Soft. She exhibits no mass. There is tenderness (minimal). There is no rebound and no guarding.  Mild to mod RCVAT  Musculoskeletal: Normal range of motion.  Neurological: She is alert and oriented to person, place, and time.  Skin: Skin is warm.    LabResultsLast24Hours       Results for orders placed or performed during the hospital encounter of 07/03/16 (from the past 24 hour(s))  Comprehensive metabolic panel     Status: Abnormal   Collection Time: 07/03/16  2:55 PM  Result Value Ref Range   Sodium 144 135 - 145 mmol/L   Potassium 3.4 (L) 3.5 - 5.1 mmol/L   Chloride 108 101 - 111 mmol/L   CO2 24 22 - 32 mmol/L    Glucose, Bld 104 (H) 65 - 99 mg/dL   BUN 6 6 - 20 mg/dL   Creatinine, Ser 1.610.95 0.44 - 1.00 mg/dL   Calcium 8.5 (L) 8.9 - 10.3 mg/dL   Total Protein 6.6 6.5 - 8.1 g/dL   Albumin 2.7 (L) 3.5 - 5.0 g/dL   AST 17 15 - 41 U/L   ALT 13 (L) 14 - 54 U/L   Alkaline Phosphatase 88 38 - 126 U/L   Total Bilirubin 0.7 0.3 - 1.2 mg/dL   GFR calc non Af Amer >60 >60 mL/min   GFR calc Af Amer >60 >60 mL/min   Anion gap 12 5 - 15  CBC with Differential     Status: Abnormal   Collection Time: 07/03/16  2:55 PM  Result Value Ref Range   WBC 15.3 (H) 4.0 - 10.5 K/uL   RBC 3.54 (L) 3.87 - 5.11  MIL/uL   Hemoglobin 9.1 (L) 12.0 - 15.0 g/dL   HCT 16.1 (L) 09.6 - 04.5 %   MCV 81.4 78.0 - 100.0 fL   MCH 25.7 (L) 26.0 - 34.0 pg   MCHC 31.6 30.0 - 36.0 g/dL   RDW 40.9 (H) 81.1 - 91.4 %   Platelets 237 150 - 400 K/uL   Neutrophils Relative % 87 %   Neutro Abs 13.3 (H) 1.7 - 7.7 K/uL   Lymphocytes Relative 6 %   Lymphs Abs 0.9 0.7 - 4.0 K/uL   Monocytes Relative 7 %   Monocytes Absolute 1.1 (H) 0.1 - 1.0 K/uL   Eosinophils Relative 0 %   Eosinophils Absolute 0.0 0.0 - 0.7 K/uL   Basophils Relative 0 %   Basophils Absolute 0.0 0.0 - 0.1 K/uL  Protime-INR     Status: None   Collection Time: 07/03/16  2:55 PM  Result Value Ref Range   Prothrombin Time 15.0 11.4 - 15.2 seconds   INR 1.17   Urinalysis, Routine w reflex microscopic     Status: Abnormal   Collection Time: 07/03/16  2:55 PM  Result Value Ref Range   Color, Urine YELLOW YELLOW   APPearance CLOUDY (A) CLEAR   Specific Gravity, Urine 1.008 1.005 - 1.030   pH 8.0 5.0 - 8.0   Glucose, UA NEGATIVE NEGATIVE mg/dL   Hgb urine dipstick MODERATE (A) NEGATIVE   Bilirubin Urine NEGATIVE NEGATIVE   Ketones, ur 5 (A) NEGATIVE mg/dL   Protein, ur 30 (A) NEGATIVE mg/dL   Nitrite NEGATIVE NEGATIVE   Leukocytes, UA LARGE (A) NEGATIVE   RBC / HPF 6-30 0 - 5 RBC/hpf   WBC, UA TOO NUMEROUS TO COUNT 0 - 5  WBC/hpf   Bacteria, UA RARE (A) NONE SEEN   Squamous Epithelial / LPF 0-5 (A) NONE SEEN   WBC Clumps PRESENT    Mucous PRESENT   Lipase, blood     Status: None   Collection Time: 07/03/16  2:55 PM  Result Value Ref Range   Lipase 25 11 - 51 U/L  I-Stat CG4 Lactic Acid, ED     Status: None   Collection Time: 07/03/16  3:06 PM  Result Value Ref Range   Lactic Acid, Venous 1.02 0.5 - 1.9 mmol/L  Acetaminophen level     Status: Abnormal   Collection Time: 07/03/16  3:29 PM  Result Value Ref Range   Acetaminophen (Tylenol), Serum <10 (L) 10 - 30 ug/mL  I-Stat CG4 Lactic Acid, ED     Status: None   Collection Time: 07/03/16  6:16 PM  Result Value Ref Range   Lactic Acid, Venous 1.36 0.5 - 1.9 mmol/L       ImagingResults(Last48hours)  Dg Chest 2 View  Result Date: 07/03/2016 CLINICAL DATA:  Fevers and chills EXAM: CHEST  2 VIEW COMPARISON:  06/27/2016 FINDINGS: Cardiac shadow remains mildly enlarged. The lungs are well aerated bilaterally. No focal infiltrate or sizable effusion is seen. No bony abnormality is noted. IMPRESSION: No acute abnormality noted. Electronically Signed   By: Alcide Clever M.D.   On: 07/03/2016 15:58   Ct Abdomen Pelvis W Contrast  Result Date: 07/03/2016 CLINICAL DATA:  Right flank pain and fever since last night. Recently discharged from [facility] following postoperative complications from a C-section on 06/23/2016. EXAM: CT ABDOMEN AND PELVIS WITH CONTRAST TECHNIQUE: Multidetector CT imaging of the abdomen and pelvis was performed using the standard protocol following bolus administration of intravenous contrast. CONTRAST:  ISOVUE-300 IOPAMIDOL (ISOVUE-300) INJECTION 61% COMPARISON:  Abdomen and pelvis CT dated 11/09/2012. Chest and abdomen radiographs dated 06/27/2016. FINDINGS: Lower chest: Minimal left basilar atelectasis. Hepatobiliary: Cholecystectomy clips.  Normal appearing liver. Pancreas: Unremarkable. No pancreatic  ductal dilatation or surrounding inflammatory changes. Spleen: Normal in size.  Minimally heterogeneous. Adrenals/Urinary Tract: Adrenal glands are unremarkable. Kidneys are normal, without renal calculi, focal lesion, or hydronephrosis. Bladder is unremarkable.Normal appearing adrenal glands. 4 mm lower pole right renal calculus. Normal appearing urinary bladder, ureters and left kidney. No hydronephrosis. Stomach/Bowel: Stomach is within normal limits. Appendix appears normal. No evidence of bowel wall thickening, distention, or inflammatory changes. Vascular/Lymphatic: No significant vascular findings are present. No enlarged abdominal or pelvic lymph nodes. Reproductive: Diffusely enlarged uterus with mildly heterogeneous central low density and mild heterogeneity at the location of the C-section incision in the anterior aspect of the lower uterine segment. Other: Bilateral subcutaneous edema. Small amount of air in the subcutaneous fat on the left near the incision site. No fluid collections seen. Musculoskeletal: Mild lower thoracic spine degenerative changes. IMPRESSION: 1. 4 mm nonobstructing lower pole right renal calculus. 2. Enlarged uterus with mildly heterogeneous central low density and mild heterogeneity in the lower uterine segment incision site. This could represent normal postpartum and post surgical changes. Infection cannot be excluded. 3. Mild bilateral subcutaneous edema. Electronically Signed   By: Beckie Salts M.D.   On: 07/03/2016 17:40     Assessment/Plan: Postoperative day 10 after CS Fever, WBC elevated and pyuria and tenderness most suspicious for pyeonephritis. Will continue present antibiotic and evaluate in the morning. Scheryl Darter 07/03/2016, 8:36 PM    Electronically signed by Adam Phenix, MD at 07/03/2016 8:48 PM      Hospital Course: *Postpartum/post op: no issues. Meeting all goals stills *ID: BCx x 1 came back + for e. Coli and UCx came back + for >100k c.  Coli. She was on vanc and zosyn x 2d initially and then transitioned to rocephin, which she was on for two days before being transitioned to PO keflex, after d/w ID. She was afebrile >48hrs prior to discharge and was meeting all goals. It was sensitive to ancef to patient sent home on keflex course  CBC Latest Ref Rng & Units 07/06/2016 07/05/2016 07/04/2016  WBC 4.0 - 10.5 K/uL 6.9 8.2 13.8(H)  Hemoglobin 12.0 - 15.0 g/dL 8.0(L) 8.0(L) 8.2(L)  Hematocrit 36.0 - 46.0 % 25.7(L) 25.5(L) 25.8(L)  Platelets 150 - 400 K/uL 209 192 195   CMP Latest Ref Rng & Units 07/06/2016 07/05/2016 07/05/2016  Glucose 65 - 99 mg/dL - 90 -  BUN 6 - 20 mg/dL - 11 -  Creatinine 4.54 - 1.00 mg/dL 0.98 1.19 1.47  Sodium 135 - 145 mmol/L - 142 -  Potassium 3.5 - 5.1 mmol/L - 3.4(L) -  Chloride 101 - 111 mmol/L - 109 -  CO2 22 - 32 mmol/L - 26 -  Calcium 8.9 - 10.3 mg/dL - 8.3(L) -  Total Protein 6.5 - 8.1 g/dL - - -  Total Bilirubin 0.3 - 1.2 mg/dL - - -  Alkaline Phos 38 - 126 U/L - - -  AST 15 - 41 U/L - - -  ALT 14 - 54 U/L - - -  .   Discharge Exam:   Current Vital Signs 24h Vital Sign Ranges  T 98 F (36.7 C) Temp  Avg: 98.5 F (36.9 C)  Min: 98 F (36.7 C)  Max: 98.8 F (37.1 C)  BP 125/85 BP  Min: 125/85  Max: 146/95  HR 74 Pulse  Avg: 75.4  Min: 65  Max: 83  RR 16 Resp  Avg: 17  Min: 15  Max: 18  SaO2 98 % Not Delivered SpO2  Avg: 98.2 %  Min: 96 %  Max: 100 %       24 Hour I/O Current Shift I/O  Time Ins Outs No intake/output data recorded. No intake/output data recorded.    Patient Vitals for the past 24 hrs:  BP Temp Temp src Pulse Resp SpO2  07/06/16 0514 125/85 98 F (36.7 C) Oral 74 16 98 %  07/06/16 0030 135/87 98.8 F (37.1 C) Oral 75 15 98 %  07/05/16 1608 (!) 146/95 98.5 F (36.9 C) Oral 65 18 100 %  07/05/16 1235 (!) 129/98 98.5 F (36.9 C) Oral 83 18 99 %  07/05/16 0830 136/86 98.5 F (36.9 C) Oral 80 18 96 %    General appearance: Well nourished, well developed female in  no acute distress.  Neck:  Supple, normal appearance, and no thyromegaly  Cardiovascular: S1, S2 normal, no murmur, rub or gallop, regular rate and rhythm Respiratory:  Clear to auscultation bilateral. Normal respiratory effort Abdomen: positive bowel sounds and no masses, hernias; diffusely non tender to palpation, non distended. Obese, well healed incision with steri strips in place back: no CVAT Skin:  Warm and dry.   Discharge Disposition:  Home  Patient Instructions:  standard   Results Pending at Discharge:  none  Discharge Medications: Allergies as of 07/06/2016   No Known Allergies     Medication List    TAKE these medications   acetaminophen 325 MG tablet Commonly known as:  TYLENOL Take 650 mg by mouth every 6 (six) hours as needed for moderate pain.   cephALEXin 500 MG capsule Commonly known as:  KEFLEX Take 1 capsule (500 mg total) by mouth every 6 (six) hours.   clotrimazole 1 % vaginal cream Commonly known as:  GYNE-LOTRIMIN Place 1 Applicatorful vaginally at bedtime.   cyclobenzaprine 10 MG tablet Commonly known as:  FLEXERIL Take 1 tablet by mouth 3 (three) times daily as needed for muscle spasms.   docusate sodium 100 MG capsule Commonly known as:  COLACE Take 1 capsule (100 mg total) by mouth 2 (two) times daily as needed. What changed:  Another medication with the same name was changed. Make sure you understand how and when to take each.   docusate sodium 100 MG capsule Commonly known as:  COLACE Take 1 capsule (100 mg total) by mouth 2 (two) times daily as needed for mild constipation. What changed:  when to take this  reasons to take this   ferrous gluconate 324 MG tablet Commonly known as:  FERGON Take 1 tablet (324 mg total) by mouth 2 (two) times daily with a meal.   HYDROcodone-acetaminophen 5-325 MG tablet Commonly known as:  NORCO/VICODIN Take 1 tablet by mouth every 6 (six) hours as needed for moderate pain.   HYDROmorphone 2  MG tablet Commonly known as:  DILAUDID Take 1 tablet (2 mg total) by mouth every 4 (four) hours as needed for moderate pain or severe pain.   ibuprofen 600 MG tablet Commonly known as:  ADVIL,MOTRIN Take 1 tablet (600 mg total) by mouth every 6 (six) hours as needed.   polyethylene glycol packet Commonly known as:  MIRALAX / GLYCOLAX Take 17 g by mouth daily.   prenatal multivitamin Tabs tablet Take 1 tablet  by mouth daily at 12 noon.   sertraline 50 MG tablet Commonly known as:  ZOLOFT Take 1 tablet (50 mg total) by mouth daily. Take 1/2 tablet qd x 7 days and then increase to 1 whole tablet qd What changed:  when to take this  additional instructions   simethicone 80 MG chewable tablet Commonly known as:  MYLICON Chew 1 tablet (80 mg total) by mouth 2 (two) times daily before a meal.        RTC: request sent for appt in 7-10d at [facility]. MD Attending Center for [facility])

## 2016-07-09 ENCOUNTER — Ambulatory Visit (INDEPENDENT_AMBULATORY_CARE_PROVIDER_SITE_OTHER): Payer: Medicaid Other | Admitting: Obstetrics

## 2016-07-09 ENCOUNTER — Encounter: Payer: Self-pay | Admitting: Obstetrics

## 2016-07-09 DIAGNOSIS — Z6841 Body Mass Index (BMI) 40.0 and over, adult: Secondary | ICD-10-CM

## 2016-07-09 DIAGNOSIS — F5102 Adjustment insomnia: Secondary | ICD-10-CM

## 2016-07-09 DIAGNOSIS — I10 Essential (primary) hypertension: Secondary | ICD-10-CM

## 2016-07-09 DIAGNOSIS — F53 Postpartum depression: Secondary | ICD-10-CM | POA: Insufficient documentation

## 2016-07-09 DIAGNOSIS — O99345 Other mental disorders complicating the puerperium: Secondary | ICD-10-CM

## 2016-07-09 DIAGNOSIS — F5101 Primary insomnia: Secondary | ICD-10-CM | POA: Insufficient documentation

## 2016-07-09 MED ORDER — CARVEDILOL 12.5 MG PO TABS: 12.5000 mg | ORAL_TABLET | Freq: Two times a day (BID) | ORAL | 0 refills | Status: DC

## 2016-07-09 MED ORDER — TRIAMTERENE-HCTZ 37.5-25 MG PO CAPS: 1.0000 | ORAL_CAPSULE | Freq: Every day | ORAL | 0 refills | Status: DC

## 2016-07-09 MED ORDER — ZOLPIDEM TARTRATE 5 MG PO TABS: 5.0000 mg | ORAL_TABLET | Freq: Every evening | ORAL | 2 refills | Status: DC | PRN

## 2016-07-09 MED ORDER — AMLODIPINE BESYLATE 5 MG PO TABS: 5.0000 mg | ORAL_TABLET | Freq: Every day | ORAL | 0 refills | Status: DC

## 2016-07-09 NOTE — Progress Notes (Addendum)
Subjective:     Jacqueline Clarke is a 31 y.o. female who presents for a postpartum visit. She is 2 weeks postpartum following a twin delivery by NSVD of Twin A, and a  low cervical transverse Cesarean section of Twin B. I have fully reviewed the prenatal and intrapartum course. The delivery was at 35 gestational weeks. Outcome: spontaneous vaginal delivery and primary cesarean section, low transverse incision. Anesthesia: epidural. Postpartum course has been complicated by depression. Babies' course has been in the NICU for 2 weeks due to pematurity. Babies are feeding by bottle - Similac Neosure. Bleeding thin lochia. Bowel function is normal. Bladder function is normal. Patient is not sexually active. Contraception method is abstinence. Postpartum depression screening: positive.  Tobacco, alcohol and substance abuse history reviewed.  Adult immunizations reviewed including TDAP, rubella and varicella.  The following portions of the patient's history were reviewed and updated as appropriate: allergies, current medications, past family history, past medical history, past social history, past surgical history and problem list.  Review of Systems A comprehensive review of systems was negative except for: Behavioral/Psych: positive for depression   Objective:    BP (!) 140/101   Pulse 77   Wt 287 lb (130.2 kg)   Breastfeeding? No   BMI 40.03 kg/m   General:  alert and no distress   Breasts:  inspection negative, no nipple discharge or bleeding, no masses or nodularity palpable  Lungs: clear to auscultation bilaterally  Heart:  regular rate and rhythm, S1, S2 normal, no murmur, click, rub or gallop  Abdomen: normal findings: soft, non-tender and incision C, D, I.   Vulva:  not evaluated  Vagina: not evaluated  Cervix:  not evaluated  Corpus: not examined  Adnexa:  not evaluated  Rectal Exam: Not performed.          50% of 20 min visit spent on counseling and coordination of care.     Assessment:     1. Postpartum care following vaginal delivery - Twin A   2. Postpartum care following cesarean delivery - Twin B  3. Depression, postpartum - continue Zoloft  4. Adjustment insomnia - zolpidem (AMBIEN) 5 MG tablet; Take 1 tablet (5 mg total) by mouth at bedtime as needed for sleep.  Dispense: 15 tablet; Refill: 2  5. Class 3 severe obesity due to excess calories without serious comorbidity with body mass index (BMI) of 40.0 to 44.9 in adult [facility](HCC) - recommend weight loss program, including diet and exercise  6. HTN (hypertension), benign Rx: - amLODipine (NORVASC) 5 MG tablet; Take 1 tablet (5 mg total) by mouth daily.  Dispense: 30 tablet; Refill: 0 - carvedilol (COREG) 12.5 MG tablet; Take 1 tablet (12.5 mg total) by mouth 2 (two) times daily with a meal.  Dispense: 30 tablet; Refill: 0 - triamterene-hydrochlorothiazide (DYAZIDE) 37.5-25 MG capsule; Take 1 each (1 capsule total) by mouth daily.  Dispense: 60 capsule; Refill: 0  Plan:    1. Contraception: abstinence 2. Zoloft Rx 3. Norvasc / Coreg / Dyazide Rx for HTN 4.. Follow up in: 1 week or as needed.

## 2016-07-09 NOTE — Progress Notes (Signed)
Patient is in the office to have her babies circumcised. She missed her incision check and needs to have follow up to her BP. She is 2 weeks out from her delivery.

## 2016-07-10 ENCOUNTER — Other Ambulatory Visit: Payer: Self-pay | Admitting: Certified Nurse Midwife

## 2016-07-10 DIAGNOSIS — F53 Postpartum depression: Secondary | ICD-10-CM

## 2016-07-10 DIAGNOSIS — O99345 Other mental disorders complicating the puerperium: Principal | ICD-10-CM

## 2016-07-16 ENCOUNTER — Ambulatory Visit (INDEPENDENT_AMBULATORY_CARE_PROVIDER_SITE_OTHER): Payer: Medicaid Other | Admitting: Obstetrics

## 2016-07-16 ENCOUNTER — Encounter: Payer: Self-pay | Admitting: Obstetrics

## 2016-07-16 DIAGNOSIS — F53 Postpartum depression: Secondary | ICD-10-CM

## 2016-07-16 DIAGNOSIS — O99345 Other mental disorders complicating the puerperium: Secondary | ICD-10-CM

## 2016-07-16 DIAGNOSIS — G8918 Other acute postprocedural pain: Secondary | ICD-10-CM

## 2016-07-16 DIAGNOSIS — B3731 Acute candidiasis of vulva and vagina: Secondary | ICD-10-CM

## 2016-07-16 DIAGNOSIS — B373 Candidiasis of vulva and vagina: Secondary | ICD-10-CM

## 2016-07-16 DIAGNOSIS — B369 Superficial mycosis, unspecified: Secondary | ICD-10-CM

## 2016-07-16 DIAGNOSIS — I10 Essential (primary) hypertension: Secondary | ICD-10-CM

## 2016-07-16 MED ORDER — OXYCODONE-ACETAMINOPHEN 10-325 MG PO TABS: 1.0000 | ORAL_TABLET | ORAL | 0 refills | Status: DC | PRN

## 2016-07-16 MED ORDER — IBUPROFEN 800 MG PO TABS: 800.0000 mg | ORAL_TABLET | Freq: Three times a day (TID) | ORAL | 5 refills | Status: DC | PRN

## 2016-07-16 MED ORDER — CLOTRIMAZOLE 1 % EX CREA: 1.0000 "application " | TOPICAL_CREAM | Freq: Two times a day (BID) | CUTANEOUS | 1 refills | Status: DC

## 2016-07-16 MED ORDER — FLUCONAZOLE 200 MG PO TABS: 200.0000 mg | ORAL_TABLET | ORAL | 1 refills | Status: DC

## 2016-07-16 NOTE — Progress Notes (Signed)
Subjective:     Jacqueline Clarke is a 31 y.o. female who presents for a postpartum visit. She is 3 weeks postpartum twin deliveries:  with a NSVD of Twin A,  followed by [facility] for 2nd Twin. I have fully reviewed the prenatal and intrapartum course. The delivery was at 35.2 gestational weeks. Outcome: spontaneous vaginal delivery and repeat cesarean section, low transverse incision. Anesthesia: epidural. Postpartum course has been complicated by hypertension. Babies' course has been normal. Babies are feeding by bottle - Similac Advance. Bleeding thin lochia. Bowel function is normal. Bladder function is normal. Patient is not sexually active. Contraception method is abstinence. Postpartum depression screening: positive.  Tobacco, alcohol and substance abuse history reviewed.  Adult immunizations reviewed including TDAP, rubella and varicella.  The following portions of the patient's history were reviewed and updated as appropriate: allergies, current medications, past family history, past medical history, past social history, past surgical history and problem list.  Review of Systems A comprehensive review of systems was negative except for: Cardiovascular: positive for hypertension Behavioral/Psych: positive for depression   Objective:    BP 134/88   Pulse 89   Wt 276 lb (125.2 kg)   BMI 38.49 kg/m    PE:  Deferred   50% of 10 min visit spent on counseling and coordination of care.    Assessment:      1. Postpartum care following vaginal delivery   2. Postpartum care following cesarean delivery   3. Postoperative pain Rx: - oxyCODONE-acetaminophen (PERCOCET) 10-325 MG tablet; Take 1 tablet by mouth every 4 (four) hours as needed for pain.  Dispense: 30 tablet; Refill: 0 - ibuprofen (ADVIL,MOTRIN) 800 MG tablet; Take 1 tablet (800 mg total) by mouth every 8 (eight) hours as needed.  Dispense: 30 tablet; Refill: 5  4. Candida vaginitis Rx: - fluconazole (DIFLUCAN) 200 MG  tablet; Take 1 tablet (200 mg total) by mouth every other day.  Dispense: 3 tablet; Refill: 1  5. Superficial fungus infection of skin Rx: - clotrimazole (LOTRIMIN) 1 % cream; Apply 1 application topically 2 (two) times daily.  Dispense: 113 g; Refill: 1  6. Contraceptive Counseling and Advice - wants Mirena IUD  Postpartum Hypertension.  Stable - continue antihypertensives for another 3 weeks, then will reevaluate BP   7. Depression.  Stable. -continue Zoloft   Plan:    1. Contraception: IUD 2. Mirena IUD Rx 3. Follow up in: 3 weeks or as needed.

## 2016-07-24 ENCOUNTER — Ambulatory Visit: Payer: Medicaid Other | Admitting: Obstetrics & Gynecology

## 2016-07-29 ENCOUNTER — Encounter: Payer: Self-pay | Admitting: Family Medicine

## 2016-08-14 ENCOUNTER — Institutional Professional Consult (permissible substitution): Payer: Medicaid Other

## 2016-08-14 ENCOUNTER — Ambulatory Visit: Payer: Medicaid Other | Admitting: Obstetrics

## 2016-08-14 NOTE — BH Specialist Note (Deleted)
Integrated Behavioral Health Initial Visit  MRN: [id number] Name: Jacqueline Clarke   Session Start time: *** Session End time: *** Total time: {IBH Total Time:21014050}  Type of Service: Integrated Behavioral Health- Individual/Family Interpretor:No. Interpretor Name and Language: n/a   Warm Hand Off Completed.       SUBJECTIVE: Jacqueline Clarke is a 31 y.o. female accompanied by {Persons; PED relatives w/patient:19415}. Patient was referred by Dr Clearance CootsHarper for postpartum depression. Patient reports the following symptoms/concerns: *** Duration of problem: ***; Severity of problem: {Mild/Moderate/Severe:20260}  OBJECTIVE: Mood: {BHH MOOD:22306} and Affect: {BHH AFFECT:22307} Risk of harm to self or others: {CHL AMB BH Suicide Current Mental Status:21022748}   LIFE CONTEXT: Family and Social: *** School/Work: *** Self-Care: *** Life Changes: Recent childbirth(twins)  GOALS ADDRESSED: Patient will reduce symptoms of: {IBH Symptoms:21014056} and increase knowledge and/or ability of: {IBH Patient Tools:21014057} and also: {IBH Goals:21014053}   INTERVENTIONS: {IBH Interventions:21014054}  Standardized Assessments completed: GAD-7 and PHQ 9  ASSESSMENT: Patient currently experiencing ***. Patient may benefit from psychoeducation and brief therapeutic interventions regarding coping with symptoms of ***.  PLAN: 1. Follow up with behavioral health clinician on : *** 2. Behavioral recommendations:  -*** -*** -*** 3. Referral(s): {IBH Referrals:21014055} 4. "From scale of 1-10, how likely are you to follow plan?": ***  Cate Oravec C Hilman Kissling, LCSWA

## 2016-10-13 ENCOUNTER — Emergency Department (HOSPITAL_COMMUNITY): Payer: No Typology Code available for payment source

## 2016-10-13 ENCOUNTER — Encounter (HOSPITAL_COMMUNITY): Payer: Self-pay

## 2016-10-13 ENCOUNTER — Emergency Department (HOSPITAL_COMMUNITY)
Admission: EM | Admit: 2016-10-13 | Discharge: 2016-10-13 | Disposition: A | Discharge status: Home / Self Care | Payer: No Typology Code available for payment source | Attending: Emergency Medicine | Admitting: Emergency Medicine

## 2016-10-13 DIAGNOSIS — Z79899 Other long term (current) drug therapy: Secondary | ICD-10-CM | POA: Insufficient documentation

## 2016-10-13 DIAGNOSIS — Y9389 Activity, other specified: Secondary | ICD-10-CM | POA: Diagnosis not present

## 2016-10-13 DIAGNOSIS — S300XXA Contusion of lower back and pelvis, initial encounter: Secondary | ICD-10-CM | POA: Diagnosis not present

## 2016-10-13 DIAGNOSIS — Y999 Unspecified external cause status: Secondary | ICD-10-CM | POA: Insufficient documentation

## 2016-10-13 DIAGNOSIS — M549 Dorsalgia, unspecified: Secondary | ICD-10-CM

## 2016-10-13 DIAGNOSIS — F17210 Nicotine dependence, cigarettes, uncomplicated: Secondary | ICD-10-CM | POA: Insufficient documentation

## 2016-10-13 DIAGNOSIS — S161XXA Strain of muscle, fascia and tendon at neck level, initial encounter: Secondary | ICD-10-CM | POA: Insufficient documentation

## 2016-10-13 DIAGNOSIS — M542 Cervicalgia: Secondary | ICD-10-CM | POA: Diagnosis not present

## 2016-10-13 DIAGNOSIS — S3992XA Unspecified injury of lower back, initial encounter: Secondary | ICD-10-CM | POA: Diagnosis present

## 2016-10-13 DIAGNOSIS — R51 Headache: Secondary | ICD-10-CM | POA: Insufficient documentation

## 2016-10-13 DIAGNOSIS — Y92410 Unspecified street and highway as the place of occurrence of the external cause: Secondary | ICD-10-CM | POA: Diagnosis not present

## 2016-10-13 DIAGNOSIS — J45909 Unspecified asthma, uncomplicated: Secondary | ICD-10-CM | POA: Diagnosis not present

## 2016-10-13 DIAGNOSIS — T148XXA Other injury of unspecified body region, initial encounter: Secondary | ICD-10-CM

## 2016-10-13 LAB — COMPREHENSIVE METABOLIC PANEL
ALT: 15 U/L (ref 14–54)
AST: 20 U/L (ref 15–41)
Albumin: 3.8 g/dL (ref 3.5–5.0)
Alkaline Phosphatase: 80 U/L (ref 38–126)
Anion gap: 9 (ref 5–15)
BUN: 9 mg/dL (ref 6–20)
CO2: 24 mmol/L (ref 22–32)
CREATININE: 0.71 mg/dL (ref 0.44–1.00)
Calcium: 9.1 mg/dL (ref 8.9–10.3)
Chloride: 106 mmol/L (ref 101–111)
Glucose, Bld: 86 mg/dL (ref 65–99)
Potassium: 3.8 mmol/L (ref 3.5–5.1)
Sodium: 139 mmol/L (ref 135–145)
TOTAL PROTEIN: 7.6 g/dL (ref 6.5–8.1)
Total Bilirubin: 0.4 mg/dL (ref 0.3–1.2)

## 2016-10-13 LAB — I-STAT CHEM 8, ED
BUN: 8 mg/dL (ref 6–20)
CALCIUM ION: 1.15 mmol/L (ref 1.15–1.40)
CREATININE: 0.7 mg/dL (ref 0.44–1.00)
Chloride: 105 mmol/L (ref 101–111)
GLUCOSE: 83 mg/dL (ref 65–99)
HCT: 42 % (ref 36.0–46.0)
Hemoglobin: 14.3 g/dL (ref 12.0–15.0)
Potassium: 3.8 mmol/L (ref 3.5–5.1)
Sodium: 141 mmol/L (ref 135–145)
TCO2: 25 mmol/L (ref 22–32)

## 2016-10-13 LAB — CBC
HCT: 39.3 % (ref 36.0–46.0)
Hemoglobin: 12.4 g/dL (ref 12.0–15.0)
MCH: 25 pg — ABNORMAL LOW (ref 26.0–34.0)
MCHC: 31.6 g/dL (ref 30.0–36.0)
MCV: 79.2 fL (ref 78.0–100.0)
PLATELETS: 259 10*3/uL (ref 150–400)
RBC: 4.96 MIL/uL (ref 3.87–5.11)
RDW: 15.1 % (ref 11.5–15.5)
WBC: 8.3 10*3/uL (ref 4.0–10.5)

## 2016-10-13 LAB — I-STAT BETA HCG BLOOD, ED (MC, WL, AP ONLY): I-stat hCG, quantitative: <5 m[IU]/mL (ref <5)

## 2016-10-13 MED ORDER — MORPHINE SULFATE (PF) 4 MG/ML IV SOLN
4.0000 mg | Freq: Once | INTRAVENOUS | Status: AC
  Administered 2016-10-13: 4 mg via INTRAVENOUS
  Filled 2016-10-13: qty 1

## 2016-10-13 MED ORDER — IOPAMIDOL (ISOVUE-300) INJECTION 61%
100.0000 mL | Freq: Once | INTRAVENOUS | Status: AC | PRN
  Administered 2016-10-13: 100 mL via INTRAVENOUS

## 2016-10-13 MED ORDER — IOPAMIDOL (ISOVUE-300) INJECTION 61%
INTRAVENOUS | Status: AC
  Administered 2016-10-13: 100 mL via INTRAVENOUS
  Filled 2016-10-13: qty 100

## 2016-10-13 MED ORDER — SODIUM CHLORIDE 0.9 % IV BOLUS (SEPSIS)
1000.0000 mL | Freq: Once | INTRAVENOUS | Status: AC
  Administered 2016-10-13: 1000 mL via INTRAVENOUS
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
## 2016-10-13 NOTE — ED Notes (Signed)
Patient transported to CT 

## 2016-10-13 NOTE — ED Notes (Signed)
ED Provider at bedside. 
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
## 2016-10-13 NOTE — ED Notes (Signed)
Bed: WLPT1 Expected date:  Expected time:  Means of arrival:  Comments: 

## 2016-10-13 NOTE — ED Notes (Signed)
Bed: WLPT2 Expected date:  Expected time:  Means of arrival:  Comments: 

## 2016-11-09 ENCOUNTER — Encounter (HOSPITAL_COMMUNITY): Payer: Self-pay | Admitting: Emergency Medicine

## 2016-11-09 ENCOUNTER — Ambulatory Visit (HOSPITAL_COMMUNITY)
Admission: EM | Admit: 2016-11-09 | Discharge: 2016-11-09 | Disposition: A | Discharge status: Home / Self Care | Payer: Medicaid Other | Attending: Internal Medicine | Admitting: Internal Medicine

## 2016-11-09 DIAGNOSIS — R112 Nausea with vomiting, unspecified: Secondary | ICD-10-CM | POA: Insufficient documentation

## 2016-11-09 DIAGNOSIS — G8929 Other chronic pain: Secondary | ICD-10-CM | POA: Diagnosis not present

## 2016-11-09 DIAGNOSIS — E669 Obesity, unspecified: Secondary | ICD-10-CM | POA: Diagnosis not present

## 2016-11-09 DIAGNOSIS — J45909 Unspecified asthma, uncomplicated: Secondary | ICD-10-CM | POA: Insufficient documentation

## 2016-11-09 DIAGNOSIS — M549 Dorsalgia, unspecified: Secondary | ICD-10-CM | POA: Insufficient documentation

## 2016-11-09 DIAGNOSIS — M419 Scoliosis, unspecified: Secondary | ICD-10-CM | POA: Insufficient documentation

## 2016-11-09 DIAGNOSIS — R109 Unspecified abdominal pain: Secondary | ICD-10-CM | POA: Diagnosis not present

## 2016-11-09 DIAGNOSIS — Z9049 Acquired absence of other specified parts of digestive tract: Secondary | ICD-10-CM | POA: Diagnosis not present

## 2016-11-09 DIAGNOSIS — F5101 Primary insomnia: Secondary | ICD-10-CM | POA: Insufficient documentation

## 2016-11-09 DIAGNOSIS — R197 Diarrhea, unspecified: Secondary | ICD-10-CM | POA: Diagnosis present

## 2016-11-09 DIAGNOSIS — F329 Major depressive disorder, single episode, unspecified: Secondary | ICD-10-CM | POA: Diagnosis not present

## 2016-11-09 DIAGNOSIS — F17210 Nicotine dependence, cigarettes, uncomplicated: Secondary | ICD-10-CM | POA: Diagnosis not present

## 2016-11-09 DIAGNOSIS — D649 Anemia, unspecified: Secondary | ICD-10-CM | POA: Diagnosis not present

## 2016-11-09 DIAGNOSIS — N12 Tubulo-interstitial nephritis, not specified as acute or chronic: Secondary | ICD-10-CM | POA: Insufficient documentation

## 2016-11-09 DIAGNOSIS — F419 Anxiety disorder, unspecified: Secondary | ICD-10-CM | POA: Insufficient documentation

## 2016-11-09 LAB — POCT URINALYSIS DIP (DEVICE)
Bilirubin Urine: NEGATIVE
Glucose, UA: NEGATIVE mg/dL
Ketones, ur: NEGATIVE mg/dL
Nitrite: NEGATIVE
PROTEIN: NEGATIVE mg/dL
Specific Gravity, Urine: >=1.03 (ref 1.005–1.030)
UROBILINOGEN UA: 0.2 mg/dL (ref 0.0–1.0)
pH: 6 (ref 5.0–8.0)

## 2016-11-09 MED ORDER — ONDANSETRON 4 MG PO TBDP
4.0000 mg | ORAL_TABLET | Freq: Once | ORAL | Status: AC
Start: 2016-11-09 — End: 2016-11-09
  Administered 2016-11-09: 4 mg via ORAL

## 2016-11-09 MED ORDER — ONDANSETRON 4 MG PO TBDP
ORAL_TABLET | ORAL | Status: AC
  Filled 2016-11-09: qty 1

## 2016-11-09 MED ORDER — ONDANSETRON HCL 8 MG PO TABS: 8.0000 mg | ORAL_TABLET | Freq: Three times a day (TID) | ORAL | 0 refills | Status: DC | PRN

## 2016-11-09 MED ORDER — SULFAMETHOXAZOLE-TRIMETHOPRIM 800-160 MG PO TABS: 1.0000 | ORAL_TABLET | Freq: Two times a day (BID) | ORAL | 0 refills | Status: AC

## 2016-11-09 NOTE — Discharge Instructions (Signed)
Recommend continue Zofran 8mg  evey 8 hours as needed for nausea and vomiting. Recommend start Bactrim antibiotic twice a day as directed. Clear liquids today, may start with bland foods tomorrow if tolerated. Recommend follow-up with your PCP in 2 to 3 days if not improving or go to ER if symptoms worsen or unable to keep down any fluids.

## 2016-11-09 NOTE — ED Triage Notes (Signed)
Pt states since thursday c/o vomiting and diarrhea. Pt vomited x3 today. Pt c/o R flank pain. Pt denies issues with urination.

## 2016-11-10 NOTE — ED Provider Notes (Signed)
[facility]    CSN: [id number] Arrival date & time: 11/09/16  1801     History   Chief Complaint Chief Complaint  Patient presents with  . Emesis  . Diarrhea    HPI Jacqueline Clarke is a 31 y.o. female.   31 year old female presents with diarrhea that started 4 days ago. Then developed nausea and vomiting 3 days ago and has continued today. Also experiencing some chills and right sided flank pain. Denies any fever, nasal congestion, cough, dysuria, hematuria or unusual vaginal discharge. Has been trying to drink water with some success. Unable to keep solid foods down. Symptoms are worse when lying down. Daughter also had diarrhea and similar GI symptoms but has resolved. No unusual foods or travel. Has not tried any medication for symptoms. Only current medication is Fe supplements for anemia.    The history is provided by the patient.    Past Medical History:  Diagnosis Date  . Anemia   . Anxiety   . Asthma   . Blood type, Rh negative   . Cholelithiasis   . Chronic back pain    scoliosis- on percocet  . Depression   . Headache(784.0)   . Hyperemesis complicating pregnancy, antepartum 2013  . 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 Obesity   . Pregnancy induced hypertension   . Scoliosis     Patient Active Problem List   Diagnosis Date Noted  . 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 Depression, postpartum 07/09/2016  . Adjustment insomnia 07/09/2016  . History of ileus 07/06/2016  . Pyelonephritis 07/03/2016  . Gestational hypertension 06/30/2016  . Blood type, Rh negative   . Obesity   . Symptomatic anemia 06/02/2016  . Low vitamin D level 01/03/2016  . Depression 07/09/2015    Past Surgical History:  Procedure Laterality Date  . arm surgery    . CESAREAN SECTION N/A 06/23/2016   Procedure: CESAREAN SECTION;  Surgeon: [place] BingPickens, Charlie, MD;  [facility];  Service: Obstetrics;  Laterality: N/A;  . CHOLECYSTECTOMY    .  DILATION AND CURETTAGE OF UTERUS  2008  . MR WRIST RIGHT     sx to nerves  . NERVE, TENDON AND ARTERY REPAIR Right 06/18/2014   Procedure: NERVE, TENDON AND ARTERY REPAIR;  Surgeon: Bradly BienenstockFred Ortmann, MD;  Location: [facility];  Service: Orthopedics;  Laterality: Right;  . TONSILLECTOMY AND ADENOIDECTOMY    . VAGINAL DELIVERY  06/23/2016   Procedure: VAGINAL DELIVERY;  Surgeon: [place] BingPickens, Charlie, MD;  [facility];  Service: Obstetrics;;    OB History    Gravida Para Term Preterm AB Living   7 5 4 1 2 4    SAB TAB Ectopic Multiple Live Births   2 0 0 1 4       Home Medications    Prior to Admission medications   Medication Sig Start Date End Date Taking? Authorizing Provider  ferrous gluconate (FERGON) 324 MG tablet Take 1 tablet (324 mg total) by mouth 2 (two) times daily with a meal. 06/30/16  Yes [place] BingPickens, Charlie, MD  ondansetron (ZOFRAN) 8 MG tablet Take 1 tablet (8 mg total) by mouth every 8 (eight) hours as needed for nausea or vomiting. 11/09/16   Sudie GrumblingAmyot, Taylah Dubiel Berry, NP  sulfamethoxazole-trimethoprim (BACTRIM DS,SEPTRA DS) 800-160 MG tablet Take 1 tablet by mouth 2 (two) times daily. 11/09/16 11/16/16  Sudie GrumblingAmyot, Mckaylin Bastien Berry, NP    Family History Family History  Problem Relation Age  of Onset  . Heart disease Maternal Grandfather   . Diabetes Maternal Grandfather   . Heart disease Father   . Anesthesia problems Neg Hx     Social History Social History  Substance Use Topics  . Smoking status: Current Every Day Smoker    Packs/day: 0.50    Types: Cigarettes    Last attempt to quit: 12/05/2015  . Smokeless tobacco: Never Used  . Alcohol use No     Comment: Occassionally     Allergies   Patient has no known allergies.   Review of Systems Review of Systems  Constitutional: Positive for appetite change, chills and fatigue. Negative for fever.  HENT: Negative for congestion, mouth sores, postnasal drip, sore throat and trouble swallowing.   Eyes: Negative for  photophobia, pain, redness and visual disturbance.  Respiratory: Negative for cough, chest tightness, shortness of breath and wheezing.   Gastrointestinal: Positive for diarrhea, nausea and vomiting. Negative for abdominal pain and blood in stool.  Genitourinary: Positive for decreased urine volume, flank pain and frequency. Negative for difficulty urinating, dysuria, hematuria, pelvic pain, urgency, vaginal bleeding, vaginal discharge and vaginal pain.  Musculoskeletal: Positive for back pain. Negative for arthralgias, myalgias, neck pain and neck stiffness.  Skin: Negative for rash and wound.  Neurological: Positive for headaches. Negative for dizziness, tremors, seizures, syncope, weakness, light-headedness and numbness.  Hematological: Negative for adenopathy. Does not bruise/bleed easily.     Physical Exam Triage Vital Signs ED Triage Vitals  Enc Vitals Group     BP 11/09/16 1827 120/78     Pulse Rate 11/09/16 1827 69     Resp 11/09/16 1827 18     Temp 11/09/16 1827 98.2 F (36.8 C)     Temp Source 11/09/16 1827 Oral     SpO2 11/09/16 1827 100 %     Weight --      Height --      Head Circumference --      Peak Flow --      Pain Score 11/09/16 1830 8     Pain Loc --      Pain Edu? --      Excl. in GC? --    No data found.   Updated Vital Signs BP 120/78   Pulse 69   Temp 98.2 F (36.8 C) (Oral)   Resp 18   LMP 10/10/2016   SpO2 100%   Visual Acuity Right Eye Distance:   Left Eye Distance:   Bilateral Distance:    Right Eye Near:   Left Eye Near:    Bilateral Near:     Physical Exam  Constitutional: She is oriented to person, place, and time. She appears well-developed and well-nourished. She is cooperative. She appears ill. No distress.  Sitting quietly in exam chair- when asked to move to exam table- vomited.   HENT:  Head: Normocephalic and atraumatic.  Right Ear: External ear normal.  Left Ear: External ear normal.  Mouth/Throat: Oropharynx is clear  and moist.  Eyes: Conjunctivae and EOM are normal.  Neck: Normal range of motion. Neck supple.  Cardiovascular: Normal rate, regular rhythm and normal heart sounds.   No murmur heard. Pulmonary/Chest: Effort normal and breath sounds normal. No respiratory distress. She has no wheezes.  Abdominal: Soft. Normal appearance and bowel sounds are normal. She exhibits no mass. There is no hepatosplenomegaly. There is generalized tenderness and tenderness in the right lower quadrant, suprapubic area and left lower quadrant. There is CVA tenderness (right side  only). There is no rigidity, no rebound and no guarding.  Musculoskeletal: Normal range of motion.  Neurological: She is alert and oriented to person, place, and time.  Skin: Skin is warm and dry. Capillary refill takes less than 2 seconds.  Psychiatric: She has a normal mood and affect. Her behavior is normal. Judgment and thought content normal.     UC Treatments / Results  Labs (all labs ordered are listed, but only abnormal results are displayed) Labs Reviewed  POCT URINALYSIS DIP (DEVICE) - Abnormal; Notable for the following:       Result Value   Hgb urine dipstick SMALL (*)    Leukocytes, UA MODERATE (*)    All other components within normal limits  URINE CULTURE    EKG  EKG Interpretation None       Radiology No results found.  Procedures Procedures (including critical care time)  Medications Ordered in UC Medications  ondansetron (ZOFRAN-ODT) disintegrating tablet 4 mg (4 mg Oral Given 11/09/16 1833)     Initial Impression / Assessment and Plan / UC Course  I have reviewed the triage vital signs and the nursing notes.  Pertinent labs & imaging results that were available during my care of the patient were reviewed by me and considered in my medical decision making (see chart for details).    Since patient vomited in exam room, gave Zofran 4mg  orally now. She felt a little better 20 minutes after receiving  Zofran. Reviewed urinalysis results with patient- discussed positive blood and WBC's- may be UTI- more specifically mild pyelonephritis with right sided flank pain and vomiting. Urine sent for culture. May also be viral GI illness or renal calculi- continue to monitor. Recommend start Bactrim DS 1 tablet twice a day as directed. May continue Zofran 8mg  every 8 hours as needed for nausea and vomiting. Encouraged to continue to push fluids. No solid foods until tomorrow. May take Tylenol as needed for pain. Note written for work. Recommend follow-up pending lab results and with her PCP in 2 to 3 days if not improving or go to ER if symptoms worsen or unable to keep down any fluids.   Final Clinical Impressions(s) / UC Diagnoses   Final diagnoses:  Acute right flank pain  Nausea vomiting and diarrhea    New Prescriptions Discharge Medication List as of 11/09/2016  9:32 PM    START taking these medications   Details  ondansetron (ZOFRAN) 8 MG tablet Take 1 tablet (8 mg total) by mouth every 8 (eight) hours as needed for nausea or vomiting., Starting Sun 11/09/2016, Normal    sulfamethoxazole-trimethoprim (BACTRIM DS,SEPTRA DS) 800-160 MG tablet Take 1 tablet by mouth 2 (two) times daily., Starting Sun 11/09/2016, Until Sun 11/16/2016, Normal         Controlled Substance Prescriptions [place] Controlled Substance Registry consulted? Not Applicable   Sudie Grumbling, NP 11/10/16 [phone]

## 2016-11-11 LAB — URINE CULTURE: SPECIAL REQUESTS: NORMAL

## 2016-11-23 ENCOUNTER — Encounter (HOSPITAL_COMMUNITY): Payer: Self-pay | Admitting: Emergency Medicine

## 2016-11-23 ENCOUNTER — Emergency Department (HOSPITAL_COMMUNITY): Payer: Medicaid Other

## 2016-11-23 ENCOUNTER — Emergency Department (HOSPITAL_COMMUNITY)
Admission: EM | Admit: 2016-11-23 | Discharge: 2016-11-23 | Disposition: A | Discharge status: Home / Self Care | Payer: Medicaid Other | Attending: Emergency Medicine | Admitting: Emergency Medicine

## 2016-11-23 DIAGNOSIS — F17210 Nicotine dependence, cigarettes, uncomplicated: Secondary | ICD-10-CM | POA: Diagnosis not present

## 2016-11-23 DIAGNOSIS — R55 Syncope and collapse: Secondary | ICD-10-CM | POA: Insufficient documentation

## 2016-11-23 DIAGNOSIS — J45909 Unspecified asthma, uncomplicated: Secondary | ICD-10-CM | POA: Diagnosis not present

## 2016-11-23 DIAGNOSIS — Z79899 Other long term (current) drug therapy: Secondary | ICD-10-CM | POA: Diagnosis not present

## 2016-11-23 DIAGNOSIS — Z733 Stress, not elsewhere classified: Secondary | ICD-10-CM | POA: Insufficient documentation

## 2016-11-23 DIAGNOSIS — R112 Nausea with vomiting, unspecified: Secondary | ICD-10-CM | POA: Insufficient documentation

## 2016-11-23 LAB — COMPREHENSIVE METABOLIC PANEL
ALK PHOS: 73 U/L (ref 38–126)
ALT: 10 U/L — ABNORMAL LOW (ref 14–54)
ANION GAP: 5 (ref 5–15)
AST: 17 U/L (ref 15–41)
Albumin: 3 g/dL — ABNORMAL LOW (ref 3.5–5.0)
BILIRUBIN TOTAL: 0.5 mg/dL (ref 0.3–1.2)
BUN: 6 mg/dL (ref 6–20)
CALCIUM: 8.4 mg/dL — AB (ref 8.9–10.3)
CO2: 25 mmol/L (ref 22–32)
Chloride: 110 mmol/L (ref 101–111)
Creatinine, Ser: 0.84 mg/dL (ref 0.44–1.00)
GFR calc Af Amer: >60 mL/min (ref >60)
GLUCOSE: 78 mg/dL (ref 65–99)
POTASSIUM: 3.5 mmol/L (ref 3.5–5.1)
Sodium: 140 mmol/L (ref 135–145)
TOTAL PROTEIN: 6.1 g/dL — AB (ref 6.5–8.1)

## 2016-11-23 LAB — CBC WITH DIFFERENTIAL/PLATELET
BASOS ABS: 0 10*3/uL (ref 0.0–0.1)
BASOS PCT: 0 %
EOS ABS: 0.2 10*3/uL (ref 0.0–0.7)
EOS PCT: 3 %
HCT: 32.9 % — ABNORMAL LOW (ref 36.0–46.0)
Hemoglobin: 10.3 g/dL — ABNORMAL LOW (ref 12.0–15.0)
LYMPHS PCT: 34 %
Lymphs Abs: 2.4 10*3/uL (ref 0.7–4.0)
MCH: 24.6 pg — ABNORMAL LOW (ref 26.0–34.0)
MCHC: 31.3 g/dL (ref 30.0–36.0)
MCV: 78.7 fL (ref 78.0–100.0)
MONO ABS: 0.4 10*3/uL (ref 0.1–1.0)
Monocytes Relative: 5 %
Neutro Abs: 4 10*3/uL (ref 1.7–7.7)
Neutrophils Relative %: 58 %
PLATELETS: 268 10*3/uL (ref 150–400)
RBC: 4.18 MIL/uL (ref 3.87–5.11)
RDW: 16.5 % — AB (ref 11.5–15.5)
WBC: 6.9 10*3/uL (ref 4.0–10.5)

## 2016-11-23 LAB — URINALYSIS, ROUTINE W REFLEX MICROSCOPIC
BILIRUBIN URINE: NEGATIVE
GLUCOSE, UA: NEGATIVE mg/dL
Hgb urine dipstick: NEGATIVE
Ketones, ur: NEGATIVE mg/dL
LEUKOCYTES UA: NEGATIVE
NITRITE: NEGATIVE
Protein, ur: NEGATIVE mg/dL
Specific Gravity, Urine: 1.01 (ref 1.005–1.030)
pH: 6 (ref 5.0–8.0)

## 2016-11-23 LAB — I-STAT TROPONIN, ED
Troponin i, poc: 0 ng/mL (ref 0.00–0.08)
Troponin i, poc: 0 ng/mL (ref 0.00–0.08)

## 2016-11-23 LAB — PREGNANCY, URINE: Preg Test, Ur: NEGATIVE

## 2016-11-23 MED ORDER — HYDROXYZINE HCL 25 MG PO TABS: 25.0000 mg | ORAL_TABLET | Freq: Three times a day (TID) | ORAL | 0 refills | Status: DC | PRN

## 2016-11-23 NOTE — ED Provider Notes (Signed)
Assumed care from Dr. Corlis LeakMackuen at 4 PM. Briefly, the patient is a 31 y.o. female with PMHx of  has a past medical history of Anemia, Anxiety, Asthma, Blood type, Rh negative, Cholelithiasis, Chronic back pain, Depression, Headache(784.0), Hyperemesis complicating pregnancy, antepartum (2013), Infection, Obesity, Pregnancy induced hypertension, and Scoliosis. here with one episode of emesis, shaking, ? LOC. Lab work reassuring. CT pending. This occurs in setting of multiple recent stressors, suspect related to this/vagal episode. Plan to f/u labs, d/c if unremarkable.  Labs Reviewed  CBC WITH DIFFERENTIAL/PLATELET - Abnormal; Notable for the following components:      Result Value   Hemoglobin 10.3 (*)    HCT 32.9 (*)    MCH 24.6 (*)    RDW 16.5 (*)    All other components within normal limits  URINALYSIS, ROUTINE W REFLEX MICROSCOPIC - Abnormal; Notable for the following components:   Color, Urine STRAW (*)    All other components within normal limits  PREGNANCY, URINE  COMPREHENSIVE METABOLIC PANEL    Course of Care: -Patient symptoms are improving.  Lab work is unremarkable.  She feels this is stress related which is reasonable.  EKG nonischemic and troponins negative.  Will discharge home with outpatient follow-up.   Shaune PollackIsaacs, Britanee Vanblarcom, MD 11/23/16 [phone]

## 2016-11-23 NOTE — ED Notes (Signed)
Recollected blood for istat troponin and ran new test.  First blood sample too old.  I will send edit sheet to POC to credit pt's acct.

## 2016-11-23 NOTE — ED Notes (Signed)
MD at bedside. 

## 2016-11-23 NOTE — ED Notes (Signed)
Printed work note

## 2016-11-23 NOTE — ED Triage Notes (Signed)
Pt arrives via [facility] from work, ems reports pt went to the restroom, was found on the toilet "shaking" pt had syncopal episode but remained on toilet, no falls. Ems reports pt was not post ictal upon their arrival. Reports pt has been under a lot of stress. Pt a/ox4. Vss.

## 2016-11-23 NOTE — Discharge Instructions (Signed)
Is unclear what happened today.  We recommend that you do not drive a car or operate heavy machinery until you are seen and cleared by your primary care physician.  We do not think is likely seizure but just to be careful.  We will need to be sure to rest, drink plenty of fluids.  Return with any chest pain, or other concerns.

## 2016-11-23 NOTE — ED Provider Notes (Signed)
[facility]   CSN: [id number] Arrival date & time: 11/23/16  1346     History   Chief Complaint Chief Complaint  Patient presents with  . Loss of Consciousness    HPI Jacqueline Clarke is a 31 y.o. female.  HPI  She is a very pleasant 31 year old female.  Patient reporting that she had an event today.  Apparently she felt nauseated went to the bathroom to vomit she did vomit once.  Then she had a shaking episode on the toilet.  EMS arrived does not think that she was having seizure-like activity.  She had no appreciable postictal period.  This all happened while at work.  Patient reports that she has been under a lot of stress lately.  She works 3 jobs and has 6 children.  She is accompanied by her mom who is also worried that she has increased stress.  Patient thinks it might have been a stress reaction.  Patient was recently treated for urinary tract infection.  Past Medical History:  Diagnosis Date  . Anemia   . Anxiety   . Asthma   . Blood type, Rh negative   . Cholelithiasis   . Chronic back pain    scoliosis- on percocet  . Depression   . Headache(784.0)   . Hyperemesis complicating pregnancy, antepartum 2013  . 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 Obesity   . Pregnancy induced hypertension   . Scoliosis     Patient Active Problem List   Diagnosis Date Noted  . 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 Depression, postpartum 07/09/2016  . Adjustment insomnia 07/09/2016  . History of ileus 07/06/2016  . Pyelonephritis 07/03/2016  . Gestational hypertension 06/30/2016  . Blood type, Rh negative   . Obesity   . Symptomatic anemia 06/02/2016  . Low vitamin D level 01/03/2016  . Depression 07/09/2015    Past Surgical History:  Procedure Laterality Date  . arm surgery    . CHOLECYSTECTOMY    . DILATION AND CURETTAGE OF UTERUS  2008  . MR WRIST RIGHT     sx to nerves  . TONSILLECTOMY AND  ADENOIDECTOMY      OB History    Gravida Para Term Preterm AB Living   7 5 4 1 2 4    SAB TAB Ectopic Multiple Live Births   2 0 0 1 4       Home Medications    Prior to Admission medications   Medication Sig Start Date End Date Taking? Authorizing Provider  ferrous gluconate (FERGON) 324 MG tablet Take 1 tablet (324 mg total) by mouth 2 (two) times daily with a meal. 06/30/16   [place] BingPickens, Charlie, MD  ondansetron (ZOFRAN) 8 MG tablet Take 1 tablet (8 mg total) by mouth every 8 (eight) hours as needed for nausea or vomiting. 11/09/16   Sudie GrumblingAmyot, Ann Berry, NP    Family History Family History  Problem Relation Age of Onset  . Heart disease Maternal Grandfather   . Diabetes Maternal Grandfather   . Heart disease Father   . Anesthesia problems Neg Hx     Social History Social History   Tobacco Use  . Smoking status: Current Every Day Smoker    Packs/day: 0.50    Types: Cigarettes    Last attempt to quit: 12/05/2015    Years since quitting: 0.9  . Smokeless tobacco: Never Used  Substance Use Topics  . Alcohol use: No  Alcohol/week: 0.0 oz    Comment: Occassionally  . Drug use: No     Allergies   Patient has no known allergies.   Review of Systems Review of Systems  Constitutional: Negative for activity change, fatigue and fever.  HENT: Negative for congestion.   Respiratory: Negative for shortness of breath.   Cardiovascular: Negative for chest pain.  Gastrointestinal: Negative for abdominal pain.  Genitourinary: Negative for dysuria.  Musculoskeletal: Negative for back pain.  Skin: Negative for color change.  Neurological: Negative for dizziness.  Psychiatric/Behavioral: Negative for agitation.     Physical Exam Updated Vital Signs BP 135/84   Physical Exam  Constitutional: She is oriented to person, place, and time. She appears well-developed and well-nourished.  Obese well appearing feamle  HENT:  Head: Normocephalic and atraumatic.  Eyes: Right  eye exhibits no discharge. Left eye exhibits no discharge.  Cardiovascular: Normal rate and regular rhythm.  No murmur heard. Pulmonary/Chest: Effort normal and breath sounds normal.  Abdominal: Soft. She exhibits no distension. There is no tenderness.  Neurological: She is oriented to person, place, and time.  Skin: Skin is warm and dry. She is not diaphoretic.  Psychiatric: She has a normal mood and affect. Her behavior is normal.  Nursing note and vitals reviewed.    ED Treatments / Results  Labs (all labs ordered are listed, but only abnormal results are displayed) Labs Reviewed  COMPREHENSIVE METABOLIC PANEL  CBC WITH DIFFERENTIAL/PLATELET  URINALYSIS, ROUTINE W REFLEX MICROSCOPIC  PREGNANCY, URINE    EKG  EKG Interpretation None       Radiology No results found.  Procedures Procedures (including critical care time)  Medications Ordered in ED Medications - No data to display   Initial Impression / Assessment and Plan / ED Course  I have reviewed the triage vital signs and the nursing notes.  Pertinent labs & imaging results that were available during my care of the patient were reviewed by me and considered in my medical decision making (see chart for details).    Patient is a very pleasant 31 year old female.  Patient reporting that she had an event today.  Apparently she felt nauseated went to the bathroom to vomit she did vomit once.  Then she had a shaking episode on the toilet.  EMS arrived does not think that she was having seizure-like activity.  She had no appreciable postictal period.  This all happened while at work.  Patient reports that she has been under a lot of stress lately.  She works 3 jobs and has 6 children.  She is accompanied by her mom who is also worried that she has increased stress.  Patient thinks it might have been a stress reaction.  Will get EKG, pregnancy, baseline labs and CT.  If negative plan to have patient follow-up with primary  care physician.  Instructions given not to drive until she can follow-up with her PCP.  I think patient may be undergoing a lot of stress.  Without any evidence of electrolyte derangements, or CT evidence of intracranial issues, will have her follow-up with her primary care physician for further workup as an outpatient.    Final Clinical Impressions(s) / ED Diagnoses   Final diagnoses:  None    New Prescriptions This SmartLink is deprecated. Use AVSMEDLIST instead to display the medication list for a patient.   Abelino Derrick, MD 11/25/16 2241

## 2016-12-17 ENCOUNTER — Emergency Department (HOSPITAL_COMMUNITY): Payer: Medicaid Other

## 2016-12-17 ENCOUNTER — Emergency Department (HOSPITAL_COMMUNITY)
Admission: EM | Admit: 2016-12-17 | Discharge: 2016-12-18 | Disposition: A | Discharge status: Home / Self Care | Payer: Medicaid Other | Attending: Emergency Medicine | Admitting: Emergency Medicine

## 2016-12-17 ENCOUNTER — Encounter (HOSPITAL_COMMUNITY): Payer: Self-pay | Admitting: Emergency Medicine

## 2016-12-17 DIAGNOSIS — N946 Dysmenorrhea, unspecified: Secondary | ICD-10-CM | POA: Insufficient documentation

## 2016-12-17 DIAGNOSIS — M79602 Pain in left arm: Secondary | ICD-10-CM | POA: Diagnosis not present

## 2016-12-17 DIAGNOSIS — L509 Urticaria, unspecified: Secondary | ICD-10-CM | POA: Diagnosis not present

## 2016-12-17 DIAGNOSIS — J45909 Unspecified asthma, uncomplicated: Secondary | ICD-10-CM | POA: Insufficient documentation

## 2016-12-17 DIAGNOSIS — R1084 Generalized abdominal pain: Secondary | ICD-10-CM

## 2016-12-17 DIAGNOSIS — R1031 Right lower quadrant pain: Secondary | ICD-10-CM | POA: Diagnosis present

## 2016-12-17 DIAGNOSIS — N898 Other specified noninflammatory disorders of vagina: Secondary | ICD-10-CM | POA: Diagnosis not present

## 2016-12-17 DIAGNOSIS — F17210 Nicotine dependence, cigarettes, uncomplicated: Secondary | ICD-10-CM | POA: Diagnosis not present

## 2016-12-17 LAB — COMPREHENSIVE METABOLIC PANEL
ALBUMIN: 3.3 g/dL — AB (ref 3.5–5.0)
ALT: 11 U/L — AB (ref 14–54)
AST: 19 U/L (ref 15–41)
Alkaline Phosphatase: 69 U/L (ref 38–126)
Anion gap: 6 (ref 5–15)
BUN: 12 mg/dL (ref 6–20)
CALCIUM: 8.6 mg/dL — AB (ref 8.9–10.3)
CHLORIDE: 107 mmol/L (ref 101–111)
CO2: 25 mmol/L (ref 22–32)
Creatinine, Ser: 0.69 mg/dL (ref 0.44–1.00)
GFR calc Af Amer: >60 mL/min (ref >60)
Glucose, Bld: 91 mg/dL (ref 65–99)
POTASSIUM: 3.4 mmol/L — AB (ref 3.5–5.1)
Sodium: 138 mmol/L (ref 135–145)
TOTAL PROTEIN: 6.5 g/dL (ref 6.5–8.1)
Total Bilirubin: 0.6 mg/dL (ref 0.3–1.2)

## 2016-12-17 LAB — URINALYSIS, ROUTINE W REFLEX MICROSCOPIC
Bacteria, UA: NONE SEEN
Bilirubin Urine: NEGATIVE
GLUCOSE, UA: NEGATIVE mg/dL
KETONES UR: NEGATIVE mg/dL
NITRITE: NEGATIVE
PROTEIN: NEGATIVE mg/dL
Specific Gravity, Urine: 1.015 (ref 1.005–1.030)
pH: 6 (ref 5.0–8.0)

## 2016-12-17 LAB — CBC
HCT: 32.9 % — ABNORMAL LOW (ref 36.0–46.0)
HEMOGLOBIN: 10.4 g/dL — AB (ref 12.0–15.0)
MCH: 25.4 pg — ABNORMAL LOW (ref 26.0–34.0)
MCHC: 31.6 g/dL (ref 30.0–36.0)
MCV: 80.2 fL (ref 78.0–100.0)
Platelets: 252 10*3/uL (ref 150–400)
RBC: 4.1 MIL/uL (ref 3.87–5.11)
RDW: 17.1 % — ABNORMAL HIGH (ref 11.5–15.5)
WBC: 7.7 10*3/uL (ref 4.0–10.5)

## 2016-12-17 LAB — I-STAT BETA HCG BLOOD, ED (MC, WL, AP ONLY): I-stat hCG, quantitative: <5 m[IU]/mL (ref <5)

## 2016-12-17 LAB — LIPASE, BLOOD: Lipase: 24 U/L (ref 11–51)

## 2016-12-17 MED ORDER — ONDANSETRON HCL 4 MG/2ML IJ SOLN
4.0000 mg | Freq: Once | INTRAMUSCULAR | Status: AC
  Administered 2016-12-17: 4 mg via INTRAVENOUS
  Filled 2016-12-17: qty 2

## 2016-12-17 MED ORDER — KETOROLAC TROMETHAMINE 30 MG/ML IJ SOLN
30.0000 mg | Freq: Once | INTRAMUSCULAR | Status: AC
  Administered 2016-12-17: 30 mg via INTRAVENOUS
  Filled 2016-12-17: qty 1

## 2016-12-17 MED ORDER — NAPROXEN 500 MG PO TABS: 500.0000 mg | ORAL_TABLET | Freq: Two times a day (BID) | ORAL | 0 refills | Status: DC

## 2016-12-17 MED ORDER — DIPHENHYDRAMINE HCL 50 MG/ML IJ SOLN
25.0000 mg | Freq: Once | INTRAMUSCULAR | Status: AC
  Administered 2016-12-17: 25 mg via INTRAVENOUS
  Filled 2016-12-17: qty 1

## 2016-12-17 MED ORDER — IOPAMIDOL (ISOVUE-300) INJECTION 61%
100.0000 mL | Freq: Once | INTRAVENOUS | Status: AC | PRN
  Administered 2016-12-17: 100 mL via INTRAVENOUS

## 2016-12-17 MED ORDER — MORPHINE SULFATE (PF) 4 MG/ML IV SOLN
4.0000 mg | INTRAVENOUS | Status: DC | PRN
  Administered 2016-12-17 (×2): 4 mg via INTRAVENOUS
  Filled 2016-12-17 (×3): qty 1

## 2016-12-17 MED ORDER — IOPAMIDOL (ISOVUE-300) INJECTION 61%
INTRAVENOUS | Status: AC
  Filled 2016-12-17: qty 100

## 2016-12-17 NOTE — ED Notes (Signed)
Pt complaining of lower left arm burning and welts forming after zofran and morphine administration. Dr notified. Ice pack given.

## 2016-12-17 NOTE — ED Notes (Signed)
X-ray tech at bedside attempting to gain IV access. She was unsuccessful. Will notify another RN of need for ultrasound IV access for CT scan and meds.

## 2016-12-17 NOTE — ED Provider Notes (Signed)
[facility]-EMERGENCY DEPT Provider Note   CSN: [id number] Arrival date & time: 12/17/16  1255     History   Chief Complaint Chief Complaint  Patient presents with  . Abdominal Pain  . Back Pain    HPI Jacqueline Clarke is a 31 y.o. female. Cheatle is abdominal pain, back pain.  HPI:  31 year old female. Reports right lower back pain for the last several days. Today she is also right lower quadrant pain. It is constant. It hurts to move. She is on her menses now. No dysuria or frequency. No nausea or vomiting. No history of stones.  Past Medical History:  Diagnosis Date  . Anemia   . Anxiety   . Asthma   . Blood type, Rh negative   . Cholelithiasis   . Chronic back pain    scoliosis- on percocet  . Depression   . Headache(784.0)   . Hyperemesis complicating pregnancy, antepartum 2013  . 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 Obesity   . Pregnancy induced hypertension   . Scoliosis     Patient Active Problem List   Diagnosis Date Noted  . 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 Depression, postpartum 07/09/2016  . Adjustment insomnia 07/09/2016  . History of ileus 07/06/2016  . Pyelonephritis 07/03/2016  . Gestational hypertension 06/30/2016  . Blood type, Rh negative   . Obesity   . Symptomatic anemia 06/02/2016  . Low vitamin D level 01/03/2016  . Depression 07/09/2015    Past Surgical History:  Procedure Laterality Date  . arm surgery    . CESAREAN SECTION N/A 06/23/2016   Procedure: CESAREAN SECTION;  Surgeon: [place] Bing, MD;  [facility];  Service: Obstetrics;  Laterality: N/A;  . CHOLECYSTECTOMY    . DILATION AND CURETTAGE OF UTERUS  2008  . MR WRIST RIGHT     sx to nerves  . NERVE, TENDON AND ARTERY REPAIR Right 06/18/2014   Procedure: NERVE, TENDON AND ARTERY REPAIR;  Surgeon: Bradly Bienenstock, MD;  Location: [facility] OR;  Service: Orthopedics;  Laterality: Right;  . TONSILLECTOMY AND ADENOIDECTOMY    .  VAGINAL DELIVERY  06/23/2016   Procedure: VAGINAL DELIVERY;  Surgeon: [place] Bing, MD;  Location: [facility] BIRTHING SUITES;  Service: Obstetrics;;    OB History    Gravida Para Term Preterm AB Living   7 5 4 1 2 4    SAB TAB Ectopic Multiple Live Births   2 0 0 1 4       Home Medications    Prior to Admission medications   Medication Sig Start Date End Date Taking? Authorizing Provider  acetaminophen (TYLENOL) 500 MG tablet Take 1,000-1,500 mg by mouth every 6 (six) hours as needed for moderate pain.   Yes [provider]  hydrOXYzine (ATARAX/VISTARIL) 25 MG tablet Take 1 tablet (25 mg total) every 8 (eight) hours as needed by mouth for anxiety. 11/23/16  Yes Shaune Pollack, MD  ibuprofen (ADVIL,MOTRIN) 200 MG tablet Take 400 mg by mouth every 6 (six) hours as needed for moderate pain.   Yes [provider]  ferrous gluconate (FERGON) 324 MG tablet Take 1 tablet (324 mg total) by mouth 2 (two) times daily with a meal. Patient not taking: Reported on 12/17/2016 06/30/16    Bing, MD  naproxen (NAPROSYN) 500 MG tablet Take 1 tablet (500 mg total) by mouth 2 (two) times daily. 12/17/16   Rolland Porter, MD  ondansetron (ZOFRAN) 8 MG tablet Take 1  tablet (8 mg total) by mouth every 8 (eight) hours as needed for nausea or vomiting. Patient not taking: Reported on 12/17/2016 11/09/16   Sudie GrumblingAmyot, Ann Berry, NP    Family History Family History  Problem Relation Age of Onset  . Heart disease Maternal Grandfather   . Diabetes Maternal Grandfather   . Heart disease Father   . Anesthesia problems Neg Hx     Social History Social History   Tobacco Use  . Smoking status: Current Every Day Smoker    Packs/day: 0.50    Types: Cigarettes    Last attempt to quit: 12/05/2015    Years since quitting: 1.0  . Smokeless tobacco: Never Used  Substance Use Topics  . Alcohol use: No    Alcohol/week: 0.0 oz    Comment: Occassionally  . Drug use: No     Allergies   Patient  has no known allergies.   Review of Systems Review of Systems  Constitutional: Negative for appetite change, chills, diaphoresis, fatigue and fever.  HENT: Negative for mouth sores, sore throat and trouble swallowing.   Eyes: Negative for visual disturbance.  Respiratory: Negative for cough, chest tightness, shortness of breath and wheezing.   Cardiovascular: Negative for chest pain.  Gastrointestinal: Positive for abdominal pain. Negative for abdominal distention, diarrhea, nausea and vomiting.  Endocrine: Negative for polydipsia, polyphagia and polyuria.  Genitourinary: Positive for flank pain. Negative for dysuria, frequency and hematuria.  Musculoskeletal: Negative for gait problem.  Skin: Negative for color change, pallor and rash.  Neurological: Negative for dizziness, syncope, light-headedness and headaches.  Hematological: Does not bruise/bleed easily.  Psychiatric/Behavioral: Negative for behavioral problems and confusion.     Physical Exam Updated Vital Signs BP 105/86   Pulse 78   Temp 98.6 F (37 C) (Oral)   Resp 18   Ht 5\' 11"  (1.803 m)   Wt 123.4 kg (272 lb)   SpO2 98%   Breastfeeding? No   BMI 37.94 kg/m   Physical Exam  Constitutional: She is oriented to person, place, and time. She appears well-developed and well-nourished. No distress.  HENT:  Head: Normocephalic.  Eyes: Conjunctivae are normal. Pupils are equal, round, and reactive to light. No scleral icterus.  Neck: Normal range of motion. Neck supple. No thyromegaly present.  Cardiovascular: Normal rate and regular rhythm. Exam reveals no gallop and no friction rub.  No murmur heard. Pulmonary/Chest: Effort normal and breath sounds normal. No respiratory distress. She has no wheezes. She has no rales.  Abdominal: Soft. Bowel sounds are normal. She exhibits no distension. There is no tenderness. There is no rebound.  Tenderness and right lower quadrant without rebound.  Musculoskeletal: Normal range  of motion.  Neurological: She is alert and oriented to person, place, and time.  Skin: Skin is warm and dry. No rash noted.  Psychiatric: She has a normal mood and affect. Her behavior is normal.     ED Treatments / Results  Labs (all labs ordered are listed, but only abnormal results are displayed) Labs Reviewed  COMPREHENSIVE METABOLIC PANEL - Abnormal; Notable for the following components:      Result Value   Potassium 3.4 (*)    Calcium 8.6 (*)    Albumin 3.3 (*)    ALT 11 (*)    All other components within normal limits  CBC - Abnormal; Notable for the following components:   Hemoglobin 10.4 (*)    HCT 32.9 (*)    MCH 25.4 (*)    RDW  17.1 (*)    All other components within normal limits  URINALYSIS, ROUTINE W REFLEX MICROSCOPIC - Abnormal; Notable for the following components:   Hgb urine dipstick LARGE (*)    Leukocytes, UA TRACE (*)    Squamous Epithelial / LPF 0-5 (*)    All other components within normal limits  LIPASE, BLOOD  I-STAT BETA HCG BLOOD, ED ([facility], [facility], [facility] ONLY)    EKG  EKG Interpretation None       Radiology Ct Abdomen Pelvis W Contrast  Result Date: 12/17/2016 CLINICAL DATA:  Lower back pain suprapubic pain history of C-section EXAM: CT ABDOMEN AND PELVIS WITH CONTRAST TECHNIQUE: Multidetector CT imaging of the abdomen and pelvis was performed using the standard protocol following bolus administration of intravenous contrast. CONTRAST:  100mL ISOVUE-300 IOPAMIDOL (ISOVUE-300) INJECTION 61% COMPARISON:  10/13/2000, 07/13/2016, 11/09/2012 FINDINGS: Lower chest: Lung bases demonstrate no acute consolidation or effusion. Normal heart size. Hepatobiliary: Surgical clips of the gallbladder fossa. Minimal intra and extrahepatic biliary enlargement, stable compared to prior. Pancreas: Unremarkable. No pancreatic ductal dilatation or surrounding inflammatory changes. Spleen: Multiple hypodense splenic lesions, stable since June 2018, slight increase since 2014  Adrenals/Urinary Tract: Nonobstructing punctate stone in the lower pole of the right kidney. Adrenal glands are within normal limits. No hydronephrosis. The bladder is unremarkable. Stomach/Bowel: Stomach is within normal limits. Appendix appears normal. No evidence of bowel wall thickening, distention, or inflammatory changes. Vascular/Lymphatic: No significant vascular findings are present. No enlarged abdominal or pelvic lymph nodes. Reproductive: Uterus and bilateral adnexa are unremarkable. Other: Trace free fluid in the pelvis. No free air. Fat in the umbilicus. Musculoskeletal: No acute or significant osseous findings. IMPRESSION: 1. No CT evidence for acute intra-abdominal or pelvic abnormality. 2. Punctate nonobstructing stone in the lower pole of the right kidney 3. Stable mild intra and extra hepatic biliary enlargement likely due to surgical change 4. Stable nonspecific hypodensities within the spleen Electronically Signed   By: Jasmine PangKim  Fujinaga M.D.   On: 12/17/2016 22:12    Procedures Procedures (including critical care time)  Medications Ordered in ED Medications  morphine 4 MG/ML injection 4 mg (4 mg Intravenous Given 12/17/16 2217)  iopamidol (ISOVUE-300) 61 % injection (not administered)  ketorolac (TORADOL) 30 MG/ML injection 30 mg (not administered)  ondansetron (ZOFRAN) injection 4 mg (4 mg Intravenous Given 12/17/16 2025)  diphenhydrAMINE (BENADRYL) injection 25 mg (25 mg Intravenous Given 12/17/16 2045)  iopamidol (ISOVUE-300) 61 % injection 100 mL (100 mLs Intravenous Contrast Given 12/17/16 2146)     Initial Impression / Assessment and Plan / ED Course  I have reviewed the triage vital signs and the nursing notes.  Pertinent labs & imaging results that were available during my care of the patient were reviewed by me and considered in my medical decision making (see chart for details).   vaginal discharge. Does have some bleeding consistent with menses. Not pregnant. No  leukocytosis. Urine shows blood. CT scan shows no stone or hives. No appendicitis. No acute process. Patient given IV morphine and Zofran. She had urticaria in her arm following this and this resolved with Benadryl. Will plan ultrasound rule out torsion at this is negative would be appropriate for discharge home with NSAIDs.  Final Clinical Impressions(s) / ED Diagnoses   Final diagnoses:  Generalized abdominal pain  Dysmenorrhea   Ultrasound shows increased pelvic blood flow. No torsion. No other abnormalities. Appropriate for discharge home.   ED Discharge Orders        Ordered  naproxen (NAPROSYN) 500 MG tablet  2 times daily     12/17/16 2337       Rolland Porter, MD 12/23/16 [phone]

## 2016-12-17 NOTE — ED Notes (Signed)
This RN attempted to gain IV access x2. Was not able to gain access.

## 2016-12-17 NOTE — ED Triage Notes (Signed)
Pt comes in with complaints of lower back pain that started about a week ago.  Also states last night that pain began to wrap around to her suprapubic area below her c-section incision.  Had c-section 5 months ago. Endorses nausea without vomiting. Denies breast feeding at this time. Does state she has had soft stool today.

## 2017-01-07 ENCOUNTER — Other Ambulatory Visit: Payer: Self-pay

## 2017-01-07 ENCOUNTER — Encounter (HOSPITAL_COMMUNITY): Payer: Self-pay

## 2017-01-07 ENCOUNTER — Emergency Department (HOSPITAL_COMMUNITY)
Admission: EM | Admit: 2017-01-07 | Discharge: 2017-01-07 | Disposition: A | Discharge status: Home / Self Care | Payer: Medicaid Other | Attending: Emergency Medicine | Admitting: Emergency Medicine

## 2017-01-07 DIAGNOSIS — J45909 Unspecified asthma, uncomplicated: Secondary | ICD-10-CM | POA: Diagnosis not present

## 2017-01-07 DIAGNOSIS — Z79899 Other long term (current) drug therapy: Secondary | ICD-10-CM | POA: Insufficient documentation

## 2017-01-07 DIAGNOSIS — K0889 Other specified disorders of teeth and supporting structures: Secondary | ICD-10-CM

## 2017-01-07 DIAGNOSIS — K047 Periapical abscess without sinus: Secondary | ICD-10-CM | POA: Diagnosis not present

## 2017-01-07 DIAGNOSIS — F17210 Nicotine dependence, cigarettes, uncomplicated: Secondary | ICD-10-CM | POA: Diagnosis not present

## 2017-01-07 LAB — I-STAT BETA HCG BLOOD, ED (MC, WL, AP ONLY): I-stat hCG, quantitative: <5 m[IU]/mL (ref <5)

## 2017-01-07 MED ORDER — HYDROCODONE-ACETAMINOPHEN 5-325 MG PO TABS
2.0000 | ORAL_TABLET | Freq: Once | ORAL | Status: AC
  Administered 2017-01-07: 2 via ORAL
  Filled 2017-01-07: qty 2

## 2017-01-07 MED ORDER — AMOXICILLIN 500 MG PO CAPS: 500.0000 mg | ORAL_CAPSULE | Freq: Three times a day (TID) | ORAL | 0 refills | Status: DC

## 2017-01-07 MED ORDER — CLINDAMYCIN HCL 150 MG PO CAPS: 450.0000 mg | ORAL_CAPSULE | Freq: Three times a day (TID) | ORAL | 0 refills | Status: AC

## 2017-01-07 NOTE — Discharge Instructions (Addendum)
Take antibiotics as directed. Please take all of your antibiotics until finished.  You can take Tylenol or Ibuprofen as directed for pain. You can alternate Tylenol and Ibuprofen every 4 hours. If you take Tylenol at 1pm, then you can take Ibuprofen at 5pm. Then you can take Tylenol again at 9pm.   The exam and treatment you received today has been provided on an emergency basis only. This is not a substitute for complete medical or dental care. If your problem worsens or new symptoms (problems) appear, and you are unable to arrange prompt follow-up care with your dentist, call or return to this location. If you do not have a dentist, please follow-up with one on the list provided  CALL YOUR DENTIST OR RETURN IMMEDIATELY IF you develop a fever, rash, difficulty breathing or swallowing, neck or facial swelling, or other potentially serious concernss.    Please follow-up with one of the dental clinics provided to you below or in your paperwork. Call and tell them you were seen in the Emergency Dept and arrange for an appointment. You may have to call multiple places in order to find a place to be seen.  Dental Assistance If the dentist on-call cannot see you, please use the resources below:   Patients with Medicaid: [facility] [phone], [phone] [address], [phone]  If unable to pay, or uninsured, contact [facility] [phone]) or [facility] [phone] in [place], [phone] in [place]) to become qualified for the adult dental clinic  Other Low-Cost Community Dental Services: [facility]- [address], [place], [place], [address]    [phone], Ext. [phone]    2nd and 4th Thursday of the month at 6:30am    10 clients each day by appointment, can sometimes see walk-in     patients if someone does not show for an appointment [facility]- [address], [place], [place], [address]    [phone] [facility]- [address], [place], [place], [address]    [phone]  [facility]- [phone] [facility]- [phone] [facility]- [phone]

## 2017-01-07 NOTE — ED Triage Notes (Signed)
Patient c/o right lower dental pain and slight facial swelling x 3 days.

## 2017-01-07 NOTE — ED Notes (Addendum)
This RN went to patient's bedside to discharge patient and found pt nauseous and in a lot of pain. Patient now requesting dental block to help with the pain. PA made aware.

## 2017-01-07 NOTE — ED Provider Notes (Signed)
[facility]-EMERG[facility] DEPT Provider Note   CSN: [id number] Arrival date & time: 01/07/17  0815     History   Chief Complaint Chief Complaint  Patient presents with  . Dental Pain  . Facial Swelling    HPI Jacqueline Clarke is a 31 y.o. female Modena JanskyZentz for evaluation of 3 days of dental pain and right lower facial swelling.  Patient reports that pain has progressively worsened.  Jacqueline Clarke has been taking ibuprofen for pain with minimal improvement.  Jacqueline Clarke has not tried any other medications.  Patient came today because pain was becoming more severe.  Patient also notes that Jacqueline Clarke is having some swelling to the right lower face.  Jacqueline Clarke states that Jacqueline Clarke is still able to tolerate p.o. and her secretions but reports worsening pain with attempting to swallow.  Patient reports that Jacqueline Clarke went to go get blood drawn yesterday and her pressure was 99.0 but otherwise denies any other fevers.  Patient denies any chest pain, difficulty breathing, sore throat.  Patient does not see a dentist.  The history is provided by the patient.    Past Medical History:  Diagnosis Date  . Anemia   . Anxiety   . Asthma   . Blood type, Rh negative   . Cholelithiasis   . Chronic back pain    scoliosis- on percocet  . Depression   . Headache(784.0)   . Hyperemesis complicating pregnancy, antepartum 2013  . 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 Obesity   . Pregnancy induced hypertension   . Scoliosis     Patient Active Problem List   Diagnosis Date Noted  . 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 Depression, postpartum 07/09/2016  . Adjustment insomnia 07/09/2016  . History of ileus 07/06/2016  . Pyelonephritis 07/03/2016  . Gestational hypertension 06/30/2016  . Blood type, Rh negative   . Obesity   . Symptomatic anemia 06/02/2016  . Low vitamin D level 01/03/2016  . Depression 07/09/2015    Past Surgical History:  Procedure Laterality Date  . arm surgery    . CESAREAN SECTION  N/A 06/23/2016   Procedure: CESAREAN SECTION;  Surgeon: [place] BingPickens, Charlie, MD;  [facility];  Service: Obstetrics;  Laterality: N/A;  . CHOLECYSTECTOMY    . DILATION AND CURETTAGE OF UTERUS  2008  . MR WRIST RIGHT     sx to nerves  . NERVE, TENDON AND ARTERY REPAIR Right 06/18/2014   Procedure: NERVE, TENDON AND ARTERY REPAIR;  Surgeon: Bradly BienenstockFred Ortmann, MD;  Location: [facility];  Service: Orthopedics;  Laterality: Right;  . TONSILLECTOMY AND ADENOIDECTOMY    . VAGINAL DELIVERY  06/23/2016   Procedure: VAGINAL DELIVERY;  Surgeon: [place] BingPickens, Charlie, MD;  [facility];  Service: Obstetrics;;    OB History    Gravida Para Term Preterm AB Living   7 5 4 1 2 4    SAB TAB Ectopic Multiple Live Births   2 0 0 1 4       Home Medications    Prior to Admission medications   Medication Sig Start Date End Date Taking? Authorizing Provider  acetaminophen (TYLENOL) 500 MG tablet Take 1,000-1,500 mg by mouth every 6 (six) hours as needed for moderate pain.    [provider]  amoxicillin (AMOXIL) 500 MG capsule Take 1 capsule (500 mg total) by mouth 3 (three) times daily. 01/07/17   Maxwell CaulLayden, Lindsey A, PA-C  ferrous gluconate (FERGON) 324 MG tablet Take 1 tablet (324  mg total) by mouth 2 (two) times daily with a meal. Patient not taking: Reported on 12/17/2016 06/30/16   [place] BingPickens, Charlie, MD  hydrOXYzine (ATARAX/VISTARIL) 25 MG tablet Take 1 tablet (25 mg total) every 8 (eight) hours as needed by mouth for anxiety. 11/23/16   Shaune PollackIsaacs, Cameron, MD  ibuprofen (ADVIL,MOTRIN) 200 MG tablet Take 400 mg by mouth every 6 (six) hours as needed for moderate pain.    [provider]  naproxen (NAPROSYN) 500 MG tablet Take 1 tablet (500 mg total) by mouth 2 (two) times daily. 12/17/16   Rolland PorterJames, Mark, MD  ondansetron (ZOFRAN) 8 MG tablet Take 1 tablet (8 mg total) by mouth every 8 (eight) hours as needed for nausea or vomiting. Patient not taking: Reported on 12/17/2016 11/09/16    Sudie GrumblingAmyot, Ann Berry, NP    Family History Family History  Problem Relation Age of Onset  . Heart disease Maternal Grandfather   . Diabetes Maternal Grandfather   . Heart disease Father   . Anesthesia problems Neg Hx     Social History Social History   Tobacco Use  . Smoking status: Current Every Day Smoker    Packs/day: 0.25    Types: Cigarettes    Last attempt to quit: 12/05/2015    Years since quitting: 1.0  . Smokeless tobacco: Never Used  Substance Use Topics  . Alcohol use: No    Alcohol/week: 0.0 oz    Comment: Occassionally  . Drug use: No     Allergies   Patient has no known allergies.   Review of Systems Review of Systems  HENT: Positive for dental problem and facial swelling.   Respiratory: Negative for cough and shortness of breath.   Cardiovascular: Negative for chest pain.  Gastrointestinal: Negative for abdominal pain, nausea and vomiting.  Genitourinary: Negative for dysuria and hematuria.  Neurological: Negative for headaches.     Physical Exam Updated Vital Signs BP 130/87 (BP Location: Left Arm)   Pulse 78   Temp 98.4 F (36.9 C) (Oral)   Resp 18   Ht 5\' 9"  (1.753 m)   Wt 122.5 kg (270 lb)   LMP 12/17/2016   SpO2 100%   BMI 39.87 kg/m   Physical Exam  Constitutional: Jacqueline Clarke appears well-developed and well-nourished.  HENT:  Head: Normocephalic and atraumatic.    Right Ear: Tympanic membrane normal.  Left Ear: Tympanic membrane normal.  Mouth/Throat: Oropharynx is clear and moist and mucous membranes are normal.    Minimal right lower facial swelling.  No overlying warmth or erythema noted.  No neck swelling.  Tenderness palpation to the lower right side of the face.  Multiple dental caries.  Posterior is clear.  No evidence of uvula deviation.  No trismus.  No evidence of peritonsillar abscess.  Eyes: Conjunctivae and EOM are normal. Right eye exhibits no discharge. Left eye exhibits no discharge. No scleral icterus.    Pulmonary/Chest: Effort normal.  Neurological: Jacqueline Clarke is alert.  Skin: Skin is warm and dry.  Psychiatric: Jacqueline Clarke has a normal mood and affect. Her speech is normal and behavior is normal.  Nursing note and vitals reviewed.    ED Treatments / Results  Labs (all labs ordered are listed, but only abnormal results are displayed) Labs Reviewed - No data to display  EKG  EKG Interpretation None       Radiology No results found.  Procedures Procedures (including critical care time)  Medications Ordered in ED Medications  HYDROcodone-acetaminophen (NORCO/VICODIN) 5-325 MG per tablet 2  tablet (2 tablets Oral Given 01/07/17 0911)     Initial Impression / Assessment and Plan / ED Course  I have reviewed the triage vital signs and the nursing notes.  Pertinent labs & imaging results that were available during my care of the patient were reviewed by me and considered in my medical decision making (see chart for details).     31 year old female who presents for evaluation of 3 days of dental pain and right lower facial swelling.  Patient reports that Jacqueline Clarke has been taking ibuprofen with no improvement.  Patient reports yesterday went to give blood had a temperature of 99.0 but otherwise no fevers.  No difficulty swallowing, tolerating p.o. Patient is afebrile, non-toxic appearing, sitting comfortably on examination table. Vital signs reviewed and stable.  Physical exam shows some minimal right lower facial swelling.  No overlying.  Patient has dental carry in the back right lower molar.  There is some very minimal gingival erythema but no palpable fluctuance.  No identifiable dental abscess.  Concern for periapical dental abscess.  History/physical exam not concerning for Ludwig angina or peritonsillar abscess.  Discussed with patient regarding I&D of possible dental abscess, including dental block.  Patient does not wish to have I&D done at this time.  Patient is tolerating p.o. in the  department.  Vitals are stable.  Given findings, I feel that this is reasonable.  Discussed risk first benefits of declining I&D and patient declines.  Patient exhibits full medical decision-making capacity.  We will plan to start patient on antibiotic therapy and give outpatient dental resource guide for further evaluation. Patient with no known drug allergies. Patient had ample opportunity for questions and discussion. All patient's questions were answered with full understanding. Strict return precautions discussed. Patient expresses understanding and agreement to plan.   RN informed me that upon attempted discharge, patient was now requesting I&D and dental block.  I went to discuss with patient and  we discussed her options.  I again offered I&D and dental block here in the department and Jacqueline Clarke declined.  Encourage patient to take the antibiotic as directed and follow-up with dentist.  Patient stable for discharge at this time. Patient had ample opportunity for questions and discussion. All patient's questions were answered with full understanding.   Final Clinical Impressions(s) / ED Diagnoses   Final diagnoses:  Dental abscess  Pain, dental    ED Discharge Orders        Ordered    amoxicillin (AMOXIL) 500 MG capsule  3 times daily     01/07/17 0911       Maxwell Caul, PA-C 01/07/17 1653    Gerhard Munch, MD 01/09/17 2127

## 2017-03-30 ENCOUNTER — Encounter (HOSPITAL_COMMUNITY): Payer: Self-pay

## 2017-03-30 ENCOUNTER — Emergency Department (HOSPITAL_COMMUNITY): Payer: Medicaid Other

## 2017-03-30 ENCOUNTER — Emergency Department (HOSPITAL_COMMUNITY)
Admission: EM | Admit: 2017-03-30 | Discharge: 2017-03-30 | Disposition: A | Discharge status: Home / Self Care | Payer: Medicaid Other | Attending: Emergency Medicine | Admitting: Emergency Medicine

## 2017-03-30 ENCOUNTER — Emergency Department (HOSPITAL_BASED_OUTPATIENT_CLINIC_OR_DEPARTMENT_OTHER)
Admit: 2017-03-30 | Discharge: 2017-03-30 | Disposition: A | Discharge status: Home / Self Care | Payer: Medicaid Other | Attending: Emergency Medicine | Admitting: Emergency Medicine

## 2017-03-30 DIAGNOSIS — R600 Localized edema: Secondary | ICD-10-CM | POA: Insufficient documentation

## 2017-03-30 DIAGNOSIS — M79609 Pain in unspecified limb: Secondary | ICD-10-CM | POA: Diagnosis not present

## 2017-03-30 DIAGNOSIS — F17210 Nicotine dependence, cigarettes, uncomplicated: Secondary | ICD-10-CM | POA: Insufficient documentation

## 2017-03-30 DIAGNOSIS — R200 Anesthesia of skin: Secondary | ICD-10-CM | POA: Diagnosis present

## 2017-03-30 DIAGNOSIS — M5416 Radiculopathy, lumbar region: Secondary | ICD-10-CM | POA: Diagnosis not present

## 2017-03-30 LAB — COMPREHENSIVE METABOLIC PANEL
ALK PHOS: 67 U/L (ref 38–126)
ALT: 10 U/L — AB (ref 14–54)
AST: 16 U/L (ref 15–41)
Albumin: 3 g/dL — ABNORMAL LOW (ref 3.5–5.0)
Anion gap: 5 (ref 5–15)
BUN: 15 mg/dL (ref 6–20)
CHLORIDE: 111 mmol/L (ref 101–111)
CO2: 24 mmol/L (ref 22–32)
CREATININE: 0.73 mg/dL (ref 0.44–1.00)
Calcium: 8.3 mg/dL — ABNORMAL LOW (ref 8.9–10.3)
GFR calc Af Amer: >60 mL/min (ref >60)
GFR calc non Af Amer: >60 mL/min (ref >60)
Glucose, Bld: 95 mg/dL (ref 65–99)
Potassium: 4.2 mmol/L (ref 3.5–5.1)
Sodium: 140 mmol/L (ref 135–145)
Total Bilirubin: 0.4 mg/dL (ref 0.3–1.2)
Total Protein: 6 g/dL — ABNORMAL LOW (ref 6.5–8.1)

## 2017-03-30 LAB — I-STAT CHEM 8, ED
BUN: 14 mg/dL (ref 6–20)
CHLORIDE: 107 mmol/L (ref 101–111)
CREATININE: 0.6 mg/dL (ref 0.44–1.00)
Calcium, Ion: 1.11 mmol/L — ABNORMAL LOW (ref 1.15–1.40)
Glucose, Bld: 90 mg/dL (ref 65–99)
HEMATOCRIT: 32 % — AB (ref 36.0–46.0)
HEMOGLOBIN: 10.9 g/dL — AB (ref 12.0–15.0)
POTASSIUM: 3.8 mmol/L (ref 3.5–5.1)
Sodium: 141 mmol/L (ref 135–145)
TCO2: 22 mmol/L (ref 22–32)

## 2017-03-30 LAB — TROPONIN I

## 2017-03-30 LAB — I-STAT BETA HCG BLOOD, ED (MC, WL, AP ONLY)

## 2017-03-30 LAB — BRAIN NATRIURETIC PEPTIDE: B Natriuretic Peptide: 33.4 pg/mL (ref 0.0–100.0)

## 2017-03-30 MED ORDER — MORPHINE SULFATE (PF) 4 MG/ML IV SOLN
4.0000 mg | Freq: Once | INTRAVENOUS | Status: AC
  Administered 2017-03-30: 4 mg via INTRAVENOUS
  Filled 2017-03-30: qty 1

## 2017-03-30 MED ORDER — PREDNISONE 50 MG PO TABS: ORAL_TABLET | ORAL | 0 refills | Status: DC

## 2017-03-30 MED ORDER — SODIUM CHLORIDE 0.9 % IJ SOLN
INTRAMUSCULAR | Status: AC
  Administered 2017-03-30: 06:00:00
  Filled 2017-03-30: qty 50

## 2017-03-30 MED ORDER — ONDANSETRON HCL 4 MG/2ML IJ SOLN
4.0000 mg | Freq: Once | INTRAMUSCULAR | Status: AC
  Administered 2017-03-30: 4 mg via INTRAVENOUS
  Filled 2017-03-30: qty 2

## 2017-03-30 MED ORDER — IOPAMIDOL (ISOVUE-370) INJECTION 76%
100.0000 mL | Freq: Once | INTRAVENOUS | Status: AC | PRN
  Administered 2017-03-30: 100 mL via INTRAVENOUS

## 2017-03-30 MED ORDER — METHOCARBAMOL 500 MG PO TABS: 1000.0000 mg | ORAL_TABLET | Freq: Four times a day (QID) | ORAL | 0 refills | Status: DC | PRN

## 2017-03-30 MED ORDER — ACETAMINOPHEN 325 MG PO TABS
650.0000 mg | ORAL_TABLET | Freq: Once | ORAL | Status: AC
  Administered 2017-03-30: 650 mg via ORAL
  Filled 2017-03-30: qty 2

## 2017-03-30 MED ORDER — IOPAMIDOL (ISOVUE-370) INJECTION 76%
INTRAVENOUS | Status: AC
  Filled 2017-03-30: qty 100

## 2017-03-30 NOTE — ED Provider Notes (Signed)
32 year old female received at signout from [place] pending venous duplex. Per her history:  "Jacqueline Clarke is a 32 y.o. female complaining of left lower extremity pain and swelling worsening over the course of the last 2 weeks.  No known trauma.  She does not have a history of DVT/PE no recent immobilizations or surgeries.  She does not take any hormonal birth control but she does smoke cigarettes.  She is also reporting shortness of breath and dyspnea on exertion starting 1 week ago she denies chest pain, fever, cough, hemoptysis but she endorses palpitations.  She also endorses low back pain and the pain radiates down from the buttock to the hip and down the leg."  Physical Exam  BP 123/89 (BP Location: Left Arm)   Pulse 77   Temp 99.2 F (37.3 C) (Oral)   Resp 20   LMP 02/22/2017 Comment: negative beta HCG 03/30/17  SpO2 99%   Physical Exam A&O x3  ED Course/Procedures     Procedures  MDM  32 year old female presenting with left leg pain and swelling for 2 weeks.  Left lower extremity venous duplex is negative for DVT, superficial thrombosis, or Baker's cyst.  Tylenol offered for pain control prior to discharge.  Will discharge to home with prednisone and Robaxin per PA Pisciotta's plan with plan to follow up with her PCP.  Strict return precautions given.  She is hemodynamically stable and in no acute distress.  The patient is safe for discharge home at this time.       Barkley BoardsMcDonald, Andy Allende A, PA-C 03/30/17 0903    Paula LibraMolpus, John, MD 04/05/17 [phone]

## 2017-03-30 NOTE — ED Provider Notes (Signed)
[facility]-EMERGENCY DEPT Provider Note   CSN: [id number] Arrival date & time: 03/30/17  0131     History   Chief Complaint Chief Complaint  Patient presents with  . leg numbness    HPI   Blood pressure (!) 129/102, pulse 87, temperature 99.2 F (37.3 C), temperature source Oral, resp. rate 20, last menstrual period 02/22/2017, SpO2 99 %, not currently breastfeeding.  Jacqueline Clarke is a 32 y.o. female complaining of left lower extremity pain and swelling worsening over the course of the last 2 weeks.  No known trauma.  She does not have a history of DVT/PE no recent immobilizations or surgeries.  She does not take any hormonal birth control but she does smoke cigarettes.  She is also reporting shortness of breath and dyspnea on exertion starting 1 week ago she denies chest pain, fever, cough, hemoptysis but she endorses palpitations.  She also endorses low back pain and the pain radiates down from the buttock to the hip and down the leg.  Past Medical History:  Diagnosis Date  . Anemia   . Anxiety   . Asthma   . Blood type, Rh negative   . Cholelithiasis   . Chronic back pain    scoliosis- on percocet  . Depression   . Headache(784.0)   . Hyperemesis complicating pregnancy, antepartum 2013  . 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 Obesity   . Pregnancy induced hypertension   . Scoliosis     Patient Active Problem List   Diagnosis Date Noted  . 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 Depression, postpartum 07/09/2016  . Adjustment insomnia 07/09/2016  . History of ileus 07/06/2016  . Pyelonephritis 07/03/2016  . Gestational hypertension 06/30/2016  . Blood type, Rh negative   . Obesity   . Symptomatic anemia 06/02/2016  . Low vitamin D level 01/03/2016  . Depression 07/09/2015    Past Surgical History:  Procedure Laterality Date  . arm surgery    . CESAREAN SECTION N/A 06/23/2016   Procedure: CESAREAN SECTION;  Surgeon: Eldorado Bing, MD;  Location: [facility] BIRTHING SUITES;  Service: Obstetrics;  Laterality: N/A;  . CHOLECYSTECTOMY    . DILATION AND CURETTAGE OF UTERUS  2008  . MR WRIST RIGHT     sx to nerves  . NERVE, TENDON AND ARTERY REPAIR Right 06/18/2014   Procedure: NERVE, TENDON AND ARTERY REPAIR;  Surgeon: Bradly Bienenstock, MD;  Location: [facility] OR;  Service: Orthopedics;  Laterality: Right;  . TONSILLECTOMY AND ADENOIDECTOMY    . VAGINAL DELIVERY  06/23/2016   Procedure: VAGINAL DELIVERY;  Surgeon: Coos Bing, MD;  Location: [facility] BIRTHING SUITES;  Service: Obstetrics;;    OB History    Gravida Para Term Preterm AB Living   7 5 4 1 2 4    SAB TAB Ectopic Multiple Live Births   2 0 0 1 4       Home Medications    Prior to Admission medications   Medication Sig Start Date End Date Taking? Authorizing Provider  acetaminophen (TYLENOL) 500 MG tablet Take 1,000-1,500 mg by mouth every 6 (six) hours as needed for moderate pain.   Yes [provider]  ibuprofen (ADVIL,MOTRIN) 200 MG tablet Take 400 mg by mouth every 6 (six) hours as needed for moderate pain.   Yes [provider]  hydrOXYzine (ATARAX/VISTARIL) 25 MG tablet Take 1 tablet (25 mg total) every 8 (eight) hours as needed by mouth for anxiety. Patient not  taking: Reported on 03/30/2017 11/23/16   Shaune PollackIsaacs, Cameron, MD  methocarbamol (ROBAXIN) 500 MG tablet Take 2 tablets (1,000 mg total) by mouth 4 (four) times daily as needed (Pain). 03/30/17   Trishia Cuthrell, Joni ReiningNicole, PA-C  naproxen (NAPROSYN) 500 MG tablet Take 1 tablet (500 mg total) by mouth 2 (two) times daily. Patient not taking: Reported on 03/30/2017 12/17/16   Rolland PorterJames, Mark, MD  ondansetron (ZOFRAN) 8 MG tablet Take 1 tablet (8 mg total) by mouth every 8 (eight) hours as needed for nausea or vomiting. Patient not taking: Reported on 12/17/2016 11/09/16   Sudie GrumblingAmyot, Ann Berry, NP  predniSONE (DELTASONE) 50 MG tablet Take 1 tablet daily with breakfast 03/30/17   Naylah Cork, Joni ReiningNicole, PA-C    Family  History Family History  Problem Relation Age of Onset  . Heart disease Maternal Grandfather   . Diabetes Maternal Grandfather   . Heart disease Father   . Anesthesia problems Neg Hx     Social History Social History   Tobacco Use  . Smoking status: Current Every Day Smoker    Packs/day: 0.25    Types: Cigarettes    Last attempt to quit: 12/05/2015    Years since quitting: 1.3  . Smokeless tobacco: Never Used  Substance Use Topics  . Alcohol use: No    Alcohol/week: 0.0 oz    Comment: Occassionally  . Drug use: No     Allergies   Patient has no known allergies.   Review of Systems Review of Systems  A complete review of systems was obtained and all systems are negative except as noted in the HPI and PMH.   Physical Exam Updated Vital Signs BP 123/89 (BP Location: Left Arm)   Pulse 77   Temp 99.2 F (37.3 C) (Oral)   Resp 20   LMP 02/22/2017 Comment: negative beta HCG 03/30/17  SpO2 99%   Physical Exam  Constitutional: She is oriented to person, place, and time. She appears well-developed and well-nourished. No distress.  Obese  HENT:  Head: Normocephalic and atraumatic.  Mouth/Throat: Oropharynx is clear and moist.  Eyes: Conjunctivae and EOM are normal. Pupils are equal, round, and reactive to light.  Neck: Normal range of motion.  Cardiovascular: Normal rate, regular rhythm and intact distal pulses.  Pulmonary/Chest: Effort normal and breath sounds normal.  Abdominal: Soft. There is no tenderness.  Musculoskeletal: She exhibits edema and tenderness.  1+pitting edema to LLE  + varicosities   Neurological: She is alert and oriented to person, place, and time.  No point tenderness to percussion of lumbar spinal processes.  No TTP or paraspinal muscular spasm. Strength is 5 out of 5 to bilateral lower extremities at hip and knee; extensor hallucis longus 5 out of 5. Ankle strength 5 out of 5, no clonus, neurovascularly intact. No saddle anaesthesia. Patellar  reflexes are 2+ bilaterally.    Straight leg raise is positive on the ipsilateral side   Skin: She is not diaphoretic.  Psychiatric: She has a normal mood and affect.  Nursing note and vitals reviewed.    ED Treatments / Results  Labs (all labs ordered are listed, but only abnormal results are displayed) Labs Reviewed  COMPREHENSIVE METABOLIC PANEL - Abnormal; Notable for the following components:      Result Value   Calcium 8.3 (*)    Total Protein 6.0 (*)    Albumin 3.0 (*)    ALT 10 (*)    All other components within normal limits  I-STAT CHEM 8, ED -  Abnormal; Notable for the following components:   Calcium, Ion 1.11 (*)    Hemoglobin 10.9 (*)    HCT 32.0 (*)    All other components within normal limits  TROPONIN I  BRAIN NATRIURETIC PEPTIDE  I-STAT BETA HCG BLOOD, ED ([facility], [facility], [facility] ONLY)    EKG  EKG Interpretation  Date/Time:  Monday March 30 2017 05:20:42 EDT Ventricular Rate:  75 PR Interval:    QRS Duration: 92 QT Interval:  389 QTC Calculation: 435 R Axis:   39 Text Interpretation:  Sinus rhythm Normal ECG No significant change was found Confirmed by Paula Libra ([id number]) on 03/30/2017 5:23:59 AM       Radiology Ct Angio Chest Pe W And/or Wo Contrast  Result Date: 03/30/2017 CLINICAL DATA:  Acute onset of shortness of breath with exertion. Chronic left leg numbness, left foot pain and swelling. EXAM: CT ANGIOGRAPHY CHEST WITH CONTRAST TECHNIQUE: Multidetector CT imaging of the chest was performed using the standard protocol during bolus administration of intravenous contrast. Multiplanar CT image reconstructions and MIPs were obtained to evaluate the vascular anatomy. CONTRAST:  ISOVUE-370 IOPAMIDOL (ISOVUE-370) INJECTION 76% COMPARISON:  CT of the thoracic spine performed 10/13/2016 FINDINGS: Cardiovascular:  There is no evidence of pulmonary embolus. The heart is normal in size. The thoracic aorta is unremarkable. The great vessels are within normal  limits. Mediastinum/Nodes: The mediastinum is unremarkable. No mediastinal lymphadenopathy is seen. No pericardial effusion is identified. The visualized portions of the thyroid gland are unremarkable. No axillary lymphadenopathy is seen. Lungs/Pleura: Mild bibasilar atelectasis is noted. No pleural effusion or pneumothorax is seen. No masses are identified. Upper Abdomen: The visualized portions of the liver and spleen are unremarkable. Musculoskeletal: No acute osseous abnormalities are identified. The visualized musculature is unremarkable in appearance. Review of the MIP images confirms the above findings. IMPRESSION: 1. No evidence of pulmonary embolus. 2. Mild bibasilar atelectasis.  Lungs otherwise clear. Electronically Signed   By: Roanna Raider M.D.   On: 03/30/2017 06:34    Procedures Procedures (including critical care time)  Medications Ordered in ED Medications  sodium chloride 0.9 % injection (not administered)  iopamidol (ISOVUE-370) 76 % injection (not administered)  morphine 4 MG/ML injection 4 mg (4 mg Intravenous Given 03/30/17 0512)  ondansetron (ZOFRAN) injection 4 mg (4 mg Intravenous Given 03/30/17 0511)  iopamidol (ISOVUE-370) 76 % injection 100 mL (100 mLs Intravenous Contrast Given 03/30/17 0609)     Initial Impression / Assessment and Plan / ED Course  I have reviewed the triage vital signs and the nursing notes.  Pertinent labs & imaging results that were available during my care of the patient were reviewed by me and considered in my medical decision making (see chart for details).     Vitals:   03/30/17 0152 03/30/17 0526  BP: (!) 129/102 123/89  Pulse: 87 77  Resp: 20 20  Temp: 99.2 F (37.3 C)   TempSrc: Oral   SpO2: 99% 99%    Medications  sodium chloride 0.9 % injection (not administered)  iopamidol (ISOVUE-370) 76 % injection (not administered)  morphine 4 MG/ML injection 4 mg (4 mg Intravenous Given 03/30/17 0512)  ondansetron (ZOFRAN) injection  4 mg (4 mg Intravenous Given 03/30/17 0511)  iopamidol (ISOVUE-370) 76 % injection 100 mL (100 mLs Intravenous Contrast Given 03/30/17 0609)    Jacqueline Clarke is 32 y.o. female presenting with left lower extremity edema onset 2 weeks ago then she developed shortness of breath 1 week ago.  Concern for possible DVT/PE, vital signs are reassuring.  She is morbidly obese with varicosities and she is a smoker.  No prior history of DVT/PE.  Unfortunately, I cannot obtain a venous duplex at this hour.  Will obtain CTA basic blood work, EKG.   EKG with no significant findings.  Blood work reassuring.  Troponin and BNP negative, CTA negative.  Case signed out to PA McDonald at shift change: Plan is to follow-up venous duplex for lower extremity edema, this is negative I would recommend treatment for lumbar radiculopathy.  Final Clinical Impressions(s) / ED Diagnoses   Final diagnoses:  Lumbar radiculopathy    ED Discharge Orders        Ordered    predniSONE (DELTASONE) 50 MG tablet     03/30/17 0641    methocarbamol (ROBAXIN) 500 MG tablet  4 times daily PRN     03/30/17 0641       Brigitt Mcclish, Mardella Layman 03/30/17 0703    Molpus, Jonny Ruiz, MD 03/30/17 1610

## 2017-03-30 NOTE — ED Notes (Signed)
Patient transported to CT 

## 2017-03-30 NOTE — ED Triage Notes (Signed)
Pt complains of left leg numbness , left foot pain and swelling since she had her Csection 9 months ago, she said it went away for awhile but started back this week Pt also complains of being short of breath on exertion

## 2017-03-30 NOTE — ED Notes (Signed)
Pt given sprite 

## 2017-03-30 NOTE — Discharge Instructions (Signed)

## 2017-03-30 NOTE — Progress Notes (Signed)
Left lower extremity venous duplex completed. No evidence of DVT, superficial thrombosis, or Baker's cyst. Toma DeitersVirginia Sahej Schrieber, RVS 03/30/2017 8:37 AM

## 2017-06-01 ENCOUNTER — Emergency Department (HOSPITAL_COMMUNITY)
Admission: EM | Admit: 2017-06-01 | Discharge: 2017-06-01 | Discharge status: Against Medical Advice | Payer: Medicaid Other

## 2017-07-11 ENCOUNTER — Emergency Department (HOSPITAL_COMMUNITY)
Admission: EM | Admit: 2017-07-11 | Discharge: 2017-07-11 | Disposition: A | Discharge status: Home / Self Care | Payer: Medicaid Other | Attending: Emergency Medicine | Admitting: Emergency Medicine

## 2017-07-11 ENCOUNTER — Encounter (HOSPITAL_COMMUNITY): Payer: Self-pay | Admitting: Emergency Medicine

## 2017-07-11 ENCOUNTER — Other Ambulatory Visit: Payer: Self-pay

## 2017-07-11 DIAGNOSIS — Z79899 Other long term (current) drug therapy: Secondary | ICD-10-CM | POA: Insufficient documentation

## 2017-07-11 DIAGNOSIS — B356 Tinea cruris: Secondary | ICD-10-CM | POA: Diagnosis not present

## 2017-07-11 DIAGNOSIS — M791 Myalgia, unspecified site: Secondary | ICD-10-CM | POA: Insufficient documentation

## 2017-07-11 DIAGNOSIS — R52 Pain, unspecified: Secondary | ICD-10-CM

## 2017-07-11 DIAGNOSIS — F17210 Nicotine dependence, cigarettes, uncomplicated: Secondary | ICD-10-CM | POA: Insufficient documentation

## 2017-07-11 LAB — CBC WITH DIFFERENTIAL/PLATELET
BASOS PCT: 0 %
Basophils Absolute: 0 10*3/uL (ref 0.0–0.1)
EOS ABS: 0 10*3/uL (ref 0.0–0.7)
Eosinophils Relative: 0 %
HCT: 37.4 % (ref 36.0–46.0)
HEMOGLOBIN: 11.8 g/dL — AB (ref 12.0–15.0)
LYMPHS ABS: 1.3 10*3/uL (ref 0.7–4.0)
Lymphocytes Relative: 16 %
MCH: 26 pg (ref 26.0–34.0)
MCHC: 31.6 g/dL (ref 30.0–36.0)
MCV: 82.6 fL (ref 78.0–100.0)
Monocytes Absolute: 0.5 10*3/uL (ref 0.1–1.0)
Monocytes Relative: 6 %
NEUTROS PCT: 78 %
Neutro Abs: 6.1 10*3/uL (ref 1.7–7.7)
Platelets: 263 10*3/uL (ref 150–400)
RBC: 4.53 MIL/uL (ref 3.87–5.11)
RDW: 15.1 % (ref 11.5–15.5)
WBC: 7.8 10*3/uL (ref 4.0–10.5)

## 2017-07-11 LAB — COMPREHENSIVE METABOLIC PANEL
ALBUMIN: 3.5 g/dL (ref 3.5–5.0)
ALK PHOS: 55 U/L (ref 38–126)
ALT: 13 U/L — AB (ref 14–54)
AST: 15 U/L (ref 15–41)
Anion gap: 7 (ref 5–15)
BUN: 10 mg/dL (ref 6–20)
CALCIUM: 8.9 mg/dL (ref 8.9–10.3)
CO2: 26 mmol/L (ref 22–32)
CREATININE: 0.86 mg/dL (ref 0.44–1.00)
Chloride: 109 mmol/L (ref 101–111)
GFR calc Af Amer: >60 mL/min (ref >60)
GFR calc non Af Amer: >60 mL/min (ref >60)
GLUCOSE: 104 mg/dL — AB (ref 65–99)
Potassium: 3.5 mmol/L (ref 3.5–5.1)
Sodium: 142 mmol/L (ref 135–145)
Total Bilirubin: 0.3 mg/dL (ref 0.3–1.2)
Total Protein: 6.3 g/dL — ABNORMAL LOW (ref 6.5–8.1)

## 2017-07-11 LAB — URINALYSIS, ROUTINE W REFLEX MICROSCOPIC
BILIRUBIN URINE: NEGATIVE
GLUCOSE, UA: NEGATIVE mg/dL
Hgb urine dipstick: NEGATIVE
KETONES UR: NEGATIVE mg/dL
Leukocytes, UA: NEGATIVE
Nitrite: NEGATIVE
PH: 6 (ref 5.0–8.0)
Protein, ur: NEGATIVE mg/dL
Specific Gravity, Urine: 1.018 (ref 1.005–1.030)

## 2017-07-11 LAB — POC URINE PREG, ED: Preg Test, Ur: NEGATIVE

## 2017-07-11 MED ORDER — CLOTRIMAZOLE 1 % EX CREA: TOPICAL_CREAM | CUTANEOUS | 0 refills | Status: DC

## 2017-07-11 MED ORDER — KETOROLAC TROMETHAMINE 15 MG/ML IJ SOLN
15.0000 mg | Freq: Once | INTRAMUSCULAR | Status: AC
  Administered 2017-07-11: 15 mg via INTRAVENOUS
  Filled 2017-07-11: qty 1

## 2017-07-11 NOTE — Discharge Instructions (Addendum)
You may take tylenol and motrin for your body aches. You should use the clotrimazole cream twice daily to treat the ringworm on your chest.  Please follow up with your primary care provider within 5-7 days for re-evaluation of your symptoms. If you do not have a primary care provider, information for a healthcare clinic has been provided for you to make arrangements for follow up care. Please return to the emergency department for any new or worsening symptoms.

## 2017-07-11 NOTE — ED Provider Notes (Addendum)
[facility]-EMERGENCY DEPT Provider Note   CSN: [id number] Arrival date & time: 07/11/17  0443     History   Chief Complaint Chief Complaint  Patient presents with  . Generalized Body Aches    HPI Jacqueline Clarke is a 32 y.o. female.  HPI   Pt is a 32 y/o female with a h/o anemia, anxiety, cholelithiasis, chronic back pain, depression who presents to the ED today to be evaluated for generalized body aches that have been ongoing for the last month but has worsened over the last 2 weeks. Currently pain located to right back and radiates down the RLE. She states she has had this same pain in the past chronically and it intermittently flares up. Also c/o left shoulder pain to anterior and lateral shoulder, worse with movement and palpation. Also c/o right trapezius pain and right shoulder pain that is also worse with movement and palpation. Denies fevers. Has been nauseated for 1 week. No vomiting. No abd pain, urinary, or vaginal sxs. No recent falls or trauma. Also has diffuse headache, not worst headache of life. Gradual in onset. No vision changes, numbness, weakness, lightheadedness, or dizziness. Has not tried any interventions. Works at a [employer].  She also reports rhinorrhea, but denies nasal congestion, sore throat, cough, chest pain, shortness of breath. No rashes, no known tick bites.  Denies leg pain/swelling, hemoptysis, recent surgery/trauma, recent long travel, hormone use, personal hx of cancer, or hx of DVT/PE.   Past Medical History:  Diagnosis Date  . Anemia   . Anxiety   . Asthma   . Blood type, Rh negative   . Cholelithiasis   . Chronic back pain    scoliosis- on percocet  . Depression   . Headache(784.0)   . Hyperemesis complicating pregnancy, antepartum 2013  . 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 Obesity   . Pregnancy induced hypertension   . Scoliosis     Patient Active Problem List   Diagnosis Date Noted    . 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 Depression, postpartum 07/09/2016  . Adjustment insomnia 07/09/2016  . History of ileus 07/06/2016  . Pyelonephritis 07/03/2016  . Gestational hypertension 06/30/2016  . Blood type, Rh negative   . Obesity   . Symptomatic anemia 06/02/2016  . Low vitamin D level 01/03/2016  . Depression 07/09/2015    Past Surgical History:  Procedure Laterality Date  . arm surgery    . CESAREAN SECTION N/A 06/23/2016   Procedure: CESAREAN SECTION;  Surgeon: [place] BingPickens, Charlie, MD;  [facility];  Service: Obstetrics;  Laterality: N/A;  . CHOLECYSTECTOMY    . DILATION AND CURETTAGE OF UTERUS  2008  . MR WRIST RIGHT     sx to nerves  . NERVE, TENDON AND ARTERY REPAIR Right 06/18/2014   Procedure: NERVE, TENDON AND ARTERY REPAIR;  Surgeon: Bradly BienenstockFred Ortmann, MD;  Location: [facility] OR;  Service: Orthopedics;  Laterality: Right;  . TONSILLECTOMY AND ADENOIDECTOMY    . VAGINAL DELIVERY  06/23/2016   Procedure: VAGINAL DELIVERY;  Surgeon: [place] BingPickens, Charlie, MD;  [facility];  Service: Obstetrics;;     OB History    Gravida  7   Para  5   Term  4   Preterm  1   AB  2   Living  4     SAB  2   TAB  0   Ectopic  0   Multiple  1  Live Births  4            Home Medications    Prior to Admission medications   Medication Sig Start Date End Date Taking? Authorizing Provider  acetaminophen (TYLENOL) 500 MG tablet Take 1,000-1,500 mg by mouth every 6 (six) hours as needed for moderate pain.   Yes [provider]  ibuprofen (ADVIL,MOTRIN) 200 MG tablet Take 400 mg by mouth every 6 (six) hours as needed for moderate pain.   Yes [provider]  clotrimazole (LOTRIMIN) 1 % cream Apply to affected area 2 times daily for 2 weeks 07/11/17   Hawk Mones S, PA-C  hydrOXYzine (ATARAX/VISTARIL) 25 MG tablet Take 1 tablet (25 mg total) every 8 (eight) hours as needed by mouth for anxiety. Patient not taking: Reported on  03/30/2017 11/23/16   Shaune Pollack, MD  methocarbamol (ROBAXIN) 500 MG tablet Take 2 tablets (1,000 mg total) by mouth 4 (four) times daily as needed (Pain). Patient not taking: Reported on 07/11/2017 03/30/17   Pisciotta, Joni Reining, PA-C  naproxen (NAPROSYN) 500 MG tablet Take 1 tablet (500 mg total) by mouth 2 (two) times daily. Patient not taking: Reported on 03/30/2017 12/17/16   Rolland Porter, MD  ondansetron (ZOFRAN) 8 MG tablet Take 1 tablet (8 mg total) by mouth every 8 (eight) hours as needed for nausea or vomiting. Patient not taking: Reported on 12/17/2016 11/09/16   Sudie Grumbling, NP  predniSONE (DELTASONE) 50 MG tablet Take 1 tablet daily with breakfast Patient not taking: Reported on 07/11/2017 03/30/17   Pisciotta, Joni Reining, PA-C    Family History Family History  Problem Relation Age of Onset  . Heart disease Maternal Grandfather   . Diabetes Maternal Grandfather   . Heart disease Father   . Anesthesia problems Neg Hx     Social History Social History   Tobacco Use  . Smoking status: Current Every Day Smoker    Packs/day: 0.25    Types: Cigarettes    Last attempt to quit: 12/05/2015    Years since quitting: 1.6  . Smokeless tobacco: Never Used  Substance Use Topics  . Alcohol use: No    Alcohol/week: 0.0 oz    Comment: Occassionally  . Drug use: No     Allergies   Patient has no known allergies.   Review of Systems Review of Systems  Constitutional: Negative for fever.  HENT: Positive for rhinorrhea. Negative for congestion and sore throat.   Eyes: Negative for visual disturbance.  Respiratory: Negative for cough and shortness of breath.   Cardiovascular: Negative for chest pain and leg swelling.  Gastrointestinal: Positive for nausea. Negative for abdominal pain, blood in stool, constipation, diarrhea and vomiting.  Genitourinary: Negative for dysuria, flank pain, frequency, hematuria, pelvic pain, urgency, vaginal bleeding and vaginal discharge.    Musculoskeletal: Positive for myalgias.  Skin: Negative for rash and wound.       Small area of redness to left breast  Neurological: Positive for headaches. Negative for dizziness, speech difficulty, weakness, light-headedness and numbness.   Physical Exam Updated Vital Signs BP 120/84   Pulse 84   Temp 98.8 F (37.1 C) (Oral)   Resp 15   Ht 5\' 11"  (1.803 m)   Wt 123.4 kg (272 lb)   LMP 05/29/2017   SpO2 98%   BMI 37.94 kg/m   Physical Exam  Constitutional: She is oriented to person, place, and time. She appears well-developed and well-nourished. No distress.  HENT:  Head: Normocephalic and atraumatic.  Mouth/Throat: Oropharynx is clear and moist.  Eyes: Pupils are equal, round, and reactive to light. Conjunctivae and EOM are normal. No scleral icterus.  No horizontal or vertical nystagmus  Neck: Normal range of motion. Neck supple.  No nuchal rigidity  Cardiovascular: Normal rate, regular rhythm, normal heart sounds and intact distal pulses.  No murmur heard. Pulmonary/Chest: Effort normal and breath sounds normal. No respiratory distress. She has no wheezes.  Abdominal: Soft. Bowel sounds are normal. She exhibits no distension. There is no tenderness. There is no guarding.  No cva ttp  Musculoskeletal: She exhibits no edema.  Mild TTP to the right thoracic paraspinous muscles. TTP to the left clavicle which reproduces her pain. TTP to the right posterior shoulder and right trapezius muscle.  Neurological: She is alert and oriented to person, place, and time.  Mental Status:  Alert, thought content appropriate, able to give a coherent history. Speech fluent without evidence of aphasia. Able to follow 2 step commands without difficulty.  Cranial Nerves:  II:  pupils equal, round, reactive to light III,IV, VI: ptosis not present, extra-ocular motions intact bilaterally  V,VII: smile symmetric, facial light touch sensation equal VIII: hearing grossly normal to voice  X:  uvula elevates symmetrically  XI: bilateral shoulder shrug symmetric and strong XII: midline tongue extension without fassiculations Motor:  Normal tone. 5/5 strength of BUE and BLE major muscle groups including strong and equal grip strength and dorsiflexion/plantar flexion Sensory: light touch normal in all extremities. CV: 2+ radial and DP/PT pulses  Skin: Skin is warm and dry. Capillary refill takes less than 2 seconds. No rash noted.  Ringworm noted to left breast  Psychiatric: She has a normal mood and affect.  Nursing note and vitals reviewed.  ED Treatments / Results  Labs (all labs ordered are listed, but only abnormal results are displayed) Labs Reviewed  CBC WITH DIFFERENTIAL/PLATELET - Abnormal; Notable for the following components:      Result Value   Hemoglobin 11.8 (*)    All other components within normal limits  COMPREHENSIVE METABOLIC PANEL - Abnormal; Notable for the following components:   Glucose, Bld 104 (*)    Total Protein 6.3 (*)    ALT 13 (*)    All other components within normal limits  URINALYSIS, ROUTINE W REFLEX MICROSCOPIC  POC URINE PREG, ED    EKG None  Radiology No results found.  Procedures Procedures (including critical care time)  Medications Ordered in ED Medications  ketorolac (TORADOL) 15 MG/ML injection 15 mg (15 mg Intravenous Given 07/11/17 0737)     Initial Impression / Assessment and Plan / ED Course  I have reviewed the triage vital signs and the nursing notes.  Pertinent labs & imaging results that were available during my care of the patient were reviewed by me and considered in my medical decision making (see chart for details).  Reevaluated patient.  She is sleeping comfortably in the room.  Heart rate in the 80s.  BP 120 systolic, satting 100% on room air. NAD. Communicated results and plan. Advised to f/u with pcp and to return if worse. Pt voices understanding and feels comfortable with the plan. All questions  answered. Requests a work note.  Final Clinical Impressions(s) / ED Diagnoses   Final diagnoses:  Body aches  Tinea cruris   Patient presenting with multiple complaints. VSS and she is afebrile. She is nontoxic appearing and is in no acute distress. Has left clavicle TTP, no deformity. Also has right posterior  shoulder and trapezius TTP. Also has right thoracic paraspinous muscle TTP. No recent injuries or falls. No CVA TTP. Cardiac and pulmonary exam are benign. Abdominal exam is benign. Neurologic exam is without focal neurologic deficits. No rashes noted. No meningeal signs. Do not suspect tick bourne illness. Pt does have ringworm to left breast, will give rx to tx this. Her labwork is benign.  She has no leukocytosis.  Her liver function and kidney function with normal normal limits.  Her electrolytes are within normal limits.  UA is without evidence of UTI.  Urine pregnancy test is negative.  Do not suspect that patient has any underlying emergent illness that would require admission to the hospital or further intervention at this time.  Advised to follow-up with her PCP and to return to the ER she is any new or worsening symptoms.  ED Discharge Orders        Ordered    clotrimazole (LOTRIMIN) 1 % cream     07/11/17 0817       Karrie Meres, PA-C 07/11/17 0858    Karrie Meres, PA-C 07/11/17 0859    Paula Libra, MD 07/11/17 2243

## 2017-07-11 NOTE — ED Triage Notes (Signed)
Patient states she is having pain all over that wakes her up in her sleep. Patient states this started two weeks ago. Patient states she has been tired and dizzy.

## 2017-07-29 ENCOUNTER — Emergency Department (HOSPITAL_COMMUNITY)
Admission: EM | Admit: 2017-07-29 | Discharge: 2017-07-29 | Disposition: A | Discharge status: Home / Self Care | Payer: Medicaid Other | Attending: Emergency Medicine | Admitting: Emergency Medicine

## 2017-07-29 ENCOUNTER — Other Ambulatory Visit: Payer: Self-pay

## 2017-07-29 ENCOUNTER — Encounter (HOSPITAL_COMMUNITY): Payer: Self-pay | Admitting: Emergency Medicine

## 2017-07-29 DIAGNOSIS — Z79899 Other long term (current) drug therapy: Secondary | ICD-10-CM | POA: Diagnosis not present

## 2017-07-29 DIAGNOSIS — F17210 Nicotine dependence, cigarettes, uncomplicated: Secondary | ICD-10-CM | POA: Diagnosis not present

## 2017-07-29 DIAGNOSIS — J45909 Unspecified asthma, uncomplicated: Secondary | ICD-10-CM | POA: Diagnosis not present

## 2017-07-29 DIAGNOSIS — I10 Essential (primary) hypertension: Secondary | ICD-10-CM | POA: Diagnosis not present

## 2017-07-29 DIAGNOSIS — K0889 Other specified disorders of teeth and supporting structures: Secondary | ICD-10-CM | POA: Diagnosis not present

## 2017-07-29 MED ORDER — TRAMADOL HCL 50 MG PO TABS: 50.0000 mg | ORAL_TABLET | Freq: Four times a day (QID) | ORAL | 0 refills | Status: DC | PRN

## 2017-07-29 MED ORDER — AMOXICILLIN 500 MG PO CAPS: 500.0000 mg | ORAL_CAPSULE | Freq: Three times a day (TID) | ORAL | 0 refills | Status: DC

## 2017-07-29 NOTE — ED Provider Notes (Signed)
[facility] EMERGENCY DEPARTMENT Provider Note   CSN: [id number] Arrival date & time: 07/29/17  0242     History   Chief Complaint Chief Complaint  Patient presents with  . Dental Pain    HPI Jacqueline Clarke is a 32 y.o. female.  Patient presents to the ER for evaluation of toothache.  Patient reports that she started having pain in the left lower jaw 3 days ago.  Pain is progressively worsened and she has now noticed some swelling.  She has not had any fever.  It hurts to open and close her mouth but she has no trouble swallowing.     Past Medical History:  Diagnosis Date  . Anemia   . Anxiety   . Asthma   . Blood type, Rh negative   . Cholelithiasis   . Chronic back pain    scoliosis- on percocet  . Depression   . Headache(784.0)   . Hyperemesis complicating pregnancy, antepartum 2013  . 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 Obesity   . Pregnancy induced hypertension   . Scoliosis     Patient Active Problem List   Diagnosis Date Noted  . 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 Depression, postpartum 07/09/2016  . Adjustment insomnia 07/09/2016  . History of ileus 07/06/2016  . Pyelonephritis 07/03/2016  . Gestational hypertension 06/30/2016  . Blood type, Rh negative   . Obesity   . Symptomatic anemia 06/02/2016  . Low vitamin D level 01/03/2016  . Depression 07/09/2015    Past Surgical History:  Procedure Laterality Date  . arm surgery    . CESAREAN SECTION N/A 06/23/2016   Procedure: CESAREAN SECTION;  Surgeon: Atl[place] Bing, MD;  Location: [facility]THING SU[facility];  Service: Obstetrics;  Laterality: N/A;  . CHOLECYSTECTOMY    . DILATION AND CURETTAGE OF UTERUS  2008  . MR WRIST RIGHT     sx to nerves  . NERVE, TENDON AND ARTERY REPAIR Right 06/18/2014   Procedure: NERVE, TENDON AND ARTERY REPAIR;  Surgeon: Bradly Bienenstock, MD;  Location: [facility] OR;  Service: Orthopedics;  Laterality: Right;  . TONSILLECTOMY AND ADENOIDECTOMY    .  VAGINAL DELIVERY  06/23/2016   Procedure: VAGINAL DELIVERY;  Surgeon: Ing[place] Bing, MD;  Location: [facility] BIRTHING SUIT[facility];  Service: Obstetrics;;     OB History    Gravida  7   Para  5   Term  4   Preterm  1   AB  2   Living  4     SAB  2   TAB  0   Ectopic  0   Multiple  1   Live Births  4            Home Medications    Prior to Admission medications   Medication Sig Start Date End Date Taking? Authorizing Provider  acetaminophen (TYLENOL) 500 MG tablet Take 1,000-1,500 mg by mouth every 6 (six) hours as needed for moderate pain.    [provider]  amoxicillin (AMOXIL) 500 MG capsule Take 1 capsule (500 mg total) by mouth 3 (three) times daily. 07/29/17   Gilda Crease, MD  clotrimazole (LOTRIMIN) 1 % cream Apply to affected area 2 times daily for 2 weeks 07/11/17   Couture, Cortni S, PA-C  hydrOXYzine (ATARAX/VISTARIL) 25 MG tablet Take 1 tablet (25 mg total) every 8 (eight) hours as needed by mouth for anxiety. Patient not taking: Reported on 03/30/2017 11/23/16   Shaune Pollack,  MD  ibuprofen (ADVIL,MOTRIN) 200 MG tablet Take 400 mg by mouth every 6 (six) hours as needed for moderate pain.    [provider]  methocarbamol (ROBAXIN) 500 MG tablet Take 2 tablets (1,000 mg total) by mouth 4 (four) times daily as needed (Pain). Patient not taking: Reported on 07/11/2017 03/30/17   Pisciotta, Joni ReiningNicole, PA-C  naproxen (NAPROSYN) 500 MG tablet Take 1 tablet (500 mg total) by mouth 2 (two) times daily. Patient not taking: Reported on 03/30/2017 12/17/16   Rolland PorterJames, Mark, MD  ondansetron (ZOFRAN) 8 MG tablet Take 1 tablet (8 mg total) by mouth every 8 (eight) hours as needed for nausea or vomiting. Patient not taking: Reported on 12/17/2016 11/09/16   Sudie GrumblingAmyot, Ann Berry, NP  predniSONE (DELTASONE) 50 MG tablet Take 1 tablet daily with breakfast Patient not taking: Reported on 07/11/2017 03/30/17   Pisciotta, Joni ReiningNicole, PA-C  traMADol (ULTRAM) 50 MG tablet Take  1 tablet (50 mg total) by mouth every 6 (six) hours as needed. 07/29/17   Gilda CreasePollina, Patricia Fargo J, MD    Family History Family History  Problem Relation Age of Onset  . Heart disease Maternal Grandfather   . Diabetes Maternal Grandfather   . Heart disease Father   . Anesthesia problems Neg Hx     Social History Social History   Tobacco Use  . Smoking status: Current Every Day Smoker    Packs/day: 0.25    Types: Cigarettes    Last attempt to quit: 12/05/2015    Years since quitting: 1.6  . Smokeless tobacco: Never Used  Substance Use Topics  . Alcohol use: No    Alcohol/week: 0.0 oz    Comment: Occassionally  . Drug use: No     Allergies   Patient has no known allergies.   Review of Systems Review of Systems  HENT: Positive for dental problem.   All other systems reviewed and are negative.    Physical Exam Updated Vital Signs BP 123/80   Pulse 97   Temp 99.1 F (37.3 C) (Oral)   Resp 18   Ht 5\' 10"  (1.778 m)   Wt 125.6 kg (277 lb)   LMP 07/22/2017   BMI 39.75 kg/m   Physical Exam  Constitutional: She is oriented to person, place, and time. She appears well-developed and well-nourished.  HENT:  Head: Atraumatic.  Mouth/Throat: Dental caries present.  Slight swelling left submandibular region, no mass palpable, no fluctuance  Neck: Neck supple.  Cardiovascular: Normal rate.  Pulmonary/Chest: Breath sounds normal.  Neurological: She is alert and oriented to person, place, and time.  Skin: Skin is warm and dry. No rash noted. No erythema.     ED Treatments / Results  Labs (all labs ordered are listed, but only abnormal results are displayed) Labs Reviewed - No data to display  EKG None  Radiology No results found.  Procedures Procedures (including critical care time)  Medications Ordered in ED Medications - No data to display   Initial Impression / Assessment and Plan / ED Course  I have reviewed the triage vital signs and the nursing  notes.  Pertinent labs & imaging results that were available during my care of the patient were reviewed by me and considered in my medical decision making (see chart for details).     Patient with pain, slight swelling of the left submandibular region and associated with toothache.  Possible early dental abscess, no concern for Ludwig's angina or deep or soft tissue infection.  Will treat with  antibiotics, analgesia.  She reports that she does have a dentist.  Final Clinical Impressions(s) / ED Diagnoses   Final diagnoses:  Pain, dental    ED Discharge Orders        Ordered    amoxicillin (AMOXIL) 500 MG capsule  3 times daily     07/29/17 0352    traMADol (ULTRAM) 50 MG tablet  Every 6 hours PRN     07/29/17 0352       Gilda Crease, MD 07/29/17 [phone]

## 2017-07-29 NOTE — ED Triage Notes (Signed)
Pt reports dental pain to L lower side for 3-4 days. Pt has minimal swelling.

## 2017-07-29 NOTE — ED Notes (Signed)
ED Provider at bedside. 

## 2017-07-29 NOTE — ED Notes (Signed)
Discharge instructions/prescriptions reviewed with patient. Pt denied any further needs/requests. Pt denied cutting off wristband. Pt verbalized understanding of instructions, signature pad unavailable.

## 2017-08-18 ENCOUNTER — Inpatient Hospital Stay (HOSPITAL_COMMUNITY)
Admission: AD | Admit: 2017-08-18 | Discharge: 2017-08-18 | Disposition: A | Discharge status: Home / Self Care | Payer: Medicaid Other | Source: Ambulatory Visit | Attending: Obstetrics and Gynecology | Admitting: Obstetrics and Gynecology

## 2017-08-18 ENCOUNTER — Other Ambulatory Visit: Payer: Self-pay

## 2017-08-18 ENCOUNTER — Encounter (HOSPITAL_COMMUNITY): Payer: Self-pay | Admitting: *Deleted

## 2017-08-18 ENCOUNTER — Inpatient Hospital Stay (HOSPITAL_COMMUNITY): Payer: Medicaid Other

## 2017-08-18 DIAGNOSIS — R109 Unspecified abdominal pain: Secondary | ICD-10-CM | POA: Diagnosis not present

## 2017-08-18 DIAGNOSIS — Z3A01 Less than 8 weeks gestation of pregnancy: Secondary | ICD-10-CM | POA: Diagnosis not present

## 2017-08-18 DIAGNOSIS — O210 Mild hyperemesis gravidarum: Secondary | ICD-10-CM | POA: Insufficient documentation

## 2017-08-18 DIAGNOSIS — O219 Vomiting of pregnancy, unspecified: Secondary | ICD-10-CM | POA: Diagnosis not present

## 2017-08-18 DIAGNOSIS — N760 Acute vaginitis: Secondary | ICD-10-CM

## 2017-08-18 DIAGNOSIS — B9689 Other specified bacterial agents as the cause of diseases classified elsewhere: Secondary | ICD-10-CM | POA: Diagnosis not present

## 2017-08-18 DIAGNOSIS — O23591 Infection of other part of genital tract in pregnancy, first trimester: Secondary | ICD-10-CM | POA: Diagnosis not present

## 2017-08-18 DIAGNOSIS — Z3491 Encounter for supervision of normal pregnancy, unspecified, first trimester: Secondary | ICD-10-CM

## 2017-08-18 DIAGNOSIS — Z87891 Personal history of nicotine dependence: Secondary | ICD-10-CM | POA: Insufficient documentation

## 2017-08-18 DIAGNOSIS — O26891 Other specified pregnancy related conditions, first trimester: Secondary | ICD-10-CM | POA: Diagnosis not present

## 2017-08-18 LAB — CBC
HCT: 37.8 % (ref 36.0–46.0)
Hemoglobin: 12.4 g/dL (ref 12.0–15.0)
MCH: 27.1 pg (ref 26.0–34.0)
MCHC: 32.8 g/dL (ref 30.0–36.0)
MCV: 82.5 fL (ref 78.0–100.0)
Platelets: 249 10*3/uL (ref 150–400)
RBC: 4.58 MIL/uL (ref 3.87–5.11)
RDW: 16.6 % — ABNORMAL HIGH (ref 11.5–15.5)
WBC: 6.3 10*3/uL (ref 4.0–10.5)

## 2017-08-18 LAB — HCG, QUANTITATIVE, PREGNANCY: hCG, Beta Chain, Quant, S: 126910 m[IU]/mL — ABNORMAL HIGH (ref <5)

## 2017-08-18 LAB — WET PREP, GENITAL
Sperm: NONE SEEN
Trich, Wet Prep: NONE SEEN
Yeast Wet Prep HPF POC: NONE SEEN

## 2017-08-18 LAB — URINALYSIS, ROUTINE W REFLEX MICROSCOPIC
Bilirubin Urine: NEGATIVE
Glucose, UA: NEGATIVE mg/dL
Hgb urine dipstick: NEGATIVE
Ketones, ur: NEGATIVE mg/dL
Nitrite: NEGATIVE
Protein, ur: NEGATIVE mg/dL
Specific Gravity, Urine: 1.028 (ref 1.005–1.030)
WBC, UA: >50 WBC/hpf — ABNORMAL HIGH (ref 0–5)
pH: 5 (ref 5.0–8.0)

## 2017-08-18 LAB — POCT PREGNANCY, URINE: PREG TEST UR: POSITIVE — AB

## 2017-08-18 LAB — HIV ANTIBODY (ROUTINE TESTING W REFLEX): HIV Screen 4th Generation wRfx: NONREACTIVE

## 2017-08-18 MED ORDER — PROMETHAZINE HCL 25 MG/ML IJ SOLN
25.0000 mg | Freq: Once | INTRAMUSCULAR | Status: AC
  Administered 2017-08-18: 25 mg via INTRAVENOUS
  Filled 2017-08-18: qty 1

## 2017-08-18 MED ORDER — PROMETHAZINE HCL 25 MG PO TABS: 25.0000 mg | ORAL_TABLET | Freq: Four times a day (QID) | ORAL | 1 refills | Status: DC | PRN

## 2017-08-18 MED ORDER — LACTATED RINGERS IV BOLUS
1000.0000 mL | Freq: Once | INTRAVENOUS | Status: AC
  Administered 2017-08-18: 1000 mL via INTRAVENOUS

## 2017-08-18 MED ORDER — METRONIDAZOLE 500 MG PO TABS: 500.0000 mg | ORAL_TABLET | Freq: Two times a day (BID) | ORAL | 0 refills | Status: DC

## 2017-08-18 NOTE — MAU Provider Note (Addendum)
Chief Complaint: Emesis and Abdominal Pain   First Provider Initiated Contact with Patient 08/18/17 0342      SUBJECTIVE HPI: Jacqueline Clarke is a 32 y.o. [id number] at 4021w6d by LMP who presents to maternity admissions reporting N/V and abdominal pain. She reports abdominal pain has been occurring for the past week, describes pain as lower abdominal cramping and aching in her umbilicus. She rates pain 7/10- has taken Tylenol and Ibuprofen for abdominal pain with no relief. She reports N/V has been occurring for the past week as well but has worsened over the past 3 days. She reports not being able to keep anything down including water over the past 3 days. She denies taking a HPT and recently had a negative UPT when she went to the ED last month. She denies vaginal bleeding, vaginal itching/burning, urinary symptoms, h/a, dizziness, or fever/chills.    Past Medical History:  Diagnosis Date  . Anemia   . Anxiety   . Asthma   . Blood type, Rh negative   . Cholelithiasis   . Chronic back pain    scoliosis- on percocet  . Depression   . Headache(784.0)   . Hyperemesis complicating pregnancy, antepartum 2013  . 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 Obesity   . Pregnancy induced hypertension   . Scoliosis    Past Surgical History:  Procedure Laterality Date  . arm surgery    . CESAREAN SECTION N/A 06/23/2016   Procedure: CESAREAN SECTION;  Surgeon: [place] BingPickens, Charlie, MD;  [facility];  Service: Obstetrics;  Laterality: N/A;  . CHOLECYSTECTOMY    . DILATION AND CURETTAGE OF UTERUS  2008  . MR WRIST RIGHT     sx to nerves  . NERVE, TENDON AND ARTERY REPAIR Right 06/18/2014   Procedure: NERVE, TENDON AND ARTERY REPAIR;  Surgeon: Bradly BienenstockFred Ortmann, MD;  Location: [facility];  Service: Orthopedics;  Laterality: Right;  . TONSILLECTOMY AND ADENOIDECTOMY    . VAGINAL DELIVERY  06/23/2016   Procedure: VAGINAL DELIVERY;  Surgeon: [place] BingPickens, Charlie, MD;  [facility];  Service: Obstetrics;;   Social History   Socioeconomic History  . Marital status: Single    Spouse name: Not on file  . Number of children: Not on file  . Years of education: Not on file  . Highest education level: Not on file  Occupational History  . Not on file  Social Needs  . Financial resource strain: Not on file  . Food insecurity:    Worry: Not on file    Inability: Not on file  . Transportation needs:    Medical: Not on file    Non-medical: Not on file  Tobacco Use  . Smoking status: Former Smoker    Packs/day: 0.25    Types: Cigarettes    Last attempt to quit: 08/02/2017    Years since quitting: 0.0  . Smokeless tobacco: Never Used  Substance and Sexual Activity  . Alcohol use: No    Alcohol/week: 0.0 oz    Comment: Occassionally  . Drug use: No  . Sexual activity: Not Currently    Partners: Male    Birth control/protection: None  Lifestyle  . Physical activity:    Days per week: Not on file    Minutes per session: Not on file  . Stress: Not on file  Relationships  . Social connections:    Talks on phone: Not on file    Gets together: Not on  file    Attends religious service: Not on file    Active member of club or organization: Not on file    Attends meetings of clubs or organizations: Not on file    Relationship status: Not on file  . Intimate partner violence:    Fear of current or ex partner: Not on file    Emotionally abused: Not on file    Physically abused: Not on file    Forced sexual activity: Not on file  Other Topics Concern  . Not on file  Social History Narrative   ** Merged History Encounter **       ** Merged History Encounter **       No current facility-administered medications on file prior to encounter.    Current Outpatient Medications on File Prior to Encounter  Medication Sig Dispense Refill  . acetaminophen (TYLENOL) 500 MG tablet Take 1,000-1,500 mg by mouth every 6 (six) hours as needed for moderate pain.    Marland Kitchen  amoxicillin (AMOXIL) 500 MG capsule Take 1 capsule (500 mg total) by mouth 3 (three) times daily. 30 capsule 0  . clotrimazole (LOTRIMIN) 1 % cream Apply to affected area 2 times daily for 2 weeks 15 g 0  . hydrOXYzine (ATARAX/VISTARIL) 25 MG tablet Take 1 tablet (25 mg total) every 8 (eight) hours as needed by mouth for anxiety. (Patient not taking: Reported on 03/30/2017) 20 tablet 0  . ibuprofen (ADVIL,MOTRIN) 200 MG tablet Take 400 mg by mouth every 6 (six) hours as needed for moderate pain.    . methocarbamol (ROBAXIN) 500 MG tablet Take 2 tablets (1,000 mg total) by mouth 4 (four) times daily as needed (Pain). (Patient not taking: Reported on 07/11/2017) 20 tablet 0  . naproxen (NAPROSYN) 500 MG tablet Take 1 tablet (500 mg total) by mouth 2 (two) times daily. (Patient not taking: Reported on 03/30/2017) 30 tablet 0  . ondansetron (ZOFRAN) 8 MG tablet Take 1 tablet (8 mg total) by mouth every 8 (eight) hours as needed for nausea or vomiting. (Patient not taking: Reported on 12/17/2016) 12 tablet 0  . predniSONE (DELTASONE) 50 MG tablet Take 1 tablet daily with breakfast (Patient not taking: Reported on 07/11/2017) 5 tablet 0  . traMADol (ULTRAM) 50 MG tablet Take 1 tablet (50 mg total) by mouth every 6 (six) hours as needed. 15 tablet 0   No Known Allergies  ROS:  Review of Systems  Constitutional: Negative.   Respiratory: Negative.   Cardiovascular: Negative.   Gastrointestinal: Positive for abdominal pain, nausea and vomiting. Negative for constipation and diarrhea.  Genitourinary: Negative.    I have reviewed patient's Past Medical Hx, Surgical Hx, Family Hx, Social Hx, medications and allergies.   Physical Exam   Patient Vitals for the past 24 hrs:  BP Temp Temp src Pulse Resp SpO2 Weight  08/18/17 0525 (!) 109/59 - - 63 16 - -  08/18/17 0305 108/80 98.7 F (37.1 C) Oral 67 18 100 % 277 lb (125.6 kg)   Constitutional: Well-developed, obese female in no acute distress.   Cardiovascular: normal rate Respiratory: normal effort GI: Abd soft, non-tender. Pos BS x 4 MS: Extremities nontender, no edema, normal ROM Neurologic: Alert and oriented x 4.  GU: Neg CVAT.  PELVIC EXAM: blind swabs obtained, moderate white thin discharge with foul odor noted at vaginal introitus Bimanual exam: Cervix 0/long/high, firm, anterior, neg CMT, uterus nontender, nonenlarged, adnexa without tenderness, enlargement, or mass  LAB RESULTS Results for orders placed or  performed during the hospital encounter of 08/18/17 (from the past 24 hour(s))  Urinalysis, Routine w reflex microscopic     Status: Abnormal   Collection Time: 08/18/17  3:23 AM  Result Value Ref Range   Color, Urine YELLOW YELLOW   APPearance HAZY (A) CLEAR   Specific Gravity, Urine 1.028 1.005 - 1.030   pH 5.0 5.0 - 8.0   Glucose, UA NEGATIVE NEGATIVE mg/dL   Hgb urine dipstick NEGATIVE NEGATIVE   Bilirubin Urine NEGATIVE NEGATIVE   Ketones, ur NEGATIVE NEGATIVE mg/dL   Protein, ur NEGATIVE NEGATIVE mg/dL   Nitrite NEGATIVE NEGATIVE   Leukocytes, UA LARGE (A) NEGATIVE   RBC / HPF 0-5 0 - 5 RBC/hpf   WBC, UA >50 (H) 0 - 5 WBC/hpf   Bacteria, UA RARE (A) NONE SEEN   Squamous Epithelial / LPF 11-20 0 - 5   Mucus PRESENT   Pregnancy, urine POC     Status: Abnormal   Collection Time: 08/18/17  3:28 AM  Result Value Ref Range   Preg Test, Ur POSITIVE (A) NEGATIVE  Wet prep, genital     Status: Abnormal   Collection Time: 08/18/17  3:50 AM  Result Value Ref Range   Yeast Wet Prep HPF POC NONE SEEN NONE SEEN   Trich, Wet Prep NONE SEEN NONE SEEN   Clue Cells Wet Prep HPF POC PRESENT (A) NONE SEEN   WBC, Wet Prep HPF POC MANY (A) NONE SEEN   Sperm NONE SEEN   hCG, quantitative, pregnancy     Status: Abnormal   Collection Time: 08/18/17  4:09 AM  Result Value Ref Range   hCG, Beta Chain, Quant, S 126,910 (H) <5 mIU/mL  CBC     Status: Abnormal   Collection Time: 08/18/17  4:09 AM  Result Value Ref  Range   WBC 6.3 4.0 - 10.5 K/uL   RBC 4.58 3.87 - 5.11 MIL/uL   Hemoglobin 12.4 12.0 - 15.0 g/dL   HCT 16.1 09.6 - 04.5 %   MCV 82.5 78.0 - 100.0 fL   MCH 27.1 26.0 - 34.0 pg   MCHC 32.8 30.0 - 36.0 g/dL   RDW 40.9 (H) 81.1 - 91.4 %   Platelets 249 150 - 400 K/uL   IMAGING US Ob Comp Less 14 Wks  Result Date: 08/18/2017 CLINICAL DATA:  Pregnant patient in first-trimester pregnancy with abdominal pain. EXAM: OBSTETRIC <14 WK ULTRASOUND TECHNIQUE: Transabdominal ultrasound was performed for evaluation of the gestation as well as the maternal uterus and adnexal regions. COMPARISON:  None. FINDINGS: Intrauterine gestational sac: Single Yolk sac:  Visualized. Embryo:  Visualized. Cardiac Activity: Visualized. Heart Rate: 146 bpm CRL:   9.1 mm   6 w 6 d                  [place] EDC: 04/07/2018 Subchorionic hemorrhage:  None visualized. Maternal uterus/adnexae: Both ovaries are visualized and are normal. No adnexal mass. No pelvic free fluid. IMPRESSION: Single live intrauterine pregnancy estimated gestational age [redacted] weeks 6 days based on crown-rump length for [facility] 04/07/2018. No subchorionic hemorrhage. Normal sonographic appearance of the ovaries. Electronically Signed   By: Rubye Oaks M.D.   On: 08/18/2017 05:25    MAU Management/MDM: Orders Placed This Encounter  Procedures  . Wet prep, genital  . Culture, OB Urine  . US OB Comp Less 14 Wks  . Urinalysis, Routine w reflex microscopic  . hCG, quantitative, pregnancy  . CBC  . HIV antibody  .  Pregnancy, urine POC  . Insert peripheral IV  . Discharge patient Discharge disposition: 01-Home or Self Care; Discharge patient date: 08/18/2017   Wet prep- positive for clue cells, will treat for BV based on physical examination  Urine culture- pending  HIV and GC/C- pending  CBC- WNL   Meds ordered this encounter  Medications  . lactated ringers bolus 1,000 mL  . promethazine (PHENERGAN) injection 25 mg  . promethazine (PHENERGAN) 25 MG  tablet    Sig: Take 1 tablet (25 mg total) by mouth every 6 (six) hours as needed for nausea or vomiting.    Dispense:  30 tablet    Refill:  1    Order Specific Question:   Supervising Provider    Answer:   [place] Bing [id number]  . metroNIDAZOLE (FLAGYL) 500 MG tablet    Sig: Take 1 tablet (500 mg total) by mouth 2 (two) times daily.    Dispense:  14 tablet    Refill:  0    Order Specific Question:   Supervising Provider    Answer:   [place] Bing [[id number]]    Treatments in MAU included LR IV bolus with Phenergan 25mg  IV. Patient able to tolerate crackers and juice prior to discharge home.   Ultrasound and lab results discussed with patient. Rx for Phenergan and Flagyl sent to pharmacy. Pt discharged. Pt stable at time of discharge. Encouraged to make initial prenatal appointment to initiate care.  ASSESSMENT 1. Normal IUP (intrauterine pregnancy) on prenatal ultrasound, first trimester   2. Abdominal pain during pregnancy in first trimester   3. Nausea and vomiting during pregnancy   4. Bacterial vaginosis     PLAN Discharge home Make appointment to initiate care  Return to MAU as needed for emergencies  Rx for Phenergan and Flagyl    Allergies as of 08/18/2017   No Known Allergies     Medication List    STOP taking these medications   amoxicillin 500 MG capsule Commonly known as:  AMOXIL   clotrimazole 1 % cream Commonly known as:  LOTRIMIN   hydrOXYzine 25 MG tablet Commonly known as:  ATARAX/VISTARIL   ibuprofen 200 MG tablet Commonly known as:  ADVIL,MOTRIN   methocarbamol 500 MG tablet Commonly known as:  ROBAXIN   naproxen 500 MG tablet Commonly known as:  NAPROSYN   ondansetron 8 MG tablet Commonly known as:  ZOFRAN   predniSONE 50 MG tablet Commonly known as:  DELTASONE   traMADol 50 MG tablet Commonly known as:  ULTRAM     TAKE these medications   acetaminophen 500 MG tablet Commonly known as:  TYLENOL Take 1,000-1,500 mg by  mouth every 6 (six) hours as needed for moderate pain.   metroNIDAZOLE 500 MG tablet Commonly known as:  FLAGYL Take 1 tablet (500 mg total) by mouth 2 (two) times daily.   promethazine 25 MG tablet Commonly known as:  PHENERGAN Take 1 tablet (25 mg total) by mouth every 6 (six) hours as needed for nausea or vomiting.       Steward Drone  Certified Nurse-Midwife 08/18/2017  5:35 AM

## 2017-08-18 NOTE — MAU Note (Signed)
Pt has been vomiting every 45 minutes for the last 24 hours. Pt also having lower abdominal pain. Pt states last period middle of may

## 2017-08-19 LAB — CULTURE, OB URINE

## 2017-08-19 LAB — GC/CHLAMYDIA PROBE AMP (~~LOC~~) NOT AT ARMC
Chlamydia: NEGATIVE
Neisseria Gonorrhea: NEGATIVE

## 2017-09-02 ENCOUNTER — Inpatient Hospital Stay (HOSPITAL_COMMUNITY)
Admission: AD | Admit: 2017-09-02 | Discharge: 2017-09-02 | Disposition: A | Discharge status: Home / Self Care | Payer: Medicaid Other | Source: Ambulatory Visit | Attending: Family Medicine | Admitting: Family Medicine

## 2017-09-02 ENCOUNTER — Encounter (HOSPITAL_COMMUNITY): Payer: Self-pay

## 2017-09-02 DIAGNOSIS — Z3A09 9 weeks gestation of pregnancy: Secondary | ICD-10-CM | POA: Insufficient documentation

## 2017-09-02 DIAGNOSIS — O219 Vomiting of pregnancy, unspecified: Secondary | ICD-10-CM | POA: Diagnosis not present

## 2017-09-02 DIAGNOSIS — O210 Mild hyperemesis gravidarum: Secondary | ICD-10-CM | POA: Insufficient documentation

## 2017-09-02 DIAGNOSIS — Z87891 Personal history of nicotine dependence: Secondary | ICD-10-CM | POA: Insufficient documentation

## 2017-09-02 LAB — URINALYSIS, ROUTINE W REFLEX MICROSCOPIC
Bilirubin Urine: NEGATIVE
Glucose, UA: NEGATIVE mg/dL
Ketones, ur: NEGATIVE mg/dL
Nitrite: NEGATIVE
Protein, ur: NEGATIVE mg/dL
Specific Gravity, Urine: 1.026 (ref 1.005–1.030)
pH: 6 (ref 5.0–8.0)

## 2017-09-02 MED ORDER — METOCLOPRAMIDE HCL 5 MG/ML IJ SOLN: 10.0000 mg | Freq: Once | INTRAMUSCULAR | Status: DC

## 2017-09-02 MED ORDER — PROCHLORPERAZINE MALEATE 10 MG PO TABS: 10.0000 mg | ORAL_TABLET | Freq: Two times a day (BID) | ORAL | 0 refills | Status: DC | PRN

## 2017-09-02 MED ORDER — ACETAMINOPHEN 500 MG PO TABS: 1000.0000 mg | ORAL_TABLET | Freq: Four times a day (QID) | ORAL | 0 refills | Status: DC | PRN

## 2017-09-02 MED ORDER — PROCHLORPERAZINE EDISYLATE 10 MG/2ML IJ SOLN
10.0000 mg | Freq: Once | INTRAMUSCULAR | Status: AC
  Administered 2017-09-02: 10 mg via INTRAMUSCULAR
  Filled 2017-09-02: qty 2

## 2017-09-02 NOTE — MAU Note (Addendum)
PT SAYS  SHE WAS HERE ON 7-30-   SHE HAS PHENERGAN AT HOME.  BUT  HAS BEEN VOMITING X3 DAY.  LAST TIME TOOK PHENERGAN - 10PM.   CRAMPING STARTED YESTERDAY AM.   LAST SEX-  3 DAYS  AGO.

## 2017-09-02 NOTE — MAU Provider Note (Signed)
Chief Complaint: Emesis   First Provider Initiated Contact with Patient 09/02/17 0328      SUBJECTIVE HPI: Jacqueline Clarke is a 32 y.o. [id number] at 6740w0d by LMP who presents to maternity admissions reporting nausea and vomiting. She reports vomiting has been present for the past 3 days and worsening over the last 24 hours. She was seen in MAU on 7/30 for same complaint and started on Phenergan. She reports last taken her phenergan last night around 2230 with no relief. She reports she has vomited  "every hour for the past 24 hours". She denies lower abdominal cramping or vaginal bleeding. She denies vaginal itching/burning, urinary symptoms, h/a, dizziness or fever/chills.    Past Medical History:  Diagnosis Date  . Anemia   . Anxiety   . Asthma   . Blood type, Rh negative   . Cholelithiasis   . Chronic back pain    scoliosis- on percocet  . Depression   . Headache(784.0)   . Hyperemesis complicating pregnancy, antepartum 2013  . 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 Obesity   . Pregnancy induced hypertension   . Scoliosis    Past Surgical History:  Procedure Laterality Date  . arm surgery    . CESAREAN SECTION N/A 06/23/2016   Procedure: CESAREAN SECTION;  Surgeon: [place] BingPickens, Charlie, MD;  [facility];  Service: Obstetrics;  Laterality: N/A;  . CHOLECYSTECTOMY    . DILATION AND CURETTAGE OF UTERUS  2008  . MR WRIST RIGHT     sx to nerves  . NERVE, TENDON AND ARTERY REPAIR Right 06/18/2014   Procedure: NERVE, TENDON AND ARTERY REPAIR;  Surgeon: Bradly BienenstockFred Ortmann, MD;  Location: [facility] OR;  Service: Orthopedics;  Laterality: Right;  . TONSILLECTOMY AND ADENOIDECTOMY    . VAGINAL DELIVERY  06/23/2016   Procedure: VAGINAL DELIVERY;  Surgeon: [place] BingPickens, Charlie, MD;  [facility];  Service: Obstetrics;;   Social History   Socioeconomic History  . Marital status: Single    Spouse name: Not on file  . Number of children: Not on file  . Years of  education: Not on file  . Highest education level: Not on file  Occupational History  . Not on file  Social Needs  . Financial resource strain: Not on file  . Food insecurity:    Worry: Not on file    Inability: Not on file  . Transportation needs:    Medical: Not on file    Non-medical: Not on file  Tobacco Use  . Smoking status: Former Smoker    Packs/day: 0.25    Types: Cigarettes    Last attempt to quit: 08/02/2017    Years since quitting: 0.0  . Smokeless tobacco: Never Used  Substance and Sexual Activity  . Alcohol use: No    Alcohol/week: 0.0 standard drinks    Comment: Occassionally  . Drug use: No  . Sexual activity: Not Currently    Partners: Male    Birth control/protection: None  Lifestyle  . Physical activity:    Days per week: Not on file    Minutes per session: Not on file  . Stress: Not on file  Relationships  . Social connections:    Talks on phone: Not on file    Gets together: Not on file    Attends religious service: Not on file    Active member of club or organization: Not on file    Attends meetings of clubs or organizations:  Not on file    Relationship status: Not on file  . Intimate partner violence:    Fear of current or ex partner: Not on file    Emotionally abused: Not on file    Physically abused: Not on file    Forced sexual activity: Not on file  Other Topics Concern  . Not on file  Social History Narrative   ** Merged History Encounter **       ** Merged History Encounter **       No current facility-administered medications on file prior to encounter.    Current Outpatient Medications on File Prior to Encounter  Medication Sig Dispense Refill  . metroNIDAZOLE (FLAGYL) 500 MG tablet Take 1 tablet (500 mg total) by mouth 2 (two) times daily. 14 tablet 0  . promethazine (PHENERGAN) 25 MG tablet Take 1 tablet (25 mg total) by mouth every 6 (six) hours as needed for nausea or vomiting. 30 tablet 1   No Known Allergies  ROS:   Review of Systems  Constitutional: Negative.   Respiratory: Negative.   Cardiovascular: Negative.   Gastrointestinal: Positive for nausea and vomiting. Negative for abdominal pain, constipation and diarrhea.  Genitourinary: Negative.   Musculoskeletal: Negative.    I have reviewed patient's Past Medical Hx, Surgical Hx, Family Hx, Social Hx, medications and allergies.   Physical Exam   Patient Vitals for the past 24 hrs:  BP Temp Temp src Pulse Resp Height Weight  09/02/17 0502 (!) 115/52 - - 85 18 - -  09/02/17 0230 105/74 99.1 F (37.3 C) Oral 72 20 5\' 11"  (1.803 m) 124.6 kg   Constitutional: Well-developed, obese female in no acute distress.  Cardiovascular: normal rate Respiratory: normal effort GI: Abd soft, non-tender. Pos BS x 4 MS: Extremities nontender, no edema, normal ROM Neurologic: Alert and oriented x 4.  GU: Neg CVAT. PELVIC EXAM: deferred  LAB RESULTS Results for orders placed or performed during the hospital encounter of 09/02/17 (from the past 24 hour(s))  Urinalysis, Routine w reflex microscopic     Status: Abnormal   Collection Time: 09/02/17  2:44 AM  Result Value Ref Range   Color, Urine YELLOW YELLOW   APPearance HAZY (A) CLEAR   Specific Gravity, Urine 1.026 1.005 - 1.030   pH 6.0 5.0 - 8.0   Glucose, UA NEGATIVE NEGATIVE mg/dL   Hgb urine dipstick SMALL (A) NEGATIVE   Bilirubin Urine NEGATIVE NEGATIVE   Ketones, ur NEGATIVE NEGATIVE mg/dL   Protein, ur NEGATIVE NEGATIVE mg/dL   Nitrite NEGATIVE NEGATIVE   Leukocytes, UA MODERATE (A) NEGATIVE   RBC / HPF 0-5 0 - 5 RBC/hpf   WBC, UA 6-10 0 - 5 WBC/hpf   Bacteria, UA RARE (A) NONE SEEN   Squamous Epithelial / LPF 0-5 0 - 5   Mucus PRESENT    MAU Management/MDM: Orders Placed This Encounter  Procedures  . Urinalysis, Routine w reflex microscopic   UA- negative for ketones or protein   Meds ordered this encounter  Medications  . prochlorperazine (COMPAZINE) injection 10 mg  .  prochlorperazine (COMPAZINE) 10 MG tablet    Sig: Take 1 tablet (10 mg total) by mouth 2 (two) times daily as needed for nausea or vomiting.    Dispense:  10 tablet    Refill:  0    Order Specific Question:   Supervising Provider    Answer:   Levie Heritage [[id number]]   Treatments in MAU included 10mg  Compazine IM injection. Patient  able to tolerate crackers and gingerale prior to discharge home. Patient denies vomiting any while in MAU. Pt discharged. Pt stable at time of discharge. Rx for Compazine sent to pharmacy of choice to replace Phenergan. Educated and discussed reasons to return to MAU   ASSESSMENT 1. Nausea and vomiting during pregnancy   2. [redacted] weeks gestation of pregnancy     PLAN Discharge home Make appointment to be seen to initiate prenatal care  Return to MAU as needed for worsening symptoms  Rx for Compazine   Follow-up Information    [facility] Follow up.   Contact information: [address] [id number] [place]c [place] [id number] [phone]          Allergies as of 09/02/2017   No Known Allergies     Medication List    STOP taking these medications   metroNIDAZOLE 500 MG tablet Commonly known as:  FLAGYL   promethazine 25 MG tablet Commonly known as:  PHENERGAN     TAKE these medications   acetaminophen 500 MG tablet Commonly known as:  TYLENOL Take 2 tablets (1,000 mg total) by mouth every 6 (six) hours as needed for moderate pain. What changed:  how much to take   prochlorperazine 10 MG tablet Commonly known as:  COMPAZINE Take 1 tablet (10 mg total) by mouth 2 (two) times daily as needed for nausea or vomiting.      Steward DroneVeronica Gergory Biello  Certified Nurse-Midwife 09/02/2017  5:03 AM

## 2017-09-02 NOTE — Discharge Instructions (Signed)

## 2017-09-03 ENCOUNTER — Encounter (HOSPITAL_COMMUNITY): Payer: Self-pay | Admitting: *Deleted

## 2017-09-03 ENCOUNTER — Inpatient Hospital Stay (HOSPITAL_COMMUNITY)
Admission: AD | Admit: 2017-09-03 | Discharge: 2017-09-03 | Disposition: A | Discharge status: Home / Self Care | Payer: Medicaid Other | Source: Ambulatory Visit | Attending: Obstetrics & Gynecology | Admitting: Obstetrics & Gynecology

## 2017-09-03 ENCOUNTER — Other Ambulatory Visit: Payer: Self-pay

## 2017-09-03 ENCOUNTER — Inpatient Hospital Stay (HOSPITAL_COMMUNITY): Payer: Medicaid Other

## 2017-09-03 DIAGNOSIS — Z8614 Personal history of Methicillin resistant Staphylococcus aureus infection: Secondary | ICD-10-CM | POA: Diagnosis not present

## 2017-09-03 DIAGNOSIS — O209 Hemorrhage in early pregnancy, unspecified: Secondary | ICD-10-CM | POA: Insufficient documentation

## 2017-09-03 DIAGNOSIS — O208 Other hemorrhage in early pregnancy: Secondary | ICD-10-CM | POA: Diagnosis not present

## 2017-09-03 DIAGNOSIS — Z87891 Personal history of nicotine dependence: Secondary | ICD-10-CM | POA: Diagnosis not present

## 2017-09-03 DIAGNOSIS — O2311 Infections of bladder in pregnancy, first trimester: Secondary | ICD-10-CM | POA: Diagnosis not present

## 2017-09-03 DIAGNOSIS — Z3A09 9 weeks gestation of pregnancy: Secondary | ICD-10-CM | POA: Insufficient documentation

## 2017-09-03 DIAGNOSIS — N3001 Acute cystitis with hematuria: Secondary | ICD-10-CM

## 2017-09-03 LAB — GC/CHLAMYDIA PROBE AMP (~~LOC~~) NOT AT ARMC
Chlamydia: NEGATIVE
Neisseria Gonorrhea: NEGATIVE

## 2017-09-03 LAB — URINALYSIS, ROUTINE W REFLEX MICROSCOPIC
Bilirubin Urine: NEGATIVE
Glucose, UA: NEGATIVE mg/dL
Ketones, ur: NEGATIVE mg/dL
Nitrite: NEGATIVE
Protein, ur: NEGATIVE mg/dL
Specific Gravity, Urine: 1.029 (ref 1.005–1.030)
pH: 6 (ref 5.0–8.0)

## 2017-09-03 LAB — WET PREP, GENITAL
Sperm: NONE SEEN
Trich, Wet Prep: NONE SEEN
Yeast Wet Prep HPF POC: NONE SEEN

## 2017-09-03 MED ORDER — ACETAMINOPHEN 500 MG PO TABS
1000.0000 mg | ORAL_TABLET | Freq: Once | ORAL | Status: AC
  Administered 2017-09-03: 1000 mg via ORAL
  Filled 2017-09-03: qty 2

## 2017-09-03 MED ORDER — CEPHALEXIN 500 MG PO CAPS: 500.0000 mg | ORAL_CAPSULE | Freq: Four times a day (QID) | ORAL | 0 refills | Status: DC

## 2017-09-03 NOTE — MAU Provider Note (Signed)
Chief Complaint: Vaginal Bleeding   First Provider Initiated Contact with Patient 09/03/17 [phone]      SUBJECTIVE HPI: Jacqueline Clarke is a 32 y.o. [id number] at 6533w1d by LMP who presents to maternity admissions reporting vaginal bleeding and abdominal cramping. She reports abdominal cramping has been occurring for the past 4 days, describes the pain as lower abdominal cramping that is constant. Rates pain 5/10- has not taken any medication for abdominal pain. Reports the pain does not radiate to back and does not get worse with movement. She reports pain is associated with vaginal bleeding that started occurring this morning around 0100. She reports pass one quarter size clot and continued spotting when she wipes. She describes the bleeding as dark red and black blood. She denies recent IC. She denies vaginal itching/burning, urinary symptoms, h/a, dizziness, or fever/chills.  She reports continued nausea but denies vomiting since being seen yesterday and prescribed Compazine.   Past Medical History:  Diagnosis Date  . Anemia   . Anxiety   . Asthma   . Blood type, Rh negative   . Cholelithiasis   . Chronic back pain    scoliosis- on percocet  . Depression   . Headache(784.0)   . Hyperemesis complicating pregnancy, antepartum 2013  . 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 Obesity   . Pregnancy induced hypertension   . Scoliosis    Past Surgical History:  Procedure Laterality Date  . arm surgery    . CESAREAN SECTION N/A 06/23/2016   Procedure: CESAREAN SECTION;  Surgeon: [place] BingPickens, Charlie, MD;  [facility];  Service: Obstetrics;  Laterality: N/A;  . CHOLECYSTECTOMY    . DILATION AND CURETTAGE OF UTERUS  2008  . MR WRIST RIGHT     sx to nerves  . NERVE, TENDON AND ARTERY REPAIR Right 06/18/2014   Procedure: NERVE, TENDON AND ARTERY REPAIR;  Surgeon: Bradly BienenstockFred Ortmann, MD;  Location: [facility] OR;  Service: Orthopedics;  Laterality: Right;  . TONSILLECTOMY AND  ADENOIDECTOMY    . VAGINAL DELIVERY  06/23/2016   Procedure: VAGINAL DELIVERY;  Surgeon: [place] BingPickens, Charlie, MD;  [facility];  Service: Obstetrics;;   Social History   Socioeconomic History  . Marital status: Single    Spouse name: Not on file  . Number of children: Not on file  . Years of education: Not on file  . Highest education level: Not on file  Occupational History  . Not on file  Social Needs  . Financial resource strain: Not on file  . Food insecurity:    Worry: Not on file    Inability: Not on file  . Transportation needs:    Medical: Not on file    Non-medical: Not on file  Tobacco Use  . Smoking status: Former Smoker    Packs/day: 0.25    Types: Cigarettes    Last attempt to quit: 08/02/2017    Years since quitting: 0.0  . Smokeless tobacco: Never Used  Substance and Sexual Activity  . Alcohol use: No    Alcohol/week: 0.0 standard drinks    Comment: Occassionally  . Drug use: No  . Sexual activity: Not Currently    Partners: Male    Birth control/protection: None  Lifestyle  . Physical activity:    Days per week: Not on file    Minutes per session: Not on file  . Stress: Not on file  Relationships  . Social connections:    Talks on phone:  Not on file    Gets together: Not on file    Attends religious service: Not on file    Active member of club or organization: Not on file    Attends meetings of clubs or organizations: Not on file    Relationship status: Not on file  . Intimate partner violence:    Fear of current or ex partner: Not on file    Emotionally abused: Not on file    Physically abused: Not on file    Forced sexual activity: Not on file  Other Topics Concern  . Not on file  Social History Narrative   ** Merged History Encounter **       ** Merged History Encounter **       No current facility-administered medications on file prior to encounter.    Current Outpatient Medications on File Prior to Encounter   Medication Sig Dispense Refill  . acetaminophen (TYLENOL) 500 MG tablet Take 2 tablets (1,000 mg total) by mouth every 6 (six) hours as needed for moderate pain. 30 tablet 0  . prochlorperazine (COMPAZINE) 10 MG tablet Take 1 tablet (10 mg total) by mouth 2 (two) times daily as needed for nausea or vomiting. 10 tablet 0   No Known Allergies  ROS:  Review of Systems  Constitutional: Negative.   Respiratory: Negative.   Cardiovascular: Negative.   Gastrointestinal: Positive for abdominal pain and nausea. Negative for constipation, diarrhea and vomiting.  Genitourinary: Positive for vaginal bleeding. Negative for difficulty urinating, dysuria, frequency, pelvic pain, urgency and vaginal discharge.  Musculoskeletal: Negative.    I have reviewed patient's Past Medical Hx, Surgical Hx, Family Hx, Social Hx, medications and allergies.   Physical Exam   Patient Vitals for the past 24 hrs:  BP Temp Temp src Pulse Resp SpO2 Height Weight  09/03/17 0534 125/83 98.3 F (36.8 C) Oral 82 19 - - -  09/03/17 0354 129/69 98.4 F (36.9 C) Oral 68 18 100 % 5\' 11"  (1.803 m) 125.6 kg   Constitutional: Well-developed, obese female in no acute distress.  Cardiovascular: normal rate Respiratory: normal effort GI: Abd soft, non-tender. Pos BS x 4 MS: Extremities nontender, no edema, normal ROM Neurologic: Alert and oriented x 4.   PELVIC EXAM: Cervix pink, visually closed, without lesion, scant pink vaginal bleeding noted around cervical os, vaginal walls and external genitalia normal Bimanual exam: Cervix 0/long/high, firm, anterior, neg CMT, uterus nontender, nonenlarged, adnexa without tenderness, enlargement, or mass  LAB RESULTS Results for orders placed or performed during the hospital encounter of 09/03/17 (from the past 24 hour(s))  Wet prep, genital     Status: Abnormal   Collection Time: 09/03/17  4:15 AM  Result Value Ref Range   Yeast Wet Prep HPF POC NONE SEEN NONE SEEN   Trich, Wet  Prep NONE SEEN NONE SEEN   Clue Cells Wet Prep HPF POC PRESENT (A) NONE SEEN   WBC, Wet Prep HPF POC MANY (A) NONE SEEN   Sperm NONE SEEN   Urinalysis, Routine w reflex microscopic     Status: Abnormal   Collection Time: 09/03/17  4:15 AM  Result Value Ref Range   Color, Urine YELLOW YELLOW   APPearance HAZY (A) CLEAR   Specific Gravity, Urine 1.029 1.005 - 1.030   pH 6.0 5.0 - 8.0   Glucose, UA NEGATIVE NEGATIVE mg/dL   Hgb urine dipstick LARGE (A) NEGATIVE   Bilirubin Urine NEGATIVE NEGATIVE   Ketones, ur NEGATIVE NEGATIVE mg/dL  Protein, ur NEGATIVE NEGATIVE mg/dL   Nitrite NEGATIVE NEGATIVE   Leukocytes, UA MODERATE (A) NEGATIVE   RBC / HPF 0-5 0 - 5 RBC/hpf   WBC, UA 11-20 0 - 5 WBC/hpf   Bacteria, UA RARE (A) NONE SEEN   Squamous Epithelial / LPF 11-20 0 - 5   Mucus PRESENT     IMAGING US Ob Transvaginal  Result Date: 09/03/2017 CLINICAL DATA:  Cramping for 4 days. Vaginal bleeding since 1 a.m. Assigned gestational age based on previous ultrasound 08/18/2017 is 9 weeks 1 day. EXAM: TRANSVAGINAL OB ULTRASOUND TECHNIQUE: Transvaginal ultrasound was performed for complete evaluation of the gestation as well as the maternal uterus, adnexal regions, and pelvic cul-de-sac. COMPARISON:  08/18/2017 FINDINGS: Intrauterine gestational sac: Single Yolk sac:  Visualized. Embryo:  Visualized. Cardiac Activity: Visualized. Heart Rate: 178 bpm CRL: 29.9 mm   9 w 5 d                  [place] EDC: 04/03/2018 Subchorionic hemorrhage:  None visualized. Maternal uterus/adnexae: No myometrial mass lesions are demonstrated. Both ovaries are visualized and appear normal. No abnormal pelvic fluid collections. IMPRESSION: Single living intrauterine pregnancy. Estimated gestational age by crown-rump length is 9 weeks 5 days, representing appropriate interval growth since the previous study. No acute complication is suggested on ultrasound. Electronically Signed   By: Burman Nieves M.D.   On: 09/03/2017 05:06     MAU Management/MDM: Orders Placed This Encounter  Procedures  . Wet prep, genital  . Culture, OB Urine  . US OB Transvaginal  . Urinalysis, Routine w reflex microscopic   Wet prep- positive for clue cells otherwise negative  Urine culture- pending  UA- moderate leukocytes, WBC increased and rare bacteria- will treat for UTI while culture pending  Meds ordered this encounter  Medications  . acetaminophen (TYLENOL) tablet 1,000 mg  . cephALEXin (KEFLEX) 500 MG capsule    Sig: Take 1 capsule (500 mg total) by mouth 4 (four) times daily.    Dispense:  28 capsule    Refill:  0    Order Specific Question:   Supervising Provider    Answer:   Duane Lope H [[id number]]   Treatments in MAU included Tylenol 1,000mg  for abdominal cramping. Patient reports relief of abdominal cramping with medication treatment. Rates 0/10.  [place] results reviewed with patient. Make appointment to be seen to initiate prenatal care. Discussed reasons to return to MAU for increased vaginal bleeding like a period and/or increased abdominal pain.  Pt discharged. Pt stable at time of discharge.   ASSESSMENT 1. Acute cystitis with hematuria   2. Vaginal bleeding affecting early pregnancy   3. [redacted] weeks gestation of pregnancy     PLAN Discharge home Rx for Keflex sent to pharmacy of choice  Return to MAU as needed for reasons discussed  Make appointment to be seen to initiate prenatal care   Follow-up Information    [facility].   Specialty:  Obstetrics and Gynecology Why:  Make appointment to be seen to start prenatal care Contact information: [address] [place] [address] [phone]          Allergies as of 09/03/2017   No Known Allergies     Medication List    TAKE these medications   acetaminophen 500 MG tablet Commonly known as:  TYLENOL Take 2 tablets (1,000 mg total) by mouth every 6 (six) hours as needed for moderate pain.  cephALEXin 500 MG capsule Commonly known as:  KEFLEX Take 1 capsule (500 mg total) by mouth 4 (four) times daily.   prochlorperazine 10 MG tablet Commonly known as:  COMPAZINE Take 1 tablet (10 mg total) by mouth 2 (two) times daily as needed for nausea or vomiting.        Steward DroneVeronica Yeraldy Spike  Certified Nurse-Midwife 09/03/2017  5:29 AM

## 2017-09-03 NOTE — MAU Note (Signed)
Pt reports cramping for 4 days. Bleeding that started around 1 am. Pt reports a blood clot the size of a quarter

## 2017-09-05 LAB — CULTURE, OB URINE

## 2017-09-18 ENCOUNTER — Encounter (HOSPITAL_COMMUNITY): Payer: Self-pay | Admitting: *Deleted

## 2017-09-18 ENCOUNTER — Inpatient Hospital Stay (HOSPITAL_COMMUNITY)
Admission: AD | Admit: 2017-09-18 | Discharge: 2017-09-18 | Disposition: A | Discharge status: Home / Self Care | Payer: Medicaid Other | Source: Ambulatory Visit | Attending: Obstetrics & Gynecology | Admitting: Obstetrics & Gynecology

## 2017-09-18 DIAGNOSIS — E669 Obesity, unspecified: Secondary | ICD-10-CM | POA: Insufficient documentation

## 2017-09-18 DIAGNOSIS — Z9049 Acquired absence of other specified parts of digestive tract: Secondary | ICD-10-CM | POA: Diagnosis not present

## 2017-09-18 DIAGNOSIS — N760 Acute vaginitis: Secondary | ICD-10-CM

## 2017-09-18 DIAGNOSIS — O99341 Other mental disorders complicating pregnancy, first trimester: Secondary | ICD-10-CM | POA: Diagnosis not present

## 2017-09-18 DIAGNOSIS — F419 Anxiety disorder, unspecified: Secondary | ICD-10-CM | POA: Insufficient documentation

## 2017-09-18 DIAGNOSIS — B9689 Other specified bacterial agents as the cause of diseases classified elsewhere: Secondary | ICD-10-CM | POA: Insufficient documentation

## 2017-09-18 DIAGNOSIS — Z79899 Other long term (current) drug therapy: Secondary | ICD-10-CM | POA: Diagnosis not present

## 2017-09-18 DIAGNOSIS — O99211 Obesity complicating pregnancy, first trimester: Secondary | ICD-10-CM | POA: Diagnosis not present

## 2017-09-18 DIAGNOSIS — N898 Other specified noninflammatory disorders of vagina: Secondary | ICD-10-CM | POA: Diagnosis present

## 2017-09-18 DIAGNOSIS — O26891 Other specified pregnancy related conditions, first trimester: Secondary | ICD-10-CM | POA: Diagnosis not present

## 2017-09-18 DIAGNOSIS — J45909 Unspecified asthma, uncomplicated: Secondary | ICD-10-CM | POA: Insufficient documentation

## 2017-09-18 DIAGNOSIS — Z3A11 11 weeks gestation of pregnancy: Secondary | ICD-10-CM | POA: Insufficient documentation

## 2017-09-18 DIAGNOSIS — O99511 Diseases of the respiratory system complicating pregnancy, first trimester: Secondary | ICD-10-CM | POA: Insufficient documentation

## 2017-09-18 DIAGNOSIS — F329 Major depressive disorder, single episode, unspecified: Secondary | ICD-10-CM | POA: Insufficient documentation

## 2017-09-18 DIAGNOSIS — Z87891 Personal history of nicotine dependence: Secondary | ICD-10-CM | POA: Insufficient documentation

## 2017-09-18 DIAGNOSIS — O23591 Infection of other part of genital tract in pregnancy, first trimester: Secondary | ICD-10-CM | POA: Diagnosis not present

## 2017-09-18 LAB — URINALYSIS, ROUTINE W REFLEX MICROSCOPIC
BILIRUBIN URINE: NEGATIVE
Glucose, UA: NEGATIVE mg/dL
Hgb urine dipstick: NEGATIVE
Ketones, ur: NEGATIVE mg/dL
Nitrite: NEGATIVE
Protein, ur: NEGATIVE mg/dL
SPECIFIC GRAVITY, URINE: 1.029 (ref 1.005–1.030)
pH: 6 (ref 5.0–8.0)

## 2017-09-18 LAB — WET PREP, GENITAL
SPERM: NONE SEEN
TRICH WET PREP: NONE SEEN
Yeast Wet Prep HPF POC: NONE SEEN

## 2017-09-18 MED ORDER — METRONIDAZOLE 500 MG PO TABS: 500.0000 mg | ORAL_TABLET | Freq: Two times a day (BID) | ORAL | 0 refills | Status: AC

## 2017-09-18 NOTE — Discharge Instructions (Signed)
Bacterial Vaginosis Bacterial vaginosis is a vaginal infection that occurs when the normal balance of bacteria in the vagina is disrupted. It results from an overgrowth of certain bacteria. This is the most common vaginal infection among women ages 15-44. Because bacterial vaginosis increases your risk for STIs (sexually transmitted infections), getting treated can help reduce your risk for chlamydia, gonorrhea, herpes, and HIV (human immunodeficiency virus). Treatment is also important for preventing complications in pregnant women, because this condition can cause an early (premature) delivery. What are the causes? This condition is caused by an increase in harmful bacteria that are normally present in small amounts in the vagina. However, the reason that the condition develops is not fully understood. What increases the risk? The following factors may make you more likely to develop this condition:  Having a new sexual partner or multiple sexual partners.  Having unprotected sex.  Douching.  Having an intrauterine device (IUD).  Smoking.  Drug and alcohol abuse.  Taking certain antibiotic medicines.  Being pregnant.  You cannot get bacterial vaginosis from toilet seats, bedding, swimming pools, or contact with objects around you. What are the signs or symptoms? Symptoms of this condition include:  Grey or white vaginal discharge. The discharge can also be watery or foamy.  A fish-like odor with discharge, especially after sexual intercourse or during menstruation.  Itching in and around the vagina.  Burning or pain with urination.  Some women with bacterial vaginosis have no signs or symptoms. How is this diagnosed? This condition is diagnosed based on:  Your medical history.  A physical exam of the vagina.  Testing a sample of vaginal fluid under a microscope to look for a large amount of bad bacteria or abnormal cells. Your health care provider may use a cotton swab  or a small wooden spatula to collect the sample.  How is this treated? This condition is treated with antibiotics. These may be given as a pill, a vaginal cream, or a medicine that is put into the vagina (suppository). If the condition comes back after treatment, a second round of antibiotics may be needed. Follow these instructions at home: Medicines  Take over-the-counter and prescription medicines only as told by your health care provider.  Take or use your antibiotic as told by your health care provider. Do not stop taking or using the antibiotic even if you start to feel better. General instructions  If you have a female sexual partner, tell her that you have a vaginal infection. She should see her health care provider and be treated if she has symptoms. If you have a female sexual partner, he does not need treatment.  During treatment: ? Avoid sexual activity until you finish treatment. ? Do not douche. ? Avoid alcohol as directed by your health care provider. ? Avoid breastfeeding as directed by your health care provider.  Drink enough water and fluids to keep your urine clear or pale yellow.  Keep the area around your vagina and rectum clean. ? Wash the area daily with warm water. ? Wipe yourself from front to back after using the toilet.  Keep all follow-up visits as told by your health care provider. This is important. How is this prevented?  Do not douche.  Wash the outside of your vagina with warm water only.  Use protection when having sex. This includes latex condoms and dental dams.  Limit how many sexual partners you have. To help prevent bacterial vaginosis, it is best to have sex with just   one partner (monogamous).  Make sure you and your sexual partner are tested for STIs.  Wear cotton or cotton-lined underwear.  Avoid wearing tight pants and pantyhose, especially during summer.  Limit the amount of alcohol that you drink.  Do not use any products that  contain nicotine or tobacco, such as cigarettes and e-cigarettes. If you need help quitting, ask your health care provider.  Do not use illegal drugs. Where to find more information:  Centers for Disease Control and Prevention: www.cdc.gov/std  American Sexual Health Association (ASHA): www.ashastd.org  U.S. Department of Health and Human Services, Office on Women's Health: www.womenshealth.gov/ or https://www.womenshealth.gov/a-z-topics/bacterial-vaginosis Contact a health care provider if:  Your symptoms do not improve, even after treatment.  You have more discharge or pain when urinating.  You have a fever.  You have pain in your abdomen.  You have pain during sex.  You have vaginal bleeding between periods. Summary  Bacterial vaginosis is a vaginal infection that occurs when the normal balance of bacteria in the vagina is disrupted.  Because bacterial vaginosis increases your risk for STIs (sexually transmitted infections), getting treated can help reduce your risk for chlamydia, gonorrhea, herpes, and HIV (human immunodeficiency virus). Treatment is also important for preventing complications in pregnant women, because the condition can cause an early (premature) delivery.  This condition is treated with antibiotic medicines. These may be given as a pill, a vaginal cream, or a medicine that is put into the vagina (suppository). This information is not intended to replace advice given to you by your health care provider. Make sure you discuss any questions you have with your health care provider. Document Released: 01/06/2005 Document Revised: 05/12/2016 Document Reviewed: 09/22/2015 Elsevier Interactive Patient Education  2018 Elsevier Inc.  

## 2017-09-18 NOTE — MAU Provider Note (Signed)
Chief Complaint: Vaginal Discharge   First Provider Initiated Contact with Patient 09/18/17 0555     SUBJECTIVE HPI: Jacqueline Clarke is a 32 y.o. [id number] at [redacted]w[redacted]d by LMP who presents to maternity admissions reporting leaking fluid and had some bleeding last Sunday.  Did have intercourse today. . She denies vaginal itching/burning, urinary symptoms, h/a, dizziness, n/v, or fever/chills.    Vaginal Discharge  The patient's primary symptoms include vaginal bleeding (on sunday) and vaginal discharge. The patient's pertinent negatives include no genital itching, genital lesions, genital odor or pelvic pain. This is a new problem. The current episode started today. The problem occurs intermittently. She is pregnant. Pertinent negatives include no abdominal pain, back pain, constipation, diarrhea or fever. The vaginal discharge was milky. The vaginal bleeding is spotting. She has not been passing clots. She has not been passing tissue. Nothing aggravates the symptoms. She has tried nothing for the symptoms.   RN Note: PT SAYS  ON Sunday -  SHE STARTED LEAKING  D/C. - HAS CONTINUED.  HAD SOME BLEEDING  ON Sunday- ON UNDERWEAR.  LAST SEX-    TODAY .     Past Medical History:  Diagnosis Date  . Anemia   . Anxiety   . Asthma   . Blood type, Rh negative   . Cholelithiasis   . Chronic back pain    scoliosis- on percocet  . Depression   . Headache(784.0)   . Hyperemesis complicating pregnancy, antepartum 2013  . 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 Obesity   . Pregnancy induced hypertension   . Scoliosis    Past Surgical History:  Procedure Laterality Date  . arm surgery    . CESAREAN SECTION N/A 06/23/2016   Procedure: CESAREAN SECTION;  Surgeon: Hugo Bing, MD;  Location: [facility];  Service: Obstetrics;  Laterality: N/A;  . CHOLECYSTECTOMY    . DILATION AND CURETTAGE OF UTERUS  2008  . MR WRIST RIGHT     sx to nerves  . NERVE, TENDON AND ARTERY REPAIR  Right 06/18/2014   Procedure: NERVE, TENDON AND ARTERY REPAIR;  Surgeon: Bradly Bienenstock, MD;  Location: [facility] OR;  Service: Orthopedics;  Laterality: Right;  . TONSILLECTOMY AND ADENOIDECTOMY    . VAGINAL DELIVERY  06/23/2016   Procedure: VAGINAL DELIVERY;  Surgeon: [place] Bing, MD;  Location: [facility] BIRTHING SUIT[facility];  Service: Obstetrics;;   Social History   Socioeconomic History  . Marital status: Single    Spouse name: Not on file  . Number of children: Not on file  . Years of education: Not on file  . Highest education level: Not on file  Occupational History  . Not on file  Social Needs  . Financial resource strain: Not on file  . Food insecurity:    Worry: Not on file    Inability: Not on file  . Transportation needs:    Medical: Not on file    Non-medical: Not on file  Tobacco Use  . Smoking status: Former Smoker    Packs/day: 0.25    Types: Cigarettes    Last attempt to quit: 08/02/2017    Years since quitting: 0.1  . Smokeless tobacco: Never Used  Substance and Sexual Activity  . Alcohol use: No    Alcohol/week: 0.0 standard drinks    Comment: Occassionally  . Drug use: No  . Sexual activity: Not Currently    Partners: Male    Birth control/protection: None  Lifestyle  . Physical  activity:    Days per week: Not on file    Minutes per session: Not on file  . Stress: Not on file  Relationships  . Social connections:    Talks on phone: Not on file    Gets together: Not on file    Attends religious service: Not on file    Active member of club or organization: Not on file    Attends meetings of clubs or organizations: Not on file    Relationship status: Not on file  . Intimate partner violence:    Fear of current or ex partner: Not on file    Emotionally abused: Not on file    Physically abused: Not on file    Forced sexual activity: Not on file  Other Topics Concern  . Not on file  Social History Narrative   ** Merged History Encounter **       ** Merged  History Encounter **       No current facility-administered medications on file prior to encounter.    Current Outpatient Medications on File Prior to Encounter  Medication Sig Dispense Refill  . cephALEXin (KEFLEX) 500 MG capsule Take 1 capsule (500 mg total) by mouth 4 (four) times daily. 28 capsule 0  . prochlorperazine (COMPAZINE) 10 MG tablet Take 1 tablet (10 mg total) by mouth 2 (two) times daily as needed for nausea or vomiting. 10 tablet 0  . acetaminophen (TYLENOL) 500 MG tablet Take 2 tablets (1,000 mg total) by mouth every 6 (six) hours as needed for moderate pain. 30 tablet 0   No Known Allergies  I have reviewed patient's Past Medical Hx, Surgical Hx, Family Hx, Social Hx, medications and allergies.   ROS:  Review of Systems  Constitutional: Negative for fever.  Gastrointestinal: Negative for abdominal pain, constipation and diarrhea.  Genitourinary: Positive for vaginal discharge. Negative for pelvic pain.  Musculoskeletal: Negative for back pain.   Review of Systems  Other systems negative   Physical Exam  Physical Exam Patient Vitals for the past 24 hrs:  BP Temp Temp src Pulse Resp Height Weight  09/18/17 0456 110/70 98.4 F (36.9 C) Oral 89 20 5\' 11"  (1.803 m) 128.3 kg   Constitutional: Well-developed, well-nourished female in no acute distress.  Cardiovascular: normal rate Respiratory: normal effort GI: Abd soft, non-tender. Pos BS x 4 MS: Extremities nontender, no edema, normal ROM Neurologic: Alert and oriented x 4.  GU: Neg CVAT.  PELVIC EXAM: Cervix pink, visually closed, without lesion, Moderate white milky discharge, vaginal walls and external genitalia normal Bimanual exam: Cervix 0/long/high, firm, anterior, neg CMT, uterus nontender, nonenlarged, adnexa without tenderness, enlargement, or mass  FHT 160 by doppler  LAB RESULTS Results for orders placed or performed during the hospital encounter of 09/18/17 (from the past 72 hour(s))   Urinalysis, Routine w reflex microscopic     Status: Abnormal   Collection Time: 09/18/17  5:10 AM  Result Value Ref Range   Color, Urine YELLOW YELLOW   APPearance CLEAR CLEAR   Specific Gravity, Urine 1.029 1.005 - 1.030   pH 6.0 5.0 - 8.0   Glucose, UA NEGATIVE NEGATIVE mg/dL   Hgb urine dipstick NEGATIVE NEGATIVE   Bilirubin Urine NEGATIVE NEGATIVE   Ketones, ur NEGATIVE NEGATIVE mg/dL   Protein, ur NEGATIVE NEGATIVE mg/dL   Nitrite NEGATIVE NEGATIVE   Leukocytes, UA TRACE (A) NEGATIVE   RBC / HPF 0-5 0 - 5 RBC/hpf   WBC, UA 0-5 0 - 5 WBC/hpf  Bacteria, UA RARE (A) NONE SEEN   Squamous Epithelial / LPF 0-5 0 - 5   Mucus PRESENT    Sperm, UA PRESENT     Comment: Performed at [facility], [address] [place], [place] [address]  Wet prep, genital     Status: Abnormal   Collection Time: 09/18/17  6:09 AM  Result Value Ref Range   Yeast Wet Prep HPF POC NONE SEEN NONE SEEN   Trich, Wet Prep NONE SEEN NONE SEEN   Clue Cells Wet Prep HPF POC PRESENT (A) NONE SEEN   WBC, Wet Prep HPF POC MANY (A) NONE SEEN    Comment: MODERATE BACTERIA SEEN   Sperm NONE SEEN     Comment: Performed at [facility], [address], [place], [place] [address]     IMAGING   MAU Management/MDM: Ordered Wet prep and UA UA is negative Wet prep showed clue cells, presumptive for bacterial vaginosis given + whiff and copious fluid   ASSESSMENT Single intrauterine pregnancy at [redacted]w[redacted]d Bacterial vaginosis - Plan: Discharge patient  Vaginal discharge during pregnancy in first trimester   PLAN Discharge home Rx Flagyl 500mg  bid x 7 days  Pt stable at time of discharge. Encouraged to return here or to other Urgent Care/ED if she develops worsening of symptoms, increase in pain, fever, or other concerning symptoms.    Wynelle Bourgeois CNM, MSN Certified Nurse-Midwife 09/18/2017  5:55 AM

## 2017-09-18 NOTE — MAU Note (Signed)
PT SAYS  ON Sunday -  SHE STARTED LEAKING  D/C. - HAS CONTINUED.  HAD SOME BLEEDING  ON Sunday- ON UNDERWEAR.  LAST SEX-    TODAY .

## 2017-09-23 ENCOUNTER — Encounter: Payer: Medicaid Other | Admitting: Obstetrics & Gynecology

## 2017-09-27 ENCOUNTER — Other Ambulatory Visit: Payer: Self-pay

## 2017-09-27 ENCOUNTER — Inpatient Hospital Stay (HOSPITAL_COMMUNITY)
Admission: AD | Admit: 2017-09-27 | Discharge: 2017-09-28 | Disposition: A | Discharge status: Home / Self Care | Payer: Medicaid Other | Source: Ambulatory Visit | Attending: Obstetrics and Gynecology | Admitting: Obstetrics and Gynecology

## 2017-09-27 DIAGNOSIS — O9A211 Injury, poisoning and certain other consequences of external causes complicating pregnancy, first trimester: Secondary | ICD-10-CM | POA: Diagnosis present

## 2017-09-27 DIAGNOSIS — F419 Anxiety disorder, unspecified: Secondary | ICD-10-CM | POA: Diagnosis not present

## 2017-09-27 DIAGNOSIS — O99511 Diseases of the respiratory system complicating pregnancy, first trimester: Secondary | ICD-10-CM | POA: Insufficient documentation

## 2017-09-27 DIAGNOSIS — Z87891 Personal history of nicotine dependence: Secondary | ICD-10-CM | POA: Diagnosis not present

## 2017-09-27 DIAGNOSIS — J45909 Unspecified asthma, uncomplicated: Secondary | ICD-10-CM | POA: Insufficient documentation

## 2017-09-27 DIAGNOSIS — O9A212 Injury, poisoning and certain other consequences of external causes complicating pregnancy, second trimester: Secondary | ICD-10-CM | POA: Diagnosis not present

## 2017-09-27 DIAGNOSIS — Z3A12 12 weeks gestation of pregnancy: Secondary | ICD-10-CM | POA: Insufficient documentation

## 2017-09-27 DIAGNOSIS — G8929 Other chronic pain: Secondary | ICD-10-CM | POA: Diagnosis not present

## 2017-09-27 DIAGNOSIS — T1490XA Injury, unspecified, initial encounter: Secondary | ICD-10-CM | POA: Diagnosis present

## 2017-09-27 DIAGNOSIS — O9A219 Injury, poisoning and certain other consequences of external causes complicating pregnancy, unspecified trimester: Secondary | ICD-10-CM | POA: Diagnosis present

## 2017-09-27 DIAGNOSIS — O26891 Other specified pregnancy related conditions, first trimester: Secondary | ICD-10-CM | POA: Insufficient documentation

## 2017-09-27 DIAGNOSIS — O99341 Other mental disorders complicating pregnancy, first trimester: Secondary | ICD-10-CM | POA: Insufficient documentation

## 2017-09-27 DIAGNOSIS — M545 Low back pain: Secondary | ICD-10-CM | POA: Diagnosis not present

## 2017-09-27 DIAGNOSIS — Y93E1 Activity, personal bathing and showering: Secondary | ICD-10-CM | POA: Insufficient documentation

## 2017-09-27 DIAGNOSIS — O99211 Obesity complicating pregnancy, first trimester: Secondary | ICD-10-CM | POA: Diagnosis not present

## 2017-09-27 DIAGNOSIS — R109 Unspecified abdominal pain: Secondary | ICD-10-CM | POA: Diagnosis not present

## 2017-09-27 DIAGNOSIS — M419 Scoliosis, unspecified: Secondary | ICD-10-CM | POA: Insufficient documentation

## 2017-09-27 DIAGNOSIS — O99351 Diseases of the nervous system complicating pregnancy, first trimester: Secondary | ICD-10-CM | POA: Diagnosis not present

## 2017-09-27 DIAGNOSIS — W182XXA Fall in (into) shower or empty bathtub, initial encounter: Secondary | ICD-10-CM | POA: Diagnosis not present

## 2017-09-27 LAB — URINALYSIS, ROUTINE W REFLEX MICROSCOPIC
BILIRUBIN URINE: NEGATIVE
Bacteria, UA: NONE SEEN
Glucose, UA: NEGATIVE mg/dL
Hgb urine dipstick: NEGATIVE
KETONES UR: 5 mg/dL — AB
Nitrite: NEGATIVE
PH: 6 (ref 5.0–8.0)
PROTEIN: NEGATIVE mg/dL
Specific Gravity, Urine: 1.03 (ref 1.005–1.030)

## 2017-09-27 NOTE — MAU Note (Signed)
Pt reports that she was getting into the tub around 4:30 today, missed stepped, fell forward, but then tried to catch her balance and fell backward. Pt fell onto her bottom. Pt feeling cramping since. Pt states feeling lightheaded today. Pt states that she has had 3 bottles of water to drink today. Pt started cramping around 6 or 7 pm. Denies vaginal bleeding

## 2017-09-28 ENCOUNTER — Encounter (HOSPITAL_COMMUNITY): Payer: Self-pay | Admitting: *Deleted

## 2017-09-28 DIAGNOSIS — O9A219 Injury, poisoning and certain other consequences of external causes complicating pregnancy, unspecified trimester: Secondary | ICD-10-CM | POA: Diagnosis present

## 2017-09-28 DIAGNOSIS — O9A212 Injury, poisoning and certain other consequences of external causes complicating pregnancy, second trimester: Secondary | ICD-10-CM

## 2017-09-28 DIAGNOSIS — M545 Low back pain: Secondary | ICD-10-CM

## 2017-09-28 MED ORDER — CYCLOBENZAPRINE HCL 10 MG PO TABS
10.0000 mg | ORAL_TABLET | Freq: Once | ORAL | Status: AC
  Administered 2017-09-28: 10 mg via ORAL
  Filled 2017-09-28: qty 1

## 2017-09-28 NOTE — MAU Provider Note (Signed)
History     CSN: [id number]  Arrival date and time: 09/27/17 2020   First Provider Initiated Contact with Patient 09/28/17 0147      Chief Complaint  Patient presents with  . Fall  . Back Pain   HPI  Ms.  Jacqueline Clarke is a 32 y.o. year old [id number] female at [redacted]w[redacted]d weeks gestation who presents to MAU reporting she fell in the tub about 1630. She states she fell forward, tried to catch herself and ended up falling backwards. She fell hitting her bottom. She has pain in her lower back and down the LT side out thigh. She now reports feeling lightheaded. She reports having 3 bottles of water today. She states she started having abdominal pain/cramping aroun 1800 or 1900 this evening. She denies VB or LOF.  Past Medical History:  Diagnosis Date  . Anemia   . Anxiety   . Asthma   . Blood type, Rh negative   . Cholelithiasis   . Chronic back pain    scoliosis- on percocet  . Depression   . Headache(784.0)   . Hyperemesis complicating pregnancy, antepartum 2013  . 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 Obesity   . Pregnancy induced hypertension   . Scoliosis     Past Surgical History:  Procedure Laterality Date  . arm surgery    . CESAREAN SECTION N/A 06/23/2016   Procedure: CESAREAN SECTION;  Surgeon: [place] Bing, MD;  Location: [facility] BIRTHING SUITES;  Service: Obstetrics;  Laterality: N/A;  . CHOLECYSTECTOMY    . DILATION AND CURETTAGE OF UTERUS  2008  . MR WRIST RIGHT     sx to nerves  . NERVE, TENDON AND ARTERY REPAIR Right 06/18/2014   Procedure: NERVE, TENDON AND ARTERY REPAIR;  Surgeon: Bradly Bienenstock, MD;  Location: [facility] OR;  Service: Orthopedics;  Laterality: Right;  . TONSILLECTOMY AND ADENOIDECTOMY    . VAGINAL DELIVERY  06/23/2016   Procedure: VAGINAL DELIVERY;  Surgeon: Eros Bing, MD;  Location: [facility] BIRTHING SUITES;  Service: Obstetrics;;    Family History  Problem Relation Age of Onset  . Heart disease Maternal Grandfather   . Diabetes  Maternal Grandfather   . Heart disease Father   . Anesthesia problems Neg Hx     Social History   Tobacco Use  . Smoking status: Former Smoker    Packs/day: 0.25    Types: Cigarettes    Last attempt to quit: 08/02/2017    Years since quitting: 0.1  . Smokeless tobacco: Never Used  Substance Use Topics  . Alcohol use: No    Alcohol/week: 0.0 standard drinks    Comment: Occassionally  . Drug use: No    Allergies: No Known Allergies  Medications Prior to Admission  Medication Sig Dispense Refill Last Dose  . acetaminophen (TYLENOL) 500 MG tablet Take 2 tablets (1,000 mg total) by mouth every 6 (six) hours as needed for moderate pain. 30 tablet 0 09/27/2017 at Unknown time  . cephALEXin (KEFLEX) 500 MG capsule Take 1 capsule (500 mg total) by mouth 4 (four) times daily. 28 capsule 0 Past Week at Unknown time  . prochlorperazine (COMPAZINE) 10 MG tablet Take 1 tablet (10 mg total) by mouth 2 (two) times daily as needed for nausea or vomiting. 10 tablet 0 Past Week at Unknown time    Review of Systems  Constitutional: Negative.   HENT: Negative.   Respiratory: Negative.   Cardiovascular: Negative.   Gastrointestinal: Positive for abdominal  pain.  Endocrine: Negative.   Genitourinary: Positive for pelvic pain. Negative for vaginal bleeding and vaginal discharge.  Musculoskeletal: Positive for back pain and myalgias (LT of outer thigh).  Skin: Negative.   Neurological: Positive for headaches.  Hematological: Negative.   Psychiatric/Behavioral: Negative.    Physical Exam   Blood pressure (!) 103/59, pulse 82, temperature 98.2 F (36.8 C), temperature source Oral, resp. rate 18, height 5\' 11"  (1.803 m), weight 128.8 kg, last menstrual period 07/22/2017, SpO2 98 %, not currently breastfeeding.  Physical Exam  Nursing note and vitals reviewed. Constitutional: She is oriented to person, place, and time. She appears well-developed and well-nourished.  HENT:  Head: Normocephalic  and atraumatic.  Neck: Normal range of motion.  Cardiovascular: Normal rate and regular rhythm.  Respiratory: Effort normal.  GI: Soft. There is tenderness in the right lower quadrant and left lower quadrant.  Genitourinary:  Genitourinary Comments: Dilation: Closed Effacement (%): Thick Cervical Position: Posterior Presentation: Undeterminable Exam by:: Carloyn Jaeger, CNM   Neurological: She is alert and oriented to person, place, and time.  Skin: Skin is warm and dry. There is erythema (ecchymosis on LT upper, outer hip area ).  Psychiatric: She has a normal mood and affect. Her behavior is normal. Judgment and thought content normal.    MAU Course  Procedures  MDM CCUA FHTs by doppler: 171 bpm Flexeril 10 mg -- pain improved from 9/10 to 5/10  Results for orders placed or performed during the hospital encounter of 09/27/17 (from the past 24 hour(s))  Urinalysis, Routine w reflex microscopic     Status: Abnormal   Collection Time: 09/27/17  9:49 PM  Result Value Ref Range   Color, Urine YELLOW YELLOW   APPearance CLEAR CLEAR   Specific Gravity, Urine 1.030 1.005 - 1.030   pH 6.0 5.0 - 8.0   Glucose, UA NEGATIVE NEGATIVE mg/dL   Hgb urine dipstick NEGATIVE NEGATIVE   Bilirubin Urine NEGATIVE NEGATIVE   Ketones, ur 5 (A) NEGATIVE mg/dL   Protein, ur NEGATIVE NEGATIVE mg/dL   Nitrite NEGATIVE NEGATIVE   Leukocytes, UA SMALL (A) NEGATIVE   RBC / HPF 0-5 0 - 5 RBC/hpf   WBC, UA 6-10 0 - 5 WBC/hpf   Bacteria, UA NONE SEEN NONE SEEN   Squamous Epithelial / LPF 0-5 0 - 5   Mucus PRESENT      Assessment and Plan  Traumatic injury during pregnancy in second trimester - Plan: Discharge patient - Information provided on preventing injuries in pregnancy - Advised to take Tylenol 1000 mg every hrs prn pain - Discharge home  - Patient verbalized an understanding of the plan of care and agrees.   Raelyn Mora, MSN, CNM 09/28/2017, 1:47 AM

## 2017-09-28 NOTE — Discharge Instructions (Signed)
Preventing Injuries During Pregnancy °Trauma is the most common cause of injury and death in pregnant women. This can also result in serious harm to the baby or even death. °How can injuries affect my pregnancy? °Your baby is protected in the womb (uterus) by a sac filled with fluid (amniotic sac). Your baby can be harmed if there is a direct blow to your abdomen and pelvis. Trauma may be caused by: °· Falls. These are more common in the second and third trimester of pregnancy. °· Automobile accidents. °· Domestic violence or assault. °· Severe burns, such as from fire or electricity. ° °These injuries can result in: °· Tearing of your uterus. °· The placenta pulling away from the wall of the uterus (placental abruption). °· The amniotic sac breaking open (rupture of membranes). °· Blockage or decrease in the blood supply to your baby. °· Going into labor earlier than expected. °· Severe injuries to other parts of your body, such as your brain, spine, heart, lungs, or other organs. ° °Minor falls and low-impact automobile accidents do not usually harm your baby, even if they cause a little harm to you. °What can I do to lower my risk? °Safety °· Remove slippery rugs and loose objects on the floor. They increase your risk of tripping or slipping. °· Wear comfortable shoes that have a good grip on the sole. Do not wear high-heeled shoes. °· Always wear your seat belt properly when riding in a car. Use both the lap and shoulder belt, with the lap belt below your abdomen. Always practice safe driving. Do not ride on a motorcycle while pregnant. °Activity °· Avoid walking on wet or slippery floors. °· Do not participate in rough and violent activities or sports. °· Avoid high-risk situations and activities such as: °? Lifting heavy pots of boiling or hot liquids. °? Fixing electrical problems. °? Being near fires or starting fires. °General instructions °· Take over-the-counter and prescription medicines only as told by  your health care provider. °· Know your blood type and the father's blood type in case you develop vaginal bleeding or experience an injury for which a blood transfusion is needed. °· Spousal abuse can be a serious cause of trauma during pregnancy. If you are a victim of domestic violence or assault: °? Call your local emergency services (911 in the [place] °? Contact the National Domestic Violence Hotline for help and support. °When should I seek immediate medical care? °Get help right away if: °· You fall on your abdomen or experience any serious blow to your abdomen. °· You develop stiffness in your neck or pain after a fall or from other trauma. °· You develop a headache or vision problems after a fall or from other trauma. °· You do not feel the baby moving after a fall or trauma, or you feel that the baby is not moving as much as before the fall or trauma. °· You have been the victim of domestic violence or any other kind of physical attack. °· You have been in a car accident. °· You develop vaginal bleeding. °· You have fluid leaking from the vagina. °· You develop uterine contractions. Symptoms include pelvic cramping, pain, or serious low back pain. °· You become weak, faint, or have uncontrolled vomiting after trauma. °· You have a serious burn. This includes burns to the face, neck, hands, or genitals, or burns greater than the size of your palm anywhere else. ° °Summary °· Trauma is the most common cause of   injury and death in pregnant women and can also lead to injury or death of the baby. °· Falls, automobile accidents, domestic violence or assault, and severe burns can injure you or your baby. Make sure to get medical help right away if you experience any of these during your pregnancy. °· Take steps to prevent slips or falls in your home, such as avoiding slippery floors and removing loose rugs. °· Always wear your seat belt properly when riding in a car. Practice safe driving. °This information is  not intended to replace advice given to you by your health care provider. Make sure you discuss any questions you have with your health care provider. °Document Released: 02/14/2004 Document Revised: 01/16/2016 Document Reviewed: 01/16/2016 °Elsevier Interactive Patient Education © 2017 [employer] ° °

## 2017-10-05 ENCOUNTER — Encounter: Payer: Medicaid Other | Admitting: Obstetrics

## 2017-10-07 ENCOUNTER — Ambulatory Visit (INDEPENDENT_AMBULATORY_CARE_PROVIDER_SITE_OTHER): Payer: Medicaid Other | Admitting: Obstetrics

## 2017-10-07 ENCOUNTER — Other Ambulatory Visit (HOSPITAL_COMMUNITY)
Admission: RE | Admit: 2017-10-07 | Discharge: 2017-10-07 | Disposition: A | Discharge status: Home / Self Care | Payer: Medicaid Other | Source: Ambulatory Visit | Attending: Obstetrics | Admitting: Obstetrics

## 2017-10-07 ENCOUNTER — Encounter: Payer: Self-pay | Admitting: Obstetrics

## 2017-10-07 VITALS — BP 111/75 | HR 94 | Wt 285.0 lb

## 2017-10-07 DIAGNOSIS — O0990 Supervision of high risk pregnancy, unspecified, unspecified trimester: Secondary | ICD-10-CM | POA: Insufficient documentation

## 2017-10-07 DIAGNOSIS — Z3490 Encounter for supervision of normal pregnancy, unspecified, unspecified trimester: Secondary | ICD-10-CM

## 2017-10-07 DIAGNOSIS — O99211 Obesity complicating pregnancy, first trimester: Secondary | ICD-10-CM

## 2017-10-07 DIAGNOSIS — Z8759 Personal history of other complications of pregnancy, childbirth and the puerperium: Secondary | ICD-10-CM

## 2017-10-07 DIAGNOSIS — Z9189 Other specified personal risk factors, not elsewhere classified: Secondary | ICD-10-CM

## 2017-10-07 DIAGNOSIS — Z3492 Encounter for supervision of normal pregnancy, unspecified, second trimester: Secondary | ICD-10-CM | POA: Diagnosis not present

## 2017-10-07 DIAGNOSIS — Z3481 Encounter for supervision of other normal pregnancy, first trimester: Secondary | ICD-10-CM | POA: Diagnosis not present

## 2017-10-07 DIAGNOSIS — Z6841 Body Mass Index (BMI) 40.0 and over, adult: Secondary | ICD-10-CM

## 2017-10-07 HISTORY — DX: Supervision of high risk pregnancy, unspecified, unspecified trimester: O09.90

## 2017-10-07 MED ORDER — VITAFOL ULTRA 29-0.6-0.4-200 MG PO CAPS: 1.0000 | ORAL_CAPSULE | Freq: Every day | ORAL | 4 refills | Status: DC

## 2017-10-07 MED ORDER — ASPIRIN 81 MG PO CHEW: 81.0000 mg | CHEWABLE_TABLET | Freq: Every day | ORAL | 5 refills | Status: DC

## 2017-10-07 NOTE — Progress Notes (Signed)
Subjective:    Jacqueline Clarke is being seen today for her first obstetrical visit.  This is not a planned pregnancy. She is at 4072w0d gestation. Her obstetrical history is significant for pre-eclampsia, pyelonephritis and depression. Relationship with FOB: significant other, not living together. Patient does intend to breast feed. Pregnancy history fully reviewed.  The information documented in the HPI was reviewed and verified.  Menstrual History: OB History    Gravida  8   Para  5   Term  4   Preterm  1   AB  2   Living  6     SAB  2   TAB  0   Ectopic  0   Multiple  1   Live Births  6            Patient's last menstrual period was 07/22/2017.    Past Medical History:  Diagnosis Date  . Anemia   . Anxiety   . Asthma   . Blood type, Rh negative   . Cholelithiasis   . Chronic back pain    scoliosis- on percocet  . Depression   . Headache(784.0)   . Hyperemesis complicating pregnancy, antepartum 2013  . 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 Obesity   . Pregnancy induced hypertension   . Scoliosis     Past Surgical History:  Procedure Laterality Date  . arm surgery    . CESAREAN SECTION N/A 06/23/2016   Procedure: CESAREAN SECTION;  Surgeon: [place] BingPickens, Charlie, MD;  [facility];  Service: Obstetrics;  Laterality: N/A;  . CHOLECYSTECTOMY    . DILATION AND CURETTAGE OF UTERUS  2008  . MR WRIST RIGHT     sx to nerves  . NERVE, TENDON AND ARTERY REPAIR Right 06/18/2014   Procedure: NERVE, TENDON AND ARTERY REPAIR;  Surgeon: Bradly BienenstockFred Ortmann, MD;  Location: [facility] OR;  Service: Orthopedics;  Laterality: Right;  . TONSILLECTOMY AND ADENOIDECTOMY    . VAGINAL DELIVERY  06/23/2016   Procedure: VAGINAL DELIVERY;  Surgeon: [place] BingPickens, Charlie, MD;  [facility];  Service: Obstetrics;;     (Not in a hospital admission) No Known Allergies  Social History   Tobacco Use  . Smoking status: Former Smoker    Packs/day: 0.25     Types: Cigarettes    Last attempt to quit: 08/02/2017    Years since quitting: 0.1  . Smokeless tobacco: Never Used  Substance Use Topics  . Alcohol use: No    Alcohol/week: 0.0 standard drinks    Comment: Occassionally    Family History  Problem Relation Age of Onset  . Heart disease Maternal Grandfather   . Diabetes Maternal Grandfather   . Heart disease Father   . Anesthesia problems Neg Hx      Review of Systems Constitutional: negative for weight loss Gastrointestinal: negative for vomiting Genitourinary:negative for genital lesions and vaginal discharge and dysuria Musculoskeletal:negative for back pain Behavioral/Psych: negative for abusive relationship, depression, illegal drug usage and tobacco use    Objective:    BP 111/75   Pulse 94   Wt 285 lb (129.3 kg)   LMP 07/22/2017   BMI 39.75 kg/m  General Appearance:    Alert, cooperative, no distress, appears stated age  Head:    Normocephalic, without obvious abnormality, atraumatic  Eyes:    PERRL, conjunctiva/corneas clear, EOM's intact, fundi    benign, both eyes  Ears:    Normal TM's and external ear canals,  both ears  Nose:   Nares normal, septum midline, mucosa normal, no drainage    or sinus tenderness  Throat:   Lips, mucosa, and tongue normal; teeth and gums normal  Neck:   Supple, symmetrical, trachea midline, no adenopathy;    thyroid:  no enlargement/tenderness/nodules; no carotid   bruit or JVD  Back:     Symmetric, no curvature, ROM normal, no CVA tenderness  Lungs:     Clear to auscultation bilaterally, respirations unlabored  Chest Wall:    No tenderness or deformity   Heart:    Regular rate and rhythm, S1 and S2 normal, no murmur, rub   or gallop  Breast Exam:    No tenderness, masses, or nipple abnormality  Abdomen:     Soft, non-tender, bowel sounds active all four quadrants,    no masses, no organomegaly  Genitalia:    Normal female without lesion, discharge or tenderness  Extremities:    Extremities normal, atraumatic, no cyanosis or edema  Pulses:   2+ and symmetric all extremities  Skin:   Skin color, texture, turgor normal, no rashes or lesions  Lymph nodes:   Cervical, supraclavicular, and axillary nodes normal  Neurologic:   CNII-XII intact, normal strength, sensation and reflexes    throughout      Lab Review Urine pregnancy test Labs reviewed yes Radiologic studies reviewed yes   Assessment:    Pregnancy at [redacted] weeks gestation.  Plan:     1. Encounter for supervision of normal pregnancy, antepartum, unspecified gravidity Rx: - Obstetric Panel, Including HIV - Cytology - PAP - Genetic Screening - Hemoglobinopathy evaluation - Cystic Fibrosis Mutation 97 - SMN1 COPY NUMBER ANALYSIS (SMA Carrier Screen) - Hemoglobin A1c - VITAMIN D 25 Hydroxy (Vit-D Deficiency, Fractures) - Prenat-Fe Poly-Methfol-FA-DHA (VITAFOL ULTRA) 29-0.6-0.4-200 MG CAPS; Take 1 capsule by mouth daily before breakfast.  Dispense: 90 capsule; Refill: 4  2. At risk for depression  3. History of pre-eclampsia - start Baby ASA between 14-16 weeks  4. Class 3 severe obesity due to excess calories without serious comorbidity with body mass index (BMI) of 40.0 to 44.9 in adult [facility])    Prenatal vitamins.  Counseling provided regarding continued use of seat belts, cessation of alcohol consumption, smoking or use of illicit drugs; infection precautions i.e., influenza/TDAP immunizations, toxoplasmosis,CMV, parvovirus, listeria and varicella; workplace safety, exercise during pregnancy; routine dental care, safe medications, sexual activity, hot tubs, saunas, pools, travel, caffeine use, fish and methlymercury, potential toxins, hair treatments, varicose veins Weight gain recommendations per IOM guidelines reviewed: underweight/BMI< 18.5--> gain 28 - 40 lbs; normal weight/BMI 18.5 - 24.9--> gain 25 - 35 lbs; overweight/BMI 25 - 29.9--> gain 15 - 25 lbs; obese/BMI >30->gain  11 - 20  lbs Problem list reviewed and updated. FIRST/CF mutation testing/NIPT/QUAD SCREEN/fragile X/Ashkenazi Jewish population testing/Spinal muscular atrophy discussed: requested. Role of ultrasound in pregnancy discussed; fetal survey: requested. Amniocentesis discussed: not indicated.  No orders of the defined types were placed in this encounter.  Orders Placed This Encounter  Procedures  . Obstetric Panel, Including HIV  . Genetic Screening    PAN[place]AMA  . Hemoglobinopathy evaluation  . Cystic Fibrosis Mutation 97  . SMN1 COPY NUMBER ANALYSIS (SMA Carrier Screen)  . Hemoglobin A1c  . VITAMIN D 25 Hydroxy (Vit-D Deficiency, Fractures)    Follow up in 2 weeks. 50% of 20 min visit spent on counseling and coordination of care.     Brock Bad MD 10-07-2017

## 2017-10-08 ENCOUNTER — Other Ambulatory Visit: Payer: Medicaid Other

## 2017-10-08 ENCOUNTER — Encounter (HOSPITAL_COMMUNITY): Payer: Self-pay | Admitting: Emergency Medicine

## 2017-10-08 ENCOUNTER — Ambulatory Visit (HOSPITAL_COMMUNITY)
Admission: EM | Admit: 2017-10-08 | Discharge: 2017-10-08 | Disposition: A | Discharge status: Home / Self Care | Payer: Medicaid Other | Attending: Family Medicine | Admitting: Family Medicine

## 2017-10-08 DIAGNOSIS — S99921A Unspecified injury of right foot, initial encounter: Secondary | ICD-10-CM

## 2017-10-08 MED ORDER — BACITRACIN ZINC 500 UNIT/GM EX OINT: 1.0000 "application " | TOPICAL_OINTMENT | Freq: Two times a day (BID) | CUTANEOUS | 0 refills | Status: DC

## 2017-10-08 NOTE — ED Provider Notes (Signed)
[facility]    CSN: [id number] Arrival date & time: 10/08/17  1939     History   Chief Complaint Chief Complaint  Patient presents with  . Foot Pain    HPI Jacqueline Clarke is a 32 y.o. female.   Jacqueline Clarke presents with complaints of right great toe pain after she accidentally shut it in her car door prior to arrival. No numbness or tingling. Pain 10/10. Took tylenol which has not helped. Has not taken off shoe to see toe but has felt some bleeding. Denies any previous toe injury or surgery. [redacted] weeks pregnant. Ambulatory without difficulty. Hx of anxiety, asthma, back pain, depression, headache.     ROS per HPI.      Past Medical History:  Diagnosis Date  . Anemia   . Anxiety   . Asthma   . Blood type, Rh negative   . Cholelithiasis   . Chronic back pain    scoliosis- on percocet  . Depression   . Headache(784.0)   . Hyperemesis complicating pregnancy, antepartum 2013  . 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 Obesity   . Pregnancy induced hypertension   . Scoliosis     Patient Active Problem List   Diagnosis Date Noted  . Encounter for supervision of normal pregnancy, unspecified, unspecified trimester 10/07/2017  . Traumatic injury during pregnancy 09/28/2017  . 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 Depression, postpartum 07/09/2016  . Adjustment insomnia 07/09/2016  . History of ileus 07/06/2016  . Pyelonephritis 07/03/2016  . Gestational hypertension 06/30/2016  . Blood type, Rh negative   . Obesity   . Symptomatic anemia 06/02/2016  . Low vitamin D level 01/03/2016  . Depression 07/09/2015    Past Surgical History:  Procedure Laterality Date  . arm surgery    . CESAREAN SECTION N/A 06/23/2016   Procedure: CESAREAN SECTION;  Surgeon: [place] Bing, MD;  [facility]: [facility] BIRTHING SUITES;  Service: Obstetrics;  Laterality: N/A;  . CHOLECYSTECTOMY    . DILATION AND CURETTAGE OF UTERUS  2008  . MR WRIST RIGHT     sx to nerves  .  NERVE, TENDON AND ARTERY REPAIR Right 06/18/2014   Procedure: NERVE, TENDON AND ARTERY REPAIR;  Surgeon: Bradly Bienenstock, MD;  Location: [facility];  Service: Orthopedics;  Laterality: Right;  . TONSILLECTOMY AND ADENOIDECTOMY    . VAGINAL DELIVERY  06/23/2016   Procedure: VAGINAL DELIVERY;  Surgeon:  Bing, MD;  Location: [facility] BIRTHING SUITES;  Service: Obstetrics;;    OB History    Gravida  8   Para  5   Term  4   Preterm  1   AB  2   Living  6     SAB  2   TAB  0   Ectopic  0   Multiple  1   Live Births  6            Home Medications    Prior to Admission medications   Medication Sig Start Date End Date Taking? Authorizing Provider  acetaminophen (TYLENOL) 500 MG tablet Take 2 tablets (1,000 mg total) by mouth every 6 (six) hours as needed for moderate pain. 09/02/17   Sharyon Cable, CNM  aspirin 81 MG chewable tablet Chew 1 tablet (81 mg total) by mouth daily. 10/07/17   Brock Bad, MD  bacitracin ointment Apply 1 application topically 2 (two) times daily. 10/08/17   Georgetta Haber, NP  Prenat-Fe Poly-Methfol-FA-DHA (VITAFOL ULTRA) 29-0.6-0.4-200  MG CAPS Take 1 capsule by mouth daily before breakfast. 10/07/17   Brock BadHarper, Charles A, MD    Family History Family History  Problem Relation Age of Onset  . Heart disease Maternal Grandfather   . Diabetes Maternal Grandfather   . Heart disease Father   . Anesthesia problems Neg Hx     Social History Social History   Tobacco Use  . Smoking status: Former Smoker    Packs/day: 0.25    Types: Cigarettes    Last attempt to quit: 08/02/2017    Years since quitting: 0.1  . Smokeless tobacco: Never Used  Substance Use Topics  . Alcohol use: No    Alcohol/week: 0.0 standard drinks    Comment: Occassionally  . Drug use: No     Allergies   Patient has no known allergies.   Review of Systems Review of Systems   Physical Exam Triage Vital Signs ED Triage Vitals [10/08/17 2021]  Enc Vitals Group      BP 116/80     Pulse Rate 90     Resp 18     Temp 98.3 F (36.8 C)     Temp Source Oral     SpO2 100 %     Weight      Height      Head Circumference      Peak Flow      Pain Score      Pain Loc      Pain Edu?      Excl. in GC?    No data found.  Updated Vital Signs BP 116/80 (BP Location: Left Arm)   Pulse 90   Temp 98.3 F (36.8 C) (Oral)   Resp 18   LMP 07/22/2017   SpO2 100%   Physical Exam  Constitutional: She is oriented to person, place, and time. She appears well-developed and well-nourished. No distress.  Cardiovascular: Normal rate, regular rhythm and normal heart sounds.  Pulmonary/Chest: Effort normal and breath sounds normal.  Musculoskeletal:       Right ankle: Normal.       Right foot: There is tenderness and laceration. There is normal range of motion, no bony tenderness, no swelling, normal capillary refill, no crepitus and no deformity.       Feet:  Shallow laceration to right great toe just along the  Cuticle; nail is grossly deformed which appears to be at baseline related to fungus, deformed and hyperpigmented; sensation intact and cap refill WNL; bleeding well controlled; no MTP joint pain; pain at nail bed primarily; wound thoroughly cleansed and explored   Neurological: She is alert and oriented to person, place, and time.  Skin: Skin is warm and dry.     UC Treatments / Results  Labs (all labs ordered are listed, but only abnormal results are displayed) Labs Reviewed - No data to display  EKG None  Radiology No results found.  Procedures Procedures (including critical care time)  Medications Ordered in UC Medications - No data to display  Initial Impression / Assessment and Plan / UC Course  I have reviewed the triage vital signs and the nursing notes.  Pertinent labs & imaging results that were available during my care of the patient were reviewed by me and considered in my medical decision making (see chart for details).      Discussed risks/benefits of xray while [redacted] weeks pregnant of great toe. Pain primarily at distal toe and nail, patient opted to defer imaging at this time.  Discussed to monitor pain and symptoms as may decide to image if no improvement in the next few days. Nail degradation related to fungus, unable to provide any suture closure of superficial laceration as it is right at cuticle, would need to suture into nail bed. Wound cleansed and dressed. Post op shoe provided. Encouraged follow up with podiatry for further management as likely needs entire toe nail removed. Patient verbalized understanding and agreeable to plan. Tylenol, ice application for pain control.  Ambulatory out of clinic without difficulty.   Final Clinical Impressions(s) / UC Diagnoses   Final diagnoses:  Injury of toe on right foot, initial encounter  Injury of toenail of right foot, initial encounter     Discharge Instructions     Cleanse with soap and water daily.  Ointment and bandage application daily. Keep covered to keep clean.  Tylenol for pain control, ice application to help with pain and swelling.  Shoes with loose toe box to prevent pressure on toe.  Please follow up with podiatry as will likely need toe nail removed.    ED Prescriptions    Medication Sig Dispense Auth. Provider   bacitracin ointment Apply 1 application topically 2 (two) times daily. 120 g Georgetta Haber, NP     Controlled Substance Prescriptions [place] Controlled Substance Registry consulted? Not Applicable   Georgetta Haber, NP 10/08/17 2105

## 2017-10-08 NOTE — Discharge Instructions (Signed)
Cleanse with soap and water daily.  Ointment and bandage application daily. Keep covered to keep clean.  Tylenol for pain control, ice application to help with pain and swelling.  Shoes with loose toe box to prevent pressure on toe.  Please follow up with podiatry as will likely need toe nail removed.

## 2017-10-08 NOTE — ED Triage Notes (Addendum)
Pt sts shut right foot in door today; pt currently [redacted] weeks pregnant

## 2017-10-09 ENCOUNTER — Ambulatory Visit (INDEPENDENT_AMBULATORY_CARE_PROVIDER_SITE_OTHER): Payer: Medicaid Other | Admitting: Obstetrics

## 2017-10-09 ENCOUNTER — Encounter: Payer: Self-pay | Admitting: Obstetrics

## 2017-10-09 VITALS — BP 104/73 | HR 82 | Wt 293.0 lb

## 2017-10-09 DIAGNOSIS — Z3482 Encounter for supervision of other normal pregnancy, second trimester: Secondary | ICD-10-CM

## 2017-10-09 DIAGNOSIS — Z3490 Encounter for supervision of normal pregnancy, unspecified, unspecified trimester: Secondary | ICD-10-CM

## 2017-10-09 DIAGNOSIS — Z6841 Body Mass Index (BMI) 40.0 and over, adult: Secondary | ICD-10-CM

## 2017-10-09 DIAGNOSIS — O99212 Obesity complicating pregnancy, second trimester: Secondary | ICD-10-CM

## 2017-10-09 DIAGNOSIS — O9A219 Injury, poisoning and certain other consequences of external causes complicating pregnancy, unspecified trimester: Secondary | ICD-10-CM

## 2017-10-09 DIAGNOSIS — Z8759 Personal history of other complications of pregnancy, childbirth and the puerperium: Secondary | ICD-10-CM

## 2017-10-09 DIAGNOSIS — T148XXA Other injury of unspecified body region, initial encounter: Secondary | ICD-10-CM

## 2017-10-09 MED ORDER — OXYCODONE-ACETAMINOPHEN 10-325 MG PO TABS: 1.0000 | ORAL_TABLET | ORAL | 0 refills | Status: DC | PRN

## 2017-10-09 NOTE — Progress Notes (Signed)
[employer] pt c/o pain in R toe d/t closing it inside car door and tailbone pain d/t a fall. Seen at Urgent Care yesterday for toe pain and was not given pain meds per pt.

## 2017-10-09 NOTE — Progress Notes (Signed)
Subjective:  Jacqueline Clarke is a 32 y.o. [id number] at 5266w2d being seen today for ongoing prenatal care.  She is currently monitored for the following issues for this high-risk pregnancy and has Depression; Low vitamin D level; Symptomatic anemia; Blood type, Rh negative; Obesity; Gestational hypertension; Pyelonephritis; History of ileus; Primary insomnia; Depression, postpartum; Adjustment insomnia; Traumatic injury during pregnancy; and Encounter for supervision of normal pregnancy, unspecified, unspecified trimester on their problem list.  Patient reports fall and contusion to coccygeal area, and fracture of toe.  Contractions: Not present. Vag. Bleeding: None.  Movement: Present. Denies leaking of fluid.   The following portions of the patient's history were reviewed and updated as appropriate: allergies, current medications, past family history, past medical history, past social history, past surgical history and problem list. Problem list updated.  Objective:   Vitals:   10/09/17 1034  BP: 104/73  Pulse: 82  Weight: 293 lb (132.9 kg)    Fetal Status: Fetal Heart Rate (bpm): 150   Movement: Present     General:  Alert, oriented and cooperative. Patient is in no acute distress.  Skin: Skin is warm and dry. No rash noted.   Cardiovascular: Normal heart rate noted  Respiratory: Normal respiratory effort, no problems with respiration noted  Abdomen: Soft, gravid, appropriate for gestational age. Pain/Pressure: Present     Pelvic:  Cervical exam deferred        Extremities: Normal range of motion.  Edema: Trace  Mental Status: Normal mood and affect. Normal behavior. Normal judgment and thought content.   Urinalysis:      Assessment and Plan:  Pregnancy: [id number] at 6866w2d  1. Encounter for supervision of normal pregnancy, antepartum, unspecified gravidity  2. History of pre-eclampsia  3. Class 3 severe obesity due to excess calories without serious comorbidity with body mass  index (BMI) of 40.0 to 44.9 in adult (HCC)  4. Traumatic injury during pregnancy, antepartum - stable  5. Pain due to fracture Rx: - oxyCODONE-acetaminophen (PERCOCET) 10-325 MG tablet; Take 1 tablet by mouth every 4 (four) hours as needed for pain.  Dispense: 30 tablet; Refill: 0  Preterm labor symptoms and general obstetric precautions including but not limited to vaginal bleeding, contractions, leaking of fluid and fetal movement were reviewed in detail with the patient. Please refer to After Visit Summary for other counseling recommendations.  Return in about 2 weeks (around 10/23/2017) for ROB.   Brock BadHarper, Charles A, MD

## 2017-10-12 LAB — CYTOLOGY - PAP
Diagnosis: NEGATIVE
HPV: NOT DETECTED

## 2017-10-13 ENCOUNTER — Other Ambulatory Visit: Payer: Self-pay | Admitting: Obstetrics

## 2017-10-13 DIAGNOSIS — E559 Vitamin D deficiency, unspecified: Secondary | ICD-10-CM

## 2017-10-13 LAB — HEMOGLOBIN A1C
Est. average glucose Bld gHb Est-mCnc: 108 mg/dL
HEMOGLOBIN A1C: 5.4 % (ref 4.8–5.6)

## 2017-10-13 LAB — OBSTETRIC PANEL, INCLUDING HIV
Antibody Screen: NEGATIVE
BASOS: 0 %
Basophils Absolute: 0 10*3/uL (ref 0.0–0.2)
EOS (ABSOLUTE): 0.2 10*3/uL (ref 0.0–0.4)
Eos: 2 %
HEMATOCRIT: 35.5 % (ref 34.0–46.6)
HIV SCREEN 4TH GENERATION: NONREACTIVE
Hemoglobin: 11.4 g/dL (ref 11.1–15.9)
Hepatitis B Surface Ag: NEGATIVE
IMMATURE GRANS (ABS): 0 10*3/uL (ref 0.0–0.1)
Immature Granulocytes: 0 %
Lymphocytes Absolute: 2 10*3/uL (ref 0.7–3.1)
Lymphs: 21 %
MCH: 27.3 pg (ref 26.6–33.0)
MCHC: 32.1 g/dL (ref 31.5–35.7)
MCV: 85 fL (ref 79–97)
MONOS ABS: 0.7 10*3/uL (ref 0.1–0.9)
Monocytes: 8 %
Neutrophils Absolute: 6.5 10*3/uL (ref 1.4–7.0)
Neutrophils: 69 %
Platelets: 250 10*3/uL (ref 150–450)
RBC: 4.18 x10E6/uL (ref 3.77–5.28)
RDW: 15.7 % — AB (ref 12.3–15.4)
RPR Ser Ql: NONREACTIVE
Rh Factor: NEGATIVE
Rubella Antibodies, IGG: <0.9 index — ABNORMAL LOW (ref >0.99)
WBC: 9.5 10*3/uL (ref 3.4–10.8)

## 2017-10-13 LAB — HEMOGLOBINOPATHY EVALUATION
HEMOGLOBIN A2 QUANTITATION: 2.7 % (ref 1.8–3.2)
HEMOGLOBIN F QUANTITATION: 0 % (ref 0.0–2.0)
HGB A: 97.3 % (ref 96.4–98.8)
HGB C: 0 %
HGB S: 0 %
HGB VARIANT: 0 %

## 2017-10-13 LAB — VITAMIN D 25 HYDROXY (VIT D DEFICIENCY, FRACTURES): Vit D, 25-Hydroxy: 17.1 ng/mL — ABNORMAL LOW (ref 30.0–100.0)

## 2017-10-13 MED ORDER — VITAMIN D 50 MCG (2000 UT) PO CAPS: 1.0000 | ORAL_CAPSULE | Freq: Every day | ORAL | 11 refills | Status: DC

## 2017-10-15 LAB — SMN1 COPY NUMBER ANALYSIS (SMA CARRIER SCREENING)

## 2017-10-15 LAB — CYSTIC FIBROSIS MUTATION 97: Interpretation: NOT DETECTED

## 2017-10-21 ENCOUNTER — Encounter: Payer: Medicaid Other | Admitting: Certified Nurse Midwife

## 2017-11-06 ENCOUNTER — Encounter: Payer: Self-pay | Admitting: Family Medicine

## 2017-11-06 DIAGNOSIS — O99210 Obesity complicating pregnancy, unspecified trimester: Secondary | ICD-10-CM | POA: Insufficient documentation

## 2017-11-16 ENCOUNTER — Encounter: Payer: Medicaid Other | Admitting: Obstetrics

## 2017-11-22 ENCOUNTER — Encounter (HOSPITAL_COMMUNITY): Payer: Self-pay | Admitting: Emergency Medicine

## 2017-11-22 ENCOUNTER — Emergency Department (HOSPITAL_COMMUNITY)
Admission: EM | Admit: 2017-11-22 | Discharge: 2017-11-22 | Disposition: A | Discharge status: Other Acute Inpatient Hospital | Payer: Medicaid Other | Attending: Emergency Medicine | Admitting: Emergency Medicine

## 2017-11-22 ENCOUNTER — Other Ambulatory Visit: Payer: Self-pay

## 2017-11-22 DIAGNOSIS — Z87891 Personal history of nicotine dependence: Secondary | ICD-10-CM | POA: Diagnosis not present

## 2017-11-22 DIAGNOSIS — T50902A Poisoning by unspecified drugs, medicaments and biological substances, intentional self-harm, initial encounter: Secondary | ICD-10-CM | POA: Diagnosis not present

## 2017-11-22 DIAGNOSIS — Z79899 Other long term (current) drug therapy: Secondary | ICD-10-CM | POA: Insufficient documentation

## 2017-11-22 DIAGNOSIS — R45851 Suicidal ideations: Secondary | ICD-10-CM | POA: Diagnosis not present

## 2017-11-22 DIAGNOSIS — O99512 Diseases of the respiratory system complicating pregnancy, second trimester: Secondary | ICD-10-CM | POA: Insufficient documentation

## 2017-11-22 DIAGNOSIS — O99342 Other mental disorders complicating pregnancy, second trimester: Secondary | ICD-10-CM | POA: Diagnosis not present

## 2017-11-22 DIAGNOSIS — F332 Major depressive disorder, recurrent severe without psychotic features: Secondary | ICD-10-CM | POA: Insufficient documentation

## 2017-11-22 DIAGNOSIS — O9989 Other specified diseases and conditions complicating pregnancy, childbirth and the puerperium: Secondary | ICD-10-CM | POA: Diagnosis present

## 2017-11-22 LAB — IRON: IRON: 61 ug/dL (ref 28–170)

## 2017-11-22 LAB — ACETAMINOPHEN LEVEL

## 2017-11-22 LAB — COMPREHENSIVE METABOLIC PANEL
ALT: 8 U/L (ref 0–44)
ANION GAP: 8 (ref 5–15)
AST: 13 U/L — AB (ref 15–41)
Albumin: 2.8 g/dL — ABNORMAL LOW (ref 3.5–5.0)
Alkaline Phosphatase: 50 U/L (ref 38–126)
BILIRUBIN TOTAL: 0.4 mg/dL (ref 0.3–1.2)
BUN: 7 mg/dL (ref 6–20)
CO2: 22 mmol/L (ref 22–32)
Calcium: 8.5 mg/dL — ABNORMAL LOW (ref 8.9–10.3)
Chloride: 105 mmol/L (ref 98–111)
Creatinine, Ser: 0.72 mg/dL (ref 0.44–1.00)
GFR calc Af Amer: >60 mL/min (ref >60)
GFR calc non Af Amer: >60 mL/min (ref >60)
GLUCOSE: 81 mg/dL (ref 70–99)
POTASSIUM: 3.4 mmol/L — AB (ref 3.5–5.1)
Sodium: 135 mmol/L (ref 135–145)
Total Protein: 6.4 g/dL — ABNORMAL LOW (ref 6.5–8.1)

## 2017-11-22 LAB — ETHANOL: Alcohol, Ethyl (B): <10 mg/dL (ref <10)

## 2017-11-22 LAB — CBC
HEMATOCRIT: 33.3 % — AB (ref 36.0–46.0)
HEMOGLOBIN: 10.3 g/dL — AB (ref 12.0–15.0)
MCH: 27.5 pg (ref 26.0–34.0)
MCHC: 30.9 g/dL (ref 30.0–36.0)
MCV: 88.8 fL (ref 80.0–100.0)
NRBC: 0 % (ref 0.0–0.2)
Platelets: 208 10*3/uL (ref 150–400)
RBC: 3.75 MIL/uL — ABNORMAL LOW (ref 3.87–5.11)
RDW: 14.9 % (ref 11.5–15.5)
WBC: 9.3 10*3/uL (ref 4.0–10.5)

## 2017-11-22 LAB — RAPID URINE DRUG SCREEN, HOSP PERFORMED
Amphetamines: NOT DETECTED
BARBITURATES: NOT DETECTED
BENZODIAZEPINES: NOT DETECTED
COCAINE: POSITIVE — AB
OPIATES: NOT DETECTED
TETRAHYDROCANNABINOL: NOT DETECTED

## 2017-11-22 LAB — CBG MONITORING, ED
GLUCOSE-CAPILLARY: 109 mg/dL — AB (ref 70–99)
Glucose-Capillary: 66 mg/dL — ABNORMAL LOW (ref 70–99)

## 2017-11-22 LAB — I-STAT BETA HCG BLOOD, ED (MC, WL, AP ONLY): I-stat hCG, quantitative: >2000 m[IU]/mL — ABNORMAL HIGH (ref <5)

## 2017-11-22 LAB — SALICYLATE LEVEL: Salicylate Lvl: <7 mg/dL (ref 2.8–30.0)

## 2017-11-22 LAB — POC URINE PREG, ED: Preg Test, Ur: POSITIVE — AB

## 2017-11-22 MED ORDER — ONDANSETRON HCL 4 MG/2ML IJ SOLN
4.0000 mg | Freq: Once | INTRAMUSCULAR | Status: AC
  Administered 2017-11-22: 4 mg via INTRAVENOUS
  Filled 2017-11-22: qty 2

## 2017-11-22 MED ORDER — SODIUM CHLORIDE 0.9 % IV BOLUS
1000.0000 mL | Freq: Once | INTRAVENOUS | Status: AC
  Administered 2017-11-22: 1000 mL via INTRAVENOUS

## 2017-11-22 MED ORDER — ACETAMINOPHEN 325 MG PO TABS
650.0000 mg | ORAL_TABLET | ORAL | Status: DC | PRN
  Administered 2017-11-22: 650 mg via ORAL
  Filled 2017-11-22: qty 2

## 2017-11-22 NOTE — ED Notes (Signed)
IVC paper has been served and 3 copies placed on chart and original placed in red folder in PODF; Copies has been faxed to [facility] and placed in medical records by [facility]

## 2017-11-22 NOTE — ED Provider Notes (Signed)
[facility] EMERGENCY DEPARTMENT Provider Note   CSN: [id number] Arrival date & time: 11/22/17  0111     History   Chief Complaint Chief Complaint  Patient presents with  . Drug Overdose  . Suicide Attempt    HPI Jacqueline Clarke is a 32 y.o. female.  HPI  The patient is a 32 year old female, she has a reported history of bipolar disorder as well as a reported history of suicide attempts, she is brought into the hospital today after the family members had seen some Facebook post stating that she was going to end her life today and she was going to do it intentionally by taking an overdose of medications.  She was found this evening at the car wash, she was avoiding people, she was tearful, the patient reports that she has had an argument with her significant other with whom she lives with him and her kids.  She does not go into any more details.  She denies being physically assaulted.  She endorses taking 20 tablets of ibuprofen and 10 tablets of iron.  She states that she had vomited and was nauseated about 15 minutes after the event.  The actual overdose occurred at approximately 10:00 PM, 3-1/2 hours ago.  Past Medical History:  Diagnosis Date  . Anemia   . Anxiety   . Asthma   . Blood type, Rh negative   . Cholelithiasis   . Chronic back pain    scoliosis- on percocet  . Depression   . Headache(784.0)   . Hyperemesis complicating pregnancy, antepartum 2013  . 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 Obesity   . Pregnancy induced hypertension   . Scoliosis     Patient Active Problem List   Diagnosis Date Noted  . Obesity affecting pregnancy, antepartum 11/06/2017  . Supervision of high risk pregnancy, antepartum 10/07/2017  . Traumatic injury during pregnancy 09/28/2017  . Depression, postpartum 07/09/2016  . Adjustment insomnia 07/09/2016  . History of ileus 07/06/2016  . Pyelonephritis 07/03/2016  . History of gestational  hypertension 06/30/2016  . Rh negative status during pregnancy   . Morbid obesity (HCC)   . Low vitamin D level 01/03/2016    Past Surgical History:  Procedure Laterality Date  . arm surgery    . CESAREAN SECTION N/A 06/23/2016   Procedure: CESAREAN SECTION;  Surgeon: Penalosa Bing, MD;  Location: [facility] BIRTHING SU[facility];  Service: Obstetrics;  Laterality: N/A;  . CHOLECYSTECTOMY    . DILATION AND CURETTAGE OF UTERUS  2008  . MR WRIST RIGHT     sx to nerves  . NERVE, TENDON AND ARTERY REPAIR Right 06/18/2014   Procedure: NERVE, TENDON AND ARTERY REPAIR;  Surgeon: Bradly Bienenstock, MD;  Location: [facility] OR;  Service: Orthopedics;  Laterality: Right;  . TONSILLECTOMY AND ADENOIDECTOMY    . VAGINAL DELIVERY  06/23/2016   Procedure: VAGINAL DELIVERY;  Surgeon: Lyons Bing, MD;  Location: [facility]TH[facility] SU[facility];  Service: Obstetrics;;     OB History    Gravida  8   Para  5   Term  4   Preterm  1   AB  2   Living  6     SAB  2   TAB  0   Ectopic  0   Multiple  1   Live Births  6            Home Medications    Prior to Admission medications  Medication Sig Start Date End Date Taking? Authorizing Provider  ferrous sulfate 325 (65 FE) MG EC tablet Take 3,250 mg by mouth once.   Yes [provider]  ibuprofen (ADVIL,MOTRIN) 200 MG tablet Take 4,000 mg by mouth once.   Yes [provider]  acetaminophen (TYLENOL) 500 MG tablet Take 2 tablets (1,000 mg total) by mouth every 6 (six) hours as needed for moderate pain. Patient not taking: Reported on 11/22/2017 09/02/17   Sharyon Cable, CNM  aspirin 81 MG chewable tablet Chew 1 tablet (81 mg total) by mouth daily. Patient not taking: Reported on 10/09/2017 10/07/17   Brock Bad, MD  bacitracin ointment Apply 1 application topically 2 (two) times daily. Patient not taking: Reported on 10/09/2017 10/08/17   Georgetta Haber, NP  Cholecalciferol (VITAMIN D) 2000 units CAPS Take 1 capsule (2,000 Units total) by  mouth daily before breakfast. Patient not taking: Reported on 11/22/2017 10/13/17   Brock Bad, MD  oxyCODONE-acetaminophen (PERCOCET) 10-325 MG tablet Take 1 tablet by mouth every 4 (four) hours as needed for pain. Patient not taking: Reported on 11/22/2017 10/09/17   Brock Bad, MD  Prenat-Fe Poly-Methfol-FA-DHA (VITAFOL ULTRA) 29-0.6-0.4-200 MG CAPS Take 1 capsule by mouth daily before breakfast. Patient not taking: Reported on 11/22/2017 10/07/17   Brock Bad, MD    Family History Family History  Problem Relation Age of Onset  . Heart disease Maternal Grandfather   . Diabetes Maternal Grandfather   . Heart disease Father   . Anesthesia problems Neg Hx     Social History Social History   Tobacco Use  . Smoking status: Former Smoker    Packs/day: 0.25    Types: Cigarettes    Last attempt to quit: 08/02/2017    Years since quitting: 0.3  . Smokeless tobacco: Never Used  Substance Use Topics  . Alcohol use: No    Alcohol/week: 0.0 standard drinks    Comment: Occassionally  . Drug use: No     Allergies   Patient has no known allergies.   Review of Systems Review of Systems  All other systems reviewed and are negative.    Physical Exam Updated Vital Signs BP 112/78   Pulse 93   Temp 99.2 F (37.3 C) (Oral)   Resp 19   LMP 07/22/2017   SpO2 100%   Physical Exam  Constitutional: She appears well-developed and well-nourished. No distress.  HENT:  Head: Normocephalic and atraumatic.  Mouth/Throat: Oropharynx is clear and moist. No oropharyngeal exudate.  Eyes: Pupils are equal, round, and reactive to light. Conjunctivae and EOM are normal. Right eye exhibits no discharge. Left eye exhibits no discharge. No scleral icterus.  Neck: Normal range of motion. Neck supple. No JVD present. No thyromegaly present.  Cardiovascular: Normal rate, regular rhythm, normal heart sounds and intact distal pulses. Exam reveals no gallop and no friction rub.  No  murmur heard. Pulmonary/Chest: Effort normal and breath sounds normal. No respiratory distress. She has no wheezes. She has no rales.  Abdominal: Soft. Bowel sounds are normal. She exhibits no distension and no mass. There is no tenderness.  Musculoskeletal: Normal range of motion. She exhibits no edema or tenderness.  Lymphadenopathy:    She has no cervical adenopathy.  Neurological: She is alert. Coordination normal.  Skin: Skin is warm and dry. No rash noted. No erythema.  Psychiatric: She has a normal mood and affect. Her behavior is normal.  Tearful, mildly anxious, denies suicidal thoughts, denies  hallucinations  Nursing note and vitals reviewed.    ED Treatments / Results  Labs (all labs ordered are listed, but only abnormal results are displayed) Labs Reviewed  COMPREHENSIVE METABOLIC PANEL - Abnormal; Notable for the following components:      Result Value   Potassium 3.4 (*)    Calcium 8.5 (*)    Total Protein 6.4 (*)    Albumin 2.8 (*)    AST 13 (*)    All other components within normal limits  ACETAMINOPHEN LEVEL - Abnormal; Notable for the following components:   Acetaminophen (Tylenol), Serum <10 (*)    All other components within normal limits  CBC - Abnormal; Notable for the following components:   RBC 3.75 (*)    Hemoglobin 10.3 (*)    HCT 33.3 (*)    All other components within normal limits  CBG MONITORING, ED - Abnormal; Notable for the following components:   Glucose-Capillary 66 (*)    All other components within normal limits  I-STAT BETA HCG BLOOD, ED ([facility], [facility], [facility] ONLY) - Abnormal; Notable for the following components:   I-stat hCG, quantitative >2,000.0 (*)    All other components within normal limits  CBG MONITORING, ED - Abnormal; Notable for the following components:   Glucose-Capillary 109 (*)    All other components within normal limits  ETHANOL  SALICYLATE LEVEL  IRON  RAPID URINE DRUG SCREEN, HOSP PERFORMED  POC URINE PREG, ED     EKG EKG Interpretation  Date/Time:  Sunday November 22 2017 01:31:54 EDT Ventricular Rate:  84 PR Interval:    QRS Duration: 92 QT Interval:  381 QTC Calculation: 451 R Axis:   28 Text Interpretation:  Sinus rhythm Normal ECG since last tracing no significant change Confirmed by Eber Hong ([id number]) on 11/22/2017 1:48:20 AM   Radiology No results found.  Procedures Procedures (including critical care time)  Medications Ordered in ED Medications  sodium chloride 0.9 % bolus 1,000 mL (0 mLs Intravenous Stopped 11/22/17 0357)  ondansetron (ZOFRAN) injection 4 mg (4 mg Intravenous Given 11/22/17 0256)     Initial Impression / Assessment and Plan / ED Course  I have reviewed the triage vital signs and the nursing notes.  Pertinent labs & imaging results that were available during my care of the patient were reviewed by me and considered in my medical decision making (see chart for details).     The patient is taken an overdose of things that are not likely to kill her however there could be other coingestants.  The patient does have a history of suicide attempt and while she denies suicide to me she made very blatant comments earlier in the day on Facebook.  Will discuss with psychiatry, IVC papers will be placed.  Change of shift - care signed out to Ddr. Anitra Lauth to follow up psych recommendations  Final Clinical Impressions(s) / ED Diagnoses   Final diagnoses:  None    ED Discharge Orders    None       Eber Hong, MD 11/22/17 [phone]

## 2017-11-22 NOTE — ED Notes (Addendum)
Poison Control is closing out case - recommended to call back if pt becomes symptomatic.

## 2017-11-22 NOTE — Progress Notes (Signed)
Pt has been accepted to [facility] per Reuel Boom in admissions. Pt has been accepted to bed 216A. Admitting MD: Dr. Elmyra Ricks. Number for report: [phone]. Pt can arrive at any time today, 11/22/2017. CSW contacted Jerrol Banana RN to notify of disposition.   Talley Kreiser S. Alan Ripper, MSW, LCSW Clinical Social Worker 11/22/2017 9:57 AM

## 2017-11-22 NOTE — ED Notes (Signed)
Patient belonging inventoried and placed in locker #2-Monique,RN

## 2017-11-22 NOTE — ED Triage Notes (Signed)
Pt presents with [facility] for overdose on 20 advil and 10 iron pills; pt was upset/arguement with significant other; pt with history of suicide attempt in the past and bipolar not currently on medication; +pregnant test (G9,P6); pt states she initially felt lightheaded and vomited 15 mins after ingestion and states she saw some undigested pills

## 2017-11-22 NOTE — ED Notes (Signed)
Pt arrived to F7 - ambulatory - wearing burgundy scrubs. Sitter w/pt. Pt noted to be irritable, cursing at staff - refusing for staff to enter room and refusing for staff to obtain FHT's. Offered for pt eat breakfast - refusing. Offered pt po fluids - initially refused - then given soda as requested.

## 2017-11-22 NOTE — ED Notes (Signed)
Breakfast ray ordered for pt.

## 2017-11-22 NOTE — ED Notes (Signed)
Pt can have visitors at this time per RN. Allow them to go see pt.

## 2017-11-22 NOTE — ED Notes (Signed)
belonings to locker #2

## 2017-11-22 NOTE — ED Notes (Signed)
Pt denies SI/HI, states she only took medication to get a rise out of her significant other who she was arguing with at the time of ingestion

## 2017-11-22 NOTE — ED Notes (Signed)
Poison Control suggestions: labs (CMP, MPAP, Salicylate, ETOH, Preg test, EKG, Iron level) therapies: IV fluid; monitor for metabolic acidosis related to n/v/d

## 2017-11-22 NOTE — Progress Notes (Signed)
Patient meets criteria for inpatient treatment. Per Kenney Houseman, pt is not appropriate for [facility]. CSW faxed referrals to the following facilities for review:  [place], [facility] [place], [place], [facility], [place], [place], [place], [place], [place], [address], [place], [facility], [place], [place]. [place], [place], [place], [place].   TTS will continue to seek bed placement.   Trula Slade, MSW, LCSW Clinical Social Worker 11/22/2017 9:44 AM

## 2017-11-22 NOTE — ED Notes (Addendum)
Pt noted to be much calmer. Pt in agreement for RN to obtain FHT - rate 155. Pt eating breakfast. Pt c/o lower abd pain - denies feels like contractions, denies any vaginal bleeding or drainage.

## 2017-11-22 NOTE — ED Notes (Signed)
Pt in room cursing stating she does not want to be here. Sitter at bedside.

## 2017-11-22 NOTE — ED Notes (Signed)
Pt received breakfast tray 

## 2017-11-22 NOTE — ED Notes (Signed)
Pt accepted to [facility]. Report called to Misty Stanley, Charity fundraiser. Left message for [facility] Department notifying of need for transport. ALL belongings - 1 labeled belongings bag - NO Valuables noted - Deputy - Pt aware.

## 2017-11-22 NOTE — ED Notes (Signed)
Security to wand patient, wine scrubs on

## 2017-11-22 NOTE — ED Notes (Signed)
Lunch tray ordered 

## 2017-11-22 NOTE — ED Notes (Signed)
Deputy called - advised he should be arriving in approx 20 min.

## 2017-11-22 NOTE — BH Assessment (Signed)
Tele Assessment Note   Patient Name: Jacqueline Clarke MRN: [id number] Referring Physician: Dr. Hyacinth Meeker Location of Patient: [facility] Location of Provider: Behavioral Health TTS Department  Jacqueline Clarke is an 32 y.o. female. Pt denies SI but intentionally overdosed on 20 Advil and 10 iron pills. The Pt states "I was only trying to get my boyfriend's attention. The Pt is [redacted] weeks pregnant and states that she has not been receiving prenatal care. Pt denies previous SI attempts but family members report previous SI attempts. Pt denies previous or current mental health care. Pt denies current mental health medication. Pt has previously been diagnosed with bipolar.  Feliz Beam, NP recommends inpatient treatment. TTS to seek placement.   Diagnosis:  F33.2 MDD  Past Medical History:  Past Medical History:  Diagnosis Date  . Anemia   . Anxiety   . Asthma   . Blood type, Rh negative   . Cholelithiasis   . Chronic back pain    scoliosis- on percocet  . Depression   . Headache(784.0)   . Hyperemesis complicating pregnancy, antepartum 2013  . 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 Obesity   . Pregnancy induced hypertension   . Scoliosis     Past Surgical History:  Procedure Laterality Date  . arm surgery    . CESAREAN SECTION N/A 06/23/2016   Procedure: CESAREAN SECTION;  Surgeon: [place] Bing, MD;  Location: [facility] BIRTHING SUITES;  Service: Obstetrics;  Laterality: N/A;  . CHOLECYSTECTOMY    . DILATION AND CURETTAGE OF UTERUS  2008  . MR WRIST RIGHT     sx to nerves  . NERVE, TENDON AND ARTERY REPAIR Right 06/18/2014   Procedure: NERVE, TENDON AND ARTERY REPAIR;  Surgeon: Bradly Bienenstock, MD;  Location: [facility] OR;  Service: Orthopedics;  Laterality: Right;  . TONSILLECTOMY AND ADENOIDECTOMY    . VAGINAL DELIVERY  06/23/2016   Procedure: VAGINAL DELIVERY;  Surgeon: [place] Bing, MD;  Location: [facility] BIRTHING SUITES;  Service: Obstetrics;;    Family History:  Family  History  Problem Relation Age of Onset  . Heart disease Maternal Grandfather   . Diabetes Maternal Grandfather   . Heart disease Father   . Anesthesia problems Neg Hx     Social History:  reports that she quit smoking about 3 months ago. Her smoking use included cigarettes. She smoked 0.25 packs per day. She has never used smokeless tobacco. She reports that she does not drink alcohol or use drugs.  Additional Social History:     CIWA: CIWA-Ar BP: 110/65 Pulse Rate: 83 COWS:    Allergies: No Known Allergies  Home Medications:  (Not in a hospital admission)  OB/GYN Status:  Patient's last menstrual period was 07/22/2017.  General Assessment Data Assessment unable to be completed: Yes Reason for not completing assessment: Multiple assessments ordered simultaneously Location of Assessment: [facility] ED TTS Assessment: In system Is this a Tele or Face-to-Face Assessment?: Tele Assessment Is this an Initial Assessment or a Re-assessment for this encounter?: Initial Assessment Patient Accompanied by:: N/A Language Other than English: No Living Arrangements: Other (Comment) What gender do you identify as?: Female Marital status: Single Maiden name: Edison Pregnancy Status: Yes (Comment: include estimated delivery date)(21 weeks) Living Arrangements: Spouse/significant other, Children Can pt return to current living arrangement?: Yes Admission Status: Involuntary Petitioner: ED Attending Is patient capable of signing voluntary admission?: No Referral Source: Self/Family/Friend Insurance type: Medicaid     Crisis Care Plan Living Arrangements: Spouse/significant other, Children  Legal Guardian: Other:(self) Name of Psychiatrist: NA Name of Therapist: NA  Education Status Is patient currently in school?: No Is the patient employed, unemployed or receiving disability?: Unemployed  Risk to self with the past 6 months Suicidal Ideation: No-Not Currently/Within Last 6  Months Has patient been a risk to self within the past 6 months prior to admission? : No Suicidal Intent: Yes-Currently Present Has patient had any suicidal intent within the past 6 months prior to admission? : No Is patient at risk for suicide?: Yes Suicidal Plan?: Yes-Currently Present Has patient had any suicidal plan within the past 6 months prior to admission? : No Specify Current Suicidal Plan: Pt overdosed Access to Means: Yes Specify Access to Suicidal Means: acess to pills What has been your use of drugs/alcohol within the last 12 months?: NA Previous Attempts/Gestures: No How many times?: 0 Other Self Harm Risks: NA Triggers for Past Attempts: Other personal contacts Intentional Self Injurious Behavior: None Family Suicide History: No Recent stressful life event(s): Conflict (Comment) Persecutory voices/beliefs?: No Depression: Yes Depression Symptoms: Tearfulness, Isolating, Loss of interest in usual pleasures, Feeling worthless/self pity, Feeling angry/irritable Substance abuse history and/or treatment for substance abuse?: No Suicide prevention information given to non-admitted patients: Not applicable  Risk to Others within the past 6 months Homicidal Ideation: No Does patient have any lifetime risk of violence toward others beyond the six months prior to admission? : No Thoughts of Harm to Others: No Current Homicidal Intent: No Current Homicidal Plan: No Access to Homicidal Means: No Identified Victim: NA History of harm to others?: No Assessment of Violence: None Noted Violent Behavior Description: NA Does patient have access to weapons?: No Criminal Charges Pending?: No Does patient have a court date: No Is patient on probation?: No  Psychosis Hallucinations: None noted Delusions: None noted  Mental Status Report Appearance/Hygiene: Unremarkable Eye Contact: Fair Motor Activity: Freedom of movement Speech: Logical/coherent Level of Consciousness:  Alert Mood: Depressed Affect: Depressed Anxiety Level: Minimal Thought Processes: Coherent, Relevant Judgement: Impaired Orientation: Person, Place, Time, Situation Obsessive Compulsive Thoughts/Behaviors: None  Cognitive Functioning Concentration: Normal Memory: Recent Intact, Remote Intact Is patient IDD: No Insight: Fair Impulse Control: Fair Appetite: Fair Have you had any weight changes? : No Change Sleep: No Change Total Hours of Sleep: 0 Vegetative Symptoms: None  ADLScreening [facility] Assessment Services) Patient's cognitive ability adequate to safely complete daily activities?: Yes Patient able to express need for assistance with ADLs?: Yes Independently performs ADLs?: Yes (appropriate for developmental age)  Prior Inpatient Therapy Prior Inpatient Therapy: No  Prior Outpatient Therapy Prior Outpatient Therapy: No Does patient have an ACCT team?: No Does patient have Intensive In-House Services?  : No Does patient have [facility] services? : No Does patient have P4CC services?: No  ADL Screening (condition at time of admission) Patient's cognitive ability adequate to safely complete daily activities?: Yes Is the patient deaf or have difficulty hearing?: No Does the patient have difficulty seeing, even when wearing glasses/contacts?: No Does the patient have difficulty concentrating, remembering, or making decisions?: No Patient able to express need for assistance with ADLs?: Yes Does the patient have difficulty dressing or bathing?: No Independently performs ADLs?: Yes (appropriate for developmental age) Does the patient have difficulty walking or climbing stairs?: No       Abuse/Neglect Assessment (Assessment to be complete while patient is alone) Abuse/Neglect Assessment Can Be Completed: Yes Physical Abuse: Denies Verbal Abuse: Denies Sexual Abuse: Denies Exploitation of patient/patient's resources: Denies  Advance Directives (For Healthcare) Does  Patient Have a Medical Advance Directive?: No Would patient like information on creating a medical advance directive?: No - Patient declined          Disposition:  Disposition Initial Assessment Completed for this Encounter: Yes  This service was provided via telemedicine using a 2-way, interactive audio and video technology.  Names of all persons participating in this telemedicine service and their role in this encounter. Name: Feliz Beam Money Role: NP  Name:  Role:   Name:    Name:  Role:    Emmit Pomfret 11/22/2017 9:09 AM

## 2017-11-22 NOTE — ED Notes (Signed)
Pt's mother-in-law, Vira Agar, called inquiring about pt - per pt's request, advised her pt is resting at this time and FHR was noted. She requested for pt to return her call later.

## 2017-11-22 NOTE — ED Notes (Signed)
Pt sleeping sitter remains at bedside °

## 2017-11-22 NOTE — ED Notes (Addendum)
Attempted to listen to fetal heart tones; patient stated "I don't want y'all fu**ing touch me!" Pt educated on the importance of obtaining fetal heart tones. MD informed and nurse receiving patient Kriste Basque, RN) informed.

## 2017-11-24 ENCOUNTER — Encounter: Payer: Self-pay | Admitting: Obstetrics

## 2017-11-24 ENCOUNTER — Telehealth: Payer: Self-pay

## 2017-11-24 NOTE — Progress Notes (Signed)
Ok per physican to provide letter to [facility] regarding depression medication to take.

## 2017-11-24 NOTE — Telephone Encounter (Signed)
TC from pt who is currently at [facility] due to bipolar/depression The hospital wanted to know. Medication that would be ok during pregnancy  Consulted w/ provider and letter was faxed letting them know Zoloft would be ok to take  Letter faxed.

## 2017-12-02 ENCOUNTER — Ambulatory Visit (INDEPENDENT_AMBULATORY_CARE_PROVIDER_SITE_OTHER): Payer: Medicaid Other | Admitting: Obstetrics and Gynecology

## 2017-12-02 ENCOUNTER — Encounter: Payer: Self-pay | Admitting: Obstetrics and Gynecology

## 2017-12-02 VITALS — BP 120/83 | HR 93 | Wt 291.0 lb

## 2017-12-02 DIAGNOSIS — O0992 Supervision of high risk pregnancy, unspecified, second trimester: Secondary | ICD-10-CM

## 2017-12-02 DIAGNOSIS — O26892 Other specified pregnancy related conditions, second trimester: Secondary | ICD-10-CM

## 2017-12-02 DIAGNOSIS — O99210 Obesity complicating pregnancy, unspecified trimester: Secondary | ICD-10-CM

## 2017-12-02 DIAGNOSIS — Z6791 Unspecified blood type, Rh negative: Secondary | ICD-10-CM

## 2017-12-02 DIAGNOSIS — O0990 Supervision of high risk pregnancy, unspecified, unspecified trimester: Secondary | ICD-10-CM

## 2017-12-02 DIAGNOSIS — F419 Anxiety disorder, unspecified: Secondary | ICD-10-CM

## 2017-12-02 DIAGNOSIS — Z8759 Personal history of other complications of pregnancy, childbirth and the puerperium: Secondary | ICD-10-CM

## 2017-12-02 DIAGNOSIS — R443 Hallucinations, unspecified: Secondary | ICD-10-CM

## 2017-12-02 DIAGNOSIS — O99212 Obesity complicating pregnancy, second trimester: Secondary | ICD-10-CM
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
## 2017-12-08 ENCOUNTER — Encounter (HOSPITAL_COMMUNITY): Payer: Self-pay

## 2017-12-09 ENCOUNTER — Encounter (HOSPITAL_COMMUNITY): Payer: Self-pay

## 2017-12-09 ENCOUNTER — Ambulatory Visit (HOSPITAL_COMMUNITY)
Admission: RE | Admit: 2017-12-09 | Discharge: 2017-12-09 | Disposition: A | Discharge status: Home / Self Care | Payer: Medicaid Other | Source: Ambulatory Visit | Attending: Obstetrics and Gynecology | Admitting: Obstetrics and Gynecology

## 2017-12-09 ENCOUNTER — Other Ambulatory Visit (HOSPITAL_COMMUNITY): Payer: Self-pay | Admitting: *Deleted

## 2017-12-09 DIAGNOSIS — O9A219 Injury, poisoning and certain other consequences of external causes complicating pregnancy, unspecified trimester: Secondary | ICD-10-CM

## 2017-12-09 DIAGNOSIS — O0992 Supervision of high risk pregnancy, unspecified, second trimester: Secondary | ICD-10-CM | POA: Diagnosis not present

## 2017-12-09 DIAGNOSIS — Z3A23 23 weeks gestation of pregnancy: Secondary | ICD-10-CM | POA: Insufficient documentation

## 2017-12-09 DIAGNOSIS — O283 Abnormal ultrasonic finding on antenatal screening of mother: Secondary | ICD-10-CM

## 2017-12-09 DIAGNOSIS — O0990 Supervision of high risk pregnancy, unspecified, unspecified trimester: Secondary | ICD-10-CM

## 2017-12-09 DIAGNOSIS — O99342 Other mental disorders complicating pregnancy, second trimester: Secondary | ICD-10-CM

## 2017-12-09 DIAGNOSIS — O99212 Obesity complicating pregnancy, second trimester: Secondary | ICD-10-CM

## 2017-12-09 DIAGNOSIS — O09292 Supervision of pregnancy with other poor reproductive or obstetric history, second trimester: Secondary | ICD-10-CM

## 2017-12-09 DIAGNOSIS — Z6791 Unspecified blood type, Rh negative: Secondary | ICD-10-CM

## 2017-12-09 DIAGNOSIS — Z363 Encounter for antenatal screening for malformations: Secondary | ICD-10-CM | POA: Insufficient documentation

## 2017-12-09 DIAGNOSIS — O26892 Other specified pregnancy related conditions, second trimester: Secondary | ICD-10-CM

## 2017-12-09 DIAGNOSIS — O99210 Obesity complicating pregnancy, unspecified trimester: Secondary | ICD-10-CM

## 2017-12-09 DIAGNOSIS — O99322 Drug use complicating pregnancy, second trimester: Secondary | ICD-10-CM

## 2017-12-09 HISTORY — DX: Bipolar disorder, unspecified: F31.9

## 2017-12-31 ENCOUNTER — Encounter: Payer: Medicaid Other | Admitting: Obstetrics and Gynecology

## 2017-12-31 ENCOUNTER — Encounter (HOSPITAL_COMMUNITY): Payer: Self-pay

## 2017-12-31 ENCOUNTER — Inpatient Hospital Stay (HOSPITAL_COMMUNITY)
Admission: AD | Admit: 2017-12-31 | Discharge: 2017-12-31 | Disposition: A | Discharge status: Home / Self Care | Payer: Medicaid Other | Source: Ambulatory Visit | Attending: Obstetrics and Gynecology | Admitting: Obstetrics and Gynecology

## 2017-12-31 DIAGNOSIS — R102 Pelvic and perineal pain unspecified side: Secondary | ICD-10-CM

## 2017-12-31 DIAGNOSIS — O368120 Decreased fetal movements, second trimester, not applicable or unspecified: Secondary | ICD-10-CM | POA: Insufficient documentation

## 2017-12-31 DIAGNOSIS — O9A212 Injury, poisoning and certain other consequences of external causes complicating pregnancy, second trimester: Secondary | ICD-10-CM

## 2017-12-31 DIAGNOSIS — R109 Unspecified abdominal pain: Secondary | ICD-10-CM | POA: Insufficient documentation

## 2017-12-31 DIAGNOSIS — O26892 Other specified pregnancy related conditions, second trimester: Secondary | ICD-10-CM

## 2017-12-31 DIAGNOSIS — Z3A26 26 weeks gestation of pregnancy: Secondary | ICD-10-CM

## 2017-12-31 DIAGNOSIS — W19XXXA Unspecified fall, initial encounter: Secondary | ICD-10-CM

## 2017-12-31 DIAGNOSIS — R42 Dizziness and giddiness: Secondary | ICD-10-CM | POA: Diagnosis not present

## 2017-12-31 DIAGNOSIS — O368130 Decreased fetal movements, third trimester, not applicable or unspecified: Secondary | ICD-10-CM

## 2017-12-31 DIAGNOSIS — R51 Headache: Secondary | ICD-10-CM | POA: Diagnosis not present

## 2017-12-31 DIAGNOSIS — O26899 Other specified pregnancy related conditions, unspecified trimester: Secondary | ICD-10-CM

## 2017-12-31 DIAGNOSIS — R519 Headache, unspecified: Secondary | ICD-10-CM

## 2017-12-31 DIAGNOSIS — Z87891 Personal history of nicotine dependence: Secondary | ICD-10-CM | POA: Diagnosis not present

## 2017-12-31 LAB — COMPREHENSIVE METABOLIC PANEL
ALBUMIN: 2.6 g/dL — AB (ref 3.5–5.0)
ALT: 8 U/L (ref 0–44)
AST: 12 U/L — ABNORMAL LOW (ref 15–41)
Alkaline Phosphatase: 78 U/L (ref 38–126)
Anion gap: 9 (ref 5–15)
BILIRUBIN TOTAL: 0.6 mg/dL (ref 0.3–1.2)
BUN: 10 mg/dL (ref 6–20)
CALCIUM: 8.4 mg/dL — AB (ref 8.9–10.3)
CO2: 21 mmol/L — ABNORMAL LOW (ref 22–32)
Chloride: 107 mmol/L (ref 98–111)
Creatinine, Ser: 0.56 mg/dL (ref 0.44–1.00)
GFR calc Af Amer: >60 mL/min (ref >60)
GFR calc non Af Amer: >60 mL/min (ref >60)
GLUCOSE: 86 mg/dL (ref 70–99)
Potassium: 3.6 mmol/L (ref 3.5–5.1)
Sodium: 137 mmol/L (ref 135–145)
Total Protein: 6.2 g/dL — ABNORMAL LOW (ref 6.5–8.1)

## 2017-12-31 LAB — CBC
HCT: 29.8 % — ABNORMAL LOW (ref 36.0–46.0)
Hemoglobin: 9.3 g/dL — ABNORMAL LOW (ref 12.0–15.0)
MCH: 27.2 pg (ref 26.0–34.0)
MCHC: 31.2 g/dL (ref 30.0–36.0)
MCV: 87.1 fL (ref 80.0–100.0)
NRBC: 0 % (ref 0.0–0.2)
Platelets: 210 10*3/uL (ref 150–400)
RBC: 3.42 MIL/uL — ABNORMAL LOW (ref 3.87–5.11)
RDW: 14 % (ref 11.5–15.5)
WBC: 9.9 10*3/uL (ref 4.0–10.5)

## 2017-12-31 LAB — URINALYSIS, ROUTINE W REFLEX MICROSCOPIC
Bilirubin Urine: NEGATIVE
GLUCOSE, UA: NEGATIVE mg/dL
HGB URINE DIPSTICK: NEGATIVE
Ketones, ur: 5 mg/dL — AB
LEUKOCYTES UA: NEGATIVE
Nitrite: NEGATIVE
PH: 6 (ref 5.0–8.0)
Protein, ur: NEGATIVE mg/dL
Specific Gravity, Urine: 1.03 (ref 1.005–1.030)

## 2017-12-31 LAB — LIPASE, BLOOD: Lipase: 29 U/L (ref 11–51)

## 2017-12-31 MED ORDER — DOCUSATE SODIUM 100 MG PO CAPS: 100.0000 mg | ORAL_CAPSULE | Freq: Two times a day (BID) | ORAL | 2 refills | Status: DC | PRN

## 2017-12-31 MED ORDER — BUTALBITAL-APAP-CAFFEINE 50-325-40 MG PO TABS
2.0000 | ORAL_TABLET | Freq: Once | ORAL | Status: AC
  Administered 2017-12-31: 2 via ORAL
  Filled 2017-12-31: qty 2

## 2017-12-31 MED ORDER — FERROUS FUMARATE 324 (106 FE) MG PO TABS: 1.0000 | ORAL_TABLET | Freq: Every day | ORAL | 5 refills | Status: DC

## 2017-12-31 MED ORDER — DIPHENHYDRAMINE HCL 50 MG/ML IJ SOLN
25.0000 mg | Freq: Once | INTRAMUSCULAR | Status: AC
  Administered 2017-12-31: 25 mg via INTRAVENOUS
  Filled 2017-12-31: qty 1

## 2017-12-31 MED ORDER — METOCLOPRAMIDE HCL 5 MG/ML IJ SOLN
10.0000 mg | Freq: Once | INTRAMUSCULAR | Status: AC
  Administered 2017-12-31: 10 mg via INTRAVENOUS
  Filled 2017-12-31: qty 2

## 2017-12-31 MED ORDER — DEXAMETHASONE SODIUM PHOSPHATE 10 MG/ML IJ SOLN
10.0000 mg | Freq: Once | INTRAMUSCULAR | Status: AC
  Administered 2017-12-31: 10 mg via INTRAVENOUS
  Filled 2017-12-31: qty 1

## 2017-12-31 MED ORDER — LACTATED RINGERS IV BOLUS (SEPSIS)
1000.0000 mL | Freq: Once | INTRAVENOUS | Status: AC
  Administered 2017-12-31: 1000 mL via INTRAVENOUS

## 2017-12-31 MED ORDER — BUTALBITAL-APAP-CAFFEINE 50-325-40 MG PO TABS: 1.0000 | ORAL_TABLET | Freq: Four times a day (QID) | ORAL | 0 refills | Status: DC | PRN
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
## 2018-01-04 ENCOUNTER — Ambulatory Visit (INDEPENDENT_AMBULATORY_CARE_PROVIDER_SITE_OTHER): Payer: Medicaid Other | Admitting: Obstetrics

## 2018-01-04 VITALS — BP 123/84 | HR 77 | Wt 302.2 lb

## 2018-01-04 DIAGNOSIS — O99212 Obesity complicating pregnancy, second trimester: Secondary | ICD-10-CM

## 2018-01-04 DIAGNOSIS — O0992 Supervision of high risk pregnancy, unspecified, second trimester: Secondary | ICD-10-CM

## 2018-01-04 DIAGNOSIS — O0990 Supervision of high risk pregnancy, unspecified, unspecified trimester: Secondary | ICD-10-CM

## 2018-01-04 DIAGNOSIS — O99210 Obesity complicating pregnancy, unspecified trimester: Secondary | ICD-10-CM

## 2018-01-04 DIAGNOSIS — M549 Dorsalgia, unspecified: Secondary | ICD-10-CM

## 2018-01-04 DIAGNOSIS — Z8759 Personal history of other complications of pregnancy, childbirth and the puerperium: Secondary | ICD-10-CM

## 2018-01-04 MED ORDER — COMFORT FIT MATERNITY SUPP SM MISC: 0 refills | Status: DC
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
## 2018-01-06 ENCOUNTER — Ambulatory Visit (HOSPITAL_COMMUNITY)
Admission: RE | Admit: 2018-01-06 | Discharge: 2018-01-06 | Disposition: A | Discharge status: Home / Self Care | Payer: Medicaid Other | Source: Ambulatory Visit | Attending: Obstetrics and Gynecology | Admitting: Obstetrics and Gynecology

## 2018-01-06 ENCOUNTER — Encounter (HOSPITAL_COMMUNITY): Payer: Self-pay

## 2018-01-06 ENCOUNTER — Other Ambulatory Visit (HOSPITAL_COMMUNITY): Payer: Self-pay | Admitting: *Deleted

## 2018-01-06 DIAGNOSIS — F319 Bipolar disorder, unspecified: Secondary | ICD-10-CM | POA: Diagnosis not present

## 2018-01-06 DIAGNOSIS — O09292 Supervision of pregnancy with other poor reproductive or obstetric history, second trimester: Secondary | ICD-10-CM | POA: Insufficient documentation

## 2018-01-06 DIAGNOSIS — Z362 Encounter for other antenatal screening follow-up: Secondary | ICD-10-CM

## 2018-01-06 DIAGNOSIS — O99212 Obesity complicating pregnancy, second trimester: Secondary | ICD-10-CM | POA: Diagnosis not present

## 2018-01-06 DIAGNOSIS — Z363 Encounter for antenatal screening for malformations: Secondary | ICD-10-CM | POA: Insufficient documentation

## 2018-01-06 DIAGNOSIS — O99342 Other mental disorders complicating pregnancy, second trimester: Secondary | ICD-10-CM

## 2018-01-06 DIAGNOSIS — F1490 Cocaine use, unspecified, uncomplicated: Principal | ICD-10-CM

## 2018-01-06 DIAGNOSIS — O99320 Drug use complicating pregnancy, unspecified trimester: Secondary | ICD-10-CM

## 2018-01-06 DIAGNOSIS — O283 Abnormal ultrasonic finding on antenatal screening of mother: Secondary | ICD-10-CM

## 2018-01-06 DIAGNOSIS — Z3A27 27 weeks gestation of pregnancy: Secondary | ICD-10-CM | POA: Insufficient documentation

## 2018-01-06 DIAGNOSIS — O99322 Drug use complicating pregnancy, second trimester: Secondary | ICD-10-CM | POA: Insufficient documentation

## 2018-01-20 NOTE — L&D Delivery Note (Signed)
Patient: Jacqueline Clarke MRN: [id number]  GBS status: negative, IAP given: not indicated  Patient is a 33 y.o. now [id number] s/p NSVD at [redacted]w[redacted]d, who was admitted for SOL. SROM 2h 29m prior to delivery with clear fluid.    Delivery Note At 8:41 AM a viable female was delivered via Vaginal, Spontaneous (Presentation: cephalic; ROA).  APGAR: 9, 9; weight 8 lb 3.6 oz (3731 g).   Placenta status: intact, 3-vessel cord.  Cord:  with the following complications: none.  Cord pH: not collected  Head delivered ROA. No nuchal cord present. Shoulder dystocia lasting less than 1 minute resolved by delivery of the posterior arm.  Anterior shoulder and body delivered easily after shoulder. Infant with spontaneous cry, placed on mother's abdomen, dried and bulb suctioned. Cord clamped x 2 after 1-minute delay, and cut by family member. Cord blood drawn. Placenta delivered spontaneously with gentle cord traction. Fundus firm with massage and Pitocin. Perineum inspected and found to have a 1st degree perineal laceration, which was found to be hemostatic.  Anesthesia:  None Episiotomy: None Lacerations: 1st degree Suture Repair: None Est. Blood Loss (mL): 187  Mom to postpartum.  Baby to Couplet care / Skin to Skin.  Gwenevere Abbot 03/31/2018, 10:31 AM

## 2018-01-21 ENCOUNTER — Other Ambulatory Visit: Payer: Medicaid Other

## 2018-01-21 ENCOUNTER — Encounter: Payer: Medicaid Other | Admitting: Obstetrics

## 2018-02-02 ENCOUNTER — Other Ambulatory Visit: Payer: Medicaid Other

## 2018-02-02 ENCOUNTER — Encounter: Payer: Self-pay | Admitting: Obstetrics

## 2018-02-02 ENCOUNTER — Ambulatory Visit (INDEPENDENT_AMBULATORY_CARE_PROVIDER_SITE_OTHER): Payer: Medicaid Other | Admitting: Obstetrics

## 2018-02-02 VITALS — BP 121/82 | HR 81 | Wt 300.2 lb

## 2018-02-02 DIAGNOSIS — O99340 Other mental disorders complicating pregnancy, unspecified trimester: Secondary | ICD-10-CM

## 2018-02-02 DIAGNOSIS — F329 Major depressive disorder, single episode, unspecified: Secondary | ICD-10-CM

## 2018-02-02 DIAGNOSIS — O99343 Other mental disorders complicating pregnancy, third trimester: Secondary | ICD-10-CM

## 2018-02-02 DIAGNOSIS — O0993 Supervision of high risk pregnancy, unspecified, third trimester: Secondary | ICD-10-CM

## 2018-02-02 DIAGNOSIS — K219 Gastro-esophageal reflux disease without esophagitis: Secondary | ICD-10-CM

## 2018-02-02 DIAGNOSIS — F32A Depression, unspecified: Secondary | ICD-10-CM

## 2018-02-02 DIAGNOSIS — O360930 Maternal care for other rhesus isoimmunization, third trimester, not applicable or unspecified: Secondary | ICD-10-CM | POA: Diagnosis not present

## 2018-02-02 DIAGNOSIS — O0990 Supervision of high risk pregnancy, unspecified, unspecified trimester: Secondary | ICD-10-CM

## 2018-02-02 MED ORDER — RHO D IMMUNE GLOBULIN 1500 UNIT/2ML IJ SOSY
300.0000 ug | PREFILLED_SYRINGE | Freq: Once | INTRAMUSCULAR | Status: AC
  Administered 2018-02-02: 300 ug via INTRAMUSCULAR

## 2018-02-02 MED ORDER — VITAFOL ULTRA 29-0.6-0.4-200 MG PO CAPS: 1.0000 | ORAL_CAPSULE | Freq: Every day | ORAL | 4 refills | Status: DC

## 2018-02-02 MED ORDER — OMEPRAZOLE 20 MG PO CPDR: 20.0000 mg | DELAYED_RELEASE_CAPSULE | Freq: Two times a day (BID) | ORAL | 5 refills | Status: DC

## 2018-02-02 MED ORDER — SERTRALINE HCL 25 MG PO TABS: 25.0000 mg | ORAL_TABLET | Freq: Every day | ORAL | 11 refills | Status: DC

## 2018-02-02 NOTE — Progress Notes (Signed)
Pt presents for ROB, 2 gtt labs, and Rhogam. Pt needs rf on Zoloft and PNV's. Tdap deferred til NV per pt's request.

## 2018-02-02 NOTE — Progress Notes (Signed)
Subjective:  Jacqueline Clarke is a 33 y.o. [id number] at [redacted]w[redacted]d being seen today for ongoing prenatal care.  She is currently monitored for the following issues for this high-risk pregnancy and has Low vitamin D level; Rh negative status during pregnancy; Morbid obesity (HCC); History of gestational hypertension; Pyelonephritis; History of ileus; Depression, postpartum; Adjustment insomnia; Traumatic injury during pregnancy; Supervision of high risk pregnancy, antepartum; and Obesity affecting pregnancy, antepartum on their problem list.  Patient reports heartburn.  Contractions: Irritability. Vag. Bleeding: None.  Movement: Present. Denies leaking of fluid.   The following portions of the patient's history were reviewed and updated as appropriate: allergies, current medications, past family history, past medical history, past social history, past surgical history and problem list. Problem list updated.  Objective:   Vitals:   02/02/18 0940  BP: 121/82  Pulse: 81  Weight: (!) 300 lb 3.2 oz (136.2 kg)    Fetal Status:     Movement: Present     General:  Alert, oriented and cooperative. Patient is in no acute distress.  Skin: Skin is warm and dry. No rash noted.   Cardiovascular: Normal heart rate noted  Respiratory: Normal respiratory effort, no problems with respiration noted  Abdomen: Soft, gravid, appropriate for gestational age. Pain/Pressure: Present     Pelvic:  Cervical exam deferred        Extremities: Normal range of motion.  Edema: Trace  Mental Status: Normal mood and affect. Normal behavior. Normal judgment and thought content.   Urinalysis:      Assessment and Plan:  Pregnancy: [id number] at [redacted]w[redacted]d  1. Supervision of high risk pregnancy, antepartum Rx: - Glucose Tolerance, 2 Hours w/1 Hour - CBC - RPR - HIV Antibody (routine testing w rflx) - Prenat-Fe Poly-Methfol-FA-DHA (VITAFOL ULTRA) 29-0.6-0.4-200 MG CAPS; Take 1 capsule by mouth daily before breakfast.  Dispense:  90 capsule; Refill: 4  2. Depression affecting pregnancy Rx: - sertraline (ZOLOFT) 25 MG tablet; Take 1 tablet (25 mg total) by mouth daily.  Dispense: 30 tablet; Refill: 11  3. GERD without esophagitis Rx: - omeprazole (PRILOSEC) 20 MG capsule; Take 1 capsule (20 mg total) by mouth 2 (two) times daily before a meal.  Dispense: 60 capsule; Refill: 5  Preterm labor symptoms and general obstetric precautions including but not limited to vaginal bleeding, contractions, leaking of fluid and fetal movement were reviewed in detail with the patient. Please refer to After Visit Summary for other counseling recommendations.  Return in about 2 weeks (around 02/16/2018) for ROB.   Brock Bad, MD

## 2018-02-03 ENCOUNTER — Ambulatory Visit (HOSPITAL_COMMUNITY)
Admission: RE | Admit: 2018-02-03 | Discharge: 2018-02-03 | Disposition: A | Discharge status: Home / Self Care | Payer: Medicaid Other | Source: Ambulatory Visit | Attending: Obstetrics and Gynecology | Admitting: Obstetrics and Gynecology

## 2018-02-03 ENCOUNTER — Encounter (HOSPITAL_COMMUNITY): Payer: Self-pay

## 2018-02-03 ENCOUNTER — Other Ambulatory Visit: Payer: Self-pay | Admitting: Obstetrics

## 2018-02-03 ENCOUNTER — Other Ambulatory Visit (HOSPITAL_COMMUNITY): Payer: Self-pay | Admitting: *Deleted

## 2018-02-03 DIAGNOSIS — O99343 Other mental disorders complicating pregnancy, third trimester: Secondary | ICD-10-CM | POA: Diagnosis not present

## 2018-02-03 DIAGNOSIS — O99323 Drug use complicating pregnancy, third trimester: Secondary | ICD-10-CM | POA: Diagnosis not present

## 2018-02-03 DIAGNOSIS — F1490 Cocaine use, unspecified, uncomplicated: Secondary | ICD-10-CM | POA: Insufficient documentation

## 2018-02-03 DIAGNOSIS — O99213 Obesity complicating pregnancy, third trimester: Secondary | ICD-10-CM | POA: Diagnosis not present

## 2018-02-03 DIAGNOSIS — O99320 Drug use complicating pregnancy, unspecified trimester: Secondary | ICD-10-CM

## 2018-02-03 DIAGNOSIS — Z3A31 31 weeks gestation of pregnancy: Secondary | ICD-10-CM

## 2018-02-03 DIAGNOSIS — O34219 Maternal care for unspecified type scar from previous cesarean delivery: Secondary | ICD-10-CM

## 2018-02-03 DIAGNOSIS — O09293 Supervision of pregnancy with other poor reproductive or obstetric history, third trimester: Secondary | ICD-10-CM

## 2018-02-03 DIAGNOSIS — O99019 Anemia complicating pregnancy, unspecified trimester: Secondary | ICD-10-CM

## 2018-02-03 DIAGNOSIS — Z363 Encounter for antenatal screening for malformations: Secondary | ICD-10-CM

## 2018-02-03 DIAGNOSIS — O09213 Supervision of pregnancy with history of pre-term labor, third trimester: Secondary | ICD-10-CM

## 2018-02-03 LAB — CBC
Hematocrit: 30.8 % — ABNORMAL LOW (ref 34.0–46.6)
Hemoglobin: 9.6 g/dL — ABNORMAL LOW (ref 11.1–15.9)
MCH: 26.5 pg — ABNORMAL LOW (ref 26.6–33.0)
MCHC: 31.2 g/dL — ABNORMAL LOW (ref 31.5–35.7)
MCV: 85 fL (ref 79–97)
Platelets: 228 10*3/uL (ref 150–450)
RBC: 3.62 x10E6/uL — ABNORMAL LOW (ref 3.77–5.28)
RDW: 13 % (ref 11.7–15.4)
WBC: 6.9 10*3/uL (ref 3.4–10.8)

## 2018-02-03 LAB — GLUCOSE TOLERANCE, 2 HOURS W/ 1HR
GLUCOSE, 2 HOUR: 105 mg/dL (ref 65–152)
Glucose, 1 hour: 163 mg/dL (ref 65–179)
Glucose, Fasting: 89 mg/dL (ref 65–91)

## 2018-02-03 LAB — RPR: RPR Ser Ql: NONREACTIVE

## 2018-02-03 LAB — HIV ANTIBODY (ROUTINE TESTING W REFLEX): HIV Screen 4th Generation wRfx: NONREACTIVE

## 2018-02-03 MED ORDER — FERROUS SULFATE 325 (65 FE) MG PO TABS: 325.0000 mg | ORAL_TABLET | Freq: Two times a day (BID) | ORAL | 5 refills | Status: DC

## 2018-02-15 ENCOUNTER — Other Ambulatory Visit: Payer: Self-pay | Admitting: Obstetrics

## 2018-02-15 DIAGNOSIS — O99019 Anemia complicating pregnancy, unspecified trimester: Secondary | ICD-10-CM

## 2018-02-15 MED ORDER — FERROUS SULFATE 325 (65 FE) MG PO TABS: 325.0000 mg | ORAL_TABLET | Freq: Two times a day (BID) | ORAL | 5 refills | Status: DC

## 2018-02-16 ENCOUNTER — Ambulatory Visit (INDEPENDENT_AMBULATORY_CARE_PROVIDER_SITE_OTHER): Payer: Medicaid Other | Admitting: Obstetrics

## 2018-02-16 ENCOUNTER — Encounter: Payer: Self-pay | Admitting: Obstetrics

## 2018-02-16 VITALS — BP 118/78 | HR 82 | Temp 99.1°F | Wt 309.0 lb

## 2018-02-16 DIAGNOSIS — Z8759 Personal history of other complications of pregnancy, childbirth and the puerperium: Secondary | ICD-10-CM

## 2018-02-16 DIAGNOSIS — F329 Major depressive disorder, single episode, unspecified: Secondary | ICD-10-CM

## 2018-02-16 DIAGNOSIS — O0993 Supervision of high risk pregnancy, unspecified, third trimester: Secondary | ICD-10-CM

## 2018-02-16 DIAGNOSIS — F32A Depression, unspecified: Secondary | ICD-10-CM

## 2018-02-16 DIAGNOSIS — O0990 Supervision of high risk pregnancy, unspecified, unspecified trimester: Secondary | ICD-10-CM

## 2018-02-16 DIAGNOSIS — O99340 Other mental disorders complicating pregnancy, unspecified trimester: Secondary | ICD-10-CM

## 2018-02-16 DIAGNOSIS — O219 Vomiting of pregnancy, unspecified: Secondary | ICD-10-CM

## 2018-02-16 DIAGNOSIS — O99210 Obesity complicating pregnancy, unspecified trimester: Secondary | ICD-10-CM

## 2018-02-16 DIAGNOSIS — O99213 Obesity complicating pregnancy, third trimester: Secondary | ICD-10-CM

## 2018-02-16 DIAGNOSIS — O99343 Other mental disorders complicating pregnancy, third trimester: Secondary | ICD-10-CM

## 2018-02-16 DIAGNOSIS — G44011 Episodic cluster headache, intractable: Secondary | ICD-10-CM

## 2018-02-16 MED ORDER — PROMETHAZINE HCL 25 MG PO TABS: 25.0000 mg | ORAL_TABLET | Freq: Four times a day (QID) | ORAL | 1 refills | Status: DC | PRN

## 2018-02-16 MED ORDER — BUTALBITAL-APAP-CAFFEINE 50-325-40 MG PO TABS: 2.0000 | ORAL_TABLET | Freq: Four times a day (QID) | ORAL | 1 refills | Status: DC | PRN

## 2018-02-16 NOTE — Progress Notes (Signed)
Subjective:  Jacqueline Clarke is a 33 y.o. [id number] at [redacted]w[redacted]d being seen today for ongoing prenatal care.  She is currently monitored for the following issues for this high-risk pregnancy and has Low vitamin D level; Rh negative status during pregnancy; Morbid obesity (HCC); History of gestational hypertension; Pyelonephritis; History of ileus; Depression, postpartum; Adjustment insomnia; Traumatic injury during pregnancy; Supervision of high risk pregnancy, antepartum; and Obesity affecting pregnancy, antepartum on their problem list.  Patient reports headache, nausea and occasional contractions.  Contractions: Irregular. Vag. Bleeding: None.  Movement: Present. Denies leaking of fluid.   The following portions of the patient's history were reviewed and updated as appropriate: allergies, current medications, past family history, past medical history, past social history, past surgical history and problem list. Problem list updated.  Objective:   Vitals:   02/16/18 1523  BP: 118/78  Pulse: 82  Temp: 99.1 F (37.3 C)  Weight: (!) 309 lb (140.2 kg)    Fetal Status:     Movement: Present     General:  Alert, oriented and cooperative. Patient is in no acute distress.  Skin: Skin is warm and dry. No rash noted.   Cardiovascular: Normal heart rate noted  Respiratory: Normal respiratory effort, no problems with respiration noted  Abdomen: Soft, gravid, appropriate for gestational age. Pain/Pressure: Present     Pelvic:  Cervical exam deferred        Extremities: Normal range of motion.     Mental Status: Normal mood and affect. Normal behavior. Normal judgment and thought content.   Urinalysis:      Assessment and Plan:  Pregnancy: [id number] at [redacted]w[redacted]d  1. Supervision of high risk pregnancy, antepartum  2. History of gestational hypertension  3. Depression affecting pregnancy - stable  4. Obesity affecting pregnancy, antepartum  5. Nausea and vomiting in pregnancy Rx: -  promethazine (PHENERGAN) 25 MG tablet; Take 1 tablet (25 mg total) by mouth every 6 (six) hours as needed for nausea.  Dispense: 30 tablet; Refill: 1  6. Intractable episodic cluster headache Rx: - butalbital-acetaminophen-caffeine (FIORICET, ESGIC) 50-325-40 MG tablet; Take 2 tablets by mouth every 6 (six) hours as needed for headache.  Dispense: 30 tablet; Refill: 1   Preterm labor symptoms and general obstetric precautions including but not limited to vaginal bleeding, contractions, leaking of fluid and fetal movement were reviewed in detail with the patient. Please refer to After Visit Summary for other counseling recommendations.  Return in about 2 weeks (around 03/02/2018) for ROB.   Brock Bad, MD

## 2018-02-17 ENCOUNTER — Encounter (HOSPITAL_COMMUNITY): Payer: Self-pay

## 2018-02-17 ENCOUNTER — Other Ambulatory Visit: Payer: Self-pay

## 2018-02-17 ENCOUNTER — Telehealth: Payer: Self-pay

## 2018-02-17 ENCOUNTER — Inpatient Hospital Stay (HOSPITAL_COMMUNITY)
Admission: AD | Admit: 2018-02-17 | Discharge: 2018-02-18 | Disposition: A | Discharge status: Home / Self Care | Payer: Medicaid Other | Source: Ambulatory Visit | Attending: Obstetrics & Gynecology | Admitting: Obstetrics & Gynecology

## 2018-02-17 DIAGNOSIS — R51 Headache: Secondary | ICD-10-CM | POA: Insufficient documentation

## 2018-02-17 DIAGNOSIS — Z87891 Personal history of nicotine dependence: Secondary | ICD-10-CM | POA: Diagnosis not present

## 2018-02-17 DIAGNOSIS — Z3A33 33 weeks gestation of pregnancy: Secondary | ICD-10-CM | POA: Diagnosis not present

## 2018-02-17 DIAGNOSIS — O26899 Other specified pregnancy related conditions, unspecified trimester: Secondary | ICD-10-CM

## 2018-02-17 DIAGNOSIS — R109 Unspecified abdominal pain: Secondary | ICD-10-CM | POA: Diagnosis present

## 2018-02-17 DIAGNOSIS — R102 Pelvic and perineal pain: Secondary | ICD-10-CM | POA: Diagnosis not present

## 2018-02-17 DIAGNOSIS — O26893 Other specified pregnancy related conditions, third trimester: Secondary | ICD-10-CM

## 2018-02-17 LAB — CBC WITH DIFFERENTIAL/PLATELET
Basophils Absolute: 0 10*3/uL (ref 0.0–0.1)
Basophils Relative: 0 %
EOS ABS: 0.1 10*3/uL (ref 0.0–0.5)
Eosinophils Relative: 2 %
HCT: 27.8 % — ABNORMAL LOW (ref 36.0–46.0)
Hemoglobin: 8.8 g/dL — ABNORMAL LOW (ref 12.0–15.0)
Lymphocytes Relative: 20 %
Lymphs Abs: 1.6 10*3/uL (ref 0.7–4.0)
MCH: 26.7 pg (ref 26.0–34.0)
MCHC: 31.7 g/dL (ref 30.0–36.0)
MCV: 84.2 fL (ref 80.0–100.0)
MONO ABS: 0.5 10*3/uL (ref 0.1–1.0)
Monocytes Relative: 6 %
Neutro Abs: 5.7 10*3/uL (ref 1.7–7.7)
Neutrophils Relative %: 72 %
Platelets: 210 10*3/uL (ref 150–400)
RBC: 3.3 MIL/uL — ABNORMAL LOW (ref 3.87–5.11)
RDW: 13.8 % (ref 11.5–15.5)
WBC: 7.9 10*3/uL (ref 4.0–10.5)
nRBC: 0 % (ref 0.0–0.2)

## 2018-02-17 NOTE — MAU Note (Signed)
Pt states that 2 days ago she started having mild cramping around her belly button.    Pt states now she is having a really bad pain around belly button and on right side. Pt states it hurts worse when she walks.   Denies vaginal bleeding. Denies LOF.   Pt reports decreased fetal movement yesterday and today.

## 2018-02-17 NOTE — Telephone Encounter (Signed)
Advised that provider sent rx refill, pt agreed.

## 2018-02-17 NOTE — MAU Provider Note (Signed)
Chief Complaint:  Abdominal Pain and Headache   First Provider Initiated Contact with Patient 02/17/18 2334     HPI: Jacqueline Clarke is a 33 y.o. [id number] at [redacted]w[redacted]d who presents to maternity admissions reporting abdominal pain & headache. Reports abdominal pain around her belly button & in her right lower quadrant. Pain is worse with standing and walking at work Wear a maternity support belt at work. Denies fever/chills, LOF, vaginal bleeding. Headaches recently. Was prescribed fioricet by her ob yesterday. Has been taking it every 6 hours without relief.   Location: head Quality: aching Severity: 9/10 in pain scale Duration: 2 days Timing: constant Modifying factors: worse with lights. Not improved with fioricet Associated signs and symptoms: none   Past Medical History:  Diagnosis Date  . Anemia   . Anxiety   . Asthma   . Bipolar disorder (HCC)   . Blood type, Rh negative   . Cholelithiasis   . Chronic back pain    scoliosis- on percocet  . Depression   . Headache(784.0)   . Hyperemesis complicating pregnancy, antepartum 2013  . 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 Obesity   . Pregnancy induced hypertension   . Scoliosis    OB History  Gravida Para Term Preterm AB Living  8 5 4 1 2 6   SAB TAB Ectopic Multiple Live Births  2 0 0 1 6    # Outcome Date GA Lbr Len/2nd Weight Sex Delivery Anes PTL Lv  8 Current           7A Preterm 06/23/16 [redacted]w[redacted]d 04:17 / 00:42 2570 g M Vag-Spont EPI  LIV  7B Preterm 06/23/16 [redacted]w[redacted]d 04:17 / 01:10 2380 g M CS-LTranv EPI  LIV  6 Term 06/22/15 [redacted]w[redacted]d 07:35 / 00:18 4130 g F Vag-Spont EPI  LIV  5 Term 07/22/11 [redacted]w[redacted]d 03:24 / 00:15 4065 g F Vag-Spont EPI  LIV     Birth Comments: WNL  4 Term 2011 [redacted]w[redacted]d 04:00 3118 g M Vag-Spont EPI  LIV  3 Term 2010 [redacted]w[redacted]d  3345 g F Vag-Spont EPI  LIV  2 SAB 2009          1 SAB 2006           Past Surgical History:  Procedure Laterality Date  . arm surgery    . CESAREAN SECTION N/A 06/23/2016   Procedure: CESAREAN SECTION;  Surgeon: [place] Bing, MD;  Location: [facility] BIRTHING SUITES;  Service: Obstetrics;  Laterality: N/A;  . CHOLECYSTECTOMY    . DILATION AND CURETTAGE OF UTERUS  2008  . MR WRIST RIGHT     sx to nerves  . NERVE, TENDON AND ARTERY REPAIR Right 06/18/2014   Procedure: NERVE, TENDON AND ARTERY REPAIR;  Surgeon: Bradly Bienenstock, MD;  Location: [facility] OR;  Service: Orthopedics;  Laterality: Right;  . TONSILLECTOMY AND ADENOIDECTOMY    . VAGINAL DELIVERY  06/23/2016   Procedure: VAGINAL DELIVERY;  Surgeon: [place] Bing, MD;  Location: [facility]IRTHING SUITES;  Service: Obstetrics;;   Family History  Problem Relation Age of Onset  . Heart disease Maternal Grandfather   . Diabetes Maternal Grandfather   . Heart disease Father   . Anesthesia problems Neg Hx    Social History   Tobacco Use  . Smoking status: Former Smoker    Packs/day: 0.25    Types: Cigarettes    Last attempt to quit: 08/02/2017    Years since quitting: 0.5  . Smokeless tobacco: Never Used  Substance Use Topics  . Alcohol use: No    Alcohol/week: 0.0 standard drinks    Comment: Occassionally  . Drug use: No   No Known Allergies No medications prior to admission.    I have reviewed patient's Past Medical Hx, Surgical Hx, Family Hx, Social Hx, medications and allergies.   ROS:  Review of Systems  Constitutional: Negative.   Gastrointestinal: Positive for abdominal pain. Negative for constipation, diarrhea, nausea and vomiting.  Genitourinary: Negative.   Neurological: Positive for headaches.    Physical Exam   Patient Vitals for the past 24 hrs:  BP Temp Pulse Resp SpO2  02/18/18 0141 122/71 - 85 - -  02/17/18 2302 126/69 98.8 F (37.1 C) (!) 101 20 97 %    Constitutional: Well-developed, well-nourished female in no acute distress.  Cardiovascular: normal rate & rhythm, no murmur Respiratory: normal effort, lung sounds clear throughout GI: Abd soft, non-tender, gravid appropriate for  gestational age. Pos BS x 4 MS: Extremities nontender, no edema, normal ROM Neurologic: Alert and oriented x 4.  GU:  Dilation: Closed Effacement (%): Thick Cervical Position: Posterior Exam by:: Judeth HornErin Estephany Perot, NP  NST:  Baseline: 135 bpm, Variability: Good {> 6 bpm), Accelerations: Reactive and Decelerations: Absent   Labs: Results for orders placed or performed during the hospital encounter of 02/17/18 (from the past 24 hour(s))  CBC with Differential/Platelet     Status: Abnormal   Collection Time: 02/17/18 11:35 PM  Result Value Ref Range   WBC 7.9 4.0 - 10.5 K/uL   RBC 3.30 (L) 3.87 - 5.11 MIL/uL   Hemoglobin 8.8 (L) 12.0 - 15.0 g/dL   HCT 16.127.8 (L) 09.636.0 - 04.546.0 %   MCV 84.2 80.0 - 100.0 fL   MCH 26.7 26.0 - 34.0 pg   MCHC 31.7 30.0 - 36.0 g/dL   RDW 40.913.8 81.111.5 - 91.415.5 %   Platelets 210 150 - 400 K/uL   nRBC 0.0 0.0 - 0.2 %   Neutrophils Relative % 72 %   Neutro Abs 5.7 1.7 - 7.7 K/uL   Lymphocytes Relative 20 %   Lymphs Abs 1.6 0.7 - 4.0 K/uL   Monocytes Relative 6 %   Monocytes Absolute 0.5 0.1 - 1.0 K/uL   Eosinophils Relative 2 %   Eosinophils Absolute 0.1 0.0 - 0.5 K/uL   Basophils Relative 0 %   Basophils Absolute 0.0 0.0 - 0.1 K/uL  Urinalysis, Routine w reflex microscopic     Status: Abnormal   Collection Time: 02/18/18 12:04 AM  Result Value Ref Range   Color, Urine YELLOW YELLOW   APPearance CLEAR CLEAR   Specific Gravity, Urine 1.026 1.005 - 1.030   pH 6.0 5.0 - 8.0   Glucose, UA NEGATIVE NEGATIVE mg/dL   Hgb urine dipstick NEGATIVE NEGATIVE   Bilirubin Urine NEGATIVE NEGATIVE   Ketones, ur NEGATIVE NEGATIVE mg/dL   Protein, ur NEGATIVE NEGATIVE mg/dL   Nitrite NEGATIVE NEGATIVE   Leukocytes, UA TRACE (A) NEGATIVE   RBC / HPF 0-5 0 - 5 RBC/hpf   WBC, UA 0-5 0 - 5 WBC/hpf   Bacteria, UA RARE (A) NONE SEEN   Squamous Epithelial / LPF 6-10 0 - 5   Mucus PRESENT   Rapid urine drug screen (hospital performed)     Status: Abnormal   Collection Time:  02/18/18 12:04 AM  Result Value Ref Range   Opiates NONE DETECTED NONE DETECTED   Cocaine NONE DETECTED NONE DETECTED   Benzodiazepines NONE DETECTED NONE DETECTED  Amphetamines NONE DETECTED NONE DETECTED   Tetrahydrocannabinol NONE DETECTED NONE DETECTED   Barbiturates POSITIVE (A) NONE DETECTED    Imaging:  No results found.  MAU Course: Orders Placed This Encounter  Procedures  . CBC with Differential/Platelet  . Urinalysis, Routine w reflex microscopic  . Rapid urine drug screen (hospital performed)  . Insert peripheral IV  . Discharge patient   Meds ordered this encounter  Medications  . AND Linked Order Group   . 0.9 %  sodium chloride infusion   . diphenhydrAMINE (BENADRYL) injection 25 mg   . dexamethasone (DECADRON) injection 10 mg  . promethazine (PHENERGAN) injection 25 mg  . metoCLOPramide (REGLAN) 10 MG tablet    Sig: Take 1 tablet (10 mg total) by mouth every 8 (eight) hours as needed.    Dispense:  30 tablet    Refill:  0    Order Specific Question:   Supervising Provider    Answer:   Despina HiddenEURE, LUTHER H [[id number]]  . cyclobenzaprine (FLEXERIL) 5 MG tablet    Sig: Take 1 tablet (5 mg total) by mouth 3 (three) times daily as needed for muscle spasms.    Dispense:  20 tablet    Refill:  0    Order Specific Question:   Supervising Provider    Answer:   Lazaro ArmsEURE, LUTHER H [[id number]]    MDM: Abdominal pain likely round ligament pain based on description. Abdomen soft. No rebound tenderness. No leukocytosis & pt afebrile. Cervix closed/thick. No ctx on monitor.   Headaches possibly exacerbated by fioricet use. Headache improved with IV headache cocktail in MAU. Pt instructed to d/c fioricet. Will rx reglan to be used with tylenol.  Pt normotensive.   Assessment: 1. Pain of round ligament affecting pregnancy, antepartum   2. [redacted] weeks gestation of pregnancy   3. Headache in pregnancy, antepartum, third trimester     Plan: Discharge home in stable condition.  Discussed  reasons to return to MAU   Allergies as of 02/18/2018   No Known Allergies     Medication List    STOP taking these medications   butalbital-acetaminophen-caffeine 50-325-40 MG tablet Commonly known as:  FIORICET, ESGIC   ferrous sulfate 325 (65 FE) MG tablet   promethazine 25 MG tablet Commonly known as:  PHENERGAN     TAKE these medications   aspirin 81 MG chewable tablet Chew 1 tablet (81 mg total) by mouth daily.   COMFORT FIT MATERNITY SUPP SM Misc Wear as directed.   cyclobenzaprine 5 MG tablet Commonly known as:  FLEXERIL Take 1 tablet (5 mg total) by mouth 3 (three) times daily as needed for muscle spasms.   docusate sodium 100 MG capsule Commonly known as:  COLACE Take 1 capsule (100 mg total) by mouth 2 (two) times daily as needed.   Ferrous Fumarate 324 (106 Fe) MG Tabs tablet Commonly known as:  HEMOCYTE - 106 mg FE Take 1 tablet (106 mg of iron total) by mouth daily.   metoCLOPramide 10 MG tablet Commonly known as:  REGLAN Take 1 tablet (10 mg total) by mouth every 8 (eight) hours as needed.   omeprazole 20 MG capsule Commonly known as:  PRILOSEC Take 1 capsule (20 mg total) by mouth 2 (two) times daily before a meal.   sertraline 25 MG tablet Commonly known as:  ZOLOFT Take 1 tablet (25 mg total) by mouth daily.   VITAFOL ULTRA 29-0.6-0.4-200 MG Caps Take 1 capsule by mouth daily before breakfast.   Vitamin  D 50 MCG (2000 UT) Caps Take 1 capsule (2,000 Units total) by mouth daily before breakfast.       [name], NP 02/18/2018 2:19 AM

## 2018-02-18 LAB — RAPID URINE DRUG SCREEN, HOSP PERFORMED
AMPHETAMINES: NOT DETECTED
Barbiturates: POSITIVE — AB
Benzodiazepines: NOT DETECTED
Cocaine: NOT DETECTED
Opiates: NOT DETECTED
Tetrahydrocannabinol: NOT DETECTED

## 2018-02-18 LAB — URINALYSIS, ROUTINE W REFLEX MICROSCOPIC
Bilirubin Urine: NEGATIVE
GLUCOSE, UA: NEGATIVE mg/dL
Hgb urine dipstick: NEGATIVE
Ketones, ur: NEGATIVE mg/dL
Nitrite: NEGATIVE
PH: 6 (ref 5.0–8.0)
Protein, ur: NEGATIVE mg/dL
Specific Gravity, Urine: 1.026 (ref 1.005–1.030)

## 2018-02-18 MED ORDER — PROMETHAZINE HCL 25 MG/ML IJ SOLN
25.0000 mg | Freq: Once | INTRAMUSCULAR | Status: AC
  Administered 2018-02-18: 25 mg via INTRAVENOUS
  Filled 2018-02-18: qty 1

## 2018-02-18 MED ORDER — SODIUM CHLORIDE 0.9 % IV SOLN
INTRAVENOUS | Status: DC
  Administered 2018-02-18: 01:00:00 via INTRAVENOUS

## 2018-02-18 MED ORDER — CYCLOBENZAPRINE HCL 5 MG PO TABS: 5.0000 mg | ORAL_TABLET | Freq: Three times a day (TID) | ORAL | 0 refills | Status: DC | PRN

## 2018-02-18 MED ORDER — METOCLOPRAMIDE HCL 10 MG PO TABS: 10.0000 mg | ORAL_TABLET | Freq: Three times a day (TID) | ORAL | 0 refills | Status: DC | PRN

## 2018-02-18 MED ORDER — DIPHENHYDRAMINE HCL 50 MG/ML IJ SOLN
25.0000 mg | Freq: Once | INTRAMUSCULAR | Status: AC
  Administered 2018-02-18: 25 mg via INTRAVENOUS
  Filled 2018-02-18: qty 1

## 2018-02-18 MED ORDER — DEXAMETHASONE SODIUM PHOSPHATE 10 MG/ML IJ SOLN
10.0000 mg | Freq: Once | INTRAMUSCULAR | Status: AC
  Administered 2018-02-18: 10 mg via INTRAVENOUS
  Filled 2018-02-18: qty 1

## 2018-02-18 NOTE — Discharge Instructions (Signed)
Fetal Movement Counts Patient Name: ________________________________________________ Patient Due Date: ____________________ What is a fetal movement count?  A fetal movement count is the number of times that you feel your baby move during a certain amount of time. This may also be called a fetal kick count. A fetal movement count is recommended for every pregnant woman. You may be asked to start counting fetal movements as early as week 28 of your pregnancy. Pay attention to when your baby is most active. You may notice your baby's sleep and wake cycles. You may also notice things that make your baby move more. You should do a fetal movement count:  When your baby is normally most active.  At the same time each day. A good time to count movements is while you are resting, after having something to eat and drink. How do I count fetal movements? 1. Find a quiet, comfortable area. Sit, or lie down on your side. 2. Write down the date, the start time and stop time, and the number of movements that you felt between those two times. Take this information with you to your health care visits. 3. For 2 hours, count kicks, flutters, swishes, rolls, and jabs. You should feel at least 10 movements during 2 hours. 4. You may stop counting after you have felt 10 movements. 5. If you do not feel 10 movements in 2 hours, have something to eat and drink. Then, keep resting and counting for 1 hour. If you feel at least 4 movements during that hour, you may stop counting. Contact a health care provider if:  You feel fewer than 4 movements in 2 hours.  Your baby is not moving like he or she usually does. Date: ____________ Start time: ____________ Stop time: ____________ Movements: ____________ Date: ____________ Start time: ____________ Stop time: ____________ Movements: ____________ Date: ____________ Start time: ____________ Stop time: ____________ Movements: ____________ Date: ____________ Start time:  ____________ Stop time: ____________ Movements: ____________ Date: ____________ Start time: ____________ Stop time: ____________ Movements: ____________ Date: ____________ Start time: ____________ Stop time: ____________ Movements: ____________ Date: ____________ Start time: ____________ Stop time: ____________ Movements: ____________ Date: ____________ Start time: ____________ Stop time: ____________ Movements: ____________ Date: ____________ Start time: ____________ Stop time: ____________ Movements: ____________ This information is not intended to replace advice given to you by your health care provider. Make sure you discuss any questions you have with your health care provider. Document Released: 02/05/2006 Document Revised: 09/05/2015 Document Reviewed: 02/15/2015 Elsevier Interactive Patient Education  2019 [employer] General Headache Without Cause A headache is pain or discomfort felt around the head or neck area. The specific cause of a headache may not be found. There are many causes and types of headaches. A few common ones are:  Tension headaches.  Migraine headaches.  Cluster headaches.  Chronic daily headaches. Follow these instructions at home: Watch your condition for any changes. Let your health care provider know about them. Take these steps to help with your condition: Managing pain      Take over-the-counter and prescription medicines only as told by your health care provider.  Lie down in a dark, quiet room when you have a headache.  If directed, put ice on your head and neck area: ? Put ice in a plastic bag. ? Place a towel between your skin and the bag. ? Leave the ice on for 20 minutes, 2-3 times per day.  If directed, apply heat to the affected area. Use the heat source that your health  care provider recommends, such as a moist heat pack or a heating pad. ? Place a towel between your skin and the heat source. ? Leave the heat on for 20-30  minutes. ? Remove the heat if your skin turns bright red. This is especially important if you are unable to feel pain, heat, or cold. You may have a greater risk of getting burned.  Keep lights dim if bright lights bother you or make your headaches worse. Eating and drinking  Eat meals on a regular schedule.  If you drink alcohol: ? Limit how much you use to:  0-1 drink a day for women.  0-2 drinks a day for men. ? Be aware of how much alcohol is in your drink. In the [place] one drink equals one 12 oz bottle of beer (355 mL), one 5 oz glass of wine (148 mL), or one 1 oz glass of hard liquor (44 mL).  Stop drinking caffeine, or decrease the amount of caffeine you drink. General instructions   Keep a headache journal to help find out what may trigger your headaches. For example, write down: ? What you eat and drink. ? How much sleep you get. ? Any change to your diet or medicines.  Try massage or other relaxation techniques.  Limit stress.  Sit up straight, and do not tense your muscles.  Do not use any products that contain nicotine or tobacco, such as cigarettes, e-cigarettes, and chewing tobacco. If you need help quitting, ask your health care provider.  Exercise regularly as told by your health care provider.  Sleep on a regular schedule. Get 7-9 hours of sleep each night, or the amount recommended by your health care provider.  Keep all follow-up visits as told by your health care provider. This is important. Contact a health care provider if:  Your symptoms are not helped by medicine.  You have a headache that is different from the usual headache.  You have nausea or you vomit.  You have a fever. Get help right away if:  Your headache becomes severe quickly.  Your headache gets worse after moderate to intense physical activity.  You have repeated vomiting.  You have a stiff neck.  You have a loss of vision.  You have problems with speech.  You have  pain in the eye or ear.  You have muscular weakness or loss of muscle control.  You lose your balance or have trouble walking.  You feel faint or pass out.  You have confusion.  You have a seizure. Summary  A headache is pain or discomfort felt around the head or neck area.  There are many causes and types of headaches. In some cases, the cause may not be found.  Keep a headache journal to help find out what may trigger your headaches. Watch your condition for any changes. Let your health care provider know about them.  Contact a health care provider if you have a headache that is different from the usual headache, or if your symptoms are not helped by medicine.  Get help right away if your headache becomes severe, you vomit, you have a loss of vision, you lose your balance, or you have a seizure. This information is not intended to replace advice given to you by your health care provider. Make sure you discuss any questions you have with your health care provider. Document Released: 01/06/2005 Document Revised: 07/27/2017 Document Reviewed: 07/27/2017 Elsevier Interactive Patient Education  2019 [employer]. Round Ligament Pain  The round ligament is a cord of muscle and tissue that helps support the uterus. It can become a source of pain during pregnancy if it becomes stretched or twisted as the baby grows. The pain usually begins in the second trimester (13-28 weeks) of pregnancy, and it can come and go until the baby is delivered. It is not a serious problem, and it does not cause harm to the baby. Round ligament pain is usually a short, sharp, and pinching pain, but it can also be a dull, lingering, and aching pain. The pain is felt in the lower side of the abdomen or in the groin. It usually starts deep in the groin and moves up to the outside of the hip area. The pain may occur when you:  Suddenly change position, such as quickly going from a sitting to standing  position.  Roll over in bed.  Cough or sneeze.  Do physical activity. Follow these instructions at home:   Watch your condition for any changes.  When the pain starts, relax. Then try any of these methods to help with the pain: ? Sitting down. ? Flexing your knees up to your abdomen. ? Lying on your side with one pillow under your abdomen and another pillow between your legs. ? Sitting in a warm bath for 15-20 minutes or until the pain goes away.  Take over-the-counter and prescription medicines only as told by your health care provider.  Move slowly when you sit down or stand up.  Avoid long walks if they cause pain.  Stop or reduce your physical activities if they cause pain.  Keep all follow-up visits as told by your health care provider. This is important. Contact a health care provider if:  Your pain does not go away with treatment.  You feel pain in your back that you did not have before.  Your medicine is not helping. Get help right away if:  You have a fever or chills.  You develop uterine contractions.  You have vaginal bleeding.  You have nausea or vomiting.  You have diarrhea.  You have pain when you urinate. Summary  Round ligament pain is felt in the lower abdomen or groin. It is usually a short, sharp, and pinching pain. It can also be a dull, lingering, and aching pain.  This pain usually begins in the second trimester (13-28 weeks). It occurs because the uterus is stretching with the growing baby, and it is not harmful to the baby.  You may notice the pain when you suddenly change position, when you cough or sneeze, or during physical activity.  Relaxing, flexing your knees to your abdomen, lying on one side, or taking a warm bath may help to get rid of the pain.  Get help from your health care provider if the pain does not go away or if you have vaginal bleeding, nausea, vomiting, diarrhea, or painful urination. This information is not  intended to replace advice given to you by your health care provider. Make sure you discuss any questions you have with your health care provider. Document Released: 10/16/2007 Document Revised: 06/24/2017 Document Reviewed: 06/24/2017 Elsevier Interactive Patient Education  2019 [employer]

## 2018-03-01 ENCOUNTER — Encounter (HOSPITAL_COMMUNITY): Payer: Self-pay

## 2018-03-02 ENCOUNTER — Encounter: Payer: Medicaid Other | Admitting: Obstetrics

## 2018-03-03 ENCOUNTER — Ambulatory Visit (HOSPITAL_COMMUNITY)
Admission: RE | Admit: 2018-03-03 | Discharge: 2018-03-03 | Disposition: A | Discharge status: Home / Self Care | Payer: Medicaid Other | Source: Ambulatory Visit | Attending: Obstetrics and Gynecology | Admitting: Obstetrics and Gynecology

## 2018-03-03 ENCOUNTER — Encounter (HOSPITAL_COMMUNITY): Payer: Self-pay

## 2018-03-03 DIAGNOSIS — O99343 Other mental disorders complicating pregnancy, third trimester: Secondary | ICD-10-CM | POA: Diagnosis not present

## 2018-03-03 DIAGNOSIS — O99320 Drug use complicating pregnancy, unspecified trimester: Secondary | ICD-10-CM | POA: Diagnosis not present

## 2018-03-03 DIAGNOSIS — O99323 Drug use complicating pregnancy, third trimester: Secondary | ICD-10-CM | POA: Diagnosis not present

## 2018-03-03 DIAGNOSIS — O34219 Maternal care for unspecified type scar from previous cesarean delivery: Secondary | ICD-10-CM

## 2018-03-03 DIAGNOSIS — O09213 Supervision of pregnancy with history of pre-term labor, third trimester: Secondary | ICD-10-CM

## 2018-03-03 DIAGNOSIS — F1490 Cocaine use, unspecified, uncomplicated: Secondary | ICD-10-CM

## 2018-03-03 DIAGNOSIS — O09293 Supervision of pregnancy with other poor reproductive or obstetric history, third trimester: Secondary | ICD-10-CM

## 2018-03-03 DIAGNOSIS — O99213 Obesity complicating pregnancy, third trimester: Secondary | ICD-10-CM | POA: Diagnosis not present

## 2018-03-03 DIAGNOSIS — Z3A35 35 weeks gestation of pregnancy: Secondary | ICD-10-CM

## 2018-03-03 HISTORY — DX: Personal history of other specified conditions: Z87.898

## 2018-03-03 HISTORY — DX: Cocaine use, unspecified, in remission: F14.91

## 2018-03-09 ENCOUNTER — Encounter: Payer: Medicaid Other | Admitting: Obstetrics

## 2018-03-11 ENCOUNTER — Encounter (HOSPITAL_COMMUNITY): Payer: Self-pay | Admitting: *Deleted

## 2018-03-11 ENCOUNTER — Ambulatory Visit (INDEPENDENT_AMBULATORY_CARE_PROVIDER_SITE_OTHER): Payer: Medicaid Other | Admitting: Obstetrics

## 2018-03-11 ENCOUNTER — Encounter: Payer: Self-pay | Admitting: Obstetrics

## 2018-03-11 ENCOUNTER — Inpatient Hospital Stay (HOSPITAL_COMMUNITY)
Admission: AD | Admit: 2018-03-11 | Discharge: 2018-03-11 | Disposition: A | Discharge status: Home / Self Care | Payer: Medicaid Other | Attending: Obstetrics and Gynecology | Admitting: Obstetrics and Gynecology

## 2018-03-11 VITALS — BP 122/83 | HR 86 | Wt 312.0 lb

## 2018-03-11 DIAGNOSIS — O99210 Obesity complicating pregnancy, unspecified trimester: Secondary | ICD-10-CM

## 2018-03-11 DIAGNOSIS — G43911 Migraine, unspecified, intractable, with status migrainosus: Secondary | ICD-10-CM | POA: Diagnosis not present

## 2018-03-11 DIAGNOSIS — O99353 Diseases of the nervous system complicating pregnancy, third trimester: Secondary | ICD-10-CM | POA: Diagnosis not present

## 2018-03-11 DIAGNOSIS — O26893 Other specified pregnancy related conditions, third trimester: Secondary | ICD-10-CM

## 2018-03-11 DIAGNOSIS — Z3A36 36 weeks gestation of pregnancy: Secondary | ICD-10-CM

## 2018-03-11 DIAGNOSIS — G43909 Migraine, unspecified, not intractable, without status migrainosus: Secondary | ICD-10-CM | POA: Insufficient documentation

## 2018-03-11 DIAGNOSIS — Z87891 Personal history of nicotine dependence: Secondary | ICD-10-CM | POA: Insufficient documentation

## 2018-03-11 DIAGNOSIS — O0993 Supervision of high risk pregnancy, unspecified, third trimester: Secondary | ICD-10-CM

## 2018-03-11 DIAGNOSIS — O99213 Obesity complicating pregnancy, third trimester: Secondary | ICD-10-CM

## 2018-03-11 DIAGNOSIS — O0990 Supervision of high risk pregnancy, unspecified, unspecified trimester: Secondary | ICD-10-CM

## 2018-03-11 DIAGNOSIS — O9A219 Injury, poisoning and certain other consequences of external causes complicating pregnancy, unspecified trimester: Secondary | ICD-10-CM

## 2018-03-11 DIAGNOSIS — G43009 Migraine without aura, not intractable, without status migrainosus: Secondary | ICD-10-CM

## 2018-03-11 DIAGNOSIS — Z6791 Unspecified blood type, Rh negative: Secondary | ICD-10-CM

## 2018-03-11 DIAGNOSIS — O9A213 Injury, poisoning and certain other consequences of external causes complicating pregnancy, third trimester: Secondary | ICD-10-CM

## 2018-03-11 DIAGNOSIS — O26892 Other specified pregnancy related conditions, second trimester: Secondary | ICD-10-CM

## 2018-03-11 DIAGNOSIS — O360130 Maternal care for anti-D [Rh] antibodies, third trimester, not applicable or unspecified: Secondary | ICD-10-CM | POA: Diagnosis not present

## 2018-03-11 LAB — URINALYSIS, ROUTINE W REFLEX MICROSCOPIC
Bilirubin Urine: NEGATIVE
Glucose, UA: NEGATIVE mg/dL
Hgb urine dipstick: NEGATIVE
Ketones, ur: NEGATIVE mg/dL
Nitrite: NEGATIVE
Protein, ur: NEGATIVE mg/dL
Specific Gravity, Urine: 1.025 (ref 1.005–1.030)
pH: 6.5 (ref 5.0–8.0)

## 2018-03-11 LAB — URINALYSIS, MICROSCOPIC (REFLEX): RBC / HPF: NONE SEEN RBC/hpf (ref 0–5)

## 2018-03-11 MED ORDER — MORPHINE SULFATE (PF) 4 MG/ML IV SOLN
2.0000 mg | Freq: Once | INTRAVENOUS | Status: AC
  Administered 2018-03-11: 2 mg via INTRAVENOUS
  Filled 2018-03-11: qty 1

## 2018-03-11 MED ORDER — DEXAMETHASONE SODIUM PHOSPHATE 10 MG/ML IJ SOLN
10.0000 mg | Freq: Once | INTRAMUSCULAR | Status: AC
  Administered 2018-03-11: 10 mg via INTRAVENOUS
  Filled 2018-03-11: qty 1

## 2018-03-11 MED ORDER — METOCLOPRAMIDE HCL 5 MG/ML IJ SOLN
10.0000 mg | Freq: Once | INTRAMUSCULAR | Status: AC
  Administered 2018-03-11: 10 mg via INTRAVENOUS
  Filled 2018-03-11: qty 2

## 2018-03-11 MED ORDER — LACTATED RINGERS IV SOLN
INTRAVENOUS | Status: DC
  Administered 2018-03-11: 18:00:00 via INTRAVENOUS

## 2018-03-11 MED ORDER — SODIUM CHLORIDE 0.9 % IV SOLN
2.0000 g | Freq: Once | INTRAVENOUS | Status: AC
  Administered 2018-03-11: 2 g via INTRAVENOUS
  Filled 2018-03-11: qty 4

## 2018-03-11 MED ORDER — DIPHENHYDRAMINE HCL 50 MG/ML IJ SOLN
25.0000 mg | Freq: Once | INTRAMUSCULAR | Status: AC
  Administered 2018-03-11: 25 mg via INTRAVENOUS
  Filled 2018-03-11: qty 1

## 2018-03-11 MED ORDER — LACTATED RINGERS IV BOLUS
1000.0000 mL | Freq: Once | INTRAVENOUS | Status: AC
  Administered 2018-03-11: 1000 mL via INTRAVENOUS

## 2018-03-11 NOTE — MAU Provider Note (Signed)
History     CSN: [id number]  Arrival date and time: 03/11/18 1308   First Provider Initiated Contact with Patient 03/11/18 1411      Chief Complaint  Patient presents with  . Headache  . Fall   Jacqueline Clarke is a 33 y.o. [id number] at [redacted]w[redacted]d who presents for Headache and Fall.  She states that her "migraine" started about 3 days ago and she has been taking fiorcet without relief.  She decribes the pain as 'feeling like my head is about to explode."  She also reports lightheadedness, dizziness, and "seeing little gray spots."  She states she took fiorcet today around 11am without relief.  She rates her headache as a 9/10 and denies improvement methods, but states sitting up makes it worse.  She also reports that she fell yesterday around 1230 when trying to get up.  She states she called the office and was seen this morning.  She denies VB, LoF, and vaginal discharge of concern.  She endorses fetal movement and cramping with pressure, but denies contractions.  She reports that she has ate breakfast and lunch, while consuming 2 bottles of water.       OB History    Gravida  8   Para  5   Term  4   Preterm  1   AB  2   Living  6     SAB  2   TAB  0   Ectopic  0   Multiple  1   Live Births  6           Past Medical History:  Diagnosis Date  . Anemia   . Anxiety   . Asthma   . Bipolar disorder (HCC)   . Blood type, Rh negative   . Cholelithiasis   . Chronic back pain    scoliosis- on percocet  . Depression   . Headache(784.0)   . History of cocaine use   . Hyperemesis complicating pregnancy, antepartum 2013  . 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 Obesity   . Pregnancy induced hypertension   . Scoliosis     Past Surgical History:  Procedure Laterality Date  . arm surgery    . CESAREAN SECTION N/A 06/23/2016   Procedure: CESAREAN SECTION;  Surgeon: [place] Bing, MD;  Location: [facility] BIRTHING SUITES;  Service: Obstetrics;  Laterality:  N/A;  . CHOLECYSTECTOMY    . DILATION AND CURETTAGE OF UTERUS  2008  . MR WRIST RIGHT     sx to nerves  . NERVE, TENDON AND ARTERY REPAIR Right 06/18/2014   Procedure: NERVE, TENDON AND ARTERY REPAIR;  Surgeon: Bradly Bienenstock, MD;  Location: [facility] OR;  Service: Orthopedics;  Laterality: Right;  . TONSILLECTOMY AND ADENOIDECTOMY    . VAGINAL DELIVERY  06/23/2016   Procedure: VAGINAL DELIVERY;  Surgeon: [place] Bing, MD;  Location: [facility] BIRTHING SUITES;  Service: Obstetrics;;    Family History  Problem Relation Age of Onset  . Heart disease Maternal Grandfather   . Diabetes Maternal Grandfather   . Heart disease Father   . Anesthesia problems Neg Hx     Social History   Tobacco Use  . Smoking status: Former Smoker    Packs/day: 0.25    Types: Cigarettes    Last attempt to quit: 08/02/2017    Years since quitting: 0.6  . Smokeless tobacco: Never Used  Substance Use Topics  . Alcohol use: No    Alcohol/week: 0.0 standard  drinks    Comment: Occassionally  . Drug use: Yes    Types: Cocaine    Comment: UDS + 11/22/17    Allergies: No Known Allergies  Medications Prior to Admission  Medication Sig Dispense Refill Last Dose  . aspirin 81 MG chewable tablet Chew 1 tablet (81 mg total) by mouth daily. (Patient not taking: Reported on 10/09/2017) 30 tablet 5 Not Taking  . Cholecalciferol (VITAMIN D) 2000 units CAPS Take 1 capsule (2,000 Units total) by mouth daily before breakfast. (Patient not taking: Reported on 11/22/2017) 30 capsule 11 Not Taking  . cyclobenzaprine (FLEXERIL) 5 MG tablet Take 1 tablet (5 mg total) by mouth 3 (three) times daily as needed for muscle spasms. 20 tablet 0 Taking  . docusate sodium (COLACE) 100 MG capsule Take 1 capsule (100 mg total) by mouth 2 (two) times daily as needed. (Patient not taking: Reported on 02/02/2018) 30 capsule 2 Not Taking  . Elastic Bandages & Supports (COMFORT FIT MATERNITY SUPP SM) MISC Wear as directed. (Patient not taking: Reported on  02/02/2018) 1 each 0 Not Taking  . Ferrous Fumarate (HEMOCYTE - 106 MG FE) 324 (106 Fe) MG TABS tablet Take 1 tablet (106 mg of iron total) by mouth daily. (Patient not taking: Reported on 02/02/2018) 30 tablet 5 Not Taking  . metoCLOPramide (REGLAN) 10 MG tablet Take 1 tablet (10 mg total) by mouth every 8 (eight) hours as needed. 30 tablet 0 Taking  . omeprazole (PRILOSEC) 20 MG capsule Take 1 capsule (20 mg total) by mouth 2 (two) times daily before a meal. (Patient not taking: Reported on 02/03/2018) 60 capsule 5 Not Taking  . Prenat-Fe Poly-Methfol-FA-DHA (VITAFOL ULTRA) 29-0.6-0.4-200 MG CAPS Take 1 capsule by mouth daily before breakfast. 90 capsule 4 Taking  . sertraline (ZOLOFT) 25 MG tablet Take 1 tablet (25 mg total) by mouth daily. 30 tablet 11 Taking    Review of Systems  Constitutional: Positive for chills. Negative for fever.  Eyes: Positive for photophobia.  Respiratory: Negative for cough and shortness of breath.   Gastrointestinal: Positive for nausea and vomiting. Negative for abdominal pain, constipation and diarrhea.  Neurological: Positive for dizziness, light-headedness and headaches.   Physical Exam   Blood pressure 120/77, pulse 82, temperature 98.4 F (36.9 C), resp. rate 18, height 5\' 11"  (1.803 m), weight (!) 142.9 kg, last menstrual period 07/22/2017, not currently breastfeeding.  Physical Exam  Constitutional: She is oriented to person, place, and time. She appears well-developed and well-nourished.  HENT:  Head: Normocephalic and atraumatic.  Eyes: Conjunctivae are normal.  Neck: Normal range of motion.  Cardiovascular: Normal rate, regular rhythm and normal heart sounds.  Respiratory: Effort normal and breath sounds normal.  GI: Soft. Bowel sounds are normal.  Genitourinary:    Genitourinary Comments: Deferred   Musculoskeletal: Normal range of motion.  Neurological: She is alert and oriented to person, place, and time.  Skin: Skin is warm and dry.   Psychiatric: She has a normal mood and affect. Her behavior is normal.    Fetal Assessment 135 bpm, Mod Var, -Decels, +Accels Toco: None graphed  MAU Course   Results for orders placed or performed during the hospital encounter of 03/11/18 (from the past 24 hour(s))  Urinalysis, Routine w reflex microscopic     Status: Abnormal   Collection Time: 03/11/18  1:47 PM  Result Value Ref Range   Color, Urine YELLOW YELLOW   APPearance HAZY (A) CLEAR   Specific Gravity, Urine 1.025 1.005 - 1.030  pH 6.5 5.0 - 8.0   Glucose, UA NEGATIVE NEGATIVE mg/dL   Hgb urine dipstick NEGATIVE NEGATIVE   Bilirubin Urine NEGATIVE NEGATIVE   Ketones, ur NEGATIVE NEGATIVE mg/dL   Protein, ur NEGATIVE NEGATIVE mg/dL   Nitrite NEGATIVE NEGATIVE   Leukocytes,Ua MODERATE (A) NEGATIVE  Urinalysis, Microscopic (reflex)     Status: Abnormal   Collection Time: 03/11/18  1:47 PM  Result Value Ref Range   RBC / HPF NONE SEEN 0 - 5 RBC/hpf   WBC, UA 11-20 0 - 5 WBC/hpf   Bacteria, UA MANY (A) NONE SEEN   Squamous Epithelial / LPF 6-10 0 - 5   Mucus PRESENT     MDM PE Labs:UA Start IV IV Fluids Headache Protocol O2 via Nasal Cannula Assessment and Plan  33 year old [id number] at 36.1wks Cat I FT Migraine  -Exam findings discussed -Will start IV with LR bolus -Give meds per HA protocol; Decadron 10mg , Reglan, 10mg , and Benadryl 25mg  via IV -Give O2 via Nasal cannula at 2L -Will continue to monitor and reassess  -NST Reactive   Follow Up (3:58 PM)  -Patient reports no improvement of migraine with current regime -Will continue fluids and O2 -Give Morphine IV 2mg  now -Will reassess -NST Reactive  Follow Up (5:22 PM)  -Patient reports no improvement of migraine s/p morphine -Will order MgSO4 bolus; 2 grams in Normal Saline; Orders placed by pharmacy. -Will reevaluate after infusion completion  Follow Up (6:48 PM)  -S/P MgSO4 Bolus and nurse reports patient without relief. -Dr. Jolayne Pantheronstant  consulted and updated on patient status *Advised to call pharmacy and see if headache cocktail can be repeated. *If so, give and reassess *If not have patient continue fiorcet and send referral for Neurology consult. -Pharmacist-Andrea reports cocktail is for one time dosing. -Patient informed of MD recommendation  -Instructed to continue fiorcet at home, rest, and hydrate. -Patient states "I can feel my heartbeat in my head and I am just ready to go." -No questions or concerns,but patient affirms that she has not seen a neurologist regarding Migraines. -Encouraged to call or return to MAU if symptoms worsen or with the onset of new symptoms. -Discharged to home in stable condition  Jacqueline RobinsJessica L Keondria Siever MSN, CNM 03/11/2018, 2:11 PM

## 2018-03-11 NOTE — Progress Notes (Signed)
Subjective:  Jacqueline Clarke is a 33 y.o. [id number] at [redacted]w[redacted]d being seen today for ongoing prenatal care.  She is currently monitored for the following issues for this high-risk pregnancy and has Low vitamin D level; Rh negative status during pregnancy; Morbid obesity (HCC); History of gestational hypertension; Pyelonephritis; History of ileus; Depression, postpartum; Adjustment insomnia; Traumatic injury during pregnancy; Supervision of high risk pregnancy, antepartum; and Obesity affecting pregnancy, antepartum on their problem list.  Patient reports headache.  Contractions: Irritability. Vag. Bleeding: None.  Movement: Present. Denies leaking of fluid.   The following portions of the patient's history were reviewed and updated as appropriate: allergies, current medications, past family history, past medical history, past social history, past surgical history and problem list. Problem list updated.  Objective:   Vitals:   03/11/18 1122  BP: 122/83  Pulse: 86  Weight: (!) 312 lb (141.5 kg)    Fetal Status:     Movement: Present     General:  Alert, oriented and cooperative. Patient is in no acute distress.  Skin: Skin is warm and dry. No rash noted.   Cardiovascular: Normal heart rate noted  Respiratory: Normal respiratory effort, no problems with respiration noted  Abdomen: Soft, gravid, appropriate for gestational age. Pain/Pressure: Present     Pelvic:  Cervical exam deferred        Extremities: Normal range of motion.     Mental Status: Normal mood and affect. Normal behavior. Normal judgment and thought content.   Urinalysis:      Assessment and Plan:  Pregnancy: [id number] at [redacted]w[redacted]d  1. Supervision of high risk pregnancy, antepartum  2. Migraine without aura and without status migrainosus, not intractable - patient sent to [facility] for migraine treatment  3. Obesity affecting pregnancy, antepartum   Preterm labor symptoms and general obstetric precautions including but not  limited to vaginal bleeding, contractions, leaking of fluid and fetal movement were reviewed in detail with the patient. Please refer to After Visit Summary for other counseling recommendations.  Return in about 1 week (around 03/18/2018) for ROB.   Brock Bad, MD

## 2018-03-11 NOTE — MAU Note (Signed)
Pt reports she has had headache x 3 days ago. Stated she has felt light headed  And dizzy and she fell yesterday on her back. C/op cramping as well. Took Fiorecet a 10:30 without releif

## 2018-03-11 NOTE — Discharge Instructions (Signed)

## 2018-03-12 ENCOUNTER — Other Ambulatory Visit: Payer: Self-pay

## 2018-03-12 DIAGNOSIS — G43909 Migraine, unspecified, not intractable, without status migrainosus: Secondary | ICD-10-CM

## 2018-03-12 LAB — CULTURE, OB URINE

## 2018-03-16 ENCOUNTER — Encounter (HOSPITAL_COMMUNITY): Payer: Self-pay | Admitting: *Deleted

## 2018-03-16 ENCOUNTER — Inpatient Hospital Stay (HOSPITAL_COMMUNITY)
Admission: AD | Admit: 2018-03-16 | Discharge: 2018-03-16 | Disposition: A | Discharge status: Home / Self Care | Payer: Medicaid Other | Attending: Obstetrics & Gynecology | Admitting: Obstetrics & Gynecology

## 2018-03-16 ENCOUNTER — Other Ambulatory Visit: Payer: Self-pay

## 2018-03-16 ENCOUNTER — Telehealth: Payer: Self-pay

## 2018-03-16 DIAGNOSIS — Z3A36 36 weeks gestation of pregnancy: Secondary | ICD-10-CM

## 2018-03-16 DIAGNOSIS — O26893 Other specified pregnancy related conditions, third trimester: Secondary | ICD-10-CM | POA: Diagnosis not present

## 2018-03-16 DIAGNOSIS — R519 Headache, unspecified: Secondary | ICD-10-CM

## 2018-03-16 DIAGNOSIS — R42 Dizziness and giddiness: Secondary | ICD-10-CM

## 2018-03-16 DIAGNOSIS — Z87891 Personal history of nicotine dependence: Secondary | ICD-10-CM | POA: Diagnosis not present

## 2018-03-16 DIAGNOSIS — R51 Headache: Secondary | ICD-10-CM | POA: Diagnosis not present

## 2018-03-16 DIAGNOSIS — R197 Diarrhea, unspecified: Secondary | ICD-10-CM

## 2018-03-16 LAB — COMPREHENSIVE METABOLIC PANEL
ALT: 9 U/L (ref 0–44)
AST: 16 U/L (ref 15–41)
Albumin: 2.6 g/dL — ABNORMAL LOW (ref 3.5–5.0)
Alkaline Phosphatase: 133 U/L — ABNORMAL HIGH (ref 38–126)
Anion gap: 6 (ref 5–15)
BUN: 9 mg/dL (ref 6–20)
CO2: 20 mmol/L — ABNORMAL LOW (ref 22–32)
Calcium: 8.8 mg/dL — ABNORMAL LOW (ref 8.9–10.3)
Chloride: 109 mmol/L (ref 98–111)
Creatinine, Ser: 0.62 mg/dL (ref 0.44–1.00)
GFR calc non Af Amer: >60 mL/min (ref >60)
Glucose, Bld: 78 mg/dL (ref 70–99)
Potassium: 4.3 mmol/L (ref 3.5–5.1)
Sodium: 135 mmol/L (ref 135–145)
Total Bilirubin: 0.7 mg/dL (ref 0.3–1.2)
Total Protein: 6.3 g/dL — ABNORMAL LOW (ref 6.5–8.1)

## 2018-03-16 LAB — CBC
HCT: 33.7 % — ABNORMAL LOW (ref 36.0–46.0)
Hemoglobin: 9.9 g/dL — ABNORMAL LOW (ref 12.0–15.0)
MCH: 24.9 pg — ABNORMAL LOW (ref 26.0–34.0)
MCHC: 29.4 g/dL — ABNORMAL LOW (ref 30.0–36.0)
MCV: 84.9 fL (ref 80.0–100.0)
Platelets: 235 10*3/uL (ref 150–400)
RBC: 3.97 MIL/uL (ref 3.87–5.11)
RDW: 14.4 % (ref 11.5–15.5)
WBC: 7.9 10*3/uL (ref 4.0–10.5)
nRBC: 0 % (ref 0.0–0.2)

## 2018-03-16 LAB — URINALYSIS, ROUTINE W REFLEX MICROSCOPIC
Bilirubin Urine: NEGATIVE
GLUCOSE, UA: NEGATIVE mg/dL
Hgb urine dipstick: NEGATIVE
Ketones, ur: NEGATIVE mg/dL
Nitrite: NEGATIVE
PH: 6 (ref 5.0–8.0)
Protein, ur: NEGATIVE mg/dL
Specific Gravity, Urine: 1.018 (ref 1.005–1.030)

## 2018-03-16 LAB — PROTEIN / CREATININE RATIO, URINE
Creatinine, Urine: 92.85 mg/dL
Protein Creatinine Ratio: 0.12 mg/mg{Cre} (ref 0.00–0.15)
Total Protein, Urine: 11 mg/dL

## 2018-03-16 MED ORDER — ACETAMINOPHEN 500 MG PO TABS
1000.0000 mg | ORAL_TABLET | Freq: Once | ORAL | Status: AC
  Administered 2018-03-16: 1000 mg via ORAL
  Filled 2018-03-16: qty 2

## 2018-03-16 MED ORDER — PROMETHAZINE HCL 25 MG/ML IJ SOLN
25.0000 mg | Freq: Once | INTRAMUSCULAR | Status: AC
  Administered 2018-03-16: 25 mg via INTRAVENOUS
  Filled 2018-03-16: qty 1

## 2018-03-16 MED ORDER — LACTATED RINGERS IV BOLUS
1000.0000 mL | Freq: Once | INTRAVENOUS | Status: AC
  Administered 2018-03-16: 1000 mL via INTRAVENOUS

## 2018-03-16 NOTE — MAU Note (Signed)
Since 0530, has been pooping.  Started; yesterday. Has now become watery.  Feels weak.  Feels a lot of pressure.  Has a headache.

## 2018-03-16 NOTE — Telephone Encounter (Signed)
The patient called stating that she has an appointment for tomorrow and does not think she can make it until then.  The patient stated that she has been up all morning taking pictures of the mucous and fluid that has been coming out.  The patient also stated that she is dizzy and seeing spot.  I advised the patient that she need to go to the [facility] to be evaluated.  The patient voiced understanding and stated that she would.

## 2018-03-16 NOTE — MAU Provider Note (Signed)
History    CSN: [id number] Arrival date and time: 03/16/18 1254 First Provider Initiated Contact with Patient 03/16/18 1410     Chief Complaint  Patient presents with  . Headache  . Fatigue  . Diarrhea   HPI  Jacqueline Clarke is a 33yo [id number] at [redacted]w[redacted]d who presents to MAU with several complaints but primarily diarrhea for last 24 hours accompanied by abdominal pressure, fatigue, and lightheadedness. States had 4-5 episodes of loose stools last night. Usually only has BM 1-2 times per week. Was awakened this morning around 0500 because she had episode of diarrhea in bed then has needed to go to the bathroom at least once an hour since that time. States brown/tan/yellow in color, noticed some blood with wiping only but nothing in toilet bowl. Also has nausea, reports one episode of vomiting on arrival to MAU, was unwitnessed. States it was yellow. Has only had crackers to drink today. Also was able to have a little water and some gatorade.   Pressure in midline of abdomen since diarrhea started. Feels like "punched in gut" and worse with walking. No pelvic or vaginal pressure.   Headache really just with bending over and wonders if it is because she is dehydrated. Has also required blood transfusions for anemia in the past. Was told at [facility] recently her HgB was 8.0. Some spots in vision when bending over as well. Took Fioricet this morning for headache.   Normal fetal movement. Denies vaginal discharge, bleeding. Denies contractions.   OB History    Gravida  8   Para  5   Term  4   Preterm  1   AB  2   Living  6     SAB  2   TAB  0   Ectopic  0   Multiple  1   Live Births  6           Past Medical History:  Diagnosis Date  . Anemia   . Anxiety   . Asthma   . Bipolar disorder (HCC)   . Blood type, Rh negative   . Cholelithiasis   . Chronic back pain    scoliosis- on percocet  . Depression   . Headache(784.0)   . History of cocaine use   . Hyperemesis  complicating pregnancy, antepartum 2013  . 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 Obesity   . Pregnancy induced hypertension   . Scoliosis     Past Surgical History:  Procedure Laterality Date  . arm surgery    . CESAREAN SECTION N/A 06/23/2016   Procedure: CESAREAN SECTION;  Surgeon: [place] Bing, MD;  Location: [facility] BIRTHING SUITES;  Service: Obstetrics;  Laterality: N/A;  . CHOLECYSTECTOMY    . DILATION AND CURETTAGE OF UTERUS  2008  . MR WRIST RIGHT     sx to nerves  . NERVE, TENDON AND ARTERY REPAIR Right 06/18/2014   Procedure: NERVE, TENDON AND ARTERY REPAIR;  Surgeon: Bradly Bienenstock, MD;  Location: [facility] OR;  Service: Orthopedics;  Laterality: Right;  . TONSILLECTOMY AND ADENOIDECTOMY    . VAGINAL DELIVERY  06/23/2016   Procedure: VAGINAL DELIVERY;  Surgeon: [place] Bing, MD;  Location: [facility] BIRTHING SUITES;  Service: Obstetrics;;    Family History  Problem Relation Age of Onset  . Heart disease Maternal Grandfather   . Diabetes Maternal Grandfather   . Heart disease Father   . Anesthesia problems Neg Hx     Social History  Tobacco Use  . Smoking status: Former Smoker    Packs/day: 0.25    Types: Cigarettes    Last attempt to quit: 08/02/2017    Years since quitting: 0.6  . Smokeless tobacco: Never Used  Substance Use Topics  . Alcohol use: Not Currently    Alcohol/week: 0.0 standard drinks    Comment: Occassionally  . Drug use: Not Currently    Types: Cocaine    Comment: UDS + 11/22/17    Allergies: No Known Allergies  Medications Prior to Admission  Medication Sig Dispense Refill Last Dose  . cyclobenzaprine (FLEXERIL) 5 MG tablet Take 1 tablet (5 mg total) by mouth 3 (three) times daily as needed for muscle spasms. 20 tablet 0 Taking  . metoCLOPramide (REGLAN) 10 MG tablet Take 1 tablet (10 mg total) by mouth every 8 (eight) hours as needed. 30 tablet 0 Taking  . Prenat-Fe Poly-Methfol-FA-DHA (VITAFOL ULTRA) 29-0.6-0.4-200 MG CAPS Take 1 capsule by  mouth daily before breakfast. 90 capsule 4 Taking  . sertraline (ZOLOFT) 25 MG tablet Take 1 tablet (25 mg total) by mouth daily. 30 tablet 11 Taking    Review of Systems  Constitutional: Positive for activity change, appetite change, chills and fatigue. Negative for fever.  HENT: Negative for congestion.   Eyes: Positive for visual disturbance.  Respiratory: Negative for shortness of breath.   Cardiovascular: Negative for chest pain and palpitations.  Gastrointestinal: Negative for abdominal pain.  Endocrine: Negative for polyuria.  Genitourinary: Negative for dysuria, flank pain, pelvic pain, urgency, vaginal bleeding, vaginal discharge and vaginal pain.  Musculoskeletal: Negative for arthralgias, back pain and myalgias.  Skin: Negative for rash.  Neurological: Positive for light-headedness and headaches.  Psychiatric/Behavioral: Positive for sleep disturbance. The patient is not nervous/anxious.    Physical Exam   Blood pressure 123/83, pulse 84, temperature 98.6 F (37 C), temperature source Oral, resp. rate 20, weight (!) 141.2 kg, last menstrual period 07/22/2017, SpO2 100 %, not currently breastfeeding.  Physical Exam  Nursing note and vitals reviewed. Constitutional: She is oriented to person, place, and time. She appears well-developed and well-nourished. No distress.  HENT:  Head: Normocephalic and atraumatic.  Eyes: Conjunctivae and EOM are normal. No scleral icterus.  Cardiovascular: Normal rate, regular rhythm, normal heart sounds and intact distal pulses.  No murmur heard. Respiratory: Effort normal and breath sounds normal. She has no wheezes.  GI: Soft. Bowel sounds are normal. There is abdominal tenderness (midline lower abdomen along pannus). There is no guarding.  gravid  Genitourinary:    Genitourinary Comments: SVE: 1/thick/ballotable    Musculoskeletal:        General: No edema.  Neurological: She is alert and oriented to person, place, and time.  Skin:  Skin is warm and dry. No rash noted.  Psychiatric: She has a normal mood and affect. Her behavior is normal.   MAU Course  Procedures  MDM -- given degree of diarrhea, will check CBC and CMP to ensure no electrolyte abnormalities and signs of infection  also history of anemia  -- enteric precautions  -- will start IVF and given IV phenergan for nausea  -- U/A pending though low suspicion for UTI - moderate leukocytes, rare bacteria - will send for urine culture but no tx warranted at this time since asx and low suspicion  -- CBC with HgB stable 9.9/33.7 - actually improved from last check 3 weeks ago  -- UPC 0.12  CMP wnl -- patient states symptoms improved after IVF, tylenol and  phenergan - no longer having headache - has been up to bathroom to urinate but no more diarrhea or vomiting -- Reactive NST: Baseline from 130-140s/mod/+a/-d, no uterine contractions   Assessment and Plan  Jacqueline Clarke is a 32yo [id number] at [redacted]w[redacted]d who presented to MAU with diarrhea, lightheadedness and headache. Diarrhea most consistent with viral gastroenteritis especially given some nausea and one episode of vomiting. Headache, nausea,  lightheadedness improved with IVF, phenergan, Tylenol. Low suspicion for any pregnancy-induced HTN, as BP normal and known history of chronic headaches. Less likely anemia contributing to symptoms as actually improved from prior. No additional diarrhea episodes or vomiting in MAU. Stable for discharge, reviewed discharge instructions including increased fluids, bland diet for next few days. Has follow-up prenatal appointment tomorrow. Reviewed return precautions, all questions answered prior to discharge.   Tamera Stands, DO 03/16/2018, 2:14 PM

## 2018-03-17 ENCOUNTER — Encounter: Payer: Medicaid Other | Admitting: Obstetrics

## 2018-03-23 ENCOUNTER — Other Ambulatory Visit (HOSPITAL_COMMUNITY)
Admission: RE | Admit: 2018-03-23 | Discharge: 2018-03-23 | Disposition: A | Discharge status: Home / Self Care | Payer: Medicaid Other | Source: Ambulatory Visit | Attending: Obstetrics | Admitting: Obstetrics

## 2018-03-23 ENCOUNTER — Other Ambulatory Visit: Payer: Self-pay

## 2018-03-23 ENCOUNTER — Ambulatory Visit (INDEPENDENT_AMBULATORY_CARE_PROVIDER_SITE_OTHER): Payer: Medicaid Other | Admitting: Obstetrics

## 2018-03-23 ENCOUNTER — Encounter: Payer: Self-pay | Admitting: Obstetrics

## 2018-03-23 VITALS — BP 123/84 | HR 90 | Wt 312.9 lb

## 2018-03-23 DIAGNOSIS — O0990 Supervision of high risk pregnancy, unspecified, unspecified trimester: Secondary | ICD-10-CM

## 2018-03-23 DIAGNOSIS — Z3A37 37 weeks gestation of pregnancy: Secondary | ICD-10-CM

## 2018-03-23 DIAGNOSIS — Z6791 Unspecified blood type, Rh negative: Secondary | ICD-10-CM

## 2018-03-23 DIAGNOSIS — O360920 Maternal care for other rhesus isoimmunization, second trimester, not applicable or unspecified: Secondary | ICD-10-CM

## 2018-03-23 DIAGNOSIS — O99210 Obesity complicating pregnancy, unspecified trimester: Secondary | ICD-10-CM

## 2018-03-23 DIAGNOSIS — O26892 Other specified pregnancy related conditions, second trimester: Secondary | ICD-10-CM

## 2018-03-23 DIAGNOSIS — O99212 Obesity complicating pregnancy, second trimester: Secondary | ICD-10-CM

## 2018-03-23 NOTE — Progress Notes (Signed)
ROB/GBS.  C/o contractions 15 minutes apart.

## 2018-03-23 NOTE — Progress Notes (Signed)
Subjective:  Jacqueline Clarke is a 33 y.o. [id number] at [redacted]w[redacted]d being seen today for ongoing prenatal care.  She is currently monitored for the following issues for this high-risk pregnancy and has Low vitamin D level; Rh negative status during pregnancy; Morbid obesity (HCC); History of gestational hypertension; Pyelonephritis; History of ileus; Depression, postpartum; Adjustment insomnia; Traumatic injury during pregnancy; Supervision of high risk pregnancy, antepartum; and Obesity affecting pregnancy, antepartum on their problem list.  Patient reports no complaints.  Contractions: Irregular. Vag. Bleeding: None.  Movement: Present. Denies leaking of fluid.   The following portions of the patient's history were reviewed and updated as appropriate: allergies, current medications, past family history, past medical history, past social history, past surgical history and problem list. Problem list updated.  Objective:   Vitals:   03/23/18 1054  BP: 123/84  Pulse: 90  Weight: (!) 312 lb 14.4 oz (141.9 kg)    Fetal Status: Fetal Heart Rate (bpm): 150   Movement: Present     General:  Alert, oriented and cooperative. Patient is in no acute distress.  Skin: Skin is warm and dry. No rash noted.   Cardiovascular: Normal heart rate noted  Respiratory: Normal respiratory effort, no problems with respiration noted  Abdomen: Soft, gravid, appropriate for gestational age. Pain/Pressure: Present     Pelvic:  Cervical exam deferred        Extremities: Normal range of motion.  Edema: Trace  Mental Status: Normal mood and affect. Normal behavior. Normal judgment and thought content.   Urinalysis:      Assessment and Plan:  Pregnancy: [id number] at [redacted]w[redacted]d  1. Supervision of high risk pregnancy, antepartum Rx: - Strep Gp B NAA - Cervicovaginal ancillary only( [facility])  2. Rh negative status during pregnancy in second trimester  3. Obesity affecting pregnancy, antepartum   Term labor symptoms  and general obstetric precautions including but not limited to vaginal bleeding, contractions, leaking of fluid and fetal movement were reviewed in detail with the patient. Please refer to After Visit Summary for other counseling recommendations.  Return in about 1 week (around 03/30/2018) for ROB.   Brock Bad, MD

## 2018-03-25 ENCOUNTER — Other Ambulatory Visit: Payer: Self-pay | Admitting: Obstetrics

## 2018-03-25 DIAGNOSIS — B9689 Other specified bacterial agents as the cause of diseases classified elsewhere: Secondary | ICD-10-CM

## 2018-03-25 DIAGNOSIS — N760 Acute vaginitis: Principal | ICD-10-CM

## 2018-03-25 LAB — CERVICOVAGINAL ANCILLARY ONLY
Bacterial vaginitis: POSITIVE — AB
Candida vaginitis: NEGATIVE
Chlamydia: NEGATIVE
Neisseria Gonorrhea: NEGATIVE
Trichomonas: NEGATIVE

## 2018-03-25 LAB — STREP GP B NAA: Strep Gp B NAA: NEGATIVE

## 2018-03-25 MED ORDER — CLINDAMYCIN HCL 300 MG PO CAPS: 300.0000 mg | ORAL_CAPSULE | Freq: Three times a day (TID) | ORAL | 0 refills | Status: DC

## 2018-03-26 ENCOUNTER — Other Ambulatory Visit: Payer: Self-pay

## 2018-03-26 ENCOUNTER — Inpatient Hospital Stay (HOSPITAL_COMMUNITY)
Admission: AD | Admit: 2018-03-26 | Discharge: 2018-03-26 | Disposition: A | Discharge status: Home / Self Care | Payer: Medicaid Other | Source: Ambulatory Visit | Attending: Obstetrics & Gynecology | Admitting: Obstetrics & Gynecology

## 2018-03-26 ENCOUNTER — Encounter (HOSPITAL_COMMUNITY): Payer: Self-pay

## 2018-03-26 ENCOUNTER — Encounter (HOSPITAL_COMMUNITY): Payer: Self-pay | Admitting: *Deleted

## 2018-03-26 ENCOUNTER — Inpatient Hospital Stay (EMERGENCY_DEPARTMENT_HOSPITAL)
Admission: AD | Admit: 2018-03-26 | Discharge: 2018-03-26 | Disposition: A | Discharge status: Home / Self Care | Payer: Medicaid Other | Source: Home / Self Care | Attending: Obstetrics and Gynecology | Admitting: Obstetrics and Gynecology

## 2018-03-26 DIAGNOSIS — O9A219 Injury, poisoning and certain other consequences of external causes complicating pregnancy, unspecified trimester: Secondary | ICD-10-CM

## 2018-03-26 DIAGNOSIS — Z98891 History of uterine scar from previous surgery: Secondary | ICD-10-CM

## 2018-03-26 DIAGNOSIS — Z6791 Unspecified blood type, Rh negative: Secondary | ICD-10-CM

## 2018-03-26 DIAGNOSIS — O99210 Obesity complicating pregnancy, unspecified trimester: Secondary | ICD-10-CM

## 2018-03-26 DIAGNOSIS — O479 False labor, unspecified: Secondary | ICD-10-CM

## 2018-03-26 DIAGNOSIS — O26892 Other specified pregnancy related conditions, second trimester: Secondary | ICD-10-CM

## 2018-03-26 DIAGNOSIS — O0990 Supervision of high risk pregnancy, unspecified, unspecified trimester: Secondary | ICD-10-CM

## 2018-03-26 DIAGNOSIS — Z3A38 38 weeks gestation of pregnancy: Secondary | ICD-10-CM

## 2018-03-26 DIAGNOSIS — O471 False labor at or after 37 completed weeks of gestation: Secondary | ICD-10-CM

## 2018-03-26 DIAGNOSIS — Z3A39 39 weeks gestation of pregnancy: Secondary | ICD-10-CM | POA: Insufficient documentation

## 2018-03-26 NOTE — Discharge Instructions (Signed)
Braxton Hicks Contractions Contractions of the uterus can occur throughout pregnancy, but they are not always a sign that you are in labor. You may have practice contractions called Braxton Hicks contractions. These false labor contractions are sometimes confused with true labor. What are Braxton Hicks contractions? Braxton Hicks contractions are tightening movements that occur in the muscles of the uterus before labor. Unlike true labor contractions, these contractions do not result in opening (dilation) and thinning of the cervix. Toward the end of pregnancy (32-34 weeks), Braxton Hicks contractions can happen more often and may become stronger. These contractions are sometimes difficult to tell apart from true labor because they can be very uncomfortable. You should not feel embarrassed if you go to the hospital with false labor. Sometimes, the only way to tell if you are in true labor is for your health care provider to look for changes in the cervix. The health care provider will do a physical exam and may monitor your contractions. If you are not in true labor, the exam should show that your cervix is not dilating and your water has not broken. If there are no other health problems associated with your pregnancy, it is completely safe for you to be sent home with false labor. You may continue to have Braxton Hicks contractions until you go into true labor. How to tell the difference between true labor and false labor True labor  Contractions last 30-70 seconds.  Contractions become very regular.  Discomfort is usually felt in the top of the uterus, and it spreads to the lower abdomen and low back.  Contractions do not go away with walking.  Contractions usually become more intense and increase in frequency.  The cervix dilates and gets thinner. False labor  Contractions are usually shorter and not as strong as true labor contractions.  Contractions are usually irregular.  Contractions  are often felt in the front of the lower abdomen and in the groin.  Contractions may go away when you walk around or change positions while lying down.  Contractions get weaker and are shorter-lasting as time goes on.  The cervix usually does not dilate or become thin. Follow these instructions at home:   Take over-the-counter and prescription medicines only as told by your health care provider.  Keep up with your usual exercises and follow other instructions from your health care provider.  Eat and drink lightly if you think you are going into labor.  If Braxton Hicks contractions are making you uncomfortable: ? Change your position from lying down or resting to walking, or change from walking to resting. ? Sit and rest in a tub of warm water. ? Drink enough fluid to keep your urine pale yellow. Dehydration may cause these contractions. ? Do slow and deep breathing several times an hour.  Keep all follow-up prenatal visits as told by your health care provider. This is important. Contact a health care provider if:  You have a fever.  You have continuous pain in your abdomen. Get help right away if:  Your contractions become stronger, more regular, and closer together.  You have fluid leaking or gushing from your vagina.  You pass blood-tinged mucus (bloody show).  You have bleeding from your vagina.  You have low back pain that you never had before.  You feel your baby's head pushing down and causing pelvic pressure.  Your baby is not moving inside you as much as it used to. Summary  Contractions that occur before labor are   called Braxton Hicks contractions, false labor, or practice contractions.  Braxton Hicks contractions are usually shorter, weaker, farther apart, and less regular than true labor contractions. True labor contractions usually become progressively stronger and regular, and they become more frequent.  Manage discomfort from Braxton Hicks contractions  by changing position, resting in a warm bath, drinking plenty of water, or practicing deep breathing. This information is not intended to replace advice given to you by your health care provider. Make sure you discuss any questions you have with your health care provider. Document Released: 05/22/2016 Document Revised: 10/21/2016 Document Reviewed: 05/22/2016 Elsevier Interactive Patient Education  2019 Elsevier Inc.  

## 2018-03-26 NOTE — MAU Provider Note (Signed)
RN Labor Evaulation  Jacqueline Clarke is a 32yo [id number] at [redacted]w[redacted]d who presents with contractions. Pregnancy complicated by history of Cesarean section (for twin breech presentation), morbid obesity, Rh(-), and history of depression. Observed for a little over an hour. Cervix unchanged, appeared comfortable during monitoring. Reactive tracing 150s/mod/+a/-d, uterine irritability. Large accelerations, a couple of subtle decelerations in beginning of tracing but resolved spontaneously.   TOLAC consent reviewed, counseled on risks/benefits. VBAC success rate estimated to >50%. Consent signed and scanned into chart via Haiku.  Labor/return precautions reviewed prior to discharge. Encouraged to follow-up in clinic.   Cristal Deer. Earlene Plater, DO OB/GYN Fellow

## 2018-03-26 NOTE — MAU Note (Signed)
Contractions are getting worse.  Has started bleeding., no mucous.  Was 1+cm earlier.  Feeling a lot of pressure

## 2018-03-26 NOTE — MAU Note (Signed)
PT SAYS FEELS UC SINCE 1030.   PNC- DR HARPER-  NO VE.   Marland Kitchen DENIES HSV AND MRSA. GBS- UNSURE

## 2018-03-29 ENCOUNTER — Inpatient Hospital Stay (HOSPITAL_COMMUNITY)
Admission: AD | Admit: 2018-03-29 | Discharge: 2018-03-29 | Disposition: A | Discharge status: Home / Self Care | Payer: Medicaid Other | Source: Ambulatory Visit | Attending: Obstetrics & Gynecology | Admitting: Obstetrics & Gynecology

## 2018-03-29 ENCOUNTER — Encounter (HOSPITAL_COMMUNITY): Payer: Self-pay

## 2018-03-29 DIAGNOSIS — Z3A38 38 weeks gestation of pregnancy: Secondary | ICD-10-CM | POA: Diagnosis not present

## 2018-03-29 DIAGNOSIS — O0990 Supervision of high risk pregnancy, unspecified, unspecified trimester: Secondary | ICD-10-CM

## 2018-03-29 DIAGNOSIS — Z0371 Encounter for suspected problem with amniotic cavity and membrane ruled out: Secondary | ICD-10-CM | POA: Insufficient documentation

## 2018-03-29 DIAGNOSIS — Z98891 History of uterine scar from previous surgery: Secondary | ICD-10-CM

## 2018-03-29 DIAGNOSIS — O99210 Obesity complicating pregnancy, unspecified trimester: Secondary | ICD-10-CM

## 2018-03-29 LAB — POCT FERN TEST

## 2018-03-29 NOTE — MAU Provider Note (Addendum)
S: Ms. ZURIEL SENKO is a 33 y.o. [phone] at [redacted]w[redacted]d  who presents to MAU today complaining of leaking of fluid that ran down her leg earlier. She endorses vaginal bleeding. She endorses contractions. She reports normal fetal movement.    O: BP 125/78 (BP Location: Left Arm)   Pulse 90   Temp 98.5 F (36.9 C) (Oral)   Resp 18   Ht 5\' 11"  (1.803 m)   Wt (!) 141.1 kg   LMP 07/22/2017   SpO2 98%   BMI 43.38 kg/m  GENERAL: Well-developed, well-nourished female in no acute distress.  HEAD: Normocephalic, atraumatic.  CHEST: Normal effort of breathing, regular heart rate ABDOMEN: Soft, nontender, gravid PELVIC: Normal external female genitalia. Vagina is pink and rugated. Cervix with normal contour, no lesions. Small amount of bloody show and thin vaginal discharge.  Negative pooling.   Cervical exam:  Dilation: 2 Effacement (%): 50 Cervical Position: Posterior Station: -3 Presentation: Vertex Exam by:: [email or web address]-[email or web address], rnc  * Recheck cervix unchanged after 2 hrs  Fetal Monitoring: Baseline: 130 Variability: moderate Accelerations: present Decelerations: occ variables Contractions: Irreg UC's with UI noted  Results for orders placed or performed during the hospital encounter of 03/29/18 (from the past 24 hour(s))  POCT fern test     Status: Normal   Collection Time: 03/29/18  1:14 AM  Result Value Ref Range   POCT Fern Test     MDM Unable to perform Amnisure d/t amount of bloody show  A: SIUP at [redacted]w[redacted]d  Membranes intact  P: False Labor  No leakage of amniotic Fluid into vagina  D/C home with labor precautions Keep scheduled appt with [facility] on Tuesday 03/30/2018 Patient verbalized an understanding of the plan of care and agrees.   Raelyn Mora, CNM 03/29/2018, 1:20 AM

## 2018-03-29 NOTE — MAU Note (Signed)
Pt here with c/o pressure, some leaking of fluid, and some contractions. Reports decreased fetal movement.

## 2018-03-29 NOTE — Discharge Instructions (Signed)
The bleeding you had tonight was bloody show. Your membranes are NOT broken.

## 2018-03-30 ENCOUNTER — Encounter: Payer: Self-pay | Admitting: Obstetrics

## 2018-03-30 ENCOUNTER — Ambulatory Visit (INDEPENDENT_AMBULATORY_CARE_PROVIDER_SITE_OTHER): Payer: Medicaid Other | Admitting: Obstetrics

## 2018-03-30 ENCOUNTER — Other Ambulatory Visit: Payer: Self-pay

## 2018-03-30 VITALS — BP 119/80 | HR 86 | Wt 312.8 lb

## 2018-03-30 DIAGNOSIS — Z6791 Unspecified blood type, Rh negative: Secondary | ICD-10-CM

## 2018-03-30 DIAGNOSIS — O99210 Obesity complicating pregnancy, unspecified trimester: Secondary | ICD-10-CM

## 2018-03-30 DIAGNOSIS — O360930 Maternal care for other rhesus isoimmunization, third trimester, not applicable or unspecified: Secondary | ICD-10-CM

## 2018-03-30 DIAGNOSIS — O0990 Supervision of high risk pregnancy, unspecified, unspecified trimester: Secondary | ICD-10-CM

## 2018-03-30 DIAGNOSIS — Z3A38 38 weeks gestation of pregnancy: Secondary | ICD-10-CM

## 2018-03-30 DIAGNOSIS — O99213 Obesity complicating pregnancy, third trimester: Secondary | ICD-10-CM

## 2018-03-30 DIAGNOSIS — O26892 Other specified pregnancy related conditions, second trimester: Secondary | ICD-10-CM

## 2018-03-30 NOTE — Progress Notes (Signed)
Subjective:  Jacqueline Clarke is a 33 y.o. [id number] at [redacted]w[redacted]d being seen today for ongoing prenatal care.  She is currently monitored for the following issues for this high-risk pregnancy and has Low vitamin D level; Rh negative status during pregnancy; Morbid obesity (HCC); History of gestational hypertension; Pyelonephritis; History of ileus; Depression, postpartum; Adjustment insomnia; Traumatic injury during pregnancy; Supervision of high risk pregnancy, antepartum; Obesity affecting pregnancy, antepartum; and H/O: C-section on their problem list.  Patient reports no complaints.  Contractions: Irregular. Vag. Bleeding: None.  Movement: Present. Denies leaking of fluid.   The following portions of the patient's history were reviewed and updated as appropriate: allergies, current medications, past family history, past medical history, past social history, past surgical history and problem list. Problem list updated.  Objective:   Vitals:   03/30/18 1437  BP: 119/80  Pulse: 86  Weight: (!) 312 lb 12.8 oz (141.9 kg)    Fetal Status:     Movement: Present     General:  Alert, oriented and cooperative. Patient is in no acute distress.  Skin: Skin is warm and dry. No rash noted.   Cardiovascular: Normal heart rate noted  Respiratory: Normal respiratory effort, no problems with respiration noted  Abdomen: Soft, gravid, appropriate for gestational age. Pain/Pressure: Present     Pelvic:  Cervical exam performed      Cvx: 2 cm / 50% / -3 / Vtx  Extremities: Normal range of motion.  Edema: None  Mental Status: Normal mood and affect. Normal behavior. Normal judgment and thought content.   Urinalysis:      Assessment and Plan:  Pregnancy: [id number] at [redacted]w[redacted]d  1. Supervision of high risk pregnancy, antepartum  2. Obesity affecting pregnancy, antepartum  3. Rh negative status during pregnancy in second trimester - Rhogam postpartum  Term labor symptoms and general obstetric precautions  including but not limited to vaginal bleeding, contractions, leaking of fluid and fetal movement were reviewed in detail with the patient. Please refer to After Visit Summary for other counseling recommendations.  Return in about 1 week (around 04/06/2018) for ROB.   Brock Bad, MD

## 2018-03-30 NOTE — Progress Notes (Signed)
ROB.  C/o contractions 7-8 minutes apart.  Wants to be checked.

## 2018-03-31 ENCOUNTER — Other Ambulatory Visit: Payer: Self-pay

## 2018-03-31 ENCOUNTER — Inpatient Hospital Stay (HOSPITAL_COMMUNITY)
Admission: AD | Admit: 2018-03-31 | Discharge: 2018-04-02 | DRG: 797 | Disposition: A | Discharge status: Home / Self Care | Payer: Medicaid Other | Attending: Family Medicine | Admitting: Family Medicine

## 2018-03-31 ENCOUNTER — Encounter (HOSPITAL_COMMUNITY): Payer: Self-pay | Admitting: *Deleted

## 2018-03-31 DIAGNOSIS — O34219 Maternal care for unspecified type scar from previous cesarean delivery: Secondary | ICD-10-CM | POA: Diagnosis not present

## 2018-03-31 DIAGNOSIS — R443 Hallucinations, unspecified: Secondary | ICD-10-CM | POA: Diagnosis present

## 2018-03-31 DIAGNOSIS — O99214 Obesity complicating childbirth: Secondary | ICD-10-CM | POA: Diagnosis present

## 2018-03-31 DIAGNOSIS — F419 Anxiety disorder, unspecified: Secondary | ICD-10-CM | POA: Diagnosis present

## 2018-03-31 DIAGNOSIS — O99324 Drug use complicating childbirth: Secondary | ICD-10-CM

## 2018-03-31 DIAGNOSIS — O9902 Anemia complicating childbirth: Secondary | ICD-10-CM | POA: Diagnosis present

## 2018-03-31 DIAGNOSIS — O134 Gestational [pregnancy-induced] hypertension without significant proteinuria, complicating childbirth: Principal | ICD-10-CM | POA: Diagnosis present

## 2018-03-31 DIAGNOSIS — Z87891 Personal history of nicotine dependence: Secondary | ICD-10-CM | POA: Diagnosis not present

## 2018-03-31 DIAGNOSIS — Z3A39 39 weeks gestation of pregnancy: Secondary | ICD-10-CM

## 2018-03-31 DIAGNOSIS — D649 Anemia, unspecified: Secondary | ICD-10-CM | POA: Diagnosis present

## 2018-03-31 DIAGNOSIS — O26893 Other specified pregnancy related conditions, third trimester: Secondary | ICD-10-CM | POA: Diagnosis present

## 2018-03-31 DIAGNOSIS — Z302 Encounter for sterilization: Secondary | ICD-10-CM | POA: Diagnosis not present

## 2018-03-31 DIAGNOSIS — Z6791 Unspecified blood type, Rh negative: Secondary | ICD-10-CM

## 2018-03-31 DIAGNOSIS — O99344 Other mental disorders complicating childbirth: Secondary | ICD-10-CM | POA: Diagnosis present

## 2018-03-31 DIAGNOSIS — Z98891 History of uterine scar from previous surgery: Secondary | ICD-10-CM

## 2018-03-31 DIAGNOSIS — O99210 Obesity complicating pregnancy, unspecified trimester: Secondary | ICD-10-CM

## 2018-03-31 DIAGNOSIS — F329 Major depressive disorder, single episode, unspecified: Secondary | ICD-10-CM | POA: Diagnosis present

## 2018-03-31 DIAGNOSIS — O0990 Supervision of high risk pregnancy, unspecified, unspecified trimester: Secondary | ICD-10-CM

## 2018-03-31 DIAGNOSIS — O9A219 Injury, poisoning and certain other consequences of external causes complicating pregnancy, unspecified trimester: Secondary | ICD-10-CM

## 2018-03-31 HISTORY — DX: Tubulo-interstitial nephritis, not specified as acute or chronic: N12

## 2018-03-31 HISTORY — DX: Personal history of other diseases of the digestive system: Z87.19

## 2018-03-31 HISTORY — DX: Other specified abnormal findings of blood chemistry: R79.89

## 2018-03-31 LAB — CBC
HCT: 32.1 % — ABNORMAL LOW (ref 36.0–46.0)
HEMOGLOBIN: 9.4 g/dL — AB (ref 12.0–15.0)
MCH: 24.6 pg — ABNORMAL LOW (ref 26.0–34.0)
MCHC: 29.3 g/dL — ABNORMAL LOW (ref 30.0–36.0)
MCV: 84 fL (ref 80.0–100.0)
Platelets: 194 10*3/uL (ref 150–400)
RBC: 3.82 MIL/uL — ABNORMAL LOW (ref 3.87–5.11)
RDW: 15.3 % (ref 11.5–15.5)
WBC: 6.8 10*3/uL (ref 4.0–10.5)
nRBC: 0 % (ref 0.0–0.2)

## 2018-03-31 LAB — RPR: RPR Ser Ql: NONREACTIVE

## 2018-03-31 MED ORDER — TETANUS-DIPHTH-ACELL PERTUSSIS 5-2.5-18.5 LF-MCG/0.5 IM SUSP: 0.5000 mL | Freq: Once | INTRAMUSCULAR | Status: DC

## 2018-03-31 MED ORDER — BENZOCAINE-MENTHOL 20-0.5 % EX AERO
1.0000 "application " | INHALATION_SPRAY | CUTANEOUS | Status: DC | PRN
  Administered 2018-03-31: 1 via TOPICAL
  Filled 2018-03-31: qty 56

## 2018-03-31 MED ORDER — EPHEDRINE 5 MG/ML INJ: 10.0000 mg | INTRAVENOUS | Status: DC | PRN

## 2018-03-31 MED ORDER — PHENYLEPHRINE 40 MCG/ML (10ML) SYRINGE FOR IV PUSH (FOR BLOOD PRESSURE SUPPORT): 80.0000 ug | PREFILLED_SYRINGE | INTRAVENOUS | Status: DC | PRN

## 2018-03-31 MED ORDER — OXYCODONE HCL 5 MG PO TABS
5.0000 mg | ORAL_TABLET | ORAL | Status: DC | PRN
  Administered 2018-03-31: 5 mg via ORAL
  Filled 2018-03-31: qty 1

## 2018-03-31 MED ORDER — FLEET ENEMA 7-19 GM/118ML RE ENEM: 1.0000 | ENEMA | RECTAL | Status: DC | PRN

## 2018-03-31 MED ORDER — DIPHENHYDRAMINE HCL 25 MG PO CAPS: 25.0000 mg | ORAL_CAPSULE | Freq: Four times a day (QID) | ORAL | Status: DC | PRN

## 2018-03-31 MED ORDER — FENTANYL CITRATE (PF) 100 MCG/2ML IJ SOLN
INTRAMUSCULAR | Status: AC
  Filled 2018-03-31: qty 2

## 2018-03-31 MED ORDER — SENNOSIDES-DOCUSATE SODIUM 8.6-50 MG PO TABS
2.0000 | ORAL_TABLET | ORAL | Status: DC
  Administered 2018-03-31 – 2018-04-01 (×2): 2 via ORAL
  Filled 2018-03-31 (×2): qty 2

## 2018-03-31 MED ORDER — IBUPROFEN 600 MG PO TABS
600.0000 mg | ORAL_TABLET | Freq: Four times a day (QID) | ORAL | Status: DC
  Administered 2018-03-31 – 2018-04-02 (×9): 600 mg via ORAL
  Filled 2018-03-31 (×9): qty 1

## 2018-03-31 MED ORDER — OXYTOCIN 40 UNITS IN NORMAL SALINE INFUSION - SIMPLE MED
2.5000 [IU]/h | INTRAVENOUS | Status: DC
  Administered 2018-03-31: 2.5 [IU]/h via INTRAVENOUS
  Filled 2018-03-31: qty 1000

## 2018-03-31 MED ORDER — OXYCODONE-ACETAMINOPHEN 5-325 MG PO TABS: 1.0000 | ORAL_TABLET | ORAL | Status: DC | PRN

## 2018-03-31 MED ORDER — OXYCODONE-ACETAMINOPHEN 5-325 MG PO TABS: 2.0000 | ORAL_TABLET | ORAL | Status: DC | PRN

## 2018-03-31 MED ORDER — PRENATAL MULTIVITAMIN CH
1.0000 | ORAL_TABLET | Freq: Every day | ORAL | Status: DC
  Administered 2018-03-31 – 2018-04-02 (×2): 1 via ORAL
  Filled 2018-03-31 (×2): qty 1

## 2018-03-31 MED ORDER — MEASLES, MUMPS & RUBELLA VAC IJ SOLR: 0.5000 mL | Freq: Once | INTRAMUSCULAR | Status: DC

## 2018-03-31 MED ORDER — FENTANYL-BUPIVACAINE-NACL 0.5-0.125-0.9 MG/250ML-% EP SOLN: 12.0000 mL/h | EPIDURAL | Status: DC | PRN

## 2018-03-31 MED ORDER — WITCH HAZEL-GLYCERIN EX PADS: 1.0000 "application " | MEDICATED_PAD | CUTANEOUS | Status: DC | PRN

## 2018-03-31 MED ORDER — OXYCODONE-ACETAMINOPHEN 5-325 MG PO TABS
2.0000 | ORAL_TABLET | Freq: Once | ORAL | Status: AC
  Administered 2018-03-31: 2 via ORAL
  Filled 2018-03-31: qty 2

## 2018-03-31 MED ORDER — FENTANYL CITRATE (PF) 100 MCG/2ML IJ SOLN: 100.0000 ug | INTRAMUSCULAR | Status: DC | PRN

## 2018-03-31 MED ORDER — ONDANSETRON HCL 4 MG PO TABS: 4.0000 mg | ORAL_TABLET | ORAL | Status: DC | PRN

## 2018-03-31 MED ORDER — DIBUCAINE 1 % RE OINT: 1.0000 "application " | TOPICAL_OINTMENT | RECTAL | Status: DC | PRN

## 2018-03-31 MED ORDER — ONDANSETRON HCL 4 MG/2ML IJ SOLN
4.0000 mg | INTRAMUSCULAR | Status: DC | PRN
  Administered 2018-04-01: 4 mg via INTRAVENOUS

## 2018-03-31 MED ORDER — ONDANSETRON HCL 4 MG/2ML IJ SOLN: 4.0000 mg | Freq: Four times a day (QID) | INTRAMUSCULAR | Status: DC | PRN

## 2018-03-31 MED ORDER — LACTATED RINGERS IV SOLN: 500.0000 mL | INTRAVENOUS | Status: DC | PRN

## 2018-03-31 MED ORDER — OXYCODONE HCL 5 MG PO TABS
10.0000 mg | ORAL_TABLET | ORAL | Status: DC | PRN
  Administered 2018-03-31 – 2018-04-02 (×7): 10 mg via ORAL
  Filled 2018-03-31 (×7): qty 2

## 2018-03-31 MED ORDER — SIMETHICONE 80 MG PO CHEW
80.0000 mg | CHEWABLE_TABLET | ORAL | Status: DC | PRN
  Administered 2018-04-01: 80 mg via ORAL
  Filled 2018-03-31: qty 1

## 2018-03-31 MED ORDER — SOD CITRATE-CITRIC ACID 500-334 MG/5ML PO SOLN: 30.0000 mL | ORAL | Status: DC | PRN

## 2018-03-31 MED ORDER — ACETAMINOPHEN 325 MG PO TABS: 650.0000 mg | ORAL_TABLET | ORAL | Status: DC | PRN

## 2018-03-31 MED ORDER — FENTANYL CITRATE (PF) 100 MCG/2ML IJ SOLN
100.0000 ug | INTRAMUSCULAR | Status: DC | PRN
  Administered 2018-03-31: 100 ug via INTRAVENOUS

## 2018-03-31 MED ORDER — LIDOCAINE HCL (PF) 1 % IJ SOLN: 30.0000 mL | INTRAMUSCULAR | Status: DC | PRN

## 2018-03-31 MED ORDER — OXYTOCIN BOLUS FROM INFUSION
500.0000 mL | Freq: Once | INTRAVENOUS | Status: AC
  Administered 2018-03-31: 500 mL via INTRAVENOUS

## 2018-03-31 MED ORDER — ACETAMINOPHEN 325 MG PO TABS
650.0000 mg | ORAL_TABLET | ORAL | Status: DC | PRN
  Administered 2018-03-31 – 2018-04-02 (×5): 650 mg via ORAL
  Filled 2018-03-31 (×5): qty 2

## 2018-03-31 MED ORDER — LACTATED RINGERS IV SOLN: INTRAVENOUS | Status: DC

## 2018-03-31 MED ORDER — LACTATED RINGERS IV SOLN: 500.0000 mL | Freq: Once | INTRAVENOUS | Status: DC

## 2018-03-31 MED ORDER — COCONUT OIL OIL: 1.0000 "application " | TOPICAL_OIL | Status: DC | PRN

## 2018-03-31 MED ORDER — DIPHENHYDRAMINE HCL 50 MG/ML IJ SOLN: 12.5000 mg | INTRAMUSCULAR | Status: DC | PRN

## 2018-03-31 NOTE — H&P (Addendum)
OBSTETRIC ADMISSION HISTORY AND PHYSICAL  Jacqueline Clarke is a 33 y.o. female [phone] with IUP at 20w0dpresenting for ROM and Contractions starting at 0600 She reports +Fms.No blurry vision, headaches, peripheral edema, or RUQ pain. She plans on Breast and Bottle feeding. She requests BTL for birth control.  She has had X5 Vaginal deliveries the heaviest @ 4130 g and CS-Lower transverse for second of a twin delivery in 2018. She has had x2 SAB. UDS + cocaine 4 months ago and Barbiturates 1 month ago.   Dating: By USS --->  Estimated Date of Delivery: 04/07/18  Sono:    _0 , CWD, normal anatomy, Cephalic  presentation, 20786L 80%ile   Prenatal History/Complications: Grand Multip  TOLAC  gHTN  Morbid Obesity RH -ve  Pyelonephritis    Past Medical History: Past Medical History:  Diagnosis Date  . Anemia   . Anxiety   . Asthma   . [place]   . Blood type, Rh negative   . Cholelithiasis   . Chronic back pain    scoliosis- on percocet  . Depression   . Headache(784.0)   . History of cocaine use   . Hyperemesis complicating pregnancy, antepartum 2013  . 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 Obesity   . Pregnancy induced hypertension   . Scoliosis     Past Surgical History: Past Surgical History:  Procedure Laterality Date  . arm surgery    . CESAREAN SECTION N/A 06/23/2016   Procedure: CESAREAN SECTION;  Surgeon: PAletha Halim MD;  [facility]  Service: Obstetrics;  Laterality: N/A;  . CHOLECYSTECTOMY    . DILATION AND CURETTAGE OF UTERUS  2008  . MR WRIST RIGHT     sx to nerves  . NERVE, TENDON AND ARTERY REPAIR Right 06/18/2014   Procedure: NERVE, TENDON AND ARTERY REPAIR;  Surgeon: FIran Planas MD;  [facility]  Service: Orthopedics;  Laterality: Right;  . TONSILLECTOMY AND ADENOIDECTOMY    . VAGINAL DELIVERY  06/23/2016   Procedure: VAGINAL DELIVERY;  Surgeon: PAletha Halim MD;  [facility]  Service: Obstetrics;;    Obstetrical History: OB History    Gravida  8   Para  5   Term  4   Preterm  1   AB  2   Living  6     SAB  2   TAB  0   Ectopic  0   Multiple  1   Live Births  6           Social History: Social History   Socioeconomic History  . Marital status: Single    Spouse name: Not on file  . Number of children: Not on file  . Years of education: Not on file  . Highest education level: Not on file  Occupational History  . Not on file  Social Needs  . Financial resource strain: Not on file  . Food insecurity:    Worry: Not on file    Inability: Not on file  . Transportation needs:    Medical: Not on file    Non-medical: Not on file  Tobacco Use  . Smoking status: Former Smoker    Packs/day: 0.25    Types: Cigarettes    Last attempt to quit: 08/02/2017    Years since quitting: 0.6  . Smokeless tobacco: Never Used  Substance and Sexual Activity  . Alcohol use: Not Currently    Alcohol/week:  0.0 standard drinks    Comment: Occassionally  . Drug use: Not Currently    Types: Cocaine    Comment: UDS + 11/22/17  . Sexual activity: Yes    Partners: Male    Birth control/protection: None  Lifestyle  . Physical activity:    Days per week: Not on file    Minutes per session: Not on file  . Stress: Not on file  Relationships  . Social connections:    Talks on phone: Not on file    Gets together: Not on file    Attends religious service: Not on file    Active member of club or organization: Not on file    Attends meetings of clubs or organizations: Not on file    Relationship status: Not on file  Other Topics Concern  . Not on file  Social History Narrative   ** Merged History Encounter **       ** Merged History Encounter **        Family History: Family History  Problem Relation Age of Onset  . Heart disease Maternal Grandfather   . Diabetes Maternal Grandfather   . Heart disease Father   . Anesthesia problems  Neg Hx     Allergies: No Known Allergies  Medications Prior to Admission  Medication Sig Dispense Refill Last Dose  . acetaminophen (TYLENOL) 325 MG tablet Take 650 mg by mouth every 6 (six) hours as needed.   03/28/2018 at Unknown time  . clindamycin (CLEOCIN) 300 MG capsule Take 1 capsule (300 mg total) by mouth 3 (three) times daily. 21 capsule 0 03/28/2018 at Unknown time  . cyclobenzaprine (FLEXERIL) 5 MG tablet Take 1 tablet (5 mg total) by mouth 3 (three) times daily as needed for muscle spasms. 20 tablet 0 Past Week at Unknown time  . metoCLOPramide (REGLAN) 10 MG tablet Take 1 tablet (10 mg total) by mouth every 8 (eight) hours as needed. 30 tablet 0 More than a month at Unknown time  . Prenat-Fe Poly-Methfol-FA-DHA (VITAFOL ULTRA) 29-0.6-0.4-200 MG CAPS Take 1 capsule by mouth daily before breakfast. 90 capsule 4 03/28/2018 at Unknown time  . sertraline (ZOLOFT) 25 MG tablet Take 1 tablet (25 mg total) by mouth daily. 30 tablet 11 Past Week at Unknown time     Review of Systems   All systems reviewed and negative except as stated in HPI  Blood pressure (!) 148/103, pulse 73, temperature 98.7 F (37.1 C), resp. rate 19, last menstrual period 07/22/2017, SpO2 100 %, not currently breastfeeding. General appearance: alert, cooperative and moderate distress Lungs: regular rate and effort Heart: regular rate  Abdomen: soft, non-tender Extremities: Homans sign is negative, no sign of DVT Presentation: cephalic Fetal monitoringBaseline: 140 bpm, Variability: Good {> 6 bpm), Accelerations: Reactive and Decelerations: Absent Uterine activityFrequency: Every 3 minutes Dilation: 4 Effacement (%): 50 Station: -2 Exam by:: Morris,RN   Prenatal labs: ABO, Rh: O/Negative/-- (09/18 1525) Antibody: Negative (09/18 1525) Rubella: <0.90 (09/18 1525) RPR: Non Reactive (01/14 1130)  HBsAg: Negative (09/18 1525)  HIV: Non Reactive (01/14 1130)  GBS: Negative (03/03 1144)  2 hr GTT  89/163/105  Prenatal Transfer Tool  Maternal Diabetes: No Genetic Screening: Normal Maternal Ultrasounds/Referrals: Normal Fetal Ultrasounds or other Referrals:  None Maternal Substance Abuse:  Yes:  Type: Cocaine, Other: Barbiturates  Significant Maternal Medications:  None Significant Maternal Lab Results: None  No results found for this or any previous visit (from the past 24 hour(s)).  Patient Active Problem List  Diagnosis Date Noted  . Normal labor and delivery 03/31/2018  . H/O: C-section 03/26/2018  . Obesity affecting pregnancy, antepartum 11/06/2017  . Supervision of high risk pregnancy, antepartum 10/07/2017  . Traumatic injury during pregnancy 09/28/2017  . Depression, postpartum 07/09/2016  . Adjustment insomnia 07/09/2016  . History of ileus 07/06/2016  . Pyelonephritis 07/03/2016  . History of gestational hypertension 06/30/2016  . Rh negative status during pregnancy   . Morbid obesity ([place])   . Low vitamin D level 01/03/2016    Assessment: Jacqueline Clarke is a 33 y.o. female [phone] with IUP at 80w0dpresenting for ROM and Contractions starting at 0600.  1. Labor: Latent  2. FWB: Cat 1  3. Pain: Mod  4. GBS: Negative  5. Grand Multip  6. TOLAC  7. H/o gHTN  8. Morbid Obesity 9. RH -ve  10.Pyelonephritis  11.Rubella non immune  12. Cocaine and Barbiturate use     Plan: Admit for L&D  Cervical check Q4H  Anticipate NSVD quickly due to grand mulip.  Possible start to Pit or Cytotec for labour  MMR PP  Rhogam PP if infant Rh +  UDS, CBC, Type and screen, RPR SW consult   AJustus Memory MD Family Medicine Resident [place]  03/31/2018, 7:56 AM   I spoke with and examined patient and agree with resident/PA-S/MS/SNM's note and plan of care. H/O GHTN. Initial bp elevated, normal yesterday in clinic, will monitor. Recent drug use, will get UDS. TOLAC, had LTCS w/ twin pregnancy baby B d/t low-lying placenta/?abruption. Expectant  management KRoma Schanz CNM, [facility]3/11/2018 10:01 AM

## 2018-03-31 NOTE — Discharge Summary (Addendum)
Postpartum Discharge Summary     Patient Name: Jacqueline Clarke DOB: [birth year] MRN: [id number]  Date of admission: 03/31/2018 Delivering Provider: Aura Camps   Date of discharge: 04/02/2018  Admitting diagnosis: 31wks ctx  Intrauterine pregnancy: [redacted]w[redacted]d    Secondary diagnosis:  Principal Problem:   VBAC, delivered Active Problems:   Request for sterilization  Additional problems: History of Depression/Anxiety/Hallucinations Discharge diagnosis: Term Pregnancy Delivered, VBAC and Gestational Hypertension                                                    Post partum procedures:postpartum tubal ligation Augmentation: None Complications: None  Hospital course:  Onset of Labor With Vaginal Delivery     33y.o. yo [id number] 32w0das admitted in Latent Labor on 03/31/2018. Patient had an uncomplicated labor course as follows:  Membrane Rupture Time/Date: 6:00 AM ,03/31/2018   Intrapartum Procedures: Episiotomy: None [1]                                         Lacerations:  1st degree [2]  Patient had a delivery of a Viable infant. 03/31/2018  Information for the patient's newborn:  WaKyler, Lerette0[[id number]]Delivery Method: Vag-Spont   Patient had an uncomplicated postpartum course.  She is ambulating, tolerating a regular diet, passing flatus, and urinating well. Patient is discharged home in stable condition on 04/02/18.  Mental Health History: Patient has extensive mental health history including depression and anxiety with suicidal attempt in 2019. Social work consulted inpatient. Patient's Zoloft increased to 5054mD prior to discharge.   Magnesium Sulfate recieved: No BMZ received: No  Physical exam  Vitals:   04/01/18 2250 04/02/18 0300 04/02/18 0614 04/02/18 1015  BP: 140/85 118/83 122/85 127/89  Pulse: 86 79 68 76  Resp: '18 18 18 18  ' Temp: 98.7 F (37.1 C) 98 F (36.7 C) 98.6 F (37 C) 98.4 F (36.9 C)  TempSrc: Oral Oral Oral Oral   SpO2: 100% 98% 100% 99%  Weight:      Height:       General: alert, cooperative and no distress Lochia: appropriate Uterine Fundus: firm Incision: Healing well with no significant drainage, Dressing is clean, dry, and intact. Appropriately tender.  DVT Evaluation: No evidence of DVT seen on physical exam.No significant calf/ankle edema.  Labs: Lab Results  Component Value Date   WBC 6.8 03/31/2018   HGB 9.4 (L) 03/31/2018   HCT 32.1 (L) 03/31/2018   MCV 84.0 03/31/2018   PLT 194 03/31/2018   CMP Latest Ref Rng & Units 03/16/2018  Glucose 70 - 99 mg/dL 78  BUN 6 - 20 mg/dL 9  Creatinine 0.44 - 1.00 mg/dL 0.62  Sodium 135 - 145 mmol/L 135  Potassium 3.5 - 5.1 mmol/L 4.3  Chloride 98 - 111 mmol/L 109  CO2 22 - 32 mmol/L 20(L)  Calcium 8.9 - 10.3 mg/dL 8.8(L)  Total Protein 6.5 - 8.1 g/dL 6.3(L)  Total Bilirubin 0.3 - 1.2 mg/dL 0.7  Alkaline Phos 38 - 126 U/L 133(H)  AST 15 - 41 U/L 16  ALT 0 - 44 U/L 9    Discharge instruction: per After Visit Summary and "Baby and Me Booklet".  After visit  meds:  Allergies as of 04/02/2018   No Known Allergies     Medication List    STOP taking these medications   clindamycin 300 MG capsule Commonly known as:  Cleocin   metoCLOPramide 10 MG tablet Commonly known as:  REGLAN     TAKE these medications   cyclobenzaprine 5 MG tablet Commonly known as:  FLEXERIL Take 1 tablet (5 mg total) by mouth 3 (three) times daily as needed for muscle spasms.   ibuprofen 600 MG tablet Commonly known as:  ADVIL,MOTRIN Take 1 tablet (600 mg total) by mouth every 6 (six) hours.   measles, mumps & rubella vaccine injection Commonly known as:  MMR Inject 0.5 mLs into the skin once for 1 dose.   oxyCODONE 5 MG immediate release tablet Commonly known as:  Oxy IR/ROXICODONE Take 1 tablet (5 mg total) by mouth every 4 (four) hours as needed for moderate pain or severe pain.   senna-docusate 8.6-50 MG tablet Commonly known as:   Senokot-S Take 2 tablets by mouth daily. Start taking on:  April 03, 2018   sertraline 50 MG tablet Commonly known as:  ZOLOFT Take 1 tablet (50 mg total) by mouth daily. Start taking on:  April 03, 2018 What changed:    medication strength  how much to take   Tdap 5-2.5-18.5 LF-MCG/0.5 injection Commonly known as:  BOOSTRIX Inject 0.5 mLs into the muscle once for 1 dose.   Tylenol 325 MG tablet Generic drug:  acetaminophen Take 650 mg by mouth every 6 (six) hours as needed.   Vitafol Ultra 29-0.6-0.4-200 MG Caps Take 1 capsule by mouth daily before breakfast.            Discharge Care Instructions  (From admission, onward)         Start     Ordered   04/02/18 0000  Discharge wound care:    Comments:  Leave honeycomb dressing on for 5-7 days. You are able to get dressing wet.   04/02/18 1121          Diet: low salt diet Activity: Advance as tolerated. Pelvic rest for 6 weeks.  Outpatient follow up:4 weeks for postpartum visit, 1 week follow up for incision check and mood follow up  Follow up Appt: Future Appointments  Date Time Provider [place]  04/07/2018  2:15 PM [place] None  05/05/2018  2:00 PM Shelly Bombard, MD [place] None   Follow up Visit: [place]. Go on 04/07/2018.   Why:  at 2:15 pm for blood pressure check Contact information: [address] [place] [address] [phone]       Shelly Bombard, MD. Go on 05/05/2018.   Specialty:  Obstetrics and Gynecology Why:  at 2:00 pm for postpartum visit Contact information: [address] [place] [address] [place] [address] [phone]          Please schedule this patient for Postpartum visit in: 4 weeks with the following provider: Any provider For C/S patients schedule nurse incision check in weeks 2 weeks: no High risk pregnancy complicated by: VBAC, mental health history Delivery  mode:  SVD Anticipated Birth Control:  BTL done PP PP Procedures needed: Incision check and mood follow up in 1 week Schedule Integrated [place] visit: yes  Newborn Data: Live born female  Birth Weight: 8 lb 3.6 oz (3731 g) APGAR: 9, 9  Newborn Delivery   Time head delivered:  03/31/2018 08:41:00 Birth date/time:  03/31/2018 08:41:00 Delivery type:  VBAC, Spontaneous    Baby Feeding: Bottle and Breast Disposition:home with mother  04/02/2018 Glenice Bow, DO  Attestation: I have seen this patient and agree with the resident's documentation. I have examined them separately, and we have discussed the plan of care. Message sent to clinic to schedule follow-up in 1 week for mood check and BP check.   Lambert Mody. Juleen China, DO OB/GYN Fellow

## 2018-03-31 NOTE — Lactation Note (Signed)
This note was copied from a baby's chart. Lactation Consultation Note  Patient Name: Jacqueline Clarke [id number] Date: 03/31/2018 Reason for consult: Initial assessment;Term P7, 12 hour female infant. Mom's feeding choice at admission was breast and bottle feeding infant. Infant had 4 stools and one void since delivery. LC discussed mom's hx of drug usage and unsafe to infant to  breastfeed if she is still using " cocaine or barbiturates " , mom stated she understood and she is no longer using drugs.   Per mom, infant breastfeed well during L&D. Mom wants to breastfeed infant longer than other children, states she is getting her tubes tied tomorrow and she really want to try breastfeeding this time.  LC notice mom is short shafted and explained how to wear breast shells in bra  During the day to help extend nipple shaft out more, mom knows not to sleep in breast shells at night. Mom is actively enrolled in [facility] in [place]. Mom doesn't have a breast pump at home, [facility] gave hand pump "harmony " and explained how to use, assemble, re-assemble and clean pump parts. Mom was fitted with 30 mm flange due having large breast and nipples. This is mom third attempt to breastfeed infant. Infant had emesis large and clear with LC while in room. Infant was reluctant to breastfeed at this time after a few attempts, infant was burped took 3 ml of colostrum by spoon and 3 ml of Gerber Good start 20 kcal by spoon. Mom was doing STS when [facility] left the room. Mom knows to breastfeed infant by hunger cues, 8 or more times within 24 hours. Mom will call Nurse or LC if she has any further questions, concerns or need assistance with latching infant to breast. LC discussed I& O. Mom made aware of O/P services, breastfeeding support groups, community resources, and our phone # for post-discharge questions.  Maternal Data Formula Feeding for Exclusion: Yes Reason for exclusion: Mother's choice to formula and  breast feed on admission Has patient been taught Hand Expression?: Yes(Infant given 3 ml of colostrum by spoon.) Does the patient have breastfeeding experience prior to this delivery?: Yes  Feeding Feeding Type: Breast Fed  LATCH Score Latch: Too sleepy or reluctant, no latch achieved, no sucking elicited.  Audible Swallowing: None  Type of Nipple: Everted at rest and after stimulation  Comfort (Breast/Nipple): Soft / non-tender  Hold (Positioning): Assistance needed to correctly position infant at breast and maintain latch.  LATCH Score: 5  Interventions Interventions: Breast feeding basics reviewed;Assisted with latch;Skin to skin;Breast massage;Adjust position;Breast compression;Hand express;Support pillows;Shells;Hand pump  Lactation Tools Discussed/Used Tools: Flanges;Shells Flange Size: 30 WIC Program: Yes Pump Review: Setup, frequency, and cleaning;Milk Storage Initiated by:: Danelle Earthly, IBCLC Date initiated:: 03/31/18   Consult Status Consult Status: Follow-up Date: 04/01/18 Follow-up type: In-patient    Danelle Earthly 03/31/2018, 9:31 PM

## 2018-03-31 NOTE — MAU Note (Signed)
Pt arrives via EMS with complaint of contractions and ROM at 0600.

## 2018-04-01 ENCOUNTER — Other Ambulatory Visit: Payer: Self-pay

## 2018-04-01 ENCOUNTER — Encounter (HOSPITAL_COMMUNITY): Payer: Self-pay | Admitting: Obstetrics & Gynecology

## 2018-04-01 ENCOUNTER — Inpatient Hospital Stay (HOSPITAL_COMMUNITY): Payer: Medicaid Other | Admitting: Anesthesiology

## 2018-04-01 ENCOUNTER — Encounter (HOSPITAL_COMMUNITY)
Admission: AD | Disposition: A | Discharge status: Home / Self Care | Payer: Self-pay | Source: Home / Self Care | Attending: Family Medicine

## 2018-04-01 DIAGNOSIS — Z302 Encounter for sterilization: Secondary | ICD-10-CM

## 2018-04-01 HISTORY — PX: TUBAL LIGATION: SHX77

## 2018-04-01 SURGERY — LIGATION, FALLOPIAN TUBE, POSTPARTUM
Anesthesia: Spinal

## 2018-04-01 MED ORDER — METOCLOPRAMIDE HCL 10 MG PO TABS
10.0000 mg | ORAL_TABLET | Freq: Once | ORAL | Status: AC
  Administered 2018-04-01: 10 mg via ORAL
  Filled 2018-04-01: qty 1

## 2018-04-01 MED ORDER — DEXAMETHASONE SODIUM PHOSPHATE 4 MG/ML IJ SOLN
INTRAMUSCULAR | Status: DC | PRN
  Administered 2018-04-01: 4 mg via INTRAVENOUS

## 2018-04-01 MED ORDER — BUPIVACAINE HCL (PF) 0.5 % IJ SOLN
INTRAMUSCULAR | Status: DC | PRN
  Administered 2018-04-01: 30 mL

## 2018-04-01 MED ORDER — SODIUM CHLORIDE 0.9 % IR SOLN
Status: DC | PRN
  Administered 2018-04-01: 1

## 2018-04-01 MED ORDER — LACTATED RINGERS IV SOLN
INTRAVENOUS | Status: DC | PRN
  Administered 2018-04-01 (×2): via INTRAVENOUS

## 2018-04-01 MED ORDER — MEPERIDINE HCL 25 MG/ML IJ SOLN: 6.2500 mg | INTRAMUSCULAR | Status: DC | PRN

## 2018-04-01 MED ORDER — HYDROMORPHONE HCL 1 MG/ML IJ SOLN
0.2500 mg | INTRAMUSCULAR | Status: DC | PRN
  Administered 2018-04-01: 0.25 mg via INTRAVENOUS

## 2018-04-01 MED ORDER — MIDAZOLAM HCL 2 MG/2ML IJ SOLN
INTRAMUSCULAR | Status: AC
  Filled 2018-04-01: qty 2

## 2018-04-01 MED ORDER — MIDAZOLAM HCL 5 MG/5ML IJ SOLN
INTRAMUSCULAR | Status: DC | PRN
  Administered 2018-04-01 (×2): 1 mg via INTRAVENOUS

## 2018-04-01 MED ORDER — FAMOTIDINE 20 MG PO TABS
40.0000 mg | ORAL_TABLET | Freq: Once | ORAL | Status: AC
  Administered 2018-04-01: 40 mg via ORAL
  Filled 2018-04-01: qty 2

## 2018-04-01 MED ORDER — FENTANYL CITRATE (PF) 100 MCG/2ML IJ SOLN
INTRAMUSCULAR | Status: DC | PRN
  Administered 2018-04-01 (×2): 50 ug via INTRAVENOUS

## 2018-04-01 MED ORDER — FENTANYL CITRATE (PF) 250 MCG/5ML IJ SOLN
INTRAMUSCULAR | Status: AC
  Filled 2018-04-01: qty 5

## 2018-04-01 MED ORDER — LACTATED RINGERS IV SOLN
INTRAVENOUS | Status: DC
  Administered 2018-04-01: 20 mL/h via INTRAVENOUS

## 2018-04-01 MED ORDER — PROMETHAZINE HCL 25 MG/ML IJ SOLN: 6.2500 mg | INTRAMUSCULAR | Status: DC | PRN

## 2018-04-01 MED ORDER — SERTRALINE HCL 50 MG PO TABS
50.0000 mg | ORAL_TABLET | Freq: Every day | ORAL | Status: DC
  Administered 2018-04-02: 50 mg via ORAL
  Filled 2018-04-01: qty 1

## 2018-04-01 MED ORDER — BUPIVACAINE IN DEXTROSE 0.75-8.25 % IT SOLN
INTRATHECAL | Status: DC | PRN
  Administered 2018-04-01: 1.6 mL via INTRATHECAL

## 2018-04-01 MED ORDER — RHO D IMMUNE GLOBULIN 1500 UNIT/2ML IJ SOSY
300.0000 ug | PREFILLED_SYRINGE | Freq: Once | INTRAMUSCULAR | Status: AC
  Administered 2018-04-01: 300 ug via INTRAVENOUS
  Filled 2018-04-01: qty 2

## 2018-04-01 MED ORDER — DEXAMETHASONE SODIUM PHOSPHATE 4 MG/ML IJ SOLN
INTRAMUSCULAR | Status: AC
  Filled 2018-04-01: qty 1

## 2018-04-01 MED ORDER — ONDANSETRON HCL 4 MG/2ML IJ SOLN
INTRAMUSCULAR | Status: AC
  Filled 2018-04-01: qty 2

## 2018-04-01 MED ORDER — ACETAMINOPHEN 10 MG/ML IV SOLN
INTRAVENOUS | Status: DC | PRN
  Administered 2018-04-01: 1000 mg via INTRAVENOUS

## 2018-04-01 MED ORDER — HYDROMORPHONE HCL 1 MG/ML IJ SOLN
INTRAMUSCULAR | Status: AC
  Filled 2018-04-01: qty 0.5

## 2018-04-01 MED ORDER — KETOROLAC TROMETHAMINE 30 MG/ML IJ SOLN: 30.0000 mg | Freq: Once | INTRAMUSCULAR | Status: DC | PRN

## 2018-04-01 MED ORDER — ACETAMINOPHEN 10 MG/ML IV SOLN
INTRAVENOUS | Status: AC
  Filled 2018-04-01: qty 100

## 2018-04-01 MED ORDER — BUPIVACAINE HCL (PF) 0.5 % IJ SOLN
INTRAMUSCULAR | Status: AC
  Filled 2018-04-01: qty 30

## 2018-04-01 SURGICAL SUPPLY — 26 items
APL SKNCLS STERI-STRIP NONHPOA (GAUZE/BANDAGES/DRESSINGS)
BENZOIN TINCTURE PRP APPL 2/3 (GAUZE/BANDAGES/DRESSINGS) IMPLANT
BLADE SURG 11 STRL SS (BLADE) ×3 IMPLANT
CLIP FILSHIE TUBAL LIGA STRL (Clip) ×3 IMPLANT
CLOTH BEACON ORANGE TIMEOUT ST (SAFETY) ×3 IMPLANT
DRSG OPSITE POSTOP 3X4 (GAUZE/BANDAGES/DRESSINGS) ×3 IMPLANT
DURAPREP 26ML APPLICATOR (WOUND CARE) ×3 IMPLANT
ELECT REM PT RETURN 9FT ADLT (ELECTROSURGICAL) ×3
ELECTRODE REM PT RTRN 9FT ADLT (ELECTROSURGICAL) ×1 IMPLANT
GLOVE BIOGEL PI IND STRL 7.0 (GLOVE) ×3 IMPLANT
GLOVE BIOGEL PI INDICATOR 7.0 (GLOVE) ×6
GLOVE ECLIPSE 7.0 STRL STRAW (GLOVE) ×3 IMPLANT
GOWN STRL REUS W/TWL LRG LVL3 (GOWN DISPOSABLE) ×6 IMPLANT
NEEDLE HYPO 22GX1.5 SAFETY (NEEDLE) ×3 IMPLANT
NS IRRIG 1000ML POUR BTL (IV SOLUTION) ×3 IMPLANT
PACK ABDOMINAL MINOR (CUSTOM PROCEDURE TRAY) ×3 IMPLANT
PENCIL BUTTON HOLSTER BLD 10FT (ELECTRODE) ×3 IMPLANT
PROTECTOR NERVE ULNAR (MISCELLANEOUS) ×3 IMPLANT
SPONGE LAP 4X18 RFD (DISPOSABLE) IMPLANT
SUT VIC AB 0 CT1 27 (SUTURE) ×3
SUT VIC AB 0 CT1 27XBRD ANBCTR (SUTURE) ×1 IMPLANT
SUT VICRYL 4-0 PS2 18IN ABS (SUTURE) ×3 IMPLANT
SYR CONTROL 10ML LL (SYRINGE) ×3 IMPLANT
TOWEL OR 17X24 6PK STRL BLUE (TOWEL DISPOSABLE) ×6 IMPLANT
TRAY FOLEY CATH SILVER 14FR (SET/KITS/TRAYS/PACK) ×3 IMPLANT
WATER STERILE IRR 1000ML POUR (IV SOLUTION) ×3 IMPLANT

## 2018-04-01 NOTE — Anesthesia Preprocedure Evaluation (Signed)
Anesthesia Evaluation  Patient identified by MRN, date of birth, ID band Patient awake    Reviewed: Allergy & Precautions, NPO status , Patient's Chart, lab work & pertinent test results  Airway Mallampati: III  TM Distance: >3 FB     Dental no notable dental hx. (+) Teeth Intact   Pulmonary asthma , former smoker,    Pulmonary exam normal        Cardiovascular hypertension, negative cardio ROS Normal cardiovascular exam Rhythm:Regular Rate:Normal     Neuro/Psych PSYCHIATRIC DISORDERS Anxiety Depression    GI/Hepatic negative GI ROS, Neg liver ROS,   Endo/Other  Morbid obesity  Renal/GU negative Renal ROS     Musculoskeletal   Abdominal (+) + obese,   Peds  Hematology  (+) anemia ,   Anesthesia Other Findings   Reproductive/Obstetrics                             Lab Results  Component Value Date   WBC 6.8 03/31/2018   HGB 9.4 (L) 03/31/2018   HCT 32.1 (L) 03/31/2018   MCV 84.0 03/31/2018   PLT 194 03/31/2018    Anesthesia Physical  Anesthesia Plan  ASA: III  Anesthesia Plan: Spinal   Post-op Pain Management:    Induction:   PONV Risk Score and Plan: 4 or greater and Ondansetron, Dexamethasone, Scopolamine patch - Pre-op and Midazolam  Airway Management Planned: Natural Airway and Nasal Cannula  Additional Equipment:   Intra-op Plan:   Post-operative Plan:   Informed Consent: I have reviewed the patients History and Physical, chart, labs and discussed the procedure including the risks, benefits and alternatives for the proposed anesthesia with the patient or authorized representative who has indicated his/her understanding and acceptance.       Plan Discussed with: CRNA  Anesthesia Plan Comments:         Anesthesia Quick Evaluation

## 2018-04-01 NOTE — Lactation Note (Signed)
This note was copied from a baby's chart. Lactation Consultation Note  Patient Name: Jacqueline Clarke Date: 04/01/2018 Reason for consult: Follow-up assessment;Term;Infant weight loss  36 hours old FT female who is being partially BF and formula fed by his mother, she's a P7. Mom called for latch assistance. LC assisted with latch taking baby STS in cross cradle position to mother's left breast and he was able to latch easily but kept unlatching, so required constant repositioning, no swallows heard at this point. Baby still nursing when exiting the room at the 6 minutes mark.  Mom has been doing lots of bottles, baby is on [employer]. She's also using her hand pump to help her get some drops, but she voiced that it's not "enough" and that she'll continue supplementing baby with Rush Barer Gentle. Breastmilk storage guidelines were reviewed.  Feeding plan:  1. Encouraged mom to feed baby STS 8-12 times/24 hours or sooner if feeding cues are present 2. Hand expression and pre-pumping prior feeding was encouraged as well; mom will spoon feed baby any drops of colostrum she may get before offering formula 3. She'll continue supplementing baby with Rush Barer Gentle according to formula supplementation guidelines per baby's age  Mom reported all questions and concerns were answered, she's aware of LC services and will call PRN.  Maternal Data    Feeding Feeding Type: Breast Fed Nipple Type: Slow - flow  LATCH Score Latch: Repeated attempts needed to sustain latch, nipple held in mouth throughout feeding, stimulation needed to elicit sucking reflex.  Audible Swallowing: None  Type of Nipple: Everted at rest and after stimulation  Comfort (Breast/Nipple): Soft / non-tender  Hold (Positioning): Assistance needed to correctly position infant at breast and maintain latch.  LATCH Score: 6  Interventions Interventions: Breast feeding basics reviewed;Assisted with latch;Skin to  skin;Breast massage;Hand express;Breast compression;Adjust position;Support pillows  Lactation Tools Discussed/Used     Consult Status Consult Status: PRN Follow-up type: In-patient    Jacqueline Clarke 04/01/2018, 8:56 PM

## 2018-04-01 NOTE — Op Note (Signed)
Jacqueline Clarke 04/01/2018  PREOPERATIVE DIAGNOSES: Multiparity, undesired fertility  POSTOPERATIVE DIAGNOSES: Multiparity, undesired fertility  PROCEDURE:  Postpartum Bilateral Tubal Sterilization using Filshie Clips   SURGEON: Dr. Jaynie Collins  ANESTHESIA:  Spinal and local analgesia using 30 ml of 0.5% Marcaine  COMPLICATIONS:  None immediate.  ESTIMATED BLOOD LOSS: 10 ml.  INDICATIONS:  33 y.o. [id number] with undesired fertility, status post vaginal delivery, desires permanent sterilization.  Other reversible forms of contraception were discussed with patient; she declines all other modalities. Risks of procedure discussed with patient including but not limited to: risk of regret, permanence of method, bleeding, infection, injury to surrounding organs and need for additional procedures.  Failure risk of 1-2 % with increased risk of ectopic gestation if pregnancy occurs and possibility of post-tubal pain syndrome also discussed with patient.     FINDINGS:  Normal uterus, tubes, and ovaries.  PROCEDURE DETAILS: The patient was taken to the operating room where her epidural anesthesia was dosed up to surgical level and found to be adequate.  She was then placed in the dorsal supine position and prepped and draped in sterile fashion.  After an adequate timeout was performed, attention was turned to the patient's abdomen where a small transverse skin incision was made under the umbilical fold. The incision was taken down to the layer of fascia using the scalpel, and fascia was incised, and extended bilaterally using Mayo scissors. The peritoneum was entered in a sharp fashion. Attention was then turned to the patient's uterus, and left fallopian tube was identified and followed out to the fimbriated end.  A Filshie clip was placed on the left fallopian tube about 3 cm from the cornual attachment, with care given to incorporate the underlying mesosalpinx.  A similar process was carried out  on the right side allowing for bilateral tubal sterilization.  Good hemostasis was noted overall.  Local analgesia was injected into both Filshie application sites.The instruments were then removed from the patient's abdomen and the fascial incision was repaired with 0 Vicryl, and the skin was closed with a 4-0 Vicryl subcuticular stitch. The patient tolerated the procedure well.  Instrument, sponge, and needle counts were correct times three.  The patient was then taken to the recovery room awake and in stable condition.   Jaynie Collins, MD, FACOG Obstetrician & Gynecologist, [facility] for [employer], [facility]

## 2018-04-01 NOTE — Progress Notes (Addendum)
POSTPARTUM PROGRESS NOTE  Post Partum Day 1  Subjective:  MELADY CHOW is a 33 y.o. [id number] s/p SVD at [redacted]w[redacted]d  She reports she is doing well. No acute events overnight. She denies any problems with ambulating, voiding or po intake. Denies nausea or vomiting.  Pain is well controlled.  Lochia is mild.  Objective: Blood pressure 105/62, pulse 85, temperature 98.6 F (37 C), temperature source Oral, resp. rate 16, height '5\' 11"'  (1.803 m), weight (!) 141.9 kg, last menstrual period 07/22/2017, SpO2 98 %, unknown if currently breastfeeding.  Physical Exam:  General: alert, cooperative and no distress Chest: no respiratory distress Heart:regular rate, distal pulses intact Abdomen: soft, nontender,  Uterine Fundus: firm, appropriately tender DVT Evaluation: No calf swelling or tenderness Extremities: mild LE edema Skin: warm, dry  Recent Labs    03/31/18 0820  HGB 9.4*  HCT 32.1*    Assessment/Plan: Sapna L WElizondois a 33y.o. [id number]/p SVD at 345w0d PPD#1 - Doing well, VSS  Routine postpartum care  Baby is O+ - Patient to get Postpartum Rhogam   Patient to get MMR vaccine prior to discharge Contraception: BTL scheduled for 1500 today Feeding: both Dispo: Plan for discharge tomorrow.   LOS: 1 day   KiMina MarbleD.O. CoCrows NestPGY1 04/01/2018, 12:19 PM   Attestation of Attending Supervision of Resident: Evaluation and management procedures were performed by the [facility] Resident under my supervision. I was immediately available for direct supervision, assistance and direction throughout this encounter.  I also confirm that I have verified the information documented in the resident's note, and that I have also personally reperformed the pertinent components of the physical exam and all of the medical decision making activities.    Patient desires permanent sterilization.  Other reversible forms of contraception were discussed with  patient; she declines all other modalities. Risks of procedure discussed with patient including but not limited to: risk of regret, permanence of method, bleeding, infection, injury to surrounding organs and need for additional procedures.  Failure risk of about 1% with increased risk of ectopic gestation if pregnancy occurs was also discussed with patient. Also discussed possibility of post-tubal pain syndrome.  Patient verbalized understanding of these risks and wants to proceed with sterilization.  Written informed consent obtained.  To OR when ready.  UGVerita SchneidersMD, FAWaite Hillttending ObHeritage CreekFaVa Medical Center [facility], [place] [employer] Company[employer]

## 2018-04-01 NOTE — Transfer of Care (Signed)
Immediate Anesthesia Transfer of Care Note  Patient: Jacqueline Clarke  Procedure(s) Performed: POST PARTUM TUBAL LIGATION (N/A )  Patient Location: PACU  Anesthesia Type:Spinal  Level of Consciousness: awake, alert  and oriented  Airway & Oxygen Therapy: Patient Spontanous Breathing  Post-op Assessment: Report given to RN and Post -op Vital signs reviewed and stable  Post vital signs: Reviewed and stable   Last Vitals:  Vitals Value Taken Time  BP 119/68 04/01/2018  4:37 PM  Temp 36.7 C 04/01/2018  4:35 PM  Pulse 78 04/01/2018  4:38 PM  Resp 19 04/01/2018  4:38 PM  SpO2 100 % 04/01/2018  4:38 PM  Vitals shown include unvalidated device data.  Last Pain:  Vitals:   04/01/18 1635  TempSrc: Oral  PainSc: 0-No pain         Complications: No apparent anesthesia complications

## 2018-04-01 NOTE — Lactation Note (Signed)
This note was copied from a baby's chart. Lactation Consultation Note  Patient Name: Jacqueline Clarke [id number] Date: 04/01/2018 Reason for consult: Follow-up assessment;Term  Visited with P7 Mom of term baby at 19 hrs old.  Mom had just fed baby 29 ml formula by bottle.  Mom about to go to OR for BTL.  Talked about pumping if baby having difficulty latching to the breast.  Mom will request help as baby starts cueing he is hungry.   Interventions Interventions: Breast feeding basics reviewed;Skin to skin;Breast massage;Hand express;Hand pump;Shells  Lactation Tools Discussed/Used Tools: Pump;Shells Flange Size: 30 Shell Type: Inverted Breast pump type: Manual   Consult Status Consult Status: Follow-up Date: 04/02/18 Follow-up type: In-patient    Judee Clara 04/01/2018, 2:54 PM

## 2018-04-01 NOTE — Anesthesia Procedure Notes (Signed)
Spinal  Patient location during procedure: OR Start time: 04/01/2018 3:36 PM End time: 04/01/2018 3:40 PM Staffing Anesthesiologist: Leilani Able, MD Performed: anesthesiologist  Preanesthetic Checklist Completed: patient identified, site marked, surgical consent, pre-op evaluation, timeout performed, IV checked, risks and benefits discussed and monitors and equipment checked Spinal Block Patient position: sitting Prep: site prepped and draped and DuraPrep Patient monitoring: continuous pulse ox and blood pressure Approach: midline Location: L3-4 Injection technique: single-shot Needle Needle type: Pencan  Needle gauge: 24 G Needle length: 10 cm Needle insertion depth: 7 cm Assessment Sensory level: T6

## 2018-04-01 NOTE — Clinical Social Work Maternal (Signed)
CLINICAL SOCIAL WORK MATERNAL/CHILD NOTE  Patient Details  Name: Jacqueline Clarke MRN: [id number] Date of Birth: [birth year]  Date:  04/01/2018  Clinical Social Worker Initiating Note:  Laurey Arrow Date/Time: Initiated:  04/01/18/1422     Child's Name:  Tami Ribas   Biological Parents:  Mother, Father   Need for Interpreter:  None   Reason for Referral:  Behavioral Health Concerns, Current Substance Use/Substance Use During Pregnancy    Address:  [address] [place] [address]    Phone number:  [phone] (home)     Additional phone number:   Household Members/Support Persons (HM/SP):   Household Member/Support Person 1, Household Member/Support Person 2, Household Member/Support Person 3, Household Member/Support Person 4, Household Member/Support Person 5, Household Member/Support Person 37, Household Member/Support Person 7   HM/SP Name Relationship DOB or Age  HM/SP -69 Cleon Gustin Porada daughter 12/12/08  HM/SP -2 [name] Sr. FOB 02/06/1985  HM/SP -[place]. Son 12/31/09  HM/SP -4 Nanda Quinton daughter 07/22/2011  HM/SP -5 Cherlyn Roberts daughter 08/22/2015  HM/SP -6 Patrick Jupiter son 6/4/20199  HM/SP -7 Jy'Ceion Simpson son 06/23/2017  HM/SP -8          Natural Supports (not living in the home):  Parent, Friends, Immediate Family   Professional Supports: (S) Therapist, Case Manager/Social Worker(MOB reports being an established patient with [facility]. )   Employment: Part-time   Type of Work: MOB works at [employer] on [employer]   Education:  9 to 11 years   Homebound arranged: No  Financial Resources:  [employer]   Other Resources:  Physicist, medical , Banks Considerations Which May Impact Care:    Strengths:  Ability to meet basic needs , Home prepared for child , Understanding of illness, Pediatrician chosen   Psychotropic Medications:         Pediatrician:    Solicitor  area  Pediatrician List:   [facility] for [place]      Pediatrician Fax Number:    Risk Factors/Current Problems:  Substance Use , Mental Health Concerns    Cognitive State:  Linear Thinking , Alert , Insightful , Goal Oriented    Mood/Affect:  Calm , Tearful , Relaxed , Interested , Comfortable    CSW Assessment: CSW met with MOB to complete an assessment for MH hx and substance abuse hx.  When CSW arrived, MOB was resting in bed breastfeeding infant.  MOB and infant appeared comfortable. MOB appropriately responded to infant's cues throughout the assessment.  MOB was polite, forthcoming, and easy to engage.    CSW asked about MOB's MH hx and MOB openly shared her dx of depression/anxiety, and bipolar disorder.  Per MOB, MOB was dx with bipolar in 2010 while incarcerated. MOB reported after incarceration MOB had little to no symptoms until 2013.  MOB reports being dx with anxiety and depression and reported managing symptoms via natural interventions. CSW provided education regarding the baby blues period vs. perinatal mood disorders, discussed treatment and gave resources for mental health follow up if concerns arise.  CSW recommends self-evaluation during the postpartum time period using the New Mom Checklist from Postpartum Progress and encouraged MOB to contact a medical professional if symptoms are noted at any time.  MOB communicated signs of PMADs after the birth of her twins in 2019.  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  I tried to overdose with cocaine, iron pills, and tylenol."  MOB shared that use of marijuana during her early 20's and denied the use of all substances after the age of 60 until her attempted overdose in 2019.  MOB stated feeling depressed and could not verbalize her source of depression.  Per MOB, MOB was hospitalized at [facility] (inpatient) for 10 days. MOB was referred to [facility] for outpatient counseling and medication management . MOB reported currently taking Zoloft however needs a new Rx; CSW agreed to speak with MOB's bedside nurse to get MOB evaluated for medication (CSW informed bedside nurse). CSW assessed for safety and MOB denied SI, HI, and DV.  MOB reported having a good support team that consists of MOB's and FOB's immediate and extended family members.  CSW made MOB aware of hospital's SA policy and MOB was understanding. MOB is aware that infant's UDS was negative and that CSW will continue to monitor infant's CDS.  MOB denied CPS hx.   CSW provided review of Sudden Infant Death Syndrome (SIDS) precautions.    CSW Plan/Description:  No Further Intervention Required/No Barriers to Discharge, Sudden Infant Death Syndrome (SIDS) Education, Other Information/Referral to [employer], Perinatal Mood and Anxiety Disorder (PMADs) Education, Other Patient/Family Education, [place], CSW Will Continue to Monitor Umbilical Cord Tissue Drug Screen Results and Make Report if Warranted   Laurey Arrow, MSW, LCSW Clinical Social Work [phone]   Dimple Nanas, LCSW 04/01/2018, 2:31 PM

## 2018-04-02 LAB — RH IG WORKUP (INCLUDES ABO/RH)
ABO/RH(D): O NEG
Fetal Screen: NEGATIVE
Gestational Age(Wks): 39
Unit division: 0

## 2018-04-02 MED ORDER — IBUPROFEN 600 MG PO TABS: 600.0000 mg | ORAL_TABLET | Freq: Four times a day (QID) | ORAL | 1 refills | Status: DC

## 2018-04-02 MED ORDER — OXYCODONE HCL 5 MG PO TABS: 5.0000 mg | ORAL_TABLET | ORAL | 0 refills | Status: DC | PRN

## 2018-04-02 MED ORDER — TETANUS-DIPHTH-ACELL PERTUSSIS 5-2.5-18.5 LF-MCG/0.5 IM SUSP: 0.5000 mL | Freq: Once | INTRAMUSCULAR | 0 refills | Status: AC

## 2018-04-02 MED ORDER — MEASLES, MUMPS & RUBELLA VAC IJ SOLR: 0.5000 mL | Freq: Once | INTRAMUSCULAR | 0 refills | Status: AC

## 2018-04-02 MED ORDER — SENNOSIDES-DOCUSATE SODIUM 8.6-50 MG PO TABS: 2.0000 | ORAL_TABLET | ORAL | 0 refills | Status: DC

## 2018-04-02 MED ORDER — SERTRALINE HCL 50 MG PO TABS: 50.0000 mg | ORAL_TABLET | Freq: Every day | ORAL | 0 refills | Status: DC

## 2018-04-02 NOTE — Anesthesia Postprocedure Evaluation (Signed)
Anesthesia Post Note  Patient: Jacqueline Clarke  Procedure(s) Performed: POST PARTUM TUBAL LIGATION (N/A )     Patient location during evaluation: Mother Baby Anesthesia Type: Spinal Level of consciousness: awake and alert Pain management: pain level controlled Vital Signs Assessment: post-procedure vital signs reviewed and stable Respiratory status: spontaneous breathing, nonlabored ventilation and respiratory function stable Cardiovascular status: stable Postop Assessment: no headache, no backache, no apparent nausea or vomiting, patient able to bend at knees, able to ambulate, spinal receding and adequate PO intake Anesthetic complications: no    Last Vitals:  Vitals:   04/02/18 0300 04/02/18 0614  BP: 118/83 122/85  Pulse: 79 68  Resp: 18 18  Temp: 36.7 C 37 C  SpO2: 98% 100%    Last Pain:  Vitals:   04/02/18 0614  TempSrc: Oral  PainSc:    Pain Goal:                   Laban Emperor

## 2018-04-02 NOTE — Discharge Instructions (Signed)
Vaginal Delivery, Care After °Refer to this sheet in the next few weeks. These instructions provide you with information about caring for yourself after vaginal delivery. Your health care provider may also give you more specific instructions. Your treatment has been planned according to current medical practices, but problems sometimes occur. Call your health care provider if you have any problems or questions. °What can I expect after the procedure? °After vaginal delivery, it is common to have: °· Some bleeding from your vagina. °· Soreness in your abdomen, your vagina, and the area of skin between your vaginal opening and your anus (perineum). °· Pelvic cramps. °· Fatigue. °Follow these instructions at home: °Medicines °· Take over-the-counter and prescription medicines only as told by your health care provider. °· If you were prescribed an antibiotic medicine, take it as told by your health care provider. Do not stop taking the antibiotic until it is finished. °Driving ° °· Do not drive or operate heavy machinery while taking prescription pain medicine. °· Do not drive for 24 hours if you received a sedative. °Lifestyle °· Do not drink alcohol. This is especially important if you are breastfeeding or taking medicine to relieve pain. °· Do not use tobacco products, including cigarettes, chewing tobacco, or e-cigarettes. If you need help quitting, ask your health care provider. °Eating and drinking °· Drink at least 8 eight-ounce glasses of water every day unless you are told not to by your health care provider. If you choose to breastfeed your baby, you may need to drink more water than this. °· Eat high-fiber foods every day. These foods may help prevent or relieve constipation. High-fiber foods include: °? Whole grain cereals and breads. °? Brown rice. °? Beans. °? Fresh fruits and vegetables. °Activity °· Return to your normal activities as told by your health care provider. Ask your health care provider what  activities are safe for you. °· Rest as much as possible. Try to rest or take a nap when your baby is sleeping. °· Do not lift anything that is heavier than your baby or 10 lb (4.5 kg) until your health care provider says that it is safe. °· Talk with your health care provider about when you can engage in sexual activity. This may depend on your: °? Risk of infection. °? Rate of healing. °? Comfort and desire to engage in sexual activity. °Vaginal Care °· If you have an episiotomy or a vaginal tear, check the area every day for signs of infection. Check for: °? More redness, swelling, or pain. °? More fluid or blood. °? Warmth. °? Pus or a bad smell. °· Do not use tampons or douches until your health care provider says this is safe. °· Watch for any blood clots that may pass from your vagina. These may look like clumps of dark red, brown, or black discharge. °General instructions °· Keep your perineum clean and dry as told by your health care provider. °· Wear loose, comfortable clothing. °· Wipe from front to back when you use the toilet. °· Ask your health care provider if you can shower or take a bath. If you had an episiotomy or a perineal tear during labor and delivery, your health care provider may tell you not to take baths for a certain length of time. °· Wear a bra that supports your breasts and fits you well. °· If possible, have someone help you with household activities and help care for your baby for at least a few days after you   leave the hospital.  Keep all follow-up visits for you and your baby as told by your health care provider. This is important. Contact a health care provider if:  You have: ? Vaginal discharge that has a bad smell. ? Difficulty urinating. ? Pain when urinating. ? A sudden increase or decrease in the frequency of your bowel movements. ? More redness, swelling, or pain around your episiotomy or vaginal tear. ? More fluid or blood coming from your episiotomy or vaginal  tear. ? Pus or a bad smell coming from your episiotomy or vaginal tear. ? A fever. ? A rash. ? Little or no interest in activities you used to enjoy. ? Questions about caring for yourself or your baby.  Your episiotomy or vaginal tear feels warm to the touch.  Your episiotomy or vaginal tear is separating or does not appear to be healing.  Your breasts are painful, hard, or turn red.  You feel unusually sad or worried.  You feel nauseous or you vomit.  You pass large blood clots from your vagina. If you pass a blood clot from your vagina, save it to show to your health care provider. Do not flush blood clots down the toilet without having your health care provider look at them.  You urinate more than usual.  You are dizzy or light-headed.  You have not breastfed at all and you have not had a menstrual period for 12 weeks after delivery.  You have stopped breastfeeding and you have not had a menstrual period for 12 weeks after you stopped breastfeeding. Get help right away if:  You have: ? Pain that does not go away or does not get better with medicine. ? Chest pain. ? Difficulty breathing. ? Blurred vision or spots in your vision. ? Thoughts about hurting yourself or your baby.  You develop pain in your abdomen or in one of your legs.  You develop a severe headache.  You faint.  You bleed from your vagina so much that you fill two sanitary pads in one hour. This information is not intended to replace advice given to you by your health care provider. Make sure you discuss any questions you have with your health care provider. Document Released: 01/04/2000 Document Revised: 06/20/2015 Document Reviewed: 01/21/2015 Elsevier Interactive Patient Education  2019 [employer].  Places to have your son circumcised:    [facility] [phone] while you are in hospital  [facility] [phone] $244 by 4 wks  [facility] [phone] $175 by 2 wks  [facility] [phone] $250 by 7 days [facility] [phone] $269 by 4 wks  These prices sometimes change but are roughly what you can expect to pay. Please call and confirm pricing.   Circumcision is considered an elective/non-medically necessary procedure. There are many reasons parents decide to have their sons circumsized. During the first year of life circumcised males have a reduced risk of urinary tract infections but after this year the rates between circumcised males and uncircumcised males are the same.  It is safe to have your son circumcised outside of the hospital and the places above perform them regularly.   Deciding about Circumcision in Baby Boys  (Up-to-date The Basics)  What is circumcision?  Circumcision is a surgery that removes the skin that covers the tip of the penis, called the "foreskin" Circumcision is usually done when a boy is between 76 and 25 days old. In the [place], circumcision is common. In some other countries, fewer boys are circumcised.  Circumcision is a common tradition in some religions.  Should I have my baby boy circumcised?  There is no easy answer. Circumcision has some benefits. But it also has risks. After talking with your doctor, you will have to decide for yourself what is right for your family.  What are the benefits of circumcision?  Circumcised boys seem to have slightly lower rates of: ?Urinary tract infections ?Swelling of the opening at the tip of the penis Circumcised men seem to have slightly lower rates of: ?Urinary tract infections ?Swelling of the opening at the tip of the penis ?Penis cancer ?HIV and other infections that you catch during sex ?Cervical cancer in the women they have sex with Even so, in the [place], the risks of these problems are small - even in  boys and men who have not been circumcised. Plus, boys and men who are not circumcised can reduce these extra risks by: ?Cleaning their penis well ?Using condoms during sex  What are the risks of circumcision?  Risks include: ?Bleeding or infection from the surgery ?Damage to or amputation of the penis ?A chance that the doctor will cut off too much or not enough of the foreskin ?A chance that sex won't feel as good later in life Only about 1 out of every 200 circumcisions leads to problems. There is also a chance that your health insurance won't pay for circumcision.  How is circumcision done in baby boys?  First, the baby gets medicine for pain relief. This might be a cream on the skin or a shot into the base of the penis. Next, the doctor cleans the baby's penis well. Then he or she uses special tools to cut off the foreskin. Finally, the doctor wraps a bandage (called gauze) around the baby's penis. If you have your baby circumcised, his doctor or nurse will give you instructions on how to care for him after the surgery. It is important that you follow those instructions carefully.

## 2018-04-02 NOTE — Lactation Note (Signed)
This note was copied from a baby's chart. Lactation Consultation Note  Patient Name: Jacqueline Clarke [id number] Date: 04/02/2018 Reason for consult: Follow-up assessment;Term;Infant weight loss;Difficult latch;Other (Comment)(on the left breast - using shells and ahnd pump - see LC note )  Baby is 50 hours old  Baby breast / formula. ( more bottles than breast )  Baby recently fed and is presently sound asleep- unable to assess a latch, the MBURN latch score this am was 5.  Per mom the baby can latch on the right breast well and he opens his mouth wide/ the left is difficult.  LC assessed breast tissue with mom permission and noted the right areola to be compressible with edema.  The #24 Flange fit well and mom comfortable. The left areola is semi compressible and semi tough/ LC recommended Using a dab of coconut oil to soften tissue / and pre-pump after breast massage, hand express and reverse pressure.  And start with the #24 F if snug use the #27 F.  Sore nipple and engorgement prevention and tx reviewed.  Mom has hand pump/ shells/ and the #24 F / #27 / and #59F .  Mother informed of post-discharge support and given phone number to the lactation department, including services for phone call assistance; out-patient appointments; and breastfeeding support group. List of other breastfeeding resources in the community given in the handout. Encouraged mother to call for problems or concerns related to breastfeeding.    Maternal Data Has patient been taught Hand Expression?: Yes(MAI reviewed )  Feeding Feeding Type: (baby recently fed ) Nipple Type: Slow - flow  LATCH Score Latch: Repeated attempts needed to sustain latch, nipple held in mouth throughout feeding, stimulation needed to elicit sucking reflex.  Audible Swallowing: A few with stimulation  Type of Nipple: Flat  Comfort (Breast/Nipple): Filling, red/small blisters or bruises, mild/mod discomfort  Hold (Positioning):  Assistance needed to correctly position infant at breast and maintain latch.  LATCH Score: 5  Interventions Interventions: Breast feeding basics reviewed;Shells;Hand pump  Lactation Tools Discussed/Used Tools: Shells;Pump Flange Size: 24;30;27;Other (comment)(LC flet the #30 F was to big , see LC note ) Shell Type: Inverted Breast pump type: Manual   Consult Status Consult Status: Complete Date: 04/02/18    Kathrin Greathouse 04/02/2018, 11:16 AM

## 2018-04-02 NOTE — Progress Notes (Addendum)
CSW provided MOB with a baby box.    Arryn Terrones Boyd-Gilyard, MSW, LCSW Clinical Social Work [phone] 

## 2018-04-04 ENCOUNTER — Inpatient Hospital Stay (EMERGENCY_DEPARTMENT_HOSPITAL)
Admission: AD | Admit: 2018-04-04 | Discharge: 2018-04-04 | Disposition: A | Discharge status: Home / Self Care | Payer: Medicaid Other | Source: Home / Self Care | Attending: Obstetrics and Gynecology | Admitting: Obstetrics and Gynecology

## 2018-04-04 ENCOUNTER — Inpatient Hospital Stay (HOSPITAL_BASED_OUTPATIENT_CLINIC_OR_DEPARTMENT_OTHER): Payer: Medicaid Other

## 2018-04-04 ENCOUNTER — Inpatient Hospital Stay (HOSPITAL_COMMUNITY): Payer: Medicaid Other

## 2018-04-04 DIAGNOSIS — I809 Phlebitis and thrombophlebitis of unspecified site: Secondary | ICD-10-CM

## 2018-04-04 DIAGNOSIS — G8918 Other acute postprocedural pain: Secondary | ICD-10-CM | POA: Insufficient documentation

## 2018-04-04 DIAGNOSIS — O870 Superficial thrombophlebitis in the puerperium: Secondary | ICD-10-CM

## 2018-04-04 DIAGNOSIS — G44209 Tension-type headache, unspecified, not intractable: Secondary | ICD-10-CM | POA: Insufficient documentation

## 2018-04-04 DIAGNOSIS — R202 Paresthesia of skin: Secondary | ICD-10-CM

## 2018-04-04 DIAGNOSIS — Z87891 Personal history of nicotine dependence: Secondary | ICD-10-CM | POA: Insufficient documentation

## 2018-04-04 DIAGNOSIS — I82409 Acute embolism and thrombosis of unspecified deep veins of unspecified lower extremity: Secondary | ICD-10-CM | POA: Diagnosis not present

## 2018-04-04 DIAGNOSIS — O9089 Other complications of the puerperium, not elsewhere classified: Secondary | ICD-10-CM | POA: Insufficient documentation

## 2018-04-04 DIAGNOSIS — R109 Unspecified abdominal pain: Secondary | ICD-10-CM | POA: Insufficient documentation

## 2018-04-04 LAB — CBC
HCT: 30 % — ABNORMAL LOW (ref 36.0–46.0)
Hemoglobin: 9 g/dL — ABNORMAL LOW (ref 12.0–15.0)
MCH: 24.7 pg — ABNORMAL LOW (ref 26.0–34.0)
MCHC: 30 g/dL (ref 30.0–36.0)
MCV: 82.2 fL (ref 80.0–100.0)
Platelets: 219 10*3/uL (ref 150–400)
RBC: 3.65 MIL/uL — ABNORMAL LOW (ref 3.87–5.11)
RDW: 15.6 % — ABNORMAL HIGH (ref 11.5–15.5)
WBC: 9.6 10*3/uL (ref 4.0–10.5)
nRBC: 0 % (ref 0.0–0.2)

## 2018-04-04 LAB — COMPREHENSIVE METABOLIC PANEL
ALT: 48 U/L — ABNORMAL HIGH (ref 0–44)
ANION GAP: 12 (ref 5–15)
AST: 88 U/L — ABNORMAL HIGH (ref 15–41)
Albumin: 2.5 g/dL — ABNORMAL LOW (ref 3.5–5.0)
Alkaline Phosphatase: 203 U/L — ABNORMAL HIGH (ref 38–126)
BUN: 8 mg/dL (ref 6–20)
CO2: 24 mmol/L (ref 22–32)
Calcium: 8.8 mg/dL — ABNORMAL LOW (ref 8.9–10.3)
Chloride: 103 mmol/L (ref 98–111)
Creatinine, Ser: 0.71 mg/dL (ref 0.44–1.00)
GFR calc Af Amer: >60 mL/min (ref >60)
GFR calc non Af Amer: >60 mL/min (ref >60)
Glucose, Bld: 95 mg/dL (ref 70–99)
Potassium: 3.9 mmol/L (ref 3.5–5.1)
Sodium: 139 mmol/L (ref 135–145)
Total Bilirubin: 0.5 mg/dL (ref 0.3–1.2)
Total Protein: 5.4 g/dL — ABNORMAL LOW (ref 6.5–8.1)

## 2018-04-04 LAB — TYPE AND SCREEN
ABO/RH(D): O NEG
Antibody Screen: POSITIVE
Unit division: 0
Unit division: 0

## 2018-04-04 LAB — BPAM RBC
Blood Product Expiration Date: 202003232359
Blood Product Expiration Date: 202003232359
ISSUE DATE / TIME: 202003100205
ISSUE DATE / TIME: 202003100205
Unit Type and Rh: 9500
Unit Type and Rh: 9500

## 2018-04-04 MED ORDER — KETOROLAC TROMETHAMINE 60 MG/2ML IM SOLN
60.0000 mg | Freq: Once | INTRAMUSCULAR | Status: AC
  Administered 2018-04-04: 60 mg via INTRAMUSCULAR
  Filled 2018-04-04: qty 2

## 2018-04-04 MED ORDER — FUROSEMIDE 20 MG PO TABS
20.0000 mg | ORAL_TABLET | Freq: Every day | ORAL | Status: AC
  Administered 2018-04-04: 20 mg via ORAL
  Filled 2018-04-04: qty 1

## 2018-04-04 MED ORDER — HYDROMORPHONE HCL 1 MG/ML IJ SOLN
1.0000 mg | Freq: Once | INTRAMUSCULAR | Status: AC
  Administered 2018-04-04: 1 mg via INTRAVENOUS
  Filled 2018-04-04: qty 1

## 2018-04-04 MED ORDER — IOHEXOL 300 MG/ML  SOLN
100.0000 mL | Freq: Once | INTRAMUSCULAR | Status: AC | PRN
  Administered 2018-04-04: 100 mL via INTRAVENOUS

## 2018-04-04 MED ORDER — IOHEXOL 300 MG/ML  SOLN: 25.0000 mL | INTRAMUSCULAR | Status: AC

## 2018-04-04 NOTE — MAU Note (Signed)
Patient reports lower abdominal pain and lower back pain that started intensifying yesterday morning.  Increased pain in her right side that wraps around.  Passing large clots (3-4) since yesterday as well.  She states her bleeding had greatly improved when she went home and didn't get worse until yesterday.  Also reports BLE swelling and pain and numbness in her lower right leg.  HA that started intermittently since 0830 yesterday morning.    Reports she has been taking her pain meds on a schedule and they aren't helping.

## 2018-04-04 NOTE — Discharge Instructions (Signed)
Postpartum Tubal Ligation, Care After Refer to this sheet in the next few weeks. These instructions provide you with information about caring for yourself after your procedure. Your health care provider may also give you more specific instructions. Your treatment has been planned according to current medical practices, but problems sometimes occur. Call your health care provider if you have any problems or questions after your procedure. What can I expect after the procedure? After the procedure, it is common to have:  A sore throat.  Bruising or pain in your back.  Nausea or vomiting.  Dizziness.  Mild abdominal discomfort or pain, such as cramping, gas pain, or feeling bloated.  Soreness where the incision was made.  Tiredness.  Pain in your shoulders. Follow these instructions at home: Medicines  Take over-the-counter and prescription medicines only as told by your health care provider.  Do not take aspirin because it can cause bleeding.  Do not drive or operate heavy machinery while taking prescription pain medicine. Activity  Rest for the rest of the day.  Gradually return to your normal activities over the next few days.  Do not have sex, douche, or put a tampon or anything else in your vagina for 6 weeks or as long as told by your health care provider.  Do not lift anything that is heavier than your baby for 2 weeks or as long as told by your health care provider. Incision care      Follow instructions from your health care provider about how to take care of your incision. Make sure you: ? Wash your hands with soap and water before you change your bandage (dressing). If soap and water are not available, use hand sanitizer. ? Change your dressing as told by your health care provider. ? Leave stitches (sutures) in place. They may need to stay in place for 2 weeks or longer.  Check your incision area every day for signs of infection. Check for: ? More redness,  swelling, or pain. ? More fluid or blood. ? Warmth. ? Pus or a bad smell. Other Instructions  Do not take baths, swim, or use a hot tub until your health care provider approves. You may take showers.  Keep all follow-up visits as told by your health care provider. This is important. Contact a health care provider if:  You have more redness, swelling, or pain around your incision.  Your incision feels warm to the touch.  You have pus or a bad smell coming from your incision.  The edges of your incision break open after the sutures have been removed.  Your pain does not improve after 2-3 days.  You have a rash.  You repeatedly become dizzy or lightheaded.  Your pain medicine is not helping.  You are constipated. Get help right away if:  You have a fever.  You faint.  You have pain in your abdomen that gets worse.  You have fluid or blood coming from your sutures.  You have shortness of breath or difficulty breathing.  You have chest pain or leg pain.  You have ongoing nausea or diarrhea. This information is not intended to replace advice given to you by your health care provider. Make sure you discuss any questions you have with your health care provider. Document Released: 07/08/2011 Document Revised: 09/02/2016 Document Reviewed: 12/17/2014 Elsevier Interactive Patient Education  2019 [employer].   Thrombophlebitis Thrombophlebitis is a condition in which a blood clot forms in a vein. This can happen in your  arms or legs, or in the area between your neck and groin (torso). When this condition happens in a vein that is close to the surface of the body (superficial thrombophlebitis), it is usually not serious.However, when the condition happens in a vein that is deep inside the body (deep vein thrombosis, DVT), it can cause serious problems. What are the causes? This condition may be caused by:  Damage to a vein.  Inflammation of the veins.  A condition that  causes blood to clot more easily.  Reduced blood flow through the veins. What increases the risk? The following factors may make you more likely to develop this condition:  Having a condition that makes blood thicker or more likely to clot.  Having an infection.  Having major surgery.  Experiencing a traumatic injury or a broken bone.  Having a catheter in a vein (central line).  Having a condition in which valves in the veins do not work properly, causing blood to collect (pool) in the veins (chronic venous insufficiency).  An inactive (sedentary) lifestyle.  Pregnancy or having recently given birth.  Cancer.  Older age, especially being 53 or older.  Obesity.  Smoking.  Taking medicines that contain estrogen, such as birth control pills.  Having varicose veins.  Using drugs that are injected into the veins (intravenous, IV). What are the signs or symptoms? The main symptoms of this condition are:  Swelling and pain in an arm or leg. If the affected vein is in the leg, you may feel pain while standing or walking.  Warmth or redness in an arm or leg. Other symptoms include:  Low-grade fever.  Muscle aches.  A bulging vein (venous distension). In some cases, there are no symptoms. How is this diagnosed? This condition may be diagnosed based on:  Your symptoms and medical history.  A physical exam.  Tests, such as: ? Blood tests. ? A test that uses sound waves to make images (ultrasound). How is this treated? Treatment depends on how severe the condition is and which area of the body is affected. Treatment may include:  Applying a warm compress or heating pad to affected areas.  Wearing compression stockings to help prevent blood clots and reduce swelling in your legs.  Raising (elevating) the affected arm or leg above the level of your heart.  Medicines, such as: ? Anti-inflammatory medicines, such as ibuprofen. ? Blood thinners (anticoagulants),  such as heparin. ? Antibiotic medicine, if you have an infection.  Removing an IV that may be causing the problem. In rare cases, surgery may be needed to:  Remove a damaged section of a vein.  Place a filter in a large vein to catch blood clots before they reach the lungs. Follow these instructions at home: Medicines  Take over-the-counter and prescription medicines only as told by your health care provider.  If you were prescribed an antibiotic, take it as told by your health care provider. Do not stop using the antibiotic even if you feel better. Managing pain, stiffness, and swelling   If directed, put heat on the affected area as often as told by your health care provider. Use the heat source that your health care provider recommends, such as a moist heat pack or a heating pad. ? Place a towel between your skin and the heat source. ? Leave the heat on for 20-30 minutes. ? Remove the heat if your skin turns bright red. This is especially important if you are not able to feel pain,  heat, or cold. You may have a greater risk of getting burned.  Elevate the affected area above the level of your heart while you are sitting or lying down. Activity  Return to your normal activities as told by your health care provider. Ask your health care provider what activities are safe for you.  Avoid sitting or lying down for long periods. If possible, stand up and walk around regularly. If you are taking blood thinners:  Take your medicine exactly as told, at the same time every day.  Avoid activities that could cause injury or bruising, and follow instructions about how to prevent falls.  Wear a medical alert bracelet or carry a card that lists what medicines you take. General instructions  Drink enough fluid to keep your urine pale yellow.  Wear compression stockings as told by your health care provider.  Do not use any products that contain nicotine or tobacco, such as cigarettes and  e-cigarettes. If you need help quitting, ask your health care provider.  Keep all follow-up visits as told by your health care provider. This is important. Contact a health care provider if:  You miss a dose of your blood thinner, if applicable.  Your symptoms do not improve.  You have unusual bruising.  You have nausea, vomiting, or diarrhea that lasts for more than one day. Get help right away if:  You have any of these problems: ? New or worse pain, swelling, or redness in an arm or leg. ? Numbness or tingling in an arm or leg. ? Shortness of breath. ? Chest pain. ? Severe pain in your abdomen. ? Fast breathing. ? A fast or irregular heartbeat. ? Blood in your vomit, stool, or urine. ? A severe headache or confusion. ? A cut that does not stop bleeding.  You feel light-headed or dizzy.  You cough up blood.  You have a serious fall or accident, or you hit your head. These symptoms may represent a serious problem that is an emergency. Do not wait to see if the symptoms will go away. Get medical help right away. Call your local emergency services (911 in the [place] Do not drive yourself to the hospital. Summary  Thrombophlebitis is a condition in which a blood clot forms in a vein. This can happen in a vein close to the surface of the body or a vein deep inside the body.  This condition can cause serious problems when it happens in a vein deep inside the body (deep vein thrombosis, DVT).  The main symptom of this condition is swelling and pain around the affected vein.  Treatment may include warm compresses, anti-inflammatory medicines, or blood thinners. This information is not intended to replace advice given to you by your health care provider. Make sure you discuss any questions you have with your health care provider. Document Released: 07/02/2016 Document Revised: 07/02/2016 Document Reviewed: 07/02/2016 Elsevier Interactive Patient Education  [employer]2019 [employer]

## 2018-04-04 NOTE — Progress Notes (Signed)
VASCULAR LAB PRELIMINARY  PRELIMINARY  PRELIMINARY  PRELIMINARY  Right lower extremity venous duplex completed.    Preliminary report:  See CV proc for preliminary results  Jamiah Recore, RVT 04/04/2018, 10:34 AM

## 2018-04-04 NOTE — MAU Provider Note (Addendum)
Patient Jacqueline Clarke is a 33 y.o. [id number] S/p NSVD on 3-11 and BTL on 3-12. She was discharged home on 04-02-2018.  She is here with complaints headache, right lower leg pain and left lower extremity tingling, abdominal pain on her right lower quadrant and bleeding. She also complains of fatigue and headache. She denies constipation, NV. She is tearful.   History     CSN: [id number]  Arrival date and time: 04/04/18 1093   First Provider Initiated Contact with Patient 04/04/18 [phone]      Chief Complaint  Patient presents with  . Postpartum Complications  . Abdominal Pain  . Vaginal Bleeding   Abdominal Pain  This is a new problem. The current episode started today. The problem occurs constantly. The problem has been unchanged. The pain is located in the RLQ. The pain is at a severity of 10/10. The quality of the pain is cramping. The abdominal pain does not radiate. Associated symptoms include headaches. Pertinent negatives include no constipation, diarrhea, nausea or vomiting.  Vaginal Bleeding  Associated symptoms include abdominal pain and headaches. Pertinent negatives include no chills, constipation, diarrhea, nausea or vomiting.  The patient says that during the day yesterday (Saturday, 3-14), she passed 4 large blood clots and had to change her depends every 2 hours. She did not have any bleeding or blood clots over night. She  "slept all night" and then woke up feeling bad.   She also states that her right and left legs have felt numb and tingling off and on on Saturday. She found it difficult to walk and had to be helped out of bed.   OB History    Gravida  8   Para  6   Term  5   Preterm  1   AB  2   Living  7     SAB  2   TAB  0   Ectopic  0   Multiple  1   Live Births  7           Past Medical History:  Diagnosis Date  . Anemia   . Anxiety   . Asthma   . Bipolar disorder (HCC)   . Blood type, Rh negative   . Cholelithiasis   . Chronic  back pain    scoliosis- on percocet  . Depression   . Headache(784.0)   . History of cocaine use   . History of ileus 07/06/2016   After cesarean section  . Hyperemesis complicating pregnancy, antepartum 2013  . 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 Low vitamin D level 01/03/2016  . Obesity   . Pregnancy induced hypertension   . Pyelonephritis 07/03/2016   postpartum  . Scoliosis     Past Surgical History:  Procedure Laterality Date  . arm surgery    . CESAREAN SECTION N/A 06/23/2016   Procedure: CESAREAN SECTION;  Surgeon: [place] Bing, MD;  Location: [facility] BIRTHING SUITES;  Service: Obstetrics;  Laterality: N/A;  . CHOLECYSTECTOMY    . DILATION AND CURETTAGE OF UTERUS  2008  . MR WRIST RIGHT     sx to nerves  . NERVE, TENDON AND ARTERY REPAIR Right 06/18/2014   Procedure: NERVE, TENDON AND ARTERY REPAIR;  Surgeon: Bradly Bienenstock, MD;  Location: [facility] OR;  Service: Orthopedics;  Laterality: Right;  . TONSILLECTOMY AND ADENOIDECTOMY    . TUBAL LIGATION N/A 04/01/2018   Procedure: POST PARTUM TUBAL LIGATION;  Surgeon: Tereso Newcomer, MD;  Location: [facility] LD ORS;  Service: Gynecology;  Laterality: N/A;  . VAGINAL DELIVERY  06/23/2016   Procedure: VAGINAL DELIVERY;  Surgeon: [place] Bing, MD;  Location: [facility] BIRTHING SUITES;  Service: Obstetrics;;    Family History  Problem Relation Age of Onset  . Heart disease Maternal Grandfather   . Diabetes Maternal Grandfather   . Heart disease Father   . Anesthesia problems Neg Hx     Social History   Tobacco Use  . Smoking status: Former Smoker    Packs/day: 0.25    Types: Cigarettes    Last attempt to quit: 08/02/2017    Years since quitting: 0.6  . Smokeless tobacco: Never Used  Substance Use Topics  . Alcohol use: Not Currently    Alcohol/week: 0.0 standard drinks    Comment: Occassionally  . Drug use: Not Currently    Types: Cocaine    Comment: UDS + 11/22/17    Allergies: No Known Allergies  No medications prior  to admission.    Review of Systems  Constitutional: Negative.  Negative for activity change, appetite change, chills and diaphoresis.  HENT: Negative.   Respiratory: Negative.   Gastrointestinal: Positive for abdominal pain. Negative for constipation, diarrhea, nausea and vomiting.  Genitourinary: Positive for vaginal bleeding.  Neurological: Positive for headaches.  Psychiatric/Behavioral: Negative.    Physical Exam   Blood pressure 128/85, pulse 76, temperature 97.8 F (36.6 C), temperature source Oral, resp. rate 18, height  (1.803 m), weight (!) 144 kg, SpO2 96 %, unknown if currently breastfeeding.  Physical Exam  Constitutional: She is oriented to person, place, and time. She appears well-developed and well-nourished.  HENT:  Head: Normocephalic.  Neck: Normal range of motion.  Respiratory: Effort normal.  GI: Soft. She exhibits no distension and no mass. There is abdominal tenderness. There is no rebound and no guarding.  Genitourinary:    Genitourinary Comments: Uterus is firm and -3    Musculoskeletal: Normal range of motion.        General: Tenderness and edema present.  Neurological: She is alert and oriented to person, place, and time.  Skin: Skin is warm and dry.  RLE is hot and reddened with multiple varicosities. LLE also has multiple varicosities, no heat or redness.   Psychiatric: She has a normal mood and affect.   Results for orders placed or performed during the hospital encounter of 04/04/18 (from the past 24 hour(s))  CBC     Status: Abnormal   Collection Time: 04/04/18  6:12 AM  Result Value Ref Range   WBC 9.6 4.0 - 10.5 K/uL   RBC 3.65 (L) 3.87 - 5.11 MIL/uL   Hemoglobin 9.0 (L) 12.0 - 15.0 g/dL   HCT 16.1 (L) 09.6 - 04.5 %   MCV 82.2 80.0 - 100.0 fL   MCH 24.7 (L) 26.0 - 34.0 pg   MCHC 30.0 30.0 - 36.0 g/dL   RDW 40.9 (H) 81.1 - 91.4 %   Platelets 219 150 - 400 K/uL   nRBC 0.0 0.0 - 0.2 %  Comprehensive metabolic panel     Status:  Abnormal   Collection Time: 04/04/18  6:12 AM  Result Value Ref Range   Sodium 139 135 - 145 mmol/L   Potassium 3.9 3.5 - 5.1 mmol/L   Chloride 103 98 - 111 mmol/L   CO2 24 22 - 32 mmol/L   Glucose, Bld 95 70 - 99 mg/dL   BUN 8 6 - 20 mg/dL   Creatinine, Ser 7.82  0.44 - 1.00 mg/dL   Calcium 8.8 (L) 8.9 - 10.3 mg/dL   Total Protein 5.4 (L) 6.5 - 8.1 g/dL   Albumin 2.5 (L) 3.5 - 5.0 g/dL   AST 88 (H) 15 - 41 U/L   ALT 48 (H) 0 - 44 U/L   Alkaline Phosphatase 203 (H) 38 - 126 U/L   Total Bilirubin 0.5 0.3 - 1.2 mg/dL   GFR calc non Af Amer >60 >60 mL/min   GFR calc Af Amer >60 >60 mL/min   Anion gap 12 5 - 15   Ct Abdomen Pelvis W Contrast  Result Date: 04/04/2018 CLINICAL DATA:  Recent cesarean section with postpartum tubal ligation. Lower abdominal pain. EXAM: CT ABDOMEN AND PELVIS WITH CONTRAST TECHNIQUE: Multidetector CT imaging of the abdomen and pelvis was performed using the standard protocol following bolus administration of intravenous contrast. CONTRAST:  OMNIPAQUE IOHEXOL 300 MG/ML  SOLN COMPARISON:  12/17/2016 FINDINGS: Lower chest: Normal except for a small scar at the posterior right lung base. Hepatobiliary: Normal appearance of the liver parenchyma. Previous cholecystectomy. Pancreas: Normal Spleen: Scattered chronic low densities in the spleen, unchanged and quite likely benign. Adrenals/Urinary Tract: Normal adrenal glands. Normal kidneys. Normal bladder. Stomach/Bowel: No acute bowel finding. Vascular/Lymphatic: Normal Reproductive: Postpartum uterine enlargement as expected. No evidence of unexpected finding. Other: Nonspecific edema in the lower anterior abdominal wall consistent with recent cesarean section. There are a few air droplets just inferior to the umbilicus, not unexpected. One could not rule out an anterior abdominal wall infection but there does not appear to be any advanced finding. Musculoskeletal: Postpartum widening of the symphysis pubis as expected.  IMPRESSION: Recent cesarean section. Edema in the lower anterior abdominal wall consistent with that. Few droplets of air in the anterior subcutaneous fat just inferior to the umbilicus, felt consistent with the recent procedure. One could not rule out possibility of an anterior abdominal wall infection, but there is no advanced finding. Expected appearance of the postpartum uterus. No intraperitoneal complication evident. Electronically Signed   By: Paulina Fusi M.D.   On: 04/04/2018 10:03   Vas [place] Lower Extremity Venous (dvt)  Result Date: 04/04/2018  Lower Venous Study Indications: Pain, varicosities, and Postpartum.  Limitations: Body habitus. Comparison Study: Prior study from 03/30/17 is available for comparison Performing Technologist: Sherren Kerns RVS  Examination Guidelines: A complete evaluation includes B-mode imaging, spectral Doppler, color Doppler, and power Doppler as needed of all accessible portions of each vessel. Bilateral testing is considered an integral part of a complete examination. Limited examinations for reoccurring indications may be performed as noted.  Right Venous Findings: +---------+---------------+---------+-----------+----------+--------------+          CompressibilityPhasicitySpontaneityPropertiesSummary        +---------+---------------+---------+-----------+----------+--------------+ CFV                                                   Not visualized +---------+---------------+---------+-----------+----------+--------------+ SFJ                                                   Not visualized +---------+---------------+---------+-----------+----------+--------------+ FV Prox                 Yes  Yes                                 +---------+---------------+---------+-----------+----------+--------------+ FV Mid                  Yes      Yes                                  +---------+---------------+---------+-----------+----------+--------------+ FV Distal               Yes      Yes                                 +---------+---------------+---------+-----------+----------+--------------+ PFV                                                   Not visualized +---------+---------------+---------+-----------+----------+--------------+ POP                     Yes      Yes                                 +---------+---------------+---------+-----------+----------+--------------+ PTV                                                   Not visualized +---------+---------------+---------+-----------+----------+--------------+ PERO                                                  Not visualized +---------+---------------+---------+-----------+----------+--------------+ GSV      Full                                                        +---------+---------------+---------+-----------+----------+--------------+ Thrombophlebitis noted in multiple varicosities lateral calf  Right Technical Findings: Not visualized segments include Common femoral, SFJ, profunda,.  Left Technical Findings: Left leg not evaluated.   Summary: Right: There is no evidence of deep vein thrombosis in the lower extremity. Thrombophlebitis noted in varicosities in lateral calf  *See table(s) above for measurements and observations.    Preliminary    MAU Course  Procedures  MDM 714: still attempting IV access and pelvic/speculum exam deferred.  -CBC pending -20 mg of Lasix -60 mg of toradol for pain -CT to rule out possible hematoma, although unlikely. Pain most likely MSK.  -Doppler studies for rule out DVT vs. Superficial phlebitis.  Patient care endorsed to Donette Larry at 0830.  Charlesetta Garibaldi Kooistra  Pain and HA improved after meds. CT normal, labs normal except elevated LFTs. Duplex neg for DVT per tech, will order TEDs. BP normal but borderline will bring  back for BP check tomorrow. Discussed with  Dr. Despina Hidden. Stable for discharge home.  Assessment and Plan   1. Postpartum state   2. Thrombophlebitis   3. Acute non intractable tension-type headache   4. Postoperative abdominal pain    Discharge home Follow up at [facility] tomorrow for BP check- message sent Return precautions TED stockings  Allergies as of 04/04/2018   No Known Allergies     Medication List    TAKE these medications   cyclobenzaprine 5 MG tablet Commonly known as:  FLEXERIL Take 1 tablet (5 mg total) by mouth 3 (three) times daily as needed for muscle spasms.   ibuprofen 600 MG tablet Commonly known as:  ADVIL,MOTRIN Take 1 tablet (600 mg total) by mouth every 6 (six) hours.   oxyCODONE 5 MG immediate release tablet Commonly known as:  Oxy IR/ROXICODONE Take 1 tablet (5 mg total) by mouth every 4 (four) hours as needed for moderate pain or severe pain.   senna-docusate 8.6-50 MG tablet Commonly known as:  Senokot-S Take 2 tablets by mouth daily.   sertraline 50 MG tablet Commonly known as:  ZOLOFT Take 1 tablet (50 mg total) by mouth daily.   Tylenol 325 MG tablet Generic drug:  acetaminophen Take 650 mg by mouth every 6 (six) hours as needed.   Vitafol Ultra 29-0.6-0.4-200 MG Caps Take 1 capsule by mouth daily before breakfast.      Donette Larry, CNM  04/04/2018 12:01 PM

## 2018-04-05 ENCOUNTER — Other Ambulatory Visit: Payer: Self-pay

## 2018-04-05 ENCOUNTER — Ambulatory Visit (INDEPENDENT_AMBULATORY_CARE_PROVIDER_SITE_OTHER): Payer: Medicaid Other

## 2018-04-05 ENCOUNTER — Other Ambulatory Visit: Payer: Self-pay | Admitting: Obstetrics and Gynecology

## 2018-04-05 ENCOUNTER — Inpatient Hospital Stay (HOSPITAL_COMMUNITY)
Admission: AD | Admit: 2018-04-05 | Discharge: 2018-04-07 | DRG: 776 | Disposition: A | Discharge status: Home / Self Care | Payer: Medicaid Other | Attending: Obstetrics & Gynecology | Admitting: Obstetrics & Gynecology

## 2018-04-05 ENCOUNTER — Encounter (HOSPITAL_COMMUNITY): Payer: Self-pay | Admitting: *Deleted

## 2018-04-05 VITALS — BP 135/95

## 2018-04-05 DIAGNOSIS — O9089 Other complications of the puerperium, not elsewhere classified: Secondary | ICD-10-CM

## 2018-04-05 DIAGNOSIS — G44209 Tension-type headache, unspecified, not intractable: Secondary | ICD-10-CM | POA: Diagnosis not present

## 2018-04-05 DIAGNOSIS — Z0130 Encounter for examination of blood pressure without abnormal findings: Secondary | ICD-10-CM

## 2018-04-05 DIAGNOSIS — G8918 Other acute postprocedural pain: Secondary | ICD-10-CM | POA: Diagnosis not present

## 2018-04-05 DIAGNOSIS — R51 Headache: Secondary | ICD-10-CM

## 2018-04-05 DIAGNOSIS — O34219 Maternal care for unspecified type scar from previous cesarean delivery: Secondary | ICD-10-CM | POA: Diagnosis present

## 2018-04-05 DIAGNOSIS — O1415 Severe pre-eclampsia, complicating the puerperium: Secondary | ICD-10-CM | POA: Diagnosis present

## 2018-04-05 DIAGNOSIS — Z87891 Personal history of nicotine dependence: Secondary | ICD-10-CM | POA: Diagnosis not present

## 2018-04-05 DIAGNOSIS — R030 Elevated blood-pressure reading, without diagnosis of hypertension: Secondary | ICD-10-CM | POA: Diagnosis present

## 2018-04-05 DIAGNOSIS — L538 Other specified erythematous conditions: Secondary | ICD-10-CM | POA: Diagnosis not present

## 2018-04-05 DIAGNOSIS — O870 Superficial thrombophlebitis in the puerperium: Secondary | ICD-10-CM | POA: Diagnosis present

## 2018-04-05 DIAGNOSIS — Z302 Encounter for sterilization: Secondary | ICD-10-CM

## 2018-04-05 DIAGNOSIS — R109 Unspecified abdominal pain: Secondary | ICD-10-CM | POA: Diagnosis not present

## 2018-04-05 DIAGNOSIS — I809 Phlebitis and thrombophlebitis of unspecified site: Secondary | ICD-10-CM

## 2018-04-05 DIAGNOSIS — M79609 Pain in unspecified limb: Secondary | ICD-10-CM | POA: Diagnosis not present

## 2018-04-05 DIAGNOSIS — O165 Unspecified maternal hypertension, complicating the puerperium: Secondary | ICD-10-CM | POA: Diagnosis present

## 2018-04-05 LAB — URINALYSIS, ROUTINE W REFLEX MICROSCOPIC
BILIRUBIN URINE: NEGATIVE
Glucose, UA: NEGATIVE mg/dL
Ketones, ur: NEGATIVE mg/dL
NITRITE: NEGATIVE
Protein, ur: NEGATIVE mg/dL
Specific Gravity, Urine: 1.023 (ref 1.005–1.030)
pH: 7 (ref 5.0–8.0)

## 2018-04-05 LAB — COMPREHENSIVE METABOLIC PANEL
ALT: 42 U/L (ref 0–44)
ANION GAP: 7 (ref 5–15)
AST: 42 U/L — ABNORMAL HIGH (ref 15–41)
Albumin: 2.4 g/dL — ABNORMAL LOW (ref 3.5–5.0)
Alkaline Phosphatase: 197 U/L — ABNORMAL HIGH (ref 38–126)
BUN: 14 mg/dL (ref 6–20)
CO2: 24 mmol/L (ref 22–32)
Calcium: 8.5 mg/dL — ABNORMAL LOW (ref 8.9–10.3)
Chloride: 108 mmol/L (ref 98–111)
Creatinine, Ser: 0.91 mg/dL (ref 0.44–1.00)
GFR calc Af Amer: >60 mL/min (ref >60)
GFR calc non Af Amer: >60 mL/min (ref >60)
GLUCOSE: 95 mg/dL (ref 70–99)
Potassium: 3.7 mmol/L (ref 3.5–5.1)
Sodium: 139 mmol/L (ref 135–145)
Total Bilirubin: 0.4 mg/dL (ref 0.3–1.2)
Total Protein: 5.9 g/dL — ABNORMAL LOW (ref 6.5–8.1)

## 2018-04-05 LAB — CBC
HEMATOCRIT: 28.8 % — AB (ref 36.0–46.0)
Hemoglobin: 8.8 g/dL — ABNORMAL LOW (ref 12.0–15.0)
MCH: 25.1 pg — ABNORMAL LOW (ref 26.0–34.0)
MCHC: 30.6 g/dL (ref 30.0–36.0)
MCV: 82.1 fL (ref 80.0–100.0)
Platelets: 231 10*3/uL (ref 150–400)
RBC: 3.51 MIL/uL — ABNORMAL LOW (ref 3.87–5.11)
RDW: 15.9 % — AB (ref 11.5–15.5)
WBC: 8.1 10*3/uL (ref 4.0–10.5)
nRBC: 0 % (ref 0.0–0.2)

## 2018-04-05 LAB — PROTEIN / CREATININE RATIO, URINE
Creatinine, Urine: 197.16 mg/dL
PROTEIN CREATININE RATIO: 0.07 mg/mg{creat} (ref 0.00–0.15)
Total Protein, Urine: 13 mg/dL

## 2018-04-05 MED ORDER — LACTATED RINGERS IV SOLN
INTRAVENOUS | Status: DC
  Administered 2018-04-05 – 2018-04-06 (×3): via INTRAVENOUS

## 2018-04-05 MED ORDER — HYDRALAZINE HCL 20 MG/ML IJ SOLN: 10.0000 mg | INTRAMUSCULAR | Status: DC | PRN

## 2018-04-05 MED ORDER — IBUPROFEN 600 MG PO TABS
600.0000 mg | ORAL_TABLET | Freq: Four times a day (QID) | ORAL | Status: DC | PRN
  Administered 2018-04-06 – 2018-04-07 (×3): 600 mg via ORAL
  Filled 2018-04-05 (×3): qty 1

## 2018-04-05 MED ORDER — OXYCODONE HCL 5 MG PO TABS
5.0000 mg | ORAL_TABLET | ORAL | Status: DC | PRN
  Administered 2018-04-05 – 2018-04-06 (×3): 5 mg via ORAL
  Filled 2018-04-05 (×3): qty 1

## 2018-04-05 MED ORDER — AMLODIPINE BESYLATE 10 MG PO TABS
10.0000 mg | ORAL_TABLET | Freq: Every day | ORAL | Status: DC
  Administered 2018-04-05 – 2018-04-07 (×3): 10 mg via ORAL
  Filled 2018-04-05 (×3): qty 1

## 2018-04-05 MED ORDER — ENALAPRIL MALEATE 5 MG PO TABS: 5.0000 mg | ORAL_TABLET | Freq: Every day | ORAL | 0 refills | Status: DC

## 2018-04-05 MED ORDER — CALCIUM CARBONATE ANTACID 500 MG PO CHEW: 2.0000 | CHEWABLE_TABLET | ORAL | Status: DC | PRN

## 2018-04-05 MED ORDER — ZOLPIDEM TARTRATE 5 MG PO TABS: 5.0000 mg | ORAL_TABLET | Freq: Every evening | ORAL | Status: DC | PRN

## 2018-04-05 MED ORDER — LABETALOL HCL 5 MG/ML IV SOLN
20.0000 mg | INTRAVENOUS | Status: DC | PRN
  Administered 2018-04-05: 20 mg via INTRAVENOUS
  Filled 2018-04-05: qty 4

## 2018-04-05 MED ORDER — LABETALOL HCL 5 MG/ML IV SOLN
40.0000 mg | INTRAVENOUS | Status: DC | PRN
  Administered 2018-04-05: 40 mg via INTRAVENOUS
  Filled 2018-04-05: qty 8

## 2018-04-05 MED ORDER — ACETAMINOPHEN 325 MG PO TABS
650.0000 mg | ORAL_TABLET | ORAL | Status: DC | PRN
  Administered 2018-04-05 – 2018-04-06 (×2): 650 mg via ORAL
  Filled 2018-04-05 (×2): qty 2

## 2018-04-05 MED ORDER — MAGNESIUM SULFATE BOLUS VIA INFUSION
4.0000 g | Freq: Once | INTRAVENOUS | Status: AC
  Administered 2018-04-05: 4 g via INTRAVENOUS
  Filled 2018-04-05: qty 500

## 2018-04-05 MED ORDER — MAGNESIUM SULFATE 40 G IN LACTATED RINGERS - SIMPLE
2.0000 g/h | INTRAVENOUS | Status: AC
  Administered 2018-04-05 – 2018-04-06 (×2): 2 g/h via INTRAVENOUS
  Filled 2018-04-05: qty 1000
  Filled 2018-04-05: qty 500

## 2018-04-05 MED ORDER — BUTALBITAL-APAP-CAFFEINE 50-325-40 MG PO TABS
2.0000 | ORAL_TABLET | Freq: Once | ORAL | Status: AC
  Administered 2018-04-05: 2 via ORAL
  Filled 2018-04-05: qty 2

## 2018-04-05 MED ORDER — DOCUSATE SODIUM 100 MG PO CAPS
100.0000 mg | ORAL_CAPSULE | Freq: Every day | ORAL | Status: DC
  Administered 2018-04-06 – 2018-04-07 (×2): 100 mg via ORAL
  Filled 2018-04-05 (×2): qty 1

## 2018-04-05 MED ORDER — LABETALOL HCL 5 MG/ML IV SOLN: 80.0000 mg | INTRAVENOUS | Status: DC | PRN

## 2018-04-05 MED ORDER — PRENATAL MULTIVITAMIN CH
1.0000 | ORAL_TABLET | Freq: Every day | ORAL | Status: DC
  Administered 2018-04-06: 1 via ORAL
  Filled 2018-04-05: qty 1

## 2018-04-05 NOTE — Progress Notes (Signed)
Subjective:  Jacqueline Clarke is a 33 y.o. female here for BP check. Persistent headaches not resolving with medication.   Hypertension ROS: possible neurological symptoms headaches - no relief with medication, no chest pain on exertion, no dyspnea on exertion and noting swelling of ankles.    Objective:  Appearance alert, well appearing, and in no distress. General exam BP noted to be well controlled today in office.    Assessment:   Blood Pressure needs improvement.   Plan:  Pt to start Enalapril 5 mg sent to the pharmacy. Pt to report back to MAU for further observation per MD.

## 2018-04-05 NOTE — Addendum Note (Signed)
Addended by: Dalphine Handing on: 04/05/2018 04:19 PM   Modules accepted: Level of Service

## 2018-04-05 NOTE — MAU Note (Signed)
Sent from MD office for BP evaluation.  C/o H/A, visual disturbances, and epigastric pain.  Reports swelling in bilateral lower extremities. S/P NVD on 03/31/2018

## 2018-04-05 NOTE — H&P (Signed)
History   CSN: [id number]  Arrival date and time: 04/05/18 1729   First Provider Initiated Contact with Patient 04/05/18 1819         Chief Complaint  Patient presents with  . BP Evaluation   HPI   [name] is a 33 y.o. female [id number] status post vaginal delivery and BTL on 3/12.  Her pregnancy was complicated by [facility]. She is not on BP medications.  She presented yesterday to MAU with HA and and leg pain she was sent home and was told to f/u in the office today. Patient says her BP was elevated in the office today and she continues to have HA that is unrelieved with tylenol, ibuprofen or percocet; she was sent here for further evaluation.  She has a history of Headaches however this feels different.           OB History    Gravida  8   Para  6   Term  5   Preterm  1   AB  2   Living  7     SAB  2   TAB  0   Ectopic  0   Multiple  1   Live Births  7               Past Medical History:  Diagnosis Date  . Anemia   . Anxiety   . Asthma   . Bipolar disorder (HCC)   . Blood type, Rh negative   . Cholelithiasis   . Chronic back pain    scoliosis- on percocet  . Depression   . Headache(784.0)   . History of cocaine use   . History of ileus 07/06/2016   After cesarean section  . Hyperemesis complicating pregnancy, antepartum 2013  . 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 Low vitamin D level 01/03/2016  . Obesity   . Pregnancy induced hypertension   . Pyelonephritis 07/03/2016   postpartum  . Scoliosis          Past Surgical History:  Procedure Laterality Date  . arm surgery    . CESAREAN SECTION N/A 06/23/2016   Procedure: CESAREAN SECTION;  Surgeon: [place] Bing, MD;  Location: [facility] BIRTHING SUITES;  Service: Obstetrics;  Laterality: N/A;  . CHOLECYSTECTOMY    . DILATION AND CURETTAGE OF UTERUS  2008  . MR WRIST RIGHT     sx to nerves  . NERVE, TENDON AND ARTERY REPAIR Right  06/18/2014   Procedure: NERVE, TENDON AND ARTERY REPAIR;  Surgeon: Bradly Bienenstock, MD;  Location: [facility] OR;  Service: Orthopedics;  Laterality: Right;  . TONSILLECTOMY AND ADENOIDECTOMY    . TUBAL LIGATION N/A 04/01/2018   Procedure: POST PARTUM TUBAL LIGATION;  Surgeon: Tereso Newcomer, MD;  Location: [facility] LD ORS;  Service: Gynecology;  Laterality: N/A;  . VAGINAL DELIVERY  06/23/2016   Procedure: VAGINAL DELIVERY;  Surgeon: [place] Bing, MD;  Location: [facility] BIRTHING SUITES;  Service: Obstetrics;;         Family History  Problem Relation Age of Onset  . Heart disease Maternal Grandfather   . Diabetes Maternal Grandfather   . Heart disease Father   . Anesthesia problems Neg Hx     Social History        Tobacco Use  . Smoking status: Former Smoker    Packs/day: 0.25    Types: Cigarettes    Last attempt to quit: 08/02/2017    Years since quitting:  0.6  . Smokeless tobacco: Never Used  Substance Use Topics  . Alcohol use: Not Currently    Alcohol/week: 0.0 standard drinks    Comment: Occassionally  . Drug use: Not Currently    Types: Cocaine    Comment: UDS + 11/22/17    Allergies: No Known Allergies         Medications Prior to Admission  Medication Sig Dispense Refill Last Dose  . acetaminophen (TYLENOL) 325 MG tablet Take 650 mg by mouth every 6 (six) hours as needed.   Not Taking  . cyclobenzaprine (FLEXERIL) 5 MG tablet Take 1 tablet (5 mg total) by mouth 3 (three) times daily as needed for muscle spasms. (Patient not taking: Reported on 04/05/2018) 20 tablet 0 Not Taking  . enalapril (VASOTEC) 5 MG tablet Take 1 tablet (5 mg total) by mouth daily. (Patient not taking: Reported on 04/05/2018) 30 tablet 0 Not Taking  . ibuprofen (ADVIL,MOTRIN) 600 MG tablet Take 1 tablet (600 mg total) by mouth every 6 (six) hours. 60 tablet 1 Taking  . oxyCODONE (OXY IR/ROXICODONE) 5 MG immediate release tablet Take 1 tablet (5 mg total) by mouth every 4 (four)  hours as needed for moderate pain or severe pain. 30 tablet 0 Taking  . Prenat-Fe Poly-Methfol-FA-DHA (VITAFOL ULTRA) 29-0.6-0.4-200 MG CAPS Take 1 capsule by mouth daily before breakfast. 90 capsule 4 Taking  . senna-docusate (SENOKOT-S) 8.6-50 MG tablet Take 2 tablets by mouth daily. 30 tablet 0 Taking  . sertraline (ZOLOFT) 50 MG tablet Take 1 tablet (50 mg total) by mouth daily. 30 tablet 0 Taking   LabResultsLast48Hours        Results for orders placed or performed during the hospital encounter of 04/05/18 (from the past 48 hour(s))  Urinalysis, Routine w reflex microscopic     Status: Abnormal   Collection Time: 04/05/18  6:11 PM  Result Value Ref Range   Color, Urine YELLOW YELLOW   APPearance CLEAR CLEAR   Specific Gravity, Urine 1.023 1.005 - 1.030   pH 7.0 5.0 - 8.0   Glucose, UA NEGATIVE NEGATIVE mg/dL   Hgb urine dipstick LARGE (A) NEGATIVE   Bilirubin Urine NEGATIVE NEGATIVE   Ketones, ur NEGATIVE NEGATIVE mg/dL   Protein, ur NEGATIVE NEGATIVE mg/dL   Nitrite NEGATIVE NEGATIVE   Leukocytes,Ua MODERATE (A) NEGATIVE   RBC / HPF 21-50 0 - 5 RBC/hpf   WBC, UA 21-50 0 - 5 WBC/hpf   Bacteria, UA RARE (A) NONE SEEN   Squamous Epithelial / LPF 0-5 0 - 5   Mucus PRESENT     Comment: Performed at [facility], [address], [place], [place] [address]  Protein / creatinine ratio, urine     Status: None   Collection Time: 04/05/18  6:50 PM  Result Value Ref Range   Creatinine, Urine 197.16 mg/dL   Total Protein, Urine 13 mg/dL    Comment: NO NORMAL RANGE ESTABLISHED FOR THIS TEST   Protein Creatinine Ratio 0.07 0.00 - 0.15 mg/mg[Cre]    Comment: Performed at [facility], [address], [place], [place] [address]  CBC     Status: Abnormal   Collection Time: 04/05/18  6:51 PM  Result Value Ref Range   WBC 8.1 4.0 - 10.5 K/uL   RBC 3.51 (L) 3.87 - 5.11 MIL/uL   Hemoglobin 8.8 (L) 12.0 - 15.0 g/dL   HCT 83.8 (L) 18.4 - 03.7  %   MCV 82.1 80.0 - 100.0 fL  MCH 25.1 (L) 26.0 - 34.0 pg   MCHC 30.6 30.0 - 36.0 g/dL   RDW 16.115.9 (H) 09.611.5 - 04.515.5 %   Platelets 231 150 - 400 K/uL   nRBC 0.0 0.0 - 0.2 %    Comment: Performed at [facility], [address] [place], [place] [address]  Comprehensive metabolic panel     Status: Abnormal   Collection Time: 04/05/18  6:51 PM  Result Value Ref Range   Sodium 139 135 - 145 mmol/L   Potassium 3.7 3.5 - 5.1 mmol/L   Chloride 108 98 - 111 mmol/L   CO2 24 22 - 32 mmol/L   Glucose, Bld 95 70 - 99 mg/dL   BUN 14 6 - 20 mg/dL   Creatinine, Ser 1.910.91 0.44 - 1.00 mg/dL   Calcium 8.5 (L) 8.9 - 10.3 mg/dL   Total Protein 5.9 (L) 6.5 - 8.1 g/dL   Albumin 2.4 (L) 3.5 - 5.0 g/dL   AST 42 (H) 15 - 41 U/L   ALT 42 0 - 44 U/L   Alkaline Phosphatase 197 (H) 38 - 126 U/L   Total Bilirubin 0.4 0.3 - 1.2 mg/dL   GFR calc non Af Amer >60 >60 mL/min   GFR calc Af Amer >60 >60 mL/min   Anion gap 7 5 - 15    Comment: Performed at [facility], [address] [place], [place] [address]     Review of Systems  Eyes: Positive for visual disturbance.  Cardiovascular: Negative for chest pain.  Gastrointestinal: Negative for abdominal pain.  Neurological: Positive for headaches.   Physical Exam   Blood pressure (!) 150/86, pulse 83, temperature 98.8 F (37.1 C), temperature source Oral, resp. rate 20, height 5\' 11"  (1.803 m), weight (!) 143.3 kg, SpO2 97 %, unknown if currently breastfeeding.  Patient Vitals for the past 24 hrs:  BP Temp Temp src Pulse Resp SpO2 Height Weight  04/05/18 2031 (!) 150/86 - - 83 - - - -  04/05/18 2015 (!) 150/101 - - 91 - - - -  04/05/18 2000 (!) 143/84 - - 83 - - - -  04/05/18 1946 138/79 - - 84 - - - -  04/05/18 1931 140/79 - - 78 - - - -  04/05/18 1915 (!) 143/85 - - 86 - - - -  04/05/18 1900 136/90 - - 85 - - - -  04/05/18 1853 134/81 - - 88 - - - -  04/05/18 1831 137/87 - - 84 - - - -  04/05/18 1813  135/83 - - 89 - - - -  04/05/18 1756 140/90 98.8 F (37.1 C) Oral 96 20 97 % 5\' 11"  (1.803 m) (!) 143.3 kg   Physical Exam  Constitutional: She is oriented to person, place, and time. She appears well-developed and well-nourished. No distress.  HENT:  Head: Normocephalic.  Eyes: Pupils are equal, round, and reactive to light.  Neck: Neck supple.  Respiratory: Effort normal.  Neurological: She is alert and oriented to person, place, and time. She displays normal reflexes.  Negative clonus   Skin: Skin is warm. She is not diaphoretic.  Psychiatric: Her behavior is normal.   MAU Course  Procedures  None  MDM  Fioricet 2 tabs given, no relief of HA. Pain 9/10 CBC, CMP, PCR done and stable  Discussed patient with Dr. Debroah LoopArnold, will admit for HA and elevated BP readings.  Urine culture ordered   Assessment and Plan   A:  1. Hypertension in pregnancy, pre-eclampsia, severe, delivered/postpartum   2. Headache in pregnancy, postpartum     P:  Admit to the hospital Magnesium IV ordered Norvasc 10 mg PO daily   Rasch, Harolyn Rutherford, NP 04/05/2018 8:52 PM Copied form MAU note  Adam Phenix, MD 04/05/2018

## 2018-04-05 NOTE — MAU Provider Note (Signed)
History     CSN: [id number]  Arrival date and time: 04/05/18 1729   First Provider Initiated Contact with Patient 04/05/18 1819      Chief Complaint  Patient presents with  . BP Evaluation   HPI   [name] is a 33 y.o. female [id number] status post vaginal delivery and BTL on 3/12.  Her pregnancy was complicated by [facility]. She is not on BP medications.  She presented yesterday to MAU with HA and and leg pain she was sent home and was told to f/u in the office today. Patient says her BP was elevated in the office today and she continues to have HA that is unrelieved with tylenol, ibuprofen or percocet; she was sent here for further evaluation.  She has a history of Headaches however this feels different.   OB History    Gravida  8   Para  6   Term  5   Preterm  1   AB  2   Living  7     SAB  2   TAB  0   Ectopic  0   Multiple  1   Live Births  7           Past Medical History:  Diagnosis Date  . Anemia   . Anxiety   . Asthma   . Bipolar disorder (HCC)   . Blood type, Rh negative   . Cholelithiasis   . Chronic back pain    scoliosis- on percocet  . Depression   . Headache(784.0)   . History of cocaine use   . History of ileus 07/06/2016   After cesarean section  . Hyperemesis complicating pregnancy, antepartum 2013  . 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 Low vitamin D level 01/03/2016  . Obesity   . Pregnancy induced hypertension   . Pyelonephritis 07/03/2016   postpartum  . Scoliosis     Past Surgical History:  Procedure Laterality Date  . arm surgery    . CESAREAN SECTION N/A 06/23/2016   Procedure: CESAREAN SECTION;  Surgeon: [place] Bing, MD;  Location: [facility] BIRTHING SUITES;  Service: Obstetrics;  Laterality: N/A;  . CHOLECYSTECTOMY    . DILATION AND CURETTAGE OF UTERUS  2008  . MR WRIST RIGHT     sx to nerves  . NERVE, TENDON AND ARTERY REPAIR Right 06/18/2014   Procedure: NERVE, TENDON AND ARTERY REPAIR;   Surgeon: Bradly Bienenstock, MD;  Location: [facility] OR;  Service: Orthopedics;  Laterality: Right;  . TONSILLECTOMY AND ADENOIDECTOMY    . TUBAL LIGATION N/A 04/01/2018   Procedure: POST PARTUM TUBAL LIGATION;  Surgeon: Tereso Newcomer, MD;  Location: [facility] LD ORS;  Service: Gynecology;  Laterality: N/A;  . VAGINAL DELIVERY  06/23/2016   Procedure: VAGINAL DELIVERY;  Surgeon: [place] Bing, MD;  Location: [facility] BIRTHING SUITES;  Service: Obstetrics;;    Family History  Problem Relation Age of Onset  . Heart disease Maternal Grandfather   . Diabetes Maternal Grandfather   . Heart disease Father   . Anesthesia problems Neg Hx     Social History   Tobacco Use  . Smoking status: Former Smoker    Packs/day: 0.25    Types: Cigarettes    Last attempt to quit: 08/02/2017    Years since quitting: 0.6  . Smokeless tobacco: Never Used  Substance Use Topics  . Alcohol use: Not Currently    Alcohol/week: 0.0 standard drinks    Comment:  Occassionally  . Drug use: Not Currently    Types: Cocaine    Comment: UDS + 11/22/17    Allergies: No Known Allergies  Medications Prior to Admission  Medication Sig Dispense Refill Last Dose  . acetaminophen (TYLENOL) 325 MG tablet Take 650 mg by mouth every 6 (six) hours as needed.   Not Taking  . cyclobenzaprine (FLEXERIL) 5 MG tablet Take 1 tablet (5 mg total) by mouth 3 (three) times daily as needed for muscle spasms. (Patient not taking: Reported on 04/05/2018) 20 tablet 0 Not Taking  . enalapril (VASOTEC) 5 MG tablet Take 1 tablet (5 mg total) by mouth daily. (Patient not taking: Reported on 04/05/2018) 30 tablet 0 Not Taking  . ibuprofen (ADVIL,MOTRIN) 600 MG tablet Take 1 tablet (600 mg total) by mouth every 6 (six) hours. 60 tablet 1 Taking  . oxyCODONE (OXY IR/ROXICODONE) 5 MG immediate release tablet Take 1 tablet (5 mg total) by mouth every 4 (four) hours as needed for moderate pain or severe pain. 30 tablet 0 Taking  . Prenat-Fe Poly-Methfol-FA-DHA (VITAFOL  ULTRA) 29-0.6-0.4-200 MG CAPS Take 1 capsule by mouth daily before breakfast. 90 capsule 4 Taking  . senna-docusate (SENOKOT-S) 8.6-50 MG tablet Take 2 tablets by mouth daily. 30 tablet 0 Taking  . sertraline (ZOLOFT) 50 MG tablet Take 1 tablet (50 mg total) by mouth daily. 30 tablet 0 Taking   Results for orders placed or performed during the hospital encounter of 04/05/18 (from the past 48 hour(s))  Urinalysis, Routine w reflex microscopic     Status: Abnormal   Collection Time: 04/05/18  6:11 PM  Result Value Ref Range   Color, Urine YELLOW YELLOW   APPearance CLEAR CLEAR   Specific Gravity, Urine 1.023 1.005 - 1.030   pH 7.0 5.0 - 8.0   Glucose, UA NEGATIVE NEGATIVE mg/dL   Hgb urine dipstick LARGE (A) NEGATIVE   Bilirubin Urine NEGATIVE NEGATIVE   Ketones, ur NEGATIVE NEGATIVE mg/dL   Protein, ur NEGATIVE NEGATIVE mg/dL   Nitrite NEGATIVE NEGATIVE   Leukocytes,Ua MODERATE (A) NEGATIVE   RBC / HPF 21-50 0 - 5 RBC/hpf   WBC, UA 21-50 0 - 5 WBC/hpf   Bacteria, UA RARE (A) NONE SEEN   Squamous Epithelial / LPF 0-5 0 - 5   Mucus PRESENT     Comment: Performed at [facility], [address] [place], [place] [address]  Protein / creatinine ratio, urine     Status: None   Collection Time: 04/05/18  6:50 PM  Result Value Ref Range   Creatinine, Urine 197.16 mg/dL   Total Protein, Urine 13 mg/dL    Comment: NO NORMAL RANGE ESTABLISHED FOR THIS TEST   Protein Creatinine Ratio 0.07 0.00 - 0.15 mg/mg[Cre]    Comment: Performed at [facility], [address], [place], [place] [address]  CBC     Status: Abnormal   Collection Time: 04/05/18  6:51 PM  Result Value Ref Range   WBC 8.1 4.0 - 10.5 K/uL   RBC 3.51 (L) 3.87 - 5.11 MIL/uL   Hemoglobin 8.8 (L) 12.0 - 15.0 g/dL   HCT 42.5 (L) 95.6 - 38.7 %   MCV 82.1 80.0 - 100.0 fL   MCH 25.1 (L) 26.0 - 34.0 pg   MCHC 30.6 30.0 - 36.0 g/dL   RDW 56.4 (H) 33.2 - 95.1 %   Platelets 231 150 - 400 K/uL   nRBC 0.0 0.0 - 0.2 %  Comment: Performed at [facility], [address], [place], [place] [address]  Comprehensive metabolic panel     Status: Abnormal   Collection Time: 04/05/18  6:51 PM  Result Value Ref Range   Sodium 139 135 - 145 mmol/L   Potassium 3.7 3.5 - 5.1 mmol/L   Chloride 108 98 - 111 mmol/L   CO2 24 22 - 32 mmol/L   Glucose, Bld 95 70 - 99 mg/dL   BUN 14 6 - 20 mg/dL   Creatinine, Ser 9.39 0.44 - 1.00 mg/dL   Calcium 8.5 (L) 8.9 - 10.3 mg/dL   Total Protein 5.9 (L) 6.5 - 8.1 g/dL   Albumin 2.4 (L) 3.5 - 5.0 g/dL   AST 42 (H) 15 - 41 U/L   ALT 42 0 - 44 U/L   Alkaline Phosphatase 197 (H) 38 - 126 U/L   Total Bilirubin 0.4 0.3 - 1.2 mg/dL   GFR calc non Af Amer >60 >60 mL/min   GFR calc Af Amer >60 >60 mL/min   Anion gap 7 5 - 15    Comment: Performed at [facility], [address] [place], [place] [address]   Review of Systems  Eyes: Positive for visual disturbance.  Cardiovascular: Negative for chest pain.  Gastrointestinal: Negative for abdominal pain.  Neurological: Positive for headaches.   Physical Exam   Blood pressure (!) 150/86, pulse 83, temperature 98.8 F (37.1 C), temperature source Oral, resp. rate 20, height 5\' 11"  (1.803 m), weight (!) 143.3 kg, SpO2 97 %, unknown if currently breastfeeding.  Patient Vitals for the past 24 hrs:  BP Temp Temp src Pulse Resp SpO2 Height Weight  04/05/18 2031 (!) 150/86 - - 83 - - - -  04/05/18 2015 (!) 150/101 - - 91 - - - -  04/05/18 2000 (!) 143/84 - - 83 - - - -  04/05/18 1946 138/79 - - 84 - - - -  04/05/18 1931 140/79 - - 78 - - - -  04/05/18 1915 (!) 143/85 - - 86 - - - -  04/05/18 1900 136/90 - - 85 - - - -  04/05/18 1853 134/81 - - 88 - - - -  04/05/18 1831 137/87 - - 84 - - - -  04/05/18 1813 135/83 - - 89 - - - -  04/05/18 1756 140/90 98.8 F (37.1 C) Oral 96 20 97 % 5\' 11"  (1.803 m) (!) 143.3 kg   Physical Exam  Constitutional: She is oriented to person, place, and time. She appears well-developed and  well-nourished. No distress.  HENT:  Head: Normocephalic.  Eyes: Pupils are equal, round, and reactive to light.  Neck: Neck supple.  Respiratory: Effort normal.  Neurological: She is alert and oriented to person, place, and time. She displays normal reflexes.  Negative clonus   Skin: Skin is warm. She is not diaphoretic.  Psychiatric: Her behavior is normal.   MAU Course  Procedures  None  MDM  Fioricet 2 tabs given, no relief of HA. Pain 9/10 CBC, CMP, PCR done and stable  Discussed patient with Dr. Debroah Loop, will admit for HA and elevated BP readings.  Urine culture ordered   Assessment and Plan   A:  1. Hypertension in pregnancy, pre-eclampsia, severe, delivered/postpartum   2. Headache in pregnancy, postpartum     P:  Admit to the hospital Magnesium IV ordered Norvasc 10 mg PO daily   Anjulie Dipierro, Harolyn Rutherford, NP 04/05/2018 8:52 PM

## 2018-04-06 ENCOUNTER — Encounter: Payer: Medicaid Other | Admitting: Obstetrics

## 2018-04-06 MED ORDER — SERTRALINE HCL 50 MG PO TABS
50.0000 mg | ORAL_TABLET | Freq: Every day | ORAL | Status: DC
  Administered 2018-04-06 – 2018-04-07 (×2): 50 mg via ORAL
  Filled 2018-04-06 (×2): qty 1

## 2018-04-06 MED ORDER — OXYCODONE HCL 5 MG PO TABS
10.0000 mg | ORAL_TABLET | Freq: Four times a day (QID) | ORAL | Status: DC | PRN
  Administered 2018-04-06 – 2018-04-07 (×3): 10 mg via ORAL
  Filled 2018-04-06 (×3): qty 2

## 2018-04-06 NOTE — Lactation Note (Signed)
Lactation Consultation Note  Patient Name: LANIS MCWHITE [id number] Date: 04/06/2018    Mom readmitted.  Infant 103 days old.  Mom has 7 children but this is her first to breastfeed.  Mom states she tried to breastfeed her previous children 3 days to 1 wk. Of age but without success.  Mom says this her newborn was latching very well.  Mom just finished pumping when LC entered room.  She is starting to feel full spots on the sides of both breasts.  LC assessed and breasts are softened with a small area of fullness on the outsides on right and left breast.    Mom requests a different size flange; she was given a 27 but has used the 24.  LC provided these for mom.  Discussed pumping 8 X in 24 hours in order to maintain and continue to establish milk supply and production.   LC provided another basin for cleaning/drying pump parts and bottles to store her milk in.    Questions answered.  LC encouraged mom to call out for assistance with next pump if needed.  Engorgement prevention discussed.  Mom has lactation brochure with phone number to leave a voicemail or to set up OP appointment after DC.  Mom has a DEBP at home.  Infant is at home with dad.    Maternal Data    Feeding    LATCH Score                   Interventions    Lactation Tools Discussed/Used     Consult Status      Maryruth Hancock [facility] 04/06/2018, 9:30 PM

## 2018-04-06 NOTE — Progress Notes (Signed)
CSW attempted to meet with MOB but she is currently on Mag.  CSW will meet with MOB when Mag is discontinued.  Blaine Hamper, MSW, LCSW Clinical Social Work [phone]

## 2018-04-06 NOTE — Progress Notes (Signed)
Faculty Attending Note  Subjective: Patient is feeling better, headache improved. Overall, she reports she is generally feeling much better. She reports moderately well controlled pain on PO pain meds. She is ambulating and denies light-headedness or dizziness. She is tolerating a regular diet without nausea/vomiting. Bleeding is moderate.   Objective: Blood pressure 125/83, pulse 86, temperature 98.1 F (36.7 C), temperature source Oral, resp. rate 17, height 5\' 11"  (1.803 m), weight (!) 143.3 kg, SpO2 99 %, unknown if currently breastfeeding. Temp:  [98.1 F (36.7 C)-98.8 F (37.1 C)] 98.1 F (36.7 C) (03/17 1157) Pulse Rate:  [74-96] 86 (03/17 1157) Resp:  [16-20] 17 (03/17 1157) BP: (112-172)/(70-106) 125/83 (03/17 1157) SpO2:  [97 %-100 %] 99 % (03/17 1157) Weight:  [143.3 kg] 143.3 kg (03/16 1756)  Physical Exam:  General: alert, oriented, cooperative Chest: CTAB, normal respiratory effort Heart: RRR  Abdomen: soft, appropriately tender to palpation, umbilical incision covered by bandage, appears clean/dry/intact  Uterine Fundus: unable to palpate DVT Evaluation: no evidence of DVT Extremities: +1 edema bilaterally, no calf tenderness   Current Facility-Administered Medications:  .  acetaminophen (TYLENOL) tablet 650 mg, 650 mg, Oral, Q4H PRN, Rasch, Jennifer I, NP, 650 mg at 04/06/18 1003 .  amLODipine (NORVASC) tablet 10 mg, 10 mg, Oral, Daily, Rasch, Jennifer I, NP, 10 mg at 04/06/18 1003 .  calcium carbonate (TUMS - dosed in mg elemental calcium) chewable tablet 400 mg of elemental calcium, 2 tablet, Oral, Q4H PRN, Rasch, Victorino Dike I, NP .  docusate sodium (COLACE) capsule 100 mg, 100 mg, Oral, Daily, Rasch, Jennifer I, NP, 100 mg at 04/06/18 1003 .  labetalol (NORMODYNE,TRANDATE) injection 20 mg, 20 mg, Intravenous, PRN, 20 mg at 04/05/18 2209 **AND** labetalol (NORMODYNE,TRANDATE) injection 40 mg, 40 mg, Intravenous, PRN, 40 mg at 04/05/18 2234 **AND** labetalol  (NORMODYNE,TRANDATE) injection 80 mg, 80 mg, Intravenous, PRN **AND** hydrALAZINE (APRESOLINE) injection 10 mg, 10 mg, Intravenous, PRN **AND** [COMPLETED] Measure blood pressure, , , Once, Adam Phenix, MD .  ibuprofen (ADVIL,MOTRIN) tablet 600 mg, 600 mg, Oral, Q6H PRN, Adam Phenix, MD, 600 mg at 04/06/18 0807 .  lactated ringers infusion, , Intravenous, Continuous, Adam Phenix, MD, Last Rate: 100 mL/hr at 04/06/18 1100 .  magnesium sulfate 40 grams in LR 500 mL OB infusion, 2 g/hr, Intravenous, Titrated, Rasch, Jennifer I, NP, Last Rate: 25 mL/hr at 04/06/18 1100, 2 g/hr at 04/06/18 1100 .  oxyCODONE (Oxy IR/ROXICODONE) immediate release tablet 10 mg, 10 mg, Oral, Q6H PRN, Conan Bowens, MD .  prenatal multivitamin tablet 1 tablet, 1 tablet, Oral, Q1200, Rasch, Victorino Dike I, NP, 1 tablet at 04/06/18 1157 .  sertraline (ZOLOFT) tablet 50 mg, 50 mg, Oral, Daily, Conan Bowens, MD .  zolpidem [facility]) tablet 5 mg, 5 mg, Oral, QHS PRN, Venia Carbon I, NP Recent Labs    04/04/18 0612 04/05/18 1851  HGB 9.0* 8.8*  HCT 30.0* 28.8*    Assessment/Plan:  Patient is 33 y.o. [id number] PPD#6 s/p SVD, POD#5 s/p PPTL who is admitted for post partum preeclampsia and headache. Started on MgSO4 last pm with improvement of HTN. On norvasc 10 mg, will continue this. Overall, she is doing better.    Continue routine post partum care Pain meds prn Regular diet norvasc 10 mg MgSO4 until 2130 tonight S/p BTL for birth control Plan for discharge likely tomorrow   K. Therese Sarah, M.D. Attending [facility] [employer] (Faculty Practice)  04/06/2018, 12:55 PM

## 2018-04-07 ENCOUNTER — Inpatient Hospital Stay (HOSPITAL_COMMUNITY): Payer: Medicaid Other

## 2018-04-07 DIAGNOSIS — I809 Phlebitis and thrombophlebitis of unspecified site: Secondary | ICD-10-CM

## 2018-04-07 DIAGNOSIS — M79609 Pain in unspecified limb: Secondary | ICD-10-CM

## 2018-04-07 DIAGNOSIS — L538 Other specified erythematous conditions: Secondary | ICD-10-CM

## 2018-04-07 LAB — CULTURE, OB URINE: Special Requests: NORMAL

## 2018-04-07 MED ORDER — OXYCODONE HCL 5 MG PO TABS: 5.0000 mg | ORAL_TABLET | Freq: Four times a day (QID) | ORAL | 0 refills | Status: DC | PRN

## 2018-04-07 MED ORDER — AMLODIPINE BESYLATE 10 MG PO TABS: 10.0000 mg | ORAL_TABLET | Freq: Every day | ORAL | 2 refills | Status: DC

## 2018-04-07 NOTE — Discharge Instructions (Signed)
Thrombophlebitis Thrombophlebitis is a condition in which a blood clot forms in a vein. This can happen in your arms or legs, or in the area between your neck and groin (torso). When this condition happens in a vein that is close to the surface of the body (superficial thrombophlebitis), it is usually not serious.However, when the condition happens in a vein that is deep inside the body (deep vein thrombosis, DVT), it can cause serious problems. What are the causes? This condition may be caused by:  Damage to a vein.  Inflammation of the veins.  A condition that causes blood to clot more easily.  Reduced blood flow through the veins. What increases the risk? The following factors may make you more likely to develop this condition:  Having a condition that makes blood thicker or more likely to clot.  Having an infection.  Having major surgery.  Experiencing a traumatic injury or a broken bone.  Having a catheter in a vein (central line).  Having a condition in which valves in the veins do not work properly, causing blood to collect (pool) in the veins (chronic venous insufficiency).  An inactive (sedentary) lifestyle.  Pregnancy or having recently given birth.  Cancer.  Older age, especially being 94 or older.  Obesity.  Smoking.  Taking medicines that contain estrogen, such as birth control pills.  Having varicose veins.  Using drugs that are injected into the veins (intravenous, IV). What are the signs or symptoms? The main symptoms of this condition are:  Swelling and pain in an arm or leg. If the affected vein is in the leg, you may feel pain while standing or walking.  Warmth or redness in an arm or leg. Other symptoms include:  Low-grade fever.  Muscle aches.  A bulging vein (venous distension). In some cases, there are no symptoms. How is this diagnosed? This condition may be diagnosed based on:  Your symptoms and medical history.  A physical  exam.  Tests, such as: ? Blood tests. ? A test that uses sound waves to make images (ultrasound). How is this treated? Treatment depends on how severe the condition is and which area of the body is affected. Treatment may include:  Applying a warm compress or heating pad to affected areas.  Wearing compression stockings to help prevent blood clots and reduce swelling in your legs.  Raising (elevating) the affected arm or leg above the level of your heart.  Medicines, such as: ? Anti-inflammatory medicines, such as ibuprofen. ? Blood thinners (anticoagulants), such as heparin. ? Antibiotic medicine, if you have an infection.  Removing an IV that may be causing the problem. In rare cases, surgery may be needed to:  Remove a damaged section of a vein.  Place a filter in a large vein to catch blood clots before they reach the lungs. Follow these instructions at home: Medicines  Take over-the-counter and prescription medicines only as told by your health care provider.  If you were prescribed an antibiotic, take it as told by your health care provider. Do not stop using the antibiotic even if you feel better. Managing pain, stiffness, and swelling   If directed, put heat on the affected area as often as told by your health care provider. Use the heat source that your health care provider recommends, such as a moist heat pack or a heating pad. ? Place a towel between your skin and the heat source. ? Leave the heat on for 20-30 minutes. ? Remove the heat  if your skin turns bright red. This is especially important if you are not able to feel pain, heat, or cold. You may have a greater risk of getting burned.  Elevate the affected area above the level of your heart while you are sitting or lying down. Activity  Return to your normal activities as told by your health care provider. Ask your health care provider what activities are safe for you.  Avoid sitting or lying down for long  periods. If possible, stand up and walk around regularly. If you are taking blood thinners:  Take your medicine exactly as told, at the same time every day.  Avoid activities that could cause injury or bruising, and follow instructions about how to prevent falls.  Wear a medical alert bracelet or carry a card that lists what medicines you take. General instructions  Drink enough fluid to keep your urine pale yellow.  Wear compression stockings as told by your health care provider.  Do not use any products that contain nicotine or tobacco, such as cigarettes and e-cigarettes. If you need help quitting, ask your health care provider.  Keep all follow-up visits as told by your health care provider. This is important. Contact a health care provider if:  You miss a dose of your blood thinner, if applicable.  Your symptoms do not improve.  You have unusual bruising.  You have nausea, vomiting, or diarrhea that lasts for more than one day. Get help right away if:  You have any of these problems: ? New or worse pain, swelling, or redness in an arm or leg. ? Numbness or tingling in an arm or leg. ? Shortness of breath. ? Chest pain. ? Severe pain in your abdomen. ? Fast breathing. ? A fast or irregular heartbeat. ? Blood in your vomit, stool, or urine. ? A severe headache or confusion. ? A cut that does not stop bleeding.  You feel light-headed or dizzy.  You cough up blood.  You have a serious fall or accident, or you hit your head. These symptoms may represent a serious problem that is an emergency. Do not wait to see if the symptoms will go away. Get medical help right away. Call your local emergency services (911 in the [place] Do not drive yourself to the hospital. Summary  Thrombophlebitis is a condition in which a blood clot forms in a vein. This can happen in a vein close to the surface of the body or a vein deep inside the body.  This condition can cause serious  problems when it happens in a vein deep inside the body (deep vein thrombosis, DVT).  The main symptom of this condition is swelling and pain around the affected vein.  Treatment may include warm compresses, anti-inflammatory medicines, or blood thinners. This information is not intended to replace advice given to you by your health care provider. Make sure you discuss any questions you have with your health care provider. Document Released: 07/02/2016 Document Revised: 07/02/2016 Document Reviewed: 07/02/2016 Elsevier Interactive Patient Education  2019 [employer] Preeclampsia and Eclampsia  Preeclampsia is a serious condition that may develop during pregnancy. It is also called toxemia of pregnancy. This condition causes high blood pressure along with other symptoms, such as swelling and headaches. These symptoms may develop as the condition gets worse. Preeclampsia may occur at 20 weeks of pregnancy or later. Diagnosing and treating preeclampsia early is very important. If not treated early, it can cause serious problems for you and your baby.  One problem it can lead to is eclampsia. Eclampsia is a condition that causes muscle jerking or shaking (convulsions or seizures) and other serious problems for the mother. During pregnancy, delivering your baby may be the best treatment for preeclampsia or eclampsia. For most women, preeclampsia and eclampsia symptoms go away after giving birth. In rare cases, a woman may develop preeclampsia after giving birth (postpartum preeclampsia). This usually occurs within 48 hours after childbirth but may occur up to 6 weeks after giving birth. What are the causes? The cause of preeclampsia is not known. What increases the risk? The following risk factors make you more likely to develop preeclampsia:  Being pregnant for the first time.  Having had preeclampsia during a past pregnancy.  Having a family history of preeclampsia.  Having high blood  pressure.  Being pregnant with more than one baby.  Being 85 or older.  Being African-American.  Having kidney disease or diabetes.  Having medical conditions such as lupus or blood diseases.  Being very overweight (obese). What are the signs or symptoms? The earliest signs of preeclampsia are:  High blood pressure.  Increased protein in your urine. Your health care provider will check for this at every visit before you give birth (prenatal visit). Other symptoms that may develop as the condition gets worse include:  Severe headaches.  Sudden weight gain.  Swelling of the hands, face, legs, and feet.  Nausea and vomiting.  Vision problems, such as blurred or double vision.  Numbness in the face, arms, legs, and feet.  Urinating less than usual.  Dizziness.  Slurred speech.  Abdominal pain, especially upper abdominal pain.  Convulsions or seizures. How is this diagnosed? There are no screening tests for preeclampsia. Your health care provider will ask you about symptoms and check for signs of preeclampsia during your prenatal visits. You may also have tests that include:  Urine tests.  Blood tests.  Checking your blood pressure.  Monitoring your babys heart rate.  Ultrasound. How is this treated? You and your health care provider will determine the treatment approach that is best for you. Treatment may include:  Having more frequent prenatal exams to check for signs of preeclampsia, if you have an increased risk for preeclampsia.  Medicine to lower your blood pressure.  Staying in the hospital, if your condition is severe. There, treatment will focus on controlling your blood pressure and the amount of fluids in your body (fluid retention).  Taking medicine (magnesium sulfate) to prevent seizures. This may be given as an injection or through an IV.  Taking a low-dose aspirin during your pregnancy.  Delivering your baby early, if your condition gets  worse. You may have your labor started with medicine (induced), or you may have a cesarean delivery. Follow these instructions at home: Eating and drinking   Drink enough fluid to keep your urine pale yellow.  Avoid caffeine. Lifestyle  Do not use any products that contain nicotine or tobacco, such as cigarettes and e-cigarettes. If you need help quitting, ask your health care provider.  Do not use alcohol or drugs.  Avoid stress as much as possible. Rest and get plenty of sleep. General instructions  Take over-the-counter and prescription medicines only as told by your health care provider.  When lying down, lie on your left side. This keeps pressure off your major blood vessels.  When sitting or lying down, raise (elevate) your feet. Try putting some pillows underneath your lower legs.  Exercise regularly. Ask your health  care provider what kinds of exercise are best for you.  Keep all follow-up and prenatal visits as told by your health care provider. This is important. How is this prevented? There is no known way of preventing preeclampsia or eclampsia from developing. However, to lower your risk of complications and detect problems early:  Get regular prenatal care. Your health care provider may be able to diagnose and treat the condition early.  Maintain a healthy weight. Ask your health care provider for help managing weight gain during pregnancy.  Work with your health care provider to manage any long-term (chronic) health conditions you have, such as diabetes or kidney problems.  You may have tests of your blood pressure and kidney function after giving birth.  Your health care provider may have you take low-dose aspirin during your next pregnancy. Contact a health care provider if:  You have symptoms that your health care provider told you may require more treatment or monitoring, such as: ? Headaches. ? Nausea or vomiting. ? Abdominal  pain. ? Dizziness. ? Light-headedness. Get help right away if:  You have severe: ? Abdominal pain. ? Headaches that do not get better. ? Dizziness. ? Vision problems. ? Confusion. ? Nausea or vomiting.  You have any of the following: ? A seizure. ? Sudden, rapid weight gain. ? Sudden swelling in your hands, ankles, or face. ? Trouble moving any part of your body. ? Numbness in any part of your body. ? Trouble speaking. ? Abnormal bleeding.  You faint. Summary  Preeclampsia is a serious condition that may develop during pregnancy. It is also called toxemia of pregnancy.  This condition causes high blood pressure along with other symptoms, such as swelling and headaches.  Diagnosing and treating preeclampsia early is very important. If not treated early, it can cause serious problems for you and your baby.  Get help right away if you have symptoms that your health care provider told you to watch for. This information is not intended to replace advice given to you by your health care provider. Make sure you discuss any questions you have with your health care provider. Document Released: 01/04/2000 Document Revised: 12/23/2016 Document Reviewed: 08/13/2015 Elsevier Interactive Patient Education  2019 [employer].

## 2018-04-07 NOTE — Lactation Note (Signed)
Lactation Consultation Note  Patient Name: ABBIEGALE ALARID [id number] Date: 04/07/2018   Follow up with mom, baby is now 80 days old and left at home since mom was readmit, she's been pumping while at the hospital. Mom's breast felt soft upon examination, the right one had a few small "lumps" near the ribcage, mom voiced that she hasn't been getting a lot of milk form that breast, she's getting about 4 oz combined from both breast per pumping session. Mom was on the phone during [facility] consultation, she was appropriate but not really engaged.   Encouraged her to pump every 3 hours and at least once at night. She'll will watch for signs of engorgement and start doing lots of breast massage prior pumping. Mom aware of LC services and will call PRN.  Maternal Data    Feeding    Interventions    Lactation Tools Discussed/Used     Consult Status      Rylynn Schoneman Venetia Constable 04/07/2018, 11:46 AM

## 2018-04-07 NOTE — Progress Notes (Signed)
Patient discharged home with her mother. Prescriptions reviewed; admission discussed; medications discussed; educated about hypertension in pregnancy; discharge instructions reviewed; follow-up care reviewed; pain management discussed. Patient/caregiver verbalized understanding.

## 2018-04-07 NOTE — Progress Notes (Signed)
I have reviewed the chart and agree with nursing staff's documentation of this patient's encounter.  Eppie Barhorst, MD 04/07/2018 1:26 PM    

## 2018-04-07 NOTE — Discharge Summary (Signed)
Physician Discharge Summary  Patient ID: Jacqueline Clarke MRN: [id number] DOB/AGE: 1985-10-11 33 y.o.  Admit date: 04/05/2018 Discharge date: 04/07/2018   Discharge Diagnoses:  Principal Problem:   Postpartum hypertension Active Problems:   VBAC, delivered   Request for sterilization   Hypertension in pregnancy, preeclampsia, severe, delivered/postpartum   Thrombophlebitis   Hospital Course: Please see HPI dated 04/05/2018 for details. This is a 33 y.o. [id number] now PPD#8 from a VBAC and POD#7 s/p PPTL. She was readmitted post partum day #6 for post partum preeclampsia with severe features. She received 24 hrs MgSO4 and was started on norvasc 10 mg which did an excellent job of controlling her blood pressure. She complained of worsening pain in her right leg which had been diagnosed with thrombophlebitis, she had right lower extremity doppler which again showed no DVT. Today she reports she is feeling much better, denies headache, pain is fairly well controlled. Reports she is mostly just sore. Denies heavy bleeding, nausea/vomiting or other complaints. She was discharged home PPD#8 in good condition.   She will f/u in office in 1 week for BP check. Instructions for follow up given.   Physical exam  Vitals:   04/06/18 2017 04/07/18 0115 04/07/18 0816 04/07/18 1209  BP: 137/83 119/73 135/86 134/86  Pulse: 78 97 88 84  Resp: 18 17 18 18   Temp: 98.1 F (36.7 C) 97.8 F (36.6 C) 98.6 F (37 C) 98.5 F (36.9 C)  TempSrc: Oral Oral Oral Oral  SpO2: 100% 99% 98% 98%  Weight:      Height:       Physical Exam:  General: alert, oriented, cooperative Chest: CTAB, normal respiratory effort Heart: RRR  Abdomen: +BS, soft, appropriately tender to palpation, incision clean/dry/intact Uterine Fundus: firm, 2 fingers below the umbilicus Lochia: moderate, rubra DVT Evaluation: no evidence of DVT Extremities: right lower extremity tender to touch on lateral, upper area, otherwise  no edema, no left calf tenderness   Postpartum contraception: Tubal Ligation  Disposition: home  Discharged Condition: fair  Discharge Instructions    Call MD for:  difficulty breathing, headache or visual disturbances   Complete by:  As directed    Call MD for:  extreme fatigue   Complete by:  As directed    Call MD for:  hives   Complete by:  As directed    Call MD for:  persistant dizziness or light-headedness   Complete by:  As directed    Call MD for:  persistant nausea and vomiting   Complete by:  As directed    Call MD for:  redness, tenderness, or signs of infection (pain, swelling, redness, odor or green/yellow discharge around incision site)   Complete by:  As directed    Call MD for:  severe uncontrolled pain   Complete by:  As directed    Call MD for:  temperature >100.4   Complete by:  As directed    Diet - low sodium heart healthy   Complete by:  As directed      Allergies as of 04/07/2018   No Known Allergies     Medication List    STOP taking these medications   cyclobenzaprine 5 MG tablet Commonly known as:  FLEXERIL   enalapril 5 MG tablet Commonly known as:  Vasotec     TAKE these medications   amLODipine 10 MG tablet Commonly known as:  NORVASC Take 1 tablet (10 mg total) by mouth daily. Start taking on:  April 08, 2018   ibuprofen 200 MG tablet Commonly known as:  ADVIL,MOTRIN Take 400 mg by mouth every 6 (six) hours as needed for mild pain. What changed:  Another medication with the same name was removed. Continue taking this medication, and follow the directions you see here.   oxyCODONE 5 MG immediate release tablet Commonly known as:  Roxicodone Take 1 tablet (5 mg total) by mouth every 6 (six) hours as needed for severe pain. What changed:    when to take this  reasons to take this   senna-docusate 8.6-50 MG tablet Commonly known as:  Senokot-S Take 2 tablets by mouth daily.   sertraline 50 MG tablet Commonly known as:   ZOLOFT Take 1 tablet (50 mg total) by mouth daily.   Tylenol 325 MG tablet Generic drug:  acetaminophen Take 650 mg by mouth every 6 (six) hours as needed for moderate pain.   Vitafol Ultra 29-0.6-0.4-200 MG Caps Take 1 capsule by mouth daily before breakfast.      Follow-up Information    [facility]. Schedule an appointment as soon as possible for a visit.   Specialty:  Obstetrics and Gynecology Why:  in 1 week for blood pressure check Contact information: [address] [place] [address] [phone]           Signed: Baldemar Lenis, M.D. Attending [facility] [employer] (Faculty Practice)  04/07/2018, 2:02 PM

## 2018-04-07 NOTE — Progress Notes (Signed)
CSW received consult due to score  on 17 Edinburgh Depression Screen.    When CSW arrive, MOB was resting and bed and appeared excited about meeting with CSW as evidence by the smile on her face.  MOB recalled meeting with CSW while on the [facility].  MOB was polite, easy to engage, and receptive to meeting with CSW.   CSW active listened while MOB shared her readmitting story.  MOB shared overall feeling better physically and mentally today however "not 100% feeling." CSW validated and normalized MOB's thoughts and feelings and reminded MOB that she recently have birth and some of her feelings are expected.   CSW reviewed MOB's  Edinburgh results and re-educated MOB about  Baby Blues vs PMADs and provided MOB with resources for mental health follow up.  CSW encouraged MOB to evaluate her mental health throughout the postpartum period with the use of the New Mom Checklist developed by Postpartum [employer] as well as the [place] Postnatal Depression Scale and notify a medical professional if symptoms arise (additional copies were provided to MOB). CSW assessed for safety and MOB denied SI, HI, and DV.  MOB reported having the support of FOB and other family members and friends.   CSW offered MOB resources for outpatient counseling and MOB was receptive. CSW also provided MOB resources for medication management; MOB agreed to make contact. In the meantime MOB has an active Rx for Zoloft and she agreed to take them as prescribed.     CSW identifies no further need for intervention and no barriers to discharge at this time.  Blaine Hamper, MSW, LCSW Clinical Social Work [phone]

## 2018-04-07 NOTE — Progress Notes (Signed)
Lower extremity venous has been completed.   Preliminary results in CV Proc.   Blanch Media 04/07/2018 1:35 PM

## 2018-04-08 LAB — BPAM RBC
Blood Product Expiration Date: 202004102359
Blood Product Expiration Date: 202004102359
Unit Type and Rh: 9500
Unit Type and Rh: 9500

## 2018-04-08 LAB — TYPE AND SCREEN
ABO/RH(D): O NEG
Antibody Screen: POSITIVE
Unit division: 0
Unit division: 0

## 2018-04-12 ENCOUNTER — Other Ambulatory Visit: Payer: Self-pay

## 2018-04-12 ENCOUNTER — Ambulatory Visit (INDEPENDENT_AMBULATORY_CARE_PROVIDER_SITE_OTHER): Payer: Medicaid Other

## 2018-04-12 VITALS — BP 135/95 | HR 77 | Resp 16 | Ht 71.0 in | Wt 292.0 lb

## 2018-04-12 DIAGNOSIS — O1415 Severe pre-eclampsia, complicating the puerperium: Secondary | ICD-10-CM

## 2018-04-12 NOTE — Progress Notes (Signed)
Subjective:  Jacqueline Clarke is a 33 y.o. female here for BP check. Patient reports dull frontal headache -PS 4-5/10. Patient reports taking blood pressure medication around 10-10:30 am.  Hypertension ROS: taking medications as instructed, no medication side effects noted, patient does not perform home BP monitoring, no TIA's, no chest pain on exertion, no dyspnea on exertion and noting swelling of ankles.    Objective:  Blood Pressure: Right:  141/91 81                             Left arm: 135/95 77 Appearance alert, well appearing, and in no distress, oriented to person, place, and time and overweight. General exam BP noted to be well controlled today in office.    Assessment:   Blood Pressure is elevated today: Right arm: 141/91 81 Left arm: 135/95 77  Plan:  Current treatment plan is effective, no change in therapy. Reviewed diet, exercise and weight control. Recommended sodium restriction. Cardiovascular risk and specific lipid/LDL goals reviewed. BP check in a week.

## 2018-04-12 NOTE — Progress Notes (Signed)
I have reviewed the chart and agree with nursing staff's documentation of this patient's encounter.  Kassidee Narciso, MD 04/12/2018 3:47 PM    

## 2018-05-05 ENCOUNTER — Other Ambulatory Visit: Payer: Self-pay

## 2018-05-05 ENCOUNTER — Encounter: Payer: Self-pay | Admitting: Advanced Practice Midwife

## 2018-05-05 ENCOUNTER — Ambulatory Visit: Payer: Medicaid Other | Admitting: Obstetrics

## 2018-05-05 ENCOUNTER — Ambulatory Visit (INDEPENDENT_AMBULATORY_CARE_PROVIDER_SITE_OTHER): Payer: Medicaid Other | Admitting: Advanced Practice Midwife

## 2018-05-05 DIAGNOSIS — Z9851 Tubal ligation status: Secondary | ICD-10-CM

## 2018-05-05 DIAGNOSIS — F329 Major depressive disorder, single episode, unspecified: Secondary | ICD-10-CM

## 2018-05-05 DIAGNOSIS — O99340 Other mental disorders complicating pregnancy, unspecified trimester: Secondary | ICD-10-CM

## 2018-05-05 DIAGNOSIS — F32A Depression, unspecified: Secondary | ICD-10-CM

## 2018-05-05 MED ORDER — IBUPROFEN 800 MG PO TABS: 800.0000 mg | ORAL_TABLET | Freq: Three times a day (TID) | ORAL | 0 refills | Status: DC | PRN

## 2018-05-05 MED ORDER — ACETAMINOPHEN 325 MG PO TABS: 650.0000 mg | ORAL_TABLET | ORAL | 2 refills | Status: AC | PRN

## 2018-05-05 MED ORDER — KETOROLAC TROMETHAMINE 10 MG PO TABS: 10.0000 mg | ORAL_TABLET | Freq: Four times a day (QID) | ORAL | 0 refills | Status: DC | PRN

## 2018-05-05 NOTE — Progress Notes (Addendum)
TELEHEALTH VIRTUAL POSTPARTUM VISIT ENCOUNTER NOTE  I connected with Jacqueline Clarke on 05/05/18 at  2:15 PM EDT by telephone at home and verified that I am speaking with the correct person using two identifiers.   I discussed the limitations, risks, security and privacy concerns of performing an evaluation and management service by telephone and the availability of in person appointments. I also discussed with the patient that there may be a patient responsible charge related to this service. The patient expressed understanding and agreed to proceed.  Appointment Date: 05/05/2018  OBGYN Clinic: [facility]  Chief Complaint:  Chief Complaint  Patient presents with  . Postpartum Care    History of Present Illness: Jacqueline Clarke is a 33 y.o. African-American [id number] (No LMP recorded.), seen for the above chief complaint. Her past medical history is significant for has Rh negative status during pregnancy; Morbid obesity (HCC); History of gestational hypertension; Depression, postpartum; Adjustment insomnia; Traumatic injury during pregnancy; Obesity affecting pregnancy, antepartum; H/O: C-section; VBAC, delivered; Request for sterilization; Hypertension in pregnancy, preeclampsia, severe, delivered/postpartum; Postpartum hypertension; and Thrombophlebitis on their problem list.   She is s/p normal spontaneous vaginal delivery on 03/31/18 at 39 weeks; she was discharged to home on 04/02/18  Pregnancy complicated by postpartum readmission for severe preeclampsia with Magnesium Sulfate administration. Baby is doing well.  Complains of abdominal and pelvic pain. Patient denies urinary symptoms. Reports regular bowel movements and flatus.   Vaginal bleeding or discharge: No  Mode of feeding infant: Bottle Intercourse: No  Contraception: bilateral tubal ligation PP depression s/s: Yes . Denies SI or HI, has appointment with Behavioral Health this Friday 05/07/18 Any bowel or  bladder issues: No  Pap smear: no abnormalities (10/07/2017)  Review of Systems: Positive for pelvic pain. Her 12 point review of systems is negative or as noted in the History of Present Illness.  Patient Active Problem List   Diagnosis Date Noted  . Thrombophlebitis 04/07/2018  . Hypertension in pregnancy, preeclampsia, severe, delivered/postpartum 04/05/2018  . Postpartum hypertension 04/05/2018  . VBAC, delivered 03/31/2018  . Request for sterilization 03/31/2018  . H/O: C-section 03/26/2018  . Obesity affecting pregnancy, antepartum 11/06/2017  . Supervision of high risk pregnancy, antepartum 10/07/2017  . Traumatic injury during pregnancy 09/28/2017  . Depression, postpartum 07/09/2016  . Adjustment insomnia 07/09/2016  . History of gestational hypertension 06/30/2016  . Rh negative status during pregnancy   . Morbid obesity (HCC)     Medications Jacqueline Clarke had no medications administered during this visit. Current Outpatient Medications  Medication Sig Dispense Refill  . acetaminophen (TYLENOL) 325 MG tablet Take 650 mg by mouth every 6 (six) hours as needed for moderate pain.     Marland Kitchen. amLODipine (NORVASC) 10 MG tablet Take 1 tablet (10 mg total) by mouth daily. 30 tablet 2  . ibuprofen (ADVIL,MOTRIN) 200 MG tablet Take 400 mg by mouth every 6 (six) hours as needed for mild pain.    . Prenat-Fe Poly-Methfol-FA-DHA (VITAFOL ULTRA) 29-0.6-0.4-200 MG CAPS Take 1 capsule by mouth daily before breakfast. 90 capsule 4  . senna-docusate (SENOKOT-S) 8.6-50 MG tablet Take 2 tablets by mouth daily. 30 tablet 0  . sertraline (ZOLOFT) 50 MG tablet Take 1 tablet (50 mg total) by mouth daily. 30 tablet 0   No current facility-administered medications for this visit.     Allergies Patient has no known allergies.  Physical Exam:  General:  Alert, oriented and cooperative.   Mental Status: Normal mood and affect  perceived. Normal judgment and thought content.  Rest of  physical exam deferred due to type of encounter  PP Depression Screening:    Assessment:Patient is a 33 y.o. [id number] who is five weeks postpartum from a normal spontaneous vaginal delivery and tubal ligation was performed.  She is doing well.   Plan:  1. Encounter for postpartum care and examination after delivery - No concerning findings. - Finish daily Norvasc 10, follow up with PCP as needed - Patient has not checked blood pressure since discharge from hospital. Advised staff at [facility] visit this week may be willing to obtain BP reading. If not, use retail cuff ([facility], Nicolette Bang) and call clinic if elevated > 140/90. Check BP this week. Patient agreeable - Patient to establish care with PCP for ongoing health maintenence  2. Pelvic pain  - Add more consistent regimen of Tylenol, Toradol and zero/low impact exercise for pelvic pain  3. Depression affecting pregnancy - No issues today, has scheduled appointment with [facility] this week - No thoughts of SI or HI   RTC September 2020 for routine annual well woman appointment  I discussed the assessment and treatment plan with the patient. The patient was provided an opportunity to ask questions and all were answered. The patient agreed with the plan and demonstrated an understanding of the instructions.   The patient was advised to call back or seek an in-person evaluation/go to the ED for any concerning postpartum symptoms.  I provided 15 minutes of non-face-to-face time during this encounter.   Clayton Bibles, CNM 05/05/18 2:32 PM

## 2018-06-09 ENCOUNTER — Encounter: Payer: Self-pay | Admitting: Pediatrics

## 2018-09-02 ENCOUNTER — Emergency Department (HOSPITAL_COMMUNITY)
Admission: EM | Admit: 2018-09-02 | Discharge: 2018-09-02 | Disposition: A | Discharge status: Home / Self Care | Payer: Medicaid Other | Attending: Emergency Medicine | Admitting: Emergency Medicine

## 2018-09-02 ENCOUNTER — Other Ambulatory Visit: Payer: Self-pay

## 2018-09-02 ENCOUNTER — Encounter (HOSPITAL_COMMUNITY): Payer: Self-pay | Admitting: *Deleted

## 2018-09-02 DIAGNOSIS — J45909 Unspecified asthma, uncomplicated: Secondary | ICD-10-CM | POA: Diagnosis not present

## 2018-09-02 DIAGNOSIS — R32 Unspecified urinary incontinence: Secondary | ICD-10-CM | POA: Diagnosis not present

## 2018-09-02 DIAGNOSIS — Z87891 Personal history of nicotine dependence: Secondary | ICD-10-CM | POA: Diagnosis not present

## 2018-09-02 DIAGNOSIS — Z79899 Other long term (current) drug therapy: Secondary | ICD-10-CM | POA: Insufficient documentation

## 2018-09-02 DIAGNOSIS — M5441 Lumbago with sciatica, right side: Secondary | ICD-10-CM

## 2018-09-02 MED ORDER — PREDNISONE 20 MG PO TABS: ORAL_TABLET | ORAL | 0 refills | Status: DC

## 2018-09-02 MED ORDER — METHOCARBAMOL 500 MG PO TABS: 500.0000 mg | ORAL_TABLET | Freq: Two times a day (BID) | ORAL | 0 refills | Status: DC

## 2018-09-02 MED ORDER — IBUPROFEN 600 MG PO TABS: 600.0000 mg | ORAL_TABLET | Freq: Four times a day (QID) | ORAL | 0 refills | Status: DC | PRN

## 2018-09-02 NOTE — ED Triage Notes (Signed)
Pt reports back pain and rt leg pain. Pt reports multiple back injuries and hx of scoliosis. Pt reports new shooting pains and a "weak" feeling in her bladder. Pt ambulatory in triage.

## 2018-09-02 NOTE — ED Provider Notes (Signed)
[facility] Provider Note   CSN: [id number] Arrival date & time: 09/02/18  1432     History   Chief Complaint Chief Complaint  Patient presents with  . Back Pain    HPI Jacqueline Clarke is a 33 y.o. female.     The history is provided by the patient and medical records. No language interpreter was used.  Back Pain    33 year old morbidly obese female with history of scoliosis, and multiple back injuries, chronic back pain, bipolar disorder, anxiety, remote history of cocaine use presenting complaining of back and leg pain.  Patient report for the past 3 weeks she has had progressive worsening lower back pain that radiates down her right leg.  Pain is described as sharp, shooting, worsening with prolonged sitting or with walking.  Rates the pain a 7 out of 10.  For the past week she noticed increasing pain down her right leg.  She also report having "weak bladder" in which she felt she has to urinate often but denies any burning sensation when urinating or any bowel or bladder incontinence or saddle anesthesia.  She has been taken over-the-counter medication including Tylenol and ibuprofen at home without adequate relief.  She does not complain of any fever chills no abdominal pain nausea vomiting dysuria hematuria.  She denies any fever or rash.  No history of IV drug use or active cancer.  She gave birth 5 months ago and states having her low back pain makes it difficult for her to maneuver around to take care of her kid.  She is not currently breast-feeding.  No history of diabetes.   Past Medical History:  Diagnosis Date  . Anemia   . Anxiety   . Asthma   . Bipolar disorder (HCC)   . Blood type, Rh negative   . Cholelithiasis   . Chronic back pain    scoliosis- on percocet  . Depression   . Headache(784.0)   . History of cocaine use   . History of ileus 07/06/2016   After cesarean section  . Hyperemesis complicating pregnancy,  antepartum 2013  . 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 Low vitamin D level 01/03/2016  . Obesity   . Pregnancy induced hypertension   . Pyelonephritis 07/03/2016   postpartum  . Scoliosis   . Supervision of high risk pregnancy, antepartum 10/07/2017    Nursing Staff Provider Office Location  [facility] Dating   Language  English Anatomy US   Flu Vaccine  Declined 02/02/2018 Genetic Screen  NIPS:   AFP:   First Screen:  Quad:   TDaP vaccine   declined  02/02/2018 Hgb A1C or  GTT Early  Third trimester  Rhogam   02/02/2018   LAB RESULTS  Feeding Plan Breast/Bottle Blood Type O/Negative/-- (09/18 1525) ONeg Contraception BTL? Antibody Negative (09/18    Patient Active Problem List   Diagnosis Date Noted  . Thrombophlebitis 04/07/2018  . Hypertension in pregnancy, preeclampsia, severe, delivered/postpartum 04/05/2018  . Postpartum hypertension 04/05/2018  . VBAC, delivered 03/31/2018  . Request for sterilization 03/31/2018  . H/O: C-section 03/26/2018  . Obesity affecting pregnancy, antepartum 11/06/2017  . Traumatic injury during pregnancy 09/28/2017  . Depression, postpartum 07/09/2016  . Adjustment insomnia 07/09/2016  . History of gestational hypertension 06/30/2016  . Rh negative status during pregnancy   . Morbid obesity (HCC)     Past Surgical History:  Procedure Laterality Date  . arm surgery    .  CESAREAN SECTION N/A 06/23/2016   Procedure: CESAREAN SECTION;  Surgeon: Aletha Halim, MD;  Location: [place];  Service: Obstetrics;  Laterality: N/A;  . CHOLECYSTECTOMY    . DILATION AND CURETTAGE OF UTERUS  2008  . MR WRIST RIGHT     sx to nerves  . NERVE, TENDON AND ARTERY REPAIR Right 06/18/2014   Procedure: NERVE, TENDON AND ARTERY REPAIR;  Surgeon: Iran Planas, MD;  Location: [place];  Service: Orthopedics;  Laterality: Right;  . TONSILLECTOMY AND ADENOIDECTOMY    . TUBAL LIGATION N/A 04/01/2018   Procedure: POST PARTUM TUBAL LIGATION;  Surgeon: Osborne Oman, MD;  Location: [facility];  Service: Gynecology;  Laterality: N/A;  . VAGINAL DELIVERY  06/23/2016   Procedure: VAGINAL DELIVERY;  Surgeon: Aletha Halim, MD;  Location: [place];  Service: Obstetrics;;     OB History    Gravida  8   Para  6   Term  5   Preterm  1   AB  2   Living  7     SAB  2   TAB  0   Ectopic  0   Multiple  1   Live Births  7            Home Medications    Prior to Admission medications   Medication Sig Start Date End Date Taking? Authorizing Provider  acetaminophen (TYLENOL) 325 MG tablet Take 2 tablets (650 mg total) by mouth every 4 (four) hours as needed. 05/05/18 05/05/19  Darlina Rumpf, CNM  amLODipine (NORVASC) 10 MG tablet Take 1 tablet (10 mg total) by mouth daily. 04/08/18   Sloan Leiter, MD  ketorolac (TORADOL) 10 MG tablet Take 1 tablet (10 mg total) by mouth every 6 (six) hours as needed. 05/05/18   Darlina Rumpf, CNM  Prenat-Fe Poly-Methfol-FA-DHA (VITAFOL ULTRA) 29-0.6-0.4-200 MG CAPS Take 1 capsule by mouth daily before breakfast. 02/02/18   Shelly Bombard, MD  senna-docusate (SENOKOT-S) 8.6-50 MG tablet Take 2 tablets by mouth daily. 04/03/18   Mullis, Kiersten P, DO  sertraline (ZOLOFT) 50 MG tablet Take 1 tablet (50 mg total) by mouth daily. 04/03/18   Mullis, Archie Endo, DO    Family History Family History  Problem Relation Age of Onset  . Heart disease Maternal Grandfather   . Diabetes Maternal Grandfather   . Heart disease Father   . Anesthesia problems Neg Hx     Social History Social History   Tobacco Use  . Smoking status: Former Smoker    Packs/day: 0.25    Types: Cigarettes    Quit date: 08/02/2017    Years since quitting: 1.0  . Smokeless tobacco: Never Used  Substance Use Topics  . Alcohol use: Not Currently    Alcohol/week: 0.0 standard drinks    Comment: Occassionally  . Drug use: Not Currently    Types: Cocaine    Comment: UDS + 11/22/17     Allergies    Patient has no known allergies.   Review of Systems Review of Systems  Musculoskeletal: Positive for back pain.  All other systems reviewed and are negative.    Physical Exam Updated Vital Signs BP (!) 160/100 (BP Location: Right Arm)   Pulse 81   Temp (!) 97.3 F (36.3 C) (Oral)   Resp 20   SpO2 99%   Physical Exam Vitals signs and nursing note reviewed.  Constitutional:      General: She is not in acute  distress.    Appearance: She is well-developed. She is obese.     Comments: Patient is morbidly obese.  Resting comfortably in no acute discomfort.  HENT:     Head: Atraumatic.  Eyes:     Conjunctiva/sclera: Conjunctivae normal.  Neck:     Musculoskeletal: Neck supple.  Abdominal:     Palpations: Abdomen is soft.     Tenderness: There is no abdominal tenderness.  Musculoskeletal:        General: Tenderness (Tenderness to lumbar and paralumbar spinal muscle on palpation without any overlying skin changes no crepitus or step-off.  Exam is limited due to large body habitus.  Positive straight leg raise on the right.) present.  Skin:    Findings: No rash.  Neurological:     Mental Status: She is alert.     Comments: Patellar deep tendon reflex intact bilaterally.  Equal strength to bilateral lower extremities.  Intact distal pedal pulses bilaterally.      ED Treatments / Results  Labs (all labs ordered are listed, but only abnormal results are displayed) Labs Reviewed - No data to display  EKG None  Radiology No results found.  Procedures Procedures (including critical care time)  Medications Ordered in ED Medications - No data to display   Initial Impression / Assessment and Plan / ED Course  I have reviewed the triage vital signs and the nursing notes.  Pertinent labs & imaging results that were available during my care of the patient were reviewed by me and considered in my medical decision making (see chart for details).        BP (!) 135/97    Pulse 68   Temp (!) 97.3 F (36.3 C) (Oral)   Resp 20   SpO2 100%    Final Clinical Impressions(s) / ED Diagnoses   Final diagnoses:  Acute back pain with sciatica, right    ED Discharge Orders         Ordered    predniSONE (DELTASONE) 20 MG tablet     09/02/18 1553    ibuprofen (ADVIL) 600 MG tablet  Every 6 hours PRN     09/02/18 1553    methocarbamol (ROBAXIN) 500 MG tablet  2 times daily     09/02/18 1553         3:46 PM No red flag s/s of low back pain. Patient was counseled on back pain precautions and told to do activity as tolerated but do not lift, push, or pull heavy objects more than 10 pounds for the next week. Patient counseled to use ice or heat on back for no longer than 15 minutes every hour.   Patient prescribed muscle relaxer and counseled on proper use of muscle relaxant medication.    Counseled not to combine this medication with others containing tylenol.  Urged patient not to drink alcohol, drive, or perform any other activities that requires focus while taking either of these medications.   Patient urged to follow-up with PCP if pain does not improve with treatment and rest or if pain becomes recurrent. Urged to return with worsening severe pain, loss of bowel or bladder control, trouble walking.  The patient verbalizes understanding and agrees with the plan.    Fayrene Helperran, Eshawn Coor, PA-C 09/02/18 1555    Sabas SousBero, Michael M, MD 09/02/18 [phone]

## 2018-09-12 ENCOUNTER — Emergency Department (HOSPITAL_COMMUNITY)
Admission: EM | Admit: 2018-09-12 | Discharge: 2018-09-12 | Disposition: A | Discharge status: Home / Self Care | Payer: Medicaid Other | Attending: Emergency Medicine | Admitting: Emergency Medicine

## 2018-09-12 ENCOUNTER — Encounter (HOSPITAL_COMMUNITY): Payer: Self-pay

## 2018-09-12 ENCOUNTER — Other Ambulatory Visit: Payer: Self-pay

## 2018-09-12 DIAGNOSIS — Z87891 Personal history of nicotine dependence: Secondary | ICD-10-CM | POA: Diagnosis not present

## 2018-09-12 DIAGNOSIS — Z79899 Other long term (current) drug therapy: Secondary | ICD-10-CM | POA: Insufficient documentation

## 2018-09-12 DIAGNOSIS — M5431 Sciatica, right side: Secondary | ICD-10-CM | POA: Insufficient documentation

## 2018-09-12 DIAGNOSIS — J45909 Unspecified asthma, uncomplicated: Secondary | ICD-10-CM | POA: Diagnosis not present

## 2018-09-12 MED ORDER — HYDROCODONE-ACETAMINOPHEN 5-325 MG PO TABS
1.0000 | ORAL_TABLET | Freq: Once | ORAL | Status: AC
  Administered 2018-09-12: 1 via ORAL
  Filled 2018-09-12: qty 1

## 2018-09-12 MED ORDER — CYCLOBENZAPRINE HCL 10 MG PO TABS: 10.0000 mg | ORAL_TABLET | Freq: Two times a day (BID) | ORAL | 0 refills | Status: DC | PRN

## 2018-09-12 MED ORDER — KETOROLAC TROMETHAMINE 60 MG/2ML IM SOLN
30.0000 mg | Freq: Once | INTRAMUSCULAR | Status: AC
  Administered 2018-09-12: 30 mg via INTRAMUSCULAR
  Filled 2018-09-12: qty 2

## 2018-09-12 NOTE — ED Provider Notes (Signed)
[facility]-EMERGENCY DEPT Provider Note   CSN: [id number] Arrival date & time: 09/12/18  1323     History   Chief Complaint Chief Complaint  Patient presents with   Back Pain    HPI Jacqueline Clarke is a 33 y.o. female.  Presents emerged from with chief complaint back pain, radiating down right leg.  Pain has been going on for approximately 3 weeks.  States that she has had episodes of pain like this in the past.  Given our emergency department on August 13 for these symptoms and was given a prescription for steroids.  States that she finished the course of steroids but this did not provide any significant relief.  Has also been taking Robaxin and ibuprofen.  Again not having any significant relief.  States pain is up to 1010 severity, sharp, stabbing, radiates down right leg.  Patient states that she had a brief episode of tingling in her right leg earlier today which rates concerned about this pain.  States that the tingling sensation resolved spontaneously and she does not have any persistent numbness.  Has not had any weakness, no bladder or bowel incontinence, no urinary retention.  Patient denies prior history of IV drug use.  Denies prior history of T or L-spine trauma.  Reviewed notes from recent ER visit on 8/13    HPI  Past Medical History:  Diagnosis Date   Anemia    Anxiety    Asthma    Bipolar disorder (HCC)    Blood type, Rh negative    Cholelithiasis    Chronic back pain    scoliosis- on percocet   Depression    Headache(784.0)    History of cocaine use    History of ileus 07/06/2016   After cesarean section   Hyperemesis complicating pregnancy, antepartum 2013   Infection    hx MRSA; 3 negative tests since   Low vitamin D level 01/03/2016   Obesity    Pregnancy induced hypertension    Pyelonephritis 07/03/2016   postpartum   Scoliosis    Supervision of high risk pregnancy, antepartum 10/07/2017    Nursing  Staff Provider Office Location  [facility] Dating   Language  English Anatomy US   Flu Vaccine  Declined 02/02/2018 Genetic Screen  NIPS:   AFP:   First Screen:  Quad:   TDaP vaccine   declined  02/02/2018 Hgb A1C or  GTT Early  Third trimester  Rhogam   02/02/2018   LAB RESULTS  Feeding Plan Breast/Bottle Blood Type O/Negative/-- (09/18 1525) ONeg Contraception BTL? Antibody Negative (09/18    Patient Active Problem List   Diagnosis Date Noted   Thrombophlebitis 04/07/2018   Hypertension in pregnancy, preeclampsia, severe, delivered/postpartum 04/05/2018   Postpartum hypertension 04/05/2018   VBAC, delivered 03/31/2018   Request for sterilization 03/31/2018   H/O: C-section 03/26/2018   Obesity affecting pregnancy, antepartum 11/06/2017   Traumatic injury during pregnancy 09/28/2017   Depression, postpartum 07/09/2016   Adjustment insomnia 07/09/2016   History of gestational hypertension 06/30/2016   Rh negative status during pregnancy    Morbid obesity (HCC)     Past Surgical History:  Procedure Laterality Date   arm surgery     CESAREAN SECTION N/A 06/23/2016   Procedure: CESAREAN SECTION;  Surgeon: Henryetta BingPickens, Charlie, MD;  [facility];  Service: Obstetrics;  Laterality: N/A;   CHOLECYSTECTOMY     DILATION AND CURETTAGE OF UTERUS  2008   MR WRIST RIGHT  sx to nerves   NERVE, TENDON AND ARTERY REPAIR Right 06/18/2014   Procedure: NERVE, TENDON AND ARTERY REPAIR;  Surgeon: Bradly BienenstockFred Ortmann, MD;  Location: [facility] OR;  Service: Orthopedics;  Laterality: Right;   TONSILLECTOMY AND ADENOIDECTOMY     TUBAL LIGATION N/A 04/01/2018   Procedure: POST PARTUM TUBAL LIGATION;  Surgeon: Tereso NewcomerAnyanwu, Ugonna A, MD;  Location: [facility];  Service: Gynecology;  Laterality: N/A;   VAGINAL DELIVERY  06/23/2016   Procedure: VAGINAL DELIVERY;  Surgeon: Kenvir BingPickens, Charlie, MD;  [facility];  Service: Obstetrics;;     OB History    Gravida  8   Para  6   Term    5   Preterm  1   AB  2   Living  7     SAB  2   TAB  0   Ectopic  0   Multiple  1   Live Births  7            Home Medications    Prior to Admission medications   Medication Sig Start Date End Date Taking? Authorizing Provider  acetaminophen (TYLENOL) 325 MG tablet Take 2 tablets (650 mg total) by mouth every 4 (four) hours as needed. 05/05/18 05/05/19  Calvert CantorWeinhold, Samantha C, CNM  amLODipine (NORVASC) 10 MG tablet Take 1 tablet (10 mg total) by mouth daily. 04/08/18   Conan Bowensavis, Kelly M, MD  ibuprofen (ADVIL) 600 MG tablet Take 1 tablet (600 mg total) by mouth every 6 (six) hours as needed. 09/02/18   Fayrene Helperran, Bowie, PA-C  ketorolac (TORADOL) 10 MG tablet Take 1 tablet (10 mg total) by mouth every 6 (six) hours as needed. 05/05/18   Calvert CantorWeinhold, Samantha C, CNM  methocarbamol (ROBAXIN) 500 MG tablet Take 1 tablet (500 mg total) by mouth 2 (two) times daily. 09/02/18   Fayrene Helperran, Bowie, PA-C  predniSONE (DELTASONE) 20 MG tablet 3 tabs po day one, then 2 tabs daily x 4 days 09/02/18   Fayrene Helperran, Bowie, PA-C  Prenat-Fe Poly-Methfol-FA-DHA (VITAFOL ULTRA) 29-0.6-0.4-200 MG CAPS Take 1 capsule by mouth daily before breakfast. 02/02/18   Brock BadHarper, Charles A, MD  senna-docusate (SENOKOT-S) 8.6-50 MG tablet Take 2 tablets by mouth daily. 04/03/18   Mullis, Kiersten P, DO  sertraline (ZOLOFT) 50 MG tablet Take 1 tablet (50 mg total) by mouth daily. 04/03/18   Mullis, Dara LordsKiersten P, DO    Family History Family History  Problem Relation Age of Onset   Heart disease Maternal Grandfather    Diabetes Maternal Grandfather    Heart disease Father    Anesthesia problems Neg Hx     Social History Social History   Tobacco Use   Smoking status: Former Smoker    Packs/day: 0.25    Types: Cigarettes    Quit date: 08/02/2017    Years since quitting: 1.1   Smokeless tobacco: Never Used  Substance Use Topics   Alcohol use: Not Currently    Alcohol/week: 0.0 standard drinks    Comment: Occassionally   Drug  use: Not Currently    Types: Cocaine    Comment: UDS + 11/22/17     Allergies   Patient has no known allergies.   Review of Systems Review of Systems  Constitutional: Negative for chills and fever.  HENT: Negative for ear pain and sore throat.   Eyes: Negative for pain and visual disturbance.  Respiratory: Negative for cough and shortness of breath.   Cardiovascular: Negative for chest pain and palpitations.  Gastrointestinal: Negative  for abdominal pain and vomiting.  Genitourinary: Negative for dysuria and hematuria.  Musculoskeletal: Positive for back pain. Negative for arthralgias.  Skin: Negative for color change and rash.  Neurological: Negative for seizures and syncope.  All other systems reviewed and are negative.    Physical Exam Updated Vital Signs BP (!) 142/89 (BP Location: Right Arm)    Pulse 84    Temp 99 F (37.2 C) (Oral)    Resp 17    Ht 5\' 10"  (1.778 m)    Wt 122.5 kg    SpO2 100%    BMI 38.74 kg/m   Physical Exam Vitals signs and nursing note reviewed.  Constitutional:      General: She is not in acute distress.    Appearance: She is well-developed.  HENT:     Head: Normocephalic and atraumatic.  Eyes:     Conjunctiva/sclera: Conjunctivae normal.  Neck:     Musculoskeletal: Neck supple.  Cardiovascular:     Rate and Rhythm: Normal rate and regular rhythm.     Heart sounds: No murmur.  Pulmonary:     Effort: Pulmonary effort is normal. No respiratory distress.     Breath sounds: Normal breath sounds.  Abdominal:     Palpations: Abdomen is soft.     Tenderness: There is no abdominal tenderness.  Musculoskeletal:     Comments: Back: No midline T or L spine tenderness.  There is tenderness palpation lower right lumbar region RLE: no edema or deformity, no TTP throughout leg; 5/5 strength throughout, sensation intact throughout distal pulses intact LLE: no edema or deformity, no TTP throughout leg; 5/5 strength throughout, sensation intact  throughout distal pulses intact  Skin:    General: Skin is warm and dry.  Neurological:     General: No focal deficit present.     Mental Status: She is alert and oriented to person, place, and time.     Gait: Gait normal.      ED Treatments / Results  Labs (all labs ordered are listed, but only abnormal results are displayed) Labs Reviewed - No data to display  EKG None  Radiology No results found.  Procedures Procedures (including critical care time)  Medications Ordered in ED Medications - No data to display   Initial Impression / Assessment and Plan / ED Course  I have reviewed the triage vital signs and the nursing notes.  Pertinent labs & imaging results that were available during my care of the patient were reviewed by me and considered in my medical decision making (see chart for details).       33 year old lady presents the emergency department with low back pain radiating down right leg.  On exam well-appearing, normal neurologic exam.  Ambulated without difficulty.  No red flag symptoms.  No IVDU history.  No trauma.  Do not feel emergent CT or MRI imaging needed at this time.  Based on symptomatology suspect sciatica.  Has already completed course of steroids.  Will give dose pain medicine here, recommend trial of muscle relaxer and PCP follow-up.  Reviewed specific return precautions.  Will discharge home.    After the discussed management above, the patient was determined to be safe for discharge.  The patient was in agreement with this plan and all questions regarding their care were answered.  ED return precautions were discussed and the patient will return to the ED with any significant worsening of condition.     Final Clinical Impressions(s) / ED Diagnoses  Final diagnoses:  Sciatica of right side    ED Discharge Orders    None       Milagros Lollykstra, Mando Blatz S, MD 09/12/18 1440

## 2018-09-12 NOTE — Discharge Instructions (Addendum)
Recommend taking Tylenol, Motrin and muscle relaxer as prescribed for your symptoms.  Please call the number provided to establish a primary care doctor and follow-up with them regarding ongoing management of your back pain.  If though, you develop numbness, any weakness, bladder or bowel incontinence, groin numbness, urinary retention or other new concerning symptoms please return to the ER for reassessment.

## 2018-09-12 NOTE — ED Triage Notes (Signed)
For about 3 weeks lower back pain, and went to doctor about a week ago and was treated for sciatica and still no better states pain is severe.

## 2018-12-23 ENCOUNTER — Encounter (HOSPITAL_COMMUNITY): Payer: Self-pay

## 2018-12-23 ENCOUNTER — Emergency Department (HOSPITAL_COMMUNITY)
Admission: EM | Admit: 2018-12-23 | Discharge: 2018-12-24 | Disposition: A | Discharge status: Home / Self Care | Payer: Medicaid Other | Attending: Emergency Medicine | Admitting: Emergency Medicine

## 2018-12-23 DIAGNOSIS — J45909 Unspecified asthma, uncomplicated: Secondary | ICD-10-CM | POA: Diagnosis not present

## 2018-12-23 DIAGNOSIS — Z87891 Personal history of nicotine dependence: Secondary | ICD-10-CM | POA: Diagnosis not present

## 2018-12-23 DIAGNOSIS — Z79899 Other long term (current) drug therapy: Secondary | ICD-10-CM | POA: Insufficient documentation

## 2018-12-23 DIAGNOSIS — R569 Unspecified convulsions: Secondary | ICD-10-CM

## 2018-12-23 DIAGNOSIS — R519 Headache, unspecified: Secondary | ICD-10-CM | POA: Diagnosis not present

## 2018-12-23 LAB — CBC WITH DIFFERENTIAL/PLATELET
Abs Immature Granulocytes: 0.02 10*3/uL (ref 0.00–0.07)
Basophils Absolute: 0.1 10*3/uL (ref 0.0–0.1)
Basophils Relative: 1 %
Eosinophils Absolute: 0.1 10*3/uL (ref 0.0–0.5)
Eosinophils Relative: 2 %
HCT: 36.5 % (ref 36.0–46.0)
Hemoglobin: 11.5 g/dL — ABNORMAL LOW (ref 12.0–15.0)
Immature Granulocytes: 0 %
Lymphocytes Relative: 30 %
Lymphs Abs: 2.8 10*3/uL (ref 0.7–4.0)
MCH: 26.4 pg (ref 26.0–34.0)
MCHC: 31.5 g/dL (ref 30.0–36.0)
MCV: 83.7 fL (ref 80.0–100.0)
Monocytes Absolute: 0.7 10*3/uL (ref 0.1–1.0)
Monocytes Relative: 8 %
Neutro Abs: 5.7 10*3/uL (ref 1.7–7.7)
Neutrophils Relative %: 59 %
Platelets: 294 10*3/uL (ref 150–400)
RBC: 4.36 MIL/uL (ref 3.87–5.11)
RDW: 15 % (ref 11.5–15.5)
WBC: 9.4 10*3/uL (ref 4.0–10.5)
nRBC: 0 % (ref 0.0–0.2)

## 2018-12-23 LAB — CBG MONITORING, ED: Glucose-Capillary: 92 mg/dL (ref 70–99)
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
## 2018-12-24 ENCOUNTER — Other Ambulatory Visit: Payer: Self-pay

## 2018-12-24 ENCOUNTER — Emergency Department (HOSPITAL_COMMUNITY): Payer: Medicaid Other

## 2018-12-24 LAB — COMPREHENSIVE METABOLIC PANEL
ALT: 16 U/L (ref 0–44)
AST: 24 U/L (ref 15–41)
Albumin: 3.6 g/dL (ref 3.5–5.0)
Alkaline Phosphatase: 76 U/L (ref 38–126)
Anion gap: 10 (ref 5–15)
BUN: 12 mg/dL (ref 6–20)
CO2: 22 mmol/L (ref 22–32)
Calcium: 9 mg/dL (ref 8.9–10.3)
Chloride: 109 mmol/L (ref 98–111)
Creatinine, Ser: 1.08 mg/dL — ABNORMAL HIGH (ref 0.44–1.00)
GFR calc Af Amer: >60 mL/min (ref >60)
GFR calc non Af Amer: >60 mL/min (ref >60)
Glucose, Bld: 86 mg/dL (ref 70–99)
Potassium: 3.9 mmol/L (ref 3.5–5.1)
Sodium: 141 mmol/L (ref 135–145)
Total Bilirubin: 1.1 mg/dL (ref 0.3–1.2)
Total Protein: 6.8 g/dL (ref 6.5–8.1)

## 2018-12-24 LAB — I-STAT BETA HCG BLOOD, ED (MC, WL, AP ONLY): I-stat hCG, quantitative: <5 m[IU]/mL (ref <5)

## 2018-12-24 LAB — CK: Total CK: 291 U/L — ABNORMAL HIGH (ref 38–234)

## 2018-12-24 LAB — ETHANOL: Alcohol, Ethyl (B): <10 mg/dL (ref <10)

## 2018-12-24 NOTE — ED Notes (Signed)
Pt. was ambulated with no difficulties. Pt reports pain on the right side of her neck, and leg.

## 2018-12-24 NOTE — ED Provider Notes (Signed)
Patient seen/examined in the Emergency Department in conjunction with Midlevel Provider [place] Patient presents for seizure Exam : On My exam patient is somnolent easily arousable.  No acute distress noted Plan: CT head negative.  We will continue to monitor until patient is fully awake.  She can be discharged follow-up with neurology Driving restrictions will be given to patient   Ripley Fraise, MD 12/24/18 0130
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
## 2018-12-24 NOTE — Discharge Instructions (Addendum)
Please be aware you may have another seizure ° °Do not drive until seen by your physician for your condition ° °Do not climb ladders/roofs/trees as a seizure can occur at that height and cause serious harm ° °Do not bathe/swim alone as a seizure can occur and cause serious harm ° °Please followup with your physician or neurologist for further testing and possible treatment ° ° °

## 2019-02-18 ENCOUNTER — Encounter (HOSPITAL_COMMUNITY): Payer: Self-pay

## 2019-02-18 ENCOUNTER — Emergency Department (HOSPITAL_COMMUNITY)
Admission: EM | Admit: 2019-02-18 | Discharge: 2019-02-18 | Disposition: A | Discharge status: Home / Self Care | Payer: Medicaid Other | Attending: Emergency Medicine | Admitting: Emergency Medicine

## 2019-02-18 ENCOUNTER — Other Ambulatory Visit: Payer: Self-pay

## 2019-02-18 DIAGNOSIS — M549 Dorsalgia, unspecified: Secondary | ICD-10-CM | POA: Diagnosis not present

## 2019-02-18 DIAGNOSIS — Z5321 Procedure and treatment not carried out due to patient leaving prior to being seen by health care provider: Secondary | ICD-10-CM | POA: Diagnosis not present

## 2019-02-18 NOTE — ED Triage Notes (Signed)
Pt reporting R sided back pain of unknown origin. Ongoing for "a couple of days".

## 2019-02-18 NOTE — ED Provider Notes (Signed)
Patient eloped from the emergency department before provider evaluation. I did not see or evaluate this patient. Triage note states right-sided back pain of unknown origin ongoing for couple of days.    Jacqueline Clarke, [name] 02/18/19 0549    Ward, Layla Maw, DO 02/18/19 [phone]

## 2019-07-13 ENCOUNTER — Encounter (HOSPITAL_COMMUNITY): Payer: Self-pay

## 2019-07-13 ENCOUNTER — Emergency Department (HOSPITAL_COMMUNITY)
Admission: EM | Admit: 2019-07-13 | Discharge: 2019-07-13 | Disposition: A | Discharge status: Home / Self Care | Payer: Medicaid Other | Attending: Emergency Medicine | Admitting: Emergency Medicine

## 2019-07-13 DIAGNOSIS — M25551 Pain in right hip: Secondary | ICD-10-CM | POA: Diagnosis not present

## 2019-07-13 DIAGNOSIS — Z5321 Procedure and treatment not carried out due to patient leaving prior to being seen by health care provider: Secondary | ICD-10-CM | POA: Insufficient documentation

## 2019-07-13 NOTE — ED Notes (Signed)
Called for vitals x3, no answer. 

## 2019-07-13 NOTE — ED Triage Notes (Signed)
Pt reports that for the past two days she has been having R hip pain that radiates down her R leg. Denies injury

## 2019-08-10 ENCOUNTER — Encounter (HOSPITAL_COMMUNITY): Payer: Self-pay

## 2019-08-10 ENCOUNTER — Emergency Department (HOSPITAL_COMMUNITY): Payer: Medicaid Other

## 2019-08-10 ENCOUNTER — Inpatient Hospital Stay (HOSPITAL_COMMUNITY)
Admission: EM | Admit: 2019-08-10 | Discharge: 2019-08-15 | DRG: 871 | Disposition: A | Discharge status: Home / Self Care | Payer: Medicaid Other | Attending: Internal Medicine | Admitting: Internal Medicine

## 2019-08-10 ENCOUNTER — Other Ambulatory Visit: Payer: Self-pay

## 2019-08-10 DIAGNOSIS — R0902 Hypoxemia: Secondary | ICD-10-CM | POA: Diagnosis not present

## 2019-08-10 DIAGNOSIS — R109 Unspecified abdominal pain: Secondary | ICD-10-CM | POA: Diagnosis present

## 2019-08-10 DIAGNOSIS — R823 Hemoglobinuria: Secondary | ICD-10-CM | POA: Diagnosis present

## 2019-08-10 DIAGNOSIS — A549 Gonococcal infection, unspecified: Secondary | ICD-10-CM | POA: Diagnosis present

## 2019-08-10 DIAGNOSIS — R1011 Right upper quadrant pain: Secondary | ICD-10-CM

## 2019-08-10 DIAGNOSIS — M549 Dorsalgia, unspecified: Secondary | ICD-10-CM | POA: Diagnosis present

## 2019-08-10 DIAGNOSIS — Z833 Family history of diabetes mellitus: Secondary | ICD-10-CM | POA: Diagnosis not present

## 2019-08-10 DIAGNOSIS — Z8249 Family history of ischemic heart disease and other diseases of the circulatory system: Secondary | ICD-10-CM

## 2019-08-10 DIAGNOSIS — R809 Proteinuria, unspecified: Secondary | ICD-10-CM | POA: Diagnosis present

## 2019-08-10 DIAGNOSIS — J189 Pneumonia, unspecified organism: Secondary | ICD-10-CM | POA: Diagnosis not present

## 2019-08-10 DIAGNOSIS — R05 Cough: Secondary | ICD-10-CM

## 2019-08-10 DIAGNOSIS — M545 Low back pain: Secondary | ICD-10-CM | POA: Diagnosis not present

## 2019-08-10 DIAGNOSIS — E860 Dehydration: Secondary | ICD-10-CM | POA: Diagnosis present

## 2019-08-10 DIAGNOSIS — Z9049 Acquired absence of other specified parts of digestive tract: Secondary | ICD-10-CM

## 2019-08-10 DIAGNOSIS — R7881 Bacteremia: Secondary | ICD-10-CM

## 2019-08-10 DIAGNOSIS — I10 Essential (primary) hypertension: Secondary | ICD-10-CM | POA: Diagnosis present

## 2019-08-10 DIAGNOSIS — R059 Cough, unspecified: Secondary | ICD-10-CM

## 2019-08-10 DIAGNOSIS — A4151 Sepsis due to Escherichia coli [E. coli]: Secondary | ICD-10-CM | POA: Diagnosis present

## 2019-08-10 DIAGNOSIS — D649 Anemia, unspecified: Secondary | ICD-10-CM | POA: Diagnosis not present

## 2019-08-10 DIAGNOSIS — E669 Obesity, unspecified: Secondary | ICD-10-CM | POA: Diagnosis present

## 2019-08-10 DIAGNOSIS — Z87891 Personal history of nicotine dependence: Secondary | ICD-10-CM | POA: Diagnosis not present

## 2019-08-10 DIAGNOSIS — Z8614 Personal history of Methicillin resistant Staphylococcus aureus infection: Secondary | ICD-10-CM

## 2019-08-10 DIAGNOSIS — A419 Sepsis, unspecified organism: Secondary | ICD-10-CM | POA: Diagnosis not present

## 2019-08-10 DIAGNOSIS — R918 Other nonspecific abnormal finding of lung field: Secondary | ICD-10-CM | POA: Diagnosis not present

## 2019-08-10 DIAGNOSIS — M419 Scoliosis, unspecified: Secondary | ICD-10-CM | POA: Diagnosis present

## 2019-08-10 DIAGNOSIS — K8592 Acute pancreatitis with infected necrosis, unspecified: Secondary | ICD-10-CM | POA: Diagnosis present

## 2019-08-10 DIAGNOSIS — B370 Candidal stomatitis: Secondary | ICD-10-CM | POA: Diagnosis not present

## 2019-08-10 DIAGNOSIS — G8929 Other chronic pain: Secondary | ICD-10-CM | POA: Diagnosis present

## 2019-08-10 DIAGNOSIS — Z20822 Contact with and (suspected) exposure to covid-19: Secondary | ICD-10-CM | POA: Diagnosis present

## 2019-08-10 DIAGNOSIS — Z6839 Body mass index (BMI) 39.0-39.9, adult: Secondary | ICD-10-CM

## 2019-08-10 DIAGNOSIS — Z79899 Other long term (current) drug therapy: Secondary | ICD-10-CM

## 2019-08-10 DIAGNOSIS — M542 Cervicalgia: Secondary | ICD-10-CM | POA: Diagnosis not present

## 2019-08-10 DIAGNOSIS — Z8719 Personal history of other diseases of the digestive system: Secondary | ICD-10-CM

## 2019-08-10 LAB — WET PREP, GENITAL
Sperm: NONE SEEN
Trich, Wet Prep: NONE SEEN
Yeast Wet Prep HPF POC: NONE SEEN

## 2019-08-10 LAB — CBC WITH DIFFERENTIAL/PLATELET
Abs Immature Granulocytes: 0.03 10*3/uL (ref 0.00–0.07)
Basophils Absolute: 0 10*3/uL (ref 0.0–0.1)
Basophils Relative: 0 %
Eosinophils Absolute: 0 10*3/uL (ref 0.0–0.5)
Eosinophils Relative: 0 %
HCT: 36.2 % (ref 36.0–46.0)
Hemoglobin: 11.5 g/dL — ABNORMAL LOW (ref 12.0–15.0)
Immature Granulocytes: 0 %
Lymphocytes Relative: 13 %
Lymphs Abs: 1.2 10*3/uL (ref 0.7–4.0)
MCH: 25.6 pg — ABNORMAL LOW (ref 26.0–34.0)
MCHC: 31.8 g/dL (ref 30.0–36.0)
MCV: 80.6 fL (ref 80.0–100.0)
Monocytes Absolute: 1 10*3/uL (ref 0.1–1.0)
Monocytes Relative: 10 %
Neutro Abs: 7 10*3/uL (ref 1.7–7.7)
Neutrophils Relative %: 77 %
Platelets: 190 10*3/uL (ref 150–400)
RBC: 4.49 MIL/uL (ref 3.87–5.11)
RDW: 15.5 % (ref 11.5–15.5)
WBC: 9.2 10*3/uL (ref 4.0–10.5)
nRBC: 0 % (ref 0.0–0.2)

## 2019-08-10 LAB — COMPREHENSIVE METABOLIC PANEL
ALT: 28 U/L (ref 0–44)
AST: 20 U/L (ref 15–41)
Albumin: 3.5 g/dL (ref 3.5–5.0)
Alkaline Phosphatase: 119 U/L (ref 38–126)
Anion gap: 10 (ref 5–15)
BUN: 10 mg/dL (ref 6–20)
CO2: 26 mmol/L (ref 22–32)
Calcium: 8.2 mg/dL — ABNORMAL LOW (ref 8.9–10.3)
Chloride: 101 mmol/L (ref 98–111)
Creatinine, Ser: 1.1 mg/dL — ABNORMAL HIGH (ref 0.44–1.00)
GFR calc Af Amer: >60 mL/min (ref >60)
GFR calc non Af Amer: >60 mL/min (ref >60)
Glucose, Bld: 98 mg/dL (ref 70–99)
Potassium: 3.9 mmol/L (ref 3.5–5.1)
Sodium: 137 mmol/L (ref 135–145)
Total Bilirubin: 1.1 mg/dL (ref 0.3–1.2)
Total Protein: 7.5 g/dL (ref 6.5–8.1)

## 2019-08-10 LAB — I-STAT BETA HCG BLOOD, ED (MC, WL, AP ONLY): I-stat hCG, quantitative: <5 m[IU]/mL (ref <5)

## 2019-08-10 LAB — URINALYSIS, ROUTINE W REFLEX MICROSCOPIC
Bilirubin Urine: NEGATIVE
Glucose, UA: NEGATIVE mg/dL
Ketones, ur: 5 mg/dL — AB
Nitrite: NEGATIVE
Protein, ur: 30 mg/dL — AB
RBC / HPF: >50 RBC/hpf — ABNORMAL HIGH (ref 0–5)
Specific Gravity, Urine: 1.012 (ref 1.005–1.030)
pH: 9 — ABNORMAL HIGH (ref 5.0–8.0)

## 2019-08-10 LAB — PROTIME-INR
INR: 1.1 (ref 0.8–1.2)
Prothrombin Time: 14.1 seconds (ref 11.4–15.2)

## 2019-08-10 LAB — LACTIC ACID, PLASMA: Lactic Acid, Venous: 1 mmol/L (ref 0.5–1.9)

## 2019-08-10 LAB — SARS CORONAVIRUS 2 BY RT PCR (HOSPITAL ORDER, PERFORMED IN ~~LOC~~ HOSPITAL LAB): SARS Coronavirus 2: NEGATIVE

## 2019-08-10 LAB — APTT: aPTT: 37 seconds — ABNORMAL HIGH (ref 24–36)

## 2019-08-10 MED ORDER — SODIUM CHLORIDE 0.9 % IV SOLN
2.0000 g | Freq: Once | INTRAVENOUS | Status: AC
  Administered 2019-08-10: 2 g via INTRAVENOUS
  Filled 2019-08-10: qty 20

## 2019-08-10 MED ORDER — ACETAMINOPHEN 650 MG RE SUPP: 650.0000 mg | Freq: Four times a day (QID) | RECTAL | Status: DC | PRN

## 2019-08-10 MED ORDER — ONDANSETRON HCL 4 MG PO TABS
4.0000 mg | ORAL_TABLET | Freq: Four times a day (QID) | ORAL | Status: DC | PRN
  Administered 2019-08-13: 4 mg via ORAL
  Filled 2019-08-10: qty 1

## 2019-08-10 MED ORDER — ONDANSETRON HCL 4 MG/2ML IJ SOLN
4.0000 mg | Freq: Four times a day (QID) | INTRAMUSCULAR | Status: DC | PRN
  Administered 2019-08-11 – 2019-08-12 (×2): 4 mg via INTRAVENOUS
  Filled 2019-08-10 (×2): qty 2

## 2019-08-10 MED ORDER — ACETAMINOPHEN 325 MG PO TABS
650.0000 mg | ORAL_TABLET | Freq: Once | ORAL | Status: AC | PRN
  Administered 2019-08-10: 650 mg via ORAL
  Filled 2019-08-10: qty 2

## 2019-08-10 MED ORDER — ACETAMINOPHEN 325 MG PO TABS
650.0000 mg | ORAL_TABLET | Freq: Four times a day (QID) | ORAL | Status: DC | PRN
  Administered 2019-08-11 – 2019-08-12 (×3): 650 mg via ORAL
  Filled 2019-08-10 (×5): qty 2

## 2019-08-10 MED ORDER — SODIUM CHLORIDE 0.9 % IV SOLN
INTRAVENOUS | Status: DC
  Administered 2019-08-11: 125 mL/h via INTRAVENOUS

## 2019-08-10 MED ORDER — SODIUM CHLORIDE 0.9 % IV SOLN
2.0000 g | INTRAVENOUS | Status: DC
  Administered 2019-08-11 – 2019-08-15 (×4): 2 g via INTRAVENOUS
  Filled 2019-08-10: qty 2
  Filled 2019-08-10 (×2): qty 20
  Filled 2019-08-10 (×2): qty 2

## 2019-08-10 MED ORDER — ACETAMINOPHEN 325 MG PO TABS
650.0000 mg | ORAL_TABLET | Freq: Once | ORAL | Status: AC
  Administered 2019-08-11: 650 mg via ORAL
  Filled 2019-08-10: qty 2

## 2019-08-10 MED ORDER — SODIUM CHLORIDE 0.9 % IV BOLUS (SEPSIS)
1000.0000 mL | Freq: Once | INTRAVENOUS | Status: AC
  Administered 2019-08-10: 1000 mL via INTRAVENOUS

## 2019-08-10 MED ORDER — METRONIDAZOLE IN NACL 5-0.79 MG/ML-% IV SOLN
500.0000 mg | Freq: Three times a day (TID) | INTRAVENOUS | Status: DC
  Administered 2019-08-11 – 2019-08-13 (×7): 500 mg via INTRAVENOUS
  Filled 2019-08-10 (×7): qty 100

## 2019-08-10 MED ORDER — METRONIDAZOLE IN NACL 5-0.79 MG/ML-% IV SOLN
500.0000 mg | Freq: Once | INTRAVENOUS | Status: AC
  Administered 2019-08-10: 500 mg via INTRAVENOUS
  Filled 2019-08-10: qty 100

## 2019-08-10 MED ORDER — SODIUM CHLORIDE 0.9 % IV BOLUS (SEPSIS)
200.0000 mL | Freq: Once | INTRAVENOUS | Status: AC
  Administered 2019-08-10: 200 mL via INTRAVENOUS

## 2019-08-10 MED ORDER — FENTANYL CITRATE (PF) 100 MCG/2ML IJ SOLN
50.0000 ug | Freq: Once | INTRAMUSCULAR | Status: AC
  Administered 2019-08-10: 50 ug via INTRAVENOUS
  Filled 2019-08-10: qty 2

## 2019-08-10 MED ORDER — IOHEXOL 300 MG/ML  SOLN
100.0000 mL | Freq: Once | INTRAMUSCULAR | Status: AC | PRN
  Administered 2019-08-10: 100 mL via INTRAVENOUS

## 2019-08-10 MED ORDER — LACTATED RINGERS IV BOLUS (SEPSIS)
1000.0000 mL | Freq: Once | INTRAVENOUS | Status: AC
  Administered 2019-08-11: 1000 mL via INTRAVENOUS

## 2019-08-10 MED ORDER — ENOXAPARIN SODIUM 60 MG/0.6ML ~~LOC~~ SOLN
60.0000 mg | SUBCUTANEOUS | Status: DC
  Administered 2019-08-12 – 2019-08-15 (×4): 60 mg via SUBCUTANEOUS
  Filled 2019-08-10 (×5): qty 0.6

## 2019-08-10 MED ORDER — HYDROMORPHONE HCL 1 MG/ML IJ SOLN
1.0000 mg | Freq: Once | INTRAMUSCULAR | Status: AC
  Administered 2019-08-11: 1 mg via INTRAVENOUS
  Filled 2019-08-10: qty 1

## 2019-08-10 NOTE — H&P (Signed)
History and Physical    Jacqueline Clarke UJW:[id number] DOB: 01-19-1986 DOA: 08/10/2019  PCP: [facility]   Patient coming from: Home.  I have personally briefly reviewed patient's old medical records in [facility]  Chief Complaint: RLQ pain, nausea and vomiting.  HPI: Jacqueline Clarke is a 34 y.o. female with medical history significant of anemia, anxiety, asthma, bipolar disorder, cholelithiasis, chronic back pain/scoliosis, depression, history of headaches, history of cocaine use, history of ileus after C-section, hyperemesis of pregnancy, history of MRSA infection, vitamin D deficiency, class II obesity, history of postpartum pyelonephritis who is coming to the emergency department due to right flank pain radiated to her RUQ and RLQ associated with decreased appetite, nausea, vomiting, fever and chills for the past 2 days.  She denies dysuria, frequency, vaginal discharge or bleeding.  She denies dyspnea, chest pain, palpitations, diaphoresis, PND, orthopnea or pitting edema of the lower extremities.  No diarrhea, constipation, melena or hematochezia.  Denies polyuria, polydipsia, polyphagia or blurred vision.  ED Course: Initial vital signs were temperature 102.9 F, pulse 124, respiration 18, blood pressure 126/85 mmHg and O2 sat 98% on room air.  The patient received 2200 mL of NS bolus, oral acetaminophen, hydromorphone 1 mg IVP, Rocephin 2 g IVPB and Flagyl 500 mg IVPB.  I added 1000 mL LR bolus and Toradol 30 mg IVP x1 dose.  Urinalysis was hazy in appearance, had an elevated pH of more than 9.0, moderate hemoglobinuria, ketonuria 05 and proteinuria 30 mg/dL, large leukocyte esterase, RBC more than 50, WBC 21-50 and rare bacteria. Wet prep was positive for clue cells and WBC.  SARS coronavirus 2 PCR was negative.  PT was 14.1, INR 1.1 and PTT 37.  Her CBC had a white count of 9.2 with 77% neutrophils, 13% lymphocytes and 10% monocytes.  Hemoglobin 11.5 g/dL  and platelets 562.  Lactic acid was normal.  CMP showed a creatinine of 1.10  and calcium of 8.2 mg/dL.  All other CMP values were normal, including her albumin level, which was 3.5 g/dL.  Imaging: 2 view chest radiograph did not show any active cardiopulmonary disease.  CT abdomen pelvis with contrast showed normal-appearing appendix, and no acute abnormality seen to explain the patient's symptoms.  She has splenic pseudocysts versus cysts.  Please see images and full radiology report for further detail  Review of Systems: As per HPI otherwise all other systems reviewed and are negative.  Past Medical History:  Diagnosis Date  . Anemia   . Anxiety   . Asthma   . Bipolar disorder (HCC)   . Blood type, Rh negative   . Cholelithiasis   . Chronic back pain    scoliosis- on percocet  . Depression   . Headache(784.0)   . History of cocaine use   . History of ileus 07/06/2016   After cesarean section  . Hyperemesis complicating pregnancy, antepartum 2013  . 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 Low vitamin D level 01/03/2016  . Obesity   . Pregnancy induced hypertension   . Pyelonephritis 07/03/2016   postpartum  . Scoliosis   . Scoliosis   . Supervision of high risk pregnancy, antepartum 10/07/2017    Nursing Staff Provider Office Location  [facility] Dating   Language  English Anatomy US   Flu Vaccine  Declined 02/02/2018 Genetic Screen  NIPS:   AFP:   First Screen:  Quad:   TDaP vaccine   declined  02/02/2018 Hgb  A1C or  GTT Early  Third trimester  Rhogam   02/02/2018   LAB RESULTS  Feeding Plan Breast/Bottle Blood Type O/Negative/-- (09/18 1525) ONeg Contraception BTL? Antibody Negative (09/18   Past Surgical History:  Procedure Laterality Date  . arm surgery    . CESAREAN SECTION N/A 06/23/2016   Procedure: CESAREAN SECTION;  Surgeon: [place] Bing, MD;  Location: [facility] BIRTHING SUITES;  Service: Obstetrics;  Laterality: N/A;  . CHOLECYSTECTOMY    . DILATION AND CURETTAGE OF  UTERUS  2008  . MR WRIST RIGHT     sx to nerves  . NERVE, TENDON AND ARTERY REPAIR Right 06/18/2014   Procedure: NERVE, TENDON AND ARTERY REPAIR;  Surgeon: Bradly Bienenstock, MD;  Location: [facility] OR;  Service: Orthopedics;  Laterality: Right;  . TONSILLECTOMY AND ADENOIDECTOMY    . TUBAL LIGATION N/A 04/01/2018   Procedure: POST PARTUM TUBAL LIGATION;  Surgeon: Tereso Newcomer, MD;  Location: [facility] LD ORS;  Service: Gynecology;  Laterality: N/A;  . VAGINAL DELIVERY  06/23/2016   Procedure: VAGINAL DELIVERY;  Surgeon: [place] Bing, MD;  Location: [facility] BIRTHING SUITES;  Service: Obstetrics;;   Social History  reports that she quit smoking about 2 years ago. Her smoking use included cigarettes. She smoked 0.25 packs per day. She has never used smokeless tobacco. She reports previous alcohol use. She reports previous drug use. Drug: Cocaine.  No Known Allergies  Family History  Problem Relation Age of Onset  . Heart disease Maternal Grandfather   . Diabetes Maternal Grandfather   . Heart disease Father   . Anesthesia problems Neg Hx    Prior to Admission medications   Medication Sig Start Date End Date Taking? Authorizing Provider  cyclobenzaprine (FLEXERIL) 10 MG tablet Take 1 tablet (10 mg total) by mouth 2 (two) times daily as needed for muscle spasms. 09/12/18  Yes Milagros Loll, MD  gabapentin (NEURONTIN) 800 MG tablet Take 800 mg by mouth 3 (three) times daily. 08/04/19  Yes [provider]  hydrochlorothiazide (HYDRODIURIL) 50 MG tablet Take 50 mg by mouth daily. 08/04/19  Yes [provider]  ketorolac (TORADOL) 10 MG tablet Take 1 tablet (10 mg total) by mouth every 6 (six) hours as needed. Patient taking differently: Take 10 mg by mouth every 6 (six) hours as needed for moderate pain.  05/05/18  Yes Weinhold, Lelon Mast C, CNM  methocarbamol (ROBAXIN) 500 MG tablet Take 1 tablet (500 mg total) by mouth 2 (two) times daily. Patient taking differently: Take 500 mg by mouth  every 6 (six) hours as needed for muscle spasms.  09/02/18  Yes Fayrene Helper, PA-C  Oxycodone HCl 10 MG TABS Take 10 mg by mouth 4 (four) times daily as needed (pain).  07/08/19  Yes [provider]  oxyCODONE-acetaminophen (PERCOCET) 10-325 MG tablet Take 1 tablet by mouth 4 (four) times daily as needed for pain.  08/05/19  Yes [provider]  amLODipine (NORVASC) 10 MG tablet Take 1 tablet (10 mg total) by mouth daily. Patient not taking: Reported on 08/10/2019 04/08/18   Conan Bowens, MD  gabapentin (NEURONTIN) 600 MG tablet Take 600 mg by mouth 3 (three) times daily. Patient not taking: Reported on 08/10/2019 07/08/19   [provider]  ibuprofen (ADVIL) 600 MG tablet Take 1 tablet (600 mg total) by mouth every 6 (six) hours as needed. Patient not taking: Reported on 08/10/2019 09/02/18   Fayrene Helper, PA-C  OZEMPIC, 0.25 OR 0.5 MG/DOSE, 2 MG/1.5ML SOPN Inject into the  skin. Patient not taking: Reported on 08/10/2019 08/05/19   [provider]  phentermine 15 MG capsule Take 15 mg by mouth daily. Patient not taking: Reported on 08/10/2019 08/04/19   [provider]  predniSONE (DELTASONE) 20 MG tablet 3 tabs po day one, then 2 tabs daily x 4 days Patient not taking: Reported on 08/10/2019 09/02/18   Fayrene Helperran, [place], PA-C  Prenat-Fe Poly-Methfol-FA-DHA (VITAFOL ULTRA) 29-0.6-0.4-200 MG CAPS Take 1 capsule by mouth daily before breakfast. Patient not taking: Reported on 08/10/2019 02/02/18   Brock BadHarper, Charles A, MD  senna-docusate (SENOKOT-S) 8.6-50 MG tablet Take 2 tablets by mouth daily. Patient not taking: Reported on 08/10/2019 04/03/18   Orpah CobbMullis, Kiersten P, DO  sertraline (ZOLOFT) 50 MG tablet Take 1 tablet (50 mg total) by mouth daily. Patient not taking: Reported on 08/10/2019 04/03/18   Joana ReamerMullis, Kiersten P, DO   Physical Exam: Vitals:   08/10/19 2018 08/10/19 2113 08/10/19 2114  BP: 126/85  129/85  Pulse: (!) 124  (!) 108  Resp: 18  16  Temp: (!) 102.9 F (39.4  C) (!) 102.9 F (39.4 C)   TempSrc: Oral Oral   SpO2: 98%  97%  Weight: 127 kg    Height: 5\' 11"  (1.803 m)     Constitutional: Looks acutely ill, but now afebrile. Eyes: PERRL, lids and conjunctivae injected. ENMT: Mucous membranes are mildly dry. Posterior pharynx clear of any exudate or lesions. Neck: normal, supple, no masses, no thyromegaly Respiratory: clear to auscultation bilaterally, no wheezing, no crackles. Normal respiratory effort. No accessory muscle use.  Cardiovascular: Regular rate and rhythm (tachycardia resolved after IVF), no murmurs / rubs / gallops. No extremity edema. 2+ pedal pulses. No carotid bruits.  Abdomen: Obese, nondistended.  BS positive.  Soft, positive right CVA tenderness, mild reflected tenderness RUQ and RLQ, no guarding or rebound, no masses palpated. No hepatosplenomegaly. Bowel sounds positive.  Musculoskeletal: no clubbing / cyanosis.  Good ROM, no contractures. Normal muscle tone.  Skin: no rashes, lesions, ulcers on limited dermatological examination. Neurologic: CN 2-12 grossly intact. Sensation intact, DTR normal. Strength 5/5 in all 4.  Psychiatric: Normal judgment and insight. Alert and oriented x 3. Normal mood.   Labs on Admission: I have personally reviewed following labs and imaging studies  CBC: Recent Labs  Lab 08/10/19 2027  WBC 9.2  NEUTROABS 7.0  HGB 11.5*  HCT 36.2  MCV 80.6  PLT 190    Basic Metabolic Panel: Recent Labs  Lab 08/10/19 2027  NA 137  K 3.9  CL 101  CO2 26  GLUCOSE 98  BUN 10  CREATININE 1.10*  CALCIUM 8.2*    GFR: Estimated Creatinine Clearance: 108.1 mL/min (A) (by C-G formula based on SCr of 1.1 mg/dL (H)).  Liver Function Tests: Recent Labs  Lab 08/10/19 2027  AST 20  ALT 28  ALKPHOS 119  BILITOT 1.1  PROT 7.5  ALBUMIN 3.5    Urine analysis:    Component Value Date/Time   COLORURINE YELLOW 08/10/2019 2027   APPEARANCEUR HAZY (A) 08/10/2019 2027   LABSPEC 1.012 08/10/2019 2027    PHURINE 9.0 (H) 08/10/2019 2027   GLUCOSEU NEGATIVE 08/10/2019 2027   HGBUR MODERATE (A) 08/10/2019 2027   BILIRUBINUR NEGATIVE 08/10/2019 2027   BILIRUBINUR negative 12/26/2015 1628   KETONESUR 5 (A) 08/10/2019 2027   PROTEINUR 30 (A) 08/10/2019 2027   UROBILINOGEN 0.2 11/09/2016 2114   NITRITE NEGATIVE 08/10/2019 2027   LEUKOCYTESUR LARGE (A) 08/10/2019 2027    Radiological Exams  on Admission: DG Chest 2 View  Result Date: 08/10/2019 CLINICAL DATA:  Infection EXAM: CHEST - 2 VIEW COMPARISON:  07/03/2016 FINDINGS: The heart size and mediastinal contours are within normal limits. Both lungs are clear. The visualized skeletal structures are unremarkable. IMPRESSION: No active cardiopulmonary disease. Electronically Signed   By: Helyn Numbers MD   On: 08/10/2019 22:05   CT ABDOMEN PELVIS W CONTRAST  Result Date: 08/10/2019 CLINICAL DATA:  Right lower quadrant abdominal pain over the last 2 days. EXAM: CT ABDOMEN AND PELVIS WITH CONTRAST TECHNIQUE: Multidetector CT imaging of the abdomen and pelvis was performed using the standard protocol following bolus administration of intravenous contrast. CONTRAST:  OMNIPAQUE IOHEXOL 300 MG/ML  SOLN COMPARISON:  04/04/2018 FINDINGS: Lower chest: Normal Hepatobiliary: Normal appearance of the liver parenchyma. Previous cholecystectomy. Pancreas: Normal Spleen: 2.8 cm cyst of the spleen, enlarged from 10 mm in March of 2020. Adrenals/Urinary Tract: Adrenal glands are normal. Kidneys are normal. No cyst, mass, stone or hydronephrosis. Bladder is normal. Stomach/Bowel: Stomach and small intestine are normal. Appendix as well seen as a normal structure in a retrocecal location. No acute colon pathology. Moderate amount of fecal matter. Vascular/Lymphatic: No vascular pathology. No retroperitoneal adenopathy. Reproductive: Uterus appears normal. Bilateral fallopian tube clips. No evidence of adnexal mass. Other: No free fluid or air. Previous Caesarean  section scar. No evidence of hernia. 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 IMPRESSION: Normal appearing appendix. No abnormality seen to explain right lower quadrant pain. Moderate amount of fecal matter, but within the range of normal. Bilateral tubal clips, appearance unremarkable. 2.8 cm splenic cyst or pseudocyst, enlarged from 1 cm previously. Electronically Signed   By: Paulina Fusi M.D.   On: 08/10/2019 21:50   EKG: Independently reviewed. Vent. rate 110 BPM PR interval * ms QRS duration 88 ms QT/QTc 306/414 ms P-R-T axes 60 57 -16 Sinus tachycardia Paired ventricular premature complexes Aberrant complex Borderline T abnormalities, inferior leads  Assessment/Plan Principal Problem:   Sepsis due to undetermined organism POA (HCC)     Sepsis secondary to UTI POA (HCC) Admit to telemetry/inpatient. Continue IV fluids. Analgesics as needed. Antiemetics as needed. Continue ceftriaxone 2 g IVPB every 24 hours. Continue metronidazole 500 mg IVPB every 8 hours. Follow-up urine culture and sensitivity. Follow-up blood culture and sensitivity.  Active Problems:   Normocytic anemia Monitor H&H.    Chronic back pain Continue Flexeril 10 mg twice daily as needed. Continue Percocet 10/325 mg p.o. every 4 hours as needed    Hypertension Resume HCTZ, but at a lower dose. Monitor BP, renal function electrolytes.    Hypocalcemia History of vitamin D deficiency. Check vitamin D level in a.m. Monitor calcium level.   DVT prophylaxis: Lovenox SQ. Code Status:   Full code. Family Communication:   Disposition Plan:   Patient is from:  Home.  Anticipated DC to:  Home.  Anticipated DC date:  08/12/2019.  Anticipated DC barriers: Clinical status.  Consults called: Admission status:  Inpatient/telemetry.   Severity of Illness: High.  Bobette Mo MD [facility]  How to contact the [facility] Attending or Consulting provider 7A - 7P or covering provider  during after hours 7P -7A, for this patient?   1. Check the care team in [facility] and look for a) attending/consulting [facility] provider listed and b) the [facility] team listed 2. Log into [email or web address] and use [place]'s universal password to access. If you do not have the password, please contact the hospital operator. 3. Locate  the [facility] provider you are looking for under [facility] and page to a number that you can be directly reached. 4. If you still have difficulty reaching the provider, please page the [facility] (Director on Call) for the Hospitalists listed on amion for assistance.  08/10/2019, 11:59 PM   This document was prepared using Dragon voice recognition software and may contain some unintended transcription errors.

## 2019-08-10 NOTE — ED Triage Notes (Signed)
Pt BIB EMS from home. Pt reports progressive RLQ abdominal pain x2 days. Pt still has appendix. Pt reports N/V and denies diarrhea.   4mg  Zofran 500 mL NS 18G LAC HR 120 BP 130/81 RR 22 100% RA CBG 114

## 2019-08-10 NOTE — ED Provider Notes (Signed)
[facility]-EMERGENCY DEPT Provider Note   CSN: [id number] Arrival date & time: 08/10/19  2008     History Chief Complaint  Patient presents with  . Abdominal Pain    Jacqueline Clarke is a 34 y.o. female with past medical history of bipolar disorder, cholelithiasis status post cholecystectomy 2011, asthma that presents to the emergency department today for right lower quadrant pain via EMS.  Patient states that he is she started having pain generalized in her abdomen radiating to her back for the past 2 days, now is in right lower quadrant.  Patient states that she is having fevers at home with chills.  Patient states that she feels nauseous, does not feel nauseous currently.  States that she did vomit today, and yesterday.  States that she has not been able to keep anything down.  Denies any diarrhea.  Denies any pelvic complaints, vaginal discharge, vaginal bleeding, pelvic pain.  Patient states that she was in normal health 2 days ago.  Patient states that she has been taking her oxycodone which has not been providing any relief.  States that she takes oxycodone for scoliosis.  Has gotten Covid vaccines, denies any sick contacts.  Denies any URI-like symptoms, cough, sore throat.  Denies any chest pain, shortness of breath.  HPI     Past Medical History:  Diagnosis Date  . Anemia   . Anxiety   . Asthma   . Bipolar disorder (HCC)   . Blood type, Rh negative   . Cholelithiasis   . Chronic back pain    scoliosis- on percocet  . Depression   . Headache(784.0)   . History of cocaine use   . History of ileus 07/06/2016   After cesarean section  . Hyperemesis complicating pregnancy, antepartum 2013  . 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 Low vitamin D level 01/03/2016  . Obesity   . Pregnancy induced hypertension   . Pyelonephritis 07/03/2016   postpartum  . Scoliosis   . Scoliosis   . Supervision of high risk pregnancy, antepartum 10/07/2017     Nursing Staff Provider Office Location  [facility] Dating   Language  English Anatomy US   Flu Vaccine  Declined 02/02/2018 Genetic Screen  NIPS:   AFP:   First Screen:  Quad:   TDaP vaccine   declined  02/02/2018 Hgb A1C or  GTT Early  Third trimester  Rhogam   02/02/2018   LAB RESULTS  Feeding Plan Breast/Bottle Blood Type O/Negative/-- (09/18 1525) ONeg Contraception BTL? Antibody Negative (09/18    Patient Active Problem List   Diagnosis Date Noted  . Thrombophlebitis 04/07/2018  . Hypertension in pregnancy, preeclampsia, severe, delivered/postpartum 04/05/2018  . Postpartum hypertension 04/05/2018  . VBAC, delivered 03/31/2018  . Request for sterilization 03/31/2018  . H/O: C-section 03/26/2018  . Obesity affecting pregnancy, antepartum 11/06/2017  . Traumatic injury during pregnancy 09/28/2017  . Depression, postpartum 07/09/2016  . Adjustment insomnia 07/09/2016  . History of gestational hypertension 06/30/2016  . Rh negative status during pregnancy   . Morbid obesity (HCC)     Past Surgical History:  Procedure Laterality Date  . arm surgery    . CESAREAN SECTION N/A 06/23/2016   Procedure: CESAREAN SECTION;  Surgeon: [place] Bing, MD;  Location: [facility] BIRTHING SUITES;  Service: Obstetrics;  Laterality: N/A;  . CHOLECYSTECTOMY    . DILATION AND CURETTAGE OF UTERUS  2008  . MR WRIST RIGHT     sx to  nerves  . NERVE, TENDON AND ARTERY REPAIR Right 06/18/2014   Procedure: NERVE, TENDON AND ARTERY REPAIR;  Surgeon: Bradly BienenstockFred Ortmann, MD;  Location: [facility] OR;  Service: Orthopedics;  Laterality: Right;  . TONSILLECTOMY AND ADENOIDECTOMY    . TUBAL LIGATION N/A 04/01/2018   Procedure: POST PARTUM TUBAL LIGATION;  Surgeon: Tereso NewcomerAnyanwu, Ugonna A, MD;  Location: [facility] LD ORS;  Service: Gynecology;  Laterality: N/A;  . VAGINAL DELIVERY  06/23/2016   Procedure: VAGINAL DELIVERY;  Surgeon: [place] BingPickens, Charlie, MD;  [facility];  Service: Obstetrics;;     OB History    Gravida  8   Para   6   Term  5   Preterm  1   AB  2   Living  7     SAB  2   TAB  0   Ectopic  0   Multiple  1   Live Births  7           Family History  Problem Relation Age of Onset  . Heart disease Maternal Grandfather   . Diabetes Maternal Grandfather   . Heart disease Father   . Anesthesia problems Neg Hx     Social History   Tobacco Use  . Smoking status: Former Smoker    Packs/day: 0.25    Types: Cigarettes    Quit date: 08/02/2017    Years since quitting: 2.0  . Smokeless tobacco: Never Used  Vaping Use  . Vaping Use: Never used  Substance Use Topics  . Alcohol use: Not Currently    Alcohol/week: 0.0 standard drinks    Comment: Occassionally  . Drug use: Not Currently    Types: Cocaine    Comment: UDS + 11/22/17    Home Medications Prior to Admission medications   Medication Sig Start Date End Date Taking? Authorizing Provider  cyclobenzaprine (FLEXERIL) 10 MG tablet Take 1 tablet (10 mg total) by mouth 2 (two) times daily as needed for muscle spasms. 09/12/18  Yes Milagros Lollykstra, Richard S, MD  gabapentin (NEURONTIN) 800 MG tablet Take 800 mg by mouth 3 (three) times daily. 08/04/19  Yes [provider]  hydrochlorothiazide (HYDRODIURIL) 50 MG tablet Take 50 mg by mouth daily. 08/04/19  Yes [provider]  ketorolac (TORADOL) 10 MG tablet Take 1 tablet (10 mg total) by mouth every 6 (six) hours as needed. Patient taking differently: Take 10 mg by mouth every 6 (six) hours as needed for moderate pain.  05/05/18  Yes Weinhold, Lelon MastSamantha C, CNM  methocarbamol (ROBAXIN) 500 MG tablet Take 1 tablet (500 mg total) by mouth 2 (two) times daily. Patient taking differently: Take 500 mg by mouth every 6 (six) hours as needed for muscle spasms.  09/02/18  Yes Fayrene Helperran, Bowie, PA-C  Oxycodone HCl 10 MG TABS Take 10 mg by mouth 4 (four) times daily as needed (pain).  07/08/19  Yes [provider]  oxyCODONE-acetaminophen (PERCOCET) 10-325 MG tablet Take 1 tablet by  mouth 4 (four) times daily as needed for pain.  08/05/19  Yes [provider]  amLODipine (NORVASC) 10 MG tablet Take 1 tablet (10 mg total) by mouth daily. Patient not taking: Reported on 08/10/2019 04/08/18   Conan Bowensavis, Kelly M, MD  gabapentin (NEURONTIN) 600 MG tablet Take 600 mg by mouth 3 (three) times daily. Patient not taking: Reported on 08/10/2019 07/08/19   [provider]  ibuprofen (ADVIL) 600 MG tablet Take 1 tablet (600 mg total) by mouth every 6 (six) hours as needed.  Patient not taking: Reported on 08/10/2019 09/02/18   Fayrene Helper, PA-C  OZEMPIC, 0.25 OR 0.5 MG/DOSE, 2 MG/1.5ML SOPN Inject into the skin. Patient not taking: Reported on 08/10/2019 08/05/19   [provider]  phentermine 15 MG capsule Take 15 mg by mouth daily. Patient not taking: Reported on 08/10/2019 08/04/19   [provider]  predniSONE (DELTASONE) 20 MG tablet 3 tabs po day one, then 2 tabs daily x 4 days Patient not taking: Reported on 08/10/2019 09/02/18   Fayrene Helper, PA-C  Prenat-Fe Poly-Methfol-FA-DHA (VITAFOL ULTRA) 29-0.6-0.4-200 MG CAPS Take 1 capsule by mouth daily before breakfast. Patient not taking: Reported on 08/10/2019 02/02/18   Brock Bad, MD  senna-docusate (SENOKOT-S) 8.6-50 MG tablet Take 2 tablets by mouth daily. Patient not taking: Reported on 08/10/2019 04/03/18   Orpah Cobb P, DO  sertraline (ZOLOFT) 50 MG tablet Take 1 tablet (50 mg total) by mouth daily. Patient not taking: Reported on 08/10/2019 04/03/18   Orpah Cobb P, DO    Allergies    Patient has no known allergies.  Review of Systems   Review of Systems  Constitutional: Negative for chills, diaphoresis, fatigue and fever.  HENT: Negative for congestion, sore throat and trouble swallowing.   Eyes: Negative for pain and visual disturbance.  Respiratory: Negative for cough, shortness of breath and wheezing.   Cardiovascular: Negative for chest pain, palpitations and leg swelling.   Gastrointestinal: Positive for abdominal pain. Negative for abdominal distention, anal bleeding, blood in stool, constipation, diarrhea, nausea and vomiting.  Genitourinary: Negative for decreased urine volume, difficulty urinating, frequency, hematuria, menstrual problem, pelvic pain, urgency, vaginal bleeding, vaginal discharge and vaginal pain.  Musculoskeletal: Negative for back pain, neck pain and neck stiffness.  Skin: Negative for pallor.  Neurological: Negative for dizziness, speech difficulty, weakness and headaches.  Psychiatric/Behavioral: Negative for confusion.    Physical Exam Updated Vital Signs BP 129/85   Pulse (!) 108   Temp (!) 102.9 F (39.4 C) (Oral)   Resp 16   Ht  (1.803 m)   Wt 127 kg   SpO2 97%   BMI 39.05 kg/m   Physical Exam Constitutional:      General: She is not in acute distress.    Appearance: Normal appearance. She is not ill-appearing, toxic-appearing or diaphoretic.  HENT:     Head: Normocephalic and atraumatic.     Mouth/Throat:     Mouth: Mucous membranes are moist.     Pharynx: Oropharynx is clear.  Eyes:     General: No scleral icterus.    Extraocular Movements: Extraocular movements intact.     Pupils: Pupils are equal, round, and reactive to light.  Cardiovascular:     Rate and Rhythm: Regular rhythm. Tachycardia present.     Pulses: Normal pulses.     Heart sounds: Normal heart sounds.  Pulmonary:     Effort: Pulmonary effort is normal. No respiratory distress.     Breath sounds: Normal breath sounds. No stridor. No wheezing, rhonchi or rales.  Chest:     Chest wall: No tenderness.  Abdominal:     General: Abdomen is flat. There is no distension.     Palpations: Abdomen is soft. There is no shifting dullness or fluid wave.     Tenderness: There is generalized abdominal tenderness and tenderness in the right lower quadrant. There is guarding. There is no right CVA tenderness, left CVA tenderness or rebound. Positive signs  include obturator sign. Negative signs include Murphy's sign,  Rovsing's sign, McBurney's sign and psoas sign.  Genitourinary:    Comments: .  Chaperone present.  Pelvic exam with moderate amount of blood coming from cervix, patient states that she is on her period that she started today.  Cervix is closed.  No vaginal discharge noticed.  Bimanual exam without any adnexal or cervical tenderness.  Patient tolerated exam well. Musculoskeletal:        General: No swelling or tenderness. Normal range of motion.     Cervical back: Normal range of motion and neck supple. No rigidity.     Right lower leg: No edema.     Left lower leg: No edema.  Skin:    General: Skin is warm and dry.     Capillary Refill: Capillary refill takes less than 2 seconds.     Coloration: Skin is not pale.  Neurological:     General: No focal deficit present.     Mental Status: She is alert and oriented to person, place, and time.  Psychiatric:        Mood and Affect: Mood normal.        Behavior: Behavior normal.     ED Results / Procedures / Treatments   Labs (all labs ordered are listed, but only abnormal results are displayed) Labs Reviewed  WET PREP, GENITAL - Abnormal; Notable for the following components:      Result Value   Clue Cells Wet Prep HPF POC PRESENT (*)    WBC, Wet Prep HPF POC PRESENT (*)    All other components within normal limits  COMPREHENSIVE METABOLIC PANEL - Abnormal; Notable for the following components:   Creatinine, Ser 1.10 (*)    Calcium 8.2 (*)    All other components within normal limits  CBC WITH DIFFERENTIAL/PLATELET - Abnormal; Notable for the following components:   Hemoglobin 11.5 (*)    MCH 25.6 (*)    All other components within normal limits  URINALYSIS, ROUTINE W REFLEX MICROSCOPIC - Abnormal; Notable for the following components:   APPearance HAZY (*)    pH 9.0 (*)    Hgb urine dipstick MODERATE (*)    Ketones, ur 5 (*)    Protein, ur 30 (*)    Leukocytes,Ua  LARGE (*)    RBC / HPF >50 (*)    Bacteria, UA RARE (*)    All other components within normal limits  APTT - Abnormal; Notable for the following components:   aPTT 37 (*)    All other components within normal limits  SARS CORONAVIRUS 2 BY RT PCR (HOSPITAL ORDER, PERFORMED IN [facility])  CULTURE, BLOOD (ROUTINE X 2)  CULTURE, BLOOD (ROUTINE X 2)  URINE CULTURE  LACTIC ACID, PLASMA  PROTIME-INR  I-STAT BETA HCG BLOOD, ED ([facility], [facility], [facility] ONLY)  I-STAT BETA HCG BLOOD, ED ([facility], [facility], [facility] ONLY)  GC/CHLAMYDIA PROBE AMP ([place]) NOT AT [facility]    EKG EKG Interpretation  Date/Time:  Wednesday August 10 2019 20:57:17 EDT Ventricular Rate:  110 PR Interval:    QRS Duration: 88 QT Interval:  306 QTC Calculation: 414 R Axis:   57 Text Interpretation: Sinus tachycardia Paired ventricular premature complexes Aberrant complex Borderline T abnormalities, inferior leads Confirmed by Tilden Fossa [phone]) on 08/10/2019 9:03:17 PM   Radiology DG Chest 2 View  Result Date: 08/10/2019 CLINICAL DATA:  Infection EXAM: CHEST - 2 VIEW COMPARISON:  07/03/2016 FINDINGS: The heart size and mediastinal contours are within normal limits. Both lungs are clear. The  visualized skeletal structures are unremarkable. IMPRESSION: No active cardiopulmonary disease. Electronically Signed   By: Helyn Numbers MD   On: 08/10/2019 22:05   CT ABDOMEN PELVIS W CONTRAST  Result Date: 08/10/2019 CLINICAL DATA:  Right lower quadrant abdominal pain over the last 2 days. EXAM: CT ABDOMEN AND PELVIS WITH CONTRAST TECHNIQUE: Multidetector CT imaging of the abdomen and pelvis was performed using the standard protocol following bolus administration of intravenous contrast. CONTRAST:  OMNIPAQUE IOHEXOL 300 MG/ML  SOLN COMPARISON:  04/04/2018 FINDINGS: Lower chest: Normal Hepatobiliary: Normal appearance of the liver parenchyma. Previous cholecystectomy. Pancreas: Normal Spleen: 2.8 cm cyst of the spleen, enlarged from  10 mm in March of 2020. Adrenals/Urinary Tract: Adrenal glands are normal. Kidneys are normal. No cyst, mass, stone or hydronephrosis. Bladder is normal. Stomach/Bowel: Stomach and small intestine are normal. Appendix as well seen as a normal structure in a retrocecal location. No acute colon pathology. Moderate amount of fecal matter. Vascular/Lymphatic: No vascular pathology. No retroperitoneal adenopathy. Reproductive: Uterus appears normal. Bilateral fallopian tube clips. No evidence of adnexal mass. Other: No free fluid or air. Previous Caesarean section scar. No evidence of hernia. 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 IMPRESSION: Normal appearing appendix. No abnormality seen to explain right lower quadrant pain. Moderate amount of fecal matter, but within the range of normal. Bilateral tubal clips, appearance unremarkable. 2.8 cm splenic cyst or pseudocyst, enlarged from 1 cm previously. Electronically Signed   By: Paulina Fusi M.D.   On: 08/10/2019 21:50    Procedures Procedures (including critical care time)  Medications Ordered in ED Medications  acetaminophen (TYLENOL) tablet 650 mg (has no administration in time range)  HYDROmorphone (DILAUDID) injection 1 mg (has no administration in time range)  acetaminophen (TYLENOL) tablet 650 mg (650 mg Oral Given 08/10/19 2033)  sodium chloride 0.9 % bolus 1,000 mL (0 mLs Intravenous Stopped 08/10/19 2325)    And  sodium chloride 0.9 % bolus 1,000 mL (0 mLs Intravenous Stopped 08/10/19 2325)    And  sodium chloride 0.9 % bolus 200 mL (0 mLs Intravenous Stopped 08/10/19 2146)  fentaNYL (SUBLIMAZE) injection 50 mcg (50 mcg Intravenous Given 08/10/19 2108)  cefTRIAXone (ROCEPHIN) 2 g in sodium chloride 0.9 % 100 mL IVPB (0 g Intravenous Stopped 08/10/19 2146)  metroNIDAZOLE (FLAGYL) IVPB 500 mg (0 mg Intravenous Stopped 08/10/19 2244)  iohexol (OMNIPAQUE) 300 MG/ML solution 100 mL (100 mLs Intravenous Contrast Given 08/10/19 2132)    ED  Course  I have reviewed the triage vital signs and the nursing notes.  Pertinent labs & imaging results that were available during my care of the patient were reviewed by me and considered in my medical decision making (see chart for details).    MDM Rules/Calculators/A&P                         DAVON FOLTA is a 34 y.o. female with past medical history of bipolar disorder, cholelithiasis status post cholecystectomy 2011, asthma that presents the emergency department today for right lower quadrant pain via EMS.  Patient's temperature 102.9 on arrival with tachycardia to 124 and right lower quadrant pain, will initiate sepsis protocol.  No peritoneal signs.  Patient is guarding to right lower quadrant.  Blood cultures drawn, IV fluids and intra-abdominal antibiotics started.  Fentanyl given for pain.  Work-up acutely negative with no leukocytosis.  CBC and CMP acutely normal.  Urinalysis does show UTI.  CT abdomen pelvis without any acute  intra-abdominal abnormalities.  Pelvic exam benign.  Normal lactic.    Upon repeat examination, patient still tachycardic to 115.  Low-grade temperature of 100.4 with Tylenol on board.  Have given fentanyl and Dilaudid, patient is getting fluids.  Will admit at this time for sepsis secondary to UTI. Pt does not have any CVA tenderness.   1147 spoke to hospitalist, Dr. Robb Matar who will accept the patient.  The patient appears reasonably stabilized for admission considering the current resources, flow, and capabilities available in the ED at this time, and I doubt any other [facility] requiring further screening and/or treatment in the ED prior to admission.  I discussed this case with my attending physician who cosigned this note including patient's presenting symptoms, physical exam, and planned diagnostics and interventions. Attending physician stated agreement with plan or made changes to plan which were implemented.   Attending physician assessed patient at  bedside.  Final Clinical Impression(s) / ED Diagnoses Final diagnoses:  Sepsis due to urinary tract infection [facility])    Rx / DC Orders ED Discharge Orders    None       Farrel Gordon, PA-C 08/11/19 0004    Tilden Fossa, MD 08/11/19 1208

## 2019-08-11 ENCOUNTER — Inpatient Hospital Stay (HOSPITAL_COMMUNITY): Payer: Medicaid Other

## 2019-08-11 DIAGNOSIS — G8929 Other chronic pain: Secondary | ICD-10-CM

## 2019-08-11 DIAGNOSIS — I10 Essential (primary) hypertension: Secondary | ICD-10-CM | POA: Diagnosis present

## 2019-08-11 DIAGNOSIS — E669 Obesity, unspecified: Secondary | ICD-10-CM

## 2019-08-11 DIAGNOSIS — R059 Cough, unspecified: Secondary | ICD-10-CM | POA: Clinically undetermined

## 2019-08-11 DIAGNOSIS — D649 Anemia, unspecified: Secondary | ICD-10-CM

## 2019-08-11 DIAGNOSIS — R109 Unspecified abdominal pain: Secondary | ICD-10-CM | POA: Diagnosis present

## 2019-08-11 DIAGNOSIS — R05 Cough: Secondary | ICD-10-CM

## 2019-08-11 DIAGNOSIS — M545 Low back pain: Secondary | ICD-10-CM

## 2019-08-11 LAB — CBC WITH DIFFERENTIAL/PLATELET
Abs Immature Granulocytes: 0.03 10*3/uL (ref 0.00–0.07)
Basophils Absolute: 0 10*3/uL (ref 0.0–0.1)
Basophils Relative: 0 %
Eosinophils Absolute: 0 10*3/uL (ref 0.0–0.5)
Eosinophils Relative: 0 %
HCT: 35.7 % — ABNORMAL LOW (ref 36.0–46.0)
Hemoglobin: 10.6 g/dL — ABNORMAL LOW (ref 12.0–15.0)
Immature Granulocytes: 0 %
Lymphocytes Relative: 15 %
Lymphs Abs: 1 10*3/uL (ref 0.7–4.0)
MCH: 25.5 pg — ABNORMAL LOW (ref 26.0–34.0)
MCHC: 29.7 g/dL — ABNORMAL LOW (ref 30.0–36.0)
MCV: 86 fL (ref 80.0–100.0)
Monocytes Absolute: 0.7 10*3/uL (ref 0.1–1.0)
Monocytes Relative: 10 %
Neutro Abs: 5 10*3/uL (ref 1.7–7.7)
Neutrophils Relative %: 75 %
Platelets: 155 10*3/uL (ref 150–400)
RBC: 4.15 MIL/uL (ref 3.87–5.11)
RDW: 16 % — ABNORMAL HIGH (ref 11.5–15.5)
WBC: 6.7 10*3/uL (ref 4.0–10.5)
nRBC: 0 % (ref 0.0–0.2)

## 2019-08-11 LAB — BASIC METABOLIC PANEL
Anion gap: 10 (ref 5–15)
BUN: 9 mg/dL (ref 6–20)
CO2: 19 mmol/L — ABNORMAL LOW (ref 22–32)
Calcium: 7.9 mg/dL — ABNORMAL LOW (ref 8.9–10.3)
Chloride: 110 mmol/L (ref 98–111)
Creatinine, Ser: 0.79 mg/dL (ref 0.44–1.00)
GFR calc Af Amer: >60 mL/min (ref >60)
GFR calc non Af Amer: >60 mL/min (ref >60)
Glucose, Bld: 88 mg/dL (ref 70–99)
Potassium: 3.7 mmol/L (ref 3.5–5.1)
Sodium: 139 mmol/L (ref 135–145)

## 2019-08-11 LAB — BLOOD CULTURE ID PANEL (REFLEXED)

## 2019-08-11 LAB — HIV ANTIBODY (ROUTINE TESTING W REFLEX): HIV Screen 4th Generation wRfx: NONREACTIVE

## 2019-08-11 LAB — VITAMIN D 25 HYDROXY (VIT D DEFICIENCY, FRACTURES): Vit D, 25-Hydroxy: 18.09 ng/mL — ABNORMAL LOW (ref 30–100)

## 2019-08-11 MED ORDER — GABAPENTIN 400 MG PO CAPS
800.0000 mg | ORAL_CAPSULE | Freq: Three times a day (TID) | ORAL | Status: DC
  Administered 2019-08-11 – 2019-08-15 (×13): 800 mg via ORAL
  Filled 2019-08-11 (×12): qty 2
  Filled 2019-08-11: qty 8

## 2019-08-11 MED ORDER — HYDROMORPHONE HCL 1 MG/ML IJ SOLN
1.0000 mg | Freq: Once | INTRAMUSCULAR | Status: AC
  Administered 2019-08-11: 1 mg via INTRAVENOUS
  Filled 2019-08-11: qty 1

## 2019-08-11 MED ORDER — KETOROLAC TROMETHAMINE 30 MG/ML IJ SOLN
30.0000 mg | Freq: Once | INTRAMUSCULAR | Status: AC
  Administered 2019-08-11: 30 mg via INTRAVENOUS
  Filled 2019-08-11: qty 1

## 2019-08-11 MED ORDER — OXYCODONE-ACETAMINOPHEN 5-325 MG PO TABS
1.0000 | ORAL_TABLET | ORAL | Status: DC | PRN
  Administered 2019-08-11 – 2019-08-14 (×5): 1 via ORAL
  Filled 2019-08-11 (×7): qty 1

## 2019-08-11 MED ORDER — SODIUM CHLORIDE 0.9 % IV BOLUS
500.0000 mL | Freq: Once | INTRAVENOUS | Status: AC
  Administered 2019-08-11: 500 mL via INTRAVENOUS

## 2019-08-11 MED ORDER — HYDROMORPHONE HCL 1 MG/ML IJ SOLN
1.0000 mg | INTRAMUSCULAR | Status: DC | PRN
  Administered 2019-08-11 – 2019-08-14 (×13): 1 mg via INTRAVENOUS
  Filled 2019-08-11 (×14): qty 1

## 2019-08-11 MED ORDER — ONDANSETRON HCL 4 MG/2ML IJ SOLN
4.0000 mg | Freq: Once | INTRAMUSCULAR | Status: AC
  Administered 2019-08-11: 4 mg via INTRAVENOUS
  Filled 2019-08-11: qty 2

## 2019-08-11 MED ORDER — CYCLOBENZAPRINE HCL 10 MG PO TABS
10.0000 mg | ORAL_TABLET | Freq: Two times a day (BID) | ORAL | Status: DC | PRN
  Administered 2019-08-11: 10 mg via ORAL
  Filled 2019-08-11: qty 1

## 2019-08-11 MED ORDER — OXYCODONE-ACETAMINOPHEN 10-325 MG PO TABS: 1.0000 | ORAL_TABLET | ORAL | Status: DC | PRN

## 2019-08-11 MED ORDER — HYDROCHLOROTHIAZIDE 25 MG PO TABS
25.0000 mg | ORAL_TABLET | Freq: Every day | ORAL | Status: DC
  Administered 2019-08-11 – 2019-08-15 (×5): 25 mg via ORAL
  Filled 2019-08-11 (×6): qty 1

## 2019-08-11 MED ORDER — OXYCODONE HCL 5 MG PO TABS
5.0000 mg | ORAL_TABLET | ORAL | Status: DC | PRN
  Administered 2019-08-11 – 2019-08-14 (×6): 5 mg via ORAL
  Filled 2019-08-11 (×8): qty 1

## 2019-08-11 NOTE — Progress Notes (Signed)
PHARMACY - PHYSICIAN COMMUNICATION CRITICAL VALUE ALERT - BLOOD CULTURE IDENTIFICATION (BCID)  Jacqueline Clarke is an 34 y.o. female who presented to [facility] on 08/10/2019 with a chief complaint of RLQ pain, nausea, and vomiting.   Assessment: Sepsis, presumed source UTI but unclear etiology-differential also now includes possible PNA or gallbladder etiology. Wet prep also + for clue cells, WBC.   Name of physician (or Provider) Contacted: X. Blount, NP  Current antibiotics: Ceftriaxone, Metronidazole   Changes to prescribed antibiotics recommended:  Patient is on recommended antibiotics - No changes needed  Results for orders placed or performed during the hospital encounter of 07/03/16  Blood Culture ID Panel (Reflexed) (Collected: 07/03/2016  2:55 PM)  Result Value Ref Range   Enterococcus species NOT DETECTED NOT DETECTED   Listeria monocytogenes NOT DETECTED NOT DETECTED   Staphylococcus species NOT DETECTED NOT DETECTED   Staphylococcus aureus (BCID) NOT DETECTED NOT DETECTED   Streptococcus species NOT DETECTED NOT DETECTED   Streptococcus agalactiae NOT DETECTED NOT DETECTED   Streptococcus pneumoniae NOT DETECTED NOT DETECTED   Streptococcus pyogenes NOT DETECTED NOT DETECTED   Acinetobacter baumannii NOT DETECTED NOT DETECTED   Enterobacteriaceae species DETECTED (A) NOT DETECTED   Enterobacter cloacae complex NOT DETECTED NOT DETECTED   Escherichia coli DETECTED (A) NOT DETECTED   Klebsiella oxytoca NOT DETECTED NOT DETECTED   Klebsiella pneumoniae NOT DETECTED NOT DETECTED   Proteus species NOT DETECTED NOT DETECTED   Serratia marcescens NOT DETECTED NOT DETECTED   Carbapenem resistance NOT DETECTED NOT DETECTED   Haemophilus influenzae NOT DETECTED NOT DETECTED   Neisseria meningitidis NOT DETECTED NOT DETECTED   Pseudomonas aeruginosa NOT DETECTED NOT DETECTED   Candida albicans NOT DETECTED NOT DETECTED   Candida glabrata NOT DETECTED NOT DETECTED    Candida krusei NOT DETECTED NOT DETECTED   Candida parapsilosis NOT DETECTED NOT DETECTED   Candida tropicalis NOT DETECTED NOT DETECTED    Jamse Mead 08/11/2019  7:43 PM

## 2019-08-11 NOTE — Progress Notes (Signed)
Patient is yellow mews upon arrival to unit

## 2019-08-11 NOTE — ED Notes (Signed)
Pt provided breakfast tray.

## 2019-08-11 NOTE — Progress Notes (Signed)
   08/11/19 1348  Assess: MEWS Score  Temp (!) 102 F (38.9 C)  BP (!) 151/93  Pulse Rate 98  Resp 20  SpO2 97 %  Assess: MEWS Score  MEWS Temp 2  MEWS Systolic 0  MEWS Pulse 0  MEWS RR 0  MEWS LOC 0  MEWS Score 2  MEWS Score Color Yellow  Assess: if the MEWS score is Yellow or Red  Were vital signs taken at a resting state? Yes  Focused Assessment  (not yet completed)  Early Detection of Sepsis Score *See Row Information* High  MEWS guidelines implemented *See Row Information* Yes  Treat  MEWS Interventions Administered prn meds/treatments  Pain Scale 0-10  Pain Score 6  Pain Location Abdomen  Pain Orientation Right  Pain Descriptors / Indicators Aching  Pain Frequency Constant  Pain Onset On-going  Pain Intervention(s) Medication (See eMAR)  Multiple Pain Sites No  Take Vital Signs  Increase Vital Sign Frequency  Yellow: Q 2hr X 2 then Q 4hr X 2, if remains yellow, continue Q 4hrs  Escalate  MEWS: Escalate Yellow: discuss with charge nurse/RN and consider discussing with provider and RRT  Notify: Charge Nurse/RN  Name of Charge Nurse/RN Notified Gelene Mink  Date Charge Nurse/RN Notified 08/11/19  Time Charge Nurse/RN Notified 1401  Notify: Provider  Provider Name/Title Dennison Nancy  Date Provider Notified 08/11/19  Time Provider Notified 1401  Notification Type Page  Notification Reason  (yellow mews on arrival to unit)  patient in yellow mews upon arrival to unit.  Dr Caleb Popp aware.  Patient medicated with tylenol for temp.  No new orders obtained.

## 2019-08-11 NOTE — Progress Notes (Signed)
Patient arrives to room 1528 at this time via stretcher from ED.  Patient independent to bed from stretcher.  Siderails are up x3, bed is low and callbell is within reach

## 2019-08-11 NOTE — Progress Notes (Signed)
[facility]  PROGRESS NOTE  Jacqueline Reaperrachelle L Noreen ZOX:[id number] DOB: 03/13/1985 DOA: 08/10/2019 PCP: [facility] Admit date - 08/10/2019   Admitting Physician Bobette Moavid Manuel Ortiz, MD  [facility]patient [facility] the [facility]  LOS - 1 Brief Narrative   Jacqueline Reaperrachelle L Clarke is a 34 y.o. year old female with medical history significant for HTN,  obesity and a remote cholecystectomy (10 years ago) for cholelithiasis , Hyperemesis of pregnancy, vitamin D deficiency, class II obesity who presented with 2 days of worsening right rib cage/flank pain with radiation to the right lower back associated with nausea, vomiting, fevers, chills/rigors, diminished appetite.  Patient was found to be septic with presumed etiology being UTI based off UA findings.  Hospital course: Patient denies any urinary symptoms, now developing cough and persistent right rib cage pain, concerning for potential pneumonia.   Subjective  Today reports persistent right rib cage pain that worsens with cough.  Cough is new.  Still does not feel very well  A & P    Sepsis presumed UTI, but unclear etiology. Presented with fever 102.9 tachypnea, tachycardia without leukocytosis UA showed large leuks and hemoglobin with rare bacteria.  No localizing findings on chest x-ray or CT abdomen.  Right rib cage pain and now developing cough query pneumonia slowly developing pna not initially cough on admission chest x-ray, differential also includes gallbladder etiology given potential right abdominal pain (has history of close activity was ago) -Repeat chest x-ray in 24 hours, to ensure no interval development of pneumonia after hydration -Continue empiric Flagyl, ceftriaxone -Follow blood/urine cultures -Incentive spirometer -Right upper quad ultrasound  Hypertension, stable -Continue home HCTZ  Obesity, class 2 BMI 39 -Dietary consult  Normocytic anemia, stable Hemoglobin 8-9.  11.5  on admission, likely hemoconcentration related to dehydration -Monitor CBC, no current signs or symptoms of bleeding   Wet prep positive for clue cells, WBC Patient denies any vaginal discharge, vaginal irritation, or urinary symptoms -currently on iV flagyl  Hypocalcemia.  Albumin 3.5, corrected calcium 8.6, normal range 8.9 above.  Has history of vitamin D deficiency -Check ionized calcium -Check vitamin D level  Chronic back pain -Continue home Flexeril as needed -Continue home Percocet 10/325 mg p.o. every 4 hours as needed    Family Communication  : None  Code Status : Full code  Disposition Plan  :  Patient is from home. Anticipated d/c date: 2 to 3 days. Barriers to d/c or necessity for inpatient status:  IV antibiotics for treatment of sepsis of still undetermined etiology Consults  : None  Procedures  : None  DVT Prophylaxis  :  Lovenox  Lab Results  Component Value Date   PLT 155 08/11/2019    Diet :  Diet Order            Diet Heart Room service appropriate? Yes; Fluid consistency: Thin  Diet effective now                  Inpatient Medications Scheduled Meds: . enoxaparin (LOVENOX) injection  60 mg Subcutaneous Q24H  . gabapentin  800 mg Oral TID  . hydrochlorothiazide  25 mg Oral Daily   Continuous Infusions: . sodium chloride 125 mL/hr at 08/11/19 1252  . cefTRIAXone (ROCEPHIN)  IV    . metronidazole 500 mg (08/11/19 1302)   PRN Meds:.acetaminophen **OR** acetaminophen, cyclobenzaprine, ondansetron **OR** ondansetron (ZOFRAN) IV, oxyCODONE-acetaminophen **AND** oxyCODONE  Antibiotics  :   Anti-infectives (From admission, onward)   Start  Dose/Rate Route Frequency Ordered Stop   08/11/19 2200  cefTRIAXone (ROCEPHIN) 2 g in sodium chloride 0.9 % 100 mL IVPB     Discontinue     2 g 200 mL/hr over 30 Minutes Intravenous Every 24 hours 08/10/19 2355     08/11/19 0500  metroNIDAZOLE (FLAGYL) IVPB 500 mg     Discontinue     500 mg 100 mL/hr  over 60 Minutes Intravenous Every 8 hours 08/10/19 2356     08/10/19 2100  cefTRIAXone (ROCEPHIN) 2 g in sodium chloride 0.9 % 100 mL IVPB        2 g 200 mL/hr over 30 Minutes Intravenous  Once 08/10/19 2055 08/10/19 2146   08/10/19 2100  metroNIDAZOLE (FLAGYL) IVPB 500 mg        500 mg 100 mL/hr over 60 Minutes Intravenous  Once 08/10/19 2055 08/10/19 2244       Objective   Vitals:   08/11/19 1130 08/11/19 1200 08/11/19 1230 08/11/19 1300  BP: (!) 137/91 (!) 148/87 (!) 149/90 (!) 139/100  Pulse: (!) 109 (!) 107 (!) 108 (!) 113  Resp: (!) 10 23 21 22   Temp:      TempSrc:      SpO2: 92% 91% 94% 96%  Weight:      Height:        SpO2: 96 %  Wt Readings from Last 3 Encounters:  08/10/19 127 kg  07/13/19 135.2 kg  02/18/19 131.5 kg     Intake/Output Summary (Last 24 hours) at 08/11/2019 1304 Last data filed at 08/11/2019 1252 Gross per 24 hour  Intake 3139.66 ml  Output --  Net 3139.66 ml    Physical Exam:     Awake Alert, Oriented X 3, flat affect, in discomfort but no acute distress No new F.N deficits,  [place].AT, Normal respiratory effort on room air, diminished breath sounds at bases Tachycardic, no peripheral edema,No Gallops,Rubs or new Murmurs, Diminished bowel sounds, abdomen soft, epigastric tenderness without rebound tenderness, guarding or rigidity, negative Murphy sign, Right rib cage pain, reproducible on exam No Cyanosis, No new Rash or bruise     I have personally reviewed the following:   Data Reviewed:  CBC Recent Labs  Lab 08/10/19 2027 08/11/19 0523  WBC 9.2 6.7  HGB 11.5* 10.6*  HCT 36.2 35.7*  PLT 190 155  MCV 80.6 86.0  MCH 25.6* 25.5*  MCHC 31.8 29.7*  RDW 15.5 16.0*  LYMPHSABS 1.2 1.0  MONOABS 1.0 0.7  EOSABS 0.0 0.0  BASOSABS 0.0 0.0    Chemistries  Recent Labs  Lab 08/10/19 2027 08/11/19 0523  NA 137 139  K 3.9 3.7  CL 101 110  CO2 26 19*  GLUCOSE 98 88  BUN 10 9  CREATININE 1.10* 0.79  CALCIUM 8.2* 7.9*  AST  20  --   ALT 28  --   ALKPHOS 119  --   BILITOT 1.1  --    ------------------------------------------------------------------------------------------------------------------ No results for input(s): CHOL, HDL, LDLCALC, TRIG, CHOLHDL, LDLDIRECT in the last 72 hours.  Lab Results  Component Value Date   HGBA1C 5.4 10/07/2017   ------------------------------------------------------------------------------------------------------------------ No results for input(s): TSH, T4TOTAL, T3FREE, THYROIDAB in the last 72 hours.  Invalid input(s): FREET3 ------------------------------------------------------------------------------------------------------------------ No results for input(s): VITAMINB12, FOLATE, FERRITIN, TIBC, IRON, RETICCTPCT in the last 72 hours.  Coagulation profile Recent Labs  Lab 08/10/19 2100  INR 1.1    No results for input(s): DDIMER in the last 72 hours.  Cardiac Enzymes No results  for input(s): CKMB, TROPONINI, MYOGLOBIN in the last 168 hours.  Invalid input(s): CK ------------------------------------------------------------------------------------------------------------------    Component Value Date/Time   BNP 33.4 03/30/2017 0505    Micro Results Recent Results (from the past 240 hour(s))  SARS Coronavirus 2 by RT PCR (hospital order, performed in [facility] hospital lab) Nasopharyngeal Nasopharyngeal Swab     Status: None   Collection Time: 08/10/19  9:40 PM   Specimen: Nasopharyngeal Swab  Result Value Ref Range Status   SARS Coronavirus 2 NEGATIVE NEGATIVE Final    Comment: (NOTE) SARS-CoV-2 target nucleic acids are NOT DETECTED.  The SARS-CoV-2 RNA is generally detectable in upper and lower respiratory specimens during the acute phase of infection. The lowest concentration of SARS-CoV-2 viral copies this assay can detect is 250 copies / mL. A negative result does not preclude SARS-CoV-2 infection and should not be used as the sole basis for  treatment or other patient management decisions.  A negative result may occur with improper specimen collection / handling, submission of specimen other than nasopharyngeal swab, presence of viral mutation(s) within the areas targeted by this assay, and inadequate number of viral copies (<250 copies / mL). A negative result must be combined with clinical observations, patient history, and epidemiological information.  Fact Sheet for Patients:   BoilerBrush.com.cy  Fact Sheet for Healthcare Providers: https://pope.com/  This test is not yet approved or  cleared by the [place] FDA and has been authorized for detection and/or diagnosis of SARS-CoV-2 by FDA under an Emergency Use Authorization (EUA).  This EUA will remain in effect (meaning this test can be used) for the duration of the COVID-19 declaration under Section 564(b)(1) of the Act, 21 U.S.C. section 360bbb-3(b)(1), unless the authorization is terminated or revoked sooner.  Performed at [facility], [address] [place], [place] [address]   Wet prep, genital     Status: Abnormal   Collection Time: 08/10/19 11:00 PM   Specimen: PATH Cytology Cervicovaginal Ancillary Only  Result Value Ref Range Status   Yeast Wet Prep HPF POC NONE SEEN NONE SEEN Final   Trich, Wet Prep NONE SEEN NONE SEEN Final   Clue Cells Wet Prep HPF POC PRESENT (A) NONE SEEN Final   WBC, Wet Prep HPF POC PRESENT (A) NONE SEEN Final   Sperm NONE SEEN  Final    Comment: Performed at [facility], [address], [place], [place] [address]    Radiology Reports DG Chest 2 View  Result Date: 08/10/2019 CLINICAL DATA:  Infection EXAM: CHEST - 2 VIEW COMPARISON:  07/03/2016 FINDINGS: The heart size and mediastinal contours are within normal limits. Both lungs are clear. The visualized skeletal structures are unremarkable. IMPRESSION: No active cardiopulmonary  disease. Electronically Signed   By: Helyn Numbers MD   On: 08/10/2019 22:05   CT ABDOMEN PELVIS W CONTRAST  Result Date: 08/10/2019 CLINICAL DATA:  Right lower quadrant abdominal pain over the last 2 days. EXAM: CT ABDOMEN AND PELVIS WITH CONTRAST TECHNIQUE: Multidetector CT imaging of the abdomen and pelvis was performed using the standard protocol following bolus administration of intravenous contrast. CONTRAST:  OMNIPAQUE IOHEXOL 300 MG/ML  SOLN COMPARISON:  04/04/2018 FINDINGS: Lower chest: Normal Hepatobiliary: Normal appearance of the liver parenchyma. Previous cholecystectomy. Pancreas: Normal Spleen: 2.8 cm cyst of the spleen, enlarged from 10 mm in March of 2020. Adrenals/Urinary Tract: Adrenal glands are normal. Kidneys are normal. No cyst, mass, stone or hydronephrosis. Bladder is normal. Stomach/Bowel: Stomach and  small intestine are normal. Appendix as well seen as a normal structure in a retrocecal location. No acute colon pathology. Moderate amount of fecal matter. Vascular/Lymphatic: No vascular pathology. No retroperitoneal adenopathy. Reproductive: Uterus appears normal. Bilateral fallopian tube clips. No evidence of adnexal mass. Other: No free fluid or air. Previous Caesarean section scar. No evidence of hernia. 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 IMPRESSION: Normal appearing appendix. No abnormality seen to explain right lower quadrant pain. Moderate amount of fecal matter, but within the range of normal. Bilateral tubal clips, appearance unremarkable. 2.8 cm splenic cyst or pseudocyst, enlarged from 1 cm previously. Electronically Signed   By: Paulina Fusi M.D.   On: 08/10/2019 21:50     Time Spent in minutes  30     Laverna Peace M.D on 08/11/2019 at 1:04 PM  To page go to [email or web address] - password [email or web address]

## 2019-08-11 NOTE — Progress Notes (Signed)
   08/11/19 2158  Assess: MEWS Score  Temp (!) 103 F (39.4 C)  BP (!) 142/98  Pulse Rate (!) 108  Resp 18  O2 Device Room Air  Assess: MEWS Score  MEWS Temp 2  MEWS Systolic 0  MEWS Pulse 1  MEWS RR 0  MEWS LOC 0  MEWS Score 3  MEWS Score Color Yellow  Assess: if the MEWS score is Yellow or Red  Were vital signs taken at a resting state? Yes  Focused Assessment No change from prior assessment  Early Detection of Sepsis Score *See Row Information* Medium  MEWS guidelines implemented *See Row Information* No, previously yellow, continue vital signs every 4 hours  Treat  MEWS Interventions Administered prn meds/treatments  Pain Scale 0-10  Pain Score 8  Faces Pain Scale 6  Pain Location Abdomen  Pain Orientation Right  Pain Descriptors / Indicators Aching  Pain Frequency Constant  Pain Onset On-going  Pain Intervention(s) Medication (See eMAR)  Multiple Pain Sites No  Complains of Nausea /  Vomiting  Nausea relieved by Other (Comment) (PRN antiemetic given IV)  Take Vital Signs  Increase Vital Sign Frequency  Yellow: Q 2hr X 2 then Q 4hr X 2, if remains yellow, continue Q 4hrs  Escalate  MEWS: Escalate Yellow: discuss with charge nurse/RN and consider discussing with provider and RRT  Notify: Charge Nurse/RN  Name of Charge Nurse/RN Notified Tom  Date Charge Nurse/RN Notified 08/11/19  Time Charge Nurse/RN Notified 2200  Document  Progress note created (see row info) Yes   Pt given PRN tylenol 650mg  PO for elevated temp, ice packs under both arms, and IV Zofran for nausea. Yellow MEWS already in place and will continue as followed. Will continue to monitor the patient.

## 2019-08-12 ENCOUNTER — Inpatient Hospital Stay (HOSPITAL_COMMUNITY): Payer: Medicaid Other

## 2019-08-12 DIAGNOSIS — R1011 Right upper quadrant pain: Secondary | ICD-10-CM

## 2019-08-12 DIAGNOSIS — A4151 Sepsis due to Escherichia coli [E. coli]: Principal | ICD-10-CM

## 2019-08-12 LAB — GC/CHLAMYDIA PROBE AMP (~~LOC~~) NOT AT ARMC
Chlamydia: NEGATIVE
Comment: NEGATIVE
Comment: NORMAL
Neisseria Gonorrhea: POSITIVE — AB

## 2019-08-12 LAB — CBC WITH DIFFERENTIAL/PLATELET
Abs Immature Granulocytes: 0.04 10*3/uL (ref 0.00–0.07)
Basophils Absolute: 0 10*3/uL (ref 0.0–0.1)
Basophils Relative: 1 %
Eosinophils Absolute: 0 10*3/uL (ref 0.0–0.5)
Eosinophils Relative: 0 %
HCT: 31.6 % — ABNORMAL LOW (ref 36.0–46.0)
Hemoglobin: 9.6 g/dL — ABNORMAL LOW (ref 12.0–15.0)
Immature Granulocytes: 1 %
Lymphocytes Relative: 20 %
Lymphs Abs: 1.1 10*3/uL (ref 0.7–4.0)
MCH: 25.7 pg — ABNORMAL LOW (ref 26.0–34.0)
MCHC: 30.4 g/dL (ref 30.0–36.0)
MCV: 84.5 fL (ref 80.0–100.0)
Monocytes Absolute: 0.7 10*3/uL (ref 0.1–1.0)
Monocytes Relative: 13 %
Neutro Abs: 3.7 10*3/uL (ref 1.7–7.7)
Neutrophils Relative %: 65 %
Platelets: 135 10*3/uL — ABNORMAL LOW (ref 150–400)
RBC: 3.74 MIL/uL — ABNORMAL LOW (ref 3.87–5.11)
RDW: 15.9 % — ABNORMAL HIGH (ref 11.5–15.5)
WBC: 5.6 10*3/uL (ref 4.0–10.5)
nRBC: 0 % (ref 0.0–0.2)

## 2019-08-12 LAB — URINE CULTURE: Culture: 70000 — AB

## 2019-08-12 LAB — BASIC METABOLIC PANEL
Anion gap: 12 (ref 5–15)
BUN: 6 mg/dL (ref 6–20)
CO2: 21 mmol/L — ABNORMAL LOW (ref 22–32)
Calcium: 7.7 mg/dL — ABNORMAL LOW (ref 8.9–10.3)
Chloride: 106 mmol/L (ref 98–111)
Creatinine, Ser: 0.72 mg/dL (ref 0.44–1.00)
GFR calc Af Amer: >60 mL/min (ref >60)
GFR calc non Af Amer: >60 mL/min (ref >60)
Glucose, Bld: 93 mg/dL (ref 70–99)
Potassium: 3.7 mmol/L (ref 3.5–5.1)
Sodium: 139 mmol/L (ref 135–145)

## 2019-08-12 LAB — CALCIUM, IONIZED: Calcium, Ionized, Serum: 4.9 mg/dL (ref 4.5–5.6)

## 2019-08-12 LAB — MRSA PCR SCREENING: MRSA by PCR: NEGATIVE

## 2019-08-12 MED ORDER — SODIUM CHLORIDE (PF) 0.9 % IJ SOLN
INTRAMUSCULAR | Status: AC
  Filled 2019-08-12: qty 50

## 2019-08-12 MED ORDER — SODIUM CHLORIDE 0.9 % IV SOLN: INTRAVENOUS | Status: AC

## 2019-08-12 MED ORDER — VANCOMYCIN HCL 1250 MG/250ML IV SOLN
1250.0000 mg | Freq: Two times a day (BID) | INTRAVENOUS | Status: DC
  Administered 2019-08-12 – 2019-08-14 (×5): 1250 mg via INTRAVENOUS
  Filled 2019-08-12 (×6): qty 250

## 2019-08-12 MED ORDER — PROCHLORPERAZINE EDISYLATE 10 MG/2ML IJ SOLN: 10.0000 mg | INTRAMUSCULAR | Status: DC | PRN

## 2019-08-12 MED ORDER — ACETAMINOPHEN 500 MG PO TABS
1000.0000 mg | ORAL_TABLET | Freq: Three times a day (TID) | ORAL | Status: DC | PRN
  Administered 2019-08-12: 1000 mg via ORAL
  Filled 2019-08-12 (×2): qty 2

## 2019-08-12 MED ORDER — ACETAMINOPHEN 325 MG PO TABS
650.0000 mg | ORAL_TABLET | Freq: Once | ORAL | Status: AC
  Administered 2019-08-12: 650 mg via ORAL

## 2019-08-12 MED ORDER — VANCOMYCIN HCL 2000 MG/400ML IV SOLN
2000.0000 mg | Freq: Once | INTRAVENOUS | Status: AC
  Administered 2019-08-12: 2000 mg via INTRAVENOUS
  Filled 2019-08-12: qty 400

## 2019-08-12 MED ORDER — IOHEXOL 300 MG/ML  SOLN
75.0000 mL | Freq: Once | INTRAMUSCULAR | Status: AC | PRN
  Administered 2019-08-12: 75 mL via INTRAVENOUS

## 2019-08-12 MED ORDER — NYSTATIN 100000 UNIT/ML MT SUSP
5.0000 mL | Freq: Four times a day (QID) | OROMUCOSAL | Status: DC
  Administered 2019-08-12 – 2019-08-14 (×10): 500000 [IU] via ORAL
  Filled 2019-08-12 (×12): qty 5

## 2019-08-12 MED ORDER — PROCHLORPERAZINE EDISYLATE 10 MG/2ML IJ SOLN
10.0000 mg | Freq: Four times a day (QID) | INTRAMUSCULAR | Status: DC | PRN
  Administered 2019-08-12 – 2019-08-14 (×6): 10 mg via INTRAVENOUS
  Filled 2019-08-12 (×6): qty 2

## 2019-08-12 NOTE — Progress Notes (Signed)
   08/11/19 2158  Vitals  Temp (!) 103 F (39.4 C) (keisha notified of vitals.)  Temp Source Oral  BP (!) 142/98 (keisha notified of vitals.)  MAP (mmHg) 110  BP Location Right Arm  BP Method Automatic  Patient Position (if appropriate) Lying  Pulse Rate (!) 118 (keisha notified of vitals.)  Pulse Rate Source Dinamap  Resp 18  MEWS COLOR  MEWS Score Color Red  Oxygen Therapy  O2 Device Room Air  Pain Assessment  Pain Scale 0-10  Pain Score 8  Faces Pain Scale 6  Pain Location Abdomen  Pain Orientation Right  Pain Descriptors / Indicators Aching  Pain Frequency Constant  Pain Onset On-going  Pain Intervention(s) Medication (See eMAR)  Multiple Pain Sites No  Complaints & Interventions  Complains of Nausea /  Vomiting  Nausea relieved by Other (Comment) (PRN antiemetic given IV)  MEWS Score  MEWS Temp 2  MEWS Systolic 0  MEWS Pulse 2  MEWS RR 0  MEWS LOC 0  MEWS Score 4  Provider Notification  Provider Name/Title Blount  Date Provider Notified 08/11/19  Time Provider Notified 2205  Notification Type Page  Notification Reason  (elevated temp and HR)  Response  (waiting for response)   Pt turned red MEWS after continuing Yellow MEWS protocol to continue. Charge nurse notified and assisted with care. Will continue to monitor the patient.

## 2019-08-12 NOTE — Progress Notes (Signed)
Temp remains elevated. 101.3. New order for Tylenol 650 mg PO x1 given by MD who is at bedside. Will continue to monitor.

## 2019-08-12 NOTE — Progress Notes (Addendum)
   08/11/19 2255  Assess: MEWS Score  Temp (!) 103.2 F (39.6 C)  BP (!) 142/105  Pulse Rate (!) 111  Resp 18  SpO2 (!) 89 %  O2 Device Room Air  Patient Activity (if Appropriate) In bed  Assess: MEWS Score  MEWS Temp 2  MEWS Systolic 0  MEWS Pulse 2  MEWS RR 0  MEWS LOC 0  MEWS Score 4  MEWS Score Color Red  Assess: if the MEWS score is Yellow or Red  Were vital signs taken at a resting state? Yes  Focused Assessment Change from prior assessment (see assessment flowsheet)  Early Detection of Sepsis Score *See Row Information* Medium  MEWS guidelines implemented *See Row Information* Yes  Treat  MEWS Interventions Escalated (See documentation below);Other (Comment) (2L O2  [place])  Pain Scale Faces  Faces Pain Scale 8  Pain Location Chest  Pain Descriptors / Indicators Pressure  Pain Onset Unable to tell  Pain Intervention(s) Medication (See eMAR)  Multiple Pain Sites Yes  Complains of  (pressure in chest)  Interventions Medication (see MAR)  Nausea relieved by  (pt vomited)  2nd Pain Site  Wong-Baker Pain Rating 8  Pain Type Acute pain  Pain Location Head  Pain Descriptors / Indicators Throbbing  Pain Onset Unable to tell  Pain Intervention(s) Medication (See eMAR)  Take Vital Signs  Increase Vital Sign Frequency  Red: Q 1hr X 4 then Q 4hr X 4, if remains red, continue Q 4hrs  Escalate  MEWS: Escalate Red: discuss with charge nurse/RN and provider, consider discussing with RRT  Notify: Charge Nurse/RN  Name of Charge Nurse/RN Notified Tom  Date Charge Nurse/RN Notified 08/11/19  Time Charge Nurse/RN Notified 2255  Notify: Provider  Provider Name/Title Blount  Date Provider Notified 08/11/19  Time Provider Notified 2255  Notification Type Page  Notification Reason  (VS change and chest pressure)  Response  (one time dose of toradol, NS Bolus )  Date of Provider Response 08/11/19  Time of Provider Response  (provider put one time order in epic)   Document  Patient Outcome Not stable and remains on department (charge nurse Tom in pts room assisting with interventions)  Progress note created (see row info) Yes   Patient had increase in vitals. Put on 2L of O2 via [place]. EKG done and Bolus of NS continue. EKG is showing NSR. Charge nurse in room with this nurse assisting. Will continue to monitor the patient.

## 2019-08-12 NOTE — Progress Notes (Signed)
Patient requested PRN medications for pain and nausea. Upon assessment, patient noted to be visibly shaking. Temp 101. 2. Tylenol 650 mg PO, Dilaudid 1 mg IV, and Compazine 10 mg IV administered as ordered. MD notified via page of elevated temp. Patient left in bed resting with eyes closed. HOB elevated and bed alarm placed. Call bell within reach. Will continue to monitor.

## 2019-08-12 NOTE — Progress Notes (Signed)
[facility]  PROGRESS NOTE  Jacqueline Clarke BJY:[id number] DOB: 27-Nov-1985 DOA: 08/10/2019 PCP: [facility] Admit date - 08/10/2019   Admitting Physician Bobette Moavid Manuel Ortiz, MD  Outpatient [facility] the [facility]  LOS - 2 Brief Narrative   Jacqueline Jacqueline L Jacqueline Clarke is a 34 y.o. year old female with medical history significant for HTN,  obesity and a remote cholecystectomy (10 years ago) for cholelithiasis , Hyperemesis of pregnancy, vitamin D deficiency, class II obesity who presented with 2 days of worsening right rib cage/flank pain with radiation to the right lower back associated with nausea, vomiting, fevers, chills/rigors, diminished appetite.  Patient was found to be septic with presumed etiology being UTI based off UA findings.  Hospital course: Still has persistent fever, admits to pain with swallowing, sore throat, tongue pain   Subjective  Today reports persistent right-sided belly/rib cage pain, now denies any cough, reports sore throat, tongue swelling/pain.  A & P    Sepsis presumed UTI with E. coli bacteremia, but unclear etiology. Presented with fever 102.9 tachypnea, tachycardia without leukocytosis UA showed large leuks and hemoglobin with rare bacteria.  No localizing findings on chest x-ray x2, right upper quadrant ultrasound or CT abdomen.  Oral examination shows no abscess or other occult infection, some oral thrush is evident.   -DC Flagyl, continue ceftriaxone while awaiting culture sensitivities, did add vancomycin given persistently febrile but suspect just taking some time to defervesce (only received 2 doses of ceftriaxone (at max dose), check MRSA PCR screen -Follow repeat blood cultures -Incentive spirometer -Right upper quad ultrasound  Hypertension, stable -Continue home HCTZ  Obesity, class 2 BMI 39 -Dietary consult  Normocytic anemia, stable Hemoglobin 8-9.  11.5 on admission, likely  hemoconcentration related to dehydration -Monitor CBC, no current signs or symptoms of bleeding  Oral thrush -Nystatin swish and swallow, monitor   Wet prep positive for clue cells, WBC Patient denies any vaginal discharge, vaginal irritation, or urinary symptoms -currently on iV flagyl, will discontinue  Hypocalcemia.  Albumin 3.5, corrected calcium 8.6, normal range 8.9 above.  Has history of vitamin D deficiency -Check ionized calcium -Check vitamin D level  Chronic back pain -Continue home Flexeril as needed -Continue home Percocet 10/325 mg p.o. every 4 hours as needed    Family Communication  : None  Code Status : Full code  Disposition Plan  :  Patient is from home. Anticipated d/c date: 2 to 3 days. Barriers to d/c or necessity for inpatient status:  IV ceftriaxone for sepsis with E. coli bacteremia Consults  : None  Procedures  : None  DVT Prophylaxis  :  Lovenox  Lab Results  Component Value Date   PLT 135 (L) 08/12/2019    Diet :  Diet Order            Diet Heart Room service appropriate? Yes; Fluid consistency: Thin  Diet effective now                  Inpatient Medications Scheduled Meds: . enoxaparin (LOVENOX) injection  60 mg Subcutaneous Q24H  . gabapentin  800 mg Oral TID  . hydrochlorothiazide  25 mg Oral Daily  . nystatin  5 mL Oral QID   Continuous Infusions: . sodium chloride 125 mL/hr at 08/12/19 1232  . cefTRIAXone (ROCEPHIN)  IV 2 g (08/11/19 2230)  . metronidazole 500 mg (08/12/19 1238)  . vancomycin     PRN Meds:.acetaminophen **OR** [DISCONTINUED] acetaminophen, cyclobenzaprine, HYDROmorphone (DILAUDID)  injection, ondansetron **OR** ondansetron (ZOFRAN) IV, oxyCODONE-acetaminophen **AND** oxyCODONE, prochlorperazine  Antibiotics  :   Anti-infectives (From admission, onward)   Start     Dose/Rate Route Frequency Ordered Stop   08/12/19 2200  vancomycin (VANCOREADY) IVPB 1250 mg/250 mL     Discontinue     1,250 mg 166.7 mL/hr  over 90 Minutes Intravenous Every 12 hours 08/12/19 0951     08/12/19 1000  vancomycin (VANCOREADY) IVPB 2000 mg/400 mL        2,000 mg 200 mL/hr over 120 Minutes Intravenous  Once 08/12/19 0844 08/12/19 1304   08/11/19 2200  cefTRIAXone (ROCEPHIN) 2 g in sodium chloride 0.9 % 100 mL IVPB     Discontinue     2 g 200 mL/hr over 30 Minutes Intravenous Every 24 hours 08/10/19 2355     08/11/19 0500  metroNIDAZOLE (FLAGYL) IVPB 500 mg     Discontinue     500 mg 100 mL/hr over 60 Minutes Intravenous Every 8 hours 08/10/19 2356     08/10/19 2100  cefTRIAXone (ROCEPHIN) 2 g in sodium chloride 0.9 % 100 mL IVPB        2 g 200 mL/hr over 30 Minutes Intravenous  Once 08/10/19 2055 08/10/19 2146   08/10/19 2100  metroNIDAZOLE (FLAGYL) IVPB 500 mg        500 mg 100 mL/hr over 60 Minutes Intravenous  Once 08/10/19 2055 08/10/19 2244       Objective   Vitals:   08/12/19 0856 08/12/19 1000 08/12/19 1144 08/12/19 1206  BP:   124/75 (!) 136/81  Pulse:   81 87  Resp:    20  Temp: (!) 101.3 F (38.5 C) 99.6 F (37.6 C) 99 F (37.2 C) 99 F (37.2 C)  TempSrc: Oral Oral Oral   SpO2:   90% 94%  Weight:      Height:        SpO2: 94 % O2 Flow Rate (L/min): 2 L/min  Wt Readings from Last 3 Encounters:  08/10/19 127 kg  07/13/19 135.2 kg  02/18/19 131.5 kg     Intake/Output Summary (Last 24 hours) at 08/12/2019 1413 Last data filed at 08/12/2019 0426 Gross per 24 hour  Intake 1773.02 ml  Output --  Net 1773.02 ml    Physical Exam:     Awake Alert, Oriented X 3, flat affect, in discomfort but no acute distress No new F.N deficits,  [place].AT, Normal respiratory effort on room air, diminished breath sounds at bases Tachycardic, no peripheral edema,No Gallops,Rubs or new Murmurs, Diminished bowel sounds, abdomen soft, no rebound tenderness or guarding  right rib cage pain, reproducible on exam No Cyanosis, No new Rash or bruise     I have personally reviewed the following:    Data Reviewed:  CBC Recent Labs  Lab 08/10/19 2027 08/11/19 0523 08/12/19 0442  WBC 9.2 6.7 5.6  HGB 11.5* 10.6* 9.6*  HCT 36.2 35.7* 31.6*  PLT 190 155 135*  MCV 80.6 86.0 84.5  MCH 25.6* 25.5* 25.7*  MCHC 31.8 29.7* 30.4  RDW 15.5 16.0* 15.9*  LYMPHSABS 1.2 1.0 1.1  MONOABS 1.0 0.7 0.7  EOSABS 0.0 0.0 0.0  BASOSABS 0.0 0.0 0.0    Chemistries  Recent Labs  Lab 08/10/19 2027 08/11/19 0523 08/12/19 0442  NA 137 139 139  K 3.9 3.7 3.7  CL 101 110 106  CO2 26 19* 21*  GLUCOSE 98 88 93  BUN 10 9 6   CREATININE 1.10* 0.79 0.72  CALCIUM 8.2* 7.9* 7.7*  AST 20  --   --   ALT 28  --   --   ALKPHOS 119  --   --   BILITOT 1.1  --   --    ------------------------------------------------------------------------------------------------------------------ No results for input(s): CHOL, HDL, LDLCALC, TRIG, CHOLHDL, LDLDIRECT in the last 72 hours.  Lab Results  Component Value Date   HGBA1C 5.4 10/07/2017   ------------------------------------------------------------------------------------------------------------------ No results for input(s): TSH, T4TOTAL, T3FREE, THYROIDAB in the last 72 hours.  Invalid input(s): FREET3 ------------------------------------------------------------------------------------------------------------------ No results for input(s): VITAMINB12, FOLATE, FERRITIN, TIBC, IRON, RETICCTPCT in the last 72 hours.  Coagulation profile Recent Labs  Lab 08/10/19 2100  INR 1.1    No results for input(s): DDIMER in the last 72 hours.  Cardiac Enzymes No results for input(s): CKMB, TROPONINI, MYOGLOBIN in the last 168 hours.  Invalid input(s): CK ------------------------------------------------------------------------------------------------------------------    Component Value Date/Time   BNP 33.4 03/30/2017 0505    Micro Results Recent Results (from the past 240 hour(s))  Urine culture     Status: Abnormal (Preliminary result)    Collection Time: 08/10/19  8:42 PM   Specimen: Urine, Clean Catch  Result Value Ref Range Status   Specimen Description   Final    URINE, CLEAN CATCH Performed at [facility], [address], [place], [place] [address]    Special Requests   Final    NONE Performed at [facility], [address] [place], [place] [address]    Culture (A)  Final    70,000 COLONIES/mL Romie Minus NEGATIVE RODS IDENTIFICATION AND SUSCEPTIBILITIES TO FOLLOW Performed at [facility], [address], [place], [place] [address]    Report Status PENDING  Incomplete  Blood Culture (routine x 2)     Status: None (Preliminary result)   Collection Time: 08/10/19  9:11 PM   Specimen: BLOOD  Result Value Ref Range Status   Specimen Description   Final    BLOOD LEFT ANTECUBITAL Performed at [facility], [address], [place], [place] [address]    Special Requests   Final    BOTTLES DRAWN AEROBIC AND ANAEROBIC Blood Culture adequate volume Performed at [facility], [address], [place], [place] [address]    Culture  Setup Time   Final    AEROBIC BOTTLE ONLY GRAM NEGATIVE RODS CRITICAL VALUE NOTED.  VALUE IS CONSISTENT WITH PREVIOUSLY REPORTED AND CALLED VALUE. Performed at [facility], [address], [place], [place] [address]    Culture GRAM NEGATIVE RODS  Final   Report Status PENDING  Incomplete  Blood Culture (routine x 2)     Status: Abnormal (Preliminary result)   Collection Time: 08/10/19  9:11 PM   Specimen: BLOOD  Result Value Ref Range Status   Specimen Description   Final    BLOOD LEFT ANTECUBITAL Performed at [facility], [address] [place], [place] [address]    Special Requests   Final    BOTTLES DRAWN AEROBIC AND ANAEROBIC Blood Culture results may not be optimal due to an excessive volume of blood received in culture bottles Performed at [facility], [address] [place], [place] [address]    Culture  Setup Time   Final    ANAEROBIC BOTTLE ONLY GRAM NEGATIVE RODS CRITICAL RESULT CALLED TO, READ BACK BY AND VERIFIED WITHEarlean Shawl [facility] 2841 08/11/19 A BROWNING    Culture (A)  Final  ESCHERICHIA COLI SUSCEPTIBILITIES TO FOLLOW Performed at [facility], [address] [place], [place] [address]    Report Status PENDING  Incomplete  Blood Culture ID Panel (Reflexed)     Status: Abnormal   Collection Time: 08/10/19  9:11 PM  Result Value Ref Range Status   Enterococcus species NOT DETECTED NOT DETECTED Final   Listeria monocytogenes NOT DETECTED NOT DETECTED Final   Staphylococcus species NOT DETECTED NOT DETECTED Final   Staphylococcus aureus (BCID) NOT DETECTED NOT DETECTED Final   Streptococcus species NOT DETECTED NOT DETECTED Final   Streptococcus agalactiae NOT DETECTED NOT DETECTED Final   Streptococcus pneumoniae NOT DETECTED NOT DETECTED Final   Streptococcus pyogenes NOT DETECTED NOT DETECTED Final   Acinetobacter baumannii NOT DETECTED NOT DETECTED Final   Enterobacteriaceae species DETECTED (A) NOT DETECTED Final    Comment: Enterobacteriaceae represent a large family of gram-negative bacteria, not a single organism. CRITICAL RESULT CALLED TO, READ BACK BY AND VERIFIED WITH: Earlean Shawl PHARMD [id number] 08/11/19 A BROWNING    Enterobacter cloacae complex NOT DETECTED NOT DETECTED Final   Escherichia coli DETECTED (A) NOT DETECTED Final    Comment: CRITICAL RESULT CALLED TO, READ BACK BY AND VERIFIED WITH: Earlean Shawl PHARMD [id number] 08/11/19 A BROWNING    Klebsiella oxytoca NOT DETECTED NOT DETECTED Final   Klebsiella pneumoniae NOT DETECTED NOT DETECTED Final   Proteus species NOT DETECTED NOT DETECTED Final   Serratia marcescens NOT DETECTED NOT DETECTED Final   Carbapenem resistance NOT DETECTED NOT DETECTED Final   Haemophilus influenzae NOT DETECTED NOT DETECTED Final   Neisseria meningitidis NOT DETECTED NOT DETECTED  Final   Pseudomonas aeruginosa NOT DETECTED NOT DETECTED Final   Candida albicans NOT DETECTED NOT DETECTED Final   Candida glabrata NOT DETECTED NOT DETECTED Final   Candida krusei NOT DETECTED NOT DETECTED Final   Candida parapsilosis NOT DETECTED NOT DETECTED Final   Candida tropicalis NOT DETECTED NOT DETECTED Final    Comment: Performed at [facility], [address], [place], [place] [address]  SARS Coronavirus 2 by RT PCR (hospital order, performed in [facility] hospital lab) Nasopharyngeal Nasopharyngeal Swab     Status: None   Collection Time: 08/10/19  9:40 PM   Specimen: Nasopharyngeal Swab  Result Value Ref Range Status   SARS Coronavirus 2 NEGATIVE NEGATIVE Final    Comment: (NOTE) SARS-CoV-2 target nucleic acids are NOT DETECTED.  The SARS-CoV-2 RNA is generally detectable in upper and lower respiratory specimens during the acute phase of infection. The lowest concentration of SARS-CoV-2 viral copies this assay can detect is 250 copies / mL. A negative result does not preclude SARS-CoV-2 infection and should not be used as the sole basis for treatment or other patient management decisions.  A negative result may occur with improper specimen collection / handling, submission of specimen other than nasopharyngeal swab, presence of viral mutation(s) within the areas targeted by this assay, and inadequate number of viral copies (<250 copies / mL). A negative result must be combined with clinical observations, patient history, and epidemiological information.  Fact Sheet for Patients:   BoilerBrush.com.cy  Fact Sheet for Healthcare Providers: https://pope.com/  This test is not yet approved or  cleared by the [place] FDA and has been authorized for detection and/or diagnosis of SARS-CoV-2 by FDA under an Emergency Use Authorization (EUA).  This EUA will remain in effect (meaning this test can be used) for the  duration of the COVID-19 declaration under Section  564(b)(1) of the Act, 21 U.S.C. section 360bbb-3(b)(1), unless the authorization is terminated or revoked sooner.  Performed at [facility], [address] [place], [place] [address]   Wet prep, genital     Status: Abnormal   Collection Time: 08/10/19 11:00 PM   Specimen: PATH Cytology Cervicovaginal Ancillary Only  Result Value Ref Range Status   Yeast Wet Prep HPF POC NONE SEEN NONE SEEN Final   Trich, Wet Prep NONE SEEN NONE SEEN Final   Clue Cells Wet Prep HPF POC PRESENT (A) NONE SEEN Final   WBC, Wet Prep HPF POC PRESENT (A) NONE SEEN Final   Sperm NONE SEEN  Final    Comment: Performed at [facility], [address] [place] [address], [place], [place] [address]  Culture, blood (routine x 2)     Status: None (Preliminary result)   Collection Time: 08/12/19  4:42 AM   Specimen: BLOOD LEFT WRIST  Result Value Ref Range Status   Specimen Description   Final    BLOOD LEFT WRIST Performed at [facility], [address] [place], [place] [address]    Special Requests   Final    BOTTLES DRAWN AEROBIC ONLY Blood Culture results may not be optimal due to an inadequate volume of blood received in culture bottles Performed at [facility], [address], [place], [place] [address]    Culture PENDING  Incomplete   Report Status PENDING  Incomplete    Radiology Reports DG Chest 2 View  Result Date: 08/11/2019 CLINICAL DATA:  Hyper emesis of pregnancy. EXAM: CHEST - 2 VIEW COMPARISON:  None. FINDINGS: Very mild linear atelectasis is seen within the bilateral lung bases. There is no evidence of a pleural effusion or pneumothorax. The heart size and mediastinal contours are within normal limits. The visualized skeletal structures are unremarkable. IMPRESSION: Very mild bibasilar linear atelectasis. Electronically Signed   By: Aram Candela M.D.   On: 08/11/2019 18:33   DG Chest 2  View  Result Date: 08/10/2019 CLINICAL DATA:  Infection EXAM: CHEST - 2 VIEW COMPARISON:  07/03/2016 FINDINGS: The heart size and mediastinal contours are within normal limits. Both lungs are clear. The visualized skeletal structures are unremarkable. IMPRESSION: No active cardiopulmonary disease. Electronically Signed   By: Helyn Numbers MD   On: 08/10/2019 22:05   CT ABDOMEN PELVIS W CONTRAST  Result Date: 08/10/2019 CLINICAL DATA:  Right lower quadrant abdominal pain over the last 2 days. EXAM: CT ABDOMEN AND PELVIS WITH CONTRAST TECHNIQUE: Multidetector CT imaging of the abdomen and pelvis was performed using the standard protocol following bolus administration of intravenous contrast. CONTRAST:  OMNIPAQUE IOHEXOL 300 MG/ML  SOLN COMPARISON:  04/04/2018 FINDINGS: Lower chest: Normal Hepatobiliary: Normal appearance of the liver parenchyma. Previous cholecystectomy. Pancreas: Normal Spleen: 2.8 cm cyst of the spleen, enlarged from 10 mm in March of 2020. Adrenals/Urinary Tract: Adrenal glands are normal. Kidneys are normal. No cyst, mass, stone or hydronephrosis. Bladder is normal. Stomach/Bowel: Stomach and small intestine are normal. Appendix as well seen as a normal structure in a retrocecal location. No acute colon pathology. Moderate amount of fecal matter. Vascular/Lymphatic: No vascular pathology. No retroperitoneal adenopathy. Reproductive: Uterus appears normal. Bilateral fallopian tube clips. No evidence of adnexal mass. Other: No free fluid or air. Previous Caesarean section scar. No evidence of hernia. 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 IMPRESSION: Normal appearing appendix. No abnormality seen to explain right lower quadrant pain. Moderate amount of fecal matter,  but within the range of normal. Bilateral tubal clips, appearance unremarkable. 2.8 cm splenic cyst or pseudocyst, enlarged from 1 cm previously. Electronically Signed   By: Paulina Fusi M.D.   On: 08/10/2019  21:50   US Abdomen Limited RUQ  Result Date: 08/11/2019 CLINICAL DATA:  Right upper quadrant pain. EXAM: ULTRASOUND ABDOMEN LIMITED RIGHT UPPER QUADRANT COMPARISON:  CT abdomen pelvis from yesterday. FINDINGS: Gallbladder: Surgically absent. Common bile duct: Diameter: 3 mm, normal. Liver: No focal lesion identified. Mildly enlarged. Within normal limits in parenchymal echogenicity. Portal vein is patent on color Doppler imaging with normal direction of blood flow towards the liver. Other: None. IMPRESSION: 1. No acute abnormality. 2. Mild hepatomegaly. Electronically Signed   By: Obie Dredge M.D.   On: 08/11/2019 19:43     Time Spent in minutes  30     Laverna Peace M.D on 08/12/2019 at 2:13 PM  To page go to [email or web address] - password [facility]

## 2019-08-12 NOTE — Progress Notes (Signed)
   08/12/19 1651  Vitals  Temp (!) 102 F (38.9 C)  Temp Source Oral  BP (!) 130/83  MAP (mmHg) 95  BP Location Right Arm  BP Method Automatic  Patient Position (if appropriate) Sitting  Pulse Rate 86  Pulse Rate Source Monitor  Resp 19  MEWS COLOR  MEWS Score Color Yellow  Oxygen Therapy  SpO2 93 %  O2 Device Nasal Cannula  O2 Flow Rate (L/min) 2 L/min  Patient Activity (if Appropriate) In bed  Pain Assessment  Pain Scale 0-10  Pain Score Asleep  MEWS Score  MEWS Temp 2  MEWS Systolic 0  MEWS Pulse 0  MEWS RR 0  MEWS LOC 0  MEWS Score 2  Provider Notification  Provider Name/Title Dr. Dennison Nancy  Date Provider Notified 08/11/19  Time Provider Notified 1700  Notification Type Page  Notification Reason Change in status;Other (Comment) (elevated temp)  Response See new orders  Date of Provider Response 08/12/19   YELLOW MEWS initiated due to change in patient status. Department director and MD are aware.

## 2019-08-12 NOTE — Progress Notes (Signed)
   08/11/19 2316  Assess: MEWS Score  Temp (!) 103.1 F (39.5 C)  BP (!) 136/91  Pulse Rate 97  Resp (!) 24  Level of Consciousness Alert  SpO2 97 %  O2 Device Nasal Cannula  Patient Activity (if Appropriate) In bed  O2 Flow Rate (L/min) 2 L/min  Assess: MEWS Score  MEWS Temp 2  MEWS Systolic 0  MEWS Pulse 0  MEWS RR 1  MEWS LOC 0  MEWS Score 3  MEWS Score Color Yellow  Assess: if the MEWS score is Yellow or Red  Were vital signs taken at a resting state? Yes  Focused Assessment No change from prior assessment  Early Detection of Sepsis Score *See Row Information* Medium  MEWS guidelines implemented *See Row Information* Yes  Treat  MEWS Interventions Escalated (See documentation below) (charge nurse remain in room with pt and nurse)  Pain Scale Faces  Faces Pain Scale 4  Pain Type Acute pain (pt state pain is getting better)  Pain Location Chest  Pain Orientation Right  Pain Descriptors / Indicators Pressure  Pain Onset Unable to tell  Pain Intervention(s) Medication (See eMAR)  Multiple Pain Sites Yes  Patients response to intervention Relief  Take Vital Signs  Increase Vital Sign Frequency  Yellow: Q 2hr X 2 then Q 4hr X 2, if remains yellow, continue Q 4hrs  Escalate  MEWS: Escalate Yellow: discuss with charge nurse/RN and consider discussing with provider and RRT  Notify: Charge Nurse/RN  Name of Charge Nurse/RN Notified Tom  Date Charge Nurse/RN Notified 08/11/19  Time Charge Nurse/RN Notified 2316  Document  Patient Outcome Stabilized after interventions  Progress note created (see row info) Yes   Pt is now in Yellow MEWS and resting in bed. Will continue to monitor the patient.

## 2019-08-12 NOTE — Progress Notes (Signed)
   08/12/19 0009  Assess: MEWS Score  Temp (!) 101.3 F (38.5 C)  BP (!) 132/95  Pulse Rate 93  Resp 18  SpO2 100 %  Assess: MEWS Score  MEWS Temp 1  MEWS Systolic 0  MEWS Pulse 0  MEWS RR 0  MEWS LOC 0  MEWS Score 1  MEWS Score Color Green  Assess: if the MEWS score is Yellow or Red  Were vital signs taken at a resting state? Yes  Focused Assessment No change from prior assessment  Early Detection of Sepsis Score *See Row Information* Medium  MEWS guidelines implemented *See Row Information* No, previously yellow, continue vital signs every 4 hours  Treat  MEWS Interventions Other (Comment) (continue to monitor the pt)  Take Vital Signs  Increase Vital Sign Frequency  Yellow: Q 2hr X 2 then Q 4hr X 2, if remains yellow, continue Q 4hrs  Escalate  MEWS: Escalate Yellow: discuss with charge nurse/RN and consider discussing with provider and RRT  Notify: Charge Nurse/RN  Name of Charge Nurse/RN Notified Tom  Date Charge Nurse/RN Notified 08/12/19  Time Charge Nurse/RN Notified 0009  Document  Patient Outcome Stabilized after interventions  Progress note created (see row info) Yes   Pt is now green MEWS and continuing to follow yellow MEWS guidelines. Will continue to monitor the patient.

## 2019-08-12 NOTE — Progress Notes (Signed)
Pharmacy Antibiotic Note  Jacqueline Clarke is a 34 y.o. female admitted on 08/10/2019 with spiking fevers.  Pharmacy has been consulted for vancomycin dosing.  Pt spiking fevers. Vancomycin added to for additional coverage.   Plan:  Vancomycin 2000 mg IV x1, then 1250 mg IV q12h    Ceftriaxone/metronidazole per MD.  Monitor clinical course, renal function, cultures as available   Height: 5\' 11"  (180.3 cm) Weight: 127 kg (280 lb) IBW/kg (Calculated) : 70.8  Temp (24hrs), Avg:101.6 F (38.7 C), Min:98.6 F (37 C), Max:103.2 F (39.6 C)  Recent Labs  Lab 08/10/19 2025 08/10/19 2027 08/11/19 0523 08/12/19 0442  WBC  --  9.2 6.7 5.6  CREATININE  --  1.10* 0.79 0.72  LATICACIDVEN 1.0  --   --   --     Estimated Creatinine Clearance: 148.7 mL/min (by C-G formula based on SCr of 0.72 mg/dL).    No Known Allergies  Antimicrobials this admission: 7/21 ceftriaxone >>  7/21 metronidazole >>  7/23 vancomycin >>   Dose adjustments this admission:    Microbiology results: 7/21 BCx: 1/4 bottles E.coli  7/21 UCx: 70k colonies of GNR   7/21 COVID: neg 7/21 Wet prep: + clue cells, WBC 7/23 BCx:   Thank you for allowing pharmacy to be a part of this patients care.  8/23, PharmD, BCPS 08/12/2019 9:53 AM

## 2019-08-12 NOTE — Progress Notes (Signed)
Blood pressure (!) 130/83, pulse 86, temperature (!) 102 F (38.9 C), temperature source Oral, resp. rate 19, height 5\' 11"  (1.803 m), weight 127 kg, SpO2 93 % on 2L [place].   YELLOW MEWS initiated. MD notified of change in patient status.

## 2019-08-12 NOTE — Progress Notes (Signed)
Temp remains elevated. 99.6. Will continue to monitor.

## 2019-08-13 ENCOUNTER — Inpatient Hospital Stay (HOSPITAL_COMMUNITY): Payer: Medicaid Other

## 2019-08-13 DIAGNOSIS — R918 Other nonspecific abnormal finding of lung field: Secondary | ICD-10-CM

## 2019-08-13 DIAGNOSIS — M542 Cervicalgia: Secondary | ICD-10-CM

## 2019-08-13 DIAGNOSIS — A4151 Sepsis due to Escherichia coli [E. coli]: Secondary | ICD-10-CM

## 2019-08-13 DIAGNOSIS — R7881 Bacteremia: Secondary | ICD-10-CM

## 2019-08-13 LAB — CULTURE, BLOOD (ROUTINE X 2): Special Requests: ADEQUATE

## 2019-08-13 LAB — CBC WITH DIFFERENTIAL/PLATELET
Abs Immature Granulocytes: 0.2 10*3/uL — ABNORMAL HIGH (ref 0.00–0.07)
Basophils Absolute: 0 10*3/uL (ref 0.0–0.1)
Basophils Relative: 1 %
Eosinophils Absolute: 0.2 10*3/uL (ref 0.0–0.5)
Eosinophils Relative: 2 %
HCT: 32 % — ABNORMAL LOW (ref 36.0–46.0)
Hemoglobin: 10.2 g/dL — ABNORMAL LOW (ref 12.0–15.0)
Immature Granulocytes: 3 %
Lymphocytes Relative: 28 %
Lymphs Abs: 2.2 10*3/uL (ref 0.7–4.0)
MCH: 25.5 pg — ABNORMAL LOW (ref 26.0–34.0)
MCHC: 31.9 g/dL (ref 30.0–36.0)
MCV: 80 fL (ref 80.0–100.0)
Monocytes Absolute: 1.2 10*3/uL — ABNORMAL HIGH (ref 0.1–1.0)
Monocytes Relative: 16 %
Neutro Abs: 3.9 10*3/uL (ref 1.7–7.7)
Neutrophils Relative %: 50 %
Platelets: 155 10*3/uL (ref 150–400)
RBC: 4 MIL/uL (ref 3.87–5.11)
RDW: 15.6 % — ABNORMAL HIGH (ref 11.5–15.5)
WBC: 7.7 10*3/uL (ref 4.0–10.5)
nRBC: 0 % (ref 0.0–0.2)

## 2019-08-13 MED ORDER — AMLODIPINE BESYLATE 10 MG PO TABS
10.0000 mg | ORAL_TABLET | Freq: Every day | ORAL | Status: DC
  Administered 2019-08-13 – 2019-08-15 (×3): 10 mg via ORAL
  Filled 2019-08-13 (×3): qty 1

## 2019-08-13 MED ORDER — GADOBUTROL 1 MMOL/ML IV SOLN: 10.0000 mL | Freq: Once | INTRAVENOUS | Status: DC | PRN

## 2019-08-13 MED ORDER — CYCLOBENZAPRINE HCL 10 MG PO TABS
10.0000 mg | ORAL_TABLET | Freq: Three times a day (TID) | ORAL | Status: DC | PRN
  Administered 2019-08-13 – 2019-08-14 (×3): 10 mg via ORAL
  Filled 2019-08-13 (×3): qty 1

## 2019-08-13 NOTE — Progress Notes (Signed)
[facility]  PROGRESS NOTE  Jacqueline Clarke EHM:[id number] DOB: 11-04-1985 DOA: 08/10/2019 PCP: [facility] Admit date - 08/10/2019   Admitting Physician Bobette Mo, MD  Outpatient Primary MD for the patient is [facility]  LOS - 3 Brief Narrative   Jacqueline Clarke is a 34 y.o. year old female with medical history significant for HTN,  obesity and a remote cholecystectomy (10 years ago) for cholelithiasis , Hyperemesis of pregnancy, vitamin D deficiency, class II obesity who presented with 2 days of worsening right rib cage/flank pain with radiation to the right lower back associated with nausea, vomiting, fevers, chills/rigors, diminished appetite.  Patient was found to be septic with presumed etiology being UTI based off UA findings.  Hospital course: Still has persistent fever, admits to pain with swallowing, sore throat, tongue pain   Subjective  Today neck pain, headache, right-sided abdominal pain, minimal appetite  A & P    Sepsis presumed UTI with E. coli bacteremia, but unclear etiology.  With addition of vancomycin to ceftriaxone has been afebrile for the past 24 hours.  Repeat blood cultures still unremarkable.  CT 6 can of chest Due to transient hypoxia and O2 requirements (now resolved) groundglass opacities in right upper lobe concerning for potential infection/pneumonia, and also notes 3.3 cm cystic focus in posterior spleen, query potential splenic abscess given the setting of bacteremia.  Patient unable to undergo MRI of abdomen due to facial piercings that cannot be removed -Given clinical improvement on vancomycin and ceftriaxone we will continue to closely monitor -Monitor repeat blood cultures. -Follow repeat blood cultures -Incentive spirometer  Neck pain, reproducible with palpation of posterior neck.  No meningismal signs on examination.  Likely muscle spasm related to positioning in bed -Flexeril as needed, warm  compress, monitor  Hypertension,.  Diastolic blood pressure greater than 90s. -Given sepsis physiology essentially resolved, resume home amlodipine 10 mg -Continue home HCTZ  Obesity, class 2 BMI 39 -Dietary consult  Normocytic anemia, stable Hemoglobin 10.  11.5 on admission, likely hemoconcentration related to dehydration -Monitor CBC, no current signs or symptoms of bleeding  Oral thrush -Nystatin swish and swallow, monitor   Wet prep positive for clue cells, WBC Patient denies any vaginal discharge, vaginal irritation, or urinary symptoms -Flagyl discontinued  Hypocalcemia.  Albumin 3.5, corrected calcium 8.6, normal range 8.9 above.  Has history of vitamin D deficiency -Check ionized calcium -Check vitamin D level  Chronic back pain -Continue home Flexeril as needed -Continue home Percocet 10/325 mg p.o. every 4 hours as needed      Family Communication  : None  Code Status : Full code  Disposition Plan  :  Patient is from home. Anticipated d/c date: 2 to 3 days. Barriers to d/c or necessity for inpatient status:  IV ceftriaxone and vancomycin for E. coli bacteremia, currently of unclear etiology Consults  : None  Procedures  : None  DVT Prophylaxis  :  Lovenox  Lab Results  Component Value Date   PLT 155 08/13/2019    Diet :  Diet Order            Diet Heart Room service appropriate? Yes; Fluid consistency: Thin  Diet effective now                  Inpatient Medications Scheduled Meds: . enoxaparin (LOVENOX) injection  60 mg Subcutaneous Q24H  . gabapentin  800 mg Oral TID  . hydrochlorothiazide  25 mg Oral Daily  .  nystatin  5 mL Oral QID   Continuous Infusions: . cefTRIAXone (ROCEPHIN)  IV 2 g (08/12/19 2140)  . vancomycin 1,250 mg (08/13/19 1158)   PRN Meds:.acetaminophen **OR** [DISCONTINUED] acetaminophen, cyclobenzaprine, gadobutrol, HYDROmorphone (DILAUDID) injection, ondansetron **OR** ondansetron (ZOFRAN) IV, oxyCODONE-acetaminophen  **AND** oxyCODONE, prochlorperazine  Antibiotics  :   Anti-infectives (From admission, onward)   Start     Dose/Rate Route Frequency Ordered Stop   08/12/19 2200  vancomycin (VANCOREADY) IVPB 1250 mg/250 mL     Discontinue     1,250 mg 166.7 mL/hr over 90 Minutes Intravenous Every 12 hours 08/12/19 0951     08/12/19 1000  vancomycin (VANCOREADY) IVPB 2000 mg/400 mL        2,000 mg 200 mL/hr over 120 Minutes Intravenous  Once 08/12/19 0844 08/12/19 1304   08/11/19 2200  cefTRIAXone (ROCEPHIN) 2 g in sodium chloride 0.9 % 100 mL IVPB     Discontinue     2 g 200 mL/hr over 30 Minutes Intravenous Every 24 hours 08/10/19 2355     08/11/19 0500  metroNIDAZOLE (FLAGYL) IVPB 500 mg  Status:  Discontinued        500 mg 100 mL/hr over 60 Minutes Intravenous Every 8 hours 08/10/19 2356 08/13/19 1255   08/10/19 2100  cefTRIAXone (ROCEPHIN) 2 g in sodium chloride 0.9 % 100 mL IVPB        2 g 200 mL/hr over 30 Minutes Intravenous  Once 08/10/19 2055 08/10/19 2146   08/10/19 2100  metroNIDAZOLE (FLAGYL) IVPB 500 mg        500 mg 100 mL/hr over 60 Minutes Intravenous  Once 08/10/19 2055 08/10/19 2244       Objective   Vitals:   08/13/19 0456 08/13/19 0759 08/13/19 1204 08/13/19 1602  BP: 124/76 (!) 133/93 (!) 138/99 (!) 138/106  Pulse: 76 62 68 84  Resp: Temp: 100 F (37.8 C) 99.7 F (37.6 C) 98.9 F (37.2 C) 98.6 F (37 C)  TempSrc: Oral Oral Oral Oral  SpO2: 98%  99% 99%  Weight:      Height:        SpO2: 99 % O2 Flow Rate (L/min): 20 L/min  Wt Readings from Last 3 Encounters:  08/10/19 127 kg  07/13/19 135.2 kg  02/18/19 131.5 kg     Intake/Output Summary (Last 24 hours) at 08/13/2019 2042 Last data filed at 08/13/2019 1530 Gross per 24 hour  Intake 360 ml  Output --  Net 360 ml    Physical Exam:     Awake Alert, Oriented X 3, flat affect, in discomfort but no acute distress No new F.N deficits,  [place].AT, Normal respiratory effort on room air,  diminished breath sounds at bases Regular rate and rhythm, no peripheral edema,No Gallops,Rubs or new Murmurs, Diminished bowel sounds, abdomen soft, no rebound tenderness or guarding  right rib cage pain, reproducible on exam, known left abdominal pain Reproducible pain in posterior neck, No Cyanosis, No new Rash or bruise     I have personally reviewed the following:   Data Reviewed:  CBC Recent Labs  Lab 08/10/19 2027 08/11/19 0523 08/12/19 0442 08/13/19 0426  WBC 9.2 6.7 5.6 7.7  HGB 11.5* 10.6* 9.6* 10.2*  HCT 36.2 35.7* 31.6* 32.0*  PLT 190 155 135* 155  MCV 80.6 86.0 84.5 80.0  MCH 25.6* 25.5* 25.7* 25.5*  MCHC 31.8 29.7* 30.4 31.9  RDW 15.5 16.0* 15.9* 15.6*  LYMPHSABS 1.2 1.0 1.1 2.2  MONOABS 1.0  0.7 0.7 1.2*  EOSABS 0.0 0.0 0.0 0.2  BASOSABS 0.0 0.0 0.0 0.0    Chemistries  Recent Labs  Lab 08/10/19 2027 08/11/19 0523 08/12/19 0442  NA 137 139 139  K 3.9 3.7 3.7  CL 101 110 106  CO2 26 19* 21*  GLUCOSE 98 88 93  BUN CREATININE 1.10* 0.79 0.72  CALCIUM 8.2* 7.9* 7.7*  AST 20  --   --   ALT 28  --   --   ALKPHOS 119  --   --   BILITOT 1.1  --   --    ------------------------------------------------------------------------------------------------------------------ No results for input(s): CHOL, HDL, LDLCALC, TRIG, CHOLHDL, LDLDIRECT in the last 72 hours.  Lab Results  Component Value Date   HGBA1C 5.4 10/07/2017   ------------------------------------------------------------------------------------------------------------------ No results for input(s): TSH, T4TOTAL, T3FREE, THYROIDAB in the last 72 hours.  Invalid input(s): FREET3 ------------------------------------------------------------------------------------------------------------------ No results for input(s): VITAMINB12, FOLATE, FERRITIN, TIBC, IRON, RETICCTPCT in the last 72 hours.  Coagulation profile Recent Labs  Lab 08/10/19 2100  INR 1.1    No results for input(s):  DDIMER in the last 72 hours.  Cardiac Enzymes No results for input(s): CKMB, TROPONINI, MYOGLOBIN in the last 168 hours.  Invalid input(s): CK ------------------------------------------------------------------------------------------------------------------    Component Value Date/Time   BNP 33.4 03/30/2017 0505    Micro Results Recent Results (from the past 240 hour(s))  Urine culture     Status: Abnormal   Collection Time: 08/10/19  8:42 PM   Specimen: Urine, Clean Catch  Result Value Ref Range Status   Specimen Description   Final    URINE, CLEAN CATCH Performed at [facility], [address], [place], [place] [address]    Special Requests   Final    NONE Performed at [facility], [address] [place], [place] [address]    Culture 70,000 COLONIES/mL ESCHERICHIA COLI (A)  Final   Report Status 08/12/2019 FINAL  Final   Organism ID, Bacteria ESCHERICHIA COLI (A)  Final      Susceptibility   Escherichia coli - MIC*    AMPICILLIN <=2 SENSITIVE Sensitive     CEFAZOLIN <=4 SENSITIVE Sensitive     CEFTRIAXONE <=0.25 SENSITIVE Sensitive     CIPROFLOXACIN <=0.25 SENSITIVE Sensitive     GENTAMICIN <=1 SENSITIVE Sensitive     IMIPENEM <=0.25 SENSITIVE Sensitive     NITROFURANTOIN <=16 SENSITIVE Sensitive     TRIMETH/SULFA <=20 SENSITIVE Sensitive     AMPICILLIN/SULBACTAM <=2 SENSITIVE Sensitive     PIP/TAZO <=4 SENSITIVE Sensitive     * 70,000 COLONIES/mL ESCHERICHIA COLI  Blood Culture (routine x 2)     Status: Abnormal   Collection Time: 08/10/19  9:11 PM   Specimen: BLOOD  Result Value Ref Range Status   Specimen Description   Final    BLOOD LEFT ANTECUBITAL Performed at [facility], [address], [place], [place] [address]    Special Requests   Final    BOTTLES DRAWN AEROBIC AND ANAEROBIC Blood Culture adequate volume Performed at [facility], [address], [place], [place]  [address]    Culture  Setup Time   Final    AEROBIC BOTTLE ONLY GRAM NEGATIVE RODS CRITICAL VALUE NOTED.  VALUE IS CONSISTENT WITH PREVIOUSLY REPORTED AND CALLED VALUE.    Culture (A)  Final    ESCHERICHIA COLI SUSCEPTIBILITIES PERFORMED ON PREVIOUS CULTURE WITHIN THE LAST 5 DAYS. Performed at [facility], [address] [place], [place] [address]    Report Status 08/13/2019 FINAL  Final  Blood Culture (routine x 2)     Status: Abnormal   Collection Time: 08/10/19  9:11 PM   Specimen: BLOOD  Result Value Ref Range Status   Specimen Description   Final    BLOOD LEFT ANTECUBITAL Performed at [facility], [address] [place], [place] [address]    Special Requests   Final    BOTTLES DRAWN AEROBIC AND ANAEROBIC Blood Culture results may not be optimal due to an excessive volume of blood received in culture bottles Performed at [facility], [address], [place], [place] [address]    Culture  Setup Time   Final    ANAEROBIC BOTTLE ONLY GRAM NEGATIVE RODS CRITICAL RESULT CALLED TO, READ BACK BY AND VERIFIED WITHEarlean Shawl [facility] 9449 08/11/19 A BROWNING Performed at [facility], [address], [place], [place] [address]    Culture ESCHERICHIA COLI (A)  Final   Report Status 08/13/2019 FINAL  Final   Organism ID, Bacteria ESCHERICHIA COLI  Final      Susceptibility   Escherichia coli - MIC*    AMPICILLIN <=2 SENSITIVE Sensitive     CEFAZOLIN <=4 SENSITIVE Sensitive     CEFEPIME <=0.12 SENSITIVE Sensitive     CEFTAZIDIME <=1 SENSITIVE Sensitive     CEFTRIAXONE <=0.25 SENSITIVE Sensitive     CIPROFLOXACIN <=0.25 SENSITIVE Sensitive     GENTAMICIN <=1 SENSITIVE Sensitive     IMIPENEM <=0.25 SENSITIVE Sensitive     TRIMETH/SULFA <=20 SENSITIVE Sensitive     AMPICILLIN/SULBACTAM <=2 SENSITIVE Sensitive     PIP/TAZO <=4 SENSITIVE Sensitive     * ESCHERICHIA COLI  Blood Culture ID Panel (Reflexed)     Status: Abnormal    Collection Time: 08/10/19  9:11 PM  Result Value Ref Range Status   Enterococcus species NOT DETECTED NOT DETECTED Final   Listeria monocytogenes NOT DETECTED NOT DETECTED Final   Staphylococcus species NOT DETECTED NOT DETECTED Final   Staphylococcus aureus (BCID) NOT DETECTED NOT DETECTED Final   Streptococcus species NOT DETECTED NOT DETECTED Final   Streptococcus agalactiae NOT DETECTED NOT DETECTED Final   Streptococcus pneumoniae NOT DETECTED NOT DETECTED Final   Streptococcus pyogenes NOT DETECTED NOT DETECTED Final   Acinetobacter baumannii NOT DETECTED NOT DETECTED Final   Enterobacteriaceae species DETECTED (A) NOT DETECTED Final    Comment: Enterobacteriaceae represent a large family of gram-negative bacteria, not a single organism. CRITICAL RESULT CALLED TO, READ BACK BY AND VERIFIED WITH: Earlean Shawl PHARMD [id number] 08/11/19 A BROWNING    Enterobacter cloacae complex NOT DETECTED NOT DETECTED Final   Escherichia coli DETECTED (A) NOT DETECTED Final    Comment: CRITICAL RESULT CALLED TO, READ BACK BY AND VERIFIED WITH: Earlean Shawl PHARMD [phone] 08/11/19 A BROWNING    Klebsiella oxytoca NOT DETECTED NOT DETECTED Final   Klebsiella pneumoniae NOT DETECTED NOT DETECTED Final   Proteus species NOT DETECTED NOT DETECTED Final   Serratia marcescens NOT DETECTED NOT DETECTED Final   Carbapenem resistance NOT DETECTED NOT DETECTED Final   Haemophilus influenzae NOT DETECTED NOT DETECTED Final   Neisseria meningitidis NOT DETECTED NOT DETECTED Final   Pseudomonas aeruginosa NOT DETECTED NOT DETECTED Final   Candida albicans NOT DETECTED NOT DETECTED Final   Candida glabrata NOT DETECTED NOT DETECTED Final   Candida krusei NOT DETECTED NOT DETECTED Final   Candida parapsilosis NOT DETECTED NOT  DETECTED Final   Candida tropicalis NOT DETECTED NOT DETECTED Final    Comment: Performed at [facility], [address] [place], [place] [address]  SARS Coronavirus 2 by RT PCR (hospital order,  performed in [facility] hospital lab) Nasopharyngeal Nasopharyngeal Swab     Status: None   Collection Time: 08/10/19  9:40 PM   Specimen: Nasopharyngeal Swab  Result Value Ref Range Status   SARS Coronavirus 2 NEGATIVE NEGATIVE Final    Comment: (NOTE) SARS-CoV-2 target nucleic acids are NOT DETECTED.  The SARS-CoV-2 RNA is generally detectable in upper and lower respiratory specimens during the acute phase of infection. The lowest concentration of SARS-CoV-2 viral copies this assay can detect is 250 copies / mL. A negative result does not preclude SARS-CoV-2 infection and should not be used as the sole basis for treatment or other patient management decisions.  A negative result may occur with improper specimen collection / handling, submission of specimen other than nasopharyngeal swab, presence of viral mutation(s) within the areas targeted by this assay, and inadequate number of viral copies (<250 copies / mL). A negative result must be combined with clinical observations, patient history, and epidemiological information.  Fact Sheet for Patients:   BoilerBrush.com.cy  Fact Sheet for Healthcare Providers: https://pope.com/  This test is not yet approved or  cleared by the [place] FDA and has been authorized for detection and/or diagnosis of SARS-CoV-2 by FDA under an Emergency Use Authorization (EUA).  This EUA will remain in effect (meaning this test can be used) for the duration of the COVID-19 declaration under Section 564(b)(1) of the Act, 21 U.S.C. section 360bbb-3(b)(1), unless the authorization is terminated or revoked sooner.  Performed at [facility], [address], [place], [place] [address]   Wet prep, genital     Status: Abnormal   Collection Time: 08/10/19 11:00 PM   Specimen: PATH Cytology Cervicovaginal Ancillary Only  Result Value Ref Range Status   Yeast Wet Prep HPF POC NONE SEEN  NONE SEEN Final   Trich, Wet Prep NONE SEEN NONE SEEN Final   Clue Cells Wet Prep HPF POC PRESENT (A) NONE SEEN Final   WBC, Wet Prep HPF POC PRESENT (A) NONE SEEN Final   Sperm NONE SEEN  Final    Comment: Performed at [facility], [address] [place], [place] [address]  Culture, blood (routine x 2)     Status: None (Preliminary result)   Collection Time: 08/12/19  4:42 AM   Specimen: BLOOD LEFT WRIST  Result Value Ref Range Status   Specimen Description   Final    BLOOD LEFT WRIST Performed at [facility], [address], [place], [place] [address]    Special Requests   Final    BOTTLES DRAWN AEROBIC ONLY Blood Culture results may not be optimal due to an inadequate volume of blood received in culture bottles Performed at [facility], [address] [place], [place] [address]    Culture   Final    NO GROWTH < 24 HOURS Performed at [facility], [address] [place], [place] [address]    Report Status PENDING  Incomplete  Culture, blood (routine x 2)     Status: None (Preliminary result)   Collection Time: 08/12/19  4:42 AM   Specimen: BLOOD  Result Value Ref Range Status   Specimen Description   Final    BLOOD BLOOD RIGHT ARM Performed at [facility], [address] [place], [place] [address]    Special Requests   Final    BOTTLES DRAWN AEROBIC ONLY Blood Culture results may not be optimal due to an inadequate volume of blood received in culture bottles Performed at [facility], [address] [place], [place] [address]    Culture   Final    NO GROWTH < 24 HOURS Performed at [facility], [address] [place], [place] [address]    Report Status PENDING  Incomplete  MRSA PCR Screening     Status: None   Collection Time: 08/12/19  2:27 PM   Specimen: Nasal Mucosa; Nasopharyngeal  Result Value Ref Range Status   MRSA by PCR NEGATIVE NEGATIVE Final    Comment:         The GeneXpert MRSA Assay (FDA approved for NASAL specimens only), is one component of a comprehensive MRSA colonization surveillance program. It is not intended to diagnose MRSA infection nor to guide or monitor treatment for MRSA infections. Performed at [facility], [address] [place], [place] [address]     Radiology Reports DG Chest 2 View  Result Date: 08/11/2019 CLINICAL DATA:  Hyper emesis of pregnancy. EXAM: CHEST - 2 VIEW COMPARISON:  None. FINDINGS: Very mild linear atelectasis is seen within the bilateral lung bases. There is no evidence of a pleural effusion or pneumothorax. The heart size and mediastinal contours are within normal limits. The visualized skeletal structures are unremarkable. IMPRESSION: Very mild bibasilar linear atelectasis. Electronically Signed   By: Aram Candelahaddeus  Houston M.D.   On: 08/11/2019 18:33   DG Chest 2 View  Result Date: 08/10/2019 CLINICAL DATA:  Infection EXAM: CHEST - 2 VIEW COMPARISON:  07/03/2016 FINDINGS: The heart size and mediastinal contours are within normal limits. Both lungs are clear. The visualized skeletal structures are unremarkable. IMPRESSION: No active cardiopulmonary disease. Electronically Signed   By: Helyn NumbersAshesh  Parikh MD   On: 08/10/2019 22:05   CT CHEST W CONTRAST  Result Date: 08/12/2019 CLINICAL DATA:  Fever, gram-negative bacteremia EXAM: CT CHEST WITH CONTRAST TECHNIQUE: Multidetector CT imaging of the chest was performed during intravenous contrast administration. CONTRAST:  75mL OMNIPAQUE IOHEXOL 300 MG/ML  SOLN COMPARISON:  CT 03/30/2017 FINDINGS: Cardiovascular: Normal heart size. No pericardial effusion. The aorta is normal caliber. No acute luminal abnormality. No periaortic stranding or hemorrhage. Shared origin of the brachiocephalic and left common carotid artery. Proximal great vessels are otherwise unremarkable. Central pulmonary arteries are normal caliber no large central filling defects  are present with more distal evaluation limited on this non tailored examination of the pulmonary arteries. No major venous abnormalities. Mediastinum/Nodes: Small amount of fatty stippling in the anterior mediastinum likely reflect a thymic remnant. No mediastinal fluid or gas. Normal thyroid gland and thoracic inlet. No acute abnormality of the trachea or esophagus. No worrisome mediastinal, hilar or axillary adenopathy. Lungs/Pleura: Lower there are bandlike areas of opacity in the lung bases suggestive of subsegmental atelectasis and/or scarring. There are superimposed patchy consolidative and ground-glass opacities with few branching tree-in-bud nodularity most evident towards the superior segments of the lower lobes and in the posterior segment right upper lobe suggesting a concomitant infectious or inflammatory process. Trace bilateral pleural effusions are present as well. No pneumothorax minimal airways thickening. No concerning pulmonary nodules or masses. Upper Abdomen: 3.3 cm cystic focus in the posterior spleen with possible additional more ill-defined hypoattenuating focus just inferior (2/129). Appearance is indeterminate. No other acute  upper abdominal abnormality. Post cholecystectomy. Musculoskeletal: No acute osseous abnormality or suspicious osseous lesion. IMPRESSION: 1. Patchy consolidative and ground-glass opacities superimposed upon areas of more subsegmental atelectasis in the lower lobes and posterior segment right upper lobe may reflect an acute infection or inflammation on a background of volume loss. 2. Trace bilateral pleural effusions. 3. 3.3 cm cystic focus in the posterior spleen with additional more ill-defined hypoattenuating focus just inferior. Given the setting of bacteremia, a splenic abscess cannot be fully excluded. Consider further evaluation with MR imaging. Electronically Signed   By: Kreg Shropshire M.D.   On: 08/12/2019 19:47   CT ABDOMEN PELVIS W CONTRAST  Result Date:  08/10/2019 CLINICAL DATA:  Right lower quadrant abdominal pain over the last 2 days. EXAM: CT ABDOMEN AND PELVIS WITH CONTRAST TECHNIQUE: Multidetector CT imaging of the abdomen and pelvis was performed using the standard protocol following bolus administration of intravenous contrast. CONTRAST:  OMNIPAQUE IOHEXOL 300 MG/ML  SOLN COMPARISON:  04/04/2018 FINDINGS: Lower chest: Normal Hepatobiliary: Normal appearance of the liver parenchyma. Previous cholecystectomy. Pancreas: Normal Spleen: 2.8 cm cyst of the spleen, enlarged from 10 mm in March of 2020. Adrenals/Urinary Tract: Adrenal glands are normal. Kidneys are normal. No cyst, mass, stone or hydronephrosis. Bladder is normal. Stomach/Bowel: Stomach and small intestine are normal. Appendix as well seen as a normal structure in a retrocecal location. No acute colon pathology. Moderate amount of fecal matter. Vascular/Lymphatic: No vascular pathology. No retroperitoneal adenopathy. Reproductive: Uterus appears normal. Bilateral fallopian tube clips. No evidence of adnexal mass. Other: No free fluid or air. Previous Caesarean section scar. No evidence of hernia. 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 IMPRESSION: Normal appearing appendix. No abnormality seen to explain right lower quadrant pain. Moderate amount of fecal matter, but within the range of normal. Bilateral tubal clips, appearance unremarkable. 2.8 cm splenic cyst or pseudocyst, enlarged from 1 cm previously. Electronically Signed   By: Paulina Fusi M.D.   On: 08/10/2019 21:50   US Abdomen Limited RUQ  Result Date: 08/11/2019 CLINICAL DATA:  Right upper quadrant pain. EXAM: ULTRASOUND ABDOMEN LIMITED RIGHT UPPER QUADRANT COMPARISON:  CT abdomen pelvis from yesterday. FINDINGS: Gallbladder: Surgically absent. Common bile duct: Diameter: 3 mm, normal. Liver: No focal lesion identified. Mildly enlarged. Within normal limits in parenchymal echogenicity. Portal vein is patent on  color Doppler imaging with normal direction of blood flow towards the liver. Other: None. IMPRESSION: 1. No acute abnormality. 2. Mild hepatomegaly. Electronically Signed   By: Obie Dredge M.D.   On: 08/11/2019 19:43     Time Spent in minutes  30     Laverna Peace M.D on 08/13/2019 at 8:42 PM  To page go to [email or web address] - password [facility]

## 2019-08-13 NOTE — Progress Notes (Signed)
   08/13/19 1204  Vitals  Temp 98.9 F (37.2 C)  Temp Source Oral  BP (!) 138/99  MAP (mmHg) 111  BP Location Right Arm  BP Method Automatic  Patient Position (if appropriate) Lying  Pulse Rate 68  Pulse Rate Source Monitor  Resp 20  MEWS COLOR  MEWS Score Color Green  Oxygen Therapy  SpO2 99 %  O2 Device Nasal Cannula  O2 Flow Rate (L/min) 2 L/min  MEWS Score  MEWS Temp 0  MEWS Systolic 0  MEWS Pulse 0  MEWS RR 0  MEWS LOC 0  MEWS Score 0   Vital signs monitored. Patient recently administered AM medication including HCTZ 25 mg previously held due to being NPO for MRI. Will retake BP to determine effectiveness.

## 2019-08-13 NOTE — Progress Notes (Signed)
Patient was brought to MRI bay area to be screened. Patient has multiple facial and tongue piercings. The nose and double tongue piercing is titanium;however, the Eyebrow piercing is ferrous. There was multiple attempts by the patient, Susa Raring and myself to remove the piercing without any luck.  Patient cannot have MRI until that piercing is removed. Misty Stanley RN will contact MD. Please place another MRI order when the piercing is removed if MRI is still warranted.

## 2019-08-14 LAB — CBC WITH DIFFERENTIAL/PLATELET
Abs Immature Granulocytes: 0.04 10*3/uL (ref 0.00–0.07)
Basophils Absolute: 0 10*3/uL (ref 0.0–0.1)
Basophils Relative: 1 %
Eosinophils Absolute: 0.2 10*3/uL (ref 0.0–0.5)
Eosinophils Relative: 4 %
HCT: 34.8 % — ABNORMAL LOW (ref 36.0–46.0)
Hemoglobin: 10.6 g/dL — ABNORMAL LOW (ref 12.0–15.0)
Immature Granulocytes: 1 %
Lymphocytes Relative: 26 %
Lymphs Abs: 1.4 10*3/uL (ref 0.7–4.0)
MCH: 25 pg — ABNORMAL LOW (ref 26.0–34.0)
MCHC: 30.5 g/dL (ref 30.0–36.0)
MCV: 82.1 fL (ref 80.0–100.0)
Monocytes Absolute: 0.8 10*3/uL (ref 0.1–1.0)
Monocytes Relative: 15 %
Neutro Abs: 2.9 10*3/uL (ref 1.7–7.7)
Neutrophils Relative %: 53 %
Platelets: 177 10*3/uL (ref 150–400)
RBC: 4.24 MIL/uL (ref 3.87–5.11)
RDW: 15.6 % — ABNORMAL HIGH (ref 11.5–15.5)
WBC: 5.4 10*3/uL (ref 4.0–10.5)
nRBC: 0 % (ref 0.0–0.2)

## 2019-08-14 NOTE — Progress Notes (Signed)
Patient ambulated in room independently and in hall with assistance of front wheel walker with 1 person assist for safety. Patient provided self care in bathroom with set-up assistance only.

## 2019-08-14 NOTE — Progress Notes (Signed)
[facility]  PROGRESS NOTE  DAY GREB BPZ:[id number] DOB: [birth year] DOA: 08/10/2019 PCP: [facility] Admit date - 08/10/2019   Admitting Physician Bobette Mo, MD  Outpatient Primary MD for the patient is [facility]  LOS - 4 Brief Narrative   Jacqueline Clarke is a 34 y.o. year old female with medical history significant for HTN,  obesity and a remote cholecystectomy (10 years ago) for cholelithiasis , Hyperemesis of pregnancy, vitamin D deficiency, class II obesity who presented with 2 days of worsening right rib cage/flank pain with radiation to the right lower back associated with nausea, vomiting, fevers, chills/rigors, diminished appetite.  Patient was found to be septic with presumed etiology being UTI based off UA findings.  Hospital course: Still has persistent fever, admits to pain with swallowing, sore throat, tongue pain   Subjective  Today reports feeling much better.  Only mild neck pain that improved with position and warm compress.  No headache.  Still has a bit of residual right-sided abdominal pain but improved appetite.  A & P    Sepsis presumed UTI with E. coli bacteremia, but unclear etiology.  With addition of vancomycin to ceftriaxone has been afebrile for the past 24 hours.  Repeat blood cultures still unremarkable.  CT scan of chest Due to transient hypoxia and O2 requirements (now resolved) showed groundglass opacities in right upper lobe concerning for potential infection/pneumonia, and also notes 3.3 cm cystic focus in posterior spleen, query potential splenic abscess given the setting of bacteremia.  Patient unable to undergo MRI of abdomen due to facial piercings that cannot be removed, given no longer febrile for the past 48 hours doubt abscess, likely right-sided rib cage pain is a typical symptom related to groundglass opacity/pneumonia, though would not expect that to cause E. coli bacteremia -Given  clinical improvement on vancomycin and ceftriaxone we will continue to closely monitor -Monitor repeat blood cultures. -Follow repeat blood cultures -Incentive spirometer -We will discuss case with ID  Neck pain, reproducible with palpation of posterior neck, improved.  No meningismal signs on examination.  Likely muscle spasm related to positioning in bed -Flexeril as needed, warm compress, monitor  Hypertension,.  Diastolic blood pressure greater than 90s. -Given sepsis physiology essentially resolved, resume home amlodipine 10 mg -Continue home HCTZ  Obesity, class 2 BMI 39 -Dietary consult  Normocytic anemia, stable Hemoglobin 10.  11.5 on admission, likely hemoconcentration related to dehydration -Monitor CBC, no current signs or symptoms of bleeding  Oral thrush -Nystatin swish and swallow, monitor   Wet prep positive for clue cells, WBC Patient denies any vaginal discharge, vaginal irritation, or urinary symptoms -Flagyl discontinued  Hypocalcemia.  Albumin 3.5, corrected calcium 8.6, normal range 8.9 above.  Has history of vitamin D deficiency -Check ionized calcium -Check vitamin D level  Chronic back pain -Continue home Flexeril as needed -Continue home Percocet 10/325 mg p.o. every 4 hours as needed      Family Communication  : None  Code Status : Full code  Disposition Plan  :  Patient is from home. Anticipated d/c date: 2 to 3 days. Barriers to d/c or necessity for inpatient status:  IV ceftriaxone and vancomycin for E. coli bacteremia, currently of unclear etiology Consults  : None  Procedures  : None  DVT Prophylaxis  :  Lovenox  Lab Results  Component Value Date   PLT 177 08/14/2019    Diet :  Diet Order  Diet Heart Room service appropriate? Yes; Fluid consistency: Thin  Diet effective now                  Inpatient Medications Scheduled Meds: . amLODipine  10 mg Oral Daily  . enoxaparin (LOVENOX) injection  60 mg  Subcutaneous Q24H  . gabapentin  800 mg Oral TID  . hydrochlorothiazide  25 mg Oral Daily  . nystatin  5 mL Oral QID   Continuous Infusions: . cefTRIAXone (ROCEPHIN)  IV Stopped (08/13/19 2232)  . vancomycin 1,250 mg (08/14/19 1223)   PRN Meds:.acetaminophen **OR** [DISCONTINUED] acetaminophen, cyclobenzaprine, gadobutrol, HYDROmorphone (DILAUDID) injection, ondansetron **OR** ondansetron (ZOFRAN) IV, oxyCODONE-acetaminophen **AND** oxyCODONE, prochlorperazine  Antibiotics  :   Anti-infectives (From admission, onward)   Start     Dose/Rate Route Frequency Ordered Stop   08/12/19 2200  vancomycin (VANCOREADY) IVPB 1250 mg/250 mL     Discontinue     1,250 mg 166.7 mL/hr over 90 Minutes Intravenous Every 12 hours 08/12/19 0951     08/12/19 1000  vancomycin (VANCOREADY) IVPB 2000 mg/400 mL        2,000 mg 200 mL/hr over 120 Minutes Intravenous  Once 08/12/19 0844 08/12/19 1304   08/11/19 2200  cefTRIAXone (ROCEPHIN) 2 g in sodium chloride 0.9 % 100 mL IVPB     Discontinue     2 g 200 mL/hr over 30 Minutes Intravenous Every 24 hours 08/10/19 2355     08/11/19 0500  metroNIDAZOLE (FLAGYL) IVPB 500 mg  Status:  Discontinued        500 mg 100 mL/hr over 60 Minutes Intravenous Every 8 hours 08/10/19 2356 08/13/19 1255   08/10/19 2100  cefTRIAXone (ROCEPHIN) 2 g in sodium chloride 0.9 % 100 mL IVPB        2 g 200 mL/hr over 30 Minutes Intravenous  Once 08/10/19 2055 08/10/19 2146   08/10/19 2100  metroNIDAZOLE (FLAGYL) IVPB 500 mg        500 mg 100 mL/hr over 60 Minutes Intravenous  Once 08/10/19 2055 08/10/19 2244       Objective   Vitals:   08/13/19 1602 08/13/19 2046 08/14/19 0542 08/14/19 1407  BP: (!) 138/106 (!) 136/97 120/79 122/82  Pulse: 84 57 70 71  Resp: Temp: 98.6 F (37 C) 98.7 F (37.1 C) 98.8 F (37.1 C) 98 F (36.7 C)  TempSrc: Oral Oral Oral Oral  SpO2: 99% 97% 97% 97%  Weight:      Height:        SpO2: 97 % O2 Flow Rate (L/min): 20  L/min  Wt Readings from Last 3 Encounters:  08/10/19 127 kg  07/13/19 135.2 kg  02/18/19 131.5 kg     Intake/Output Summary (Last 24 hours) at 08/14/2019 1822 Last data filed at 08/14/2019 1600 Gross per 24 hour  Intake 1910 ml  Output --  Net 1910 ml    Physical Exam:     Awake Alert, Oriented X 3, normal affect No new F.N deficits,  [place].AT, Normal respiratory effort on room air, diminished breath sounds at bases Regular rate and rhythm, no peripheral edema,No Gallops,Rubs or new Murmurs, Diminished bowel sounds, abdomen soft, no rebound tenderness or guarding  right rib cage pain, reproducible on exam, no left abdominal pain No Cyanosis, No new Rash or bruise     I have personally reviewed the following:   Data Reviewed:  CBC Recent Labs  Lab 08/10/19 2027 08/11/19 0523 08/12/19 0442 08/13/19 0426 08/14/19  0540  WBC 9.2 6.7 5.6 7.7 5.4  HGB 11.5* 10.6* 9.6* 10.2* 10.6*  HCT 36.2 35.7* 31.6* 32.0* 34.8*  PLT 190 155 135* 155 177  MCV 80.6 86.0 84.5 80.0 82.1  MCH 25.6* 25.5* 25.7* 25.5* 25.0*  MCHC 31.8 29.7* 30.4 31.9 30.5  RDW 15.5 16.0* 15.9* 15.6* 15.6*  LYMPHSABS 1.2 1.0 1.1 2.2 1.4  MONOABS 1.0 0.7 0.7 1.2* 0.8  EOSABS 0.0 0.0 0.0 0.2 0.2  BASOSABS 0.0 0.0 0.0 0.0 0.0    Chemistries  Recent Labs  Lab 08/10/19 2027 08/11/19 0523 08/12/19 0442  NA 137 139 139  K 3.9 3.7 3.7  CL 101 110 106  CO2 26 19* 21*  GLUCOSE 98 88 93  BUN 10 9 6   CREATININE 1.10* 0.79 0.72  CALCIUM 8.2* 7.9* 7.7*  AST 20  --   --   ALT 28  --   --   ALKPHOS 119  --   --   BILITOT 1.1  --   --    ------------------------------------------------------------------------------------------------------------------ No results for input(s): CHOL, HDL, LDLCALC, TRIG, CHOLHDL, LDLDIRECT in the last 72 hours.  Lab Results  Component Value Date   HGBA1C 5.4 10/07/2017    ------------------------------------------------------------------------------------------------------------------ No results for input(s): TSH, T4TOTAL, T3FREE, THYROIDAB in the last 72 hours.  Invalid input(s): FREET3 ------------------------------------------------------------------------------------------------------------------ No results for input(s): VITAMINB12, FOLATE, FERRITIN, TIBC, IRON, RETICCTPCT in the last 72 hours.  Coagulation profile Recent Labs  Lab 08/10/19 2100  INR 1.1    No results for input(s): DDIMER in the last 72 hours.  Cardiac Enzymes No results for input(s): CKMB, TROPONINI, MYOGLOBIN in the last 168 hours.  Invalid input(s): CK ------------------------------------------------------------------------------------------------------------------    Component Value Date/Time   BNP 33.4 03/30/2017 0505    Micro Results Recent Results (from the past 240 hour(s))  Urine culture     Status: Abnormal   Collection Time: 08/10/19  8:42 PM   Specimen: Urine, Clean Catch  Result Value Ref Range Status   Specimen Description   Final    URINE, CLEAN CATCH Performed at [facility], [address], [place], [place]    Special Requests   Final    NONE Performed at [facility], [address], [place], [place]    Culture 70,000 COLONIES/mL ESCHERICHIA COLI (A)  Final   Report Status 08/12/2019 FINAL  Final   Organism ID, Bacteria ESCHERICHIA COLI (A)  Final      Susceptibility   Escherichia coli - MIC*    AMPICILLIN <=2 SENSITIVE Sensitive     CEFAZOLIN <=4 SENSITIVE Sensitive     CEFTRIAXONE <=0.25 SENSITIVE Sensitive     CIPROFLOXACIN <=0.25 SENSITIVE Sensitive     GENTAMICIN <=1 SENSITIVE Sensitive     IMIPENEM <=0.25 SENSITIVE Sensitive     NITROFURANTOIN <=16 SENSITIVE Sensitive     TRIMETH/SULFA <=20 SENSITIVE Sensitive     AMPICILLIN/SULBACTAM <=2 SENSITIVE Sensitive     PIP/TAZO <=4  SENSITIVE Sensitive     * 70,000 COLONIES/mL ESCHERICHIA COLI  Blood Culture (routine x 2)     Status: Abnormal   Collection Time: 08/10/19  9:11 PM   Specimen: BLOOD  Result Value Ref Range Status   Specimen Description   Final    BLOOD LEFT ANTECUBITAL Performed at [facility], [address], [place], [place]    Special Requests   Final    BOTTLES DRAWN AEROBIC AND ANAEROBIC Blood Culture adequate volume Performed at  [facility], [address] [place], [place] [address]    Culture  Setup Time   Final    AEROBIC BOTTLE ONLY GRAM NEGATIVE RODS CRITICAL VALUE NOTED.  VALUE IS CONSISTENT WITH PREVIOUSLY REPORTED AND CALLED VALUE.    Culture (A)  Final    ESCHERICHIA COLI SUSCEPTIBILITIES PERFORMED ON PREVIOUS CULTURE WITHIN THE LAST 5 DAYS. Performed at [facility], [address] [place], [place] [address]    Report Status 08/13/2019 FINAL  Final  Blood Culture (routine x 2)     Status: Abnormal   Collection Time: 08/10/19  9:11 PM   Specimen: BLOOD  Result Value Ref Range Status   Specimen Description   Final    BLOOD LEFT ANTECUBITAL Performed at [facility], [address] [place], [place] [address]    Special Requests   Final    BOTTLES DRAWN AEROBIC AND ANAEROBIC Blood Culture results may not be optimal due to an excessive volume of blood received in culture bottles Performed at [facility], [address] [place], [place] [address]    Culture  Setup Time   Final    ANAEROBIC BOTTLE ONLY GRAM NEGATIVE RODS CRITICAL RESULT CALLED TO, READ BACK BY AND VERIFIED WITHEarlean Shawl: J GADHIA [facility] [id number] 08/11/19 A BROWNING Performed at [facility], [address] [place], [place] [address]    Culture ESCHERICHIA COLI (A)  Final   Report Status 08/13/2019 FINAL  Final   Organism ID, Bacteria ESCHERICHIA COLI  Final      Susceptibility   Escherichia coli - MIC*    AMPICILLIN <=2  SENSITIVE Sensitive     CEFAZOLIN <=4 SENSITIVE Sensitive     CEFEPIME <=0.12 SENSITIVE Sensitive     CEFTAZIDIME <=1 SENSITIVE Sensitive     CEFTRIAXONE <=0.25 SENSITIVE Sensitive     CIPROFLOXACIN <=0.25 SENSITIVE Sensitive     GENTAMICIN <=1 SENSITIVE Sensitive     IMIPENEM <=0.25 SENSITIVE Sensitive     TRIMETH/SULFA <=20 SENSITIVE Sensitive     AMPICILLIN/SULBACTAM <=2 SENSITIVE Sensitive     PIP/TAZO <=4 SENSITIVE Sensitive     * ESCHERICHIA COLI  Blood Culture ID Panel (Reflexed)     Status: Abnormal   Collection Time: 08/10/19  9:11 PM  Result Value Ref Range Status   Enterococcus species NOT DETECTED NOT DETECTED Final   Listeria monocytogenes NOT DETECTED NOT DETECTED Final   Staphylococcus species NOT DETECTED NOT DETECTED Final   Staphylococcus aureus (BCID) NOT DETECTED NOT DETECTED Final   Streptococcus species NOT DETECTED NOT DETECTED Final   Streptococcus agalactiae NOT DETECTED NOT DETECTED Final   Streptococcus pneumoniae NOT DETECTED NOT DETECTED Final   Streptococcus pyogenes NOT DETECTED NOT DETECTED Final   Acinetobacter baumannii NOT DETECTED NOT DETECTED Final   Enterobacteriaceae species DETECTED (A) NOT DETECTED Final    Comment: Enterobacteriaceae represent a large family of gram-negative bacteria, not a single organism. CRITICAL RESULT CALLED TO, READ BACK BY AND VERIFIED WITH: Earlean ShawlJ GADHIA PHARMD [phone] 08/11/19 A BROWNING    Enterobacter cloacae complex NOT DETECTED NOT DETECTED Final   Escherichia coli DETECTED (A) NOT DETECTED Final    Comment: CRITICAL RESULT CALLED TO, READ BACK BY AND VERIFIED WITH: Earlean ShawlJ GADHIA PHARMD [phone] 08/11/19 A BROWNING    Klebsiella oxytoca NOT DETECTED NOT DETECTED Final   Klebsiella pneumoniae NOT DETECTED NOT DETECTED Final   Proteus species NOT DETECTED NOT DETECTED Final   Serratia marcescens NOT DETECTED NOT DETECTED Final  Carbapenem resistance NOT DETECTED NOT DETECTED Final   Haemophilus influenzae NOT DETECTED NOT  DETECTED Final   Neisseria meningitidis NOT DETECTED NOT DETECTED Final   Pseudomonas aeruginosa NOT DETECTED NOT DETECTED Final   Candida albicans NOT DETECTED NOT DETECTED Final   Candida glabrata NOT DETECTED NOT DETECTED Final   Candida krusei NOT DETECTED NOT DETECTED Final   Candida parapsilosis NOT DETECTED NOT DETECTED Final   Candida tropicalis NOT DETECTED NOT DETECTED Final    Comment: Performed at [facility], [address], [place], [place] [address]  SARS Coronavirus 2 by RT PCR (hospital order, performed in [facility] hospital lab) Nasopharyngeal Nasopharyngeal Swab     Status: None   Collection Time: 08/10/19  9:40 PM   Specimen: Nasopharyngeal Swab  Result Value Ref Range Status   SARS Coronavirus 2 NEGATIVE NEGATIVE Final    Comment: (NOTE) SARS-CoV-2 target nucleic acids are NOT DETECTED.  The SARS-CoV-2 RNA is generally detectable in upper and lower respiratory specimens during the acute phase of infection. The lowest concentration of SARS-CoV-2 viral copies this assay can detect is 250 copies / mL. A negative result does not preclude SARS-CoV-2 infection and should not be used as the sole basis for treatment or other patient management decisions.  A negative result may occur with improper specimen collection / handling, submission of specimen other than nasopharyngeal swab, presence of viral mutation(s) within the areas targeted by this assay, and inadequate number of viral copies (<250 copies / mL). A negative result must be combined with clinical observations, patient history, and epidemiological information.  Fact Sheet for Patients:   BoilerBrush.com.cy  Fact Sheet for Healthcare Providers: https://pope.com/  This test is not yet approved or  cleared by the [place] FDA and has been authorized for detection and/or diagnosis of SARS-CoV-2 by FDA under an Emergency Use Authorization (EUA).  This  EUA will remain in effect (meaning this test can be used) for the duration of the COVID-19 declaration under Section 564(b)(1) of the Act, 21 U.S.C. section 360bbb-3(b)(1), unless the authorization is terminated or revoked sooner.  Performed at [facility], [address] [place], [place] [address]   Wet prep, genital     Status: Abnormal   Collection Time: 08/10/19 11:00 PM   Specimen: PATH Cytology Cervicovaginal Ancillary Only  Result Value Ref Range Status   Yeast Wet Prep HPF POC NONE SEEN NONE SEEN Final   Trich, Wet Prep NONE SEEN NONE SEEN Final   Clue Cells Wet Prep HPF POC PRESENT (A) NONE SEEN Final   WBC, Wet Prep HPF POC PRESENT (A) NONE SEEN Final   Sperm NONE SEEN  Final    Comment: Performed at [facility], [address] [place], [place] [address]  Culture, blood (routine x 2)     Status: None (Preliminary result)   Collection Time: 08/12/19  4:42 AM   Specimen: BLOOD LEFT WRIST  Result Value Ref Range Status   Specimen Description   Final    BLOOD LEFT WRIST Performed at [facility], [address] [place], [place] [address]    Special Requests   Final    BOTTLES DRAWN AEROBIC ONLY Blood Culture results may not be optimal due to an inadequate volume of blood received in culture bottles Performed at [facility], [address] [place], [place] [address]    Culture   Final    NO GROWTH 2 DAYS Performed at [facility], [address] [place], [place] [address]    Report Status PENDING  Incomplete  Culture, blood (routine x 2)     Status: None (Preliminary result)   Collection Time: 08/12/19  4:42 AM   Specimen: BLOOD  Result Value Ref Range Status   Specimen Description   Final    BLOOD BLOOD RIGHT ARM Performed at [facility], [address] [place], [place] [address]    Special Requests   Final    BOTTLES DRAWN AEROBIC ONLY Blood Culture results may not be  optimal due to an inadequate volume of blood received in culture bottles Performed at [facility], [address], [place], [place] [address]    Culture   Final    NO GROWTH 2 DAYS Performed at [facility], [address] [place], [place] [address]    Report Status PENDING  Incomplete  MRSA PCR Screening     Status: None   Collection Time: 08/12/19  2:27 PM   Specimen: Nasal Mucosa; Nasopharyngeal  Result Value Ref Range Status   MRSA by PCR NEGATIVE NEGATIVE Final    Comment:        The GeneXpert MRSA Assay (FDA approved for NASAL specimens only), is one component of a comprehensive MRSA colonization surveillance program. It is not intended to diagnose MRSA infection nor to guide or monitor treatment for MRSA infections. Performed at [facility], [address], [place], [place] [address]     Radiology Reports DG Chest 2 View  Result Date: 08/11/2019 CLINICAL DATA:  Hyper emesis of pregnancy. EXAM: CHEST - 2 VIEW COMPARISON:  None. FINDINGS: Very mild linear atelectasis is seen within the bilateral lung bases. There is no evidence of a pleural effusion or pneumothorax. The heart size and mediastinal contours are within normal limits. The visualized skeletal structures are unremarkable. IMPRESSION: Very mild bibasilar linear atelectasis. Electronically Signed   By: Aram Candela M.D.   On: 08/11/2019 18:33   DG Chest 2 View  Result Date: 08/10/2019 CLINICAL DATA:  Infection EXAM: CHEST - 2 VIEW COMPARISON:  07/03/2016 FINDINGS: The heart size and mediastinal contours are within normal limits. Both lungs are clear. The visualized skeletal structures are unremarkable. IMPRESSION: No active cardiopulmonary disease. Electronically Signed   By: Helyn Numbers MD   On: 08/10/2019 22:05   CT CHEST W CONTRAST  Result Date: 08/12/2019 CLINICAL DATA:  Fever, gram-negative bacteremia EXAM: CT CHEST WITH CONTRAST TECHNIQUE: Multidetector  CT imaging of the chest was performed during intravenous contrast administration. CONTRAST:  40mL OMNIPAQUE IOHEXOL 300 MG/ML  SOLN COMPARISON:  CT 03/30/2017 FINDINGS: Cardiovascular: Normal heart size. No pericardial effusion. The aorta is normal caliber. No acute luminal abnormality. No periaortic stranding or hemorrhage. Shared origin of the brachiocephalic and left common carotid artery. Proximal great vessels are otherwise unremarkable. Central pulmonary arteries are normal caliber no large central filling defects are present with more distal evaluation limited on this non tailored examination of the pulmonary arteries. No major venous abnormalities. Mediastinum/Nodes: Small amount of fatty stippling in the anterior mediastinum likely reflect a thymic remnant. No mediastinal fluid or gas. Normal thyroid gland and thoracic inlet. No acute abnormality of the trachea or esophagus. No worrisome mediastinal, hilar or axillary adenopathy. Lungs/Pleura: Lower there are bandlike areas of opacity in the lung bases suggestive of subsegmental atelectasis and/or scarring. There are superimposed patchy consolidative and ground-glass opacities with few branching tree-in-bud nodularity most evident towards the  superior segments of the lower lobes and in the posterior segment right upper lobe suggesting a concomitant infectious or inflammatory process. Trace bilateral pleural effusions are present as well. No pneumothorax minimal airways thickening. No concerning pulmonary nodules or masses. Upper Abdomen: 3.3 cm cystic focus in the posterior spleen with possible additional more ill-defined hypoattenuating focus just inferior (2/129). Appearance is indeterminate. No other acute upper abdominal abnormality. Post cholecystectomy. Musculoskeletal: No acute osseous abnormality or suspicious osseous lesion. IMPRESSION: 1. Patchy consolidative and ground-glass opacities superimposed upon areas of more subsegmental atelectasis in  the lower lobes and posterior segment right upper lobe may reflect an acute infection or inflammation on a background of volume loss. 2. Trace bilateral pleural effusions. 3. 3.3 cm cystic focus in the posterior spleen with additional more ill-defined hypoattenuating focus just inferior. Given the setting of bacteremia, a splenic abscess cannot be fully excluded. Consider further evaluation with MR imaging. Electronically Signed   By: Kreg Shropshire M.D.   On: 08/12/2019 19:47   CT ABDOMEN PELVIS W CONTRAST  Result Date: 08/10/2019 CLINICAL DATA:  Right lower quadrant abdominal pain over the last 2 days. EXAM: CT ABDOMEN AND PELVIS WITH CONTRAST TECHNIQUE: Multidetector CT imaging of the abdomen and pelvis was performed using the standard protocol following bolus administration of intravenous contrast. CONTRAST:  OMNIPAQUE IOHEXOL 300 MG/ML  SOLN COMPARISON:  04/04/2018 FINDINGS: Lower chest: Normal Hepatobiliary: Normal appearance of the liver parenchyma. Previous cholecystectomy. Pancreas: Normal Spleen: 2.8 cm cyst of the spleen, enlarged from 10 mm in March of 2020. Adrenals/Urinary Tract: Adrenal glands are normal. Kidneys are normal. No cyst, mass, stone or hydronephrosis. Bladder is normal. Stomach/Bowel: Stomach and small intestine are normal. Appendix as well seen as a normal structure in a retrocecal location. No acute colon pathology. Moderate amount of fecal matter. Vascular/Lymphatic: No vascular pathology. No retroperitoneal adenopathy. Reproductive: Uterus appears normal. Bilateral fallopian tube clips. No evidence of adnexal mass. Other: No free fluid or air. Previous Caesarean section scar. No evidence of hernia. 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 IMPRESSION: Normal appearing appendix. No abnormality seen to explain right lower quadrant pain. Moderate amount of fecal matter, but within the range of normal. Bilateral tubal clips, appearance unremarkable. 2.8 cm splenic cyst  or pseudocyst, enlarged from 1 cm previously. Electronically Signed   By: Paulina Fusi M.D.   On: 08/10/2019 21:50   US Abdomen Limited RUQ  Result Date: 08/11/2019 CLINICAL DATA:  Right upper quadrant pain. EXAM: ULTRASOUND ABDOMEN LIMITED RIGHT UPPER QUADRANT COMPARISON:  CT abdomen pelvis from yesterday. FINDINGS: Gallbladder: Surgically absent. Common bile duct: Diameter: 3 mm, normal. Liver: No focal lesion identified. Mildly enlarged. Within normal limits in parenchymal echogenicity. Portal vein is patent on color Doppler imaging with normal direction of blood flow towards the liver. Other: None. IMPRESSION: 1. No acute abnormality. 2. Mild hepatomegaly. Electronically Signed   By: Obie Dredge M.D.   On: 08/11/2019 19:43     Time Spent in minutes  30     Laverna Peace M.D on 08/14/2019 at 6:22 PM  To page go to [email or web address] - password [email or web address]

## 2019-08-15 DIAGNOSIS — A549 Gonococcal infection, unspecified: Secondary | ICD-10-CM | POA: Diagnosis present

## 2019-08-15 LAB — COMPREHENSIVE METABOLIC PANEL
ALT: 21 U/L (ref 0–44)
AST: 36 U/L (ref 15–41)
Albumin: 2.8 g/dL — ABNORMAL LOW (ref 3.5–5.0)
Alkaline Phosphatase: 108 U/L (ref 38–126)
Anion gap: 13 (ref 5–15)
BUN: 10 mg/dL (ref 6–20)
CO2: 27 mmol/L (ref 22–32)
Calcium: 8.8 mg/dL — ABNORMAL LOW (ref 8.9–10.3)
Chloride: 97 mmol/L — ABNORMAL LOW (ref 98–111)
Creatinine, Ser: 0.78 mg/dL (ref 0.44–1.00)
GFR calc Af Amer: >60 mL/min (ref >60)
GFR calc non Af Amer: >60 mL/min (ref >60)
Glucose, Bld: 95 mg/dL (ref 70–99)
Potassium: 3.3 mmol/L — ABNORMAL LOW (ref 3.5–5.1)
Sodium: 137 mmol/L (ref 135–145)
Total Bilirubin: 0.2 mg/dL — ABNORMAL LOW (ref 0.3–1.2)
Total Protein: 7 g/dL (ref 6.5–8.1)

## 2019-08-15 LAB — CBC WITH DIFFERENTIAL/PLATELET
Abs Immature Granulocytes: 0.06 10*3/uL (ref 0.00–0.07)
Basophils Absolute: 0 10*3/uL (ref 0.0–0.1)
Basophils Relative: 1 %
Eosinophils Absolute: 0.3 10*3/uL (ref 0.0–0.5)
Eosinophils Relative: 4 %
HCT: 35.8 % — ABNORMAL LOW (ref 36.0–46.0)
Hemoglobin: 11.4 g/dL — ABNORMAL LOW (ref 12.0–15.0)
Immature Granulocytes: 1 %
Lymphocytes Relative: 28 %
Lymphs Abs: 2.1 10*3/uL (ref 0.7–4.0)
MCH: 25.4 pg — ABNORMAL LOW (ref 26.0–34.0)
MCHC: 31.8 g/dL (ref 30.0–36.0)
MCV: 79.7 fL — ABNORMAL LOW (ref 80.0–100.0)
Monocytes Absolute: 0.7 10*3/uL (ref 0.1–1.0)
Monocytes Relative: 10 %
Neutro Abs: 4.3 10*3/uL (ref 1.7–7.7)
Neutrophils Relative %: 56 %
Platelets: 208 10*3/uL (ref 150–400)
RBC: 4.49 MIL/uL (ref 3.87–5.11)
RDW: 15.1 % (ref 11.5–15.5)
WBC: 7.5 10*3/uL (ref 4.0–10.5)
nRBC: 0 % (ref 0.0–0.2)

## 2019-08-15 MED ORDER — AMOXICILLIN-POT CLAVULANATE ER 1000-62.5 MG PO TB12
2.0000 | ORAL_TABLET | Freq: Two times a day (BID) | ORAL | 0 refills | Status: AC
Start: 2019-08-15 — End: 2019-08-17

## 2019-08-15 MED ORDER — AMOXICILLIN 500 MG PO CAPS
1000.0000 mg | ORAL_CAPSULE | Freq: Three times a day (TID) | ORAL | Status: AC
  Administered 2019-08-15: 1000 mg via ORAL
  Filled 2019-08-15: qty 2

## 2019-08-15 NOTE — Plan of Care (Signed)
  Problem: Education: Goal: Knowledge of General Education information will improve Description: Including pain rating scale, medication(s)/side effects and non-pharmacologic comfort measures 08/15/2019 1240 by Darreld Mclean, RN Outcome: Adequate for Discharge 08/15/2019 1143 by Darreld Mclean, RN Outcome: Progressing   Problem: Health Behavior/Discharge Planning: Goal: Ability to manage health-related needs will improve 08/15/2019 1240 by Darreld Mclean, RN Outcome: Adequate for Discharge 08/15/2019 1143 by Darreld Mclean, RN Outcome: Progressing   Problem: Clinical Measurements: Goal: Ability to maintain clinical measurements within normal limits will improve 08/15/2019 1240 by Darreld Mclean, RN Outcome: Adequate for Discharge 08/15/2019 1143 by Darreld Mclean, RN Outcome: Progressing Goal: Will remain free from infection 08/15/2019 1240 by Darreld Mclean, RN Outcome: Adequate for Discharge 08/15/2019 1143 by Darreld Mclean, RN Outcome: Progressing Goal: Diagnostic test results will improve 08/15/2019 1240 by Darreld Mclean, RN Outcome: Adequate for Discharge 08/15/2019 1143 by Darreld Mclean, RN Outcome: Progressing Goal: Respiratory complications will improve 08/15/2019 1240 by Darreld Mclean, RN Outcome: Adequate for Discharge 08/15/2019 1143 by Darreld Mclean, RN Outcome: Progressing Goal: Cardiovascular complication will be avoided 08/15/2019 1240 by Darreld Mclean, RN Outcome: Adequate for Discharge 08/15/2019 1143 by Darreld Mclean, RN Outcome: Progressing   Problem: Activity: Goal: Risk for activity intolerance will decrease 08/15/2019 1240 by Darreld Mclean, RN Outcome: Adequate for Discharge 08/15/2019 1143 by Darreld Mclean, RN Outcome: Progressing   Problem: Nutrition: Goal: Adequate nutrition will be maintained 08/15/2019 1240 by Darreld Mclean, RN Outcome: Adequate for Discharge 08/15/2019 1143 by Darreld Mclean, RN Outcome: Progressing   Problem: Coping: Goal: Level of anxiety will decrease 08/15/2019 1240 by Darreld Mclean, RN Outcome: Adequate for  Discharge 08/15/2019 1143 by Darreld Mclean, RN Outcome: Progressing   Problem: Elimination: Goal: Will not experience complications related to bowel motility 08/15/2019 1240 by Darreld Mclean, RN Outcome: Adequate for Discharge 08/15/2019 1143 by Darreld Mclean, RN Outcome: Progressing Goal: Will not experience complications related to urinary retention 08/15/2019 1240 by Darreld Mclean, RN Outcome: Adequate for Discharge 08/15/2019 1143 by Darreld Mclean, RN Outcome: Progressing   Problem: Pain Managment: Goal: General experience of comfort will improve 08/15/2019 1240 by Darreld Mclean, RN Outcome: Adequate for Discharge 08/15/2019 1143 by Darreld Mclean, RN Outcome: Progressing   Problem: Safety: Goal: Ability to remain free from injury will improve 08/15/2019 1240 by Darreld Mclean, RN Outcome: Adequate for Discharge 08/15/2019 1143 by Darreld Mclean, RN Outcome: Progressing   Problem: Skin Integrity: Goal: Risk for impaired skin integrity will decrease 08/15/2019 1240 by Darreld Mclean, RN Outcome: Adequate for Discharge 08/15/2019 1143 by Darreld Mclean, RN Outcome: Progressing

## 2019-08-15 NOTE — Discharge Instructions (Signed)

## 2019-08-15 NOTE — Plan of Care (Signed)

## 2019-08-15 NOTE — Discharge Summary (Signed)
Jacqueline Clarke ZOX:[id number] DOB: [birth year] DOA: 08/10/2019  [facility]P: [facility]  Admit date: 08/10/2019 Discharge date: 08/15/2019  Admitted From: Home Disposition:  Home  Recommendations for Outpatient Follow-up:  1. Follow up with PCP in 1-2 weeks 2. Recommend repeating CT chest imaging to ensure resolution of ground glass opacities 3. Outpatient referral to psych, to discuss with PCP, not interested in restarting prior zoloft 4. Augmentin 500 mg (2 tabs) BID x 2 days   Home Health:none  Equipment/Devices: none Discharge Condition:Stable   CODE STATUS:FULL     Brief/Interim Summary: History of present illness:  Jacqueline Clarke is a 34 y.o. year old female with medical history significant for HTN,  obesity and a remote cholecystectomy (10 years ago) for cholelithiasis , Hyperemesis of pregnancy, vitamin D deficiency, class II obesity who presented with 2 days of worsening right rib cage/flank pain with radiation to the right lower back associated with nausea, vomiting, fevers, chills/rigors, diminished appetite.     Patient found to have a T-max of 102.9, tachypnea p, tachycardic to 113, blood pressure 140/97, WBC 6.7, hemoglobin 10.6.  Lab work initially noted for CO2 19, wet prep positive for clue cells WBC, gonorrhea positive, Covid negative.    Chest x-ray unremarkable b, CT abdomen nonacute with splenic cyst/pseudocyst.Blood cultures were obtained and patient was placed on IV ceftriaxone and IV Flagyl and admitted to [facility] hospital service  Patient was found to be septic with presumed etiology being UTI based off UA findings.  Hospital course: Hospital course complicated by persistent fevers despite ceftriaxone and Flagyl.  Right upper quadrant ultrasound obtained due to right-sided abdominal pain which showed no acute abnormality other than hepatomegaly.  Patient was broadened out to empiric vancomycin.  And further imaging CT scan of chest was obtained due  to transient hypoxia improved O2 requirements which showed groundglass opacities in right upper lobe concerning for potential infection/pneumonia, CT abdomen also showed 3.3 cm cystic focus in the posterior spleen, with query of potential splenic abscess given patient was found to have gram-negative bacteremia (E. coli).  Patient was unable to undergo MRI of abdomen for further evaluation explain due to facial piercings that were unable to be removed at that time.  Patient defervesced on empiric antibiotics.  Vancomycin discontinued given MRSA PCR screening was negative.  Remaining hospital course addressed in problem based format below:   Hospital Course:   Sepsis secondary to E. coli bacteremia and RUL pneumonia.  On presentation with E. coli bacteremia with unclear etiology given UA was fairly unremarkable, query of potential splenic abscess on CT abdomen, but was unable to obtain MRI due to unremovable facial piercings.  CT chest mentioned ground glass opacities in RUL.  Patient improved on IV ceftriaxone and was able to transition to oral with continued clinical improvement.  Did not need vancomycin given MRSA screen was negative.  And completed course of azithromycin forpresume dpna. -Completed 5 days of IV ceftriaxone, discharged to complete additional 3 days.  Augmentin extra strength for coverage of both pneumonia/E. coli bacteremia for 2 additional days on discharge -Patient instructed to follow-up closely with her PCP -Repeat blood cultures showed resolution of bacteremia prior to discharge  Wet prep positive for clue cells.  Patient without any vaginal discharge, vaginal irritation or urinary symptoms. -Patient was on empiric IV Flagyl during the initial period of hospitalization  Gonorrheal infection.  Chlamydia negative. Found on lab analysis while in ED. Patient did receive ceftriaxone during hospitalization given E.coli bacteremia above  Reproducible neck pain, resolved.  No  meningismal signs during hospitalization.  Improved with supportive care Flexeril as needed and warm compress. -Resolved prior to discharge.  Hypertension, stable -continue home HCTZ  Obesity, class II.  BMI 39. -Weight loss encouraged dietary consultation.  Hypocalcemia, resolved  Chronic back pain, Stable -Continue home Flexeril, Percocet on discharge   Consultations:  None  Procedures/Studies: None Subjective:  Discharge Exam: Vitals:   08/14/19 2119 08/15/19 0505  BP: (!) 120/94 (!) 117/87  Pulse: 98 82  Resp: 20 20  Temp: 98.6 F (37 C) 98.3 F (36.8 C)  SpO2: 95% 96%   Vitals:   08/14/19 0542 08/14/19 1407 08/14/19 2119 08/15/19 0505  BP: 120/79 122/82 (!) 120/94 (!) 117/87  Pulse: 70 71 98 82  Resp: 18 19 20 20   Temp: 98.8 F (37.1 C) 98 F (36.7 C) 98.6 F (37 C) 98.3 F (36.8 C)  TempSrc: Oral Oral Oral Oral  SpO2: 97% 97% 95% 96%  Weight:      Height:        Awake Alert, Oriented X 3, normal affect, resting comfortably No new F.N deficits,  [place].AT, Normal respiratory effort on room air, diminished breath sounds at bases Regular rate and rhythm, no peripheral edema,No Gallops,Rubs or new Murmurs, Diminished bowel sounds, abdomen soft, no rebound tenderness or guarding  No Cyanosis, No new Rash or bruise    Discharge Diagnoses:  Principal Problem:   Sepsis due to undetermined organism [facility]) Active Problems:   Normocytic anemia   Chronic back pain   Hypertension   Cough   Right lateral abdominal pain   Obesity (BMI 30-39.9)   Hypocalcemia   Neck pain   Bacteremia due to Gram-negative bacteria   Sepsis due to Escherichia coli (E. coli) (HCC)   Ground glass opacity present on imaging of lung    Discharge Instructions   Allergies as of 08/15/2019   No Known Allergies     Medication List    STOP taking these medications   amLODipine 10 MG tablet Commonly known as: NORVASC   ibuprofen 600 MG tablet Commonly known as: ADVIL    Ozempic (0.25 or 0.5 MG/DOSE) 2 MG/1.5ML Sopn Generic drug: Semaglutide(0.25 or 0.5MG /DOS)   phentermine 15 MG capsule   predniSONE 20 MG tablet Commonly known as: DELTASONE   senna-docusate 8.6-50 MG tablet Commonly known as: Senokot-S   sertraline 50 MG tablet Commonly known as: ZOLOFT   Vitafol Ultra 29-0.6-0.4-200 MG Caps     TAKE these medications   amoxicillin-clavulanate 1000-62.5 MG 12 hr tablet Commonly known as: Augmentin XR Take 2 tablets by mouth 2 (two) times daily for 2 days.   cyclobenzaprine 10 MG tablet Commonly known as: FLEXERIL Take 1 tablet (10 mg total) by mouth 2 (two) times daily as needed for muscle spasms.   gabapentin 800 MG tablet Commonly known as: NEURONTIN Take 800 mg by mouth 3 (three) times daily. What changed: Another medication with the same name was removed. Continue taking this medication, and follow the directions you see here.   hydrochlorothiazide 50 MG tablet Commonly known as: HYDRODIURIL Take 50 mg by mouth daily.   ketorolac 10 MG tablet Commonly known as: TORADOL Take 1 tablet (10 mg total) by mouth every 6 (six) hours as needed. What changed: reasons to take this   methocarbamol 500 MG tablet Commonly known as: ROBAXIN Take 1 tablet (500 mg total) by mouth 2 (two) times daily. What changed:   when to take this  reasons to take this   Oxycodone HCl 10 MG Tabs Take 10 mg by mouth 4 (four) times daily as needed (pain).   oxyCODONE-acetaminophen 10-325 MG tablet Commonly known as: PERCOCET Take 1 tablet by mouth 4 (four) times daily as needed for pain.       No Known Allergies      The results of significant diagnostics from this hospitalization (including imaging, microbiology, ancillary and laboratory) are listed below for reference.     Microbiology: Recent Results (from the past 240 hour(s))  Urine culture     Status: Abnormal   Collection Time: 08/10/19  8:42 PM   Specimen: Urine, Clean Catch   Result Value Ref Range Status   Specimen Description   Final    URINE, CLEAN CATCH Performed at [facility], [address], [place], [place] [address]    Special Requests   Final    NONE Performed at [facility], [address], [place], [place] [address]    Culture 70,000 COLONIES/mL ESCHERICHIA COLI (A)  Final   Report Status 08/12/2019 FINAL  Final   Organism ID, Bacteria ESCHERICHIA COLI (A)  Final      Susceptibility   Escherichia coli - MIC*    AMPICILLIN <=2 SENSITIVE Sensitive     CEFAZOLIN <=4 SENSITIVE Sensitive     CEFTRIAXONE <=0.25 SENSITIVE Sensitive     CIPROFLOXACIN <=0.25 SENSITIVE Sensitive     GENTAMICIN <=1 SENSITIVE Sensitive     IMIPENEM <=0.25 SENSITIVE Sensitive     NITROFURANTOIN <=16 SENSITIVE Sensitive     TRIMETH/SULFA <=20 SENSITIVE Sensitive     AMPICILLIN/SULBACTAM <=2 SENSITIVE Sensitive     PIP/TAZO <=4 SENSITIVE Sensitive     * 70,000 COLONIES/mL ESCHERICHIA COLI  Blood Culture (routine x 2)     Status: Abnormal   Collection Time: 08/10/19  9:11 PM   Specimen: BLOOD  Result Value Ref Range Status   Specimen Description   Final    BLOOD LEFT ANTECUBITAL Performed at [facility], [address] [place], [place] [address]    Special Requests   Final    BOTTLES DRAWN AEROBIC AND ANAEROBIC Blood Culture adequate volume Performed at [facility] [address], [place], [place] [address]    Culture  Setup Time   Final    AEROBIC BOTTLE ONLY GRAM NEGATIVE RODS CRITICAL VALUE NOTED.  VALUE IS CONSISTENT WITH PREVIOUSLY REPORTED AND CALLED VALUE.    Culture (A)  Final    ESCHERICHIA COLI SUSCEPTIBILITIES PERFORMED ON PREVIOUS CULTURE WITHIN THE LAST 5 DAYS. Performed at [facility], [address] [place], [place] [address]    Report Status 08/13/2019 FINAL  Final  Blood Culture (routine x 2)     Status: Abnormal   Collection Time: 08/10/19  9:11 PM    Specimen: BLOOD  Result Value Ref Range Status   Specimen Description   Final    BLOOD LEFT ANTECUBITAL Performed at [facility], [address], [place], [place] [address]    Special Requests   Final    BOTTLES DRAWN AEROBIC AND ANAEROBIC Blood Culture results may not be optimal due to an excessive volume of blood received in culture bottles Performed at [facility], [address], [place], [place] [address]    Culture  Setup Time   Final    ANAEROBIC BOTTLE ONLY GRAM NEGATIVE RODS CRITICAL RESULT CALLED TO, READ BACK BY AND VERIFIED WITH: J  Teodoro Kil [facility] 0981 08/11/19 A BROWNING Performed at [facility], [address], [place], [place] [address]    Culture ESCHERICHIA COLI (A)  Final   Report Status 08/13/2019 FINAL  Final   Organism ID, Bacteria ESCHERICHIA COLI  Final      Susceptibility   Escherichia coli - MIC*    AMPICILLIN <=2 SENSITIVE Sensitive     CEFAZOLIN <=4 SENSITIVE Sensitive     CEFEPIME <=0.12 SENSITIVE Sensitive     CEFTAZIDIME <=1 SENSITIVE Sensitive     CEFTRIAXONE <=0.25 SENSITIVE Sensitive     CIPROFLOXACIN <=0.25 SENSITIVE Sensitive     GENTAMICIN <=1 SENSITIVE Sensitive     IMIPENEM <=0.25 SENSITIVE Sensitive     TRIMETH/SULFA <=20 SENSITIVE Sensitive     AMPICILLIN/SULBACTAM <=2 SENSITIVE Sensitive     PIP/TAZO <=4 SENSITIVE Sensitive     * ESCHERICHIA COLI  Blood Culture ID Panel (Reflexed)     Status: Abnormal   Collection Time: 08/10/19  9:11 PM  Result Value Ref Range Status   Enterococcus species NOT DETECTED NOT DETECTED Final   Listeria monocytogenes NOT DETECTED NOT DETECTED Final   Staphylococcus species NOT DETECTED NOT DETECTED Final   Staphylococcus aureus (BCID) NOT DETECTED NOT DETECTED Final   Streptococcus species NOT DETECTED NOT DETECTED Final   Streptococcus agalactiae NOT DETECTED NOT DETECTED Final   Streptococcus pneumoniae NOT DETECTED NOT DETECTED Final   Streptococcus pyogenes  NOT DETECTED NOT DETECTED Final   Acinetobacter baumannii NOT DETECTED NOT DETECTED Final   Enterobacteriaceae species DETECTED (A) NOT DETECTED Final    Comment: Enterobacteriaceae represent a large family of gram-negative bacteria, not a single organism. CRITICAL RESULT CALLED TO, READ BACK BY AND VERIFIED WITH: Earlean Shawl PHARMD [id number] 08/11/19 A BROWNING    Enterobacter cloacae complex NOT DETECTED NOT DETECTED Final   Escherichia coli DETECTED (A) NOT DETECTED Final    Comment: CRITICAL RESULT CALLED TO, READ BACK BY AND VERIFIED WITH: Earlean Shawl PHARMD [id number] 08/11/19 A BROWNING    Klebsiella oxytoca NOT DETECTED NOT DETECTED Final   Klebsiella pneumoniae NOT DETECTED NOT DETECTED Final   Proteus species NOT DETECTED NOT DETECTED Final   Serratia marcescens NOT DETECTED NOT DETECTED Final   Carbapenem resistance NOT DETECTED NOT DETECTED Final   Haemophilus influenzae NOT DETECTED NOT DETECTED Final   Neisseria meningitidis NOT DETECTED NOT DETECTED Final   Pseudomonas aeruginosa NOT DETECTED NOT DETECTED Final   Candida albicans NOT DETECTED NOT DETECTED Final   Candida glabrata NOT DETECTED NOT DETECTED Final   Candida krusei NOT DETECTED NOT DETECTED Final   Candida parapsilosis NOT DETECTED NOT DETECTED Final   Candida tropicalis NOT DETECTED NOT DETECTED Final    Comment: Performed at [facility], [address], [place], [place] [address]  SARS Coronavirus 2 by RT PCR (hospital order, performed in [facility] hospital lab) Nasopharyngeal Nasopharyngeal Swab     Status: None   Collection Time: 08/10/19  9:40 PM   Specimen: Nasopharyngeal Swab  Result Value Ref Range Status   SARS Coronavirus 2 NEGATIVE NEGATIVE Final    Comment: (NOTE) SARS-CoV-2 target nucleic acids are NOT DETECTED.  The SARS-CoV-2 RNA is generally detectable in upper and lower respiratory specimens during the acute phase of infection. The lowest concentration of SARS-CoV-2 viral copies this assay can  detect is 250 copies / mL. A negative result does not preclude SARS-CoV-2 infection and should not be used as the sole basis for treatment or other patient management  decisions.  A negative result may occur with improper specimen collection / handling, submission of specimen other than nasopharyngeal swab, presence of viral mutation(s) within the areas targeted by this assay, and inadequate number of viral copies (<250 copies / mL). A negative result must be combined with clinical observations, patient history, and epidemiological information.  Fact Sheet for Patients:   BoilerBrush.com.cy  Fact Sheet for Healthcare Providers: https://pope.com/  This test is not yet approved or  cleared by the [place] FDA and has been authorized for detection and/or diagnosis of SARS-CoV-2 by FDA under an Emergency Use Authorization (EUA).  This EUA will remain in effect (meaning this test can be used) for the duration of the COVID-19 declaration under Section 564(b)(1) of the Act, 21 U.S.C. section 360bbb-3(b)(1), unless the authorization is terminated or revoked sooner.  Performed at [facility], [address], [place], [place] [address]   Wet prep, genital     Status: Abnormal   Collection Time: 08/10/19 11:00 PM   Specimen: PATH Cytology Cervicovaginal Ancillary Only  Result Value Ref Range Status   Yeast Wet Prep HPF POC NONE SEEN NONE SEEN Final   Trich, Wet Prep NONE SEEN NONE SEEN Final   Clue Cells Wet Prep HPF POC PRESENT (A) NONE SEEN Final   WBC, Wet Prep HPF POC PRESENT (A) NONE SEEN Final   Sperm NONE SEEN  Final    Comment: Performed at [facility], [address], [place], [place] [address]  Culture, blood (routine x 2)     Status: None (Preliminary result)   Collection Time: 08/12/19  4:42 AM   Specimen: BLOOD LEFT WRIST  Result Value Ref Range Status   Specimen Description   Final     BLOOD LEFT WRIST Performed at [facility], [address] [place], [place] [address]    Special Requests   Final    BOTTLES DRAWN AEROBIC ONLY Blood Culture results may not be optimal due to an inadequate volume of blood received in culture bottles Performed at [facility], [address] [place], [place] [address]    Culture   Final    NO GROWTH 2 DAYS Performed at [facility], [address], [place], [place] [address]    Report Status PENDING  Incomplete  Culture, blood (routine x 2)     Status: None (Preliminary result)   Collection Time: 08/12/19  4:42 AM   Specimen: BLOOD  Result Value Ref Range Status   Specimen Description   Final    BLOOD BLOOD RIGHT ARM Performed at [facility], [address], [place], [place] [address]    Special Requests   Final    BOTTLES DRAWN AEROBIC ONLY Blood Culture results may not be optimal due to an inadequate volume of blood received in culture bottles Performed at [facility], [address], [place], [place] [address]    Culture   Final    NO GROWTH 2 DAYS Performed at [facility], [address] [place], [place] [address]    Report Status PENDING  Incomplete  MRSA PCR Screening     Status: None   Collection Time: 08/12/19  2:27 PM   Specimen: Nasal Mucosa; Nasopharyngeal  Result Value Ref Range Status   MRSA by PCR NEGATIVE NEGATIVE Final    Comment:        The GeneXpert MRSA Assay (FDA approved for NASAL specimens only), is  one component of a comprehensive MRSA colonization surveillance program. It is not intended to diagnose MRSA infection nor to guide or monitor treatment for MRSA infections. Performed at [facility] [address] [place], [place] [address]      Labs: BNP (last 3 results) No results for input(s): BNP in the last 8760 hours. Basic Metabolic Panel: Recent Labs  Lab 08/10/19 2027 08/11/19 0523  08/12/19 0442 08/15/19 0503  NA 137 139 139 137  K 3.9 3.7 3.7 3.3*  CL 101 110 106 97*  CO2 26 19* 21* 27  GLUCOSE 98 88 93 95  BUN CREATININE 1.10* 0.79 0.72 0.78  CALCIUM 8.2* 7.9* 7.7* 8.8*   Liver Function Tests: Recent Labs  Lab 08/10/19 2027 08/15/19 0503  AST 20 36  ALT 28 21  ALKPHOS 119 108  BILITOT 1.1 0.2*  PROT 7.5 7.0  ALBUMIN 3.5 2.8*   No results for input(s): LIPASE, AMYLASE in the last 168 hours. No results for input(s): AMMONIA in the last 168 hours. CBC: Recent Labs  Lab 08/11/19 0523 08/12/19 0442 08/13/19 0426 08/14/19 0540 08/15/19 0503  WBC 6.7 5.6 7.7 5.4 7.5  NEUTROABS 5.0 3.7 3.9 2.9 4.3  HGB 10.6* 9.6* 10.2* 10.6* 11.4*  HCT 35.7* 31.6* 32.0* 34.8* 35.8*  MCV 86.0 84.5 80.0 82.1 79.7*  PLT 155 135* 155 177 208   Cardiac Enzymes: No results for input(s): CKTOTAL, CKMB, CKMBINDEX, TROPONINI in the last 168 hours. BNP: Invalid input(s): POCBNP CBG: No results for input(s): GLUCAP in the last 168 hours. D-Dimer No results for input(s): DDIMER in the last 72 hours. Hgb A1c No results for input(s): HGBA1C in the last 72 hours. Lipid Profile No results for input(s): CHOL, HDL, LDLCALC, TRIG, CHOLHDL, LDLDIRECT in the last 72 hours. Thyroid function studies No results for input(s): TSH, T4TOTAL, T3FREE, THYROIDAB in the last 72 hours.  Invalid input(s): FREET3 Anemia work up No results for input(s): VITAMINB12, FOLATE, FERRITIN, TIBC, IRON, RETICCTPCT in the last 72 hours. Urinalysis    Component Value Date/Time   COLORURINE YELLOW 08/10/2019 2027   APPEARANCEUR HAZY (A) 08/10/2019 2027   LABSPEC 1.012 08/10/2019 2027   PHURINE 9.0 (H) 08/10/2019 2027   GLUCOSEU NEGATIVE 08/10/2019 2027   HGBUR MODERATE (A) 08/10/2019 2027   BILIRUBINUR NEGATIVE 08/10/2019 2027   BILIRUBINUR negative 12/26/2015 1628   KETONESUR 5 (A) 08/10/2019 2027   PROTEINUR 30 (A) 08/10/2019 2027   UROBILINOGEN 0.2 11/09/2016 2114   NITRITE  NEGATIVE 08/10/2019 2027   LEUKOCYTESUR LARGE (A) 08/10/2019 2027   Sepsis Labs Invalid input(s): PROCALCITONIN,  WBC,  LACTICIDVEN Microbiology Recent Results (from the past 240 hour(s))  Urine culture     Status: Abnormal   Collection Time: 08/10/19  8:42 PM   Specimen: Urine, Clean Catch  Result Value Ref Range Status   Specimen Description   Final    URINE, CLEAN CATCH Performed at [facility], [address] [place], [place] [address]    Special Requests   Final    NONE Performed at [facility], [address] [place], [place] [address]    Culture 70,000 COLONIES/mL ESCHERICHIA COLI (A)  Final   Report Status 08/12/2019 FINAL  Final   Organism ID, Bacteria ESCHERICHIA COLI (A)  Final      Susceptibility   Escherichia coli - MIC*    AMPICILLIN <=2 SENSITIVE Sensitive     CEFAZOLIN <=4 SENSITIVE Sensitive  CEFTRIAXONE <=0.25 SENSITIVE Sensitive     CIPROFLOXACIN <=0.25 SENSITIVE Sensitive     GENTAMICIN <=1 SENSITIVE Sensitive     IMIPENEM <=0.25 SENSITIVE Sensitive     NITROFURANTOIN <=16 SENSITIVE Sensitive     TRIMETH/SULFA <=20 SENSITIVE Sensitive     AMPICILLIN/SULBACTAM <=2 SENSITIVE Sensitive     PIP/TAZO <=4 SENSITIVE Sensitive     * 70,000 COLONIES/mL ESCHERICHIA COLI  Blood Culture (routine x 2)     Status: Abnormal   Collection Time: 08/10/19  9:11 PM   Specimen: BLOOD  Result Value Ref Range Status   Specimen Description   Final    BLOOD LEFT ANTECUBITAL Performed at [facility], [address], [place], [place] [address]    Special Requests   Final    BOTTLES DRAWN AEROBIC AND ANAEROBIC Blood Culture adequate volume Performed at [facility], [address] [place], [place] [address]    Culture  Setup Time   Final    AEROBIC BOTTLE ONLY GRAM NEGATIVE RODS CRITICAL VALUE NOTED.  VALUE IS CONSISTENT WITH PREVIOUSLY REPORTED AND CALLED VALUE.    Culture (A)  Final     ESCHERICHIA COLI SUSCEPTIBILITIES PERFORMED ON PREVIOUS CULTURE WITHIN THE LAST 5 DAYS. Performed at [facility], [address] [place], [place] [address]    Report Status 08/13/2019 FINAL  Final  Blood Culture (routine x 2)     Status: Abnormal   Collection Time: 08/10/19  9:11 PM   Specimen: BLOOD  Result Value Ref Range Status   Specimen Description   Final    BLOOD LEFT ANTECUBITAL Performed at [facility], [address], [place], [place] [address]    Special Requests   Final    BOTTLES DRAWN AEROBIC AND ANAEROBIC Blood Culture results may not be optimal due to an excessive volume of blood received in culture bottles Performed at [facility], [address] [place], [place] [address]    Culture  Setup Time   Final    ANAEROBIC BOTTLE ONLY GRAM NEGATIVE RODS CRITICAL RESULT CALLED TO, READ BACK BY AND VERIFIED WITHEarlean Shawl [facility] 8250 08/11/19 A BROWNING Performed at [facility], [address], [place], [place] [address]    Culture ESCHERICHIA COLI (A)  Final   Report Status 08/13/2019 FINAL  Final   Organism ID, Bacteria ESCHERICHIA COLI  Final      Susceptibility   Escherichia coli - MIC*    AMPICILLIN <=2 SENSITIVE Sensitive     CEFAZOLIN <=4 SENSITIVE Sensitive     CEFEPIME <=0.12 SENSITIVE Sensitive     CEFTAZIDIME <=1 SENSITIVE Sensitive     CEFTRIAXONE <=0.25 SENSITIVE Sensitive     CIPROFLOXACIN <=0.25 SENSITIVE Sensitive     GENTAMICIN <=1 SENSITIVE Sensitive     IMIPENEM <=0.25 SENSITIVE Sensitive     TRIMETH/SULFA <=20 SENSITIVE Sensitive     AMPICILLIN/SULBACTAM <=2 SENSITIVE Sensitive     PIP/TAZO <=4 SENSITIVE Sensitive     * ESCHERICHIA COLI  Blood Culture ID Panel (Reflexed)     Status: Abnormal   Collection Time: 08/10/19  9:11 PM  Result Value Ref Range Status   Enterococcus species NOT DETECTED NOT DETECTED Final   Listeria monocytogenes NOT DETECTED NOT DETECTED Final   Staphylococcus  species NOT DETECTED NOT DETECTED Final   Staphylococcus aureus (BCID) NOT DETECTED NOT DETECTED Final   Streptococcus species NOT DETECTED NOT DETECTED Final   Streptococcus agalactiae NOT DETECTED NOT DETECTED  Final   Streptococcus pneumoniae NOT DETECTED NOT DETECTED Final   Streptococcus pyogenes NOT DETECTED NOT DETECTED Final   Acinetobacter baumannii NOT DETECTED NOT DETECTED Final   Enterobacteriaceae species DETECTED (A) NOT DETECTED Final    Comment: Enterobacteriaceae represent a large family of gram-negative bacteria, not a single organism. CRITICAL RESULT CALLED TO, READ BACK BY AND VERIFIED WITH: Earlean ShawlJ GADHIA PHARMD [phone] 08/11/19 A BROWNING    Enterobacter cloacae complex NOT DETECTED NOT DETECTED Final   Escherichia coli DETECTED (A) NOT DETECTED Final    Comment: CRITICAL RESULT CALLED TO, READ BACK BY AND VERIFIED WITH: Earlean ShawlJ GADHIA PHARMD [id number] 08/11/19 A BROWNING    Klebsiella oxytoca NOT DETECTED NOT DETECTED Final   Klebsiella pneumoniae NOT DETECTED NOT DETECTED Final   Proteus species NOT DETECTED NOT DETECTED Final   Serratia marcescens NOT DETECTED NOT DETECTED Final   Carbapenem resistance NOT DETECTED NOT DETECTED Final   Haemophilus influenzae NOT DETECTED NOT DETECTED Final   Neisseria meningitidis NOT DETECTED NOT DETECTED Final   Pseudomonas aeruginosa NOT DETECTED NOT DETECTED Final   Candida albicans NOT DETECTED NOT DETECTED Final   Candida glabrata NOT DETECTED NOT DETECTED Final   Candida krusei NOT DETECTED NOT DETECTED Final   Candida parapsilosis NOT DETECTED NOT DETECTED Final   Candida tropicalis NOT DETECTED NOT DETECTED Final    Comment: Performed at [facility], [address] [place], [place] [address]  SARS Coronavirus 2 by RT PCR (hospital order, performed in [facility] hospital lab) Nasopharyngeal Nasopharyngeal Swab     Status: None   Collection Time: 08/10/19  9:40 PM   Specimen: Nasopharyngeal Swab  Result Value Ref Range Status    SARS Coronavirus 2 NEGATIVE NEGATIVE Final    Comment: (NOTE) SARS-CoV-2 target nucleic acids are NOT DETECTED.  The SARS-CoV-2 RNA is generally detectable in upper and lower respiratory specimens during the acute phase of infection. The lowest concentration of SARS-CoV-2 viral copies this assay can detect is 250 copies / mL. A negative result does not preclude SARS-CoV-2 infection and should not be used as the sole basis for treatment or other patient management decisions.  A negative result may occur with improper specimen collection / handling, submission of specimen other than nasopharyngeal swab, presence of viral mutation(s) within the areas targeted by this assay, and inadequate number of viral copies (<250 copies / mL). A negative result must be combined with clinical observations, patient history, and epidemiological information.  Fact Sheet for Patients:   BoilerBrush.com.cyhttps://www.fda.gov/media/136312/download  Fact Sheet for Healthcare Providers: https://pope.com/https://www.fda.gov/media/136313/download  This test is not yet approved or  cleared by the [place] FDA and has been authorized for detection and/or diagnosis of SARS-CoV-2 by FDA under an Emergency Use Authorization (EUA).  This EUA will remain in effect (meaning this test can be used) for the duration of the COVID-19 declaration under Section 564(b)(1) of the Act, 21 U.S.C. section 360bbb-3(b)(1), unless the authorization is terminated or revoked sooner.  Performed at [facility], [address] [place], [place] [address]   Wet prep, genital     Status: Abnormal   Collection Time: 08/10/19 11:00 PM   Specimen: PATH Cytology Cervicovaginal Ancillary Only  Result Value Ref Range Status   Yeast Wet Prep HPF POC NONE SEEN NONE SEEN Final   Trich, Wet Prep NONE SEEN NONE SEEN Final   Clue Cells Wet Prep HPF POC PRESENT (A) NONE SEEN Final   WBC, Wet Prep HPF POC PRESENT (A) NONE  SEEN Final   Sperm NONE SEEN   Final    Comment: Performed at [facility], [address], [place], [place] [address]  Culture, blood (routine x 2)     Status: None (Preliminary result)   Collection Time: 08/12/19  4:42 AM   Specimen: BLOOD LEFT WRIST  Result Value Ref Range Status   Specimen Description   Final    BLOOD LEFT WRIST Performed at [facility], [address] [place], [place] [address]    Special Requests   Final    BOTTLES DRAWN AEROBIC ONLY Blood Culture results may not be optimal due to an inadequate volume of blood received in culture bottles Performed at [facility], [address], [place], [place] [address]    Culture   Final    NO GROWTH 2 DAYS Performed at [facility], [address], [place], [place] [address]    Report Status PENDING  Incomplete  Culture, blood (routine x 2)     Status: None (Preliminary result)   Collection Time: 08/12/19  4:42 AM   Specimen: BLOOD  Result Value Ref Range Status   Specimen Description   Final    BLOOD BLOOD RIGHT ARM Performed at [facility], [address], [place], [place] [address]    Special Requests   Final    BOTTLES DRAWN AEROBIC ONLY Blood Culture results may not be optimal due to an inadequate volume of blood received in culture bottles Performed at [facility], [address] [place], [place] [address]    Culture   Final    NO GROWTH 2 DAYS Performed at [facility], [address], [place], [place] [address]    Report Status PENDING  Incomplete  MRSA PCR Screening     Status: None   Collection Time: 08/12/19  2:27 PM   Specimen: Nasal Mucosa; Nasopharyngeal  Result Value Ref Range Status   MRSA by PCR NEGATIVE NEGATIVE Final    Comment:        The GeneXpert MRSA Assay (FDA approved for NASAL specimens only), is one component of a comprehensive MRSA colonization surveillance program. It is not intended to diagnose MRSA infection  nor to guide or monitor treatment for MRSA infections. Performed at [facility], [address], [place], [place] [address]      Time coordinating discharge: Over 30 minutes  SIGNED:   Laverna Peace, MD  [facility] 08/15/2019, 10:27 AM Pager   If 7PM-7AM, please contact night-coverage [email or web address] Password [email or web address]

## 2019-08-18 LAB — CULTURE, BLOOD (ROUTINE X 2)
Culture: NO GROWTH
Culture: NO GROWTH

## 2019-10-10 ENCOUNTER — Ambulatory Visit (INDEPENDENT_AMBULATORY_CARE_PROVIDER_SITE_OTHER): Payer: Medicaid Other | Admitting: Obstetrics & Gynecology

## 2019-10-10 ENCOUNTER — Other Ambulatory Visit: Payer: Self-pay

## 2019-10-10 ENCOUNTER — Encounter: Payer: Self-pay | Admitting: Obstetrics & Gynecology

## 2019-10-10 VITALS — BP 132/91 | HR 69 | Wt 274.0 lb

## 2019-10-10 DIAGNOSIS — E669 Obesity, unspecified: Secondary | ICD-10-CM

## 2019-10-10 DIAGNOSIS — Z9851 Tubal ligation status: Secondary | ICD-10-CM

## 2019-10-10 NOTE — Progress Notes (Signed)
RGYN patient presents for consult visit today. For Tubal reversal   Pt states she wants another baby.  [id number] Patient's last menstrual period was 10/06/2019 (exact date). Filshie clips used for PP BTL 03/2018. She requests referral for reversal procedure. We discussed the procedure including success or failure, tubal pregnancy, cost. Option of having IVF instead was explained. She accepts referral to Dr. April Manson.  20 min face to face and coordination of care, chart review  Adam Phenix, MD 10/10/2019  .

## 2019-10-10 NOTE — Patient Instructions (Signed)
Tubal Ligation Reversal  Tubal ligation is a procedure to close the fallopian tubes so a woman cannot get pregnant. Tubal ligation reversal is a procedure to undo a tubal ligation so that the woman can get pregnant again. During this procedure, the fallopian tubes are reopened, untied, or reconnected to separated sections. The ability to get pregnant again after having this procedure depends on:  Your age.  The type of tubal ligation that you had.  The length of your remaining fallopian tubes.  How healthy the tubes are.  The amount of scar tissue in your pelvic area.  Your partner's fertility. Tell a health care provider about:  Any allergies you have.  All medicines you are taking, including vitamins, herbs, eye drops, creams, and over-the-counter medicines.  Any problems you or family members have had with anesthetic medicines.  Any blood disorders you have.  Any surgeries you have had.  Any medical conditions you have.  Any past pregnancies. What are the risks? Generally, this is a safe procedure. However, problems may occur, including:  Failure to reverse the original procedure.  Future blockage of a reopened or reconnected fallopian tube.  Inability to get pregnant.  Ectopic pregnancy. This is a pregnancy in which a fertilized egg does not attach inside the uterus.  Infection.  Bleeding.  Damage to other structures or organs.  Blood clots in the legs and chest.  Allergic reactions to medicines. What happens before the procedure? Staying hydrated Follow instructions from your health care provider about hydration, which may include:  Up to 2 hours before the procedure - you may continue to drink clear liquids, such as water, clear fruit juice, black coffee, and plain tea. Eating and drinking restrictions Follow instructions from your health care provider about eating and drinking, which may include:  8 hours before the procedure - stop eating heavy meals  or foods such as meat, fried foods, or fatty foods.  6 hours before the procedure - stop eating light meals or foods, such as toast or cereal.  6 hours before the procedure - stop drinking milk or drinks that contain milk.  2 hours before the procedure - stop drinking clear liquids. General instructions  You and your partner may need to have a physical exam and blood and imaging tests to make sure that you do not have any unknown fertility problems.  Ask your health care provider about: ? Changing or stopping your regular medicines. This is especially important if you take diabetes medicines or blood thinners. ? Taking over-the-counter medicines, vitamins, herbs, and supplements. ? Taking medicines such as aspirin and ibuprofen. These medicines can thin your blood. Do not take these medicines unless your health care provider tells you to take them.  Plan to have someone take you home after the procedure.  If you go home right after the procedure, plan to have someone with you for 24 hours. What happens during the procedure?  To reduce your risk of infection: ? Your health care team will wash or sanitize their hands. ? Hair may be removed from the surgical area. ? Your skin will be washed with soap.  An IV will be inserted into one of your veins.  You will be given one or more of the following: ? A medicine to help you relax (sedative). ? A medicine that is injected into an area of your body that numbs everything below the injection site (regional anesthetic). ? A medicine that numbs the area (local anesthetic). ? A medicine   to make you fall asleep (general anesthetic).  A tube may be put down your throat to help you breathe.  Your bladder may be emptied with a small, thin tube (catheter).  Small cuts (incisions) will be made in your abdomen.  Your abdomen will be inflated with a gas. This will give the surgeon room to work and enable him or her to see your organs clearly.  A  thin, lighted tube with a camera (laparoscope) will be inserted through an incision into the pelvic area. Small instruments will be inserted through the other incision.  Any clips, rings, or clamps that were used to close your fallopian tubes will be removed, and any unconnected sections of your fallopian tubes will be reconnected.  Your fallopian tubes will be tested to make sure that they are open.  The incisions will be closed with stitches (sutures).  A bandage (dressing) will be placed over the incisions. The procedure may vary among health care providers and hospitals. What happens after the procedure?  Your blood pressure, heart rate, breathing rate, and blood oxygen level will be monitored until the medicines you were given have worn off.  You will be given medicine for pain, nausea, or vomiting as needed.  You will be encouraged to walk as soon as possible.  Do not drive for 24 hours if you received a sedative. Summary  Tubal ligation reversal is a procedure to undo a tubal ligation so that a woman can get pregnant again.  During this procedure, the fallopian tubes are reopened, untied, or reconnected to separated sections.  Follow instructions from your health care provider about eating and drinking before the procedure. This information is not intended to replace advice given to you by your health care provider. Make sure you discuss any questions you have with your health care provider. Document Revised: 12/19/2016 Document Reviewed: 04/17/2016 Elsevier Patient Education  2020 [employer].

## 2019-10-29 ENCOUNTER — Emergency Department (HOSPITAL_COMMUNITY): Payer: Medicaid Other

## 2019-10-29 ENCOUNTER — Encounter (HOSPITAL_COMMUNITY): Payer: Self-pay

## 2019-10-29 ENCOUNTER — Emergency Department (HOSPITAL_COMMUNITY)
Admission: EM | Admit: 2019-10-29 | Discharge: 2019-10-29 | Disposition: A | Discharge status: Home / Self Care | Payer: Medicaid Other | Attending: Emergency Medicine | Admitting: Emergency Medicine

## 2019-10-29 ENCOUNTER — Other Ambulatory Visit: Payer: Self-pay

## 2019-10-29 DIAGNOSIS — Z79899 Other long term (current) drug therapy: Secondary | ICD-10-CM | POA: Diagnosis not present

## 2019-10-29 DIAGNOSIS — R519 Headache, unspecified: Secondary | ICD-10-CM | POA: Diagnosis not present

## 2019-10-29 DIAGNOSIS — R410 Disorientation, unspecified: Secondary | ICD-10-CM | POA: Insufficient documentation

## 2019-10-29 DIAGNOSIS — Z20822 Contact with and (suspected) exposure to covid-19: Secondary | ICD-10-CM | POA: Diagnosis not present

## 2019-10-29 DIAGNOSIS — J45909 Unspecified asthma, uncomplicated: Secondary | ICD-10-CM | POA: Diagnosis not present

## 2019-10-29 DIAGNOSIS — I10 Essential (primary) hypertension: Secondary | ICD-10-CM | POA: Insufficient documentation

## 2019-10-29 DIAGNOSIS — Z87891 Personal history of nicotine dependence: Secondary | ICD-10-CM | POA: Insufficient documentation

## 2019-10-29 DIAGNOSIS — R1030 Lower abdominal pain, unspecified: Secondary | ICD-10-CM | POA: Diagnosis not present

## 2019-10-29 DIAGNOSIS — R569 Unspecified convulsions: Secondary | ICD-10-CM | POA: Insufficient documentation

## 2019-10-29 DIAGNOSIS — R400 Somnolence: Secondary | ICD-10-CM | POA: Diagnosis not present

## 2019-10-29 LAB — URINALYSIS, ROUTINE W REFLEX MICROSCOPIC
Bilirubin Urine: NEGATIVE
Glucose, UA: NEGATIVE mg/dL
Hgb urine dipstick: NEGATIVE
Ketones, ur: NEGATIVE mg/dL
Leukocytes,Ua: NEGATIVE
Nitrite: NEGATIVE
Protein, ur: NEGATIVE mg/dL
Specific Gravity, Urine: >1.046 — ABNORMAL HIGH (ref 1.005–1.030)
pH: 6 (ref 5.0–8.0)

## 2019-10-29 LAB — COMPREHENSIVE METABOLIC PANEL
ALT: 9 U/L (ref 0–44)
AST: 34 U/L (ref 15–41)
Albumin: 4.3 g/dL (ref 3.5–5.0)
Alkaline Phosphatase: 70 U/L (ref 38–126)
Anion gap: 11 (ref 5–15)
BUN: 21 mg/dL — ABNORMAL HIGH (ref 6–20)
CO2: 24 mmol/L (ref 22–32)
Calcium: 9 mg/dL (ref 8.9–10.3)
Chloride: 106 mmol/L (ref 98–111)
Creatinine, Ser: 1.33 mg/dL — ABNORMAL HIGH (ref 0.44–1.00)
GFR, Estimated: 53 mL/min — ABNORMAL LOW (ref >60)
Glucose, Bld: 78 mg/dL (ref 70–99)
Potassium: 5.1 mmol/L (ref 3.5–5.1)
Sodium: 141 mmol/L (ref 135–145)
Total Bilirubin: 1 mg/dL (ref 0.3–1.2)
Total Protein: 8 g/dL (ref 6.5–8.1)

## 2019-10-29 LAB — CBC WITH DIFFERENTIAL/PLATELET
Abs Immature Granulocytes: 0.04 10*3/uL (ref 0.00–0.07)
Basophils Absolute: 0 10*3/uL (ref 0.0–0.1)
Basophils Relative: 0 %
Eosinophils Absolute: 0.3 10*3/uL (ref 0.0–0.5)
Eosinophils Relative: 3 %
HCT: 38.7 % (ref 36.0–46.0)
Hemoglobin: 11.8 g/dL — ABNORMAL LOW (ref 12.0–15.0)
Immature Granulocytes: 0 %
Lymphocytes Relative: 32 %
Lymphs Abs: 3.2 10*3/uL (ref 0.7–4.0)
MCH: 26.2 pg (ref 26.0–34.0)
MCHC: 30.5 g/dL (ref 30.0–36.0)
MCV: 86 fL (ref 80.0–100.0)
Monocytes Absolute: 0.6 10*3/uL (ref 0.1–1.0)
Monocytes Relative: 5 %
Neutro Abs: 6.1 10*3/uL (ref 1.7–7.7)
Neutrophils Relative %: 60 %
Platelets: 266 10*3/uL (ref 150–400)
RBC: 4.5 MIL/uL (ref 3.87–5.11)
RDW: 16.4 % — ABNORMAL HIGH (ref 11.5–15.5)
WBC: 10.2 10*3/uL (ref 4.0–10.5)
nRBC: 0 % (ref 0.0–0.2)

## 2019-10-29 LAB — I-STAT BETA HCG BLOOD, ED (MC, WL, AP ONLY): I-stat hCG, quantitative: <5 m[IU]/mL (ref <5)

## 2019-10-29 LAB — RESPIRATORY PANEL BY RT PCR (FLU A&B, COVID)
Influenza A by PCR: NEGATIVE
Influenza B by PCR: NEGATIVE
SARS Coronavirus 2 by RT PCR: NEGATIVE

## 2019-10-29 LAB — LIPASE, BLOOD: Lipase: 26 U/L (ref 11–51)

## 2019-10-29 MED ORDER — LORAZEPAM 2 MG/ML IJ SOLN
1.0000 mg | Freq: Once | INTRAMUSCULAR | Status: AC
  Administered 2019-10-29: 1 mg via INTRAVENOUS
  Filled 2019-10-29: qty 1

## 2019-10-29 MED ORDER — SODIUM CHLORIDE (PF) 0.9 % IJ SOLN
INTRAMUSCULAR | Status: AC
  Filled 2019-10-29: qty 50

## 2019-10-29 MED ORDER — IOHEXOL 300 MG/ML  SOLN
100.0000 mL | Freq: Once | INTRAMUSCULAR | Status: AC | PRN
  Administered 2019-10-29: 100 mL via INTRAVENOUS

## 2019-10-29 NOTE — Discharge Instructions (Addendum)
Make sure you are getting plenty of rest, sleeping well and eating a regular diet.  Call the neurology office for follow-up care and to be seen about seizures.  Do not drive a vehicle, climb ladders, bathe or go swimming until your released, by the neurologist.

## 2019-10-29 NOTE — ED Provider Notes (Signed)
[facility]-EMERGENCY DEPT Provider Note   CSN: [id number] Arrival date & time: 10/29/19  1010     History Chief Complaint  Patient presents with  . Seizures  . Abdominal Pain    [facility] is a 34 y.o. female.  The history is provided by the patient and medical records. No language interpreter was used.  Seizures Seizure activity on arrival: no   Seizure type:  Unable to specify Preceding symptoms: nausea   Initial focality:  None Episode characteristics: no abnormal movements and no confusion   Postictal symptoms: confusion and somnolence   Return to baseline: no   Severity:  Moderate Timing:  Once Progression:  Unchanged Context: decreased sleep   Context: not alcohol withdrawal, not change in medication, not drug use and not fever   Recent head injury:  During the event PTA treatment:  None History of seizures: yes        Past Medical History:  Diagnosis Date  . Anemia   . Anxiety   . Asthma   . Bipolar disorder (HCC)   . Blood type, Rh negative   . Cholelithiasis   . Chronic back pain    scoliosis- on percocet  . Depression   . Headache(784.0)   . History of cocaine use   . History of ileus 07/06/2016   After cesarean section  . Hyperemesis complicating pregnancy, antepartum 2013  . 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 Low vitamin D level 01/03/2016  . Obesity   . Pregnancy induced hypertension   . Pyelonephritis 07/03/2016   postpartum  . Scoliosis   . Scoliosis   . Supervision of high risk pregnancy, antepartum 10/07/2017    Nursing Staff Provider Office Location  [facility] Dating   Language  English Anatomy US   Flu Vaccine  Declined 02/02/2018 Genetic Screen  NIPS:   AFP:   First Screen:  Quad:   TDaP vaccine   declined  02/02/2018 Hgb A1C or  GTT Early  Third trimester  Rhogam   02/02/2018   LAB RESULTS  Feeding Plan Breast/Bottle Blood Type O/Negative/-- (09/18 1525) ONeg  Contraception BTL? Antibody Negative (09/18    Patient Active Problem List   Diagnosis Date Noted  . Gonorrhea 08/15/2019  . Neck pain 08/13/2019  . Bacteremia due to Gram-negative bacteria 08/13/2019  . Ground glass opacity present on imaging of lung 08/13/2019  . Hypertension 08/11/2019  . Cough 08/11/2019  . Right lateral abdominal pain 08/11/2019  . Obesity (BMI 30-39.9) 08/11/2019  . Normocytic anemia 08/10/2019  . Chronic back pain 08/10/2019  . Thrombophlebitis 04/07/2018  . H/O: C-section 03/26/2018  . Traumatic injury during pregnancy 09/28/2017  . Adjustment insomnia 07/09/2016  . History of gestational hypertension 06/30/2016  . Morbid obesity (HCC)     Past Surgical History:  Procedure Laterality Date  . arm surgery    . CESAREAN SECTION N/A 06/23/2016   Procedure: CESAREAN SECTION;  Surgeon: [place] Bing, MD;  Location: [facility] BIRTHING SUITES;  Service: Obstetrics;  Laterality: N/A;  . CHOLECYSTECTOMY    . DILATION AND CURETTAGE OF UTERUS  2008  . MR WRIST RIGHT     sx to nerves  . NERVE, TENDON AND ARTERY REPAIR Right 06/18/2014   Procedure: NERVE, TENDON AND ARTERY REPAIR;  Surgeon: Bradly Bienenstock, MD;  Location: [facility] OR;  Service: Orthopedics;  Laterality: Right;  . TONSILLECTOMY AND ADENOIDECTOMY    . TUBAL LIGATION N/A 04/01/2018   Procedure: POST PARTUM  TUBAL LIGATION;  Surgeon: Tereso Newcomer, MD;  Location: [facility] LD ORS;  Service: Gynecology;  Laterality: N/A;  . VAGINAL DELIVERY  06/23/2016   Procedure: VAGINAL DELIVERY;  Surgeon: [place] Bing, MD;  Location: [facility] BIRTHING SUITES;  Service: Obstetrics;;     OB History    Gravida  8   Para  6   Term  5   Preterm  1   AB  2   Living  7     SAB  2   TAB  0   Ectopic  0   Multiple  1   Live Births  7           Family History  Problem Relation Age of Onset  . Heart disease Maternal Grandfather   . Diabetes Maternal Grandfather   . Heart disease Father   . Anesthesia problems Neg Hx      Social History   Tobacco Use  . Smoking status: Former Smoker    Packs/day: 0.25    Types: Cigarettes    Quit date: 08/02/2017    Years since quitting: 2.2  . Smokeless tobacco: Never Used  Vaping Use  . Vaping Use: Never used  Substance Use Topics  . Alcohol use: Not Currently    Alcohol/week: 0.0 standard drinks    Comment: Occassionally  . Drug use: Not Currently    Types: Cocaine    Comment: UDS + 11/22/17    Home Medications Prior to Admission medications   Medication Sig Start Date End Date Taking? Authorizing Provider  cyclobenzaprine (FLEXERIL) 10 MG tablet Take 1 tablet (10 mg total) by mouth 2 (two) times daily as needed for muscle spasms. 09/12/18   Milagros Loll, MD  etodolac (LODINE XL) 500 MG 24 hr tablet Take 500 mg by mouth daily. 09/02/19   [provider]  etodolac (LODINE XL) 600 MG 24 hr tablet Take 600 mg by mouth at bedtime. 10/05/19   [provider]  gabapentin (NEURONTIN) 800 MG tablet Take 800 mg by mouth 3 (three) times daily. 08/04/19   [provider]  hydrochlorothiazide (HYDRODIURIL) 50 MG tablet Take 50 mg by mouth daily. 08/04/19   [provider]  ketorolac (TORADOL) 10 MG tablet Take 1 tablet (10 mg total) by mouth every 6 (six) hours as needed. Patient taking differently: Take 10 mg by mouth every 6 (six) hours as needed for moderate pain.  05/05/18   Calvert Cantor, CNM  methocarbamol (ROBAXIN) 500 MG tablet Take 1 tablet (500 mg total) by mouth 2 (two) times daily. Patient taking differently: Take 500 mg by mouth every 6 (six) hours as needed for muscle spasms.  09/02/18   Fayrene Helper, PA-C  Oxycodone HCl 10 MG TABS Take 10 mg by mouth 4 (four) times daily as needed (pain).  07/08/19   [provider]  oxyCODONE-acetaminophen (PERCOCET) 10-325 MG tablet Take 1 tablet by mouth 4 (four) times daily as needed for pain.  08/05/19   [provider]    Allergies    Patient has no known  allergies.  Review of Systems   Review of Systems  Constitutional: Negative for chills, diaphoresis, fatigue and fever.  HENT: Negative for congestion.   Eyes: Negative for visual disturbance.  Respiratory: Negative for cough, chest tightness, shortness of breath and wheezing.   Cardiovascular: Negative for chest pain, palpitations and leg swelling.  Gastrointestinal: Positive for abdominal pain, nausea and vomiting. Negative for constipation and diarrhea.  Genitourinary: Negative for  decreased urine volume, dysuria, flank pain, frequency, vaginal bleeding, vaginal discharge and vaginal pain.  Musculoskeletal: Negative for back pain, neck pain and neck stiffness.  Neurological: Positive for seizures and headaches. Negative for dizziness, weakness and light-headedness.  Psychiatric/Behavioral: Negative for agitation.  All other systems reviewed and are negative.   Physical Exam Updated Vital Signs BP (!) 139/95 (BP Location: Left Arm)   Pulse 80   Temp 98.4 F (36.9 C) (Oral)   Resp 16   Ht 5\' 11"  (1.803 m)   Wt 124.3 kg   LMP 10/06/2019 (Exact Date)   SpO2 98%   BMI 38.22 kg/m   Physical Exam Vitals and nursing note reviewed.  Constitutional:      General: She is not in acute distress.    Appearance: She is well-developed. She is not ill-appearing, toxic-appearing or diaphoretic.  HENT:     Head: Normocephalic and atraumatic.      Nose: No congestion or rhinorrhea.     Mouth/Throat:     Mouth: Mucous membranes are moist.     Pharynx: No oropharyngeal exudate or posterior oropharyngeal erythema.  Eyes:     Conjunctiva/sclera: Conjunctivae normal.  Cardiovascular:     Rate and Rhythm: Normal rate and regular rhythm.     Pulses: Normal pulses.     Heart sounds: No murmur heard.   Pulmonary:     Effort: Pulmonary effort is normal. No respiratory distress.     Breath sounds: Normal breath sounds. No wheezing, rhonchi or rales.  Chest:     Chest wall: No tenderness.   Abdominal:     General: Abdomen is flat.     Palpations: Abdomen is soft.     Tenderness: There is abdominal tenderness in the right upper quadrant and epigastric area. There is no right CVA tenderness, left CVA tenderness, guarding or rebound.    Musculoskeletal:        General: Tenderness present.     Cervical back: Neck supple. No tenderness.     Right lower leg: No edema.     Left lower leg: No edema.  Skin:    General: Skin is warm and dry.     Coloration: Skin is not pale.     Findings: No erythema.  Neurological:     General: No focal deficit present.     Mental Status: She is alert.     GCS: GCS eye subscore is 4. GCS verbal subscore is 5. GCS motor subscore is 6.     Cranial Nerves: No dysarthria or facial asymmetry.     Sensory: No sensory deficit.     Motor: Tremor present. No weakness, abnormal muscle tone or seizure activity.     Coordination: Coordination normal. Finger-Nose-Finger Test normal.  Psychiatric:        Mood and Affect: Mood normal.     ED Results / Procedures / Treatments   Labs (all labs ordered are listed, but only abnormal results are displayed) Labs Reviewed  CBC WITH DIFFERENTIAL/PLATELET - Abnormal; Notable for the following components:      Result Value   Hemoglobin 11.8 (*)    RDW 16.4 (*)    All other components within normal limits  COMPREHENSIVE METABOLIC PANEL - Abnormal; Notable for the following components:   BUN 21 (*)    Creatinine, Ser 1.33 (*)    GFR, Estimated 53 (*)    All other components within normal limits  URINALYSIS, ROUTINE W REFLEX MICROSCOPIC - Abnormal; Notable for the following components:  Specific Gravity, Urine >1.046 (*)    All other components within normal limits  RESPIRATORY PANEL BY RT PCR (FLU A&B, COVID)  URINE CULTURE  CULTURE, BLOOD (ROUTINE X 2)  CULTURE, BLOOD (ROUTINE X 2)  LIPASE, BLOOD  I-STAT BETA HCG BLOOD, ED ([facility], [facility], [facility] ONLY)    EKG None  Radiology DG Chest 2 View  Result Date:  10/29/2019 CLINICAL DATA:  Patient with a seizure. EXAM: CHEST - 2 VIEW COMPARISON:  Chest radiograph 08/11/2019. FINDINGS: Monitoring leads overlie the patient. Stable enlarged cardiac and mediastinal contours. Low lung volumes. Bibasilar heterogeneous opacities. No pleural effusion or pneumothorax. Thoracic spine degenerative changes. IMPRESSION: Bibasilar opacities favored to represent atelectasis. Infection not excluded. Electronically Signed   By: Annia Belt M.D.   On: 10/29/2019 12:43   CT Head Wo Contrast  Result Date: 10/29/2019 CLINICAL DATA:  Seizure EXAM: CT HEAD WITHOUT CONTRAST TECHNIQUE: Contiguous axial images were obtained from the base of the skull through the vertex without intravenous contrast. COMPARISON:  December 24, 2018 FINDINGS: Brain: No evidence of acute infarction, hemorrhage, hydrocephalus, extra-axial collection or mass lesion/mass effect. Vascular: No hyperdense vessel or unexpected calcification. Skull: Normal. Negative for fracture or focal lesion. Sinuses/Orbits: No acute finding. Other: None. IMPRESSION: No acute intracranial abnormality. Electronically Signed   By: Meda Klinefelter MD   On: 10/29/2019 12:51   CT ABDOMEN PELVIS W CONTRAST  Result Date: 10/29/2019 CLINICAL DATA:  Severe upper abdominal pain. EXAM: CT ABDOMEN AND PELVIS WITH CONTRAST TECHNIQUE: Multidetector CT imaging of the abdomen and pelvis was performed using the standard protocol following bolus administration of intravenous contrast. CONTRAST:  OMNIPAQUE IOHEXOL 300 MG/ML  SOLN COMPARISON:  None. FINDINGS: Evaluation is limited secondary to suboptimal patient positioning. Lower chest: No acute abnormality.  Bibasilar atelectasis. Hepatobiliary: Status post cholecystectomy. Focal fatty deposition adjacent to the falciform ligament. No extrahepatic biliary ductal dilation. Pancreas: Unremarkable. No pancreatic ductal dilatation or surrounding inflammatory changes. Spleen: Mild increase in size of  a hypodense cystic lesion of the posterior spleen. It measures 3.4 cm, previously 2.8 cm. Adrenals/Urinary Tract: Adrenal glands are unremarkable. Punctate LEFT-sided nephrolithiasis. Kidneys enhance symmetrically. No hydronephrosis. Bladder is unremarkable. Stomach/Bowel: Stomach is within normal limits. Appendix appears normal. No evidence of bowel wall thickening, distention, or inflammatory changes. Moderate colonic stool burden. Vascular/Lymphatic: No significant vascular findings are present. No enlarged abdominal or pelvic lymph nodes. Reproductive: Uterus and bilateral adnexa are unremarkable. Tubal ligation clips. Other: No free air or free fluid. Musculoskeletal: No acute or significant osseous findings. IMPRESSION: 1. No acute abdominopelvic findings. 2. Moderate colonic stool burden. 3. Mild increase in size of a hypodense cystic lesion of the posterior spleen, nonspecific. It measures 3.4 cm, previously 2.8 cm. Electronically Signed   By: Meda Klinefelter MD   On: 10/29/2019 12:59    Procedures Procedures (including critical care time)  Medications Ordered in ED Medications  sodium chloride (PF) 0.9 % injection (  Not Given 10/29/19 1208)  LORazepam (ATIVAN) injection 1 mg (1 mg Intravenous Given 10/29/19 1120)  iohexol (OMNIPAQUE) 300 MG/ML solution 100 mL (100 mLs Intravenous Contrast Given 10/29/19 1150)    ED Course  I have reviewed the triage vital signs and the nursing notes.  Pertinent labs & imaging results that were available during my care of the patient were reviewed by me and considered in my medical decision making (see chart for details).    MDM Rules/Calculators/A&P  [facility] is a 34 y.o. female with a past medical history significant for bipolar disorder, anxiety, depression, chronic back pain on oxycodone, prior cholecystectomy, asthma, scoliosis, recent admission for sepsis with pyelonephritis and UTI source, and prior seizures  not on seizure medication who presents with 4 days of nausea and vomiting, 1 day of abdominal discomfort, not sleeping overnight with anxiety, and then a seizure with fall hitting her head with headache.  Patient reports that she has had nausea and vomiting felt dehydrated for the last 4 days.  She reports that she has had some abdominal pain this morning and went to the bathroom.  She reports she did not have constipation or diarrhea but after vomiting, stood up and felt like she was can have a seizure.  She then had a seizure and fell back hitting her head.  She is reporting severe occipital headache but denies any neck pain.  She denies any chest pain or shortness of breath but does report she feels similar to how she felt when she was septic with UTI several months ago requiring admission.  She denies any urinary symptoms or pelvic symptoms.  No constipation or diarrhea.  She is reporting pain in her epigastrium and right upper quadrant.  She describes that is moderate to severe.  She feels very fatigued and tired.  EMS reportedly found her with pinpoint pupils after her seizure episode.  Patient reports she did take an oxycodone today to help with her back pain which she normally takes.  She reports she is still having upper abdominal pain, headache, and feeling tired after the seizure.  On exam, lungs are clear and chest is nontender.  Abdomen is tender in the epigastrium and right upper quadrant.  Normal bowel sounds.  No lower abdominal tenderness on my exam.  Good pulses in extremities.  Patient has some tremors but denies focal neurologic deficits.  Patient reports that she has never had any alcohol withdrawal problems and last drank alcohol yesterday.  She reports did not sleep with her last night.  Clinically I suspect that the seizure is a seizure related to dehydration from her nausea and vomiting for several days as well as missing sleep overnight if she has a history of seizures.  We will have  her get CT of her head to look for intracranial injury, CT of her abdomen pelvis to look for a cause of her severe abdominal pain with nausea vomiting, and work-up for other signs of infection with chest x-ray and urinalysis.  We will get other screening labs we will give some fluids as her skin seems dry mucous membranes are dry on exam with her nausea and vomiting.  We will give a small dose of Ativan to help prevent further seizure activity but try not to sedate the patient fully.  Anticipate reassessment after work-up to determine disposition.        CT imaging and x-ray reassuring.  Labs began to return and were reassuring.  Vital signs also reassuring.  She is feeling much better after rehydration and monitoring.  I suspect she was dehydrated, had some alcohol yesterday, and did not sleep last night leading to a seizure.  She will need to follow-up with neurology and avoid seizure threshold lowering behaviors.  Care transferred to oncoming team while waiting for urinalysis to return.  If she is feeling better, dissipate discharge home with or without antibiotics for UTI.  Patient agrees with plan of care and patient care transferred in stable  condition.   Final Clinical Impression(s) / ED Diagnoses Final diagnoses:  Seizure (HCC)    Rx / DC Orders ED Discharge Orders    None     Clinical Impression: 1. Seizure [facility])     Disposition: Care transferred to Dr. Effie Shy while awaiting for results of urinalysis.  Anticipate discharge after as she is felt well and been monitored for over 8 hours without recurrent seizure or any other symptoms.  This note was prepared with assistance of Conservation officer, historic buildings. Occasional wrong-word or sound-a-like substitutions may have occurred due to the inherent limitations of voice recognition software.     Aldea Avis, Canary Brim, MD 10/29/19 2043

## 2019-10-29 NOTE — ED Provider Notes (Signed)
3:30 PM-checkout from Dr. Rush Landmark, regarding need for urinalysis prior to discharge.  Patient presented for evaluation of seizure which she has had previously but is not currently on antiepileptics.  This is reportedly her second seizure.  6:05 PM-urinalysis has returned and is negative.  At this time she is alert, calm and comfortable.  She has no further complaints.  She states that "a couple months ago" she had another seizure; she did not see a provider at that time.  Medical decision-reported seizure, not witnessed.  Reassuring evaluation, not requiring intervention.  No indication for starting antiepileptics at this time.  Patient referred to neurology for further evaluation and treatment   Mancel Bale, MD 10/29/19 1818

## 2019-10-29 NOTE — ED Triage Notes (Signed)
Pt came from home via EMS. Found in bathroom having a seizure. Hx of seizure. Most recent one was a month ago. Prescribed seizure meds but didn't take them. Emesis in the bathroom at home. Complained of lower abdominal pain otw here. A few days late on her period. Pinpoint pupils, took 1 oxy this morning. Possible head injury w/ seizure. Complained of head pain 24 in R. Arm BCG: 119

## 2019-10-31 LAB — URINE CULTURE: Culture: NO GROWTH

## 2019-11-03 LAB — CULTURE, BLOOD (ROUTINE X 2)
Culture: NO GROWTH
Special Requests: ADEQUATE

## 2019-12-17 ENCOUNTER — Other Ambulatory Visit: Payer: Self-pay

## 2019-12-17 ENCOUNTER — Emergency Department (HOSPITAL_COMMUNITY)
Admission: EM | Admit: 2019-12-17 | Discharge: 2019-12-17 | Disposition: A | Discharge status: Home / Self Care | Payer: Medicaid Other | Attending: Emergency Medicine | Admitting: Emergency Medicine

## 2019-12-17 ENCOUNTER — Emergency Department (HOSPITAL_COMMUNITY): Payer: Medicaid Other

## 2019-12-17 DIAGNOSIS — J45909 Unspecified asthma, uncomplicated: Secondary | ICD-10-CM | POA: Diagnosis not present

## 2019-12-17 DIAGNOSIS — Z79899 Other long term (current) drug therapy: Secondary | ICD-10-CM | POA: Diagnosis not present

## 2019-12-17 DIAGNOSIS — I10 Essential (primary) hypertension: Secondary | ICD-10-CM | POA: Diagnosis not present

## 2019-12-17 DIAGNOSIS — Z87891 Personal history of nicotine dependence: Secondary | ICD-10-CM | POA: Diagnosis not present

## 2019-12-17 DIAGNOSIS — M62830 Muscle spasm of back: Secondary | ICD-10-CM

## 2019-12-17 DIAGNOSIS — N3001 Acute cystitis with hematuria: Secondary | ICD-10-CM | POA: Insufficient documentation

## 2019-12-17 DIAGNOSIS — M545 Low back pain, unspecified: Secondary | ICD-10-CM | POA: Diagnosis present

## 2019-12-17 LAB — URINALYSIS, ROUTINE W REFLEX MICROSCOPIC
Bilirubin Urine: NEGATIVE
Glucose, UA: NEGATIVE mg/dL
Hgb urine dipstick: NEGATIVE
Ketones, ur: NEGATIVE mg/dL
Nitrite: NEGATIVE
Protein, ur: NEGATIVE mg/dL
Specific Gravity, Urine: 1.023 (ref 1.005–1.030)
pH: 7 (ref 5.0–8.0)

## 2019-12-17 LAB — CBC WITH DIFFERENTIAL/PLATELET
Abs Immature Granulocytes: 0.02 10*3/uL (ref 0.00–0.07)
Basophils Absolute: 0 10*3/uL (ref 0.0–0.1)
Basophils Relative: 1 %
Eosinophils Absolute: 0.1 10*3/uL (ref 0.0–0.5)
Eosinophils Relative: 2 %
HCT: 35.5 % — ABNORMAL LOW (ref 36.0–46.0)
Hemoglobin: 11.2 g/dL — ABNORMAL LOW (ref 12.0–15.0)
Immature Granulocytes: 0 %
Lymphocytes Relative: 27 %
Lymphs Abs: 2 10*3/uL (ref 0.7–4.0)
MCH: 26.3 pg (ref 26.0–34.0)
MCHC: 31.5 g/dL (ref 30.0–36.0)
MCV: 83.3 fL (ref 80.0–100.0)
Monocytes Absolute: 0.5 10*3/uL (ref 0.1–1.0)
Monocytes Relative: 7 %
Neutro Abs: 4.9 10*3/uL (ref 1.7–7.7)
Neutrophils Relative %: 63 %
Platelets: 277 10*3/uL (ref 150–400)
RBC: 4.26 MIL/uL (ref 3.87–5.11)
RDW: 15 % (ref 11.5–15.5)
WBC: 7.6 10*3/uL (ref 4.0–10.5)
nRBC: 0 % (ref 0.0–0.2)

## 2019-12-17 LAB — BASIC METABOLIC PANEL
Anion gap: 8 (ref 5–15)
BUN: 9 mg/dL (ref 6–20)
CO2: 25 mmol/L (ref 22–32)
Calcium: 8.9 mg/dL (ref 8.9–10.3)
Chloride: 105 mmol/L (ref 98–111)
Creatinine, Ser: 0.78 mg/dL (ref 0.44–1.00)
GFR, Estimated: >60 mL/min (ref >60)
Glucose, Bld: 100 mg/dL — ABNORMAL HIGH (ref 70–99)
Potassium: 3.9 mmol/L (ref 3.5–5.1)
Sodium: 138 mmol/L (ref 135–145)

## 2019-12-17 LAB — PREGNANCY, URINE: Preg Test, Ur: NEGATIVE

## 2019-12-17 MED ORDER — PREDNISONE 20 MG PO TABS: 20.0000 mg | ORAL_TABLET | Freq: Every day | ORAL | 0 refills | Status: AC

## 2019-12-17 MED ORDER — MORPHINE SULFATE (PF) 4 MG/ML IV SOLN
4.0000 mg | Freq: Once | INTRAVENOUS | Status: AC
  Administered 2019-12-17: 4 mg via INTRAVENOUS
  Filled 2019-12-17: qty 1

## 2019-12-17 MED ORDER — ONDANSETRON HCL 4 MG/2ML IJ SOLN
4.0000 mg | Freq: Once | INTRAMUSCULAR | Status: AC
  Administered 2019-12-17: 4 mg via INTRAVENOUS
  Filled 2019-12-17: qty 2

## 2019-12-17 MED ORDER — CEPHALEXIN 500 MG PO CAPS: 500.0000 mg | ORAL_CAPSULE | Freq: Two times a day (BID) | ORAL | 0 refills | Status: AC

## 2019-12-17 NOTE — Discharge Instructions (Signed)
Seen here for back pain and a UTI.  Start you on antibiotics for your UTI please take as prescribed.  Recommend staying hydrated as this will help with your with urinary symptoms.  I have given you a prescription for steroids please use as prescribed.  Recommend over-the-counter pain medications like ibuprofen and Tylenol.  You may apply heat to the area and stretch your back as this can help with pain and inflammation.  Do not take Tylenol with Percocets as it contains Tylenol in it.  Recommend follow-up with your PCP for further evaluation.  Come back to the emergency department if you develop chest pain, shortness of breath, severe abdominal pain, uncontrolled nausea, vomiting, diarrhea.

## 2019-12-17 NOTE — ED Provider Notes (Signed)
[facility] DEPT Provider Note   CSN: [id number] Arrival date & time: 12/17/19  1118     History Chief Complaint  Patient presents with   Back Pain    Jacqueline Clarke is a 34 y.o. female.  HPI   Patient with significant medical history of chronic back pain, scoliosis, kidney stones presents to emergency department with chief complaint of lower back pain.  Patient states she has chronic pain but over the last 3 days it has progressively gotten worse.  She denies recent falls, traumas to the area, denies IV drug use.  She describes the pain as a sharp throbbing sensation she feels in her lower back and radiates around her left and right flanks.  She denies paresthesias or weakness in the lower extremities, she endorses frequent urination but denies hematuria, dysuria, urinary retention or incontinency. Patient denies abdominal pain, nausea, vomiting, diarrhea or constipation.  She does endorse that she has felt slightly nauseated but has not actually vomited.  She has been taking her Percocets and muscle relaxers without any relief.  She states this feels somewhat similar to previous back pain but generally she does not become nauseous and have radiating flank pain.  She does have a history of kidney stones, cholecystectomy,  no other abdominal history.  She states walking tends to make the pain worse and nothing seems to make it better.  Patient denies headaches, fevers, chills, shortness of breath, chest pain, abdominal pain, vomiting, dysuria, pedal edema.  Past Medical History:  Diagnosis Date   Anemia    Anxiety    Asthma    Bipolar disorder ([place])    Blood type, Rh negative    Cholelithiasis    Chronic back pain    scoliosis- on percocet   Depression    Headache(784.0)    History of cocaine use    History of ileus 07/06/2016   After cesarean section   Hyperemesis complicating pregnancy, antepartum 2013   Infection    hx MRSA;  3 negative tests since   Low vitamin D level 01/03/2016   Obesity    Pregnancy induced hypertension    Pyelonephritis 07/03/2016   postpartum   Scoliosis    Scoliosis    Supervision of high risk pregnancy, antepartum 10/07/2017    Nursing Staff Provider Office Location  [facility] Dating   Language  English Anatomy US   Flu Vaccine  Declined 02/02/2018 Genetic Screen  NIPS:   AFP:   First Screen:  Quad:   TDaP vaccine   declined  02/02/2018 Hgb A1C or  GTT Early  Third trimester  Rhogam   02/02/2018   LAB RESULTS  Feeding Plan Breast/Bottle Blood Type O/Negative/-- (09/18 1525) ONeg Contraception BTL? Antibody Negative (09/18    Patient Active Problem List   Diagnosis Date Noted   Gonorrhea 08/15/2019   Neck pain 08/13/2019   Bacteremia due to Gram-negative bacteria 08/13/2019   Ground glass opacity present on imaging of lung 08/13/2019   Hypertension 08/11/2019   Cough 08/11/2019   Right lateral abdominal pain 08/11/2019   Obesity (BMI 30-39.9) 08/11/2019   Normocytic anemia 08/10/2019   Chronic back pain 08/10/2019   Thrombophlebitis 04/07/2018   H/O: C-section 03/26/2018   Traumatic injury during pregnancy 09/28/2017   Adjustment insomnia 07/09/2016   History of gestational hypertension 06/30/2016   Morbid obesity [facility])     Past Surgical History:  Procedure Laterality Date   arm surgery     CESAREAN SECTION N/A 06/23/2016  Procedure: CESAREAN SECTION;  Surgeon: Aletha Halim, MD;  Location: [place];  Service: Obstetrics;  Laterality: N/A;   CHOLECYSTECTOMY     DILATION AND CURETTAGE OF UTERUS  2008   MR WRIST RIGHT     sx to nerves   NERVE, TENDON AND ARTERY REPAIR Right 06/18/2014   Procedure: NERVE, TENDON AND ARTERY REPAIR;  Surgeon: Iran Planas, MD;  Location: [place];  Service: Orthopedics;  Laterality: Right;   TONSILLECTOMY AND ADENOIDECTOMY     TUBAL LIGATION N/A 04/01/2018   Procedure: POST PARTUM TUBAL LIGATION;  Surgeon:  Osborne Oman, MD;  Location: [facility];  Service: Gynecology;  Laterality: N/A;   VAGINAL DELIVERY  06/23/2016   Procedure: VAGINAL DELIVERY;  Surgeon: Aletha Halim, MD;  Location: [place];  Service: Obstetrics;;     OB History    Gravida  8   Para  6   Term  5   Preterm  1   AB  2   Living  7     SAB  2   TAB  0   Ectopic  0   Multiple  1   Live Births  7           Family History  Problem Relation Age of Onset   Heart disease Maternal Grandfather    Diabetes Maternal Grandfather    Heart disease Father    Anesthesia problems Neg Hx     Social History   Tobacco Use   Smoking status: Former Smoker    Packs/day: 0.25    Types: Cigarettes    Quit date: 08/02/2017    Years since quitting: 2.3   Smokeless tobacco: Never Used  Vaping Use   Vaping Use: Never used  Substance Use Topics   Alcohol use: Not Currently    Alcohol/week: 0.0 standard drinks    Comment: Occassionally   Drug use: Not Currently    Types: Cocaine    Comment: UDS + 11/22/17    Home Medications Prior to Admission medications   Medication Sig Start Date End Date Taking? Authorizing Provider  cephALEXin (KEFLEX) 500 MG capsule Take 1 capsule (500 mg total) by mouth 2 (two) times daily for 7 days. 12/17/19 12/24/19  Marcello Fennel, PA-C  cyclobenzaprine (FLEXERIL) 10 MG tablet Take 1 tablet (10 mg total) by mouth 2 (two) times daily as needed for muscle spasms. 09/12/18   Lucrezia Starch, MD  etodolac (LODINE XL) 600 MG 24 hr tablet Take 600 mg by mouth at bedtime. 10/05/19   [provider]  gabapentin (NEURONTIN) 800 MG tablet Take 800 mg by mouth 3 (three) times daily. 08/04/19   [provider]  hydrochlorothiazide (HYDRODIURIL) 50 MG tablet Take 50 mg by mouth daily. 08/04/19   [provider]  ketorolac (TORADOL) 10 MG tablet Take 1 tablet (10 mg total) by mouth every 6 (six) hours as needed. Patient not taking: Reported on  10/29/2019 05/05/18   Darlina Rumpf, CNM  methocarbamol (ROBAXIN) 500 MG tablet Take 1 tablet (500 mg total) by mouth 2 (two) times daily. Patient taking differently: Take 500 mg by mouth every 6 (six) hours as needed for muscle spasms.  09/02/18   Domenic Moras, PA-C  NARCAN 4 MG/0.1ML LIQD nasal spray kit Place 1 spray into the nose once as needed (overdose).  10/17/19   [provider]  oxyCODONE-acetaminophen (PERCOCET) 10-325 MG tablet Take 1 tablet by mouth 4 (four) times daily as needed for pain.  08/05/19   [provider]  OZEMPIC, 0.25 OR 0.5 MG/DOSE, 2 MG/1.5ML SOPN Inject 0.25 mg into the skin once a week.  10/26/19   [provider]  predniSONE (DELTASONE) 20 MG tablet Take 1 tablet (20 mg total) by mouth daily for 5 days. 12/17/19 12/22/19  Marcello Fennel, PA-C    Allergies    Patient has no known allergies.  Review of Systems   Review of Systems  Constitutional: Negative for chills and fever.  HENT: Negative for congestion and sore throat.   Respiratory: Negative for cough and shortness of breath.   Cardiovascular: Negative for chest pain.  Gastrointestinal: Positive for nausea. Negative for abdominal pain, constipation, diarrhea, rectal pain and vomiting.  Genitourinary: Positive for frequency. Negative for dysuria, enuresis, flank pain, vaginal bleeding and vaginal discharge.  Musculoskeletal: Positive for back pain.  Skin: Negative for rash.  Neurological: Positive for dizziness. Negative for headaches.  Hematological: Does not bruise/bleed easily.    Physical Exam Updated Vital Signs BP (!) 137/102 (BP Location: Left Arm)    Pulse (!) 56    Temp 97.6 F (36.4 C) (Oral)    Resp 18    Ht '5\' 10"'  (1.778 m)    Wt 126.1 kg    LMP 11/09/2019    SpO2 100%    BMI 39.89 kg/m   Physical Exam Vitals and nursing note reviewed.  Constitutional:      General: She is not in acute distress.    Appearance: She is not ill-appearing.  HENT:     Head:  Normocephalic and atraumatic.     Nose: No congestion.  Eyes:     Conjunctiva/sclera: Conjunctivae normal.  Cardiovascular:     Rate and Rhythm: Normal rate and regular rhythm.     Pulses: Normal pulses.     Heart sounds: No murmur heard.  No friction rub. No gallop.   Pulmonary:     Effort: No respiratory distress.     Breath sounds: No wheezing, rhonchi or rales.  Abdominal:     Palpations: Abdomen is soft.     Tenderness: There is abdominal tenderness. There is no right CVA tenderness or left CVA tenderness.     Comments: Patient's abdomen was visualized it was nondistended, normoactive bowel sounds, slight tenderness to palpation along the right upper quadrant and right flank.  There is no rebound tenderness, negative McBurney point, no peritoneal sign  Musculoskeletal:     Comments: Spine was visualized no lacerations, abrasions, ecchymosis or other gross abnormalities noted.  She had tenderness to palpation along her lumbar spine and paraspinal muscles, she had full range of motion, 5 5 strength neurovascular intact in lower extremities.  Positive straight leg test on the right side.  Skin:    General: Skin is warm and dry.     Findings: No rash.     Comments: Skin exam was performed no track marks, lacerations, abrasions, ecchymosis,  No erythematous or edematous joints noted.  Neurological:     Mental Status: She is alert.  Psychiatric:        Mood and Affect: Mood normal.     ED Results / Procedures / Treatments   Labs (all labs ordered are listed, but only abnormal results are displayed) Labs Reviewed  BASIC METABOLIC PANEL - Abnormal; Notable for the following components:      Result Value   Glucose, Bld 100 (*)    All other components within normal limits  CBC WITH DIFFERENTIAL/PLATELET - Abnormal; Notable  for the following components:   Hemoglobin 11.2 (*)    HCT 35.5 (*)    All other components within normal limits  URINALYSIS, ROUTINE W REFLEX MICROSCOPIC -  Abnormal; Notable for the following components:   APPearance CLOUDY (*)    Leukocytes,Ua LARGE (*)    Bacteria, UA RARE (*)    All other components within normal limits  URINE CULTURE  PREGNANCY, URINE    EKG None  Radiology CT Renal Stone Study  Result Date: 12/17/2019 CLINICAL DATA:  Right flank pain.  History of nephrolithiasis EXAM: CT ABDOMEN AND PELVIS WITHOUT CONTRAST TECHNIQUE: Multidetector CT imaging of the abdomen and pelvis was performed following the standard protocol without IV contrast. COMPARISON:  10/29/2019 FINDINGS: Lower chest: Included lung bases are clear.  Heart size is normal. Hepatobiliary: Unremarkable unenhanced appearance of the liver. Prior cholecystectomy. No biliary dilatation. Pancreas: Unremarkable. No pancreatic ductal dilatation or surrounding inflammatory changes. 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 Spleen is otherwise unremarkable. Adrenals/Urinary Tract: Unremarkable adrenal glands. There are a few scattered punctate 1-2 mm nonobstructing bilateral renal calculi. Kidneys have an otherwise unremarkable unenhanced appearance. No hydronephrosis. Bilateral ureters are unremarkable. No ureteral calculi. Urinary bladder is unremarkable for the degree of distension. Stomach/Bowel: Stomach is within normal limits. Appendix appears normal (series 2, image 61). No evidence of bowel wall thickening, distention, or inflammatory changes. Vascular/Lymphatic: No significant vascular findings are present. No enlarged abdominal or pelvic lymph nodes. Reproductive: Anteverted uterus with bilateral tubal ligation clips. No adnexal abnormality. Other: No free fluid. No abdominopelvic fluid collection. No pneumoperitoneum. No abdominal wall hernia. Musculoskeletal: No acute or significant osseous findings. IMPRESSION: 1. No acute abdominopelvic findings. 2.  Tiny punctate nonobstructing bilateral renal calculi. No hydronephrosis. 3. 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 Electronically Signed   By: Davina Poke D.O.   On: 12/17/2019 13:46    Procedures Procedures (including critical care time)  Medications Ordered in ED Medications  ondansetron (ZOFRAN) injection 4 mg (4 mg Intravenous Given 12/17/19 1237)  morphine 4 MG/ML injection 4 mg (4 mg Intravenous Given 12/17/19 1237)    ED Course  I have reviewed the triage vital signs and the nursing notes.  Pertinent labs & imaging results that were available during my care of the patient were reviewed by me and considered in my medical decision making (see chart for details).    MDM Rules/Calculators/A&P                          I have personally reviewed all imaging, labs and have interpreted them.  Patient presents with lower back pain.  She was alert, does not appear in acute distress, vital signs reassuring.  Will obtain basic lab work, urine analysis, start patient on morphine and antiemetics and reevaluate.  Patient is reevaluated after providing her with morphine and Zofran, she states she is feeling a little bit better, denies feeling nauseous anymore.  UA concerning for UTI versus pyelonephritis versus kidney stones will obtain CT renal for further eval   CBC negative for leukocytosis, shows shows normocytic anemia appears to be a baseline for patient.  BMP negative for Electra abnormalities, no metabolic acidosis, hyperglycemia of 100, no AKI, no anion gap noted.  Urine pregnancy is  negative.  UA shows large leukocytes, moderate red blood cells and white blood cells, rare bacteria.  We will culture urine for further evaluation.  CT renal shows no acute abdominal pelvic findings, tiny punctuated nonobstructing bilateral renal Calculi, known cystic lesion within the  posterior aspect of the spleen which has decreased in size 3.4 cm to 2.2 cm  Low suspicion for systemic infection as patient is nontoxic-appearing, vital signs reassuring. Low suspicion for intra-abdominal abnormality requiring immediate intervention as there is no acute abdomen on exam, patient tolerating p.o., CT imaging does not reveal any acute findings.  Low suspicion for  Pyelonephritis, no CVA tenderness, kidneys within normal limits on CT renal.  I suspect patient suffering from acute cystitis as she is complaining of dysuria and has an abnormal UA.  Will obtain urine cultures and start her on antibiotics.  Low suspicion for spinal fracture,  spinal cord abnormality  as there is no red flag symptoms, denies IV drug use, patient has full range of motion, 5 /5 strength, neurovascularly intact in lower extremities. Suspect patient has an acute on chronic lower back pain will start her on prednisone and follow-up with her PCP.    Vital signs have remained stable, no indication for hospital admission. Patient given at home care as well strict return precautions.  Patient verbalized that they understood agreed to said plan.  Final Clinical Impression(s) / ED Diagnoses Final diagnoses:  Acute cystitis with hematuria  Muscle spasm of back    Rx / DC Orders ED Discharge Orders         Ordered    predniSONE (DELTASONE) 20 MG tablet  Daily        12/17/19 1409    cephALEXin (KEFLEX) 500 MG capsule  2 times daily        12/17/19 1409           Marcello Fennel, PA-C 12/17/19 1500    Milton Ferguson, MD 12/18/19 [phone]

## 2019-12-17 NOTE — ED Triage Notes (Signed)
Patient reports lower back pain 10/10 x 3 days. Both sides of back. Patient says she felt light headed last night, took her pain meds but did not help with pain. Pain radiates toward abdomen. Patient says she has history of scoliosis and liver problems. Pain described as throbbing and stabbing. Continuous. Patient feels nauseated from pain.

## 2019-12-19 LAB — URINE CULTURE

## 2020-01-25 ENCOUNTER — Ambulatory Visit (INDEPENDENT_AMBULATORY_CARE_PROVIDER_SITE_OTHER): Payer: Medicaid Other

## 2020-01-25 ENCOUNTER — Ambulatory Visit (HOSPITAL_COMMUNITY)
Admission: EM | Admit: 2020-01-25 | Discharge: 2020-01-25 | Disposition: A | Discharge status: Home / Self Care | Payer: Medicaid Other | Attending: Internal Medicine | Admitting: Internal Medicine

## 2020-01-25 ENCOUNTER — Encounter (HOSPITAL_COMMUNITY): Payer: Self-pay | Admitting: Emergency Medicine

## 2020-01-25 DIAGNOSIS — M79644 Pain in right finger(s): Secondary | ICD-10-CM

## 2020-01-25 DIAGNOSIS — S56415A Strain of extensor muscle, fascia and tendon of right ring finger at forearm level, initial encounter: Secondary | ICD-10-CM | POA: Diagnosis not present

## 2020-01-25 MED ORDER — NAPROXEN 500 MG PO TABS: 500.0000 mg | ORAL_TABLET | Freq: Two times a day (BID) | ORAL | 0 refills | Status: DC

## 2020-01-25 NOTE — ED Provider Notes (Signed)
[place]    CSN: [id number] Arrival date & time: 01/25/20  1443      History   Chief Complaint Chief Complaint  Patient presents with  . Finger Injury    HPI Jacqueline Clarke is a 35 y.o. female slipped off the bed in a hotel this am and injured her R ring finger. Has swelling and mild bruising. Hurts to palpate mid joint and flex it. Denies paresthesia or past injury of this finger.     Past Medical History:  Diagnosis Date  . Anemia   . Anxiety   . Asthma   . Bipolar disorder ([place])   . Blood type, Rh negative   . Cholelithiasis   . Chronic back pain    scoliosis- on percocet  . Depression   . Headache(784.0)   . History of cocaine use   . History of ileus 07/06/2016   After cesarean section  . Hyperemesis complicating pregnancy, antepartum 2013  . 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 Low vitamin D level 01/03/2016  . Obesity   . Pregnancy induced hypertension   . Pyelonephritis 07/03/2016   postpartum  . Scoliosis   . Scoliosis   . Supervision of high risk pregnancy, antepartum 10/07/2017    Nursing Staff Provider Office Location  [facility] Dating   Language  English Anatomy US   Flu Vaccine  Declined 02/02/2018 Genetic Screen  NIPS:   AFP:   First Screen:  Quad:   TDaP vaccine   declined  02/02/2018 Hgb A1C or  GTT Early  Third trimester  Rhogam   02/02/2018   LAB RESULTS  Feeding Plan Breast/Bottle Blood Type O/Negative/-- (09/18 1525) ONeg Contraception BTL? Antibody Negative (09/18    Patient Active Problem List   Diagnosis Date Noted  . Gonorrhea 08/15/2019  . Neck pain 08/13/2019  . Bacteremia due to Gram-negative bacteria 08/13/2019  . Ground glass opacity present on imaging of lung 08/13/2019  . Hypertension 08/11/2019  . Cough 08/11/2019  . Right lateral abdominal pain 08/11/2019  . Obesity (BMI 30-39.9) 08/11/2019  . Normocytic anemia 08/10/2019  . Chronic back pain 08/10/2019  . Thrombophlebitis 04/07/2018  . H/O:  C-section 03/26/2018  . Traumatic injury during pregnancy 09/28/2017  . Adjustment insomnia 07/09/2016  . History of gestational hypertension 06/30/2016  . Morbid obesity ([place])     Past Surgical History:  Procedure Laterality Date  . arm surgery    . CESAREAN SECTION N/A 06/23/2016   Procedure: CESAREAN SECTION;  Surgeon: Aletha Halim, MD;  Location: [place];  Service: Obstetrics;  Laterality: N/A;  . CHOLECYSTECTOMY    . DILATION AND CURETTAGE OF UTERUS  2008  . MR WRIST RIGHT     sx to nerves  . NERVE, TENDON AND ARTERY REPAIR Right 06/18/2014   Procedure: NERVE, TENDON AND ARTERY REPAIR;  Surgeon: Iran Planas, MD;  Location: [place];  Service: Orthopedics;  Laterality: Right;  . TONSILLECTOMY AND ADENOIDECTOMY    . TUBAL LIGATION N/A 04/01/2018   Procedure: POST PARTUM TUBAL LIGATION;  Surgeon: Osborne Oman, MD;  Location: [facility];  Service: Gynecology;  Laterality: N/A;  . VAGINAL DELIVERY  06/23/2016   Procedure: VAGINAL DELIVERY;  Surgeon: Aletha Halim, MD;  Location: [place];  Service: Obstetrics;;    OB History    Gravida  8   Para  6   Term  5   Preterm  1   AB  2  Living  7     SAB  2   IAB  0   Ectopic  0   Multiple  1   Live Births  7            Home Medications    Prior to Admission medications   Medication Sig Start Date End Date Taking? Authorizing Provider  naproxen (NAPROSYN) 500 MG tablet Take 1 tablet (500 mg total) by mouth 2 (two) times daily. Prn pain and inflammation 01/25/20  Yes Rodriguez-Southworth, Sunday Spillers, PA-C  cyclobenzaprine (FLEXERIL) 10 MG tablet Take 1 tablet (10 mg total) by mouth 2 (two) times daily as needed for muscle spasms. 09/12/18   Lucrezia Starch, MD  gabapentin (NEURONTIN) 800 MG tablet Take 800 mg by mouth 3 (three) times daily. 08/04/19   [provider]  hydrochlorothiazide (HYDRODIURIL) 50 MG tablet Take 50 mg by mouth daily. 08/04/19   [provider]   ketorolac (TORADOL) 10 MG tablet Take 1 tablet (10 mg total) by mouth every 6 (six) hours as needed. Patient not taking: Reported on 10/29/2019 05/05/18   Darlina Rumpf, CNM  methocarbamol (ROBAXIN) 500 MG tablet Take 1 tablet (500 mg total) by mouth 2 (two) times daily. Patient taking differently: Take 500 mg by mouth every 6 (six) hours as needed for muscle spasms.  09/02/18   Domenic Moras, PA-C  NARCAN 4 MG/0.1ML LIQD nasal spray kit Place 1 spray into the nose once as needed (overdose).  10/17/19   [provider]  oxyCODONE-acetaminophen (PERCOCET) 10-325 MG tablet Take 1 tablet by mouth 4 (four) times daily as needed for pain.  08/05/19   [provider]  OZEMPIC, 0.25 OR 0.5 MG/DOSE, 2 MG/1.5ML SOPN Inject 0.25 mg into the skin once a week.  10/26/19   [provider]    Family History Family History  Problem Relation Age of Onset  . Heart disease Maternal Grandfather   . Diabetes Maternal Grandfather   . Heart disease Father   . Anesthesia problems Neg Hx     Social History Social History   Tobacco Use  . Smoking status: Former Smoker    Packs/day: 0.25    Types: Cigarettes    Quit date: 08/02/2017    Years since quitting: 2.4  . Smokeless tobacco: Never Used  Vaping Use  . Vaping Use: Never used  Substance Use Topics  . Alcohol use: Not Currently    Alcohol/week: 0.0 standard drinks    Comment: Occassionally  . Drug use: Not Currently    Types: Cocaine    Comment: UDS + 11/22/17     Allergies   Patient has no known allergies.   Review of Systems Review of Systems  Musculoskeletal: Positive for arthralgias and joint swelling.  Skin: Positive for color change. Negative for rash and wound.       + ecchymosis  Neurological: Negative for numbness.     Physical Exam Triage Vital Signs ED Triage Vitals  Enc Vitals Group     BP 01/25/20 1647 132/90     Pulse Rate 01/25/20 1647 72     Resp --      Temp 01/25/20 1647 (!) 97.4 F  (36.3 C)     Temp Source 01/25/20 1647 Temporal     SpO2 01/25/20 1647 100 %     Weight --      Height --      Head Circumference --      Peak Flow --  Pain Score 01/25/20 1645 10     Pain Loc --      Pain Edu? --      Excl. in [place]? --    No data found.  Updated Vital Signs BP 132/90 (BP Location: Left Arm)   Pulse 72   Temp (!) 97.4 F (36.3 C) (Temporal)   LMP 12/24/2019   SpO2 100%   Breastfeeding No   Visual Acuity Right Eye Distance:   Left Eye Distance:   Bilateral Distance:    Right Eye Near:   Left Eye Near:    Bilateral Near:     Physical Exam Vitals and nursing note reviewed.  Constitutional:      General: She is not in acute distress.    Appearance: She is obese. She is not toxic-appearing.  HENT:     Right Ear: External ear normal.     Left Ear: External ear normal.  Eyes:     General: No scleral icterus.    Conjunctiva/sclera: Conjunctivae normal.  Pulmonary:     Effort: Pulmonary effort is normal.  Musculoskeletal:        General: Swelling, tenderness and signs of injury present. No deformity.     Cervical back: Neck supple.     Comments: R HAND- has mild swelling and faint ecchymosis of mid ring finger joint area. Unable to fully extend it due to pain and swelling. Flexion is limited due to pain and swelling. R hand does not show swelling or ecchymosis, but does have mild tenderness on dorsal mid hand.  R WRIST- no swelling, ecchymosis or tenderness. Has normal ROM   DOES NOT HAVE PAIN ON ELBOW OR SHOULDER WITH PALPATION  Skin:    Capillary Refill: Capillary refill takes less than 2 seconds.     Findings: Bruising present.  Neurological:     Mental Status: She is alert.     Sensory: No sensory deficit.     Gait: Gait normal.  Psychiatric:        Mood and Affect: Mood normal.        Behavior: Behavior normal.        Thought Content: Thought content normal.        Judgment: Judgment normal.      UC Treatments / Results  Labs (all  labs ordered are listed, but only abnormal results are displayed) Labs Reviewed - No data to display  EKG   Radiology DG Finger Ring Right  Result Date: 01/25/2020 CLINICAL DATA:  Pain status post fall EXAM: RIGHT RING FINGER 2+V COMPARISON:  None. FINDINGS: There is no evidence of fracture or dislocation. There is no evidence of arthropathy or other focal bone abnormality. Soft tissues are unremarkable. IMPRESSION: Negative. Electronically Signed   By: Constance Holster M.D.   On: 01/25/2020 17:35    Procedures Procedures (including critical care time)  Medications Ordered in UC Medications - No data to display  Initial Impression / Assessment and Plan / UC Course  I have reviewed the triage vital signs and the nursing notes. Strain and contusion R ring finger. She was placed on a splint and buddy taped the fingers and placed on Naprosyn as noted for pain. See instructions.  Pertinent  imaging results that were available during my care of the patient were reviewed by me and considered in my medical decision making (see chart for details).   Final Clinical Impressions(s) / UC Diagnoses   Final diagnoses:  Strain of extensor muscle, fascia and tendon of right  ring finger at forearm level, initial encounter     Discharge Instructions     You have strained your finger, but due to the swelling an pain the ligaments cant be examined well enough, so I need you to follow up with orthopedics next week at [facility]. Keep the taping on at all times except when you shower.     ED Prescriptions    Medication Sig Dispense Auth. Provider   naproxen (NAPROSYN) 500 MG tablet Take 1 tablet (500 mg total) by mouth 2 (two) times daily. Prn pain and inflammation 30 tablet Rodriguez-Southworth, Sunday Spillers, PA-C     PDMP not reviewed this encounter.   Shelby Mattocks, [place] 01/25/20 1824

## 2020-01-25 NOTE — ED Triage Notes (Signed)
Pt states that she had a fall yesterday at home and landed on her right side and injured her index finger. Pt states that she can not bend her finger, no bruising, slight swelling.

## 2020-01-25 NOTE — Discharge Instructions (Signed)
You have strained your finger, but due to the swelling an pain the ligaments cant be examined well enough, so I need you to follow up with orthopedics next week at [facility]. Keep the taping on at all times except when you shower.

## 2020-06-16 ENCOUNTER — Encounter (HOSPITAL_COMMUNITY): Payer: Self-pay | Admitting: Emergency Medicine

## 2020-06-16 ENCOUNTER — Observation Stay (HOSPITAL_COMMUNITY)
Admission: EM | Admit: 2020-06-16 | Discharge: 2020-06-16 | Disposition: A | Discharge status: Home / Self Care | Payer: Medicaid Other | Attending: Internal Medicine | Admitting: Internal Medicine

## 2020-06-16 ENCOUNTER — Emergency Department (HOSPITAL_COMMUNITY): Payer: Medicaid Other

## 2020-06-16 ENCOUNTER — Other Ambulatory Visit: Payer: Self-pay

## 2020-06-16 DIAGNOSIS — J45909 Unspecified asthma, uncomplicated: Secondary | ICD-10-CM | POA: Insufficient documentation

## 2020-06-16 DIAGNOSIS — Y900 Blood alcohol level of less than 20 mg/100 ml: Secondary | ICD-10-CM | POA: Diagnosis not present

## 2020-06-16 DIAGNOSIS — Z20822 Contact with and (suspected) exposure to covid-19: Secondary | ICD-10-CM | POA: Diagnosis not present

## 2020-06-16 DIAGNOSIS — R569 Unspecified convulsions: Principal | ICD-10-CM

## 2020-06-16 DIAGNOSIS — Z79899 Other long term (current) drug therapy: Secondary | ICD-10-CM | POA: Diagnosis not present

## 2020-06-16 DIAGNOSIS — G9340 Encephalopathy, unspecified: Secondary | ICD-10-CM

## 2020-06-16 DIAGNOSIS — G928 Other toxic encephalopathy: Secondary | ICD-10-CM | POA: Insufficient documentation

## 2020-06-16 DIAGNOSIS — Z87891 Personal history of nicotine dependence: Secondary | ICD-10-CM | POA: Insufficient documentation

## 2020-06-16 DIAGNOSIS — G9341 Metabolic encephalopathy: Secondary | ICD-10-CM

## 2020-06-16 DIAGNOSIS — I10 Essential (primary) hypertension: Secondary | ICD-10-CM | POA: Diagnosis not present

## 2020-06-16 LAB — URINALYSIS, ROUTINE W REFLEX MICROSCOPIC
Bilirubin Urine: NEGATIVE
Glucose, UA: NEGATIVE mg/dL
Hgb urine dipstick: NEGATIVE
Ketones, ur: NEGATIVE mg/dL
Nitrite: NEGATIVE
Protein, ur: NEGATIVE mg/dL
Specific Gravity, Urine: 1.009 (ref 1.005–1.030)
pH: 6 (ref 5.0–8.0)

## 2020-06-16 LAB — CBC WITH DIFFERENTIAL/PLATELET
Abs Immature Granulocytes: 0.01 10*3/uL (ref 0.00–0.07)
Basophils Absolute: 0.1 10*3/uL (ref 0.0–0.1)
Basophils Relative: 1 %
Eosinophils Absolute: 0.1 10*3/uL (ref 0.0–0.5)
Eosinophils Relative: 2 %
HCT: 37.1 % (ref 36.0–46.0)
Hemoglobin: 11.4 g/dL — ABNORMAL LOW (ref 12.0–15.0)
Immature Granulocytes: 0 %
Lymphocytes Relative: 35 %
Lymphs Abs: 2.7 10*3/uL (ref 0.7–4.0)
MCH: 26.8 pg (ref 26.0–34.0)
MCHC: 30.7 g/dL (ref 30.0–36.0)
MCV: 87.3 fL (ref 80.0–100.0)
Monocytes Absolute: 0.5 10*3/uL (ref 0.1–1.0)
Monocytes Relative: 7 %
Neutro Abs: 4.4 10*3/uL (ref 1.7–7.7)
Neutrophils Relative %: 55 %
Platelets: 170 10*3/uL (ref 150–400)
RBC: 4.25 MIL/uL (ref 3.87–5.11)
RDW: 15.8 % — ABNORMAL HIGH (ref 11.5–15.5)
WBC: 7.7 10*3/uL (ref 4.0–10.5)
nRBC: 0 % (ref 0.0–0.2)

## 2020-06-16 LAB — COMPREHENSIVE METABOLIC PANEL
ALT: 21 U/L (ref 0–44)
AST: 22 U/L (ref 15–41)
Albumin: 3.6 g/dL (ref 3.5–5.0)
Alkaline Phosphatase: 61 U/L (ref 38–126)
Anion gap: 9 (ref 5–15)
BUN: 15 mg/dL (ref 6–20)
CO2: 22 mmol/L (ref 22–32)
Calcium: 8.5 mg/dL — ABNORMAL LOW (ref 8.9–10.3)
Chloride: 109 mmol/L (ref 98–111)
Creatinine, Ser: 0.69 mg/dL (ref 0.44–1.00)
GFR, Estimated: >60 mL/min (ref >60)
Glucose, Bld: 77 mg/dL (ref 70–99)
Potassium: 3.8 mmol/L (ref 3.5–5.1)
Sodium: 140 mmol/L (ref 135–145)
Total Bilirubin: 0.6 mg/dL (ref 0.3–1.2)
Total Protein: 7.1 g/dL (ref 6.5–8.1)

## 2020-06-16 LAB — SARS CORONAVIRUS 2 (TAT 6-24 HRS): SARS Coronavirus 2: NEGATIVE

## 2020-06-16 LAB — RAPID URINE DRUG SCREEN, HOSP PERFORMED
Amphetamines: NOT DETECTED
Barbiturates: NOT DETECTED
Benzodiazepines: POSITIVE — AB
Cocaine: NOT DETECTED
Opiates: NOT DETECTED
Tetrahydrocannabinol: POSITIVE — AB

## 2020-06-16 LAB — ETHANOL: Alcohol, Ethyl (B): 94 mg/dL — ABNORMAL HIGH (ref <10)

## 2020-06-16 LAB — CBG MONITORING, ED: Glucose-Capillary: 81 mg/dL (ref 70–99)

## 2020-06-16 LAB — PREGNANCY, URINE: Preg Test, Ur: NEGATIVE

## 2020-06-16 MED ORDER — LORAZEPAM 2 MG/ML IJ SOLN
INTRAMUSCULAR | Status: AC
  Administered 2020-06-16: 2 mg
  Filled 2020-06-16: qty 1

## 2020-06-16 MED ORDER — LEVETIRACETAM IN NACL 1500 MG/100ML IV SOLN
1500.0000 mg | Freq: Once | INTRAVENOUS | Status: AC
  Administered 2020-06-16: 1500 mg via INTRAVENOUS
  Filled 2020-06-16: qty 100
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
## 2020-06-16 NOTE — Discharge Summary (Signed)
AMA DISCHARGE  Notified by nursing that patient wanted to leave AMA. No reason given. She was given [facility] paperwork and left.  Final diagnosis: Acute toxic encephalopathy EtOH use Marijuana abuse HTN  Follow up with PCP as soon as possible. Avoid further EtOH and illicit Rx use.   Per H&P: HPI: Jacqueline Clarke is a 35 y.o. female with medical history significant of HTN. Presenting with altered mental status. She is unable to tell me why she is in the hospital. She is in and out of sleep during interview. History is from chart review. Apparently the patient was leaving a hotel room when she felt to the ground with seizure-like activity. EMS was alerted and she was transported to the ED. It was noted that she had an additional seizure en route. She was given versed in the field. She was post-ictal on arrival.   ED Course: At initial eval of the patient by ED, there was seizure -like activity. She was given IV ativan and keppra. Neurology was consulted. [facility] was called for admission.   Teddy Spike, DO

## 2020-06-16 NOTE — ED Notes (Signed)
Patient wants to leave AMA. Dr. Ronaldo Miyamoto aware.

## 2020-06-16 NOTE — ED Provider Notes (Signed)
[place] DEPT Provider Note   CSN: [id number] Arrival date & time: 06/16/20  0136     History Chief Complaint  Patient presents with  . Seizures    Jacqueline Clarke is a 35 y.o. female.  Patient to ED by EMS called for seizure activity. Per their report, she was leaving a hotel room and fell to the ground with active seizure activity. She was post-ictal on their arrival but had another seizure in route. She was given Versed IM by paramedics. Actively seizing on initial evaluation in the ED.   The history is provided by the patient and the EMS personnel. No language interpreter was used.  Seizures      Past Medical History:  Diagnosis Date  . Anemia   . Anxiety   . Asthma   . Bipolar disorder ([place])   . Blood type, Rh negative   . Cholelithiasis   . Chronic back pain    scoliosis- on percocet  . Depression   . Headache(784.0)   . History of cocaine use   . History of ileus 07/06/2016   After cesarean section  . Hyperemesis complicating pregnancy, antepartum 2013  . 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 Low vitamin D level 01/03/2016  . Obesity   . Pregnancy induced hypertension   . Pyelonephritis 07/03/2016   postpartum  . Scoliosis   . Scoliosis   . Supervision of high risk pregnancy, antepartum 10/07/2017    Nursing Staff Provider Office Location  [facility] Dating   Language  English Anatomy US   Flu Vaccine  Declined 02/02/2018 Genetic Screen  NIPS:   AFP:   First Screen:  Quad:   TDaP vaccine   declined  02/02/2018 Hgb A1C or  GTT Early  Third trimester  Rhogam   02/02/2018   LAB RESULTS  Feeding Plan Breast/Bottle Blood Type O/Negative/-- (09/18 1525) ONeg Contraception BTL? Antibody Negative (09/18    Patient Active Problem List   Diagnosis Date Noted  . Gonorrhea 08/15/2019  . Neck pain 08/13/2019  . Bacteremia due to Gram-negative bacteria 08/13/2019  . Ground glass opacity present on imaging of lung  08/13/2019  . Hypertension 08/11/2019  . Cough 08/11/2019  . Right lateral abdominal pain 08/11/2019  . Obesity (BMI 30-39.9) 08/11/2019  . Normocytic anemia 08/10/2019  . Chronic back pain 08/10/2019  . Thrombophlebitis 04/07/2018  . H/O: C-section 03/26/2018  . Traumatic injury during pregnancy 09/28/2017  . Adjustment insomnia 07/09/2016  . History of gestational hypertension 06/30/2016  . Morbid obesity ([place])     Past Surgical History:  Procedure Laterality Date  . arm surgery    . CESAREAN SECTION N/A 06/23/2016   Procedure: CESAREAN SECTION;  Surgeon: Aletha Halim, MD;  Location: [facility];  Service: Obstetrics;  Laterality: N/A;  . CHOLECYSTECTOMY    . DILATION AND CURETTAGE OF UTERUS  2008  . MR WRIST RIGHT     sx to nerves  . NERVE, TENDON AND ARTERY REPAIR Right 06/18/2014   Procedure: NERVE, TENDON AND ARTERY REPAIR;  Surgeon: Iran Planas, MD;  Location: [facility];  Service: Orthopedics;  Laterality: Right;  . TONSILLECTOMY AND ADENOIDECTOMY    . TUBAL LIGATION N/A 04/01/2018   Procedure: POST PARTUM TUBAL LIGATION;  Surgeon: Osborne Oman, MD;  Location: [facility];  Service: Gynecology;  Laterality: N/A;  . VAGINAL DELIVERY  06/23/2016   Procedure: VAGINAL DELIVERY;  Surgeon: Aletha Halim, MD;  Location: [facility] BIRTHING  SUITES;  Service: Obstetrics;;     OB History    Gravida  8   Para  6   Term  5   Preterm  1   AB  2   Living  7     SAB  2   IAB  0   Ectopic  0   Multiple  1   Live Births  7           Family History  Problem Relation Age of Onset  . Heart disease Maternal Grandfather   . Diabetes Maternal Grandfather   . Heart disease Father   . Anesthesia problems Neg Hx     Social History   Tobacco Use  . Smoking status: Former Smoker    Packs/day: 0.25    Types: Cigarettes    Quit date: 08/02/2017    Years since quitting: 2.8  . Smokeless tobacco: Never Used  Vaping Use  . Vaping Use: Never used  Substance Use  Topics  . Alcohol use: Not Currently    Alcohol/week: 0.0 standard drinks    Comment: Occassionally  . Drug use: Not Currently    Types: Cocaine    Comment: UDS + 11/22/17    Home Medications Prior to Admission medications   Medication Sig Start Date End Date Taking? Authorizing Provider  cyclobenzaprine (FLEXERIL) 10 MG tablet Take 1 tablet (10 mg total) by mouth 2 (two) times daily as needed for muscle spasms. 09/12/18   Lucrezia Starch, MD  gabapentin (NEURONTIN) 800 MG tablet Take 800 mg by mouth 3 (three) times daily. 08/04/19   [provider]  hydrochlorothiazide (HYDRODIURIL) 50 MG tablet Take 50 mg by mouth daily. 08/04/19   [provider]  ketorolac (TORADOL) 10 MG tablet Take 1 tablet (10 mg total) by mouth every 6 (six) hours as needed. Patient not taking: Reported on 10/29/2019 05/05/18   Darlina Rumpf, CNM  methocarbamol (ROBAXIN) 500 MG tablet Take 1 tablet (500 mg total) by mouth 2 (two) times daily. Patient taking differently: Take 500 mg by mouth every 6 (six) hours as needed for muscle spasms.  09/02/18   Domenic Moras, PA-C  naproxen (NAPROSYN) 500 MG tablet Take 1 tablet (500 mg total) by mouth 2 (two) times daily. Prn pain and inflammation 01/25/20   Rodriguez-Southworth, Sunday Spillers, PA-C  NARCAN 4 MG/0.1ML LIQD nasal spray kit Place 1 spray into the nose once as needed (overdose).  10/17/19   [provider]  oxyCODONE-acetaminophen (PERCOCET) 10-325 MG tablet Take 1 tablet by mouth 4 (four) times daily as needed for pain.  08/05/19   [provider]  OZEMPIC, 0.25 OR 0.5 MG/DOSE, 2 MG/1.5ML SOPN Inject 0.25 mg into the skin once a week.  10/26/19   [provider]    Allergies    Patient has no known allergies.  Review of Systems   Review of Systems  Unable to perform ROS: Patient unresponsive  Neurological: Positive for seizures.    Physical Exam Updated Vital Signs BP (!) 146/93   Pulse 86   Temp 97.9 F (36.6 C)  (Oral)   Resp 20   SpO2 100%   Physical Exam Vitals and nursing note reviewed.  Constitutional:      Appearance: She is well-developed. She is obese.  HENT:     Head: Normocephalic and atraumatic.     Mouth/Throat:     Mouth: Mucous membranes are moist.  Cardiovascular:     Rate and Rhythm: Normal rate and regular  rhythm.     Heart sounds: No murmur heard.   Pulmonary:     Effort: Pulmonary effort is normal.     Breath sounds: Normal breath sounds. No wheezing, rhonchi or rales.  Abdominal:     Palpations: Abdomen is soft.     Tenderness: There is no abdominal tenderness. There is no guarding or rebound.  Musculoskeletal:     Cervical back: Normal range of motion and neck supple.  Skin:    General: Skin is warm and dry.     Findings: No rash.  Neurological:     Comments: Patient in active generalized tonic clonic seizure     ED Results / Procedures / Treatments   Labs (all labs ordered are listed, but only abnormal results are displayed) Labs Reviewed  CBC WITH DIFFERENTIAL/PLATELET - Abnormal; Notable for the following components:      Result Value   Hemoglobin 11.4 (*)    RDW 15.8 (*)    All other components within normal limits  COMPREHENSIVE METABOLIC PANEL - Abnormal; Notable for the following components:   Calcium 8.5 (*)    All other components within normal limits  RAPID URINE DRUG SCREEN, HOSP PERFORMED - Abnormal; Notable for the following components:   Benzodiazepines POSITIVE (*)    Tetrahydrocannabinol POSITIVE (*)    All other components within normal limits  URINALYSIS, ROUTINE W REFLEX MICROSCOPIC - Abnormal; Notable for the following components:   Color, Urine STRAW (*)    Leukocytes,Ua MODERATE (*)    Bacteria, UA RARE (*)    All other components within normal limits  ETHANOL - Abnormal; Notable for the following components:   Alcohol, Ethyl (B) 94 (*)    All other components within normal limits  PREGNANCY, URINE  I-STAT BETA HCG BLOOD, ED  ([facility], [facility], [facility] ONLY)  CBG MONITORING, ED   Results for orders placed or performed during the hospital encounter of 06/16/20  CBC with Differential  Result Value Ref Range   WBC 7.7 4.0 - 10.5 K/uL   RBC 4.25 3.87 - 5.11 MIL/uL   Hemoglobin 11.4 (L) 12.0 - 15.0 g/dL   HCT 37.1 36.0 - 46.0 %   MCV 87.3 80.0 - 100.0 fL   MCH 26.8 26.0 - 34.0 pg   MCHC 30.7 30.0 - 36.0 g/dL   RDW 15.8 (H) 11.5 - 15.5 %   Platelets 170 150 - 400 K/uL   nRBC 0.0 0.0 - 0.2 %   Neutrophils Relative % 55 %   Neutro Abs 4.4 1.7 - 7.7 K/uL   Lymphocytes Relative 35 %   Lymphs Abs 2.7 0.7 - 4.0 K/uL   Monocytes Relative 7 %   Monocytes Absolute 0.5 0.1 - 1.0 K/uL   Eosinophils Relative 2 %   Eosinophils Absolute 0.1 0.0 - 0.5 K/uL   Basophils Relative 1 %   Basophils Absolute 0.1 0.0 - 0.1 K/uL   Immature Granulocytes 0 %   Abs Immature Granulocytes 0.01 0.00 - 0.07 K/uL  Comprehensive metabolic panel  Result Value Ref Range   Sodium 140 135 - 145 mmol/L   Potassium 3.8 3.5 - 5.1 mmol/L   Chloride 109 98 - 111 mmol/L   CO2 22 22 - 32 mmol/L   Glucose, Bld 77 70 - 99 mg/dL   BUN 15 6 - 20 mg/dL   Creatinine, Ser 0.69 0.44 - 1.00 mg/dL   Calcium 8.5 (L) 8.9 - 10.3 mg/dL   Total Protein 7.1 6.5 - 8.1 g/dL  Albumin 3.6 3.5 - 5.0 g/dL   AST 22 15 - 41 U/L   ALT 21 0 - 44 U/L   Alkaline Phosphatase 61 38 - 126 U/L   Total Bilirubin 0.6 0.3 - 1.2 mg/dL   GFR, Estimated >60 >60 mL/min   Anion gap 9 5 - 15  Urine rapid drug screen (hosp performed)  Result Value Ref Range   Opiates NONE DETECTED NONE DETECTED   Cocaine NONE DETECTED NONE DETECTED   Benzodiazepines POSITIVE (A) NONE DETECTED   Amphetamines NONE DETECTED NONE DETECTED   Tetrahydrocannabinol POSITIVE (A) NONE DETECTED   Barbiturates NONE DETECTED NONE DETECTED  Urinalysis, Routine w reflex microscopic Urine, Clean Catch  Result Value Ref Range   Color, Urine STRAW (A) YELLOW   APPearance CLEAR CLEAR   Specific Gravity, Urine 1.009  1.005 - 1.030   pH 6.0 5.0 - 8.0   Glucose, UA NEGATIVE NEGATIVE mg/dL   Hgb urine dipstick NEGATIVE NEGATIVE   Bilirubin Urine NEGATIVE NEGATIVE   Ketones, ur NEGATIVE NEGATIVE mg/dL   Protein, ur NEGATIVE NEGATIVE mg/dL   Nitrite NEGATIVE NEGATIVE   Leukocytes,Ua MODERATE (A) NEGATIVE   RBC / HPF 0-5 0 - 5 RBC/hpf   WBC, UA 0-5 0 - 5 WBC/hpf   Bacteria, UA RARE (A) NONE SEEN   Squamous Epithelial / LPF 0-5 0 - 5   Mucus PRESENT   Ethanol  Result Value Ref Range   Alcohol, Ethyl (B) 94 (H) <10 mg/dL  Pregnancy, urine  Result Value Ref Range   Preg Test, Ur NEGATIVE NEGATIVE  CBG monitoring, ED  Result Value Ref Range   Glucose-Capillary 81 70 - 99 mg/dL    EKG None  Radiology CT Head Wo Contrast  Result Date: 06/16/2020 CLINICAL DATA:  Seizure EXAM: CT HEAD WITHOUT CONTRAST TECHNIQUE: Contiguous axial images were obtained from the base of the skull through the vertex without intravenous contrast. COMPARISON:  10/29/2019 FINDINGS: Brain: No acute infarct or hemorrhage. Lateral ventricles and midline structures are unremarkable. No acute extra-axial fluid collections. No mass effect. Vascular: No hyperdense vessel or unexpected calcification. Skull: Normal. Negative for fracture or focal lesion. Sinuses/Orbits: No acute finding. Other: None. IMPRESSION: 1. Stable head CT, no acute intracranial process. Electronically Signed   By: Randa Ngo M.D.   On: 06/16/2020 03:24   CT Cervical Spine Wo Contrast  Result Date: 06/16/2020 CLINICAL DATA:  Seizure, fell EXAM: CT CERVICAL SPINE WITHOUT CONTRAST TECHNIQUE: Multidetector CT imaging of the cervical spine was performed without intravenous contrast. Multiplanar CT image reconstructions were also generated. COMPARISON:  06/17/2005 FINDINGS: Alignment: Straightening of the cervical spine is likely positional. Otherwise alignment is anatomic. Skull base and vertebrae: No acute fracture. No primary bone lesion or focal pathologic process.  Soft tissues and spinal canal: No prevertebral fluid or swelling. No visible canal hematoma. Disc levels:  No significant spondylosis or facet hypertrophy. Upper chest: Airway is patent.  Lung apices are clear. Other: Reconstructed images demonstrate no additional findings. IMPRESSION: 1. No acute cervical spine fracture. Electronically Signed   By: Randa Ngo M.D.   On: 06/16/2020 03:26   CT Head Wo Contrast  Result Date: 06/16/2020 CLINICAL DATA:  Seizure EXAM: CT HEAD WITHOUT CONTRAST TECHNIQUE: Contiguous axial images were obtained from the base of the skull through the vertex without intravenous contrast. COMPARISON:  10/29/2019 FINDINGS: Brain: No acute infarct or hemorrhage. Lateral ventricles and midline structures are unremarkable. No acute extra-axial fluid collections. No mass effect. Vascular: No hyperdense vessel  or unexpected calcification. Skull: Normal. Negative for fracture or focal lesion. Sinuses/Orbits: No acute finding. Other: None. IMPRESSION: 1. Stable head CT, no acute intracranial process. Electronically Signed   By: Randa Ngo M.D.   On: 06/16/2020 03:24   CT Cervical Spine Wo Contrast  Result Date: 06/16/2020 CLINICAL DATA:  Seizure, fell EXAM: CT CERVICAL SPINE WITHOUT CONTRAST TECHNIQUE: Multidetector CT imaging of the cervical spine was performed without intravenous contrast. Multiplanar CT image reconstructions were also generated. COMPARISON:  06/17/2005 FINDINGS: Alignment: Straightening of the cervical spine is likely positional. Otherwise alignment is anatomic. Skull base and vertebrae: No acute fracture. No primary bone lesion or focal pathologic process. Soft tissues and spinal canal: No prevertebral fluid or swelling. No visible canal hematoma. Disc levels:  No significant spondylosis or facet hypertrophy. Upper chest: Airway is patent.  Lung apices are clear. Other: Reconstructed images demonstrate no additional findings. IMPRESSION: 1. No acute cervical spine  fracture. Electronically Signed   By: Randa Ngo M.D.   On: 06/16/2020 03:26    Procedures Procedures CRITICAL CARE Performed by: Dewaine Oats   Total critical care time: 50 minutes  Critical care time was exclusive of separately billable procedures and treating other patients.  Critical care was necessary to treat or prevent imminent or life-threatening deterioration.  Critical care was time spent personally by me on the following activities: development of treatment plan with patient and/or surrogate as well as nursing, discussions with consultants, evaluation of patient's response to treatment, examination of patient, obtaining history from patient or surrogate, ordering and performing treatments and interventions, ordering and review of laboratory studies, ordering and review of radiographic studies, pulse oximetry and re-evaluation of patient's condition.   Medications Ordered in ED Medications  LORazepam (ATIVAN) 2 MG/ML injection (2 mg  Given 06/16/20 0202)  levETIRAcetam (KEPPRA) IVPB 1500 mg/ 100 mL premix (0 mg Intravenous Stopped 06/16/20 0308)    ED Course  I have reviewed the triage vital signs and the nursing notes.  Pertinent labs & imaging results that were available during my care of the patient were reviewed by me and considered in my medical decision making (see chart for details).    MDM Rules/Calculators/A&P                          Seizing on my initial exam. IV Ativan 2 mg given with resolution. Keppra load with 1.5 gms ordered.   Hypertensive, no hypoxia, tachycardia. She returns to baseline quickly with short duration post-ictal period. Able to text on her phone demonstrating intact fine motor skills. She is oriented, alert. No neurologic deficits.   Discussed with neuro-hospitalist who advises she can be admitted at [facility] if EEG is available on weekends, which she will need in the morning (Saturday). [facility] confirms availability of the test. Anticipate  admission after testing complete. Will monitor for further seizure activity.   4:00 - no further seizure activity in ED. She is somnolent from receiving Keppra but wakes to verbal stimuli before going back to sleep. VSS. CT head and neck negative. C-collar removed by me. Labs remarkable only for ETOH 94, UDS positive for benzos and THC.  Will admit for observation and morning EEG.   Final Clinical Impression(s) / ED Diagnoses Final diagnoses:  None   1. Multiple seizures  Rx / DC Orders ED Discharge Orders    None       Charlann Lange, PA-C 06/16/20 0416    Palumbo,  April, MD 06/16/20 [id number]

## 2020-06-16 NOTE — ED Notes (Signed)
Patient placed on cardiac monitor.

## 2020-06-16 NOTE — H&P (Signed)
History and Physical    Jacqueline Clarke:[id number] DOB: 1986-01-08 DOA: 06/16/2020  PCP: [facility] [place]  Patient coming from: Home  Chief Complaint: altered mental status  HPI: Jacqueline Clarke is a 35 y.o. female with medical history significant of HTN. Presenting with altered mental status. She is unable to tell me why she is in the hospital. She is in and out of sleep during interview. History is from chart review. Apparently the patient was leaving a hotel room when she felt to the ground with seizure-like activity. EMS was alerted and she was transported to the ED. It was noted that she had an additional seizure en route. She was given versed in the field. She was post-ictal on arrival.   ED Course: At initial eval of the patient by ED, there was seizure -like activity. She was given IV ativan and keppra. Neurology was consulted. TRH was called for admission.   Review of Systems:  Unable to obtain d/t mentation.   PMHx Past Medical History:  Diagnosis Date  . Anemia   . Anxiety   . Asthma   . Bipolar disorder ([place])   . Blood type, Rh negative   . Cholelithiasis   . Chronic back pain    scoliosis- on percocet  . Depression   . Headache(784.0)   . History of cocaine use   . History of ileus 07/06/2016   After cesarean section  . Hyperemesis complicating pregnancy, antepartum 2013  . 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 Low vitamin D level 01/03/2016  . Obesity   . Pregnancy induced hypertension   . Pyelonephritis 07/03/2016   postpartum  . Scoliosis   . Scoliosis   . Supervision of high risk pregnancy, antepartum 10/07/2017    Nursing Staff Provider Office Location  [facility] Dating   Language  English Anatomy US   Flu Vaccine  Declined 02/02/2018 Genetic Screen  NIPS:   AFP:   First Screen:  Quad:   TDaP vaccine   declined  02/02/2018 Hgb A1C or  GTT Early  Third trimester  Rhogam   02/02/2018   LAB RESULTS  Feeding  Plan Breast/Bottle Blood Type O/Negative/-- (09/18 1525) ONeg Contraception BTL? Antibody Negative (09/18    PSHx Past Surgical History:  Procedure Laterality Date  . arm surgery    . CESAREAN SECTION N/A 06/23/2016   Procedure: CESAREAN SECTION;  Surgeon: Aletha Halim, MD;  Location: [place];  Service: Obstetrics;  Laterality: N/A;  . CHOLECYSTECTOMY    . DILATION AND CURETTAGE OF UTERUS  2008  . MR WRIST RIGHT     sx to nerves  . NERVE, TENDON AND ARTERY REPAIR Right 06/18/2014   Procedure: NERVE, TENDON AND ARTERY REPAIR;  Surgeon: Iran Planas, MD;  Location: [place];  Service: Orthopedics;  Laterality: Right;  . TONSILLECTOMY AND ADENOIDECTOMY    . TUBAL LIGATION N/A 04/01/2018   Procedure: POST PARTUM TUBAL LIGATION;  Surgeon: Osborne Oman, MD;  [facility]: [facility];  Service: Gynecology;  Laterality: N/A;  . VAGINAL DELIVERY  06/23/2016   Procedure: VAGINAL DELIVERY;  Surgeon: Aletha Halim, MD;  Location: [place];  Service: Obstetrics;;    SocHx  reports that she quit smoking about 2 years ago. Her smoking use included cigarettes. She smoked 0.25 packs per day. She has never used smokeless tobacco. She reports previous alcohol use. She reports previous drug use. Drug: Cocaine.  No Known Allergies  FamHx Family History  Problem Relation Age of Onset  . Heart disease Maternal Grandfather   . Diabetes Maternal Grandfather   . Heart disease Father   . Anesthesia problems Neg Hx     Prior to Admission medications   Medication Sig Start Date End Date Taking? Authorizing Provider  etodolac (LODINE XL) 600 MG 24 hr tablet Take 600 mg by mouth daily. 05/18/20  Yes [provider]  gabapentin (NEURONTIN) 800 MG tablet Take 800 mg by mouth 3 (three) times daily. 08/04/19  Yes [provider]  hydrochlorothiazide (HYDRODIURIL) 50 MG tablet Take 50 mg by mouth daily. 08/04/19  Yes [provider]  NARCAN 4 MG/0.1ML LIQD nasal spray  kit Place 1 spray into the nose once as needed (overdose).  10/17/19  Yes [provider]  oxyCODONE-acetaminophen (PERCOCET) 10-325 MG tablet Take 1 tablet by mouth 4 (four) times daily as needed for pain.  08/05/19  Yes [provider]  OZEMPIC, 0.25 OR 0.5 MG/DOSE, 2 MG/1.5ML SOPN Inject 0.25 mg into the skin once a week. Friday 10/26/19  Yes [provider]  tiZANidine (ZANAFLEX) 4 MG tablet Take 6 mg by mouth 3 (three) times daily as needed for muscle spasms. 05/16/20  Yes [provider]  cyclobenzaprine (FLEXERIL) 10 MG tablet Take 1 tablet (10 mg total) by mouth 2 (two) times daily as needed for muscle spasms. Patient not taking: Reported on 06/16/2020 09/12/18   Lucrezia Starch, MD  ketorolac (TORADOL) 10 MG tablet Take 1 tablet (10 mg total) by mouth every 6 (six) hours as needed. Patient not taking: No sig reported 05/05/18   Darlina Rumpf, CNM  methocarbamol (ROBAXIN) 500 MG tablet Take 1 tablet (500 mg total) by mouth 2 (two) times daily. Patient not taking: Reported on 06/16/2020 09/02/18   Domenic Moras, PA-C  naproxen (NAPROSYN) 500 MG tablet Take 1 tablet (500 mg total) by mouth 2 (two) times daily. Prn pain and inflammation Patient not taking: No sig reported 01/25/20   Rodriguez-Southworth, [place], PA-C    Physical Exam: Vitals:   06/16/20 0430 06/16/20 0530 06/16/20 0600 06/16/20 0630  BP: 125/68 137/68 (!) 144/70 (!) 180/118  Pulse: 90 87 88 (!) 102  Resp: 18 (!) 21 (!) 22 (!) 22  Temp:      TempSrc:      SpO2: 97% 98% 96% 100%    General: 35 y.o. female resting in bed in NAD Eyes: PERRL, normal sclera ENMT: Nares patent w/o discharge, orophaynx clear, dentition normal, ears w/o discharge/lesions/ulcers Neck: Supple, trachea midline Cardiovascular: RRR, +S1, S2, no m/g/r, equal pulses throughout Respiratory: CTABL, no w/r/r, normal WOB GI: BS+, NDNT, no masses noted, no organomegaly noted MSK: No e/c/c Skin: No rashes, bruises,  ulcerations noted Neuro: A&O x 3, no focal deficits Psyc: Drowsy, answers one or two questions with repeated verbal command, but then goes back to sleep  Labs on Admission: I have personally reviewed following labs and imaging studies  CBC: Recent Labs  Lab 06/16/20 0235  WBC 7.7  NEUTROABS 4.4  HGB 11.4*  HCT 37.1  MCV 87.3  PLT 116   Basic Metabolic Panel: Recent Labs  Lab 06/16/20 0235  NA 140  K 3.8  CL 109  CO2 22  GLUCOSE 77  BUN 15  CREATININE 0.69  CALCIUM 8.5*   GFR: CrCl cannot be calculated (Unknown ideal weight.). Liver Function Tests: Recent Labs  Lab 06/16/20 0235  AST 22  ALT 21  ALKPHOS 61  BILITOT 0.6  PROT  7.1  ALBUMIN 3.6   No results for input(s): LIPASE, AMYLASE in the last 168 hours. No results for input(s): AMMONIA in the last 168 hours. Coagulation Profile: No results for input(s): INR, PROTIME in the last 168 hours. Cardiac Enzymes: No results for input(s): CKTOTAL, CKMB, CKMBINDEX, TROPONINI in the last 168 hours. BNP (last 3 results) No results for input(s): PROBNP in the last 8760 hours. HbA1C: No results for input(s): HGBA1C in the last 72 hours. CBG: Recent Labs  Lab 06/16/20 0248  GLUCAP 81   Lipid Profile: No results for input(s): CHOL, HDL, LDLCALC, TRIG, CHOLHDL, LDLDIRECT in the last 72 hours. Thyroid Function Tests: No results for input(s): TSH, T4TOTAL, FREET4, T3FREE, THYROIDAB in the last 72 hours. Anemia Panel: No results for input(s): VITAMINB12, FOLATE, FERRITIN, TIBC, IRON, RETICCTPCT in the last 72 hours. Urine analysis:    Component Value Date/Time   COLORURINE STRAW (A) 06/16/2020 0255   APPEARANCEUR CLEAR 06/16/2020 0255   LABSPEC 1.009 06/16/2020 0255   PHURINE 6.0 06/16/2020 0255   GLUCOSEU NEGATIVE 06/16/2020 0255   HGBUR NEGATIVE 06/16/2020 0255   BILIRUBINUR NEGATIVE 06/16/2020 0255   BILIRUBINUR negative 12/26/2015 1628   KETONESUR NEGATIVE 06/16/2020 0255   PROTEINUR NEGATIVE 06/16/2020  0255   UROBILINOGEN 0.2 11/09/2016 2114   NITRITE NEGATIVE 06/16/2020 0255   LEUKOCYTESUR MODERATE (A) 06/16/2020 0255    Radiological Exams on Admission: CT Head Wo Contrast  Result Date: 06/16/2020 CLINICAL DATA:  Seizure EXAM: CT HEAD WITHOUT CONTRAST TECHNIQUE: Contiguous axial images were obtained from the base of the skull through the vertex without intravenous contrast. COMPARISON:  10/29/2019 FINDINGS: Brain: No acute infarct or hemorrhage. Lateral ventricles and midline structures are unremarkable. No acute extra-axial fluid collections. No mass effect. Vascular: No hyperdense vessel or unexpected calcification. Skull: Normal. Negative for fracture or focal lesion. Sinuses/Orbits: No acute finding. Other: None. IMPRESSION: 1. Stable head CT, no acute intracranial process. Electronically Signed   By: Randa Ngo M.D.   On: 06/16/2020 03:24   CT Cervical Spine Wo Contrast  Result Date: 06/16/2020 CLINICAL DATA:  Seizure, fell EXAM: CT CERVICAL SPINE WITHOUT CONTRAST TECHNIQUE: Multidetector CT imaging of the cervical spine was performed without intravenous contrast. Multiplanar CT image reconstructions were also generated. COMPARISON:  06/17/2005 FINDINGS: Alignment: Straightening of the cervical spine is likely positional. Otherwise alignment is anatomic. Skull base and vertebrae: No acute fracture. No primary bone lesion or focal pathologic process. Soft tissues and spinal canal: No prevertebral fluid or swelling. No visible canal hematoma. Disc levels:  No significant spondylosis or facet hypertrophy. Upper chest: Airway is patent.  Lung apices are clear. Other: Reconstructed images demonstrate no additional findings. IMPRESSION: 1. No acute cervical spine fracture. Electronically Signed   By: Randa Ngo M.D.   On: 06/16/2020 03:26    EKG: None obtained in ED  Assessment/Plan Acute metabolic encephalopathy Seizure-like activity     - admitted to tele, obs     - neuro to see      - keppra, ativan administered; defer further regimen to neurology     - MRI ordered  EtOH use Marijuana abuse     - positive EtOH levels     - positive mj levels     - counsel against further use  HTN     - PRN metoprolol     - continue HCTZ  DM2?     - she is on ozempic     - check A1c     -  carb mod diet, glucose checks  DVT prophylaxis: SCDs  Code Status: FULL  Family Communication: None at bedside  Consults called: EDP spoke with neurology   Status is: Observation  The patient remains OBS appropriate and will d/c before 2 midnights.  Dispo: The patient is from: Home              Anticipated d/c is to: Home              Patient currently is not medically stable to d/c.   Difficult to place patient No  Jonnie Finner DO [facility]  If 7PM-7AM, please contact night-coverage [email or web address]  06/16/2020, 8:29 AM

## 2020-06-16 NOTE — ED Triage Notes (Addendum)
Pt BIB [facility] from [employer]. Friends reports a seizure. Pt was postictal on EMS arrival. She had one seizure with EMS in their truck. 5mg  midazolam given en route. Ccollar in place as patient fell to the ground after first seizure-like activity. States that she used to be on medication, but has not had any in a "very long time."

## 2021-12-16 ENCOUNTER — Encounter (HOSPITAL_COMMUNITY): Payer: Self-pay

## 2021-12-16 ENCOUNTER — Ambulatory Visit (INDEPENDENT_AMBULATORY_CARE_PROVIDER_SITE_OTHER): Payer: Medicaid Other

## 2021-12-16 ENCOUNTER — Ambulatory Visit (HOSPITAL_COMMUNITY)
Admission: EM | Admit: 2021-12-16 | Discharge: 2021-12-16 | Disposition: A | Discharge status: Home / Self Care | Payer: Medicaid Other | Attending: Physician Assistant | Admitting: Physician Assistant

## 2021-12-16 DIAGNOSIS — S62644A Nondisplaced fracture of proximal phalanx of right ring finger, initial encounter for closed fracture: Secondary | ICD-10-CM | POA: Diagnosis not present

## 2021-12-16 DIAGNOSIS — M79641 Pain in right hand: Secondary | ICD-10-CM

## 2021-12-16 NOTE — ED Triage Notes (Signed)
Injury to finger on the right hand due to a fall yesterday. Py has pain and swelling

## 2021-12-16 NOTE — Discharge Instructions (Signed)
You have a small fracture in your finger.  Please keep this finger splinted to provide protection and comfort.  You can use your prescribed oxycodone as well as over-the-counter medications such as ibuprofen for pain.  Please follow-up with hand specialist as soon as possible; call to schedule appointment with [facility] as soon as you leave here.  As we discussed, if you have any worsening symptoms including increased pain, difficulty moving the finger, numbness/tingling that is worsening you need to go to the emergency room immediately.

## 2021-12-16 NOTE — ED Provider Notes (Signed)
[place]    CSN: [id number] Arrival date & time: 12/16/21  1115      History   Chief Complaint Chief Complaint  Patient presents with   Finger Injury    HPI Jacqueline Clarke is a 36 y.o. female.   Patient presents today with a 24-hour history of finger pain and swelling in her right hand.  Reports that she tripped and fell and caught herself with her right hand jamming to the counter yesterday.  She initially did not have severe pain but this worsened overnight.  She did take a prescribed oxycodone which provided only temporary relief of symptoms.  She has not had any pain medication today.  She reports that pain is rated 10 on a 0-10 pain scale, localized to ring finger and small finger of her right hand, described as throbbing, worse with movement, no alleviating factors identified.  She reports numbness in the ring finger as well as decreased range of motion.  She is having difficulty with daily activities as a result of symptoms.  She is right-handed.  She is confident that she is not pregnant.    Past Medical History:  Diagnosis Date   Anemia    Anxiety    Asthma    Bipolar disorder ([place])    Blood type, Rh negative    Cholelithiasis    Chronic back pain    scoliosis- on percocet   Depression    Headache(784.0)    History of cocaine use    History of ileus 07/06/2016   After cesarean section   Hyperemesis complicating pregnancy, antepartum 2013   Infection    hx MRSA; 3 negative tests since   Low vitamin D level 01/03/2016   Obesity    Pregnancy induced hypertension    Pyelonephritis 07/03/2016   postpartum   Scoliosis    Scoliosis    Supervision of high risk pregnancy, antepartum 10/07/2017    Nursing Staff Provider Office Location  [facility] Dating   Language  English Anatomy US   Flu Vaccine  Declined 02/02/2018 Genetic Screen  NIPS:   AFP:   First Screen:  Quad:   TDaP vaccine   declined  02/02/2018 Hgb A1C or  GTT Early  Third trimester   Rhogam   02/02/2018   LAB RESULTS  Feeding Plan Breast/Bottle Blood Type O/Negative/-- (09/18 1525) ONeg Contraception BTL? Antibody Negative (09/18    Patient Active Problem List   Diagnosis Date Noted   Seizure ([place]) 06/16/2020   Gonorrhea 08/15/2019   Neck pain 08/13/2019   Bacteremia due to Gram-negative bacteria 08/13/2019   Ground glass opacity present on imaging of lung 08/13/2019   Hypertension 08/11/2019   Cough 08/11/2019   Right lateral abdominal pain 08/11/2019   Obesity (BMI 30-39.9) 08/11/2019   Normocytic anemia 08/10/2019   Chronic back pain 08/10/2019   Thrombophlebitis 04/07/2018   H/O: C-section 03/26/2018   Traumatic injury during pregnancy 09/28/2017   Adjustment insomnia 07/09/2016   History of gestational hypertension 06/30/2016   Morbid obesity ([place])     Past Surgical History:  Procedure Laterality Date   arm surgery     CESAREAN SECTION N/A 06/23/2016   Procedure: CESAREAN SECTION;  Surgeon: Aletha Halim, MD;  Location: [place];  Service: Obstetrics;  Laterality: N/A;   CHOLECYSTECTOMY     DILATION AND CURETTAGE OF UTERUS  2008   MR WRIST RIGHT     sx to nerves   NERVE, TENDON AND ARTERY REPAIR Right 06/18/2014  Procedure: NERVE, TENDON AND ARTERY REPAIR;  Surgeon: Iran Planas, MD;  Location: [place];  Service: Orthopedics;  Laterality: Right;   TONSILLECTOMY AND ADENOIDECTOMY     TUBAL LIGATION N/A 04/01/2018   Procedure: POST PARTUM TUBAL LIGATION;  Surgeon: Osborne Oman, MD;  [facility];  Service: Gynecology;  Laterality: N/A;   VAGINAL DELIVERY  06/23/2016   Procedure: VAGINAL DELIVERY;  Surgeon: Aletha Halim, MD;  Location: [place];  Service: Obstetrics;;    OB History     Gravida  8   Para  6   Term  5   Preterm  1   AB  2   Living  7      SAB  2   IAB  0   Ectopic  0   Multiple  1   Live Births  7            Home Medications    Prior to Admission medications   Medication  Sig Start Date End Date Taking? Authorizing Provider  cyclobenzaprine (FLEXERIL) 10 MG tablet Take 1 tablet (10 mg total) by mouth 2 (two) times daily as needed for muscle spasms. Patient not taking: Reported on 06/16/2020 09/12/18   Lucrezia Starch, MD  etodolac (LODINE XL) 600 MG 24 hr tablet Take 600 mg by mouth daily. 05/18/20   [provider]  gabapentin (NEURONTIN) 800 MG tablet Take 800 mg by mouth 3 (three) times daily. 08/04/19   [provider]  hydrochlorothiazide (HYDRODIURIL) 50 MG tablet Take 50 mg by mouth daily. 08/04/19   [provider]  ketorolac (TORADOL) 10 MG tablet Take 1 tablet (10 mg total) by mouth every 6 (six) hours as needed. Patient not taking: Reported on 10/29/2019 05/05/18   Darlina Rumpf, CNM  methocarbamol (ROBAXIN) 500 MG tablet Take 1 tablet (500 mg total) by mouth 2 (two) times daily. Patient not taking: Reported on 06/16/2020 09/02/18   Domenic Moras, PA-C  naproxen (NAPROSYN) 500 MG tablet Take 1 tablet (500 mg total) by mouth 2 (two) times daily. Prn pain and inflammation Patient not taking: Reported on 06/16/2020 01/25/20   Rodriguez-Southworth, Sunday [place], PA-C  NARCAN 4 MG/0.1ML LIQD nasal spray kit Place 1 spray into the nose once as needed (overdose).  10/17/19   [provider]  oxyCODONE-acetaminophen (PERCOCET) 10-325 MG tablet Take 1 tablet by mouth 4 (four) times daily as needed for pain.  08/05/19   [provider]  OZEMPIC, 0.25 OR 0.5 MG/DOSE, 2 MG/1.5ML SOPN Inject 0.25 mg into the skin once a week. Friday 10/26/19   [provider]  tiZANidine (ZANAFLEX) 4 MG tablet Take 6 mg by mouth 3 (three) times daily as needed for muscle spasms. 05/16/20   [provider]    Family History Family History  Problem Relation Age of Onset   Heart disease Maternal Grandfather    Diabetes Maternal Grandfather    Heart disease Father    Anesthesia problems Neg Hx     Social History Social History    Tobacco Use   Smoking status: Former    Packs/day: 0.25    Types: Cigarettes    Quit date: 08/02/2017    Years since quitting: 4.3   Smokeless tobacco: Never  Vaping Use   Vaping Use: Never used  Substance Use Topics   Alcohol use: Not Currently    Alcohol/week: 0.0 standard drinks of alcohol    Comment: Occassionally   Drug use: Not Currently  Types: Cocaine    Comment: UDS + 11/22/17     Allergies   Patient has no known allergies.   Review of Systems Review of Systems  Constitutional:  Positive for activity change. Negative for appetite change, fatigue and fever.  Gastrointestinal:  Negative for abdominal pain, diarrhea, nausea and vomiting.  Musculoskeletal:  Positive for arthralgias, joint swelling and myalgias.  Skin:  Negative for color change.  Neurological:  Positive for numbness. Negative for weakness.     Physical Exam Triage Vital Signs ED Triage Vitals  Enc Vitals Group     BP 12/16/21 1328 (!) 131/95     Pulse Rate 12/16/21 1328 78     Resp 12/16/21 1328 12     Temp 12/16/21 1328 98 F (36.7 C)     Temp Source 12/16/21 1328 Oral     SpO2 12/16/21 1328 98 %     Weight --      Height --      Head Circumference --      Peak Flow --      Pain Score 12/16/21 1324 10     Pain Loc --      Pain Edu? --      Excl. in [place]? --    No data found.  Updated Vital Signs BP (!) 131/95 (BP Location: Right Arm)   Pulse 78   Temp 98 F (36.7 C) (Oral)   Resp 12   LMP 12/14/2021   SpO2 98%   Visual Acuity Right Eye Distance:   Left Eye Distance:   Bilateral Distance:    Right Eye Near:   Left Eye Near:    Bilateral Near:     Physical Exam Vitals reviewed.  Constitutional:      General: She is awake. She is not in acute distress.    Appearance: Normal appearance. She is well-developed. She is not ill-appearing.     Comments: Very pleasant female appears stated age in no acute distress sitting comfortably in exam room  HENT:     Head:  Normocephalic and atraumatic.  Cardiovascular:     Rate and Rhythm: Normal rate and regular rhythm.     Heart sounds: Normal heart sounds, S1 normal and S2 normal. No murmur heard.    Comments: Capillary refill within 3 seconds right fingers Pulmonary:     Effort: Pulmonary effort is normal.     Breath sounds: Normal breath sounds. No wheezing, rhonchi or rales.  Musculoskeletal:     Right hand: Swelling, tenderness and bony tenderness present. Decreased range of motion. Decreased strength. Decreased sensation. There is disruption of two-point discrimination.     Comments: Right hand: Significant swelling and decreased range of motion of ring finger with distal ecchymosis.  Decreased sensation based on 2 point discrimination noted distal ring finger.  Tenderness palpation over proximal fifth finger without deformity.  Normal active range of motion and fifth finger is neurovascularly intact.  Psychiatric:        Behavior: Behavior is cooperative.      UC Treatments / Results  Labs (all labs ordered are listed, but only abnormal results are displayed) Labs Reviewed - No data to display  EKG   Radiology DG Hand Complete Right  Result Date: 12/16/2021 CLINICAL DATA:  Finger pain and swelling after an injury after a fall. Initial encounter. EXAM: RIGHT HAND - COMPLETE 3+ VIEW COMPARISON:  None Available. FINDINGS: Tiny osseous fragment is seen adjacent to the fourth proximal interphalangeal joint. Overlying soft tissue  swelling. No additional evidence of an acute fracture. IMPRESSION: Tiny avulsion fracture along the fourth proximal interphalangeal joint. Electronically Signed   By: Lorin Picket M.D.   On: 12/16/2021 14:03    Procedures Procedures (including critical care time)  Medications Ordered in UC Medications - No data to display  Initial Impression / Assessment and Plan / UC Course  I have reviewed the triage vital signs and the nursing notes.  Pertinent labs & imaging  results that were available during my care of the patient were reviewed by me and considered in my medical decision making (see chart for details).     X-ray obtained showed avulsion fracture along the fifth proximal interphalangeal joint.  Contacted Hilbert Odor, PA to discuss case who recommended splinting and having her follow-up outpatient next week with [facility]/hand specialist.  We did discuss numbness/paresthesias that this is likely related to swelling but if she has any worsening symptoms she needs to be seen immediately.  She is prescribed oxycodone will continue using this medication as well as over-the-counter analgesics for pain relief.  She was placed in a splint with instruction to keep this on until she is evaluated by orthopedics.  Recommended that she contact [facility] and schedule an appointment as soon as possible; she will call them as soon as she leaves here today to be seen as soon as possible.  Discussed that if she has any changing or worsening symptoms including increased pain, spread of numbness/paresthesias, decreased range of motion, change in color she needs to go to the emergency room immediately.  Strict return precautions given.  Work excuse note provided.  Final Clinical Impressions(s) / UC Diagnoses   Final diagnoses:  Closed nondisplaced fracture of proximal phalanx of right ring finger, initial encounter     Discharge Instructions      You have a small fracture in your finger.  Please keep this finger splinted to provide protection and comfort.  You can use your prescribed oxycodone as well as over-the-counter medications such as ibuprofen for pain.  Please follow-up with hand specialist as soon as possible; call to schedule appointment with [facility] as soon as you leave here.  As we discussed, if you have any worsening symptoms including increased pain, difficulty moving the finger, numbness/tingling that is worsening you need to go to the emergency  room immediately.     ED Prescriptions   None    PDMP not reviewed this encounter.   Terrilee Croak, PA-C 12/16/21 1418

## 2022-02-24 ENCOUNTER — Ambulatory Visit (HOSPITAL_COMMUNITY)
Admission: EM | Admit: 2022-02-24 | Discharge: 2022-02-24 | Disposition: A | Discharge status: Home / Self Care | Payer: Medicaid Other

## 2022-02-24 NOTE — ED Triage Notes (Signed)
Pt presents to [facility] voluntarily seeking an evaluation to renew her medicaid. Pt states she was looking for outpatient. Pt signed the waiver of medical screening exam form. Pt denies SI/HI and AVH.

## 2022-08-06 ENCOUNTER — Ambulatory Visit (HOSPITAL_COMMUNITY)
Admission: EM | Admit: 2022-08-06 | Discharge: 2022-08-06 | Disposition: A | Discharge status: Home / Self Care | Payer: MEDICAID | Attending: Family Medicine | Admitting: Family Medicine

## 2022-08-06 ENCOUNTER — Encounter (HOSPITAL_COMMUNITY): Payer: Self-pay | Admitting: *Deleted

## 2022-08-06 DIAGNOSIS — R42 Dizziness and giddiness: Secondary | ICD-10-CM

## 2022-08-06 DIAGNOSIS — R5383 Other fatigue: Secondary | ICD-10-CM | POA: Diagnosis present

## 2022-08-06 LAB — CBC
HCT: 37.2 % (ref 36.0–46.0)
Hemoglobin: 12.2 g/dL (ref 12.0–15.0)
MCH: 28.8 pg (ref 26.0–34.0)
MCHC: 32.8 g/dL (ref 30.0–36.0)
MCV: 87.9 fL (ref 80.0–100.0)
Platelets: 190 10*3/uL (ref 150–400)
RBC: 4.23 MIL/uL (ref 3.87–5.11)
RDW: 14.9 % (ref 11.5–15.5)
WBC: 6.2 10*3/uL (ref 4.0–10.5)
nRBC: 0 % (ref 0.0–0.2)

## 2022-08-06 LAB — COMPREHENSIVE METABOLIC PANEL
ALT: 9 U/L (ref 0–44)
AST: 17 U/L (ref 15–41)
Albumin: 3 g/dL — ABNORMAL LOW (ref 3.5–5.0)
Alkaline Phosphatase: 37 U/L — ABNORMAL LOW (ref 38–126)
Anion gap: 7 (ref 5–15)
BUN: 15 mg/dL (ref 6–20)
CO2: 23 mmol/L (ref 22–32)
Calcium: 8.5 mg/dL — ABNORMAL LOW (ref 8.9–10.3)
Chloride: 106 mmol/L (ref 98–111)
Creatinine, Ser: 0.83 mg/dL (ref 0.44–1.00)
GFR, Estimated: >60 mL/min (ref >60)
Glucose, Bld: 85 mg/dL (ref 70–99)
Potassium: 4.2 mmol/L (ref 3.5–5.1)
Sodium: 136 mmol/L (ref 135–145)
Total Bilirubin: 0.6 mg/dL (ref 0.3–1.2)
Total Protein: 5.6 g/dL — ABNORMAL LOW (ref 6.5–8.1)

## 2022-08-06 LAB — TSH: TSH: 1.343 u[IU]/mL (ref 0.350–4.500)

## 2022-08-06 LAB — VITAMIN D 25 HYDROXY (VIT D DEFICIENCY, FRACTURES): Vit D, 25-Hydroxy: 33.62 ng/mL (ref 30–100)

## 2022-08-06 LAB — HCG, QUANTITATIVE, PREGNANCY: hCG, Beta Chain, Quant, S: 1 m[IU]/mL (ref <5)

## 2022-08-06 NOTE — Discharge Instructions (Signed)
You have had labs (blood tests) sent today. We will call you with any significant abnormalities or if there is need to begin or change treatment or pursue further follow up.  You may also review your test results online through MyChart. If you do not have a MyChart account, instructions to sign up should be on your discharge paperwork.  

## 2022-08-06 NOTE — ED Triage Notes (Signed)
Pt states she has felt cold/chills for about a week. She did try to go to work today but was sent home when she has a near syncope episode. She states she tried to donate plasma yesterday and was told her iron was low at 32 also that her BP was low. She hasn't taken her BP meds in about 3 days.

## 2022-08-07 NOTE — ED Provider Notes (Signed)
[facility]   [id number] 08/06/22 Arrival Time: 1922  ASSESSMENT & PLAN:  1. Other fatigue   2. Lightheadedness    Possible viral illness with lingering fatigue. Unclear. No significant lab abnormalities.  Results for orders placed or performed during the hospital encounter of 08/06/22  Comprehensive metabolic panel  Result Value Ref Range   Sodium 136 135 - 145 mmol/L   Potassium 4.2 3.5 - 5.1 mmol/L   Chloride 106 98 - 111 mmol/L   CO2 23 22 - 32 mmol/L   Glucose, Bld 85 70 - 99 mg/dL   BUN 15 6 - 20 mg/dL   Creatinine, Ser 9.56 0.44 - 1.00 mg/dL   Calcium 8.5 (L) 8.9 - 10.3 mg/dL   Total Protein 5.6 (L) 6.5 - 8.1 g/dL   Albumin 3.0 (L) 3.5 - 5.0 g/dL   AST 17 15 - 41 U/L   ALT 9 0 - 44 U/L   Alkaline Phosphatase 37 (L) 38 - 126 U/L   Total Bilirubin 0.6 0.3 - 1.2 mg/dL   GFR, Estimated >21 >30 mL/min   Anion gap 7 5 - 15  TSH  Result Value Ref Range   TSH 1.343 0.350 - 4.500 uIU/mL  CBC  Result Value Ref Range   WBC 6.2 4.0 - 10.5 K/uL   RBC 4.23 3.87 - 5.11 MIL/uL   Hemoglobin 12.2 12.0 - 15.0 g/dL   HCT 86.5 78.4 - 69.6 %   MCV 87.9 80.0 - 100.0 fL   MCH 28.8 26.0 - 34.0 pg   MCHC 32.8 30.0 - 36.0 g/dL   RDW 29.5 28.4 - 13.2 %   Platelets 190 150 - 400 K/uL   nRBC 0.0 0.0 - 0.2 %  VITAMIN D 25 Hydroxy (Vit-D Deficiency, Fractures)  Result Value Ref Range   Vit D, 25-Hydroxy 33.62 30 - 100 ng/mL  hCG, quantitative, pregnancy  Result Value Ref Range   hCG, Beta Chain, Quant, S 1 <5 mIU/mL   OTC as needed.   Follow-up Information     Schedule an appointment as soon as possible for a visit  with [facility].   Contact information: [address] [place] [address] [phone]                 Reviewed expectations re: course of current medical issues. Questions answered. Outlined signs and symptoms indicating need for more acute intervention. Understanding verbalized. After Visit Summary  given.   SUBJECTIVE: History from: Patient. Jacqueline Clarke is a 37 y.o. female. Reports feeling very fatigued; past few weeks; gradual onset; initial chills but only lasted few days. Tried to donate plasma yesterday and was told her "iron was low". Denies fever/abd pain. Normal bowel/bladder habits. Normal PO intake without n/v/d.  OBJECTIVE:  Vitals:   08/06/22 2023  BP: 105/71  Pulse: 75  Resp: 18  Temp: 98.6 F (37 C)  TempSrc: Oral  SpO2: 97%    General appearance: alert; no distress Eyes: PERRLA; EOMI; conjunctiva normal HENT: [place]; AT; without nasal congestion Neck: supple  Lungs: speaks full sentences without difficulty; unlabored CV: RRR Extremities: no edema Skin: warm and dry Neurologic: normal gait Psychological: alert and cooperative; normal mood and affect   Labs Reviewed  COMPREHENSIVE METABOLIC PANEL - Abnormal; Notable for the following components:      Result Value   Calcium 8.5 (*)    Total Protein 5.6 (*)    Albumin 3.0 (*)    Alkaline Phosphatase 37 (*)  All other components within normal limits  TSH  CBC  VITAMIN D 25 HYDROXY (VIT D DEFICIENCY, FRACTURES)  HCG, QUANTITATIVE, PREGNANCY     No Known Allergies  Past Medical History:  Diagnosis Date   Anemia    Anxiety    Asthma    Bipolar disorder (HCC)    Blood type, Rh negative    Cholelithiasis    Chronic back pain    scoliosis- on percocet   Depression    Headache(784.0)    History of cocaine use    History of ileus 07/06/2016   After cesarean section   Hyperemesis complicating pregnancy, antepartum 2013   Infection    hx MRSA; 3 negative tests since   Low vitamin D level 01/03/2016   Obesity    Pregnancy induced hypertension    Pyelonephritis 07/03/2016   postpartum   Scoliosis    Scoliosis    Supervision of high risk pregnancy, antepartum 10/07/2017    Nursing Staff Provider Office Location  [facility] Dating   Language  English Anatomy US   Flu Vaccine  Declined  02/02/2018 Genetic Screen  NIPS:   AFP:   First Screen:  Quad:   TDaP vaccine   declined  02/02/2018 Hgb A1C or  GTT Early  Third trimester  Rhogam   02/02/2018   LAB RESULTS  Feeding Plan Breast/Bottle Blood Type O/Negative/-- (09/18 1525) ONeg Contraception BTL? Antibody Negative (09/18   Social History   Socioeconomic History   Marital status: Single    Spouse name: Not on file   Number of children: Not on file   Years of education: Not on file   Highest education level: Not on file  Occupational History   Not on file  Tobacco Use   Smoking status: Former    Current packs/day: 0.00    Types: Cigarettes    Quit date: 08/02/2017    Years since quitting: 5.0   Smokeless tobacco: Never  Vaping Use   Vaping status: Every Day  Substance and Sexual Activity   Alcohol use: Yes    Comment: Occassionally   Drug use: Not Currently    Types: Cocaine    Comment: UDS + 11/22/17   Sexual activity: Yes    Partners: Male    Birth control/protection: None  Other Topics Concern   Not on file  Social History Narrative   ** Merged History Encounter **       ** Merged History Encounter **       Social Determinants of Health   Financial Resource Strain: Not on file  Food Insecurity: Not on file  Transportation Needs: Not on file  Physical Activity: Not on file  Stress: Not on file  Social Connections: Not on file  Intimate Partner Violence: Not on file   Family History  Problem Relation Age of Onset   Heart disease Maternal Grandfather    Diabetes Maternal Grandfather    Heart disease Father    Anesthesia problems Neg Hx    Past Surgical History:  Procedure Laterality Date   arm surgery     CESAREAN SECTION N/A 06/23/2016   Procedure: CESAREAN SECTION;  Surgeon: [place] Bing, MD;  Location: [facility];  Service: Obstetrics;  Laterality: N/A;   CHOLECYSTECTOMY     DILATION AND CURETTAGE OF UTERUS  2008   MR WRIST RIGHT     sx to nerves   NERVE, TENDON AND ARTERY REPAIR  Right 06/18/2014   Procedure: NERVE, TENDON AND ARTERY REPAIR;  Surgeon: Bradly Bienenstock, MD;  Location: [facility] OR;  Service: Orthopedics;  Laterality: Right;   TONSILLECTOMY AND ADENOIDECTOMY     TUBAL LIGATION N/A 04/01/2018   Procedure: POST PARTUM TUBAL LIGATION;  Surgeon: Tereso Newcomer, MD;  Location: [facility];  Service: Gynecology;  Laterality: N/A;   VAGINAL DELIVERY  06/23/2016   Procedure: VAGINAL DELIVERY;  Surgeon: [place] Bing, MD;  Location: [facility]TH[facility] SU[facility];  Service: Obstetrics;;     Mardella Layman, MD 08/07/22 1340

## 2022-12-05 ENCOUNTER — Ambulatory Visit (HOSPITAL_COMMUNITY)
Admission: EM | Admit: 2022-12-05 | Discharge: 2022-12-05 | Disposition: A | Discharge status: Home / Self Care | Payer: MEDICAID | Attending: Emergency Medicine | Admitting: Emergency Medicine

## 2022-12-05 ENCOUNTER — Encounter (HOSPITAL_COMMUNITY): Payer: Self-pay | Admitting: Emergency Medicine

## 2022-12-05 ENCOUNTER — Ambulatory Visit (INDEPENDENT_AMBULATORY_CARE_PROVIDER_SITE_OTHER): Payer: MEDICAID

## 2022-12-05 DIAGNOSIS — J189 Pneumonia, unspecified organism: Secondary | ICD-10-CM | POA: Diagnosis not present

## 2022-12-05 DIAGNOSIS — J45901 Unspecified asthma with (acute) exacerbation: Secondary | ICD-10-CM

## 2022-12-05 MED ORDER — ALBUTEROL SULFATE (2.5 MG/3ML) 0.083% IN NEBU: 2.5000 mg | INHALATION_SOLUTION | Freq: Four times a day (QID) | RESPIRATORY_TRACT | 1 refills | Status: DC | PRN

## 2022-12-05 MED ORDER — ALBUTEROL SULFATE (2.5 MG/3ML) 0.083% IN NEBU
2.5000 mg | INHALATION_SOLUTION | Freq: Once | RESPIRATORY_TRACT | Status: AC
  Administered 2022-12-05: 2.5 mg via RESPIRATORY_TRACT

## 2022-12-05 MED ORDER — AEROCHAMBER MV MISC: 1 refills | Status: AC

## 2022-12-05 MED ORDER — PROMETHAZINE-DM 6.25-15 MG/5ML PO SYRP: 5.0000 mL | ORAL_SOLUTION | Freq: Four times a day (QID) | ORAL | 0 refills | Status: AC | PRN

## 2022-12-05 MED ORDER — ALBUTEROL SULFATE (2.5 MG/3ML) 0.083% IN NEBU
INHALATION_SOLUTION | RESPIRATORY_TRACT | Status: AC
  Filled 2022-12-05: qty 3

## 2022-12-05 MED ORDER — IPRATROPIUM-ALBUTEROL 0.5-2.5 (3) MG/3ML IN SOLN
3.0000 mL | Freq: Once | RESPIRATORY_TRACT | Status: AC
  Administered 2022-12-05: 3 mL via RESPIRATORY_TRACT

## 2022-12-05 MED ORDER — ALBUTEROL SULFATE HFA 108 (90 BASE) MCG/ACT IN AERS: 2.0000 | INHALATION_SPRAY | RESPIRATORY_TRACT | 0 refills | Status: AC | PRN

## 2022-12-05 MED ORDER — AMOXICILLIN-POT CLAVULANATE 875-125 MG PO TABS: 1.0000 | ORAL_TABLET | Freq: Two times a day (BID) | ORAL | 0 refills | Status: AC

## 2022-12-05 MED ORDER — AZITHROMYCIN 250 MG PO TABS: 250.0000 mg | ORAL_TABLET | Freq: Every day | ORAL | 0 refills | Status: AC

## 2022-12-05 MED ORDER — NAPROXEN 500 MG PO TABS: 500.0000 mg | ORAL_TABLET | Freq: Two times a day (BID) | ORAL | 0 refills | Status: AC

## 2022-12-05 MED ORDER — IPRATROPIUM-ALBUTEROL 0.5-2.5 (3) MG/3ML IN SOLN
RESPIRATORY_TRACT | Status: AC
  Filled 2022-12-05: qty 3

## 2022-12-05 NOTE — ED Provider Notes (Signed)
HPI  SUBJECTIVE:  Jacqueline Clarke is a 37 y.o. female who presents with 6 to 7 days of dry cough, fevers Tmax 104, chest soreness, tightness, body aches, posttussive emesis.  Patient states that she was feeling better, but then got worse.  Her family member was recently diagnosed with pneumonia and she is concerned that she has the same.  She reports clear/green rhinorrhea, facial swelling, wheezing, shortness of breath, dyspnea on exertion and easy fatigability.  She is unable to sleep at night because of the cough.  No nasal congestion, postnasal drip, nausea, vomiting, diarrhea, abdominal pain.  No antibiotics in the past 3 months.  No antipyretic in the past 6 hours.  She has tried DayQuil and oxycodone that is prescribed to her for chronic back pain without improvement in her symptoms.  No aggravating or alleviating factors. Patient has a past medical history of asthma, with an admission/intubation.  She does not have an albuterol inhaler at home.  No recent steroid use.  She also has a history of seizures, hypertension, scoliosis, chronic low back pain and is on chronic opiates for this.  LMP: 11/5.  Denies possibility of being pregnant.  PCP: [facility].  Past Medical History:  Diagnosis Date   Anemia    Anxiety    Asthma    Bipolar disorder (HCC)    Blood type, Rh negative    Cholelithiasis    Chronic back pain    scoliosis- on percocet   Depression    Headache(784.0)    History of cocaine use    History of ileus 07/06/2016   After cesarean section   Hyperemesis complicating pregnancy, antepartum 2013   Infection    hx MRSA; 3 negative tests since   Low vitamin D level 01/03/2016   Obesity    Pregnancy induced hypertension    Pyelonephritis 07/03/2016   postpartum   Scoliosis    Scoliosis    Supervision of high risk pregnancy, antepartum 10/07/2017    Nursing Staff Provider Office Location  [facility] Dating   Language  English Anatomy US   Flu Vaccine  Declined  02/02/2018 Genetic Screen  NIPS:   AFP:   First Screen:  Quad:   TDaP vaccine   declined  02/02/2018 Hgb A1C or  GTT Early  Third trimester  Rhogam   02/02/2018   LAB RESULTS  Feeding Plan Breast/Bottle Blood Type O/Negative/-- (09/18 1525) ONeg Contraception BTL? Antibody Negative (09/18    Past Surgical History:  Procedure Laterality Date   arm surgery     CESAREAN SECTION N/A 06/23/2016   Procedure: CESAREAN SECTION;  Surgeon: [place] Bing, MD;  Location: [facility];  Service: Obstetrics;  Laterality: N/A;   CHOLECYSTECTOMY     DILATION AND CURETTAGE OF UTERUS  2008   MR WRIST RIGHT     sx to nerves   NERVE, TENDON AND ARTERY REPAIR Right 06/18/2014   Procedure: NERVE, TENDON AND ARTERY REPAIR;  Surgeon: Bradly Bienenstock, MD;  Location: [facility] OR;  Service: Orthopedics;  Laterality: Right;   TONSILLECTOMY AND ADENOIDECTOMY     TUBAL LIGATION N/A 04/01/2018   Procedure: POST PARTUM TUBAL LIGATION;  Surgeon: Tereso Newcomer, MD;  Location: [facility];  Service: Gynecology;  Laterality: N/A;   VAGINAL DELIVERY  06/23/2016   Procedure: VAGINAL DELIVERY;  Surgeon: [place] Bing, MD;  Location: [facility];  Service: Obstetrics;;    Family History  Problem Relation Age of Onset   Heart disease Maternal Grandfather  Diabetes Maternal Grandfather    Heart disease Father    Anesthesia problems Neg Hx     Social History   Tobacco Use   Smoking status: Former    Current packs/day: 0.00    Types: Cigarettes    Quit date: 08/02/2017    Years since quitting: 5.3   Smokeless tobacco: Never  Vaping Use   Vaping status: Every Day  Substance Use Topics   Alcohol use: Yes    Comment: Occassionally   Drug use: Not Currently    Types: Cocaine    Comment: UDS + 11/22/17    No current facility-administered medications for this encounter.  Current Outpatient Medications:    albuterol (VENTOLIN HFA) 108 (90 Base) MCG/ACT inhaler, Inhale 2 puffs into the lungs every 4 (four) hours  as needed for wheezing or shortness of breath., Disp: 1 each, Rfl: 0   amoxicillin-clavulanate (AUGMENTIN) 875-125 MG tablet, Take 1 tablet by mouth every 12 (twelve) hours for 5 days., Disp: 10 tablet, Rfl: 0   azithromycin (ZITHROMAX) 250 MG tablet, Take 1 tablet (250 mg total) by mouth daily. 2 tabs po on day 1, 1 tab po on days 2-5, Disp: 6 tablet, Rfl: 0   naproxen (NAPROSYN) 500 MG tablet, Take 1 tablet (500 mg total) by mouth 2 (two) times daily., Disp: 20 tablet, Rfl: 0   promethazine-dextromethorphan (PROMETHAZINE-DM) 6.25-15 MG/5ML syrup, Take 5 mLs by mouth 4 (four) times daily as needed for cough., Disp: 118 mL, Rfl: 0   Spacer/Aero-Holding Chambers (AEROCHAMBER MV) inhaler, Use as instructed, Disp: 1 each, Rfl: 1   hydrochlorothiazide (HYDRODIURIL) 50 MG tablet, Take 50 mg by mouth daily., Disp: , Rfl:    NARCAN 4 MG/0.1ML LIQD nasal spray kit, Place 1 spray into the nose once as needed (overdose). , Disp: , Rfl:    oxyCODONE-acetaminophen (PERCOCET) 10-325 MG tablet, Take 1 tablet by mouth 4 (four) times daily as needed for pain. , Disp: , Rfl:    OZEMPIC, 0.25 OR 0.5 MG/DOSE, 2 MG/1.5ML SOPN, Inject 0.25 mg into the skin once a week. Friday (Patient not taking: Reported on 12/05/2022), Disp: , Rfl:    Vitamin D, Ergocalciferol, (DRISDOL) 1.25 MG (50000 UNIT) CAPS capsule, Take 50,000 Units by mouth once a week., Disp: , Rfl:   No Known Allergies   ROS  As noted in HPI.   Physical Exam  BP 125/67 (BP Location: Right Arm)   Pulse 79   Temp 100 F (37.8 C) (Oral)   Resp 17   LMP 11/25/2022 (Exact Date)   SpO2 97%   Constitutional: Well developed, well nourished, coughing.  Appears ill. Eyes:  EOMI, conjunctiva normal bilaterally HENT: Normocephalic, atraumatic,mucus membranes moist.  No nasal congestion.  No maxillary, frontal sinus tenderness. Respiratory: Normal inspiratory effort.  Fair air movement.  Lungs clear bilaterally.  Positive anterior, lateral chest wall  tenderness Cardiovascular: Normal rate, regular rhythm, no murmurs rubs or gallops GI: nondistended, tenderness over the rectus abdominis skin: No rash, skin intact Musculoskeletal: no deformities Neurologic: Alert & oriented x 3, no focal neuro deficits Psychiatric: Speech and behavior appropriate   ED Course   Medications  albuterol (PROVENTIL) (2.5 MG/3ML) 0.083% nebulizer solution 2.5 mg (2.5 mg Nebulization Given 12/05/22 1323)  ipratropium-albuterol (DUONEB) 0.5-2.5 (3) MG/3ML nebulizer solution 3 mL (3 mLs Nebulization Given 12/05/22 1322)    Orders Placed This Encounter  Procedures   DG Chest 2 View    Standing Status:   Standing    Number of Occurrences:  1    Order Specific Question:   Reason for Exam (SYMPTOM  OR DIAGNOSIS REQUIRED)    Answer:   Cough, fever 1 week rule out pneumonia    No results found for this or any previous visit (from the past 24 hour(s)). DG Chest 2 View  Result Date: 12/05/2022 CLINICAL DATA:  Cough and fever for 1 week, rule out pneumonia EXAM: CHEST - 2 VIEW COMPARISON:  10/29/2019, 08/11/2019 FINDINGS: Normal cardiac and mediastinal contours. Heterogeneous opacities in the left upper greater than lower lobes. No pleural effusion or pneumothorax. No acute osseous abnormality. IMPRESSION: Heterogeneous opacities in the left upper greater than lower lobes, concerning for pneumonia. Electronically Signed   By: Wiliam Ke M.D.   On: 12/05/2022 16:33    ED Clinical Impression  1. Community acquired pneumonia of left upper lobe of lung   2. Moderate asthma with acute exacerbation, unspecified whether persistent      ED Assessment/Plan     Concern for pneumonia with double sickening accompanied with an asthma exacerbation.  Will check chest x-ray.  Giving a DuoNeb 0.5/5 mg, will reevaluate.  Reviewed imaging independently.  Suspicious for left upper lobe infiltrate.  Discussed with patient that formal radiology over read is pending, and  we will contact her if overread changes management.   Reviewed radiology report.  Left upper and lower lobe pneumonia.  See radiology report for full details.  On reevaluation post nebulizer, patient states that she feels better.  Improved air movement.  Lungs still clear.  Plan to treat as a community-acquired pneumonia with an asthma exacerbation.  Home with regularly scheduled albuterol inhaler with a spacer for 4 days, then as needed thereafter, prednisone 40 mg for 5 days, Augmentin and azithromycin because she has chronic lung disease.  Naprosyn/Tylenol for body aches, fevers, Promethazine DM as needed for cough.  Follow-up with her PCP.  Strict ER return precautions given.  Discussed  imaging, MDM, treatment plan, and plan for follow-up with patient. Discussed sn/sx that should prompt return to the ED. patient agrees with plan.   Meds ordered this encounter  Medications   albuterol (PROVENTIL) (2.5 MG/3ML) 0.083% nebulizer solution 2.5 mg   ipratropium-albuterol (DUONEB) 0.5-2.5 (3) MG/3ML nebulizer solution 3 mL   DISCONTD: albuterol (PROVENTIL) (2.5 MG/3ML) 0.083% nebulizer solution    Sig: Take 3 mLs (2.5 mg total) by nebulization every 6 (six) hours as needed for wheezing or shortness of breath.    Dispense:  20 mL    Refill:  1   azithromycin (ZITHROMAX) 250 MG tablet    Sig: Take 1 tablet (250 mg total) by mouth daily. 2 tabs po on day 1, 1 tab po on days 2-5    Dispense:  6 tablet    Refill:  0   amoxicillin-clavulanate (AUGMENTIN) 875-125 MG tablet    Sig: Take 1 tablet by mouth every 12 (twelve) hours for 5 days.    Dispense:  10 tablet    Refill:  0   naproxen (NAPROSYN) 500 MG tablet    Sig: Take 1 tablet (500 mg total) by mouth 2 (two) times daily.    Dispense:  20 tablet    Refill:  0   Spacer/Aero-Holding Chambers (AEROCHAMBER MV) inhaler    Sig: Use as instructed    Dispense:  1 each    Refill:  1   promethazine-dextromethorphan (PROMETHAZINE-DM) 6.25-15 MG/5ML  syrup    Sig: Take 5 mLs by mouth 4 (four) times daily as needed for  cough.    Dispense:  118 mL    Refill:  0   albuterol (VENTOLIN HFA) 108 (90 Base) MCG/ACT inhaler    Sig: Inhale 2 puffs into the lungs every 4 (four) hours as needed for wheezing or shortness of breath.    Dispense:  1 each    Refill:  0      *This clinic note was created using Scientist, clinical (histocompatibility and immunogenetics). Therefore, there may be occasional mistakes despite careful proofreading.  ?    Domenick Gong, MD 12/05/22 1722

## 2022-12-05 NOTE — ED Triage Notes (Signed)
Pt c/o cough, fever, chills, and body aches for 4 days. States her fever is worse at night

## 2022-12-05 NOTE — Discharge Instructions (Addendum)
Your chest x-ray is suspicious for pneumonia.  I am treating you with 2 antibiotics because of your asthma.  Make sure you finish them both, even if you feel better.  2 puffs from your albuterol inhaler using your spacer every 4 hours for 2 days, then every 6 hours for 2 days, then as needed.  You can back off on the albuterol if you start to improve sooner.  Prednisone will help with inflammation in your lungs and asthma.  Promethazine DM as needed for cough.  Naprosyn combined with 1000 mg of Tylenol twice a day for body aches, fevers.  Follow-up with your primary care provider in a week to make sure that you are getting better.  Go to the ER if you get worse or for any concerns.
